# Patient Record
Sex: Female | Born: 1950
Health system: Southern US, Community
[De-identification: ages and names within clinical notes are randomized; demographics above are authoritative.]

## PROBLEM LIST (undated history)

## (undated) DIAGNOSIS — Z87440 Personal history of urinary (tract) infections: Secondary | ICD-10-CM

## (undated) DIAGNOSIS — B029 Zoster without complications: Secondary | ICD-10-CM

## (undated) DIAGNOSIS — E119 Type 2 diabetes mellitus without complications: Secondary | ICD-10-CM

## (undated) DIAGNOSIS — R7989 Other specified abnormal findings of blood chemistry: Secondary | ICD-10-CM

## (undated) DIAGNOSIS — Z9221 Personal history of antineoplastic chemotherapy: Secondary | ICD-10-CM

## (undated) DIAGNOSIS — Z9289 Personal history of other medical treatment: Secondary | ICD-10-CM

## (undated) DIAGNOSIS — K219 Gastro-esophageal reflux disease without esophagitis: Secondary | ICD-10-CM

## (undated) DIAGNOSIS — I10 Essential (primary) hypertension: Secondary | ICD-10-CM

## (undated) DIAGNOSIS — C569 Malignant neoplasm of unspecified ovary: Secondary | ICD-10-CM

## (undated) DIAGNOSIS — C481 Malignant neoplasm of specified parts of peritoneum: Principal | ICD-10-CM

## (undated) DIAGNOSIS — E785 Hyperlipidemia, unspecified: Secondary | ICD-10-CM

## (undated) DIAGNOSIS — Z803 Family history of malignant neoplasm of breast: Secondary | ICD-10-CM

## (undated) DIAGNOSIS — Z8709 Personal history of other diseases of the respiratory system: Secondary | ICD-10-CM

## (undated) DIAGNOSIS — I499 Cardiac arrhythmia, unspecified: Secondary | ICD-10-CM

## (undated) HISTORY — DX: Other specified abnormal findings of blood chemistry: R79.89

## (undated) HISTORY — DX: Type 2 diabetes mellitus without complications: E11.9

## (undated) HISTORY — DX: Hyperlipidemia, unspecified: E78.5

## (undated) HISTORY — DX: Malignant neoplasm of unspecified ovary: C56.9

## (undated) HISTORY — DX: Malignant neoplasm of specified parts of peritoneum: C48.1

## (undated) HISTORY — DX: Essential (primary) hypertension: I10

## (undated) HISTORY — DX: Family history of malignant neoplasm of breast: Z80.3

---

## 1998-05-11 ENCOUNTER — Other Ambulatory Visit: Admission: RE | Admit: 1998-05-11 | Discharge: 1998-05-11 | Payer: Self-pay | Admitting: Family Medicine

## 1999-07-31 ENCOUNTER — Other Ambulatory Visit: Admission: RE | Admit: 1999-07-31 | Discharge: 1999-07-31 | Payer: Self-pay | Admitting: Family Medicine

## 2002-05-25 ENCOUNTER — Other Ambulatory Visit: Admission: RE | Admit: 2002-05-25 | Discharge: 2002-05-25 | Payer: Self-pay | Admitting: Family Medicine

## 2006-07-23 ENCOUNTER — Other Ambulatory Visit: Admission: RE | Admit: 2006-07-23 | Discharge: 2006-07-23 | Payer: Self-pay | Admitting: Family Medicine

## 2013-03-06 ENCOUNTER — Emergency Department (HOSPITAL_COMMUNITY): Payer: BC Managed Care – PPO

## 2013-03-06 ENCOUNTER — Emergency Department (HOSPITAL_COMMUNITY)
Admission: EM | Admit: 2013-03-06 | Discharge: 2013-03-06 | Disposition: A | Discharge status: Home / Self Care | Payer: BC Managed Care – PPO | Attending: Emergency Medicine | Admitting: Emergency Medicine

## 2013-03-06 DIAGNOSIS — IMO0002 Reserved for concepts with insufficient information to code with codable children: Secondary | ICD-10-CM | POA: Insufficient documentation

## 2013-03-06 DIAGNOSIS — R0602 Shortness of breath: Secondary | ICD-10-CM | POA: Insufficient documentation

## 2013-03-06 DIAGNOSIS — T18108A Unspecified foreign body in esophagus causing other injury, initial encounter: Secondary | ICD-10-CM | POA: Insufficient documentation

## 2013-03-06 DIAGNOSIS — Y9389 Activity, other specified: Secondary | ICD-10-CM | POA: Insufficient documentation

## 2013-03-06 DIAGNOSIS — Z794 Long term (current) use of insulin: Secondary | ICD-10-CM | POA: Insufficient documentation

## 2013-03-06 DIAGNOSIS — E1169 Type 2 diabetes mellitus with other specified complication: Secondary | ICD-10-CM | POA: Insufficient documentation

## 2013-03-06 DIAGNOSIS — R1310 Dysphagia, unspecified: Secondary | ICD-10-CM | POA: Insufficient documentation

## 2013-03-06 DIAGNOSIS — Y9289 Other specified places as the place of occurrence of the external cause: Secondary | ICD-10-CM | POA: Insufficient documentation

## 2013-03-06 LAB — POCT I-STAT, CHEM 8
BUN: 14 mg/dL (ref 6–23)
Calcium, Ion: 1.21 mmol/L (ref 1.12–1.23)
Chloride: 105 mEq/L (ref 96–112)
Potassium: 4.1 mEq/L (ref 3.5–5.1)

## 2013-03-06 MED ORDER — GLUCAGON HCL (RDNA) 1 MG IJ SOLR
1.0000 mg | Freq: Once | INTRAMUSCULAR | Status: AC
  Administered 2013-03-06: 1 mg via INTRAVENOUS
  Filled 2013-03-06: qty 1

## 2013-03-06 NOTE — ED Notes (Signed)
Pt comfortable with d/c and f/u instructions. No prescriptions 

## 2013-03-06 NOTE — ED Notes (Signed)
Pt states that earlier tonight she was eating steak and a piece got lodge making it difficulty to breath until she coughed it up, but has not been able to swallow water since because it comes back up. Pt denies difficulty breathing at this time.

## 2013-03-06 NOTE — ED Notes (Signed)
Pt tolerating PO fluids well, no episodes of emesis, no difficulty swallowing

## 2013-03-06 NOTE — ED Provider Notes (Signed)
History     CSN: [id number]  Arrival date & time 03/06/13  0057   None     Chief Complaint  Patient presents with  . Swallowed Foreign Body    (Consider location/radiation/quality/duration/timing/severity/associated sxs/prior treatment) Patient is a 62 y.o. female presenting with foreign body swallowed.  Swallowed Foreign Body Associated symptoms include shortness of breath.   Patient reports she was eating steak at 9:30 PM tonight when this piece of steak got stuck in her throat. This patient points to her manubrium and has foreign by sensation at that level. She's had trouble swallowing since then.. She denies other complaint. No treatment prior to coming here. She was able to cough up part of the steak and had transient after coughing up the steak but no longer feels short of breath No past medical history on file. Past medical history diabetes No past surgical history on file.  No family history on file.  History  Substance Use Topics  . Smoking status: Not on file  . Smokeless tobacco: Not on file  . Alcohol Use: Not on file   Tobacco no alcohol no drug OB History   No data available      Review of Systems  Constitutional: Negative.   HENT: Positive for trouble swallowing.   Respiratory: Positive for shortness of breath.   Cardiovascular: Negative.   Gastrointestinal: Negative.   Musculoskeletal: Negative.   Skin: Negative.   Neurological: Negative.   Psychiatric/Behavioral: Negative.   All other systems reviewed and are negative.    Allergies  Avandia and Micronase  Home Medications   Current Outpatient Rx  Name  Route  Sig  Dispense  Refill  . atorvastatin (LIPITOR) 20 MG tablet   Oral   Take 20 mg by mouth daily.         . benazepril (LOTENSIN) 10 MG tablet   Oral   Take 10 mg by mouth daily.         . insulin lispro (HUMALOG) 100 UNIT/ML injection   Subcutaneous   Inject into the skin continuous.         . metFORMIN (GLUCOPHAGE) 1000  MG tablet   Oral   Take 1,000 mg by mouth 2 (two) times daily with a meal.         . Multiple Vitamin (MULTIVITAMIN WITH MINERALS) TABS   Oral   Take 1 tablet by mouth daily.           BP 119/64  Pulse 87  Temp(Src) 97.7 F (36.5 C) (Oral)  Resp 24  SpO2 96%  Physical Exam  Nursing note and vitals reviewed. Constitutional: She appears well-developed and well-nourished.  HENT:  Head: Normocephalic and atraumatic.  Eyes: Conjunctivae are normal. Pupils are equal, round, and reactive to light.  Neck: Neck supple. No tracheal deviation present. No thyromegaly present.  Cardiovascular: Normal rate and regular rhythm.   No murmur heard. Pulmonary/Chest: Effort normal and breath sounds normal.  Abdominal: Soft. Bowel sounds are normal. She exhibits no distension. There is no tenderness.  Obese  Musculoskeletal: Normal range of motion. She exhibits no edema and no tenderness.  Neurological: She is alert. Coordination normal.  Skin: Skin is warm and dry. No rash noted.  Psychiatric: She has a normal mood and affect.    ED Course  Procedures (including critical care time)  Labs Reviewed - No data to display No results found.  4:30 AM Patient was given a soap and water and vomited the water approximately 1 minute  after she attempted to drink No diagnosis found.  5:25 AM patient feels normal and is asymptomatic after treatment with intravenous glucagon. She is able to drink water without difficulty. Chest x-ray viewed by me Results for orders placed during the hospital encounter of 03/06/13  POCT I-STAT, CHEM 8      Result Value Range   Sodium 139  135 - 145 mEq/L   Potassium 4.1  3.5 - 5.1 mEq/L   Chloride 105  96 - 112 mEq/L   BUN 14  6 - 23 mg/dL   Creatinine, Ser 1.61  0.50 - 1.10 mg/dL   Glucose, Bld 096 (*) 70 - 99 mg/dL   Calcium, Ion 0.45  4.09 - 1.23 mmol/L   TCO2 23  0 - 100 mmol/L   Hemoglobin 13.6  12.0 - 15.0 g/dL   HCT 81.1  91.4 - 78.2 %   Dg Chest  Port 1 View  03/06/2013  *RADIOLOGY REPORT*  Clinical Data: Pain, possible foreign body.  PORTABLE CHEST - 1 VIEW  Comparison: None.  Findings: No radiopaque foreign body.  Lungs predominately clear with mild interstitial prominence which may be accentuated by portable technique/patient body habitus rather than pathology.  No pleural effusion or pneumothorax.  Heart size upper normal. Mediastinal contours otherwise within normal range.  Mild multilevel degenerative change.  IMPRESSION:  Mild interstitial prominence may be accentuated by portable technique/patient body habitus versus atypical/viral infection or interstitial edema.  No focal consolidation.   Original Report Authenticated By: Jearld Lesch, M.D.     MDM  Plan gastroenterology referral.pt reports glucose of 167 is her baseline.   Dx #1 esophageal food impaction #2 hyperglycemia       Doug Sou, MD 03/06/13 [phone]

## 2013-12-21 ENCOUNTER — Encounter: Payer: Self-pay | Admitting: Family Medicine

## 2013-12-21 ENCOUNTER — Ambulatory Visit (INDEPENDENT_AMBULATORY_CARE_PROVIDER_SITE_OTHER): Payer: BC Managed Care – PPO | Admitting: Family Medicine

## 2013-12-21 VITALS — BP 143/72 | HR 85 | Temp 97.6°F | Ht 62.0 in | Wt 213.0 lb

## 2013-12-21 DIAGNOSIS — E785 Hyperlipidemia, unspecified: Secondary | ICD-10-CM

## 2013-12-21 DIAGNOSIS — N951 Menopausal and female climacteric states: Secondary | ICD-10-CM

## 2013-12-21 DIAGNOSIS — Z780 Asymptomatic menopausal state: Secondary | ICD-10-CM

## 2013-12-21 DIAGNOSIS — E119 Type 2 diabetes mellitus without complications: Secondary | ICD-10-CM

## 2013-12-21 DIAGNOSIS — I10 Essential (primary) hypertension: Secondary | ICD-10-CM

## 2013-12-21 LAB — POCT CBC
Granulocyte percent: 79.2 %G (ref 37–80)
HCT, POC: 39 % (ref 37.7–47.9)
Hemoglobin: 12.6 g/dL (ref 12.2–16.2)
Lymph, poc: 0.7 (ref 0.6–3.4)
MCH, POC: 28 pg (ref 27–31.2)
MCHC: 32.2 g/dL (ref 31.8–35.4)
MCV: 87 fL (ref 80–97)
MPV: 8.6 fL (ref 0–99.8)
POC Granulocyte: 4 (ref 2–6.9)
POC LYMPH PERCENT: 14.1 %L (ref 10–50)
Platelet Count, POC: 218 10*3/uL (ref 142–424)
RBC: 4.5 M/uL (ref 4.04–5.48)
RDW, POC: 13.6 %
WBC: 5 10*3/uL (ref 4.6–10.2)

## 2013-12-21 LAB — POCT GLYCOSYLATED HEMOGLOBIN (HGB A1C): Hemoglobin A1C: 8

## 2013-12-21 NOTE — Progress Notes (Signed)
   Subjective:    Patient ID: Ellen Hunt, female    DOB: 12/28/1950, 63 y.o.   MRN: [id number]  HPI  This 63 y.o. female presents for evaluation of re-establish and diabetes.  She has hx of hyperlipidemiai And hypertension.  She has been having difficulties with LEE in the past. She has been seeing endocrinologist and is on ain insulin pump.  She has fsbs fasting in the 150's and 170's.    Review of Systems    No chest pain, SOB, HA, dizziness, vision change, N/V, diarrhea, constipation, dysuria, urinary urgency or frequency, myalgias, arthralgias or rash.  Objective:   Physical Exam  Vital signs noted  Well developed well nourished female.  HEENT - Head atraumatic Normocephalic                Eyes - PERRLA, Conjuctiva - clear Sclera- Clear EOMI                Ears - EAC's Wnl TM's Wnl Gross Hearing WNL                Nose - Nares patent                 Throat - oropharanx wnl Respiratory - Lungs CTA bilateral Cardiac - RRR S1 and S2 without murmur GI - Abdomen soft Nontender and bowel sounds active x 4 Extremities - No edema. Neuro - Grossly intact.      Assessment & Plan:  Diabetes - Plan: POCT glycosylated hemoglobin (Hb A1C), POCT CBC, Lipid panel, TSH, CMP14+EGFR.  Follow up with Tammy Eckard Pharm D.  Other and unspecified hyperlipidemia - Plan: Vit D  25 hydroxy (rtn osteoporosis monitoring)  Unspecified essential hypertension - Plan: POCT CBC, CMP14+EGFR  Menopause - Plan: DG Bone Density  Lysbeth Penner FNP

## 2013-12-22 ENCOUNTER — Other Ambulatory Visit: Payer: Self-pay | Admitting: Family Medicine

## 2013-12-22 LAB — LIPID PANEL
Chol/HDL Ratio: 3.7 ratio units (ref 0.0–4.4)
Cholesterol, Total: 186 mg/dL (ref 100–199)
HDL: 50 mg/dL (ref >39)
LDL Calculated: 104 mg/dL — ABNORMAL HIGH (ref 0–99)
Triglycerides: 159 mg/dL — ABNORMAL HIGH (ref 0–149)
VLDL Cholesterol Cal: 32 mg/dL (ref 5–40)

## 2013-12-22 LAB — CMP14+EGFR
ALT: 26 IU/L (ref 0–32)
AST: 19 IU/L (ref 0–40)
Albumin/Globulin Ratio: 1.9 (ref 1.1–2.5)
Albumin: 4.4 g/dL (ref 3.6–4.8)
Alkaline Phosphatase: 105 IU/L (ref 39–117)
BUN/Creatinine Ratio: 31 — ABNORMAL HIGH (ref 11–26)
BUN: 22 mg/dL (ref 8–27)
CO2: 23 mmol/L (ref 18–29)
Calcium: 9.7 mg/dL (ref 8.7–10.3)
Chloride: 102 mmol/L (ref 97–108)
Creatinine, Ser: 0.72 mg/dL (ref 0.57–1.00)
GFR calc Af Amer: 105 mL/min/{1.73_m2} (ref >59)
GFR calc non Af Amer: 91 mL/min/{1.73_m2} (ref >59)
Globulin, Total: 2.3 g/dL (ref 1.5–4.5)
Glucose: 167 mg/dL — ABNORMAL HIGH (ref 65–99)
Potassium: 4.8 mmol/L (ref 3.5–5.2)
Sodium: 140 mmol/L (ref 134–144)
Total Bilirubin: 0.3 mg/dL (ref 0.0–1.2)
Total Protein: 6.7 g/dL (ref 6.0–8.5)

## 2013-12-22 LAB — VITAMIN D 25 HYDROXY (VIT D DEFICIENCY, FRACTURES): Vit D, 25-Hydroxy: 21.3 ng/mL — ABNORMAL LOW (ref 30.0–100.0)

## 2013-12-22 LAB — TSH: TSH: 0.82 u[IU]/mL (ref 0.450–4.500)

## 2013-12-22 MED ORDER — VITAMIN D (ERGOCALCIFEROL) 1.25 MG (50000 UNIT) PO CAPS: 50000.0000 [IU] | ORAL_CAPSULE | ORAL | Status: DC

## 2014-01-23 ENCOUNTER — Encounter: Payer: Self-pay | Admitting: Family Medicine

## 2014-01-25 ENCOUNTER — Encounter: Payer: Self-pay | Admitting: Pharmacist

## 2014-01-25 ENCOUNTER — Ambulatory Visit (INDEPENDENT_AMBULATORY_CARE_PROVIDER_SITE_OTHER): Payer: BC Managed Care – PPO

## 2014-01-25 ENCOUNTER — Ambulatory Visit (INDEPENDENT_AMBULATORY_CARE_PROVIDER_SITE_OTHER): Payer: BC Managed Care – PPO | Admitting: Pharmacist

## 2014-01-25 VITALS — BP 140/70 | HR 80 | Ht 62.0 in | Wt 214.0 lb

## 2014-01-25 DIAGNOSIS — E669 Obesity, unspecified: Secondary | ICD-10-CM

## 2014-01-25 DIAGNOSIS — E119 Type 2 diabetes mellitus without complications: Secondary | ICD-10-CM

## 2014-01-25 DIAGNOSIS — M949 Disorder of cartilage, unspecified: Secondary | ICD-10-CM

## 2014-01-25 DIAGNOSIS — E118 Type 2 diabetes mellitus with unspecified complications: Secondary | ICD-10-CM | POA: Insufficient documentation

## 2014-01-25 DIAGNOSIS — M8580 Other specified disorders of bone density and structure, unspecified site: Secondary | ICD-10-CM | POA: Insufficient documentation

## 2014-01-25 DIAGNOSIS — E559 Vitamin D deficiency, unspecified: Secondary | ICD-10-CM

## 2014-01-25 DIAGNOSIS — E1169 Type 2 diabetes mellitus with other specified complication: Secondary | ICD-10-CM

## 2014-01-25 DIAGNOSIS — Z9641 Presence of insulin pump (external) (internal): Secondary | ICD-10-CM

## 2014-01-25 DIAGNOSIS — M899 Disorder of bone, unspecified: Secondary | ICD-10-CM

## 2014-01-25 DIAGNOSIS — N951 Menopausal and female climacteric states: Secondary | ICD-10-CM

## 2014-01-25 DIAGNOSIS — Z780 Asymptomatic menopausal state: Secondary | ICD-10-CM

## 2014-01-25 DIAGNOSIS — E785 Hyperlipidemia, unspecified: Secondary | ICD-10-CM

## 2014-01-25 MED ORDER — ROSUVASTATIN CALCIUM 10 MG PO TABS: 10.0000 mg | ORAL_TABLET | Freq: Every day | ORAL | Status: DC

## 2014-01-25 NOTE — Patient Instructions (Signed)
Stop atorvastatin 40mg  Start Crestor 10mg  1 tablet daily (I gave you 20mg  tablets - you can take 1 every other day) - prescription has been sent to [facility]    Hypoglycemia (Low Blood Sugar) Hypoglycemia is when the glucose (sugar) in your blood is too low. Hypoglycemia can happen for many reasons. It can happen to people with or without diabetes. Hypoglycemia can develop quickly and can be a medical emergency.  CAUSES  Having hypoglycemia does not mean that you will develop diabetes. Different causes include:  Missed or delayed meals or not enough carbohydrates eaten.  Medication overdose. This could be by accident or deliberate. If by accident, your medication may need to be adjusted or changed.  Exercise or increased activity without adjustments in carbohydrates or medications.  A nerve disorder that affects body functions like your heart rate, blood pressure and digestion (autonomic neuropathy).  A condition where the stomach muscles do not function properly (gastroparesis). Therefore, medications may not absorb properly.  The inability to recognize the signs of hypoglycemia (hypoglycemic unawareness).  Absorption of insulin  may be altered.  Alcohol consumption.  Pregnancy/menstrual cycles/postpartum. This may be due to hormones.  Certain kinds of tumors. This is very rare. SYMPTOMS   Sweating.  Hunger.  Dizziness.  Blurred vision.  Drowsiness.  Weakness.  Headache.  Rapid heart beat.  Shakiness.  Nervousness. DIAGNOSIS  Diagnosis is made by monitoring blood glucose in one or all of the following ways:  Fingerstick blood glucose monitoring.  Laboratory results. TREATMENT  If you think your blood glucose is low:  Check your blood glucose, if possible. If it is less than 70 mg/dl, take one of the following:  3-4 glucose tablets.   cup juice (prefer clear like apple).   cup "regular" soda pop.  1 cup milk.  -1 tube of glucose gel.  5-6 hard  candies.  Do not over treat because your blood glucose (sugar) will only go too high.  Wait 15 minutes and recheck your blood glucose. If it is still less than 70 mg/dl (or below your target range), repeat treatment.  Eat a snack if it is more than one hour until your next meal. Sometimes, your blood glucose may go so low that you are unable to treat yourself. You may need someone to help you. You may even pass out or be unable to swallow. This may require you to get an injection of glucagon, which raises the blood glucose. HOME CARE INSTRUCTIONS  Check blood glucose as recommended by your caregiver.  Take medication as prescribed by your caregiver.  Follow your meal plan. Do not skip meals. Eat on time.  If you are going to drink alcohol, drink it only with meals.  Check your blood glucose before driving.  Check your blood glucose before and after exercise. If you exercise longer or different than usual, be sure to check blood glucose more frequently.  Always carry treatment with you. Glucose tablets are the easiest to carry.  Always wear medical alert jewelry or carry some form of identification that states that you have diabetes. This will alert people that you have diabetes. If you have hypoglycemia, they will have a better idea on what to do. SEEK MEDICAL CARE IF:   You are having problems keeping your blood sugar at target range.  You are having frequent episodes of hypoglycemia.  You feel you might be having side effects from your medicines.  You have symptoms of an illness that is not improving after  3-4 days.  You notice a change in vision or a new problem with your vision. SEEK IMMEDIATE MEDICAL CARE IF:   You are a family member or friend of a person whose blood glucose goes below 70 mg/dl and is accompanied by:  Confusion.  A change in mental status.  The inability to swallow.  Passing out. Document Released: 10/20/2005 Document Revised: 01/12/2012 Document  Reviewed: 02/16/2012 [facility] Patient Information 2014 [place], [place].

## 2014-01-25 NOTE — Progress Notes (Signed)
Patient ID: Ellen Hunt, female   DOB: [birth year], 63 y.o.   MRN: [id number]   Osteoporosis [facility] Will also address elevated BG and lipids   HPI: First DEXA Does pt already have a diagnosis of:  Osteopenia?  No Osteoporosis?  No Back Pain?  No       Kyphosis?  No Prior fracture?  No Med(s) for Osteoporosis/Osteopenia:  None Med(s) previously tried for Osteoporosis/Osteopenia:  None  Patient is using insulin pump and taking metforin 100mg  BID - suboptimal control She also has elevated Tg and LDL (slightly) dispite compliance with current therapy for hyperlipidmia of atorvastatin 40mg  daily. She feels that leg / muscle pain has increased since atorvastatin increased from 20mg  to 40mg .                                                             PMH: Age at menopause:  89's Hysterectomy?  No Oophorectomy?  No HRT? No Steroid Use?  No Thyroid med?  No History of cancer?  No History of digestive disorders (ie Crohn's)?  No Current or previous eating disorders?  No Last Vitamin D Result:  21.3 (12/22/2013) Last GFR Result:  91 (12/22/2013) A1c = 8.0% (12/21/2013) Current Insulin Pump Settings:        Basal:  MN to 2am = 2.4                    2am to 8am = 3.1                    8am to 5pm = 2.3                    5pm to MN = 2.8       Insulin to CHO ratio = changes between 4 to 5       Insulin Sensitivity = 13       FH/SH: Family history of osteoporosis?  Yes - mother and maternal grandmother Parent with history of hip fracture?  No Family history of breast cancer?  Yes - paternal aunt Exercise?  A little walking at school / work Smoking?  No Alcohol?  No    Calcium Assessment Calcium Intake  # of servings/day  Calcium mg  Milk (8 oz) 0  x  300  = 0  Yogurt (4 oz) 1 x  200 = 200mg   Cheese (1 oz) 0 x  200 = 0  Other Calcium sources   250mg   Ca supplement MVI = 400mg    Estimated calcium intake per day 850mg     DEXA Results Date of Test T-Score for AP Spine L1-L4  T-Score for Total Left Hip T-Score for Total Right Hip  01/25/2014 0.7 0.1 0.3       ** T-Score of neck of  Left hip = -2.4           FRAX 10 year estimate: Total FX risk:  10%  (consider medication if >/= 20%) Hip FX risk:  1.7%  (consider medication if >/= 3%)  Assessment: Uncontrolled type 2DM Osteopenia  Low Vitamin D Hyperlipidemia - not at goals and possibly experiencing SE to atorvastatin  Recommendations: 1.  Changed Insulin Pump Settings:        Basal:  MN to 2am = 2.5  2am to 8am = 3.1                    8am to 5pm = 2.35                    5pm to MN = 2.9       Insulin to CHO ratio = changes between 4 to 5       Insulin Sensitivity = 13  2.  recommend calcium 1200mg  daily through supplementation or diet. Very important to continue Vitamin D supplementation since was found to be low.  3.  recommend weight bearing exercise - 30 minutes at least 4 days per week.   4.  Counseled and educated about fall risk and prevention. 5.  Discontinue atorvastatin and start Crestor 10mg  1 tablet daily  Recheck DEXA:  2 years  Time spent counseling patient:  35 minutes  Cherre Robins, PharmD, CPP

## 2014-04-03 ENCOUNTER — Ambulatory Visit: Payer: BC Managed Care – PPO

## 2014-04-21 ENCOUNTER — Ambulatory Visit (INDEPENDENT_AMBULATORY_CARE_PROVIDER_SITE_OTHER): Payer: BC Managed Care – PPO | Admitting: Pharmacist

## 2014-04-21 ENCOUNTER — Encounter: Payer: Self-pay | Admitting: Pharmacist

## 2014-04-21 VITALS — BP 128/72 | HR 80 | Ht 62.0 in | Wt 213.0 lb

## 2014-04-21 DIAGNOSIS — E1169 Type 2 diabetes mellitus with other specified complication: Secondary | ICD-10-CM

## 2014-04-21 DIAGNOSIS — E669 Obesity, unspecified: Principal | ICD-10-CM

## 2014-04-21 DIAGNOSIS — E785 Hyperlipidemia, unspecified: Secondary | ICD-10-CM

## 2014-04-21 DIAGNOSIS — E119 Type 2 diabetes mellitus without complications: Secondary | ICD-10-CM

## 2014-04-21 DIAGNOSIS — E559 Vitamin D deficiency, unspecified: Secondary | ICD-10-CM

## 2014-04-21 LAB — POCT GLYCOSYLATED HEMOGLOBIN (HGB A1C): HEMOGLOBIN A1C: 8.4

## 2014-04-21 MED ORDER — CANAGLIFLOZIN 100 MG PO TABS: 1.0000 | ORAL_TABLET | Freq: Every day | ORAL | Status: DC

## 2014-04-21 MED ORDER — INSULIN ASPART 100 UNIT/ML ~~LOC~~ SOLN: SUBCUTANEOUS | Status: DC

## 2014-04-21 MED ORDER — BENAZEPRIL HCL 10 MG PO TABS: 10.0000 mg | ORAL_TABLET | Freq: Every day | ORAL | Status: DC

## 2014-04-21 MED ORDER — ASPIRIN 81 MG PO TABS: 81.0000 mg | ORAL_TABLET | Freq: Every day | ORAL | Status: DC

## 2014-04-21 NOTE — Patient Instructions (Signed)
Start Invokana 100mg  take 1 tablet each morning Continue metformin 1000mg  twice a day with food  Canagliflozin oral tablets What is this medicine? CANAGLIFLOZIN (KAN a gli FLOE zin) helps to treat type 2 diabetes. It helps to control blood sugar. Treatment is combined with diet and exercise. This medicine may be used for other purposes; ask your health care provider or pharmacist if you have questions. COMMON BRAND NAME(S): Invokana What should I tell my health care provider before I take this medicine? They need to know if you have any of these conditions: -dehydration -diabetic ketoacidosis -diet low in salt -high cholesterol -high levels of potassium in the blood -history of yeast infection of the penis or vagina -kidney disease -liver disease -low blood pressure -on hemodialysis -type 1 diabetes -uncircumcised female -an unusual or allergic reaction to canagliflozin, other medicines, foods, dyes, or preservatives -pregnant or trying to get pregnant -breast-feeding How should I use this medicine? Take this medicine by mouth with a glass of water. Follow the directions on the prescription label. Take it before the first meal of the day. Take your dose at the same time each day. Do not take more often than directed. Do not stop taking except on your doctor's advice. A special MedGuide will be given to you by the pharmacist with each prescription and refill. Be sure to read this information carefully each time. Talk to your pediatrician regarding the use of this medicine in children. Special care may be needed. Overdosage: If you think you've taken too much of this medicine contact a poison control center or emergency room at once. Overdosage: If you think you have taken too much of this medicine contact a poison control center or emergency room at once. NOTE: This medicine is only for you. Do not share this medicine with others. What if I miss a dose? If you miss a dose, take it as  soon as you can. If it is almost time for your next dose, take only that dose. Do not take double or extra doses. What may interact with this medicine? Do not take this medicine with any of the following medications: -gatifloxacinThis medicine may also interact with the following medications: -alcohol -certain medicines for blood pressure, heart disease -digoxin -diuretics -insulin -nateglinide -phenobarbital -phenytoin -repaglinide -rifampin -ritonavir -sulfonylureas like glimepiride, glipizide, glyburide This list may not describe all possible interactions. Give your health care provider a list of all the medicines, herbs, non-prescription drugs, or dietary supplements you use. Also tell them if you smoke, drink alcohol, or use illegal drugs. Some items may interact with your medicine. What should I watch for while using this medicine? Visit your doctor or health care professional for regular checks on your progress. A test called the HbA1C (A1C) will be monitored. This is a simple blood test. It measures your blood sugar control over the last 2 to 3 months. You will receive this test every 3 to 6 months. Learn how to check your blood sugar. Learn the symptoms of low and high blood sugar and how to manage them. Always carry a quick-source of sugar with you in case you have symptoms of low blood sugar. Examples include hard sugar candy or glucose tablets. Make sure others know that you can choke if you eat or drink when you develop serious symptoms of low blood sugar, such as seizures or unconsciousness. They must get medical help at once. Tell your doctor or health care professional if you have high blood sugar. You might need  to change the dose of your medicine. If you are sick or exercising more than usual, you might need to change the dose of your medicine. Do not skip meals. Ask your doctor or health care professional if you should avoid alcohol. Many nonprescription cough and cold  products contain sugar or alcohol. These can affect blood sugar. Wear a medical ID bracelet or chain, and carry a card that describes your disease and details of your medicine and dosage times. What side effects may I notice from receiving this medicine? Side effects that you should report to your doctor or health care professional as soon as possible: -allergic reactions like skin rash, itching or hives, swelling of the face, lips, or tongue -breathing problems -chest pain -dizziness -fast or irregular heartbeat -feeling faint or lightheaded, falls -fever, chills -muscle weakness -signs and symptoms of low blood sugar such as feeling anxious, confusion, dizziness, increased hunger, unusually weak or tired, sweating, shakiness, cold, irritable, headache, blurred vision, fast heartbeat, loss of consciousness -trouble passing urine or change in the amount of urine -penile discharge, itching, or pain in men -vaginal discharge, itching, or odor in women Side effects that usually do not require medical attention (Report these to your doctor or health care professional if they continue or are bothersome.): -constipation -increased urination -nausea -thirsty This list may not describe all possible side effects. Call your doctor for medical advice about side effects. You may report side effects to FDA at 1-800-FDA-1088. Where should I keep my medicine? Keep out of the reach of children. Store at room temperature between 20 and 25 degrees C (68 and 77 degrees F). Throw away any unused medicine after the expiration date. NOTE: This sheet is a summary. It may not cover all possible information. If you have questions about this medicine, talk to your doctor, pharmacist, or health care provider.  2015, Elsevier/Gold Standard. (2013-02-02 14:08:06)     Hypoglycemia Hypoglycemia occurs when the glucose in your blood is too low. Glucose is a type of sugar that is your body's main energy source.  Hormones, such as insulin and glucagon, control the level of glucose in the blood. Insulin lowers blood glucose and glucagon increases blood glucose. Having too much insulin in your blood stream, or not eating enough food containing sugar, can result in hypoglycemia. Hypoglycemia can happen to people with or without diabetes. It can develop quickly and can be a medical emergency.  CAUSES   Missing or delaying meals.  Not eating enough carbohydrates at meals.  Taking too much diabetes medicine.  Not timing your oral diabetes medicine or insulin doses with meals, snacks, and exercise.  Nausea and vomiting.  Certain medicines.  Severe illnesses, such as hepatitis, kidney disorders, and certain eating disorders.  Increased activity or exercise without eating something extra or adjusting medicines.  Drinking too much alcohol.  A nerve disorder that affects body functions like your heart rate, blood pressure, and digestion (autonomic neuropathy).  A condition where the stomach muscles do not function properly (gastroparesis). Therefore, medicines and food may not absorb properly.  Rarely, a tumor of the pancreas can produce too much insulin. SYMPTOMS   Hunger.  Sweating (diaphoresis).  Change in body temperature.  Shakiness.  Headache.  Anxiety.  Lightheadedness.  Irritability.  Difficulty concentrating.  Dry mouth.  Tingling or numbness in the hands or feet.  Restless sleep or sleep disturbances.  Altered speech and coordination.  Change in mental status.  Seizures or prolonged convulsions.  Combativeness.  Drowsiness (lethargic).  Weakness.  Increased heart rate or palpitations.  Confusion.  Pale, gray skin color.  Blurred or double vision.  Fainting. DIAGNOSIS  A physical exam and medical history will be performed. Your caregiver may make a diagnosis based on your symptoms. Blood tests and other lab tests may be performed to confirm a diagnosis.  Once the diagnosis is made, your caregiver will see if your signs and symptoms go away once your blood glucose is raised.  TREATMENT  Usually, you can easily treat your hypoglycemia when you notice symptoms.  Check your blood glucose. If it is less than 70 mg/dl, take one of the following:   3-4 glucose tablets.    cup juice.    cup regular soda.   1 cup skim milk.   -1 tube of glucose gel.   5-6 hard candies.   Avoid high-fat drinks or food that may delay a rise in blood glucose levels.  Do not take more than the recommended amount of sugary foods, drinks, gel, or tablets. Doing so will cause your blood glucose to go too high.   Wait 10-15 minutes and recheck your blood glucose. If it is still less than 70 mg/dl or below your target range, repeat treatment.   Eat a snack if it is more than 1 hour until your next meal.  There may be a time when your blood glucose may go so low that you are unable to treat yourself at home when you start to notice symptoms. You may need someone to help you. You may even faint or be unable to swallow. If you cannot treat yourself, someone will need to bring you to the hospital.  [place]  If you have diabetes, follow your diabetes management plan by:  Taking your medicines as directed.  Following your exercise plan.  Following your meal plan. Do not skip meals. Eat on time.  Testing your blood glucose regularly. Check your blood glucose before and after exercise. If you exercise longer or different than usual, be sure to check blood glucose more frequently.  Wearing your medical alert jewelry that says you have diabetes.  Identify the cause of your hypoglycemia. Then, develop ways to prevent the recurrence of hypoglycemia.  Do not take a hot bath or shower right after an insulin shot.  Always carry treatment with you. Glucose tablets are the easiest to carry.  If you are going to drink alcohol, drink it only with  meals.  Tell friends or family members ways to keep you safe during a seizure. This may include removing hard or sharp objects from the area or turning you on your side.  Maintain a healthy weight. SEEK MEDICAL CARE IF:   You are having problems keeping your blood glucose in your target range.  You are having frequent episodes of hypoglycemia.  You feel you might be having side effects from your medicines.  You are not sure why your blood glucose is dropping so low.  You notice a change in vision or a new problem with your vision. SEEK IMMEDIATE MEDICAL CARE IF:   Confusion develops.  A change in mental status occurs.  The inability to swallow develops.  Fainting occurs. Document Released: 10/20/2005 Document Revised: 10/25/2013 Document Reviewed: 02/16/2012 [facility] Patient Information 2015 [place], [place]. This information is not intended to replace advice given to you by your health care provider. Make sure you discuss any questions you have with your health care provider.

## 2014-04-21 NOTE — Progress Notes (Signed)
Diabetes Follow-Up Visit Chief Complaint:   Chief Complaint  Patient presents with  . Diabetes     Filed Vitals:   04/21/14 1030  BP: 128/72  Pulse: 80     HPI: patient last seen 3 months ago for uncontrolled DM.  She is currently using medtronic insulin pump Paradigm 722. Uses Novolog insulin in pump.  ALso take metformin 1000mg  bid with food.  She asks today about new medications she has seen advertised on TV recently  Current Insulin Pump Settings:        Basal:  MN to 2am = 2.5                    2am to 8am = 3.1                    8am to 5pm = 2.35                    5pm to MN = 2.9       Insulin to CHO ratio = changes between 4 to 5       Insulin Sensitivity = 13  Home BG Monitoring:  Checking 4 times a day. Average:  233  High: 400  Low:  103  Low fat/carbohydrate diet?  No Nicotine Abuse?  No Medication Compliance?  Yes Exercise?  No Alcohol Abuse?  No  BMI:  Body mass index is 38.95 kg/(m^2).   Weight changes:  stable General Appearance:  obese Mood/Affect:  normal   Lab Results  Component Value Date   HGBA1C 8.4 04/21/2014    No results found for this basenameDerl Barrow    Lab Results  Component Value Date   HDL 50 12/21/2013   LDLCALC 104* 12/21/2013   TRIG 159* 12/21/2013   CHOLHDL 3.7 12/21/2013      Assessment: 1.  Diabetes.  uncontrolled 2.  Blood Pressure.  At goal today 3.  Lipids.  LDL elevated and Tg elevated at last check  Recommendations: 1.  Medication recommendations at this time are as follows:    Add Invokana 100mg  1 table qam  Change Insulin Pump Settings per below         Basal:  MN to 2am = 2.6                    2am to 8am = 3.15                    8am to 5pm = 2.35                    5pm to MN = 2.9       Insulin to CHO ratio = changes between 4 to 5       Insulin Sensitivity = 13  2.  Reviewed HBG goals:  Fasting 80-130 and 1-2 hour post prandial <180.  Patient is instructed to check BG 4 times per day.     3.  BP goal < 140/85. 4.  LDL goal of < 100, HDL > 40 and TG < 150. 5.  Eye Exam yearly and Dental Exam every 6 months. 6.  Dietary recommendations:  Reviewed CHO counting principles and serving size recommendations 7.  Physical Activity recommendations:  Start daily exercise - goal 150 minutes per week 8.  Return to clinic in 4-6 wks - recheck BMET   Time spent counseling patient:  40 minutes  Cherre Robins, PharmD, CPP. CDE

## 2014-04-22 LAB — CMP14+EGFR
A/G RATIO: 1.8 (ref 1.1–2.5)
ALT: 25 IU/L (ref 0–32)
AST: 23 IU/L (ref 0–40)
Albumin: 4.2 g/dL (ref 3.6–4.8)
Alkaline Phosphatase: 95 IU/L (ref 39–117)
BUN/Creatinine Ratio: 26 (ref 11–26)
BUN: 18 mg/dL (ref 8–27)
CALCIUM: 10 mg/dL (ref 8.7–10.3)
CO2: 23 mmol/L (ref 18–29)
Chloride: 99 mmol/L (ref 97–108)
Creatinine, Ser: 0.68 mg/dL (ref 0.57–1.00)
GFR calc Af Amer: 110 mL/min/{1.73_m2} (ref >59)
GFR, EST NON AFRICAN AMERICAN: 95 mL/min/{1.73_m2} (ref >59)
Globulin, Total: 2.3 g/dL (ref 1.5–4.5)
Glucose: 200 mg/dL — ABNORMAL HIGH (ref 65–99)
POTASSIUM: 5.1 mmol/L (ref 3.5–5.2)
SODIUM: 136 mmol/L (ref 134–144)
Total Bilirubin: <0.2 mg/dL (ref 0.0–1.2)
Total Protein: 6.5 g/dL (ref 6.0–8.5)

## 2014-04-22 LAB — NMR, LIPOPROFILE
Cholesterol: 175 mg/dL (ref 100–199)
HDL CHOLESTEROL BY NMR: 52 mg/dL (ref >39)
HDL Particle Number: 40.7 umol/L (ref >=30.5)
LDL Particle Number: 961 nmol/L (ref <1000)
LDL Size: 20 nm (ref >20.5)
LDLC SERPL CALC-MCNC: 71 mg/dL (ref 0–99)
LP-IR Score: 59 — ABNORMAL HIGH (ref <=45)
SMALL LDL PARTICLE NUMBER: 669 nmol/L — AB (ref <=527)
Triglycerides by NMR: 258 mg/dL — ABNORMAL HIGH (ref 0–149)

## 2014-04-22 LAB — MICROALBUMIN, URINE: MICROALBUM., U, RANDOM: 23.3 ug/mL — AB (ref 0.0–17.0)

## 2014-04-22 LAB — VITAMIN D 25 HYDROXY (VIT D DEFICIENCY, FRACTURES): VIT D 25 HYDROXY: 25.8 ng/mL — AB (ref 30.0–100.0)

## 2014-04-28 ENCOUNTER — Ambulatory Visit: Payer: BC Managed Care – PPO

## 2014-05-01 ENCOUNTER — Encounter: Payer: Self-pay | Admitting: Pharmacist

## 2014-05-01 ENCOUNTER — Other Ambulatory Visit: Payer: Self-pay | Admitting: Pharmacist

## 2014-05-01 MED ORDER — VITAMIN D (ERGOCALCIFEROL) 1.25 MG (50000 UNIT) PO CAPS: 50000.0000 [IU] | ORAL_CAPSULE | ORAL | Status: DC

## 2014-05-03 NOTE — Telephone Encounter (Signed)
My Chart message was not read by patient so called with recent lab results.  Discussed results with patient.  She was advised to continue current medications.  Follow up as planned in about 2-4 weeks.

## 2014-05-22 ENCOUNTER — Ambulatory Visit: Payer: Self-pay

## 2014-07-11 ENCOUNTER — Telehealth (HOSPITAL_COMMUNITY): Payer: Self-pay | Admitting: Physical Therapy

## 2014-07-11 ENCOUNTER — Ambulatory Visit (HOSPITAL_COMMUNITY)
Admission: RE | Admit: 2014-07-11 | Payer: BC Managed Care – PPO | Source: Ambulatory Visit | Admitting: Physical Therapy

## 2014-07-13 ENCOUNTER — Telehealth: Payer: Self-pay | Admitting: Pharmacist

## 2014-07-13 MED ORDER — METFORMIN HCL 1000 MG PO TABS: 1000.0000 mg | ORAL_TABLET | Freq: Two times a day (BID) | ORAL | Status: DC

## 2014-07-13 NOTE — Telephone Encounter (Signed)
Looks like metformin was skipped when all other meds refilled.  Rx sent to [facility]  Patient notified.

## 2014-07-24 ENCOUNTER — Telehealth: Payer: Self-pay | Admitting: Family Medicine

## 2014-07-24 MED ORDER — GLUCOSE BLOOD VI STRP: ORAL_STRIP | Status: DC

## 2014-07-24 NOTE — Telephone Encounter (Signed)
Patient was concerned about her Novolog having another physicians name on it.  Our records show that it came from myself and would only have one of our providers name on it.  If it does not then this is something that has gone wrong at the [facility]. Patient also needed rx sent for One Touch Test Strips - done.

## 2014-08-09 ENCOUNTER — Ambulatory Visit (INDEPENDENT_AMBULATORY_CARE_PROVIDER_SITE_OTHER): Payer: BC Managed Care – PPO | Admitting: Family

## 2014-08-09 ENCOUNTER — Encounter: Payer: Self-pay | Admitting: Family

## 2014-08-09 VITALS — BP 163/76 | HR 82 | Temp 98.4°F | Ht 62.0 in | Wt 228.0 lb

## 2014-08-09 DIAGNOSIS — M5432 Sciatica, left side: Secondary | ICD-10-CM

## 2014-08-09 MED ORDER — KETOROLAC TROMETHAMINE 60 MG/2ML IM SOLN
60.0000 mg | Freq: Once | INTRAMUSCULAR | Status: AC
  Administered 2014-08-09: 60 mg via INTRAMUSCULAR

## 2014-08-09 MED ORDER — CYCLOBENZAPRINE HCL 10 MG PO TABS: 10.0000 mg | ORAL_TABLET | Freq: Three times a day (TID) | ORAL | Status: DC | PRN

## 2014-08-09 MED ORDER — MELOXICAM 15 MG PO TABS: 15.0000 mg | ORAL_TABLET | Freq: Every day | ORAL | Status: DC

## 2014-08-09 NOTE — Patient Instructions (Signed)
Sciatica Sciatica is pain, weakness, numbness, or tingling along the path of the sciatic nerve. The nerve starts in the lower back and runs down the back of each leg. The nerve controls the muscles in the lower leg and in the back of the knee, while also providing sensation to the back of the thigh, lower leg, and the sole of your foot. Sciatica is a symptom of another medical condition. For instance, nerve damage or certain conditions, such as a herniated disk or bone spur on the spine, pinch or put pressure on the sciatic nerve. This causes the pain, weakness, or other sensations normally associated with sciatica. Generally, sciatica only affects one side of the body. CAUSES   Herniated or slipped disc.  Degenerative disk disease.  A pain disorder involving the narrow muscle in the buttocks (piriformis syndrome).  Pelvic injury or fracture.  Pregnancy.  Tumor (rare). SYMPTOMS  Symptoms can vary from mild to very severe. The symptoms usually travel from the low back to the buttocks and down the back of the leg. Symptoms can include:  Mild tingling or dull aches in the lower back, leg, or hip.  Numbness in the back of the calf or sole of the foot.  Burning sensations in the lower back, leg, or hip.  Sharp pains in the lower back, leg, or hip.  Leg weakness.  Severe back pain inhibiting movement. These symptoms may get worse with coughing, sneezing, laughing, or prolonged sitting or standing. Also, being overweight may worsen symptoms. DIAGNOSIS  Your caregiver will perform a physical exam to look for common symptoms of sciatica. He or she may ask you to do certain movements or activities that would trigger sciatic nerve pain. Other tests may be performed to find the cause of the sciatica. These may include:  Blood tests.  X-rays.  Imaging tests, such as an MRI or CT scan. TREATMENT  Treatment is directed at the cause of the sciatic pain. Sometimes, treatment is not necessary  and the pain and discomfort goes away on its own. If treatment is needed, your caregiver may suggest:  Over-the-counter medicines to relieve pain.  Prescription medicines, such as anti-inflammatory medicine, muscle relaxants, or narcotics.  Applying heat or ice to the painful area.  Steroid injections to lessen pain, irritation, and inflammation around the nerve.  Reducing activity during periods of pain.  Exercising and stretching to strengthen your abdomen and improve flexibility of your spine. Your caregiver may suggest losing weight if the extra weight makes the back pain worse.  Physical therapy.  Surgery to eliminate what is pressing or pinching the nerve, such as a bone spur or part of a herniated disk. HOME CARE INSTRUCTIONS   Only take over-the-counter or prescription medicines for pain or discomfort as directed by your caregiver.  Apply ice to the affected area for 20 minutes, 3-4 times a day for the first 48-72 hours. Then try heat in the same way.  Exercise, stretch, or perform your usual activities if these do not aggravate your pain.  Attend physical therapy sessions as directed by your caregiver.  Keep all follow-up appointments as directed by your caregiver.  Do not wear high heels or shoes that do not provide proper support.  Check your mattress to see if it is too soft. A firm mattress may lessen your pain and discomfort. SEEK IMMEDIATE MEDICAL CARE IF:   You lose control of your bowel or bladder (incontinence).  You have increasing weakness in the lower back, pelvis, buttocks,   or legs.  You have redness or swelling of your back.  You have a burning sensation when you urinate.  You have pain that gets worse when you lie down or awakens you at night.  Your pain is worse than you have experienced in the past.  Your pain is lasting longer than 4 weeks.  You are suddenly losing weight without reason. MAKE SURE YOU:  Understand these  instructions.  Will watch your condition.  Will get help right away if you are not doing well or get worse. Document Released: 10/14/2001 Document Revised: 04/20/2012 Document Reviewed: 02/29/2012 [employer] Patient Information 2015 [employer]. This information is not intended to replace advice given to you by your health care provider. Make sure you discuss any questions you have with your health care provider.  

## 2014-08-09 NOTE — Progress Notes (Signed)
   Subjective:    Patient ID: Ellen Hunt, female    DOB: Dec 04, 1950, 63 y.o.   MRN: [id number]  Back Pain This is a recurrent problem. The current episode started 1 to 4 weeks ago. The problem occurs constantly. The problem has been waxing and waning since onset. The pain is present in the gluteal. The pain radiates to the right thigh. The pain is at a severity of 10/10. The pain is moderate. The symptoms are aggravated by standing. Associated symptoms include leg pain and tingling. Pertinent negatives include no bladder incontinence, bowel incontinence, dysuria, headaches or numbness. She has tried muscle relaxant, NSAIDs and analgesics for the symptoms. The treatment provided mild relief.      Review of Systems  Constitutional: Negative.   HENT: Negative.   Eyes: Negative.   Respiratory: Negative.  Negative for shortness of breath.   Cardiovascular: Negative.  Negative for palpitations.  Gastrointestinal: Negative.  Negative for bowel incontinence.  Endocrine: Negative.   Genitourinary: Negative.  Negative for bladder incontinence and dysuria.  Musculoskeletal: Positive for back pain.  Neurological: Positive for tingling. Negative for numbness and headaches.  Hematological: Negative.   Psychiatric/Behavioral: Negative.   All other systems reviewed and are negative.      Objective:   Physical Exam  Vitals reviewed. Constitutional: She is oriented to person, place, and time. She appears well-developed and well-nourished. No distress.  Eyes: Pupils are equal, round, and reactive to light.  Neck: Normal range of motion. Neck supple. No thyromegaly present.  Cardiovascular: Normal rate, regular rhythm, normal heart sounds and intact distal pulses.   No murmur heard. Pulmonary/Chest: Effort normal and breath sounds normal. No respiratory distress. She has no wheezes.  Abdominal: Soft. Bowel sounds are normal. She exhibits no distension. There is no tenderness.  Musculoskeletal:  Normal range of motion. She exhibits no edema and no tenderness.  Limited ROM with bending r/t pain   Neurological: She is alert and oriented to person, place, and time. She has normal reflexes. No cranial nerve deficit.  Skin: Skin is warm and dry.  Psychiatric: She has a normal mood and affect. Her behavior is normal. Judgment and thought content normal.    BP 163/76  Pulse 82  Temp(Src) 98.4 F (36.9 C) (Oral)  Ht _0  (1.575 m)  Wt 228 lb (103.42 kg)  BMI 41.69 kg/m2       Assessment & Plan:  1. Sciatica, left -Rest -Ice and heat -Sedation precaution discussed -No other NSAID's while taking Mobic - cyclobenzaprine (FLEXERIL) 10 MG tablet; Take 1 tablet (10 mg total) by mouth 3 (three) times daily as needed for muscle spasms.  Dispense: 30 tablet; Refill: 0 - meloxicam (MOBIC) 15 MG tablet; Take 1 tablet (15 mg total) by mouth daily.  Dispense: 30 tablet; Refill: 0 - ketorolac (TORADOL) injection 60 mg; Inject 2 mLs (60 mg total) into the muscle once. - BMP8+EGFR  Evelina Dun, FNP

## 2014-08-10 ENCOUNTER — Ambulatory Visit: Payer: BC Managed Care – PPO

## 2014-08-11 LAB — BMP8+EGFR
BUN/Creatinine Ratio: 26 (ref 11–26)
BUN: 20 mg/dL (ref 8–27)
CALCIUM: 10.1 mg/dL (ref 8.7–10.3)
CHLORIDE: 96 mmol/L — AB (ref 97–108)
CO2: 23 mmol/L (ref 18–29)
CREATININE: 0.78 mg/dL (ref 0.57–1.00)
GFR calc Af Amer: 95 mL/min/{1.73_m2} (ref >59)
GFR calc non Af Amer: 82 mL/min/{1.73_m2} (ref >59)
Glucose: 243 mg/dL — ABNORMAL HIGH (ref 65–99)
POTASSIUM: 4.7 mmol/L (ref 3.5–5.2)
SODIUM: 137 mmol/L (ref 134–144)

## 2014-08-14 ENCOUNTER — Encounter: Payer: Self-pay | Admitting: Pharmacist

## 2014-08-14 ENCOUNTER — Ambulatory Visit (INDEPENDENT_AMBULATORY_CARE_PROVIDER_SITE_OTHER): Payer: BC Managed Care – PPO | Admitting: Family Medicine

## 2014-08-14 VITALS — BP 135/85 | HR 100 | Temp 96.6°F | Ht 62.0 in | Wt 223.0 lb

## 2014-08-14 DIAGNOSIS — E1169 Type 2 diabetes mellitus with other specified complication: Secondary | ICD-10-CM

## 2014-08-14 DIAGNOSIS — E1165 Type 2 diabetes mellitus with hyperglycemia: Secondary | ICD-10-CM

## 2014-08-14 DIAGNOSIS — E559 Vitamin D deficiency, unspecified: Secondary | ICD-10-CM

## 2014-08-14 DIAGNOSIS — IMO0001 Reserved for inherently not codable concepts without codable children: Secondary | ICD-10-CM

## 2014-08-14 DIAGNOSIS — E785 Hyperlipidemia, unspecified: Secondary | ICD-10-CM

## 2014-08-14 DIAGNOSIS — E119 Type 2 diabetes mellitus without complications: Secondary | ICD-10-CM

## 2014-08-14 DIAGNOSIS — M5430 Sciatica, unspecified side: Secondary | ICD-10-CM

## 2014-08-14 DIAGNOSIS — R809 Proteinuria, unspecified: Secondary | ICD-10-CM

## 2014-08-14 DIAGNOSIS — E669 Obesity, unspecified: Principal | ICD-10-CM

## 2014-08-14 DIAGNOSIS — R635 Abnormal weight gain: Secondary | ICD-10-CM

## 2014-08-14 LAB — POCT UA - MICROALBUMIN: Microalbumin Ur, POC: 20 mg/L

## 2014-08-14 LAB — POCT GLYCOSYLATED HEMOGLOBIN (HGB A1C): Hemoglobin A1C: 8.8

## 2014-08-14 MED ORDER — HYDROCODONE-ACETAMINOPHEN 5-325 MG PO TABS: 1.0000 | ORAL_TABLET | Freq: Four times a day (QID) | ORAL | Status: DC | PRN

## 2014-08-14 NOTE — Addendum Note (Signed)
Addended by: Pollyann Kennedy F on: 08/14/2014 11:59 AM   Modules accepted: Orders

## 2014-08-14 NOTE — Progress Notes (Signed)
Diabetes Follow-Up Visit Chief Complaint:   No chief complaint on file.    There were no vitals filed for this visit.   HPI: patient last seen 3 months ago for uncontrolled DM.  She is currently using medtronic insulin pump Paradigm 722. Uses Novolog insulin in pump.  ALso take metformin 1000mg  bid with food.  She asks today about new medications she has seen advertised on TV recently  Current Insulin Pump Settings:        Basal:  MN to 2am = 2.5                    2am to 8am = 3.1                    8am to 5pm = 2.35                    5pm to MN = 2.9       Insulin to CHO ratio = changes between 4 to 5       Insulin Sensitivity = 13  Home BG Monitoring:  Checking 4 times a day. Average:  233  High: 400  Low:  103  Low fat/carbohydrate diet?  No Nicotine Abuse?  No Medication Compliance?  Yes Exercise?  No Alcohol Abuse?  No  BMI:  There is no weight on file to calculate BMI.   Weight changes:  stable General Appearance:  obese Mood/Affect:  normal   Lab Results  Component Value Date   HGBA1C 8.4 04/21/2014    No results found for this basenameDerl Hunt    Lab Results  Component Value Date   CHOL 175 04/21/2014   HDL 52 04/21/2014   LDLCALC 71 04/21/2014   TRIG 258* 04/21/2014   CHOLHDL 3.7 12/21/2013      Assessment: 1.  Diabetes.  uncontrolled 2.  Blood Pressure.  At goal today 3.  Lipids.  LDL elevated and Tg elevated at last check  Recommendations: 1.  Medication recommendations at this time are as follows:    Add Invokana 100mg  1 table qam  Change Insulin Pump Settings per below         Basal:  MN to 2am = 2.6                    2am to 8am = 3.15                    8am to 5pm = 2.35                    5pm to MN = 2.9       Insulin to CHO ratio = changes between 4 to 5       Insulin Sensitivity = 13  2.  Reviewed HBG goals:  Fasting 80-130 and 1-2 hour post prandial <180.  Patient is instructed to check BG 4 times per day.    3.  BP goal  < 140/85. 4.  LDL goal of < 100, HDL > 40 and TG < 150. 5.  Eye Exam yearly and Dental Exam every 6 months. 6.  Dietary recommendations:  Reviewed CHO counting principles and serving size recommendations 7.  Physical Activity recommendations:  Start daily exercise - goal 150 minutes per week 8.  Return to clinic in 4-6 wks - recheck BMET   Time spent counseling patient:  40 minutes   Cherre Robins, PharmD,  CPP. CDE
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
## 2014-08-15 ENCOUNTER — Other Ambulatory Visit: Payer: Self-pay | Admitting: Pharmacist

## 2014-08-15 LAB — HEPATIC FUNCTION PANEL
ALBUMIN: 4.3 g/dL (ref 3.6–4.8)
ALK PHOS: 91 IU/L (ref 39–117)
ALT: 35 IU/L — ABNORMAL HIGH (ref 0–32)
AST: 35 IU/L (ref 0–40)
BILIRUBIN DIRECT: 0.1 mg/dL (ref 0.00–0.40)
BILIRUBIN TOTAL: 0.3 mg/dL (ref 0.0–1.2)
Total Protein: 6.9 g/dL (ref 6.0–8.5)

## 2014-08-15 LAB — LIPID PANEL
CHOL/HDL RATIO: 3.8 ratio (ref 0.0–4.4)
Cholesterol, Total: 158 mg/dL (ref 100–199)
HDL: 42 mg/dL (ref >39)
LDL Calculated: 66 mg/dL (ref 0–99)
Triglycerides: 248 mg/dL — ABNORMAL HIGH (ref 0–149)
VLDL Cholesterol Cal: 50 mg/dL — ABNORMAL HIGH (ref 5–40)

## 2014-08-15 LAB — MICROALBUMIN / CREATININE URINE RATIO
Creatinine, Ur: 65.9 mg/dL (ref 15.0–278.0)
MICROALB/CREAT RATIO: 13.5 mg/g{creat} (ref 0.0–30.0)
Microalbumin, Urine: 8.9 ug/mL (ref 0.0–17.0)

## 2014-08-15 LAB — VITAMIN D 25 HYDROXY (VIT D DEFICIENCY, FRACTURES): VIT D 25 HYDROXY: 43.8 ng/mL (ref 30.0–100.0)

## 2014-08-15 LAB — LDL CHOLESTEROL, DIRECT: LDL Direct: 79 mg/dL (ref 0–99)

## 2014-08-16 ENCOUNTER — Telehealth: Payer: Self-pay | Admitting: Family Medicine

## 2014-08-16 NOTE — Telephone Encounter (Signed)
Called in,pt aware 

## 2014-08-18 ENCOUNTER — Telehealth: Payer: Self-pay | Admitting: Pharmacist

## 2014-08-18 NOTE — Telephone Encounter (Signed)
A1c and LDL have increased since last checked. Patient had stopped invokana due to concerns with side effects and because she had started low CHO diet.  Need to come in to discuss better BG control and medication management. Tried to call to discuss labs and make appt - no answer / LMOVM.

## 2014-08-18 NOTE — Telephone Encounter (Signed)
Returning TBE call.  Call her back at [phone]

## 2014-08-21 ENCOUNTER — Encounter: Payer: Self-pay | Admitting: Pharmacist

## 2014-08-21 NOTE — Telephone Encounter (Signed)
Patient is really surprised that A1c and Tg is worse.  She has been getting better HBG readings (in the 150's mostly) Appointment made to recheck DM - 09/04/14. Patient to bring in BG readings.

## 2014-09-04 ENCOUNTER — Ambulatory Visit (INDEPENDENT_AMBULATORY_CARE_PROVIDER_SITE_OTHER): Payer: BC Managed Care – PPO | Admitting: Pharmacist

## 2014-09-04 ENCOUNTER — Encounter: Payer: Self-pay | Admitting: Pharmacist

## 2014-09-04 VITALS — BP 148/82 | HR 78 | Ht 62.0 in | Wt 227.0 lb

## 2014-09-04 DIAGNOSIS — I10 Essential (primary) hypertension: Secondary | ICD-10-CM

## 2014-09-04 DIAGNOSIS — E119 Type 2 diabetes mellitus without complications: Secondary | ICD-10-CM

## 2014-09-04 DIAGNOSIS — E669 Obesity, unspecified: Secondary | ICD-10-CM | POA: Insufficient documentation

## 2014-09-04 DIAGNOSIS — E1169 Type 2 diabetes mellitus with other specified complication: Secondary | ICD-10-CM

## 2014-09-04 DIAGNOSIS — E785 Hyperlipidemia, unspecified: Secondary | ICD-10-CM

## 2014-09-04 MED ORDER — VITAMIN D 1000 UNITS PO CAPS: 1000.0000 [IU] | ORAL_CAPSULE | Freq: Every day | ORAL | Status: DC

## 2014-09-04 NOTE — Patient Instructions (Signed)
Diabetes and Standards of Medical Care   Diabetes is complicated. You may find that your diabetes team includes a dietitian, nurse, diabetes educator, eye doctor, and more. To help everyone know what is going on and to help you get the care you deserve, the following schedule of care was developed to help keep you on track. Below are the tests, exams, vaccines, medicines, education, and plans you will need.  Blood Glucose Goals Prior to meals = 80 - 130 Within 2 hours of the start of a meal = less than 180  HbA1c test (goal is less than 7.0% - your last value was 8.8%) This test shows how well you have controlled your glucose over the past 2 to 3 months. It is used to see if your diabetes management plan needs to be adjusted.   It is performed at least 2 times a year if you are meeting treatment goals.  It is performed 4 times a year if therapy has changed or if you are not meeting treatment goals.  Blood pressure test  This test is performed at every routine medical visit. The goal is less than 140/90 mmHg for most people, but 130/80 mmHg in some cases. Ask your health care provider about your goal.  Dental exam  Follow up with the dentist regularly.  Eye exam  If you are diagnosed with type 1 diabetes as a child, get an exam upon reaching the age of 10 years or older and have had diabetes for 3 to 5 years. Yearly eye exams are recommended after that initial eye exam.  If you are diagnosed with type 1 diabetes as an adult, get an exam within 5 years of diagnosis and then yearly.  If you are diagnosed with type 2 diabetes, get an exam as soon as possible after the diagnosis and then yearly.  Foot care exam  Visual foot exams are performed at every routine medical visit. The exams check for cuts, injuries, or other problems with the feet.  A comprehensive foot exam should be done yearly. This includes visual inspection as well as assessing foot pulses and testing for loss of  sensation.  Check your feet nightly for cuts, injuries, or other problems with your feet. Tell your health care provider if anything is not healing.  Kidney function test (urine microalbumin)  This test is performed once a year.  Type 1 diabetes: The first test is performed 5 years after diagnosis.  Type 2 diabetes: The first test is performed at the time of diagnosis.  A serum creatinine and estimated glomerular filtration rate (eGFR) test is done once a year to assess the level of chronic kidney disease (CKD), if present.  Lipid profile (cholesterol, HDL, LDL, triglycerides)  Performed every 5 years for most people.  The goal for LDL is less than 100 mg/dL. If you are at high risk, the goal is less than 70 mg/dL.  The goal for HDL is 40 mg/dL to 50 mg/dL for men and 50 mg/dL to 60 mg/dL for women. An HDL cholesterol of 60 mg/dL or higher gives some protection against heart disease.  The goal for triglycerides is less than 150 mg/dL.  Influenza vaccine, pneumococcal vaccine, and hepatitis B vaccine  The influenza vaccine is recommended yearly.  The pneumococcal vaccine is generally given once in a lifetime. However, there are some instances when another vaccination is recommended. Check with your health care provider.  The hepatitis B vaccine is also recommended for adults with diabetes.    Diabetes self-management education  Education is recommended at diagnosis and ongoing as needed.  Treatment plan  Your treatment plan is reviewed at every medical visit.  Document Released: 08/17/2009 Document Revised: 06/22/2013 Document Reviewed: 03/22/2013 [employer] Patient Information 2014 [employer].   

## 2014-09-04 NOTE — Addendum Note (Signed)
Addended by: Cherre Robins on: 09/04/2014 04:47 PM   Modules accepted: Orders, Medications

## 2014-09-04 NOTE — Progress Notes (Signed)
Diabetes Follow-Up Visit  Chief Complaint:   Chief Complaint  Patient presents with  . Diabetes  . Hyperlipidemia     Filed Vitals:   09/04/14 1235  BP: 148/82  Pulse: 78     HPI: patient last seen 4 months ago for uncontrolled DM.  She is currently using medtronic insulin pump Paradigm 722. Uses Novolog insulin in pump.  Also takes metformin 1000mg  bid with food.  She had been prescribed Invokana 100mg  1 tablet daily about 3 months ago but she stopped after only 1-2 weeks because she "felt funny" and thought it could be invokana.  She reports that her BG was around 140 when she was taking Invokana.  Between now and her last visit she also started a low CHO / high protein diet in which she was drinking a protein shake daily.  She did notice that the protein shake has a lot of CHO's so she has now changed how she prepares the shake and has decreased the amount of fruit she puts in.  Current Insulin Pump Settings:        Basal:  MN to 2am = 2.00                    2am to 8am = 2.15                    8am to 5pm = 2.35                    5pm to MN = 2.50       Insulin to CHO ratio = changes between 4 to 5       Insulin Sensitivity = 13  Average total daily insulin = 115 +/- 18 48% basal  52% bolus  Home BG Monitoring:  Checking 4 to  times a day. Average:  222  High: 400  Low:  118   Low fat/carbohydrate diet?  Yes Nicotine Abuse?  No Medication Compliance?  Yes Exercise?  No Alcohol Abuse?  No  BMI:  Body mass index is 41.51 kg/(m^2).   Weight changes:  Increased 4# since 08/14/2014 General Appearance:  obese Mood/Affect:  normal   Lab Results  Component Value Date   HGBA1C 8.8% 08/14/2014    No results found for: [facility]  Lab Results  Component Value Date   CHOL 175 04/21/2014   HDL 42 08/14/2014   LDLCALC 66 08/14/2014   LDLDIRECT 79 08/14/2014   TRIG 248* 08/14/2014   CHOLHDL 3.8 08/14/2014      Assessment: 1.  Diabetes.  uncontrolled 2.  Blood  Pressure.  Elevated today 3.  Lipids.  LDL at goal and Tg elevated at last check  Recommendations: 1.  Medication recommendations at this time are as follows:    Retry Invokana 100mg  1 table qam  Insulin Pump Settings - Only changed CHO Ration today         Basal:  MN to 2am = 2.6                    2am to 8am = 3.15                    8am to 5pm = 2.35                    5pm to MN = 2.9       Insulin to CHO ratio = changed to 5 all  day       Insulin Sensitivity = 13  2.  Reviewed HBG goals:  Fasting 80-130 and 1-2 hour post prandial <180.  Patient is instructed to check BG 4 to 5 times per day.    3.  BP goal < 140/85. 4.  LDL goal of < 100, HDL > 40 and TG < 150. 5.  Eye Exam yearly and Dental Exam every 6 months. 6.  Dietary recommendations:  Reviewed CHO counting principles and serving size recommendations 7.  Physical Activity recommendations:  Start daily exercise - goal 150 minutes per week 8.  Return to clinic in 4 wks - recheck BMET   Time spent counseling patient:  60 minutes   Cherre Robins, PharmD, CPP. CDE

## 2014-10-03 ENCOUNTER — Telehealth: Payer: Self-pay | Admitting: Family Medicine

## 2014-10-04 ENCOUNTER — Other Ambulatory Visit: Payer: Self-pay | Admitting: Family Medicine

## 2014-10-04 DIAGNOSIS — M5430 Sciatica, unspecified side: Secondary | ICD-10-CM

## 2014-10-04 MED ORDER — HYDROCODONE-ACETAMINOPHEN 5-325 MG PO TABS: 1.0000 | ORAL_TABLET | Freq: Four times a day (QID) | ORAL | Status: DC | PRN

## 2014-10-04 NOTE — Telephone Encounter (Signed)
Please review and advise.

## 2014-10-12 ENCOUNTER — Ambulatory Visit: Payer: Self-pay

## 2014-10-12 ENCOUNTER — Ambulatory Visit (INDEPENDENT_AMBULATORY_CARE_PROVIDER_SITE_OTHER): Payer: BC Managed Care – PPO | Admitting: Pharmacist

## 2014-10-12 ENCOUNTER — Encounter: Payer: Self-pay | Admitting: Pharmacist

## 2014-10-12 VITALS — BP 144/70 | HR 78 | Ht 62.0 in | Wt 223.0 lb

## 2014-10-12 DIAGNOSIS — Z794 Long term (current) use of insulin: Secondary | ICD-10-CM

## 2014-10-12 DIAGNOSIS — E119 Type 2 diabetes mellitus without complications: Secondary | ICD-10-CM

## 2014-10-12 DIAGNOSIS — E1169 Type 2 diabetes mellitus with other specified complication: Secondary | ICD-10-CM

## 2014-10-12 DIAGNOSIS — I10 Essential (primary) hypertension: Secondary | ICD-10-CM

## 2014-10-12 DIAGNOSIS — E785 Hyperlipidemia, unspecified: Secondary | ICD-10-CM

## 2014-10-12 MED ORDER — ATORVASTATIN CALCIUM 40 MG PO TABS: 40.0000 mg | ORAL_TABLET | Freq: Every day | ORAL | Status: DC

## 2014-10-12 MED ORDER — GLUCOSE BLOOD VI STRP: ORAL_STRIP | Status: DC

## 2014-10-12 MED ORDER — METFORMIN HCL 1000 MG PO TABS: 1000.0000 mg | ORAL_TABLET | Freq: Two times a day (BID) | ORAL | Status: DC

## 2014-10-12 MED ORDER — BENAZEPRIL HCL 10 MG PO TABS: 10.0000 mg | ORAL_TABLET | Freq: Every day | ORAL | Status: DC

## 2014-10-12 MED ORDER — CANAGLIFLOZIN 100 MG PO TABS: 100.0000 mg | ORAL_TABLET | Freq: Every day | ORAL | Status: DC

## 2014-10-12 NOTE — Progress Notes (Signed)
Diabetes Follow-Up Visit  Chief Complaint:   Chief Complaint  Patient presents with  . Diabetes     Filed Vitals:   10/12/14 1211  BP: 144/70  Pulse: 78     HPI: patient last seen 1 month ago for uncontrolled DM.  She is currently using medtronic insulin pump Paradigm 722. Uses Novolog insulin in pump.  Also takes metformin 1059m bid with food.  She had been prescribed Invokana 1070m1 tablet daily about 4 months ago but she stopped after only 1-2 weeks because she "felt funny" and thought it could be invokana.  At our last visit she had agreed to retry.    Current Insulin Pump Settings:        Basal:  MN to 2am = 2.00                    2am to 8am = 2.15                    8am to 5pm = 2.35                    5pm to MN = 2.50       Insulin to CHO ratio =  5       Insulin Sensitivity = 13  Average total daily insulin = 107 +/- 21.1 51% basal  49% bolus  Home BG Monitoring:  Checking 4 to  times a day. Average:  211  High: 342  Low:  124   Low fat/carbohydrate diet?  Yes Nicotine Abuse?  No Medication Compliance?  Yes Exercise?  No Alcohol Abuse?  No  BMI:  Body mass index is 40.78 kg/(m^2).    Weight changes:  Decreased 4# since 09/2014 General Appearance:  obese Mood/Affect:  normal   Lab Results  Component Value Date   HGBA1C 8.8% 08/14/2014    [facility]: [facility]  Component Value Date   CHOL 175 04/21/2014   HDL 42 08/14/2014   LDLCALC 66 08/14/2014   LDLDIRECT 79 08/14/2014   TRIG 248* 08/14/2014   CHOLHDL 3.8 08/14/2014      Assessment: 1.  Diabetes.  Uncontrolled but improving 2.  Blood Pressure.  At goal today 3.  Lipids.  LDL at goal and Tg elevated at last check 4.  Obesity - has lost 4# and trying to follow low CHO / caloric diet  Recommendations: 1.  Medication recommendations at this time are as follows:    Continue Invokana 10074m table qam - checking BMET today - may consider increasing to 300m69mily of  serum creatinine stable  Insulin Pump Settings - Only changed CHO Ration today         Basal:  MN to 2am = 2.05                    2am to 8am = 2.20                    8am to 5pm = 2.40                    5pm to MN = 2.55       Insulin to CHO ratio = 5       Insulin Sensitivity = 13  2.  Reviewed HBG goals:  Fasting 80-130 and 1-2 hour post prandial <180.  Patient is instructed to check BG 4 to 5  times per day.    3.  BP goal < 140/85. 4.  LDL goal of < 100, HDL > 40 and TG < 150. 5.  Eye Exam yearly and Dental Exam every 6 months. 6.  Dietary recommendations:  Reviewed CHO counting principles and serving size recommendations 7.  Physical Activity recommendations:  Start daily exercise - goal 150 minutes per week 8.  Return to clinic in 4 wks   Orders Placed This Encounter  Procedures  . BMP8+EGFR    Time spent counseling patient:  40 minutes   Cherre Robins, PharmD, CPP. CDE

## 2014-10-13 LAB — BMP8+EGFR
BUN / CREAT RATIO: 18 (ref 11–26)
BUN: 15 mg/dL (ref 8–27)
CHLORIDE: 101 mmol/L (ref 97–108)
CO2: 22 mmol/L (ref 18–29)
Calcium: 9.7 mg/dL (ref 8.7–10.3)
Creatinine, Ser: 0.85 mg/dL (ref 0.57–1.00)
GFR calc Af Amer: 86 mL/min/{1.73_m2} (ref >59)
GFR, EST NON AFRICAN AMERICAN: 74 mL/min/{1.73_m2} (ref >59)
Glucose: 156 mg/dL — ABNORMAL HIGH (ref 65–99)
POTASSIUM: 4.8 mmol/L (ref 3.5–5.2)
SODIUM: 140 mmol/L (ref 134–144)

## 2014-10-18 ENCOUNTER — Telehealth: Payer: Self-pay | Admitting: Pharmacist

## 2014-10-18 MED ORDER — CANAGLIFLOZIN 100 MG PO TABS
100.0000 mg | ORAL_TABLET | Freq: Every day | ORAL | Status: DC
Start: 2014-10-18 — End: 2014-10-19

## 2014-10-18 NOTE — Telephone Encounter (Signed)
All labs were normal except elevated glucose which was actually improved compared to previous.  Recommend continue Invokana 100mg  1 tablet daily Follow up diabetes in January 2016 Patient notfied of labs and appt

## 2014-10-19 ENCOUNTER — Telehealth: Payer: Self-pay | Admitting: Pharmacist

## 2014-10-19 MED ORDER — CANAGLIFLOZIN 100 MG PO TABS: 100.0000 mg | ORAL_TABLET | Freq: Every day | ORAL | Status: DC

## 2014-10-19 NOTE — Telephone Encounter (Signed)
Rx resent today

## 2014-10-30 ENCOUNTER — Telehealth: Payer: Self-pay | Admitting: Family Medicine

## 2014-10-30 NOTE — Telephone Encounter (Signed)
Patient aware, hydrocodone was filled Dec. 2 and can not be filled again until after Jan 2.

## 2014-11-06 ENCOUNTER — Other Ambulatory Visit: Payer: Self-pay | Admitting: Family Medicine

## 2014-11-06 ENCOUNTER — Telehealth: Payer: Self-pay | Admitting: Family Medicine

## 2014-11-06 DIAGNOSIS — M5430 Sciatica, unspecified side: Secondary | ICD-10-CM

## 2014-11-06 MED ORDER — HYDROCODONE-ACETAMINOPHEN 5-325 MG PO TABS: 1.0000 | ORAL_TABLET | Freq: Four times a day (QID) | ORAL | Status: DC | PRN

## 2014-11-06 NOTE — Telephone Encounter (Signed)
Patient aware to pick up rx.

## 2014-11-06 NOTE — Telephone Encounter (Signed)
Last filled 10/04/14, last seen 08/14/14. Rx will print

## 2014-11-20 ENCOUNTER — Ambulatory Visit: Payer: Self-pay

## 2014-11-30 ENCOUNTER — Ambulatory Visit: Payer: Self-pay

## 2014-12-08 ENCOUNTER — Encounter: Payer: Self-pay | Admitting: Pharmacist

## 2014-12-08 ENCOUNTER — Ambulatory Visit (INDEPENDENT_AMBULATORY_CARE_PROVIDER_SITE_OTHER): Payer: BLUE CROSS/BLUE SHIELD | Admitting: Pharmacist

## 2014-12-08 VITALS — BP 130/70 | HR 66 | Ht 62.0 in | Wt 225.0 lb

## 2014-12-08 DIAGNOSIS — I10 Essential (primary) hypertension: Secondary | ICD-10-CM

## 2014-12-08 DIAGNOSIS — E119 Type 2 diabetes mellitus without complications: Secondary | ICD-10-CM

## 2014-12-08 DIAGNOSIS — Z794 Long term (current) use of insulin: Secondary | ICD-10-CM

## 2014-12-08 LAB — POCT GLYCOSYLATED HEMOGLOBIN (HGB A1C): HEMOGLOBIN A1C: 8.1

## 2014-12-08 LAB — GLUCOSE, POCT (MANUAL RESULT ENTRY): POC GLUCOSE: 130 mg/dL — AB (ref 70–99)

## 2014-12-08 MED ORDER — CANAGLIFLOZIN 300 MG PO TABS: 300.0000 mg | ORAL_TABLET | Freq: Every day | ORAL | Status: DC

## 2014-12-08 NOTE — Progress Notes (Signed)
Diabetes Follow-Up Visit  Chief Complaint:   Chief Complaint  Patient presents with  . Diabetes    Filed Vitals:   12/08/14 1241  BP: 130/70  Pulse: 66   HPI: patient last seen 12 months ago for uncontrolled DM.  She is currently using medtronic insulin pump Paradigm 722. Uses Novolog insulin in pump.  Also takes metformin 1000mg  bid with food and Invokana 100mg  1 tablet daily.  Current Insulin Pump Settings:        Basal:  MN to 2am = 2.00                    2am to 8am = 2.15                    8am to 5pm = 2.35                    5pm to MN = 2.50       Insulin to CHO ratio =  5       Insulin Sensitivity = 13  Patient forgot to put insulin pump back on this am after her shower - not able to run report of HBG readings from pump  Home BG Monitoring:  Checking 4 to  times a day.  Low fat/carbohydrate diet?  Yes Nicotine Abuse?  No Medication Compliance?  Yes Exercise?  No Alcohol Abuse?  No  BMI:  Body mass index is 41.14 kg/(m^2).    Weight changes:  increased 2# since December 2015 General Appearance:  obese Mood/Affect:  normal   Lab Results  Component Value Date   HGBA1C 8.1% 12/08/2014    No results found for: [facility]  Lab Results  Component Value Date   CHOL 175 04/21/2014   HDL 42 08/14/2014   LDLCALC 66 08/14/2014   LDLDIRECT 79 08/14/2014   TRIG 248* 08/14/2014   CHOLHDL 3.8 08/14/2014      Assessment: 1.  Diabetes.  Uncontrolled but improving A1c down from 8.8% to 8.1% 2.  Blood Pressure.  At goal today 3.  Lipids.  LDL at goal and Tg elevated at last check 4.  Obesity - weight has increased 2#  Recommendations: 1.  Medication recommendations at this time are as follows:    Increase Invokana 300mg  1 table qam   Insulin Pump Settings - Only changed CHO Ratio today         Basal:  MN to 2am = 2.05                    2am to 8am = 2.20                    8am to 5pm = 2.40                    5pm to MN = 2.55       Insulin to CHO ratio = 5     Insulin Sensitivity = 13  2.  Reviewed HBG goals:  Fasting 80-130 and 1-2 hour post prandial <180.  Patient is instructed to check BG 4 to 5 times per day.    3.  BP goal < 140/85. 4.  LDL goal of < 100, HDL > 40 and TG < 150. 5.  Dietary recommendations:  Reviewed CHO counting principles and serving size recommendations 6.  Physical Activity recommendations:  Start daily exercise - goal 150 minutes per week 7.  Return to  clinic in 4 wks   Orders Placed This Encounter  Procedures  . POCT glycosylated hemoglobin (Hb A1C)  . POCT glucose (manual entry)    Time spent counseling patient:  40 minutes   Cherre Robins, PharmD, CPP. CDE

## 2014-12-28 ENCOUNTER — Telehealth: Payer: Self-pay | Admitting: Family Medicine

## 2014-12-28 DIAGNOSIS — M5430 Sciatica, unspecified side: Secondary | ICD-10-CM

## 2014-12-29 ENCOUNTER — Other Ambulatory Visit: Payer: Self-pay | Admitting: *Deleted

## 2014-12-29 MED ORDER — HYDROCODONE-ACETAMINOPHEN 5-325 MG PO TABS: 1.0000 | ORAL_TABLET | Freq: Four times a day (QID) | ORAL | Status: DC | PRN

## 2014-12-29 NOTE — Telephone Encounter (Signed)
RX ready to pick up

## 2014-12-29 NOTE — Telephone Encounter (Signed)
Pt called to see if presc. Ready-no presc. Up front.  Call pt at [phone] when ready to pick up today.

## 2014-12-29 NOTE — Telephone Encounter (Signed)
Patient aware script is ready.

## 2015-01-01 ENCOUNTER — Ambulatory Visit: Payer: Self-pay

## 2015-01-08 ENCOUNTER — Ambulatory Visit: Payer: Self-pay

## 2015-02-12 ENCOUNTER — Encounter: Payer: Self-pay | Admitting: Family Medicine

## 2015-02-12 ENCOUNTER — Ambulatory Visit (INDEPENDENT_AMBULATORY_CARE_PROVIDER_SITE_OTHER): Payer: BC Managed Care – PPO | Admitting: Family Medicine

## 2015-02-12 ENCOUNTER — Telehealth: Payer: Self-pay | Admitting: Family Medicine

## 2015-02-12 VITALS — BP 142/71 | HR 87 | Temp 97.2°F | Ht 62.0 in | Wt 223.2 lb

## 2015-02-12 DIAGNOSIS — I10 Essential (primary) hypertension: Secondary | ICD-10-CM

## 2015-02-12 DIAGNOSIS — Z23 Encounter for immunization: Secondary | ICD-10-CM | POA: Diagnosis not present

## 2015-02-12 DIAGNOSIS — Z794 Long term (current) use of insulin: Secondary | ICD-10-CM | POA: Diagnosis not present

## 2015-02-12 DIAGNOSIS — M5431 Sciatica, right side: Secondary | ICD-10-CM

## 2015-02-12 DIAGNOSIS — E559 Vitamin D deficiency, unspecified: Secondary | ICD-10-CM | POA: Diagnosis not present

## 2015-02-12 DIAGNOSIS — R5383 Other fatigue: Secondary | ICD-10-CM | POA: Diagnosis not present

## 2015-02-12 DIAGNOSIS — E119 Type 2 diabetes mellitus without complications: Secondary | ICD-10-CM | POA: Diagnosis not present

## 2015-02-12 DIAGNOSIS — M5430 Sciatica, unspecified side: Secondary | ICD-10-CM | POA: Diagnosis not present

## 2015-02-12 DIAGNOSIS — E785 Hyperlipidemia, unspecified: Secondary | ICD-10-CM

## 2015-02-12 MED ORDER — PREDNISONE 10 MG PO TABS: ORAL_TABLET | ORAL | Status: DC

## 2015-02-12 MED ORDER — HYDROCODONE-ACETAMINOPHEN 5-325 MG PO TABS: 1.0000 | ORAL_TABLET | Freq: Four times a day (QID) | ORAL | Status: DC | PRN

## 2015-02-12 NOTE — Progress Notes (Signed)
Subjective:  Patient ID: Ellen Hunt, female    DOB: November 18, 1950  Age: 63 y.o. MRN: [id number]  CC: Diabetes; Back Pain; Hypertension; and Hyperlipidemia   HPI Ellen Hunt presents for Right sided sciatica severe pain at buttocks and hip on [address] last fall. Two months later  - Jan. Pain started. Getting worse in spite of treatment. Can't go to grocery store, etc. Uses insulin pump so  Ellen Hunt was not prescribed prednisone. No relief with current treatments. Not taking anti-inflammatories either.  History Ellen Hunt has a past medical history of Hypertension; Diabetes mellitus without complication; and Hyperlipidemia.   Ellen Hunt has past surgical history that includes Cesarean section.   Her family history includes Cancer in her father and mother; Diabetes in her paternal grandfather and paternal grandmother.Ellen Hunt reports that Ellen Hunt has never smoked. Ellen Hunt does not have any smokeless tobacco history on file. Ellen Hunt reports that Ellen Hunt does not drink alcohol or use illicit drugs.  Current Outpatient Prescriptions on File Prior to Visit  Medication Sig Dispense Refill  . aspirin 81 MG tablet Take 1 tablet (81 mg total) by mouth daily. 30 tablet   . atorvastatin (LIPITOR) 40 MG tablet Take 1 tablet (40 mg total) by mouth daily at 6 PM. 90 tablet 1  . benazepril (LOTENSIN) 10 MG tablet Take 1 tablet (10 mg total) by mouth daily. 90 tablet 1  . canagliflozin (INVOKANA) 300 MG TABS tablet Take 300 mg by mouth daily before breakfast. 30 tablet 1  . Cholecalciferol (VITAMIN D) 1000 UNITS capsule Take 1 capsule (1,000 Units total) by mouth daily. (Patient taking differently: Take 2,000 Units by mouth daily. )    . glucose blood (ONE TOUCH ULTRA TEST) test strip Use to check blood glucose up to 5 times per day.  Patient uses insulin pump.  Dx:  Insulin treated type 2 diabetes  E11.9 and Z79.4 450 each 1  . insulin aspart (NOVOLOG) 100 UNIT/ML injection Use in insulin pump as directed.  Average daily insulin use is 120  units per day 120 mL 3  . metFORMIN (GLUCOPHAGE) 1000 MG tablet Take 1 tablet (1,000 mg total) by mouth 2 (two) times daily with a meal. 180 tablet 1  . Multiple Vitamin (MULTIVITAMIN WITH MINERALS) TABS Take 1 tablet by mouth daily.     No current facility-administered medications on file prior to visit.    ROS Review of Systems  Constitutional: Negative for fever, chills, diaphoresis, appetite change, fatigue and unexpected weight change.  HENT: Negative for congestion, ear pain, hearing loss, postnasal drip, rhinorrhea, sneezing, sore throat and trouble swallowing.   Eyes: Negative for pain.  Respiratory: Negative for cough, chest tightness and shortness of breath.   Cardiovascular: Negative for chest pain and palpitations.  Gastrointestinal: Negative for nausea, vomiting, abdominal pain, diarrhea and constipation.  Genitourinary: Negative for dysuria, frequency and menstrual problem.  Musculoskeletal: Negative for joint swelling and arthralgias.  Skin: Negative for rash.  Neurological: Negative for dizziness, weakness, numbness and headaches.  Psychiatric/Behavioral: Negative for dysphoric mood and agitation.    Objective:  BP 142/71 mmHg  Pulse 87  Temp(Src) 97.2 F (36.2 C) (Oral)  Ht '5\' 2"'  (1.575 m)  Wt 223 lb 3.2 oz (101.243 kg)  BMI 40.81 kg/m2  BP Readings from Last 3 Encounters:  02/12/15 142/71  12/08/14 130/70  10/12/14 144/70    Wt Readings from Last 3 Encounters:  02/12/15 223 lb 3.2 oz (101.243 kg)  12/08/14 225 lb (102.059 kg)  10/12/14 223 lb (101.152 kg)  Physical Exam  Constitutional: Ellen Hunt is oriented to person, place, and time. Ellen Hunt appears well-developed and well-nourished. No distress.  HENT:  Head: Normocephalic and atraumatic.  Right Ear: External ear normal.  Left Ear: External ear normal.  Nose: Nose normal.  Mouth/Throat: Oropharynx is clear and moist.  Eyes: Conjunctivae and EOM are normal. Pupils are equal, round, and reactive to  light.  Neck: Normal range of motion. Neck supple. No thyromegaly present.  Cardiovascular: Normal rate, regular rhythm and normal heart sounds.   No murmur heard. Pulmonary/Chest: Effort normal and breath sounds normal. No respiratory distress. Ellen Hunt has no wheezes. Ellen Hunt has no rales.  Abdominal: Soft. Bowel sounds are normal. Ellen Hunt exhibits no distension. There is no tenderness.  Musculoskeletal: Ellen Hunt exhibits tenderness (at the buttocks area on the right.).  Lymphadenopathy:    Ellen Hunt has no cervical adenopathy.  Neurological: Ellen Hunt is alert and oriented to person, place, and time. Ellen Hunt has normal reflexes.  Skin: Skin is warm and dry.  Psychiatric: Ellen Hunt has a normal mood and affect. Her behavior is normal. Judgment and thought content normal.    Lab Results  Component Value Date   HGBA1C 8.1% 12/08/2014   HGBA1C 8.8% 08/14/2014   HGBA1C 8.4 04/21/2014    Lab Results  Component Value Date   WBC 5.0 12/21/2013   HGB 12.6 12/21/2013   HCT 39.0 12/21/2013   GLUCOSE 156* 10/12/2014   CHOL 158 08/14/2014   TRIG 248* 08/14/2014   HDL 42 08/14/2014   LDLDIRECT 79 08/14/2014   LDLCALC 66 08/14/2014   ALT 35* 08/14/2014   AST 35 08/14/2014   NA 140 10/12/2014   K 4.8 10/12/2014   CL 101 10/12/2014   CREATININE 0.85 10/12/2014   BUN 15 10/12/2014   CO2 22 10/12/2014   TSH 0.820 12/21/2013   HGBA1C 8.1% 12/08/2014    Dg Chest Port 1 View  03/06/2013   *RADIOLOGY REPORT*  Clinical Data: Pain, possible foreign body.  PORTABLE CHEST - 1 VIEW  Comparison: None.  Findings: No radiopaque foreign body.  Lungs predominately clear with mild interstitial prominence which may be accentuated by portable technique/patient body habitus rather than pathology.  No pleural effusion or pneumothorax.  Heart size upper normal. Mediastinal contours otherwise within normal range.  Mild multilevel degenerative change.  IMPRESSION:  Mild interstitial prominence may be accentuated by portable technique/patient body  habitus versus atypical/viral infection or interstitial edema.  No focal consolidation.   Original Report Authenticated By: Carlos Levering, M.D.    Assessment & Plan:   Promise was seen today for diabetes, back pain, hypertension and hyperlipidemia.  Diagnoses and all orders for this visit:  Sciatica neuralgia, right Orders: -     Ambulatory referral to Physical Therapy  Type 2 diabetes mellitus treated with insulin Orders: -     CMP14+EGFR; Standing -     POCT glycosylated hemoglobin (Hb A1C) -     Lipid panel; Standing  Hyperlipidemia Orders: -     NMR, lipoprofile -     Lipid panel; Standing  Essential hypertension Orders: -     POCT CBC; Standing  Vitamin D insufficiency Orders: -     Vit D  25 hydroxy (rtn osteoporosis monitoring); Standing  Other fatigue Orders: -     POCT CBC; Standing -     Thyroid Panel With TSH  Sciatica, unspecified laterality Orders: -     HYDROcodone-acetaminophen (NORCO) 5-325 MG per tablet; Take 1-2 tablets by mouth every 6 (six) hours as  needed for moderate pain.  Other orders -     Tdap vaccine greater than or equal to 7yo IM -     predniSONE (DELTASONE) 10 MG tablet; Take 5 daily for 3 days followed by 4,3,2 and 1 for 3 days each.   I am having Ellen Hunt start on predniSONE. I am also having her maintain her multivitamin with minerals, insulin aspart, aspirin, Vitamin D, benazepril, metFORMIN, atorvastatin, glucose blood, canagliflozin, and HYDROcodone-acetaminophen.  Meds ordered this encounter  Medications  . predniSONE (DELTASONE) 10 MG tablet    Sig: Take 5 daily for 3 days followed by 4,3,2 and 1 for 3 days each.    Dispense:  45 tablet    Refill:  0  . HYDROcodone-acetaminophen (NORCO) 5-325 MG per tablet    Sig: Take 1-2 tablets by mouth every 6 (six) hours as needed for moderate pain.    Dispense:  60 tablet    Refill:  0     Follow-up: Return in about 2 weeks (around 02/26/2015).  Claretta Fraise, M.D.

## 2015-02-12 NOTE — Telephone Encounter (Signed)
Patient has insulin pump and can bolus for increases in BG.  I recommended that she check BG 4 to 5 times a day while she is taking prednisone.  She is to call me if she has more than 1 BG reading in a week over 200.   We can have her come in for pump adjustment if needed.  appt made for 02/19/15 at 11:50am.

## 2015-02-14 ENCOUNTER — Telehealth: Payer: Self-pay | Admitting: Pharmacist

## 2015-02-15 NOTE — Telephone Encounter (Signed)
Appt made for Friday 02/16/15 at 9am.  Left message on patient's VM

## 2015-02-16 ENCOUNTER — Encounter: Payer: Self-pay | Admitting: Pharmacist

## 2015-02-16 ENCOUNTER — Ambulatory Visit (INDEPENDENT_AMBULATORY_CARE_PROVIDER_SITE_OTHER): Payer: BC Managed Care – PPO | Admitting: Pharmacist

## 2015-02-16 DIAGNOSIS — E119 Type 2 diabetes mellitus without complications: Secondary | ICD-10-CM | POA: Diagnosis not present

## 2015-02-16 DIAGNOSIS — Z794 Long term (current) use of insulin: Secondary | ICD-10-CM | POA: Diagnosis not present

## 2015-02-16 MED ORDER — CANAGLIFLOZIN 300 MG PO TABS: 300.0000 mg | ORAL_TABLET | Freq: Every day | ORAL | Status: DC

## 2015-02-16 MED ORDER — FLUCONAZOLE 150 MG PO TABS: 150.0000 mg | ORAL_TABLET | Freq: Once | ORAL | Status: DC

## 2015-02-16 NOTE — Progress Notes (Signed)
Diabetes Follow-Up Visit  Chief Complaint:   Chief Complaint  Patient presents with  . Diabetes    Filed Vitals:   02/16/15 0931  BP: 132/68  Pulse: 72   HPI: Patient was seen 02/12/2015 for sciatica and was started on prednisone.  She took for 3 days but has stopped because she has had very little sleep since she started.  She also is very concerned about the side effects of prednisone.  She states that her hip pain / sciatica has improved with stretching and hydrocodone.  Patient last seen 2 months ago for uncontrolled DM.  She is currently using medtronic insulin pump Paradigm 722. Uses Novolog insulin in pump.  Also takes metformin 1000mg  bid with food and Invokana 300mg  1 tablet daily.  Patient c/o vaginal discharge and some itching.  Has been using OTC miconazole for symptoms.   Current Insulin Pump Settings:        Basal:  MN to 2am = 2.05                    2am to 8am = 2.20                    8am to 5pm = 2.40                    5pm to MN = 2.55       Insulin to CHO ratio =  5       Insulin Sensitivity = 13  Home BG Monitoring:  Checking 4 to  times a day.  Low fat/carbohydrate diet?  Yes Nicotine Abuse?  No Medication Compliance?  Yes Exercise?  No Alcohol Abuse?  No  BMI:  Body mass index is 40.78 kg/(m^2).    Weight changes:  stable General Appearance:  obese Mood/Affect:  normal   Lab Results  Component Value Date   HGBA1C 8.1% 12/08/2014    No results found for: [facility]  Lab Results  Component Value Date   CHOL 158 08/14/2014   HDL 42 08/14/2014   LDLCALC 66 08/14/2014   LDLDIRECT 79 08/14/2014   TRIG 248* 08/14/2014   CHOLHDL 3.8 08/14/2014      Assessment: 1.  Diabetes.  Uncontrolled but improving  2.  Blood Pressure.  At goal today 3.  Lipids.  LDL at goal and Tg elevated at last check 4.  Obesity - weight is stable 5.  Sciatica - improving  Recommendations: 1.  Medication recommendations at this time are as follows:   Fluconazole  150mg  1 tablet for 1 dose (patient may repeat in 3 days if needed)   Continue Invokana 300mg  1 table qam   Insulin Pump Settings - Only changed CHO Ratio today         Basal:  MN to 2am = 2.10                    2am to 8am = 2.20                    8am to 5pm = 2.45                    5pm to MN = 2.60       Insulin to CHO ratio = 5       Insulin Sensitivity = 13  2.  Reviewed HBG goals:  Fasting 80-130 and 1-2 hour post prandial <180.  Patient is instructed  to check BG 4 to 5 times per day.    3.  BP goal < 140/85. 4.  LDL goal of < 100, HDL > 40 and TG < 150. 5.  Dietary recommendations:  Reviewed CHO counting principles and serving size recommendations 6.  Return to clinic in 4 wks   No orders of the defined types were placed in this encounter.    Time spent counseling patient:  40 minutes   Cherre Robins, PharmD, CPP. CDE

## 2015-02-16 NOTE — Addendum Note (Signed)
Addended by: Cherre Robins on: 02/16/2015 11:01 AM   Modules accepted: Orders

## 2015-02-19 ENCOUNTER — Ambulatory Visit: Payer: Self-pay

## 2015-02-26 ENCOUNTER — Ambulatory Visit: Payer: BC Managed Care – PPO | Attending: Family Medicine | Admitting: Physical Therapy

## 2015-02-26 DIAGNOSIS — M545 Low back pain: Secondary | ICD-10-CM | POA: Diagnosis present

## 2015-02-26 NOTE — Therapy (Signed)
[facility] [place], [place], [address] Phone: [phone]   Fax:  [phone]  Physical Therapy Evaluation  Patient Details  Name: Ellen Hunt MRN: [id number] Date of Birth: 05-May-1951 Referring Provider:  Claretta Fraise, MD  Encounter Date: 02/26/2015      PT End of Session - 02/26/15 1000    Visit Number 1   Number of Visits 12   PT Start Time 0954   PT Stop Time 1042   PT Time Calculation (min) 48 min   Behavior During Therapy [facility] for tasks assessed/performed      Past Medical History  Diagnosis Date  . Hypertension   . Diabetes mellitus without complication   . Hyperlipidemia     Past Surgical History  Procedure Laterality Date  . Cesarean section      There were no vitals filed for this visit.  Visit Diagnosis:  Right low back pain, with sciatica presence unspecified - Plan: PT plan of care cert/re-cert      Subjective Assessment - 02/26/15 1001    Limitations Walking   How long can you walk comfortably? 15 minutes.            [facility] PT Assessment - 02/26/15 0001    Assessment   Medical Diagnosis Right sciatica.   Onset Date --  January 2015.   Precautions   Precautions --  Insulin pump.   Balance Screen   Has the patient fallen in the past 6 months Yes   How many times? 1   Has the patient had a decrease in activity level because of a fear of falling?  No   Is the patient reluctant to leave their home because of a fear of falling?  No   Posture/Postural Control   Posture Comments Ant pelvic tilt.   ROM / Strength   AROM / PROM / Strength AROM;Strength   AROM   Overall AROM Comments Active lumbar extenion= 18 degrees and flexion is nearly full.  Bilateral LE strength= 5/5.   Palpation   Palpation --  Very tender to palpation over right SIJ.   Special Tests    Special Tests --  Diminished right Pat DTR. Neg SLR. Equal leg lengths.    Ambulation/Gait   Ambulation/Gait --   Antalgic gait pattern.                                PT Long Term Goals - 02/26/15 1025    PT LONG TERM GOAL #1   Title Ind with HEP.   Time 6   Period Weeks   Status New   PT LONG TERM GOAL #2   Title Perform ADL's with pain not > 3/10.   Time 6   Period Weeks   Status New   PT LONG TERM GOAL #3   Title Eliminate right LE symptoms.   Time 6   Period Weeks   Status New   PT LONG TERM GOAL #4   Title Walk a normal distnace for shopping wiht pain not > 3/10.               Plan - 02/26/15 1001    Clinical Impression Statement January 2015 got foot caught in purse strap.  Fell on right knee.  Began to experience intense right low back pain with pain into right hip region to level of knee.  Medications reduced pain but then had a massive  flare-up in March of 2016.  Pain is now constant at 7-8/10 and cannot walk long distances.   Pt will benefit from skilled therapeutic intervention in order to improve on the following deficits Pain;Decreased activity tolerance   PT Frequency 2x / week   PT Duration 6 weeks   PT Treatment/Interventions ADLs/Self Care Home Management;Moist Heat;Therapeutic activities;Patient/family education;Therapeutic exercise;Ultrasound;Manual techniques;Electrical Stimulation   PT Next Visit Plan Heat and e'stim; Combo and STW/M to patient's right SIJ region.    Treatment:  Moist heat and E'Stim to right SIJ---Patient tol tx without complaint and it enjoyed.     Problem List Patient Active Problem List   Diagnosis Date Noted  . Sciatica neuralgia 02/12/2015  . Severe obesity (BMI >= 40) 10/12/2014  . BP (high blood pressure) 09/04/2014  . Adiposity 09/04/2014  . Type 2 diabetes mellitus treated with insulin 01/25/2014  . Vitamin D insufficiency 01/25/2014  . Hyperlipidemia 01/25/2014  . Osteopenia 01/25/2014    Yaman Grauberger, [place] MPT 02/26/2015, 10:44 AM  [facility] [address] [place], [place], [address] Phone: [phone]   Fax:  [phone]

## 2015-03-01 ENCOUNTER — Ambulatory Visit: Payer: BC Managed Care – PPO | Admitting: Family Medicine

## 2015-03-02 ENCOUNTER — Ambulatory Visit: Payer: BC Managed Care – PPO | Admitting: Physical Therapy

## 2015-03-02 ENCOUNTER — Encounter: Payer: Self-pay | Admitting: Physical Therapy

## 2015-03-02 DIAGNOSIS — M545 Low back pain: Secondary | ICD-10-CM | POA: Diagnosis not present

## 2015-03-02 NOTE — Therapy (Signed)
[facility] [place], [place], [address] Phone: [phone]   Fax:  [phone]  Physical Therapy Treatment  Patient Details  Name: Ellen Hunt MRN: [id number] Date of Birth: 08-Oct-1951 Referring Provider:  Claretta Fraise, MD  Encounter Date: 03/02/2015      PT End of Session - 03/02/15 0742    Visit Number 2   Number of Visits 12   PT Start Time 0735   PT Stop Time 0822   PT Time Calculation (min) 47 min   Behavior During Therapy [facility] for tasks assessed/performed      Past Medical History  Diagnosis Date  . Hypertension   . Diabetes mellitus without complication   . Hyperlipidemia     Past Surgical History  Procedure Laterality Date  . Cesarean section      There were no vitals filed for this visit.  Visit Diagnosis:  Right low back pain, with sciatica presence unspecified      Subjective Assessment - 03/02/15 0743    Subjective States that since the last treatment she has had increased pain. States that pain has been in R ITB. The pain usually stops at knee region and now has been going into R foot. Was told to begin steroids but she cannot take those.   Limitations Walking   Patient Stated Goals Get out of pain so I can function.   Currently in Pain? Yes   Pain Score 7    Pain Location Leg   Pain Orientation Right;Lateral   Pain Descriptors / Indicators Aching   Pain Frequency Intermittent            [facility] PT Assessment - 03/02/15 0001    Assessment   Medical Diagnosis Right sciatica.                     [facility] Adult PT Treatment/Exercise - 03/02/15 0001    Modalities   Modalities Electrical Stimulation;Moist Heat;Ultrasound   Moist Heat Therapy   Number Minutes Moist Heat 15 Minutes   Moist Heat Location Other (comment)  Lumbar/hip   Electrical Stimulation   Electrical Stimulation Location R ITB   Electrical Stimulation Action Pre-Mod   Electrical Stimulation Parameters  80-150 Hz   Electrical Stimulation Goals Pain   Ultrasound   Ultrasound Location R ITB   Ultrasound Parameters 1.5.w/cm2, 100%, 76mhz    Ultrasound Goals Pain   Manual Therapy   Manual Therapy Myofascial release   Myofascial Release STW/IASTW to R ITB region to decrease tightness and pain                     PT Long Term Goals - 03/02/15 0750    PT LONG TERM GOAL #1   Title Ind with HEP.   Time 6   Period Weeks   Status On-going   PT LONG TERM GOAL #2   Title Perform ADL's with pain not > 3/10.   Time 6   Period Weeks   Status On-going   PT LONG TERM GOAL #3   Title Eliminate right LE symptoms.   Time 6   Period Weeks   Status On-going   PT LONG TERM GOAL #4   Title Walk a normal distnace for shopping wiht pain not > 3/10.   Status On-going               Plan - 03/02/15 0830    Clinical Impression Statement Patient tolerated treatment well without complaint of  increased pain. Tightness noted along patient's R ITB. Tolerated IASTW well without complaint of increased pain. Normal modalities response noted following the removal fo the modaltities. Experienced decreased tightness and denied pain following treatment.   Pt will benefit from skilled therapeutic intervention in order to improve on the following deficits Pain;Decreased activity tolerance   PT Frequency 2x / week   PT Duration 6 weeks   PT Treatment/Interventions ADLs/Self Care Home Management;Moist Heat;Therapeutic activities;Patient/family education;Therapeutic exercise;Ultrasound;Manual techniques;Electrical Stimulation   PT Next Visit Plan Continue per PT POC. Will be going on vacation in 2 weeks and would like an HEP to complete during the vacation.   Consulted and Agree with Plan of Care Patient        Problem List Patient Active Problem List   Diagnosis Date Noted  . Sciatica neuralgia 02/12/2015  . Severe obesity (BMI >= 40) 10/12/2014  . BP (high blood pressure) 09/04/2014  .  Adiposity 09/04/2014  . Type 2 diabetes mellitus treated with insulin 01/25/2014  . Vitamin D insufficiency 01/25/2014  . Hyperlipidemia 01/25/2014  . Osteopenia 01/25/2014    Wynelle Fanny, PTA 03/02/2015, 8:47 AM  [facility] [address] [place], [place], [address] Phone: [phone]   Fax:  [phone]

## 2015-03-07 ENCOUNTER — Ambulatory Visit: Payer: BC Managed Care – PPO | Attending: Family Medicine | Admitting: Physical Therapy

## 2015-03-07 ENCOUNTER — Encounter: Payer: Self-pay | Admitting: Physical Therapy

## 2015-03-07 DIAGNOSIS — M545 Low back pain: Secondary | ICD-10-CM | POA: Diagnosis present

## 2015-03-07 NOTE — Patient Instructions (Signed)
Brushing Teeth    Place one foot on ledge and one hand on counter. Bend other knee slightly to keep back straight.  Copyright  VHI. All rights reserved.  Refrigerator   Squat with knees apart to reach lower shelves and drawers.   Copyright  VHI. All rights reserved.  Laundry [employer] down and hold basket close to stand. Use leg muscles to do the work.   Copyright  VHI. All rights reserved.  Housework - Vacuuming   Hold the vacuum with arm held at side. Step back and forth to move it, keeping head up. Avoid twisting.  Sleeping on Side   Place pillow between knees. Use cervical support under neck and a roll around waist as needed.  Posture - Sitting   Sit upright, head facing forward. Try using a roll to support lower back. Keep shoulders relaxed, and avoid rounded back. Keep hips level with knees. Avoid crossing legs for long periods.

## 2015-03-07 NOTE — Therapy (Signed)
[facility] [place], [place], [address] Phone: [phone]   Fax:  [phone]  Physical Therapy Treatment  Patient Details  Name: Ellen Hunt MRN: [id number] Date of Birth: 09/27/1951 Referring Provider:  Claretta Fraise, MD  Encounter Date: 03/07/2015      PT End of Session - 03/07/15 1005    Visit Number 3   Number of Visits 12   PT Start Time 3762   PT Stop Time 1028   PT Time Calculation (min) 44 min   Activity Tolerance Patient tolerated treatment well   Behavior During Therapy [facility] for tasks assessed/performed      Past Medical History  Diagnosis Date  . Hypertension   . Diabetes mellitus without complication   . Hyperlipidemia     Past Surgical History  Procedure Laterality Date  . Cesarean section      There were no vitals filed for this visit.  Visit Diagnosis:  Right low back pain, with sciatica presence unspecified      Subjective Assessment - 03/07/15 0949    Subjective less radiating pain in right LE today, no heat per patient   Limitations Walking   How long can you walk comfortably? 15 minutes.   Patient Stated Goals Get out of pain so I can function.   Currently in Pain? Yes   Pain Score 5    Pain Location Back   Pain Orientation Right   Pain Descriptors / Indicators Aching;Radiating;Sore   Pain Radiating Towards right hip   Pain Onset More than a month ago   Aggravating Factors  certain movements or increased activity   Pain Relieving Factors rest                         [facility] Adult PT Treatment/Exercise - 03/07/15 0001    Electrical Stimulation   Electrical Stimulation Location rt low back  x74min   Electrical Stimulation Action premod   Electrical Stimulation Parameters 80-150hz    Electrical Stimulation Goals Pain   Ultrasound   Ultrasound Location rt low back SIJ   Ultrasound Parameters 1.5w/cm2/50%/71mhzx10min   Ultrasound Goals Pain   Manual Therapy   Manual Therapy Myofascial release   Myofascial Release IASTW to right low back paraspinals and SIJ area                PT Education - 03/07/15 1005    Education provided Yes   Education Details HEP posture awareness techniques   Person(s) Educated Patient   Methods Explanation;Demonstration;Handout   Comprehension Verbalized understanding;Returned demonstration             PT Long Term Goals - 03/02/15 0750    PT LONG TERM GOAL #1   Title Ind with HEP.   Time 6   Period Weeks   Status On-going   PT LONG TERM GOAL #2   Title Perform ADL's with pain not > 3/10.   Time 6   Period Weeks   Status On-going   PT LONG TERM GOAL #3   Title Eliminate right LE symptoms.   Time 6   Period Weeks   Status On-going   PT LONG TERM GOAL #4   Title Walk a normal distnace for shopping wiht pain not > 3/10.   Status On-going               Plan - 03/07/15 1028    Clinical Impression Statement patient tolerated treatment well today and felt better after  tx. HEP given for posture awareness techniques for ADL's, bending, lifting and sleeping. patient understands techniques to protect back. goals ongoing.   Pt will benefit from skilled therapeutic intervention in order to improve on the following deficits Pain;Decreased activity tolerance   PT Frequency 2x / week   PT Duration 6 weeks   PT Treatment/Interventions ADLs/Self Care Home Management;Moist Heat;Therapeutic activities;Patient/family education;Therapeutic exercise;Ultrasound;Manual techniques;Electrical Stimulation   PT Next Visit Plan Continue per PT POC. Will be going on vacation in 1 weeks/give HEP for draw in, hip bridge, piriformis stretch and SKTC         Problem List Patient Active Problem List   Diagnosis Date Noted  . Sciatica neuralgia 02/12/2015  . Severe obesity (BMI >= 40) 10/12/2014  . BP (high blood pressure) 09/04/2014  . Adiposity 09/04/2014  . Type 2 diabetes mellitus treated with insulin  01/25/2014  . Vitamin D insufficiency 01/25/2014  . Hyperlipidemia 01/25/2014  . Osteopenia 01/25/2014    Cammi Consalvo P, PTA 03/07/2015, 10:33 AM  [facility] [address] [place], [place], [address] Phone: [phone]   Fax:  [phone]

## 2015-03-09 ENCOUNTER — Ambulatory Visit: Payer: BC Managed Care – PPO | Admitting: Physical Therapy

## 2015-03-09 DIAGNOSIS — M545 Low back pain: Secondary | ICD-10-CM

## 2015-03-09 NOTE — Therapy (Signed)
[facility] [place], [place], [address] Phone: [phone]   Fax:  [phone]  Physical Therapy Treatment  Patient Details  Name: Ellen Hunt MRN: [id number] Date of Birth: January 29, 1951 Referring Provider:  Claretta Fraise, MD  Encounter Date: 03/09/2015      PT End of Session - 03/09/15 1046    Visit Number 4   Number of Visits 12   Date for PT Re-Evaluation 04/13/15   PT Start Time 0945   PT Stop Time 1028   PT Time Calculation (min) 43 min   Activity Tolerance Patient tolerated treatment well      Past Medical History  Diagnosis Date  . Hypertension   . Diabetes mellitus without complication   . Hyperlipidemia     Past Surgical History  Procedure Laterality Date  . Cesarean section      There were no vitals filed for this visit.  Visit Diagnosis:  Right low back pain, with sciatica presence unspecified      Subjective Assessment - 03/09/15 1046    Subjective Getting better.  Haven't been up long though.   How long can you walk comfortably? 15 minutes.   Patient Stated Goals Get out of pain so I can function.   Pain Score 3    Pain Location Back   Pain Orientation Right   Pain Onset More than a month ago                         [facility] Adult PT Treatment/Exercise - 03/09/15 0001    Modalities   Modalities Electrical Stimulation;Ultrasound   Electrical Stimulation   Electrical Stimulation Location Pre-mod to right SIJ x 10 minutes constant.   Ultrasound   Ultrasound Location U/S at 1.50 W/CM2 x 12 minutes   Manual Therapy   Manual Therapy Myofascial release   Myofascial Release --  STW/M x 12 minutes                     PT Long Term Goals - 03/09/15 1128    PT LONG TERM GOAL #1   Title Ind with HEP.   Time 6   Period Weeks   Status On-going   PT LONG TERM GOAL #2   Title Perform ADL's with pain not > 3/10.   Time 6   Period Weeks   Status On-going   PT  LONG TERM GOAL #3   Title Eliminate right LE symptoms.   Time 6   Period Weeks   Status On-going   PT LONG TERM GOAL #4   Title Walk a normal distnace for shopping wiht pain not > 3/10.   Status On-going               Plan - 03/09/15 1047    Clinical Impression Statement Patient pleased with progress thus   Pt will benefit from skilled therapeutic intervention in order to improve on the following deficits Pain;Decreased activity tolerance   PT Frequency 2x / week   PT Duration 6 weeks   PT Treatment/Interventions ADLs/Self Care Home Management;Moist Heat;Therapeutic activities;Patient/family education;Therapeutic exercise;Ultrasound;Manual techniques;Electrical Stimulation   Consulted and Agree with Plan of Care Patient        Problem List Patient Active Problem List   Diagnosis Date Noted  . Sciatica neuralgia 02/12/2015  . Severe obesity (BMI >= 40) 10/12/2014  . BP (high blood pressure) 09/04/2014  . Adiposity 09/04/2014  . Type 2 diabetes mellitus treated  with insulin 01/25/2014  . Vitamin D insufficiency 01/25/2014  . Hyperlipidemia 01/25/2014  . Osteopenia 01/25/2014    Desiderio Dolata, [place] MPT 03/09/2015, 11:30 AM  [facility] [address] [place], [place], [address] Phone: [phone]   Fax:  [phone]

## 2015-03-13 ENCOUNTER — Encounter: Payer: Self-pay | Admitting: *Deleted

## 2015-03-13 ENCOUNTER — Ambulatory Visit: Payer: BC Managed Care – PPO | Admitting: *Deleted

## 2015-03-13 DIAGNOSIS — M545 Low back pain: Secondary | ICD-10-CM

## 2015-03-13 NOTE — Therapy (Addendum)
[facility] [place], [place], [address] Phone: [phone]   Fax:  [phone]  Physical Therapy Treatment  Patient Details  Name: Ellen Hunt MRN: [id number] Date of Birth: 05/25/1951 Referring Provider:  Claretta Fraise, MD  Encounter Date: 03/13/2015      PT End of Session - 03/13/15 0949    Visit Number 5   Number of Visits 12   Date for PT Re-Evaluation 04/13/15   PT Start Time 0937   PT Stop Time 1030   PT Time Calculation (min) 53 min      Past Medical History  Diagnosis Date  . Hypertension   . Diabetes mellitus without complication   . Hyperlipidemia     Past Surgical History  Procedure Laterality Date  . Cesarean section      There were no vitals filed for this visit.  Visit Diagnosis:  Right low back pain, with sciatica presence unspecified      Subjective Assessment - 03/13/15 0943    Subjective Doing worse today for some reason. Pain going down RT leg further.   Limitations Walking   How long can you walk comfortably? 15 minutes.   Patient Stated Goals Get out of pain so I can function.   Currently in Pain? Yes   Pain Score 6    Pain Location Back   Pain Orientation Right   Pain Descriptors / Indicators Aching;Radiating;Sore   Pain Onset More than a month ago   Pain Frequency Intermittent   Aggravating Factors  certain Acts, standing,steps   Pain Relieving Factors rest, meds, PT Rxs                         [facility] Adult PT Treatment/Exercise - 03/13/15 0001    Modalities   Modalities Electrical Stimulation;Ultrasound   Electrical Stimulation   Electrical Stimulation Location Pre-mod to right SIJ x 15 minutes constant. in sitting   Ultrasound   Ultrasound Location [place] x10 mins @ 1.5 w/cm2 to RT  SIJ with pt LT sidelying   Manual Therapy   Manual Therapy Myofascial release   Myofascial Release IASTW to right low back paraspinals and SIJ area with Pt LT sidelying                      PT Long Term Goals - 03/09/15 1128    PT LONG TERM GOAL #1   Title Ind with HEP.   Time 6   Period Weeks   Status On-going   PT LONG TERM GOAL #2   Title Perform ADL's with pain not > 3/10.   Time 6   Period Weeks   Status On-going   PT LONG TERM GOAL #3   Title Eliminate right LE symptoms.   Time 6   Period Weeks   Status On-going   PT LONG TERM GOAL #4   Title Walk a normal distnace for shopping wiht pain not > 3/10.   Status On-going               Plan - 03/13/15 1029    Clinical Impression Statement Pt came in with increased pain today for unknown reasons. She did fill better after Rx. pain down to 3-4/10   Pt will benefit from skilled therapeutic intervention in order to improve on the following deficits Pain;Decreased activity tolerance   PT Frequency 2x / week   PT Duration 6 weeks   PT Treatment/Interventions ADLs/Self Care Home  Management;Moist Heat;Therapeutic activities;Patient/family education;Therapeutic exercise;Ultrasound;Manual techniques;Electrical Stimulation   PT Next Visit Plan Continue per PT POC./give HEP for draw in, hip bridge, piriformis stretch and SKTC         Problem List Patient Active Problem List   Diagnosis Date Noted  . Sciatica neuralgia 02/12/2015  . Severe obesity (BMI >= 40) 10/12/2014  . BP (high blood pressure) 09/04/2014  . Adiposity 09/04/2014  . Type 2 diabetes mellitus treated with insulin 01/25/2014  . Vitamin D insufficiency 01/25/2014  . Hyperlipidemia 01/25/2014  . Osteopenia 01/25/2014    RAMSEUR,CHRIS, PTA 03/13/2015, 10:45 AM  [facility] [place], [place], [address] Phone: [phone]   Fax:  [phone]  PHYSICAL THERAPY DISCHARGE SUMMARY  Visits from Start of Care: 5.  Current functional level related to goals / functional outcomes: Please see above.   Remaining deficits: Continued right LE pain.    Education / Equipment: HEP. Plan: Patient agrees to discharge.  Patient goals were not met. Patient is being discharged due to not returning since the last visit.  ?????         [place] Applegate MPT

## 2015-03-16 ENCOUNTER — Encounter: Payer: BC Managed Care – PPO | Admitting: *Deleted

## 2015-03-20 ENCOUNTER — Encounter: Payer: BC Managed Care – PPO | Admitting: Physical Therapy

## 2015-03-22 ENCOUNTER — Encounter: Payer: BC Managed Care – PPO | Admitting: Physical Therapy

## 2015-03-27 ENCOUNTER — Telehealth: Payer: Self-pay | Admitting: Family Medicine

## 2015-03-27 ENCOUNTER — Encounter: Payer: BC Managed Care – PPO | Admitting: Physical Therapy

## 2015-03-27 DIAGNOSIS — M5430 Sciatica, unspecified side: Secondary | ICD-10-CM

## 2015-03-27 NOTE — Telephone Encounter (Signed)
Last seen and filled 02/12/15. Rx will print

## 2015-03-28 NOTE — Telephone Encounter (Signed)
She will have to be seen

## 2015-03-28 NOTE — Telephone Encounter (Signed)
Please review and advise.

## 2015-03-29 ENCOUNTER — Ambulatory Visit (INDEPENDENT_AMBULATORY_CARE_PROVIDER_SITE_OTHER): Payer: BC Managed Care – PPO | Admitting: Pharmacist

## 2015-03-29 ENCOUNTER — Encounter: Payer: Self-pay | Admitting: Pharmacist

## 2015-03-29 ENCOUNTER — Encounter: Payer: BC Managed Care – PPO | Admitting: Physical Therapy

## 2015-03-29 VITALS — BP 132/70 | HR 75 | Ht 62.0 in | Wt 219.0 lb

## 2015-03-29 DIAGNOSIS — E785 Hyperlipidemia, unspecified: Secondary | ICD-10-CM | POA: Diagnosis not present

## 2015-03-29 DIAGNOSIS — I10 Essential (primary) hypertension: Secondary | ICD-10-CM | POA: Diagnosis not present

## 2015-03-29 DIAGNOSIS — Z794 Long term (current) use of insulin: Secondary | ICD-10-CM | POA: Diagnosis not present

## 2015-03-29 DIAGNOSIS — E119 Type 2 diabetes mellitus without complications: Secondary | ICD-10-CM | POA: Diagnosis not present

## 2015-03-29 LAB — POCT GLYCOSYLATED HEMOGLOBIN (HGB A1C): Hemoglobin A1C: 7.5

## 2015-03-29 MED ORDER — ATORVASTATIN CALCIUM 40 MG PO TABS: 40.0000 mg | ORAL_TABLET | Freq: Every day | ORAL | Status: DC

## 2015-03-29 MED ORDER — CANAGLIFLOZIN 300 MG PO TABS: 300.0000 mg | ORAL_TABLET | Freq: Every day | ORAL | Status: DC

## 2015-03-29 NOTE — Patient Instructions (Signed)
Diabetes and Standards of Medical Care   Diabetes is complicated. You may find that your diabetes team includes a dietitian, nurse, diabetes educator, eye doctor, and more. To help everyone know what is going on and to help you get the care you deserve, the following schedule of care was developed to help keep you on track. Below are the tests, exams, vaccines, medicines, education, and plans you will need.  Blood Glucose Goals Prior to meals = 80 - 130 Within 2 hours of the start of a meal = less than 180  HbA1c test (goal is less than 7.0% - your last value was 7.5%) This test shows how well you have controlled your glucose over the past 2 to 3 months. It is used to see if your diabetes management plan needs to be adjusted.   It is performed at least 2 times a year if you are meeting treatment goals.  It is performed 4 times a year if therapy has changed or if you are not meeting treatment goals.  Blood pressure test  This test is performed at every routine medical visit. The goal is less than 140/90 mmHg for most people, but 130/80 mmHg in some cases. Ask your health care provider about your goal.  Dental exam  Follow up with the dentist regularly.  Eye exam  If you are diagnosed with type 1 diabetes as a child, get an exam upon reaching the age of 10 years or older and have had diabetes for 3 to 5 years. Yearly eye exams are recommended after that initial eye exam.  If you are diagnosed with type 1 diabetes as an adult, get an exam within 5 years of diagnosis and then yearly.  If you are diagnosed with type 2 diabetes, get an exam as soon as possible after the diagnosis and then yearly.  Foot care exam  Visual foot exams are performed at every routine medical visit. The exams check for cuts, injuries, or other problems with the feet.  A comprehensive foot exam should be done yearly. This includes visual inspection as well as assessing foot pulses and testing for loss of  sensation.  Check your feet nightly for cuts, injuries, or other problems with your feet. Tell your health care provider if anything is not healing.  Kidney function test (urine microalbumin)  This test is performed once a year.  Type 1 diabetes: The first test is performed 5 years after diagnosis.  Type 2 diabetes: The first test is performed at the time of diagnosis.  A serum creatinine and estimated glomerular filtration rate (eGFR) test is done once a year to assess the level of chronic kidney disease (CKD), if present.  Lipid profile (cholesterol, HDL, LDL, triglycerides)  Performed every 5 years for most people.  The goal for LDL is less than 100 mg/dL. If you are at high risk, the goal is less than 70 mg/dL.  The goal for HDL is 40 mg/dL to 50 mg/dL for men and 50 mg/dL to 60 mg/dL for women. An HDL cholesterol of 60 mg/dL or higher gives some protection against heart disease.  The goal for triglycerides is less than 150 mg/dL.  Influenza vaccine, pneumococcal vaccine, and hepatitis B vaccine  The influenza vaccine is recommended yearly.  The pneumococcal vaccine is generally given once in a lifetime. However, there are some instances when another vaccination is recommended. Check with your health care provider.  The hepatitis B vaccine is also recommended for adults with diabetes.  Diabetes self-management education  Education is recommended at diagnosis and ongoing as needed.  Treatment plan  Your treatment plan is reviewed at every medical visit.  Document Released: 08/17/2009 Document Revised: 06/22/2013 Document Reviewed: 03/22/2013 [employer] Patient Information 2014 [employer].   

## 2015-03-29 NOTE — Progress Notes (Signed)
Diabetes Follow-Up Visit  Chief Complaint:   Chief Complaint  Patient presents with  . Diabetes    Filed Vitals:   03/29/15 0927  BP: 132/70  Pulse: 75   HPI:  Patient last seen 6 weeks ago for uncontrolled DM and insulin pump adjustment.  She is currently using medtronic insulin pump Paradigm 722. Uses Novolog insulin in pump.  Also takes metformin 1053m bid with food and Invokana 3073m1 tablet daily.  SInce taking Invokana regularly she has lost about 6lbs (from 12/2014 until today)  Current Insulin Pump Settings:        Basal:  MN to 2am = 2.10                    2am to 8am = 2.20                    8am to 5pm = 2.45                    5pm to MN = 2.60       Insulin to CHO ratio =  5       Insulin Sensitivity = 13  Home BG Monitoring:  Checking 4 to  times a day. 14 day avg = 161 +/- 32 No hypoglycemia  Average total daily insulin = 95 +/- 13 units  Low fat/carbohydrate diet?  Yes Nicotine Abuse?  No Medication Compliance?  Yes Exercise?  No - patient is having lower back and hip pain.  She is seeing provider for evaluation 04/03/2015. Alcohol Abuse?  No  BMI:  Body mass index is 40.05 kg/(m^2).    Weight changes:  stable General Appearance:  obese Mood/Affect:  normal   Lab Results  Component Value Date   HGBA1C 7.5 03/29/2015    No results found for: [facility]  Component Value Date   CHOL 158 08/14/2014   HDL 42 08/14/2014   LDLCALC 66 08/14/2014   LDLDIRECT 79 08/14/2014   TRIG 248* 08/14/2014   CHOLHDL 3.8 08/14/2014      Assessment: 1.  Diabetes.  A1c improving - down from 8.1% to 7.5% today 2.  Blood Pressure.  At goal today 3.  Lipids.  LDL at goal and Tg elevated at last check - labs pending today 4.  Obesity - weight has decreased - BMI was 41 12/2014 and today BMI is 40.   Recommendations: 1.  Medication recommendations at this time are as follows:   Continue Invokana 30077m table qam   Insulin Pump Settings -           Basal:  MN to 2am = 2.10                    2am to 8am = 2.25                    8am to 5pm = 2.45                    5pm to MN = 2.60       Insulin to CHO ratio = 5       Insulin Sensitivity = 13  2.  Reviewed HBG goals:  Fasting 80-130 and 1-2 hour post prandial <180.  Patient is instructed to check BG 4 to 5 times per day.    3.  BP goal < 140/85. 4.  LDL goal of < 100, HDL >  40 and TG < 150. 5.  Dietary recommendations:  Reviewed CHO counting 6.  Return to clinic in 4 wks   Orders Placed This Encounter  Procedures  . Lipid panel  . CMP14+EGFR  . LDL cholesterol, direct  . POCT glycosylated hemoglobin (Hb A1C)    Time spent counseling patient:  45 minutes   Cherre Robins, PharmD, CPP. CDE

## 2015-03-29 NOTE — Telephone Encounter (Signed)
Pt notified [employer]

## 2015-03-30 LAB — CMP14+EGFR
A/G RATIO: 1.6 (ref 1.1–2.5)
ALT: 21 IU/L (ref 0–32)
AST: 20 IU/L (ref 0–40)
Albumin: 4.5 g/dL (ref 3.6–4.8)
Alkaline Phosphatase: 81 IU/L (ref 39–117)
BILIRUBIN TOTAL: 0.3 mg/dL (ref 0.0–1.2)
BUN / CREAT RATIO: 26 (ref 11–26)
BUN: 23 mg/dL (ref 8–27)
CO2: 22 mmol/L (ref 18–29)
Calcium: 10.2 mg/dL (ref 8.7–10.3)
Chloride: 99 mmol/L (ref 97–108)
Creatinine, Ser: 0.9 mg/dL (ref 0.57–1.00)
GFR calc Af Amer: 80 mL/min/{1.73_m2} (ref >59)
GFR, EST NON AFRICAN AMERICAN: 69 mL/min/{1.73_m2} (ref >59)
GLOBULIN, TOTAL: 2.8 g/dL (ref 1.5–4.5)
GLUCOSE: 145 mg/dL — AB (ref 65–99)
Potassium: 4.7 mmol/L (ref 3.5–5.2)
Sodium: 137 mmol/L (ref 134–144)
TOTAL PROTEIN: 7.3 g/dL (ref 6.0–8.5)

## 2015-03-30 LAB — LIPID PANEL
CHOLESTEROL TOTAL: 129 mg/dL (ref 100–199)
Chol/HDL Ratio: 2.8 ratio units (ref 0.0–4.4)
HDL: 46 mg/dL (ref >39)
LDL Calculated: 52 mg/dL (ref 0–99)
TRIGLYCERIDES: 157 mg/dL — AB (ref 0–149)
VLDL Cholesterol Cal: 31 mg/dL (ref 5–40)

## 2015-03-30 LAB — LDL CHOLESTEROL, DIRECT: LDL DIRECT: 57 mg/dL (ref 0–99)

## 2015-04-03 ENCOUNTER — Ambulatory Visit (INDEPENDENT_AMBULATORY_CARE_PROVIDER_SITE_OTHER): Payer: BC Managed Care – PPO | Admitting: Family Medicine

## 2015-04-03 ENCOUNTER — Encounter: Payer: Self-pay | Admitting: Family Medicine

## 2015-04-03 VITALS — BP 125/68 | HR 85 | Temp 97.5°F | Ht 62.0 in | Wt 218.0 lb

## 2015-04-03 DIAGNOSIS — M5430 Sciatica, unspecified side: Secondary | ICD-10-CM | POA: Diagnosis not present

## 2015-04-03 MED ORDER — HYDROCODONE-ACETAMINOPHEN 5-325 MG PO TABS: 1.0000 | ORAL_TABLET | Freq: Four times a day (QID) | ORAL | Status: DC | PRN

## 2015-04-03 NOTE — Progress Notes (Signed)
Subjective:    Patient ID: Ellen Hunt, female    DOB: 11-14-1950, 64 y.o.   MRN: [id number]  HPI  64 year old female with right hip and leg pain. Symptoms began about one year ago and have been intermittent. She has taken hydrocodone for pain and also undergone physical therapy. There are no functional limitations. The pain does radiate further down her leg now to her foot. She denies back pain. She is a diabetic but has no history of peripheral neuropathy. She was prescribed prednisone but could not take it due to side effects  Patient Active Problem List   Diagnosis Date Noted  . Sciatica neuralgia 02/12/2015  . Severe obesity (BMI >= 40) 10/12/2014  . BP (high blood pressure) 09/04/2014  . Adiposity 09/04/2014  . Type 2 diabetes mellitus treated with insulin 01/25/2014  . Vitamin D insufficiency 01/25/2014  . Hyperlipidemia 01/25/2014  . Osteopenia 01/25/2014   Outpatient Encounter Prescriptions as of 04/03/2015  Medication Sig  . aspirin 81 MG tablet Take 1 tablet (81 mg total) by mouth daily.  Marland Kitchen atorvastatin (LIPITOR) 40 MG tablet Take 1 tablet (40 mg total) by mouth daily at 6 PM.  . benazepril (LOTENSIN) 10 MG tablet Take 1 tablet (10 mg total) by mouth daily.  . canagliflozin (INVOKANA) 300 MG TABS tablet Take 300 mg by mouth daily before breakfast.  . glucose blood (ONE TOUCH ULTRA TEST) test strip Use to check blood glucose up to 5 times per day.  Patient uses insulin pump.  Dx:  Insulin treated type 2 diabetes  E11.9 and Z79.4  . HYDROcodone-acetaminophen (NORCO) 5-325 MG per tablet Take 1-2 tablets by mouth every 6 (six) hours as needed for moderate pain.  Marland Kitchen insulin aspart (NOVOLOG) 100 UNIT/ML injection Use in insulin pump as directed.  Average daily insulin use is 120 units per day  . metFORMIN (GLUCOPHAGE) 1000 MG tablet Take 1 tablet (1,000 mg total) by mouth 2 (two) times daily with a meal.  . Multiple Vitamin (MULTIVITAMIN WITH MINERALS) TABS Take 1 tablet by mouth  daily.  . [DISCONTINUED] Cholecalciferol (VITAMIN D) 1000 UNITS capsule Take 1 capsule (1,000 Units total) by mouth daily.   No facility-administered encounter medications on file as of 04/03/2015.      Review of Systems  Constitutional: Negative.   HENT: Negative.   Respiratory: Negative.   Gastrointestinal: Negative.   Musculoskeletal: Negative.   Psychiatric/Behavioral: Negative.        Objective:   Physical Exam  Constitutional: She is oriented to person, place, and time. She appears well-developed and well-nourished.  Musculoskeletal:  Back is nontender to percussion. Straight leg raising is positive on the right side at 90. There is a definite difference when comparing strength right and left leg as well as patellar reflexes suggesting disease at L4-5  Neurological: She is alert and oriented to person, place, and time.     BP 125/68 mmHg  Pulse 85  Temp(Src) 97.5 F (36.4 C) (Oral)  Ht 5\' 2"  (1.575 m)  Wt 218 lb (98.884 kg)  BMI 39.86 kg/m2      Assessment & Plan:  1. Sciatica, unspecified laterality Suspicion for disc disease. Since we have tried conservative treatment and his symptoms have persisted for up to one year will send for MRI to rule out surgical lesion or disc disease.  - HYDROcodone-acetaminophen (NORCO) 5-325 MG per tablet; Take 1-2 tablets by mouth every 6 (six) hours as needed for moderate pain.  Dispense: 60 tablet; Refill: 0  [name] MD - MR Lumbar Spine Wo Contrast; Future

## 2015-04-12 ENCOUNTER — Encounter: Payer: Self-pay | Admitting: *Deleted

## 2015-04-19 ENCOUNTER — Other Ambulatory Visit: Payer: Self-pay | Admitting: Family Medicine

## 2015-04-19 DIAGNOSIS — M5430 Sciatica, unspecified side: Secondary | ICD-10-CM

## 2015-04-20 MED ORDER — HYDROCODONE-ACETAMINOPHEN 5-325 MG PO TABS: 1.0000 | ORAL_TABLET | Freq: Four times a day (QID) | ORAL | Status: DC | PRN

## 2015-04-20 NOTE — Telephone Encounter (Signed)
Rx filled per patient request 

## 2015-04-20 NOTE — Telephone Encounter (Signed)
Left message to pickup script with photo ID

## 2015-04-21 ENCOUNTER — Other Ambulatory Visit: Payer: BC Managed Care – PPO

## 2015-04-26 ENCOUNTER — Ambulatory Visit
Admission: RE | Admit: 2015-04-26 | Discharge: 2015-04-26 | Disposition: A | Discharge status: Home / Self Care | Payer: BC Managed Care – PPO | Source: Ambulatory Visit | Attending: Family Medicine | Admitting: Family Medicine

## 2015-04-26 DIAGNOSIS — M5430 Sciatica, unspecified side: Secondary | ICD-10-CM

## 2015-04-27 ENCOUNTER — Telehealth: Payer: Self-pay | Admitting: Family Medicine

## 2015-04-27 NOTE — Telephone Encounter (Signed)
error 

## 2015-04-30 ENCOUNTER — Other Ambulatory Visit: Payer: Self-pay | Admitting: *Deleted

## 2015-04-30 DIAGNOSIS — M48061 Spinal stenosis, lumbar region without neurogenic claudication: Secondary | ICD-10-CM

## 2015-05-01 ENCOUNTER — Other Ambulatory Visit: Payer: Self-pay | Admitting: Pharmacist

## 2015-05-09 ENCOUNTER — Telehealth: Payer: Self-pay | Admitting: Family Medicine

## 2015-05-09 ENCOUNTER — Other Ambulatory Visit: Payer: Self-pay

## 2015-05-09 DIAGNOSIS — M5430 Sciatica, unspecified side: Secondary | ICD-10-CM

## 2015-05-09 MED ORDER — HYDROCODONE-ACETAMINOPHEN 5-325 MG PO TABS: 1.0000 | ORAL_TABLET | Freq: Four times a day (QID) | ORAL | Status: DC | PRN

## 2015-05-09 NOTE — Telephone Encounter (Signed)
Last seen 04/03/15 Dr Sabra Heck   If approved print  She said she is taking round the clock now as per your instructions so she needs that many

## 2015-05-09 NOTE — Telephone Encounter (Signed)
Pt aware written Rx is at front desk ready for pickup  

## 2015-05-28 ENCOUNTER — Telehealth: Payer: Self-pay | Admitting: Family Medicine

## 2015-05-28 DIAGNOSIS — M5430 Sciatica, unspecified side: Secondary | ICD-10-CM

## 2015-05-29 MED ORDER — HYDROCODONE-ACETAMINOPHEN 5-325 MG PO TABS: 1.0000 | ORAL_TABLET | Freq: Four times a day (QID) | ORAL | Status: DC | PRN

## 2015-05-29 NOTE — Telephone Encounter (Signed)
Script at front. Message sent to referral to check on appointment for  neurology.

## 2015-05-29 NOTE — Telephone Encounter (Signed)
Rx refilled per patient request 

## 2015-06-14 ENCOUNTER — Other Ambulatory Visit: Payer: Self-pay | Admitting: Family Medicine

## 2015-06-14 ENCOUNTER — Other Ambulatory Visit: Payer: Self-pay | Admitting: Pharmacist

## 2015-06-14 DIAGNOSIS — M5430 Sciatica, unspecified side: Secondary | ICD-10-CM

## 2015-06-14 MED ORDER — HYDROCODONE-ACETAMINOPHEN 5-325 MG PO TABS: 1.0000 | ORAL_TABLET | Freq: Four times a day (QID) | ORAL | Status: DC | PRN

## 2015-06-14 NOTE — Telephone Encounter (Signed)
Up front. Patient aware.

## 2015-06-26 ENCOUNTER — Other Ambulatory Visit: Payer: Self-pay | Admitting: Pharmacist

## 2015-07-02 ENCOUNTER — Encounter: Payer: Self-pay | Admitting: Pharmacist

## 2015-07-02 ENCOUNTER — Telehealth: Payer: Self-pay | Admitting: Pharmacist

## 2015-07-02 ENCOUNTER — Ambulatory Visit (INDEPENDENT_AMBULATORY_CARE_PROVIDER_SITE_OTHER): Payer: BC Managed Care – PPO | Admitting: Pharmacist

## 2015-07-02 VITALS — BP 130/70 | HR 81 | Ht 62.0 in | Wt 220.0 lb

## 2015-07-02 DIAGNOSIS — M5430 Sciatica, unspecified side: Secondary | ICD-10-CM

## 2015-07-02 DIAGNOSIS — Z794 Long term (current) use of insulin: Secondary | ICD-10-CM

## 2015-07-02 DIAGNOSIS — E119 Type 2 diabetes mellitus without complications: Secondary | ICD-10-CM | POA: Diagnosis not present

## 2015-07-02 DIAGNOSIS — I10 Essential (primary) hypertension: Secondary | ICD-10-CM

## 2015-07-02 LAB — POCT GLYCOSYLATED HEMOGLOBIN (HGB A1C): Hemoglobin A1C: 6.9

## 2015-07-02 NOTE — Progress Notes (Signed)
Diabetes Follow-Up Visit  Chief Complaint:   Chief Complaint  Patient presents with  . Diabetes    Filed Vitals:   07/02/15 0914  BP: 130/70  Pulse: 81   Filed Weights   07/02/15 0914  Weight: 220 lb (99.791 kg)   Body mass index is 40.23 kg/(m^2).  HPI:  Patient last seen 12 weeks ago for uncontrolled DM and insulin pump adjustment.  She is currently using medtronic insulin pump Paradigm 722. Uses Novolog insulin in pump.  Also takes metformin 1070m bid with food and Invokana 3015m1 tablet daily.    Current Insulin Pump Settings:        Basal:  MN to 2am = 2.10                    2am to 8am = 2.25                    8am to 5pm = 2.45                    5pm to MN = 2.60       Insulin to CHO ratio =  5       Insulin Sensitivity = 13  Home BG Monitoring:  Checking 4 to  times a day. 14 day avg = 143 +/- 28 No hypoglycemia  Average total daily insulin = 98 +/- 10 units  Low fat/carbohydrate diet?  Yes most days Nicotine Abuse?  No Medication Compliance?  Yes Exercise?  No - patient is awaiting back surgery scheduled for 08/02/2015 with Dr MaGlenna Fellowslcohol Abuse?  No  BMI:  Body mass index is 40.23 kg/(m^2).    Weight changes:  stable General Appearance:  obese Mood/Affect:  normal   Lab Results  Component Value Date   HGBA1C 6.9 07/02/2015    Previous A1c = 7.5% (03/29/2015)      A1c = 8.1% (12/08/2014)  No results found for: [facility]  Component Value Date   CHOL 129 03/29/2015   HDL 46 03/29/2015   LDLCALC 52 03/29/2015   LDLDIRECT 57 03/29/2015   TRIG 157* 03/29/2015   CHOLHDL 2.8 03/29/2015      Assessment: 1.  Diabetes.  A1c improving - down from 7.5 % to 6.9% today 2.  Blood Pressure.  At goal today 3.  Lipids.  LDL at goal and Tg elevated at last check  4.  Obesity - Weight has increased by 1# since our last visit   Recommendations: 1.  Medication recommendations at this time are as follows:   Continue Invokana 30053m  table qam   Insulin Pump Settings -          Basal:  MN to 2am = 2.10                    2am to 8am = 2.25                    8am to 5pm = 2.45                    5pm to MN = 2.60       Insulin to CHO ratio = 5       Insulin Sensitivity = 13  2.  Reviewed HBG goals:  Fasting 80-130 and 1-2 hour post prandial <180.  Patient is instructed to check BG 4 to 5 times per day.  3.  BP goal < 140/85. 4.  LDL goal of < 100, HDL > 40 and TG < 150. 5.  Dietary recommendations:  Reviewed CHO counting 6. Planned PT and exercise after surgery once cleared by surgeon.  7.  Foot exam performed today 8.  Patient reminded to get mammogram and eye exam 9.  Return to clinic in 8 wks   Orders Placed This Encounter  Procedures  . CMP14+EGFR  . POCT glycosylated hemoglobin (Hb A1C)    Time spent counseling patient:  60 minutes   Cherre Robins, PharmD, CPP. CDE

## 2015-07-02 NOTE — Patient Instructions (Addendum)
Stop aspirin and Fish Oil 1 week or 7 days prior to surgery.  Remember to schedule eye exam and mammogram  Diabetes and Standards of Medical Care   Diabetes is complicated. You may find that your diabetes team includes a dietitian, nurse, diabetes educator, eye doctor, and more. To help everyone know what is going on and to help you get the care you deserve, the following schedule of care was developed to help keep you on track. Below are the tests, exams, vaccines, medicines, education, and plans you will need.  Blood Glucose Goals Prior to meals = 80 - 130 Within 2 hours of the start of a meal = less than 180  HbA1c test (goal is less than 7.0% - your last value was %) This test shows how well you have controlled your glucose over the past 2 to 3 months. It is used to see if your diabetes management plan needs to be adjusted.   It is performed at least 2 times a year if you are meeting treatment goals.  It is performed 4 times a year if therapy has changed or if you are not meeting treatment goals.  Blood pressure test  This test is performed at every routine medical visit. The goal is less than 140/90 mmHg for most people, but 130/80 mmHg in some cases. Ask your health care provider about your goal.  Dental exam  Follow up with the dentist regularly.  Eye exam  If you are diagnosed with type 1 diabetes as a child, get an exam upon reaching the age of 33 years or older and have had diabetes for 3 to 5 years. Yearly eye exams are recommended after that initial eye exam.  If you are diagnosed with type 1 diabetes as an adult, get an exam within 5 years of diagnosis and then yearly.  If you are diagnosed with type 2 diabetes, get an exam as soon as possible after the diagnosis and then yearly.  Foot care exam  Visual foot exams are performed at every routine medical visit. The exams check for cuts, injuries, or other problems with the feet.  A comprehensive foot exam should be  done yearly. This includes visual inspection as well as assessing foot pulses and testing for loss of sensation.  Check your feet nightly for cuts, injuries, or other problems with your feet. Tell your health care provider if anything is not healing.  Kidney function test (urine microalbumin)  This test is performed once a year.  Type 1 diabetes: The first test is performed 5 years after diagnosis.  Type 2 diabetes: The first test is performed at the time of diagnosis.  A serum creatinine and estimated glomerular filtration rate (eGFR) test is done once a year to assess the level of chronic kidney disease (CKD), if present.  Lipid profile (cholesterol, HDL, LDL, triglycerides)  Performed every 5 years for most people.  The goal for LDL is less than 100 mg/dL. If you are at high risk, the goal is less than 70 mg/dL.  The goal for HDL is 40 mg/dL to 50 mg/dL for men and 50 mg/dL to 60 mg/dL for women. An HDL cholesterol of 60 mg/dL or higher gives some protection against heart disease.  The goal for triglycerides is less than 150 mg/dL.  Influenza vaccine, pneumococcal vaccine, and hepatitis B vaccine  The influenza vaccine is recommended yearly.  The pneumococcal vaccine is generally given once in a lifetime. However, there are some instances when another  vaccination is recommended. Check with your health care provider.  The hepatitis B vaccine is also recommended for adults with diabetes.  Diabetes self-management education  Education is recommended at diagnosis and ongoing as needed.  Treatment plan  Your treatment plan is reviewed at every medical visit.  Document Released: 08/17/2009 Document Revised: 06/22/2013 Document Reviewed: 03/22/2013 [facility] Patient Information 2014 [place].

## 2015-07-03 LAB — CMP14+EGFR
ALT: 21 IU/L (ref 0–32)
AST: 21 IU/L (ref 0–40)
Albumin/Globulin Ratio: 1.9 (ref 1.1–2.5)
Albumin: 4.6 g/dL (ref 3.6–4.8)
Alkaline Phosphatase: 65 IU/L (ref 39–117)
BUN/Creatinine Ratio: 19 (ref 11–26)
BUN: 17 mg/dL (ref 8–27)
Bilirubin Total: 0.3 mg/dL (ref 0.0–1.2)
CALCIUM: 10 mg/dL (ref 8.7–10.3)
CO2: 23 mmol/L (ref 18–29)
CREATININE: 0.89 mg/dL (ref 0.57–1.00)
Chloride: 98 mmol/L (ref 97–108)
GFR calc Af Amer: 80 mL/min/{1.73_m2} (ref >59)
GFR calc non Af Amer: 70 mL/min/{1.73_m2} (ref >59)
GLUCOSE: 109 mg/dL — AB (ref 65–99)
Globulin, Total: 2.4 g/dL (ref 1.5–4.5)
Potassium: 4.6 mmol/L (ref 3.5–5.2)
Sodium: 139 mmol/L (ref 134–144)
Total Protein: 7 g/dL (ref 6.0–8.5)

## 2015-07-03 MED ORDER — HYDROCODONE-ACETAMINOPHEN 5-325 MG PO TABS: 1.0000 | ORAL_TABLET | Freq: Four times a day (QID) | ORAL | Status: DC | PRN

## 2015-07-03 NOTE — Addendum Note (Signed)
Addended by: Ilean China on: 07/03/2015 02:54 PM   Modules accepted: Orders

## 2015-07-03 NOTE — Telephone Encounter (Signed)
lmovm that written Rx is at front desk ready for pickup 

## 2015-07-03 NOTE — Telephone Encounter (Signed)
Rx refilled per patient request 

## 2015-07-26 ENCOUNTER — Other Ambulatory Visit: Payer: Self-pay | Admitting: Pharmacist

## 2015-08-03 ENCOUNTER — Other Ambulatory Visit: Payer: Self-pay | Admitting: Family Medicine

## 2015-08-03 DIAGNOSIS — M5430 Sciatica, unspecified side: Secondary | ICD-10-CM

## 2015-08-06 ENCOUNTER — Telehealth: Payer: Self-pay | Admitting: Family Medicine

## 2015-08-06 MED ORDER — HYDROCODONE-ACETAMINOPHEN 5-325 MG PO TABS: 1.0000 | ORAL_TABLET | Freq: Four times a day (QID) | ORAL | Status: DC | PRN

## 2015-08-06 NOTE — Telephone Encounter (Signed)
Last filled 07/03/15, last seen 07/02/15. [place] pt

## 2015-08-10 ENCOUNTER — Telehealth: Payer: Self-pay | Admitting: Pharmacist

## 2015-08-10 NOTE — Telephone Encounter (Signed)
Patient was unable to locate 2017 formulary and I was unable to access it on website.  She is going to try to look on patient portal to see what suggested alternatives would be.   We do have have 3 months.  Probably alternative is Jardiance.  Will continue to work on this.

## 2015-08-21 HISTORY — PX: LUMBAR FUSION: SHX111

## 2015-09-07 ENCOUNTER — Telehealth: Payer: Self-pay | Admitting: Pharmacist

## 2015-09-07 MED ORDER — FLUCONAZOLE 150 MG PO TABS: ORAL_TABLET | ORAL | Status: DC

## 2015-09-07 NOTE — Telephone Encounter (Signed)
Please give this patient a prescription for Diflucan 150 1 stat and one again in 1 week #2 pills

## 2015-09-07 NOTE — Telephone Encounter (Signed)
Pt aware  Med sent in

## 2015-09-19 ENCOUNTER — Other Ambulatory Visit: Payer: Self-pay | Admitting: Family Medicine

## 2015-09-19 NOTE — Telephone Encounter (Signed)
Last seen 04/03/15  Dr Sabra Heck

## 2015-09-21 ENCOUNTER — Other Ambulatory Visit: Payer: Self-pay | Admitting: *Deleted

## 2015-09-21 MED ORDER — BENAZEPRIL HCL 10 MG PO TABS: 10.0000 mg | ORAL_TABLET | Freq: Every day | ORAL | Status: DC

## 2015-09-26 NOTE — Telephone Encounter (Signed)
Patient aware, med called in

## 2015-10-03 ENCOUNTER — Ambulatory Visit (INDEPENDENT_AMBULATORY_CARE_PROVIDER_SITE_OTHER): Payer: BC Managed Care – PPO | Admitting: Family Medicine

## 2015-10-03 ENCOUNTER — Other Ambulatory Visit: Payer: Self-pay | Admitting: *Deleted

## 2015-10-03 ENCOUNTER — Encounter: Payer: Self-pay | Admitting: Family Medicine

## 2015-10-03 ENCOUNTER — Other Ambulatory Visit: Payer: Self-pay | Admitting: Family Medicine

## 2015-10-03 VITALS — BP 137/81 | HR 87 | Temp 97.3°F | Ht 62.0 in | Wt 224.0 lb

## 2015-10-03 DIAGNOSIS — E119 Type 2 diabetes mellitus without complications: Secondary | ICD-10-CM

## 2015-10-03 DIAGNOSIS — Z794 Long term (current) use of insulin: Secondary | ICD-10-CM | POA: Diagnosis not present

## 2015-10-03 DIAGNOSIS — E785 Hyperlipidemia, unspecified: Secondary | ICD-10-CM

## 2015-10-03 LAB — POCT GLYCOSYLATED HEMOGLOBIN (HGB A1C): HEMOGLOBIN A1C: 6.8

## 2015-10-03 MED ORDER — FUROSEMIDE 20 MG PO TABS: 20.0000 mg | ORAL_TABLET | Freq: Every day | ORAL | Status: DC

## 2015-10-03 NOTE — Progress Notes (Signed)
   Subjective:    Patient ID: Ellen Hunt, female    DOB: [birth year], 64 y.o.   MRN: [id number]  HPI 64 year old female here to follow-up her diabetes. She had back surgery since her last visit to see me. Her pain has greatly improved she still not back to full activity and wears a brace. Her diabetes has been controlled with insulin pump. She has had the pump now about 810 years. She is also followed by her clinical pharmacologist. She has been told that [place] will no longer be available on her insurance after the first of the year.  Patient Active Problem List   Diagnosis Date Noted  . Sciatica neuralgia 02/12/2015  . Severe obesity (BMI >= 40) ([place]) 10/12/2014  . BP (high blood pressure) 09/04/2014  . Adiposity 09/04/2014  . Type 2 diabetes mellitus treated with insulin ([place]) 01/25/2014  . Vitamin D insufficiency 01/25/2014  . Hyperlipidemia 01/25/2014  . Osteopenia 01/25/2014   Outpatient Encounter Prescriptions as of 10/03/2015  Medication Sig  . aspirin 81 MG tablet Take 1 tablet (81 mg total) by mouth daily.  Marland Kitchen atorvastatin (LIPITOR) 40 MG tablet Take 1 tablet (40 mg total) by mouth daily at 6 PM.  . benazepril (LOTENSIN) 10 MG tablet TAKE 1 TABLET BY MOUTH EVERY DAY  . benazepril (LOTENSIN) 10 MG tablet Take 1 tablet (10 mg total) by mouth daily.  . canagliflozin (INVOKANA) 300 MG TABS tablet Take 300 mg by mouth daily before breakfast.  . fluconazole (DIFLUCAN) 150 MG tablet Repeat again in 1 week PRN.  . furosemide (LASIX) 20 MG tablet Take 1 tablet by mouth daily.  Marland Kitchen HYDROcodone-acetaminophen (NORCO) 5-325 MG tablet Take 1-2 tablets by mouth every 6 (six) hours as needed for moderate pain.  . metFORMIN (GLUCOPHAGE) 1000 MG tablet TAKE 1 TABLET BY MOUTH TWICE DAILY WITH A MEAL  . Multiple Vitamin (MULTIVITAMIN WITH MINERALS) TABS Take 1 tablet by mouth daily.  Marland Kitchen NOVOLOG 100 UNIT/ML injection USE IN INSULIN PUMP AS DIRECTED  . ONE TOUCH ULTRA TEST test strip USE TO CHECK  BLOOD SUGAR UP TO 5 TIMES A DAY   No facility-administered encounter medications on file as of 10/03/2015.      Review of Systems  Constitutional: Negative.   Respiratory: Negative.   Cardiovascular: Negative.   Genitourinary: Negative.   Neurological: Negative.        Objective:   Physical Exam  Constitutional: She is oriented to person, place, and time. She appears well-developed and well-nourished.  HENT:  Head: Normocephalic.  Cardiovascular: Normal rate and regular rhythm.   Pulmonary/Chest: Effort normal and breath sounds normal.  Musculoskeletal: Normal range of motion.  Neurological: She is alert and oriented to person, place, and time.          Assessment & Plan:  1. Type 2 diabetes mellitus treated with insulin (HCC) Well managed on insulin pump metformin and add Invokanna, for the time being - POCT glycosylated hemoglobin (Hb A1C) - Microalbumin / creatinine urine ratio  2. Hyperlipidemia Patient takes atorvastatin. Lipids were last checked in May and LDL was at goal at 57 - POCT glycosylated hemoglobin (Hb A1C) - Microalbumin / creatinine urine ratio  [name] MD

## 2015-10-04 LAB — MICROALBUMIN / CREATININE URINE RATIO
CREATININE, UR: 74.4 mg/dL
MICROALB/CREAT RATIO: 5.5 mg/g creat (ref 0.0–30.0)
MICROALBUM., U, RANDOM: 4.1 ug/mL

## 2015-10-22 ENCOUNTER — Other Ambulatory Visit: Payer: Self-pay | Admitting: Pharmacist

## 2015-11-14 ENCOUNTER — Telehealth: Payer: Self-pay | Admitting: Pharmacist

## 2015-11-14 MED ORDER — INSULIN ASPART 100 UNIT/ML ~~LOC~~ SOLN: SUBCUTANEOUS | Status: DC

## 2015-11-14 MED ORDER — EMPAGLIFLOZIN 25 MG PO TABS: 25.0000 mg | ORAL_TABLET | Freq: Every day | ORAL | Status: DC

## 2015-11-14 NOTE — Telephone Encounter (Signed)
Called [facility] [place] to find out if current insulin Novolog is preferred or non preferred.  However per [facility] was [facility] [place].  Called [facility] and they needed clarification on dosing.  Per last insulin pump report her average daily insulin dose was 98 +/- 10 units (so estimated max daily dose should be about 110 units per day.  They reprocessed with #4 vials as a 33 day supply and cost was lowered to $154.53

## 2015-11-14 NOTE — Telephone Encounter (Signed)
Patient was notified.  Her new plan  - she is responsible for 15% of drug cost and she is concerned about Novolog and Invokana / Jardiance cost.  I gave patient information about how to get savings cards which should bring cost of Novolog down to about $50 and Jardiance zero copay.

## 2015-11-28 ENCOUNTER — Other Ambulatory Visit: Payer: Self-pay | Admitting: Pharmacist

## 2015-11-28 NOTE — Telephone Encounter (Signed)
Last seen 10/03/15  Dr Sabra Heck  Last lipid 03/29/15  Requesting 90 days

## 2015-11-29 NOTE — Telephone Encounter (Signed)
Pt is aware rx sent to pharmacy.  

## 2015-12-18 ENCOUNTER — Telehealth: Payer: Self-pay | Admitting: Pharmacist

## 2015-12-19 NOTE — Telephone Encounter (Signed)
Tried to call to discuss patient's concerns - LMOVM

## 2015-12-20 MED ORDER — CANAGLIFLOZIN 300 MG PO TABS: 300.0000 mg | ORAL_TABLET | Freq: Every day | ORAL | Status: DC

## 2015-12-20 NOTE — Telephone Encounter (Signed)
Since starting Jardiance - weight has increased and BG is elevated in am - ranges from 250 to 275.  She would like to change back to [facility] but it is not on her current formulary.  I have given her #30 Invokana 300mg  samples to retry and see if it works better.  If it does then we can try to get PA from insurance.

## 2015-12-26 ENCOUNTER — Other Ambulatory Visit: Payer: Self-pay | Admitting: Family Medicine

## 2016-01-03 ENCOUNTER — Ambulatory Visit: Payer: Self-pay | Admitting: Pharmacist

## 2016-01-11 ENCOUNTER — Telehealth: Payer: Self-pay | Admitting: Pharmacist

## 2016-01-11 MED ORDER — METFORMIN HCL 1000 MG PO TABS: ORAL_TABLET | ORAL | Status: DC

## 2016-01-11 MED ORDER — INSULIN ASPART 100 UNIT/ML ~~LOC~~ SOLN: SUBCUTANEOUS | Status: DC

## 2016-01-11 MED ORDER — GLUCOSE BLOOD VI STRP: ORAL_STRIP | Status: DC

## 2016-01-11 MED ORDER — CANAGLIFLOZIN 300 MG PO TABS: 300.0000 mg | ORAL_TABLET | Freq: Every day | ORAL | Status: DC

## 2016-01-11 NOTE — Telephone Encounter (Signed)
Patient requested Rx for metformin, insulin and test strips.  Her ins prefers Jardiance but her BG was not well controlled with Jardiance.  She has switched back to [facility] 300mg  and has tolerated well.  She has appt in April.   #5 tablet of samples of Inovkana 300mg  given and Rx with free 30 day coupon given to patient.  Should last until appt and then we will start prior auth.

## 2016-01-18 ENCOUNTER — Other Ambulatory Visit: Payer: Self-pay | Admitting: Family Medicine

## 2016-02-04 ENCOUNTER — Ambulatory Visit: Payer: BC Managed Care – PPO | Admitting: Pharmacist

## 2016-02-06 ENCOUNTER — Ambulatory Visit (INDEPENDENT_AMBULATORY_CARE_PROVIDER_SITE_OTHER): Payer: BC Managed Care – PPO | Admitting: Pharmacist

## 2016-02-06 ENCOUNTER — Encounter: Payer: Self-pay | Admitting: Pharmacist

## 2016-02-06 VITALS — BP 132/80 | HR 78 | Ht 62.0 in | Wt 236.0 lb

## 2016-02-06 DIAGNOSIS — E119 Type 2 diabetes mellitus without complications: Secondary | ICD-10-CM

## 2016-02-06 DIAGNOSIS — Z794 Long term (current) use of insulin: Secondary | ICD-10-CM

## 2016-02-06 DIAGNOSIS — E669 Obesity, unspecified: Secondary | ICD-10-CM | POA: Diagnosis not present

## 2016-02-06 LAB — BAYER DCA HB A1C WAIVED: HB A1C: 8.4 % — AB (ref <7.0)

## 2016-02-06 MED ORDER — DULAGLUTIDE 0.75 MG/0.5ML ~~LOC~~ SOAJ: 0.7500 mg | SUBCUTANEOUS | Status: DC

## 2016-02-06 NOTE — Patient Instructions (Signed)
Diabetes and Standards of Medical Care   Diabetes is complicated. You may find that your diabetes team includes a dietitian, nurse, diabetes educator, eye doctor, and more. To help everyone know what is going on and to help you get the care you deserve, the following schedule of care was developed to help keep you on track. Below are the tests, exams, vaccines, medicines, education, and plans you will need.  Blood Glucose Goals Prior to meals = 80 - 130 Within 2 hours of the start of a meal = less than 180  HbA1c test (goal is less than 7.0% - your last value was 8.4%) This test shows how well you have controlled your glucose over the past 2 to 3 months. It is used to see if your diabetes management plan needs to be adjusted.   It is performed at least 2 times a year if you are meeting treatment goals.  It is performed 4 times a year if therapy has changed or if you are not meeting treatment goals.  Blood pressure test  This test is performed at every routine medical visit. The goal is less than 140/90 mmHg for most people, but 130/80 mmHg in some cases. Ask your health care provider about your goal.  Dental exam  Follow up with the dentist regularly.  Eye exam  If you are diagnosed with type 1 diabetes as a child, get an exam upon reaching the age of 90 years or older and have had diabetes for 3 to 5 years. Yearly eye exams are recommended after that initial eye exam.  If you are diagnosed with type 1 diabetes as an adult, get an exam within 5 years of diagnosis and then yearly.  If you are diagnosed with type 2 diabetes, get an exam as soon as possible after the diagnosis and then yearly.  Foot care exam  Visual foot exams are performed at every routine medical visit. The exams check for cuts, injuries, or other problems with the feet.  A comprehensive foot exam should be done yearly. This includes visual inspection as well as assessing foot pulses and testing for loss of  sensation.  Check your feet nightly for cuts, injuries, or other problems with your feet. Tell your health care provider if anything is not healing.  Kidney function test (urine microalbumin)  This test is performed once a year.  Type 1 diabetes: The first test is performed 5 years after diagnosis.  Type 2 diabetes: The first test is performed at the time of diagnosis.  A serum creatinine and estimated glomerular filtration rate (eGFR) test is done once a year to assess the level of chronic kidney disease (CKD), if present.  Lipid profile (cholesterol, HDL, LDL, triglycerides)  Performed every 5 years for most people.  The goal for LDL is less than 100 mg/dL. If you are at high risk, the goal is less than 70 mg/dL.  The goal for HDL is 40 mg/dL to 50 mg/dL for men and 50 mg/dL to 60 mg/dL for women. An HDL cholesterol of 60 mg/dL or higher gives some protection against heart disease.  The goal for triglycerides is less than 150 mg/dL.  Influenza vaccine, pneumococcal vaccine, and hepatitis B vaccine  The influenza vaccine is recommended yearly.  The pneumococcal vaccine is generally given once in a lifetime. However, there are some instances when another vaccination is recommended. Check with your health care provider.  The hepatitis B vaccine is also recommended for adults with diabetes.  Diabetes self-management education  Education is recommended at diagnosis and ongoing as needed.  Treatment plan  Your treatment plan is reviewed at every medical visit.  Document Released: 08/17/2009 Document Revised: 06/22/2013 Document Reviewed: 03/22/2013 Exit[employer] Patient Information 2014 [employer].   

## 2016-02-06 NOTE — Progress Notes (Signed)
Diabetes Follow-Up Visit  Chief Complaint:   Chief Complaint  Patient presents with  . Diabetes    Filed Vitals:   02/06/16 1141  BP: 132/80  Pulse: 78   Filed Weights   02/06/16 1141  Weight: 236 lb (107.049 kg)   Body mass index is 43.15 kg/(m^2).  HPI:  Patient last seen 06/2015 for uncontrolled DM and insulin pump adjustment.  She is currently using medtronic insulin pump Paradigm 722.  She has back surgery October 2016 and has been less active over the last few months.  She has recently been given OK by surgeon to do light exercise.   Uses Novolog insulin in pump.  Also takes metformin 1097m bid with food and Invokana 3060m1 tablet daily.  (she was switch to jardiance by her insurance but felt that her BG was not as well controlled so has been taking samples to see if BG improves)  Current Insulin Pump Settings:        Basal:  MN to 2am = 2.10                    2am to 8am = 2.25                    8am to 5pm = 2.45                    5pm to MN = 2.60       Insulin to CHO ratio =  5       Insulin Sensitivity = 13  Home BG Monitoring:  Checking 4 to  times a day. 14 day avg = 192 +/- 37 No hypoglycemia  Average total daily insulin = 111 +/- 15 units  Low fat/carbohydrate diet?  Yes most days Nicotine Abuse?  No Medication Compliance?  Yes Exercise?  No  Alcohol use?  No  BMI:  Body mass index is 43.15 kg/(m^2).    Weight changes:  stable General Appearance:  obese Mood/Affect:  normal   Lab Results  Component Value Date   HGBA1C 8.4 02/06/2016    Previous  A1c = 6.8% (10/03/2015)      A1c = 7.5% (03/29/2015)      A1c = 8.1% (12/08/2014)  No results found for: [facility]  Component Value Date   CHOL 129 03/29/2015   HDL 46 03/29/2015   LDLCALC 52 03/29/2015   LDLDIRECT 57 03/29/2015   TRIG 157* 03/29/2015   CHOLHDL 2.8 03/29/2015      Assessment: 1.  Diabetes - uncontrolled 2.  Blood Pressure.  At goal today 3.  Lipids.  LDL at  goal and Tg elevated at last check  4.  Obesity - Weight has increased by 8#a since our last visit   Recommendations: 1.  Medication recommendations at this time are as follows:   D/C invokana   Start Trulicity 0.4.40NUQ qweek - #4 samples given (=98m41m Insulin Pump Settings -          Basal:  MN to 2am = 2.10                    2am to 8am = 2.25                    8am to 5pm = 2.45                    5pm to MN =  2.60       Insulin to CHO ratio = 5       Insulin Sensitivity = 13  2.  Reviewed HBG goals:  Fasting 80-130 and 1-2 hour post prandial <180.  Patient is instructed to check BG 4 to 5 times per day.    3.  BP goal < 140/85. 4.  LDL goal of < 100, HDL > 40 and TG < 150. 5.  Dietary recommendations:  Reviewed CHO counting 6.  Mammogram - patient has scheduled at [facility] for end of May 2017. 7.  Patient reminded to get eye exam 8.  Return to clinic in 4 wks   Orders Placed This Encounter  Procedures  . Bayer DCA Hb A1c Waived  . BMP8+EGFR    Time spent counseling patient:  60 minutes   Cherre Robins, PharmD, CPP. CDE

## 2016-02-07 ENCOUNTER — Encounter: Payer: Self-pay | Admitting: Pharmacist

## 2016-02-07 LAB — BMP8+EGFR
BUN/Creatinine Ratio: 23 (ref 12–28)
BUN: 21 mg/dL (ref 8–27)
CALCIUM: 9.5 mg/dL (ref 8.7–10.3)
CHLORIDE: 99 mmol/L (ref 96–106)
CO2: 20 mmol/L (ref 18–29)
CREATININE: 0.91 mg/dL (ref 0.57–1.00)
GFR calc Af Amer: 78 mL/min/{1.73_m2} (ref >59)
GFR calc non Af Amer: 68 mL/min/{1.73_m2} (ref >59)
GLUCOSE: 187 mg/dL — AB (ref 65–99)
Potassium: 4.8 mmol/L (ref 3.5–5.2)
Sodium: 139 mmol/L (ref 134–144)

## 2016-03-10 ENCOUNTER — Ambulatory Visit (INDEPENDENT_AMBULATORY_CARE_PROVIDER_SITE_OTHER): Payer: BC Managed Care – PPO | Admitting: Pharmacist

## 2016-03-10 ENCOUNTER — Encounter: Payer: Self-pay | Admitting: Pharmacist

## 2016-03-10 VITALS — BP 122/80 | HR 78 | Ht 62.0 in | Wt 242.0 lb

## 2016-03-10 DIAGNOSIS — Z794 Long term (current) use of insulin: Secondary | ICD-10-CM | POA: Diagnosis not present

## 2016-03-10 DIAGNOSIS — E669 Obesity, unspecified: Secondary | ICD-10-CM | POA: Diagnosis not present

## 2016-03-10 DIAGNOSIS — E119 Type 2 diabetes mellitus without complications: Secondary | ICD-10-CM

## 2016-03-10 MED ORDER — ATORVASTATIN CALCIUM 40 MG PO TABS: ORAL_TABLET | ORAL | Status: DC

## 2016-03-10 MED ORDER — BENAZEPRIL HCL 10 MG PO TABS: 10.0000 mg | ORAL_TABLET | Freq: Every day | ORAL | Status: DC

## 2016-03-10 MED ORDER — DULAGLUTIDE 1.5 MG/0.5ML ~~LOC~~ SOAJ: 1.5000 mg | SUBCUTANEOUS | Status: DC

## 2016-03-10 NOTE — Progress Notes (Signed)
Diabetes Follow-Up Visit  Chief Complaint:   Chief Complaint  Patient presents with  . Diabetes    Filed Vitals:   03/10/16 1109  BP: 122/80  Pulse: 78   Filed Weights   03/10/16 1109  Weight: 242 lb (109.77 kg)   Body mass index is 44.25 kg/(m^2).  HPI:  Patient last seen 01/2016 for uncontrolled DM and insulin pump adjustment.  She is currently using medtronic insulin pump Paradigm 722.  Uses Novolog insulin in pump.  Also takes metformin 1000mg  bid with food. Started Trulicity 0.75mg  SQ weekly 1 month ago.   Current Insulin Pump Settings:        Basal:  MN to 2am = 2.10                    2am to 8am = 2.25                    8am to 5pm = 2.45                    5pm to MN = 2.60       Insulin to CHO ratio =  5       Insulin Sensitivity = 13  Home BG Monitoring:  Checking 4 to  times a day. 14 day avg = 194 +/- 35 No hypoglycemia  Average total daily insulin = 106.7 +/- 11 units  Low fat/carbohydrate diet?  Yes most days Nicotine Abuse?  No Medication Compliance?  Yes Exercise?  Yes - walking 1-2 times per week - very leisurely mall walking. Alcohol use?  No  BMI:  Body mass index is 44.25 kg/(m^2).    Weight changes:  stable General Appearance:  obese Mood/Affect:  normal   Lab Results  Component Value Date   HGBA1C 8.4 02/06/2016    Previous  A1c = 6.8% (10/03/2015)      A1c = 7.5% (03/29/2015)      A1c = 8.1% (12/08/2014)  No results found for: [facility]  Lab Results  Component Value Date   CHOL 129 03/29/2015   HDL 46 03/29/2015   LDLCALC 52 03/29/2015   LDLDIRECT 57 03/29/2015   TRIG 157* 03/29/2015   CHOLHDL 2.8 03/29/2015      Assessment: 1.  Diabetes - uncontrolled 2.  Blood Pressure.  At goal today 3.  Lipids.  LDL at goal and Tg elevated at last check  4.  Obesity - Weight has increased by 6# since our last visit   Recommendations: 1.  Medication recommendations at this time are as follows:   Increase Trulicity to 1.5mg  SQ qweek -  #4 samples given (=22mL)  Insulin Pump Settings - no change         Basal:  MN to 2am = 2.10                    2am to 8am = 2.25                    8am to 5pm = 2.45                    5pm to MN = 2.60       Insulin to CHO ratio = 5       Insulin Sensitivity = 13  2.  Reviewed HBG goals:  Fasting 80-130 and 1-2 hour post prandial <180.  Patient is instructed to check BG 4 to 5  times per day.    3.  BP goal < 140/85. 4.  LDL goal of < 100, HDL > 40 and TG < 150. 5.  Dietary recommendations:  Reviewed CHO counting 6.  Mammogram - patient has scheduled at [facility] for end of May 2017. 7.  Patient reminded to get eye exam 8.  Return to clinic in 4 wks to see PCP  No orders of the defined types were placed in this encounter.    Time spent counseling patient:  30 minutes   Cherre Robins, PharmD, CPP. CDE

## 2016-04-29 ENCOUNTER — Ambulatory Visit (INDEPENDENT_AMBULATORY_CARE_PROVIDER_SITE_OTHER): Payer: BC Managed Care – PPO | Admitting: Family Medicine

## 2016-04-29 ENCOUNTER — Emergency Department (HOSPITAL_COMMUNITY): Payer: BC Managed Care – PPO

## 2016-04-29 ENCOUNTER — Encounter (HOSPITAL_COMMUNITY): Payer: Self-pay | Admitting: Emergency Medicine

## 2016-04-29 ENCOUNTER — Ambulatory Visit (INDEPENDENT_AMBULATORY_CARE_PROVIDER_SITE_OTHER): Payer: BC Managed Care – PPO

## 2016-04-29 ENCOUNTER — Emergency Department (HOSPITAL_COMMUNITY)
Admission: EM | Admit: 2016-04-29 | Discharge: 2016-04-29 | Disposition: A | Discharge status: Home / Self Care | Payer: BC Managed Care – PPO | Attending: Emergency Medicine | Admitting: Emergency Medicine

## 2016-04-29 ENCOUNTER — Encounter: Payer: Self-pay | Admitting: Family Medicine

## 2016-04-29 VITALS — BP 120/66 | HR 90 | Temp 97.1°F | Ht 62.0 in | Wt 251.0 lb

## 2016-04-29 DIAGNOSIS — Z7982 Long term (current) use of aspirin: Secondary | ICD-10-CM | POA: Diagnosis not present

## 2016-04-29 DIAGNOSIS — I10 Essential (primary) hypertension: Secondary | ICD-10-CM | POA: Diagnosis not present

## 2016-04-29 DIAGNOSIS — R109 Unspecified abdominal pain: Secondary | ICD-10-CM

## 2016-04-29 DIAGNOSIS — Z79899 Other long term (current) drug therapy: Secondary | ICD-10-CM | POA: Insufficient documentation

## 2016-04-29 DIAGNOSIS — E119 Type 2 diabetes mellitus without complications: Secondary | ICD-10-CM | POA: Diagnosis not present

## 2016-04-29 DIAGNOSIS — R1084 Generalized abdominal pain: Secondary | ICD-10-CM | POA: Insufficient documentation

## 2016-04-29 DIAGNOSIS — E785 Hyperlipidemia, unspecified: Secondary | ICD-10-CM | POA: Insufficient documentation

## 2016-04-29 DIAGNOSIS — R112 Nausea with vomiting, unspecified: Secondary | ICD-10-CM | POA: Insufficient documentation

## 2016-04-29 LAB — CBC WITH DIFFERENTIAL/PLATELET
BASOS PCT: 0 %
Basophils Absolute: 0 10*3/uL (ref 0.0–0.1)
EOS ABS: 0.3 10*3/uL (ref 0.0–0.7)
Eosinophils Relative: 3 %
HCT: 38.1 % (ref 36.0–46.0)
Hemoglobin: 12.3 g/dL (ref 12.0–15.0)
Lymphocytes Relative: 8 %
Lymphs Abs: 0.7 10*3/uL (ref 0.7–4.0)
MCH: 28.8 pg (ref 26.0–34.0)
MCHC: 32.3 g/dL (ref 30.0–36.0)
MCV: 89.2 fL (ref 78.0–100.0)
MONO ABS: 0.7 10*3/uL (ref 0.1–1.0)
MONOS PCT: 7 %
Neutro Abs: 7.9 10*3/uL — ABNORMAL HIGH (ref 1.7–7.7)
Neutrophils Relative %: 82 %
Platelets: 343 10*3/uL (ref 150–400)
RBC: 4.27 MIL/uL (ref 3.87–5.11)
RDW: 13.5 % (ref 11.5–15.5)
WBC: 9.7 10*3/uL (ref 4.0–10.5)

## 2016-04-29 LAB — COMPREHENSIVE METABOLIC PANEL
ALBUMIN: 3.6 g/dL (ref 3.5–5.0)
ALK PHOS: 66 U/L (ref 38–126)
ALT: 17 U/L (ref 14–54)
AST: 21 U/L (ref 15–41)
Anion gap: 9 (ref 5–15)
BUN: 26 mg/dL — ABNORMAL HIGH (ref 6–20)
CALCIUM: 9.8 mg/dL (ref 8.9–10.3)
CO2: 21 mmol/L — AB (ref 22–32)
CREATININE: 0.93 mg/dL (ref 0.44–1.00)
Chloride: 107 mmol/L (ref 101–111)
GFR calc Af Amer: >60 mL/min (ref >60)
GFR calc non Af Amer: >60 mL/min (ref >60)
GLUCOSE: 110 mg/dL — AB (ref 65–99)
Potassium: 4.5 mmol/L (ref 3.5–5.1)
SODIUM: 137 mmol/L (ref 135–145)
Total Bilirubin: 0.7 mg/dL (ref 0.3–1.2)
Total Protein: 6.8 g/dL (ref 6.5–8.1)

## 2016-04-29 LAB — LIPASE, BLOOD: Lipase: 27 U/L (ref 11–51)

## 2016-04-29 MED ORDER — ONDANSETRON 4 MG PO TBDP: ORAL_TABLET | ORAL | Status: DC

## 2016-04-29 MED ORDER — HYDROMORPHONE HCL 1 MG/ML IJ SOLN
0.5000 mg | Freq: Once | INTRAMUSCULAR | Status: AC
  Administered 2016-04-29: 0.5 mg via INTRAVENOUS
  Filled 2016-04-29: qty 1

## 2016-04-29 MED ORDER — SODIUM CHLORIDE 0.9 % IV BOLUS (SEPSIS)
1000.0000 mL | Freq: Once | INTRAVENOUS | Status: AC
  Administered 2016-04-29: 1000 mL via INTRAVENOUS

## 2016-04-29 MED ORDER — ONDANSETRON HCL 4 MG/2ML IJ SOLN
4.0000 mg | Freq: Once | INTRAMUSCULAR | Status: AC
  Administered 2016-04-29: 4 mg via INTRAVENOUS
  Filled 2016-04-29: qty 2

## 2016-04-29 MED ORDER — IOPAMIDOL (ISOVUE-300) INJECTION 61%
100.0000 mL | Freq: Once | INTRAVENOUS | Status: AC | PRN
  Administered 2016-04-29: 100 mL via INTRAVENOUS

## 2016-04-29 NOTE — ED Notes (Signed)
Patient complaining of abdominal pain off and on x 3-4 weeks, worsening since Friday. States "I was at my doctors office and they were trying to get me an ultrasound scheduled here so I went home and ate something and started vomiting. So I called my doctor and he told me to go ahead to the ER for testing."

## 2016-04-29 NOTE — ED Provider Notes (Signed)
CSN: [id number]     Arrival date & time 04/29/16  1326 History   First MD Initiated Contact with Patient 04/29/16 1353     Chief Complaint  Patient presents with  . Abdominal Pain  . Emesis     (Consider location/radiation/quality/duration/timing/severity/associated sxs/prior Treatment) Patient is a 66 y.o. female presenting with abdominal pain and vomiting. The history is provided by the patient (Patient complains of abdominal pain for a few days and vomiting. She also states that her abdomen seems to be distended).  Abdominal Pain Pain location:  Generalized Pain quality: aching   Pain radiates to:  Does not radiate Pain severity:  Moderate Onset quality:  Sudden Timing:  Constant Progression:  Waxing and waning Chronicity:  New Context: not alcohol use   Associated symptoms: vomiting   Associated symptoms: no chest pain, no cough, no diarrhea, no fatigue and no hematuria   Emesis Associated symptoms: abdominal pain   Associated symptoms: no diarrhea and no headaches     Past Medical History  Diagnosis Date  . Hypertension   . Diabetes mellitus without complication ([place])   . Hyperlipidemia   . Low serum vitamin D    Past Surgical History  Procedure Laterality Date  . Cesarean section    . Lumbar fusion  08/21/15    L3-L4 Dr. Timmothy Euler   Family History  Problem Relation Age of Onset  . Cancer Mother   . Cancer Father   . Diabetes Paternal Grandmother   . Diabetes Paternal Grandfather    Social History  Substance Use Topics  . Smoking status: Never Smoker   . Smokeless tobacco: Never Used  . Alcohol Use: No   OB History    No data available     Review of Systems  Constitutional: Negative for appetite change and fatigue.  HENT: Negative for congestion, ear discharge and sinus pressure.   Eyes: Negative for discharge.  Respiratory: Negative for cough.   Cardiovascular: Negative for chest pain.  Gastrointestinal: Positive for vomiting and abdominal  pain. Negative for diarrhea.  Genitourinary: Negative for frequency and hematuria.  Musculoskeletal: Negative for back pain.  Skin: Negative for rash.  Neurological: Negative for seizures and headaches.  Psychiatric/Behavioral: Negative for hallucinations.      Allergies  Avandia; Micronase; and Actos  Home Medications   Prior to Admission medications   Medication Sig Start Date End Date Taking? Authorizing Provider  aspirin 81 MG tablet Take 1 tablet (81 mg total) by mouth daily. Patient taking differently: Take 81 mg by mouth every evening.  04/21/14  Yes Tammy Eckard, PHARMD  atorvastatin (LIPITOR) 40 MG tablet TAKE 1 TABLET(40 MG) BY MOUTH DAILY Patient taking differently: Take 40 mg by mouth every evening.  03/10/16  Yes Tammy Eckard, PHARMD  benazepril (LOTENSIN) 10 MG tablet Take 1 tablet (10 mg total) by mouth daily. Patient taking differently: Take 10 mg by mouth every evening.  03/10/16  Yes Tammy Eckard, PHARMD  CALCIUM PO Take 1 tablet by mouth 2 (two) times daily.   Yes Historical Provider, MD  furosemide (LASIX) 20 MG tablet Take 1 tablet (20 mg total) by mouth daily. 10/03/15  Yes [name], MD  glucosamine-chondroitin 500-400 MG tablet Take 1 tablet by mouth 2 (two) times daily.   Yes Historical Provider, MD  HYDROcodone-acetaminophen (NORCO) 5-325 MG tablet Take 1-2 tablets by mouth every 6 (six) hours as needed for moderate pain. 08/06/15  Yes Chipper Herb, MD  insulin aspart (NOVOLOG) 100 UNIT/ML injection  USE IN INSULIN PUMP AS DIRECTED (Max daily dose is 110 units per day) Patient taking differently: by Pump Prime route continuous. USE IN INSULIN PUMP AS DIRECTED (Max daily dose is 110 units per day) 01/11/16  Yes [name], MD  metFORMIN (GLUCOPHAGE) 1000 MG tablet TAKE 1 TABLET BY MOUTH TWICE DAILY WITH A MEAL Patient taking differently: Take 1,000 mg by mouth 2 (two) times daily with a meal.  01/11/16  Yes [name], MD  Multiple Vitamin  (MULTIVITAMIN WITH MINERALS) TABS Take 1 tablet by mouth daily.   Yes Historical Provider, MD  glucose blood (ONE TOUCH ULTRA TEST) test strip USE TO CHECK BLOOD SUGAR UP TO 5 TIMES A DAY 01/11/16   [name], MD   BP 139/60 mmHg  Pulse 93  Temp(Src) 99 F (37.2 C) (Oral)  Resp 20  Ht 5\' 4"  (1.626 m)  Wt 250 lb (113.399 kg)  BMI 42.89 kg/m2  SpO2 96% Physical Exam  Constitutional: She is oriented to person, place, and time. She appears well-developed.  HENT:  Head: Normocephalic.  Eyes: Conjunctivae and EOM are normal. No scleral icterus.  Neck: Neck supple. No thyromegaly present.  Cardiovascular: Normal rate and regular rhythm.  Exam reveals no gallop and no friction rub.   No murmur heard. Pulmonary/Chest: No stridor. She has no wheezes. She has no rales. She exhibits no tenderness.  Abdominal: She exhibits no distension. There is tenderness. There is no rebound.  Abdomen distended and tender all throughout.  Musculoskeletal: Normal range of motion. She exhibits no edema.  Lymphadenopathy:    She has no cervical adenopathy.  Neurological: She is oriented to person, place, and time. She exhibits normal muscle tone. Coordination normal.  Skin: No rash noted. No erythema.  Psychiatric: She has a normal mood and affect. Her behavior is normal.    ED Course  Procedures (including critical care time) Labs Review Labs Reviewed  COMPREHENSIVE METABOLIC PANEL - Abnormal; Notable for the following:    CO2 21 (*)    Glucose, Bld 110 (*)    BUN 26 (*)    All other components within normal limits  CBC WITH DIFFERENTIAL/PLATELET - Abnormal; Notable for the following:    Neutro Abs 7.9 (*)    All other components within normal limits  LIPASE, BLOOD  URINALYSIS, ROUTINE W REFLEX MICROSCOPIC (NOT AT [facility])    Imaging Review Dg Abd 1 View  04/29/2016  CLINICAL DATA:  Abdominal pain and distension, history of hyperlipidemia and diabetes EXAM: ABDOMEN - 1 VIEW COMPARISON:  None  in PACs FINDINGS: The study is limited due to the patient's body habitus and resultant scatter effects. The visualized portions of the bowel exhibited normal stool and gas pattern. No free extraluminal gas collections are observed. The patient has undergone previous lateral and interbody fusion at L4-5. The fusion hardware appears intact. IMPRESSION: Very limited study. No definite acute intra-abdominal abnormality observed. Electronically Signed   By: David  Martinique M.D.   On: 04/29/2016 12:42   Ct Abdomen Pelvis W Contrast  04/29/2016  CLINICAL DATA:  Patient with intermittent abdominal pain for 3-4 weeks. EXAM: CT ABDOMEN AND PELVIS WITH CONTRAST TECHNIQUE: Multidetector CT imaging of the abdomen and pelvis was performed using the standard protocol following bolus administration of intravenous contrast. CONTRAST:  129mL ISOVUE-300 IOPAMIDOL (ISOVUE-300) INJECTION 61% COMPARISON:  None. FINDINGS: Lower chest: Heart is normal in size. No pericardial effusion. Dependent atelectasis within the bilateral lower lobes. No pleural effusion. Hepatobiliary: Liver is  normal in size and contour. No focal hepatic lesions identified. Gallbladder is unremarkable. Pancreas: Unremarkable Spleen: Unremarkable Adrenals/Urinary Tract: Normal adrenal glands. Kidneys enhance symmetrically with contrast. There is a 3 mm nonobstructing stone within the superior pole of the left kidney and a 4 mm nonobstructing stone within the inferior pole of the left kidney. Additional 4 mm stone interpolar region left kidney. Too small to characterize low-attenuation lesion inferior pole right kidney. Urinary bladder is unremarkable. Stomach/Bowel: No abnormal bowel wall thickening or evidence for bowel obstruction. Vascular/Lymphatic: Normal caliber abdominal aorta. Peripheral calcified atherosclerotic plaque. No retroperitoneal lymphadenopathy. Other: Masslike expansion of the uterine fundus. Adnexal structures unremarkable. There is perihepatic  and perisplenic ascites. Free fluid within the paracolic gutters and within the pelvis. Omental caking is demonstrated within the anterior abdomen. Musculoskeletal: Lumbar spine degenerative changes. No aggressive or acute appearing osseous lesions. Edema involving the anterior abdominal wall. IMPRESSION: Extensive omental caking as well as moderate amount of ascites within the abdomen most compatible with peritoneal metastatic disease, of unknown primary. This may potentially be ovarian or a GI in etiology. There is masslike expansion of what appears to be the uterine fundus, likely secondary to a fibroid however given the extensive pathology in the abdomen, recommend correlation with pelvic ultrasound for more definitive characterization. Nonobstructing left-sided nephrolithiasis. Aortic atherosclerosis These results were called by telephone at the time of interpretation on 04/29/2016 at 4:15 pm to Dr. Sabra Heck, who verbally acknowledged these results. Electronically Signed   By: Lovey Newcomer M.D.   On: 04/29/2016 16:21   I have personally reviewed and evaluated these images and lab results as part of my medical decision-making.   EKG Interpretation None      MDM   Final diagnoses:  None    CT scan shows possible peritoneal metastatic disease. I discussed this with the oncologist in the patient will be seen tomorrow at 2:30 for further workup    Milton Ferguson, MD 04/29/16 1705

## 2016-04-29 NOTE — Discharge Instructions (Signed)
Follow up with dr. Whitney Muse at 2:30 pm on the 4th floor at [facility]

## 2016-04-29 NOTE — ED Notes (Signed)
Appt given by [place] for 2:30 pm on 6/28 with Dr. Whitney Muse and Marcello Moores.

## 2016-04-29 NOTE — Progress Notes (Signed)
Subjective:    Patient ID: Ellen Hunt, female    DOB: 11/16/1950, 65 y.o.   MRN: [id number]  HPI Patient here today for abdominal pains and abnormal bowel habits. She thinks these symptoms are related to recent medications.  Patient complains of severe stomach pains worse after eating has alternating constipation and diarrhea. Bloating indigestion and weight gain are all symptoms. She is intolerant of fried or fatty foods in general. The pain does not really localize very well.   Patient Active Problem List   Diagnosis Date Noted  . Sciatica neuralgia 02/12/2015  . Severe obesity (BMI >= 40) ([place]) 10/12/2014  . BP (high blood pressure) 09/04/2014  . Adiposity 09/04/2014  . Type 2 diabetes mellitus treated with insulin ([place]) 01/25/2014  . Vitamin D insufficiency 01/25/2014  . Hyperlipidemia 01/25/2014  . Osteopenia 01/25/2014   Outpatient Encounter Prescriptions as of 04/29/2016  Medication Sig  . aspirin 81 MG tablet Take 1 tablet (81 mg total) by mouth daily.  Marland Kitchen atorvastatin (LIPITOR) 40 MG tablet TAKE 1 TABLET(40 MG) BY MOUTH DAILY  . benazepril (LOTENSIN) 10 MG tablet Take 1 tablet (10 mg total) by mouth daily.  . Calcium Carb-Cholecalciferol (CALCIUM 600 + D PO) Take 1 tablet by mouth 2 (two) times daily.  . Coenzyme Q10 10 MG capsule Take 10 mg by mouth 2 (two) times daily.  . furosemide (LASIX) 20 MG tablet Take 1 tablet (20 mg total) by mouth daily.  Marland Kitchen glucosamine-chondroitin 500-400 MG tablet Take 1 tablet by mouth 2 (two) times daily.  Marland Kitchen glucose blood (ONE TOUCH ULTRA TEST) test strip USE TO CHECK BLOOD SUGAR UP TO 5 TIMES A DAY  . HYDROcodone-acetaminophen (NORCO) 5-325 MG tablet Take 1-2 tablets by mouth every 6 (six) hours as needed for moderate pain.  Marland Kitchen insulin aspart (NOVOLOG) 100 UNIT/ML injection USE IN INSULIN PUMP AS DIRECTED (Max daily dose is 110 units per day)  . metFORMIN (GLUCOPHAGE) 1000 MG tablet TAKE 1 TABLET BY MOUTH TWICE DAILY WITH A MEAL  . Multiple  Vitamin (MULTIVITAMIN WITH MINERALS) TABS Take 1 tablet by mouth daily.  . [DISCONTINUED] Dulaglutide (TRULICITY) 1.5 0000000 SOPN Inject 1.5 mg into the skin once a week.  . [DISCONTINUED] fluconazole (DIFLUCAN) 150 MG tablet Repeat again in 1 week PRN.   No facility-administered encounter medications on file as of 04/29/2016.      Review of Systems  Constitutional: Positive for unexpected weight change (increase).  HENT: Negative.   Eyes: Negative.   Respiratory: Positive for shortness of breath (worse when walking).   Cardiovascular: Negative.   Gastrointestinal: Positive for abdominal pain, diarrhea and constipation.       Indigestion  Endocrine: Negative.   Genitourinary: Negative.   Musculoskeletal: Negative.   Skin: Negative.   Allergic/Immunologic: Negative.   Neurological: Negative.   Hematological: Negative.   Psychiatric/Behavioral: Negative.        Objective:   Physical Exam  Constitutional: She appears well-developed and well-nourished.  Abdominal: Soft. She exhibits distension. There is tenderness. There is no rebound and no guarding.  Abdomen appears distended with hypoactive bowel sounds. X-ray shows no dilated loops of bowel but a normal bowel gas pattern. There is somewhat of a groundglass appearance but I think that is a result of her increased adipose tissue    BP 120/66 mmHg  Pulse 90  Temp(Src) 97.1 F (36.2 C) (Oral)  Ht 5\' 2"  (1.575 m)  Wt 251 lb (113.853 kg)  BMI 45.90 kg/m2  SpO2 93%  Assessment & Plan:  1. Generalized abdominal pain Symptoms would certainly suggest gallbladder disease. Other considerations would be GERD. Based on report of abdominal ultrasound may need GI opinion or possible trial of a PPI or H2 blocker  [name] MD - DG Abd 1 View; Future - US Abdomen Limited; Future

## 2016-04-29 NOTE — ED Provider Notes (Signed)
Carcinomatosis, ascites - unsure of source. Per radiology Will d/w Dr. Thea Alken, MD 04/29/16 [phone]

## 2016-04-30 ENCOUNTER — Encounter (HOSPITAL_COMMUNITY): Payer: Self-pay | Admitting: Oncology

## 2016-04-30 ENCOUNTER — Encounter (HOSPITAL_COMMUNITY): Payer: BC Managed Care – PPO | Attending: Oncology | Admitting: Oncology

## 2016-04-30 VITALS — Resp 20 | Ht 64.0 in

## 2016-04-30 DIAGNOSIS — R112 Nausea with vomiting, unspecified: Secondary | ICD-10-CM

## 2016-04-30 DIAGNOSIS — R188 Other ascites: Secondary | ICD-10-CM | POA: Diagnosis not present

## 2016-04-30 DIAGNOSIS — R935 Abnormal findings on diagnostic imaging of other abdominal regions, including retroperitoneum: Secondary | ICD-10-CM

## 2016-04-30 DIAGNOSIS — E785 Hyperlipidemia, unspecified: Secondary | ICD-10-CM | POA: Diagnosis not present

## 2016-04-30 DIAGNOSIS — I10 Essential (primary) hypertension: Secondary | ICD-10-CM

## 2016-04-30 MED ORDER — ONDANSETRON 8 MG PO TBDP: 8.0000 mg | ORAL_TABLET | Freq: Three times a day (TID) | ORAL | Status: DC | PRN

## 2016-04-30 MED ORDER — PROCHLORPERAZINE MALEATE 10 MG PO TABS: 10.0000 mg | ORAL_TABLET | Freq: Four times a day (QID) | ORAL | Status: DC | PRN

## 2016-04-30 NOTE — Patient Instructions (Signed)
[place] at [facility] Discharge Instructions  RECOMMENDATIONS MADE BY THE CONSULTANT AND ANY TEST RESULTS WILL BE SENT TO YOUR REFERRING PHYSICIAN.  Exam done and seen today by [name] Will need to get a CT of your chest Ultrasound of your pelvis-transvaginal area Paracentesis tomorrow at 0845-they will work you in Labs today. Referrral to Gyn oncology Return to see the Doctor in Call the clinic for any concerns or questions.  Thank you for choosing [place] at [facility] to provide your oncology and hematology care.  To afford each patient quality time with our provider, please arrive at least 15 minutes before your scheduled appointment time.   Beginning January 23rd 2017 lab work for the [facility] will be done in the  Main lab at [facility] on 1st floor. If you have a lab appointment with the [facility] please come in thru the  Main Entrance and check in at the main information desk  You need to re-schedule your appointment should you arrive 10 or more minutes late.  We strive to give you quality time with our providers, and arriving late affects you and other patients whose appointments are after yours.  Also, if you no show three or more times for appointments you may be dismissed from the clinic at the providers discretion.     Again, thank you for choosing [facility].  Our hope is that these requests will decrease the amount of time that you wait before being seen by our physicians.       _____________________________________________________________  Should you have questions after your visit to [facility], please contact our office at [phone] between the hours of 8:30 a.m. and 4:30 p.m.  Voicemails left after 4:30 p.m. will not be returned until the following business day.  For prescription refill requests, have your pharmacy contact our office.         Resources For Cancer  Patients and their Caregivers ? American Cancer Society: Can assist with transportation, wigs, general needs, runs Look Good Feel Better.        3464592332 ? Cancer Care: Provides financial assistance, online support groups, medication/co-pay assistance.  1-800-813-HOPE (725)217-6877) ? [place] Assists [place] cancer patients and their families through emotional , educational and financial support.  8387593032 ? [place] DSS Where to apply for food stamps, Medicaid and utility assistance. 782-164-9732 ? [employer]: Transportation to medical appointments. (808)298-7466 ? Social Security Administration: May apply for disability if have a Stage IV cancer. 605-788-6276 534-580-3953 ? [employer], Disability and Transit Services: Assists with nutrition, care and transit needs. [employer] Support Programs: @10RELATIVEDAYS @ > Cancer Support Group  2nd Tuesday of the month 1pm-2pm, Journey Room  > Creative Journey  3rd Tuesday of the month 1130am-1pm, Journey Room  > Look Good Feel Better  1st Wednesday of the month 10am-12 noon, Journey Room (Call [place] to register [phone])

## 2016-04-30 NOTE — Progress Notes (Signed)
[facility] Hematology/Oncology Consultation   Name: Ellen Hunt      MRN: [id number]    Date: 04/30/2016 Time:8:03 PM   REFERRING PHYSICIAN:  Milton Ferguson, MD (ED physcian)  REASON FOR CONSULT:  Abnormal CT abd/pelvis imaging   DIAGNOSIS:  Extensive omental caking with moderate amount of ascites within abdomen  HISTORY OF PRESENT ILLNESS:   Ellen Hunt is a 66 y.o. female with a medical history significant for type II DM, Vit D Deficiency, hyperlipidemia, osteopenia, HTN  who is referred to the [facility] for abnormal CT imaging of abdomen, concerning for malignancy.  I personally reviewed and went over laboratory results with the patient.  The results are noted within this dictation.  ED labs from 04/29/2016 were unremarkable.  I personally reviewed and went over radiographic studies with the patient.  The results are noted within this dictation.  CT imaging of abdomen demonstrates extensive omental caking with moderate amount of ascites and mass-like expansion of the uterine fundus.  Chart reviewed.  She notes a 3-4 week history of abdominal pain.  She presented to her PCP yesterday who performed a plain film of her abdomen.  This was negative, but he set her up for an [place] of abdomen due to concerns for possible gallbladder disease.  She left his office and ate food.  This resulted in vomiting.  She was advised from her PCP to report to the ED and request an [place].  She was evaluated by the ED physician, Dr. Roderic Palau, who felt that pursuing a CT scan was more appropriate.  This was completed and she was therefore referred to [place] as a result.  She was worked into our scheduled today within 24 hours of leaving the ED. CT obtained in the ED noted extensive omental caking as well as moderate ascites comparable with metastatic disease, potentially ovarian or GI in etiology. Masslike expansion of the uterine fundus.   She notes a decrease in appetite, but an  increase in weight.  She admits to abdominal bloating.  She denies any vaginal bleeding.  She does have a history of back surgery by Dr. Carloyn Manner in October 2016.  She notes a history of unilateral leg edema secondary to her back issues, but of late, she has noted B/L LE edema that does not improve by AM which is unusual  Review of Systems  Constitutional: Negative for fever, chills, weight loss and malaise/fatigue.  HENT: Negative.   Eyes: Negative.   Respiratory: Positive for shortness of breath. Negative for cough, sputum production and wheezing.   Cardiovascular: Positive for leg swelling (B/L).  Gastrointestinal: Positive for abdominal pain and diarrhea (chronic). Negative for nausea and vomiting.  Genitourinary: Negative.   Musculoskeletal: Negative.   Skin: Negative.   Neurological: Negative.   Endo/Heme/Allergies: Negative.   Psychiatric/Behavioral: Negative.      PAST MEDICAL HISTORY:   Past Medical History  Diagnosis Date  . Hypertension   . Diabetes mellitus without complication ([place])   . Hyperlipidemia   . Low serum vitamin D   . Abnormal CT of the abdomen 04/30/2016    ALLERGIES: Allergies  Allergen Reactions  . Avandia [Rosiglitazone] Other (See Comments)    Legs swelled  . Micronase [Glyburide] Swelling  . Actos [Pioglitazone] Other (See Comments)    Edema / leg swelling      MEDICATIONS: I have reviewed the patient's current medications.    Current Outpatient Prescriptions on File Prior to Visit  Medication Sig Dispense Refill  . aspirin 81 MG tablet Take 1 tablet (81 mg total) by mouth daily. (Patient taking differently: Take 81 mg by mouth every evening. ) 30 tablet   . atorvastatin (LIPITOR) 40 MG tablet TAKE 1 TABLET(40 MG) BY MOUTH DAILY (Patient taking differently: Take 40 mg by mouth every evening. ) 90 tablet 0  . benazepril (LOTENSIN) 10 MG tablet Take 1 tablet (10 mg total) by mouth daily. (Patient taking differently: Take 10 mg by mouth every evening.  ) 90 tablet 0  . CALCIUM PO Take 1 tablet by mouth 2 (two) times daily.    . furosemide (LASIX) 20 MG tablet Take 1 tablet (20 mg total) by mouth daily. 30 tablet 5  . glucosamine-chondroitin 500-400 MG tablet Take 1 tablet by mouth 2 (two) times daily.    Marland Kitchen glucose blood (ONE TOUCH ULTRA TEST) test strip USE TO CHECK BLOOD SUGAR UP TO 5 TIMES A DAY 450 each 2  . HYDROcodone-acetaminophen (NORCO) 5-325 MG tablet Take 1-2 tablets by mouth every 6 (six) hours as needed for moderate pain. 120 tablet 0  . insulin aspart (NOVOLOG) 100 UNIT/ML injection USE IN INSULIN PUMP AS DIRECTED (Max daily dose is 110 units per day) (Patient taking differently: by Pump Prime route continuous. USE IN INSULIN PUMP AS DIRECTED (Max daily dose is 110 units per day)) 40 mL 3  . metFORMIN (GLUCOPHAGE) 1000 MG tablet TAKE 1 TABLET BY MOUTH TWICE DAILY WITH A MEAL (Patient taking differently: Take 1,000 mg by mouth 2 (two) times daily with a meal. ) 180 tablet 1  . Multiple Vitamin (MULTIVITAMIN WITH MINERALS) TABS Take 1 tablet by mouth daily.     No current facility-administered medications on file prior to visit.     PAST SURGICAL HISTORY Past Surgical History  Procedure Laterality Date  . Cesarean section    . Lumbar fusion  08/21/15    L3-L4 Dr. Timmothy Euler    FAMILY HISTORY: Family History  Problem Relation Age of Onset  . Cancer Mother   . Cancer Father   . Diabetes Paternal Grandmother   . Diabetes Paternal Grandfather     SOCIAL HISTORY:  reports that she has never smoked. She has never used smokeless tobacco. She reports that she does not drink alcohol or use illicit drugs.  Social History   Social History  . Marital Status: Married    Spouse Name: N/A  . Number of Children: N/A  . Years of Education: N/A   Social History Main Topics  . Smoking status: Never Smoker   . Smokeless tobacco: Never Used  . Alcohol Use: No  . Drug Use: No  . Sexual Activity: Not Asked   Other Topics  Concern  . None   Social History Narrative    PERFORMANCE STATUS: The patient's performance status is 1 - Symptomatic but completely ambulatory  PHYSICAL EXAM: Most Recent Vital Signs: Resp. rate 20, height '5\' 4"'  (1.626 m). General appearance: alert, cooperative, appears stated age, no distress, moderately obese and accompanied by her husband. Head: Normocephalic, without obvious abnormality, atraumatic Throat: lips, mucosa, and tongue normal; teeth and gums normal Neck: no adenopathy and supple, symmetrical, trachea midline Lungs: clear to auscultation bilaterally and normal percussion bilaterally Heart: regular rate and rhythm, S1, S2 normal, no murmur, click, rub or gallop Abdomen: abnormal findings:  ascites, distended, obese and woody infiltration of lower abdomen Extremities: edema B/L LE edema, 2+ pitting Skin: Skin color, texture, turgor normal. No rashes  or lesions Lymph nodes: Cervical, supraclavicular, and axillary nodes normal. Neurologic: Grossly normal  LABORATORY DATA:  Results for orders placed or performed during the hospital encounter of 04/29/16 (from the past 48 hour(s))  Lipase, blood     Status: None   Collection Time: 04/29/16  2:05 PM  Result Value Ref Range   Lipase 27 11 - 51 U/L  Comprehensive metabolic panel     Status: Abnormal   Collection Time: 04/29/16  2:05 PM  Result Value Ref Range   Sodium 137 135 - 145 mmol/L   Potassium 4.5 3.5 - 5.1 mmol/L   Chloride 107 101 - 111 mmol/L   CO2 21 (L) 22 - 32 mmol/L   Glucose, Bld 110 (H) 65 - 99 mg/dL   BUN 26 (H) 6 - 20 mg/dL   Creatinine, Ser 0.93 0.44 - 1.00 mg/dL   Calcium 9.8 8.9 - 10.3 mg/dL   Total Protein 6.8 6.5 - 8.1 g/dL   Albumin 3.6 3.5 - 5.0 g/dL   AST 21 15 - 41 U/L   ALT 17 14 - 54 U/L   Alkaline Phosphatase 66 38 - 126 U/L   Total Bilirubin 0.7 0.3 - 1.2 mg/dL   GFR calc non Af Amer >60 >60 mL/min   GFR calc Af Amer >60 >60 mL/min    Comment: (NOTE) The eGFR has been calculated  using the CKD EPI equation. This calculation has not been validated in all clinical situations. eGFR's persistently <60 mL/min signify possible Chronic Kidney Disease.    Anion gap 9 5 - 15  CBC with Differential/Platelet     Status: Abnormal   Collection Time: 04/29/16  2:05 PM  Result Value Ref Range   WBC 9.7 4.0 - 10.5 K/uL   RBC 4.27 3.87 - 5.11 MIL/uL   Hemoglobin 12.3 12.0 - 15.0 g/dL   HCT 38.1 36.0 - 46.0 %   MCV 89.2 78.0 - 100.0 fL   MCH 28.8 26.0 - 34.0 pg   MCHC 32.3 30.0 - 36.0 g/dL   RDW 13.5 11.5 - 15.5 %   Platelets 343 150 - 400 K/uL   Neutrophils Relative % 82 %   Neutro Abs 7.9 (H) 1.7 - 7.7 K/uL   Lymphocytes Relative 8 %   Lymphs Abs 0.7 0.7 - 4.0 K/uL   Monocytes Relative 7 %   Monocytes Absolute 0.7 0.1 - 1.0 K/uL   Eosinophils Relative 3 %   Eosinophils Absolute 0.3 0.0 - 0.7 K/uL   Basophils Relative 0 %   Basophils Absolute 0.0 0.0 - 0.1 K/uL      RADIOGRAPHY: Dg Abd 1 View  04/29/2016  CLINICAL DATA:  Abdominal pain and distension, history of hyperlipidemia and diabetes EXAM: ABDOMEN - 1 VIEW COMPARISON:  None in PACs FINDINGS: The study is limited due to the patient's body habitus and resultant scatter effects. The visualized portions of the bowel exhibited normal stool and gas pattern. No free extraluminal gas collections are observed. The patient has undergone previous lateral and interbody fusion at L4-5. The fusion hardware appears intact. IMPRESSION: Very limited study. No definite acute intra-abdominal abnormality observed. Electronically Signed   By: David  Martinique M.D.   On: 04/29/2016 12:42   Ct Abdomen Pelvis W Contrast  04/29/2016  CLINICAL DATA:  Patient with intermittent abdominal pain for 3-4 weeks. EXAM: CT ABDOMEN AND PELVIS WITH CONTRAST TECHNIQUE: Multidetector CT imaging of the abdomen and pelvis was performed using the standard protocol following bolus administration of intravenous contrast.  CONTRAST:  157m ISOVUE-300 IOPAMIDOL  (ISOVUE-300) INJECTION 61% COMPARISON:  None. FINDINGS: Lower chest: Heart is normal in size. No pericardial effusion. Dependent atelectasis within the bilateral lower lobes. No pleural effusion. Hepatobiliary: Liver is normal in size and contour. No focal hepatic lesions identified. Gallbladder is unremarkable. Pancreas: Unremarkable Spleen: Unremarkable Adrenals/Urinary Tract: Normal adrenal glands. Kidneys enhance symmetrically with contrast. There is a 3 mm nonobstructing stone within the superior pole of the left kidney and a 4 mm nonobstructing stone within the inferior pole of the left kidney. Additional 4 mm stone interpolar region left kidney. Too small to characterize low-attenuation lesion inferior pole right kidney. Urinary bladder is unremarkable. Stomach/Bowel: No abnormal bowel wall thickening or evidence for bowel obstruction. Vascular/Lymphatic: Normal caliber abdominal aorta. Peripheral calcified atherosclerotic plaque. No retroperitoneal lymphadenopathy. Other: Masslike expansion of the uterine fundus. Adnexal structures unremarkable. There is perihepatic and perisplenic ascites. Free fluid within the paracolic gutters and within the pelvis. Omental caking is demonstrated within the anterior abdomen. Musculoskeletal: Lumbar spine degenerative changes. No aggressive or acute appearing osseous lesions. Edema involving the anterior abdominal wall. IMPRESSION: Extensive omental caking as well as moderate amount of ascites within the abdomen most compatible with peritoneal metastatic disease, of unknown primary. This may potentially be ovarian or a GI in etiology. There is masslike expansion of what appears to be the uterine fundus, likely secondary to a fibroid however given the extensive pathology in the abdomen, recommend correlation with pelvic ultrasound for more definitive characterization. Nonobstructing left-sided nephrolithiasis. Aortic atherosclerosis These results were called by telephone at  the time of interpretation on 04/29/2016 at 4:15 pm to Dr. MSabra Heck who verbally acknowledged these results. Electronically Signed   By: DLovey NewcomerM.D.   On: 04/29/2016 16:21       PATHOLOGY:  N/A   ASSESSMENT/PLAN:  Abnormal CT of the abdomen Ascites Omental Caking Nausea/Vomiting  CT abd/pelvis performed on 04/30/2016 performed in the ED due to abdominal pain demonstrating omental caking with ascites and large uterine fundus mass.  I personally reviewed and went over radiographic studies with the patient and her husband. CT imaging report is reviewed and the patient is provided a copy of the report.  Findings are certainly suspicious for malignancy, likely ovarian or primary peritoneal. The patient and her husband are noticeably overwhelmed by the news today. We spent a significant amount of time in discussion regarding how to proceed ie. With diagnosis. I advised them that likely diagnosis can be obtained through her paracentesis in the am via cytology.    Further work-up is needed, CT chest, and lab work.  I personally reviewed and went over laboratory results with the patient.  Lab work from the ED is unimpressive.  Labs today: CA 125 and CEA.  UKoreaparacentesis tomorrow at 9 AM.  This is to be sent for cytology.  Patient's case is discussed with Gyn Onc, MJoylene John NP.  They are agreeable to see the patient next week. 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   Rx for Zofran and compazine provided for the patient today.  RTC once diagnosis established.  I advised the patient and her husband that once imaging is complete, she has seen gyn onc, diagnosis is established we will be in touch to continue to move forward towards appropriate therapy.  I have tentatively scheduled her for follow-up in 2 weeks but anticipate follow-up sooner.   ORDERS PLACED FOR THIS ENCOUNTER: Orders Placed This Encounter  Procedures  . UKoreaParacentesis  .  CT Chest W Contrast  . US Pelvis  Complete  . US Transvaginal Non-OB  . CA 125  . CEA    MEDICATIONS PRESCRIBED THIS ENCOUNTER: Meds ordered this encounter  Medications  . ondansetron (ZOFRAN ODT) 8 MG disintegrating tablet    Sig: Take 1 tablet (8 mg total) by mouth every 8 (eight) hours as needed for nausea or vomiting.    Dispense:  30 tablet    Refill:  2  . prochlorperazine (COMPAZINE) 10 MG tablet    Sig: Take 1 tablet (10 mg total) by mouth every 6 (six) hours as needed for nausea or vomiting.    Dispense:  30 tablet    Refill:  2    All questions were answered. The patient knows to call the clinic with any problems, questions or concerns. We can certainly see the patient much sooner if necessary.  This note is electronically signed Reuben Likes, MD :04/30/2016 8:03 PM

## 2016-04-30 NOTE — Progress Notes (Signed)
Labs drawn per orders.  Patient tolerated well.

## 2016-04-30 NOTE — Assessment & Plan Note (Addendum)
CT abd/pelvis performed on 04/30/2016 performed in the ED due to abdominal pain demonstrating omental caking with ascites and large uterine fundus mass.  I personally reviewed and went over radiographic studies with the patient.  The results are noted within this dictation.  CT imaging report is reviewed and the patient is provided a copy of the report.  Findings are certainly suspicious for malignancy, likely ovarian or primary peritoneal.    Further work-up is needed, including pelvic ultrasound, CT chest, and lab work.  I personally reviewed and went over laboratory results with the patient.  The results are noted within this dictation.  Lab work from the ED is unimpressive.  Labs today: CA 125 and CEA.  US paracentesis tomorrow at 9 AM.  This is to be sent for cytology.  Patient's case is discussed with Gyn Onc, Joylene John, NP.  They are agreeable to see the patient next week.  Rx for Zofran and compazine provided for the patient today.  She will return in ~ 2 weeks for follow-up.

## 2016-05-01 ENCOUNTER — Ambulatory Visit (HOSPITAL_COMMUNITY)
Admission: RE | Admit: 2016-05-01 | Discharge: 2016-05-01 | Disposition: A | Discharge status: Home / Self Care | Payer: BC Managed Care – PPO | Source: Ambulatory Visit | Attending: Oncology | Admitting: Oncology

## 2016-05-01 ENCOUNTER — Encounter (HOSPITAL_COMMUNITY): Payer: Self-pay

## 2016-05-01 ENCOUNTER — Telehealth: Payer: Self-pay | Admitting: Family Medicine

## 2016-05-01 DIAGNOSIS — R935 Abnormal findings on diagnostic imaging of other abdominal regions, including retroperitoneum: Secondary | ICD-10-CM

## 2016-05-01 DIAGNOSIS — R188 Other ascites: Secondary | ICD-10-CM | POA: Diagnosis present

## 2016-05-01 LAB — CEA: CEA: 0.7 ng/mL (ref 0.0–4.7)

## 2016-05-01 LAB — CA 125: CA 125: 7149 U/mL — ABNORMAL HIGH (ref 0.0–38.1)

## 2016-05-01 MED ORDER — INSULIN ASPART 100 UNIT/ML ~~LOC~~ SOLN: SUBCUTANEOUS | Status: DC

## 2016-05-01 NOTE — Progress Notes (Signed)
Paracentesis complete no signs of distress. 1800 ml yellow colored ascites removed.

## 2016-05-01 NOTE — Procedures (Signed)
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

## 2016-05-01 NOTE — Telephone Encounter (Signed)
Spoke with patient regarding ovarian cancer diagnosis.  She is doing well but obviously shaken.  Let her know we are here to help and to call if she needs anything.

## 2016-05-01 NOTE — Discharge Instructions (Signed)
Paracentesis Paracentesis is a procedure to remove excess fluid (ascites) from the belly (abdomen). Ascites can result from certain conditions, such as infection, inflammation, abdominal injury, heart failure, chronic scarring of the liver (cirrhosis), or cancer. Ascites is removed using a needle that is inserted through the skin and tissue into the abdomen. This procedure may be done:  To determine the cause of the ascites.  To relieve symptoms that are caused by the ascites, such as pain or shortness of breath.  To see if there is bleeding after an abdominal injury. LET [facility] CARE PROVIDER KNOW ABOUT:  Any allergies you have.  All medicines you are taking, including vitamins, herbs, eye drops, creams, and over-the-counter medicines.  Previous problems you or members of your family have had with the use of anesthetics.  Any blood disorders you have.  Previous surgeries you have had.  Any medical conditions you have.  Whether you are pregnant or may be pregnant. RISKS AND COMPLICATIONS Generally, this is a safe procedure. However, problems may occur, including:  Infection.  Bleeding.  Injury to an abdominal organ, such as the bowel (large intestine), liver, spleen, or bladder.  Low blood pressure (hypotension).  Spreading of cancer, if there are cancer cells in the abdominal fluid.  Mental status changes in people who have liver disease. These changes would be caused by shifts in the balance of fluids and minerals (electrolytes) in the body. BEFORE THE PROCEDURE  Ask your health care provider about:  Changing or stopping your regular medicines. This is especially important if you are taking diabetes medicines or blood thinners.  Taking medicines such as aspirin and ibuprofen. These medicines can thin your blood. Do not take these medicines before your procedure if your health care provider instructs you not to.  A blood sample may be done to determine your blood  clotting time.  You will be asked to urinate. PROCEDURE  You may be asked to lie on your back with your head raised (elevated).  To reduce your risk of infection:  Your health care team will wash or sanitize their hands.  Your skin will be washed with soap.  You will be given a medicine to numb the area (local anesthetic).  Your abdominal skin will be punctured with a needle or a scalpel.  A drainage tube will be inserted through the puncture site. Fluid will drain through the tube into a container.  After enough fluid has been removed, the tube will be removed.  A sample of the fluid will be sent for examination.  A bandage (dressing) will be placed over the puncture site. The procedure may vary among health care providers and hospitals. AFTER THE PROCEDURE  It is your responsibility to get your test results. Ask your health care provider or the department performing the test when your results will be ready.   This information is not intended to replace advice given to you by your health care provider. Make sure you discuss any questions you have with your health care provider.   Document Released: 05/05/2005 Document Revised: 07/11/2015 Document Reviewed: 01/02/2015 Elsevier Interactive Patient Education 2016 [place].  Paracentesis, Care After Refer to this sheet in the next few weeks. These instructions provide you with information about caring for yourself after your procedure. Your health care provider may also give you more specific instructions. Your treatment has been planned according to current medical practices, but problems sometimes occur. Call your health care provider if you have any problems or questions  after your procedure. WHAT TO EXPECT AFTER THE PROCEDURE After your procedure, it is common to have a small amount of clear fluid coming from the puncture site. HOME CARE INSTRUCTIONS  Return to your normal activities as told by your health care provider.  Ask your health care provider what activities are safe for you.  Take over-the-counter and prescription medicines only as told by your health care provider.  Do not take baths, swim, or use a hot tub until your health care provider approves.  Follow instructions from your health care provider about:  How to take care of your puncture site.  When and how you should change your bandage (dressing).  When you should remove your dressing.  Check your puncture area every day signs of infection. Watch for:  Redness, swelling, or pain.  Fluid, blood, or pus.  Keep all follow-up visits as told by your health care provider. This is important. SEEK MEDICAL CARE IF:  You have redness, swelling, or pain at your puncture site.  You start to have more clear fluid coming from your puncture site.  You have blood or pus coming from your puncture site.  You have chills.  You have a fever. SEEK IMMEDIATE MEDICAL CARE IF:  You develop chest pain or shortness of breath.  You develop increasing pain, discomfort, or swelling in your abdomen.  You feel dizzy or light-headed or you pass out.   This information is not intended to replace advice given to you by your health care provider. Make sure you discuss any questions you have with your health care provider.   Document Released: 03/06/2015 Document Reviewed: 03/06/2015 Elsevier Interactive Patient Education [employer].

## 2016-05-01 NOTE — Telephone Encounter (Signed)
Rx sent to pharmacy.  LM on VM for patient about Rx and encouraged her to call me to talk about cancer diagnosis.

## 2016-05-07 ENCOUNTER — Telehealth (HOSPITAL_COMMUNITY): Payer: Self-pay | Admitting: Oncology

## 2016-05-07 NOTE — Telephone Encounter (Signed)
I have reviewed the patient's CA 125, CEA, and cytology from paracentesis results. I discussed these with her via telephone on her cell phone. She did put herself in on speaker phone so her husband can contribute to conversation. She is appreciative to learn of this information. She does have an appointment with gynecologic oncology on Friday. She has a CT scan of the chest upcoming tomorrow. She notes that her abdomen is full again, but her symptoms are much less than when we first met. I did offer her the opportunity to allow me to set up for another ultrasound guided paracentesis, but given the lack of symptoms at this time, she has declined. She knows to call us if she changes her mind or if symptomatology becomes an issue moving forward.  Nevaen Tredway, PA-C 05/07/2016 5:08 PM

## 2016-05-08 ENCOUNTER — Ambulatory Visit (HOSPITAL_COMMUNITY)
Admission: RE | Admit: 2016-05-08 | Discharge: 2016-05-08 | Disposition: A | Discharge status: Home / Self Care | Payer: BC Managed Care – PPO | Source: Ambulatory Visit | Attending: Oncology | Admitting: Oncology

## 2016-05-08 DIAGNOSIS — R935 Abnormal findings on diagnostic imaging of other abdominal regions, including retroperitoneum: Secondary | ICD-10-CM | POA: Insufficient documentation

## 2016-05-08 DIAGNOSIS — R188 Other ascites: Secondary | ICD-10-CM | POA: Insufficient documentation

## 2016-05-08 MED ORDER — IOPAMIDOL (ISOVUE-300) INJECTION 61%
75.0000 mL | Freq: Once | INTRAVENOUS | Status: AC | PRN
  Administered 2016-05-08: 75 mL via INTRAVENOUS

## 2016-05-09 ENCOUNTER — Encounter: Payer: Self-pay | Admitting: Gynecologic Oncology

## 2016-05-09 ENCOUNTER — Other Ambulatory Visit (HOSPITAL_COMMUNITY): Payer: Self-pay | Admitting: Hematology & Oncology

## 2016-05-09 ENCOUNTER — Ambulatory Visit: Payer: BC Managed Care – PPO | Attending: Gynecologic Oncology | Admitting: Gynecologic Oncology

## 2016-05-09 DIAGNOSIS — Z809 Family history of malignant neoplasm, unspecified: Secondary | ICD-10-CM | POA: Insufficient documentation

## 2016-05-09 DIAGNOSIS — Z794 Long term (current) use of insulin: Secondary | ICD-10-CM | POA: Diagnosis not present

## 2016-05-09 DIAGNOSIS — C481 Malignant neoplasm of specified parts of peritoneum: Secondary | ICD-10-CM | POA: Insufficient documentation

## 2016-05-09 DIAGNOSIS — Z7982 Long term (current) use of aspirin: Secondary | ICD-10-CM | POA: Diagnosis not present

## 2016-05-09 DIAGNOSIS — Z833 Family history of diabetes mellitus: Secondary | ICD-10-CM | POA: Diagnosis not present

## 2016-05-09 DIAGNOSIS — C569 Malignant neoplasm of unspecified ovary: Secondary | ICD-10-CM | POA: Insufficient documentation

## 2016-05-09 DIAGNOSIS — E785 Hyperlipidemia, unspecified: Secondary | ICD-10-CM | POA: Diagnosis not present

## 2016-05-09 DIAGNOSIS — C763 Malignant neoplasm of pelvis: Secondary | ICD-10-CM

## 2016-05-09 DIAGNOSIS — I10 Essential (primary) hypertension: Secondary | ICD-10-CM | POA: Insufficient documentation

## 2016-05-09 DIAGNOSIS — C578 Malignant neoplasm of overlapping sites of female genital organs: Secondary | ICD-10-CM | POA: Diagnosis present

## 2016-05-09 DIAGNOSIS — B029 Zoster without complications: Secondary | ICD-10-CM | POA: Diagnosis not present

## 2016-05-09 DIAGNOSIS — E1165 Type 2 diabetes mellitus with hyperglycemia: Secondary | ICD-10-CM | POA: Insufficient documentation

## 2016-05-09 HISTORY — DX: Malignant neoplasm of unspecified ovary: C56.9

## 2016-05-09 HISTORY — DX: Malignant neoplasm of specified parts of peritoneum: C48.1

## 2016-05-09 MED ORDER — VALACYCLOVIR HCL 1 G PO TABS: 1000.0000 mg | ORAL_TABLET | Freq: Three times a day (TID) | ORAL | Status: DC

## 2016-05-09 NOTE — Progress Notes (Signed)
Consult Note: Gyn-Onc  Consult was requested by Dr. Whitney Muse for the evaluation of Ellen Hunt 65 y.o. female  CC:  Chief Complaint  Patient presents with  . High Grade serous carcinoma    New Consultation    Assessment/Plan:  Ellen Hunt  is a 65 y.o.  year old with stage IIIC ovarian/primary peritoneal/fallopian tube cancer.  I reviewed her CT images with the patient.   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 I discussed that the treatment approach for this disease is typically combination of cytoreductive surgery and chemotherapy. I discussed that sequencing of this can be either with upfront debulking followed by adjuvant chemotherapy sequentially or neoadjuvant chemotherapy followed by an interval cytoreductive attempt, then additional chemotherapy. This latter approach is associated with a reduced perioperative morbidity at the time of surgery. I discussed that the goal of optimal sequencing is to optimise the likelihood that cytoreductive effect can be optimal to less than 1 cm of residual disease, and would not induce morbidity for the patient that would result in a delay of adjuvant chemotherapy. I discussed that it is an individual decision process that takes into account individual patient health, and preference factors, in addition to the apparent tumor distribution on imaging. I discussed that the overall survival observed in patients is equivalent for both approaches provided that there is an optimal cytoreductive effort at the time of surgery (regardless of the timing of that surgery).  In her case, her extreme obesity and poorly controlled diabetes mellitus places her at increased perioperative risk and carries with it substantial (50%) risk for wound infection and dehiscence which can delay chemotherapy. For this reason the patient is electing for the approach of neoadjuvant chemotherapy for 3 cycles of q 21 day carboplatin AUC 6 and paclitaxel  175mg /m2. I recommend CT scan 1 week after day 1 of cycle 3 and I will see her within this cycle to review her CT images and plan for interval debulking surgery approximately 3 weeks after cycle 3 (day 1).   The patient has symptoms consistent with right periocular Shingles. I have prescribed Valtrex 1000mg  TID x 7 days.  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  She has a history of feeling bloated and extended since June 1st, 2017. She was seen in the ED on 04/30/16 and a CT scan of the abdomen and pelvis was performed it revealed moderate volume ascites, masslike expansion of the uterine fundus, no adnexal masses seen. There was extensive omental caking compatible with peritoneal metastatic disease. No pathologic adenopathy was identified. 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 The patient was seen and evaluated by Dr. Whitney Muse with a plan in place for chemotherapy when appropriate.   She is morbidly obese with a weight of 247lbs. She has poorly controlled DM (last HbA1C was >9%) though she has more recently been on tighter control of her glucose with metformin and insulin.  Interval History: she continues to feel bloated, she has a poor appetite.  In the past 24 hours she has developed a painful rash over her eyelid and right temporal skin.  Current Meds:  Outpatient Encounter Prescriptions as of 05/09/2016  Medication Sig  . aspirin 81 MG tablet Take 1 tablet (81 mg total) by mouth daily. (Patient  taking differently: Take 81 mg by mouth every evening. )  . atorvastatin (LIPITOR) 40 MG tablet TAKE 1 TABLET(40 MG) BY MOUTH DAILY (Patient taking differently: Take 40 mg by mouth every evening. )  . benazepril (LOTENSIN) 10 MG tablet Take 1 tablet (10  mg total) by mouth daily. (Patient taking differently: Take 10 mg by mouth every evening. )  . CALCIUM PO Take 1 tablet by mouth 2 (two) times daily.  . furosemide (LASIX) 20 MG tablet Take 1 tablet (20 mg total) by mouth daily.  Marland Kitchen glucosamine-chondroitin 500-400 MG tablet Take 1 tablet by mouth 2 (two) times daily.  Marland Kitchen glucose blood (ONE TOUCH ULTRA TEST) test strip USE TO CHECK BLOOD SUGAR UP TO 5 TIMES A DAY  . HYDROcodone-acetaminophen (NORCO) 5-325 MG tablet Take 1-2 tablets by mouth every 6 (six) hours as needed for moderate pain.  Marland Kitchen insulin aspart (NOVOLOG) 100 UNIT/ML injection USE IN INSULIN PUMP AS DIRECTED (Max daily dose is 110 units per day)  . metFORMIN (GLUCOPHAGE) 1000 MG tablet TAKE 1 TABLET BY MOUTH TWICE DAILY WITH A MEAL (Patient taking differently: Take 1,000 mg by mouth 2 (two) times daily with a meal. )  . Multiple Vitamin (MULTIVITAMIN WITH MINERALS) TABS Take 1 tablet by mouth daily.  . prochlorperazine (COMPAZINE) 10 MG tablet Take 1 tablet (10 mg total) by mouth every 6 (six) hours as needed for nausea or vomiting.  . ondansetron (ZOFRAN ODT) 8 MG disintegrating tablet Take 1 tablet (8 mg total) by mouth every 8 (eight) hours as needed for nausea or vomiting. (Patient not taking: Reported on 05/09/2016)  . valACYclovir (VALTREX) 1000 MG tablet Take 1 tablet (1,000 mg total) by mouth 3 (three) times daily.   No facility-administered encounter medications on file as of 05/09/2016.    Allergy:  Allergies  Allergen Reactions  . Avandia [Rosiglitazone] Other (See Comments)    Legs swelled  . Micronase [Glyburide] Swelling  . Actos [Pioglitazone] Other (See Comments)    Edema / leg swelling    Social Hx:   Social History   Social History  . Marital Status: Married    Spouse Name: N/A  . Number of Children: N/A  . Years of Education: N/A   Occupational History  . Not on file.   Social History Main Topics  . Smoking status: Never Smoker   . Smokeless tobacco:  Never Used  . Alcohol Use: No  . Drug Use: No  . Sexual Activity: Not on file   Other Topics Concern  . Not on file   Social History Narrative    Past Surgical Hx:  Past Surgical History  Procedure Laterality Date  . Cesarean section    . Lumbar fusion  08/21/15    L3-L4 Dr. Timmothy Euler    Past Medical Hx:  Past Medical History  Diagnosis Date  . Hypertension   . Diabetes mellitus without complication ([place])   . Hyperlipidemia   . Low serum vitamin D   . Abnormal CT of the abdomen 04/30/2016    Past Gynecological History:  No LMP recorded. Patient is postmenopausal.  Family Hx:  Family History  Problem Relation Age of Onset  . Cancer Mother   . Cancer Father   . Diabetes Paternal Grandmother   . Diabetes Paternal Grandfather     Review of Systems:  Constitutional  Feels fatigued and bloated  ENT Normal appearing ears and nares bilaterally Skin/Breast  + right temporal and eyebrow vesicular rash Cardiovascular  No  chest pain, shortness of breath, or edema  Pulmonary  No cough or wheeze.  Gastro Intestinal  + bloating, nausea, early satiety Genito Urinary  No frequency, urgency, dysuria, no bleeding Musculo Skeletal  No myalgia, arthralgia, joint swelling or pain  Neurologic  No weakness, numbness, change in gait,  Psychology  No depression, anxiety, insomnia.   Vitals:  There were no vitals taken for this visit.  Physical Exam: WD in NAD Neck  Supple NROM, without any enlargements.  Lymph Node Survey No cervical supraclavicular or inguinal adenopathy Cardiovascular  Pulse normal rate, regularity and rhythm. S1 and S2 normal.  Lungs  Clear to auscultation bilateraly, without wheezes/crackles/rhonchi. Good air movement.  Skin  No rash/lesions/breakdown  Psychiatry  Alert and oriented to person, place, and time  Abdomen  Normoactive bowel sounds, abdomen soft, non-tender and obese without evidence of hernia. Vertical midline incision from  prior cesarean. Back No CVA tenderness Genito Urinary  Vulva/vagina: Normal external female genitalia.  No lesions. No discharge or bleeding.  Bladder/urethra:  No lesions or masses, well supported bladder  Vagina: normal  Cervix: Normal appearing, no lesions.  Uterus: Small, mobile, no parametrial involvement or nodularity.  Adnexa: no palpable masses. Rectal  Good tone, no masses no cul de sac nodularity.  Extremities  No bilateral cyanosis, clubbing or edema.   Donaciano Eva, MD  05/09/2016, 3:32 PM

## 2016-05-09 NOTE — Patient Instructions (Signed)
Plan to proceed with chemotherapy with Dr. Whitney Muse and follow up with Dr. Denman George after three cycles to discuss potential surgery.  Begin taking Valtrex 1000 mg three times daily for seven days.   Please call for any questions or concerns.

## 2016-05-10 MED ORDER — LIDOCAINE-PRILOCAINE 2.5-2.5 % EX CREA: TOPICAL_CREAM | CUTANEOUS | Status: DC

## 2016-05-10 MED ORDER — DEXAMETHASONE 4 MG PO TABS: ORAL_TABLET | ORAL | Status: DC

## 2016-05-12 ENCOUNTER — Ambulatory Visit (INDEPENDENT_AMBULATORY_CARE_PROVIDER_SITE_OTHER): Payer: BC Managed Care – PPO | Admitting: Pharmacist

## 2016-05-12 ENCOUNTER — Other Ambulatory Visit (HOSPITAL_COMMUNITY): Payer: Self-pay | Admitting: *Deleted

## 2016-05-12 ENCOUNTER — Other Ambulatory Visit: Payer: Self-pay | Admitting: Radiology

## 2016-05-12 VITALS — BP 130/78 | HR 74

## 2016-05-12 DIAGNOSIS — Z794 Long term (current) use of insulin: Secondary | ICD-10-CM | POA: Diagnosis not present

## 2016-05-12 DIAGNOSIS — C569 Malignant neoplasm of unspecified ovary: Secondary | ICD-10-CM

## 2016-05-12 DIAGNOSIS — E119 Type 2 diabetes mellitus without complications: Secondary | ICD-10-CM | POA: Diagnosis not present

## 2016-05-12 LAB — BAYER DCA HB A1C WAIVED: HB A1C (BAYER DCA - WAIVED): 6.8 % (ref <7.0)

## 2016-05-12 MED ORDER — GLUCAGON (RDNA) 1 MG IJ KIT: PACK | INTRAMUSCULAR | Status: DC

## 2016-05-12 MED ORDER — ONDANSETRON HCL 8 MG PO TABS: 8.0000 mg | ORAL_TABLET | Freq: Three times a day (TID) | ORAL | Status: DC | PRN

## 2016-05-12 NOTE — Progress Notes (Signed)
Diabetes Follow-Up Visit  Chief Complaint:   Chief Complaint  Patient presents with  . Diabetes    Filed Vitals:   05/12/16 1422  BP: 130/78  Pulse: 74    HPI:  Patient last seen 03/10/2016 for uncontrolled DM and insulin pump adjustment. Since that visit patient began to have abdominal pain and swelling.  After being referred for CT of abdomen and ER visit.  Patient has been diagnosed with peritoneal / ovarian/ fallopian tube carcinoma.  She is to start chemotherapy in 4 days.  Surgery is planned after chemo.    She is currently using medtronic insulin pump Paradigm 722.  Uses Novolog insulin in pump.  Also takes metformin 1019m bid with food. She stopped Trulicity and Ivnokana because she was not feeling well. But this might have really been related to cancer. She food intake has decreased due to pressure from fluid retention on her stomach.  As a result patient has been experiencing some hypoglycemia - has had about 4 events with lowest around 66. She adjusted her pump setting 2 days ago to what is listed below and has not had a low since.  Current Insulin Pump Settings:        Basal:  MN to 2am = 2.00                    2am to 8am = 2.25                    8am to 5pm = 2.25                    5pm to MN = 2.10       Insulin to CHO ratio =  5       Insulin Sensitivity = 13  Home BG Monitoring:  Checking 4 to  times a day. 14 day avg = 113 +/- 26 Increase frequency of hypoglycemia related to nausea secondary to fluid retention and cancer diagnosis.  2 readings in the 60's (one during the night and one in the late morning)  Average total daily insulin = 83 +/- 8.3 units  Low fat/carbohydrate diet?  Yes most days Nicotine Abuse?  No Medication Compliance?  Yes Exercise?  Yes - walking 1-2 times per week - very leisurely mall walking. Alcohol use?  No   Lab Results  Component Value Date   HGBA1C 6.8 Today - 05/12/2016   HGBA1C 8.4 02/06/2016   HGBA1C 6.8 10/03/2015   HGBA1C 7.5 03/29/2015   HGBA1C 8.1 12/08/2014        Assessment: 1.  Diabetes - uncontrolled 2.  Blood Pressure.  At goal today   Recommendations: 1.  Medication recommendations at this time are as follows:   Insulin Pump Settings - no change         Basal:  MN to 2am = 2.00                    2am to 8am = 2.25                    8am to 5pm = 2.25                    5pm to MN = 2.10       Insulin to CHO ratio = 8       Insulin Sensitivity = 13  2.  Reviewed HBG goals:  Fasting 80-130 and  1-2 hour post prandial <180.  Patient is instructed to check BG 4 to 5 times per day.    3.  Answered several of patient's questions about pre chemo meds - dexamethasone, zofran.  She also has class in 2 days regarding what to expect with chemo.  4.  Reviewed s/s of hypoglyecmia and how to treat.  Rx for glucagon kit sent to her pharmacy.  Patient is to call for instructions if she has another hypoglycemic event.   Orders Placed This Encounter  Procedures  . Bayer DCA Hb A1c Waived    Time spent counseling patient:  30 minutes   Cherre Robins, PharmD, CPP, CDE

## 2016-05-13 ENCOUNTER — Ambulatory Visit (HOSPITAL_COMMUNITY)
Admission: RE | Admit: 2016-05-13 | Discharge: 2016-05-13 | Disposition: A | Discharge status: Home / Self Care | Payer: BC Managed Care – PPO | Source: Ambulatory Visit | Attending: Hematology & Oncology | Admitting: Hematology & Oncology

## 2016-05-13 ENCOUNTER — Other Ambulatory Visit (HOSPITAL_COMMUNITY): Payer: Self-pay | Admitting: Emergency Medicine

## 2016-05-13 ENCOUNTER — Encounter (HOSPITAL_COMMUNITY): Payer: Self-pay

## 2016-05-13 ENCOUNTER — Telehealth (HOSPITAL_COMMUNITY): Payer: Self-pay | Admitting: Emergency Medicine

## 2016-05-13 ENCOUNTER — Other Ambulatory Visit (HOSPITAL_COMMUNITY): Payer: Self-pay | Admitting: Hematology & Oncology

## 2016-05-13 ENCOUNTER — Ambulatory Visit: Payer: Self-pay | Admitting: Family Medicine

## 2016-05-13 ENCOUNTER — Telehealth (HOSPITAL_COMMUNITY): Payer: Self-pay | Admitting: Hematology & Oncology

## 2016-05-13 DIAGNOSIS — C569 Malignant neoplasm of unspecified ovary: Secondary | ICD-10-CM | POA: Diagnosis present

## 2016-05-13 DIAGNOSIS — C5700 Malignant neoplasm of unspecified fallopian tube: Secondary | ICD-10-CM | POA: Diagnosis not present

## 2016-05-13 DIAGNOSIS — Z7984 Long term (current) use of oral hypoglycemic drugs: Secondary | ICD-10-CM | POA: Insufficient documentation

## 2016-05-13 DIAGNOSIS — Z9221 Personal history of antineoplastic chemotherapy: Secondary | ICD-10-CM | POA: Insufficient documentation

## 2016-05-13 DIAGNOSIS — Z794 Long term (current) use of insulin: Secondary | ICD-10-CM | POA: Insufficient documentation

## 2016-05-13 DIAGNOSIS — E119 Type 2 diabetes mellitus without complications: Secondary | ICD-10-CM | POA: Insufficient documentation

## 2016-05-13 DIAGNOSIS — Z981 Arthrodesis status: Secondary | ICD-10-CM | POA: Diagnosis not present

## 2016-05-13 DIAGNOSIS — I10 Essential (primary) hypertension: Secondary | ICD-10-CM | POA: Insufficient documentation

## 2016-05-13 DIAGNOSIS — E785 Hyperlipidemia, unspecified: Secondary | ICD-10-CM | POA: Insufficient documentation

## 2016-05-13 DIAGNOSIS — Z7982 Long term (current) use of aspirin: Secondary | ICD-10-CM | POA: Diagnosis not present

## 2016-05-13 DIAGNOSIS — B029 Zoster without complications: Secondary | ICD-10-CM | POA: Insufficient documentation

## 2016-05-13 LAB — BASIC METABOLIC PANEL
ANION GAP: 10 (ref 5–15)
BUN: 25 mg/dL — AB (ref 6–20)
CHLORIDE: 106 mmol/L (ref 101–111)
CO2: 17 mmol/L — ABNORMAL LOW (ref 22–32)
Calcium: 8.7 mg/dL — ABNORMAL LOW (ref 8.9–10.3)
Creatinine, Ser: 1.39 mg/dL — ABNORMAL HIGH (ref 0.44–1.00)
GFR calc Af Amer: 46 mL/min — ABNORMAL LOW (ref >60)
GFR calc non Af Amer: 39 mL/min — ABNORMAL LOW (ref >60)
Glucose, Bld: 150 mg/dL — ABNORMAL HIGH (ref 65–99)
POTASSIUM: 4.9 mmol/L (ref 3.5–5.1)
SODIUM: 133 mmol/L — AB (ref 135–145)

## 2016-05-13 LAB — CBC
HEMATOCRIT: 34.6 % — AB (ref 36.0–46.0)
HEMOGLOBIN: 11.4 g/dL — AB (ref 12.0–15.0)
MCH: 29.4 pg (ref 26.0–34.0)
MCHC: 32.9 g/dL (ref 30.0–36.0)
MCV: 89.2 fL (ref 78.0–100.0)
Platelets: 396 10*3/uL (ref 150–400)
RBC: 3.88 MIL/uL (ref 3.87–5.11)
RDW: 14.1 % (ref 11.5–15.5)
WBC: 9.2 10*3/uL (ref 4.0–10.5)

## 2016-05-13 LAB — GLUCOSE, CAPILLARY: GLUCOSE-CAPILLARY: 136 mg/dL — AB (ref 65–99)

## 2016-05-13 LAB — APTT: aPTT: 29 seconds (ref 24–37)

## 2016-05-13 LAB — PROTIME-INR
INR: 1.08 (ref 0.00–1.49)
Prothrombin Time: 14.2 seconds (ref 11.6–15.2)

## 2016-05-13 MED ORDER — FENTANYL CITRATE (PF) 100 MCG/2ML IJ SOLN
INTRAMUSCULAR | Status: AC | PRN
  Administered 2016-05-13 (×2): 25 ug via INTRAVENOUS
  Administered 2016-05-13: 50 ug via INTRAVENOUS

## 2016-05-13 MED ORDER — HEPARIN SOD (PORK) LOCK FLUSH 100 UNIT/ML IV SOLN
INTRAVENOUS | Status: AC
  Filled 2016-05-13: qty 5

## 2016-05-13 MED ORDER — MIDAZOLAM HCL 2 MG/2ML IJ SOLN
INTRAMUSCULAR | Status: AC | PRN
  Administered 2016-05-13 (×5): 1 mg via INTRAVENOUS

## 2016-05-13 MED ORDER — LIDOCAINE HCL 1 % IJ SOLN
INTRAMUSCULAR | Status: AC | PRN
  Administered 2016-05-13: 10 mL via INTRADERMAL

## 2016-05-13 MED ORDER — LIDOCAINE HCL 1 % IJ SOLN
INTRAMUSCULAR | Status: AC
  Filled 2016-05-13: qty 20

## 2016-05-13 MED ORDER — CEFAZOLIN SODIUM-DEXTROSE 2-4 GM/100ML-% IV SOLN
2.0000 g | INTRAVENOUS | Status: AC
Start: 2016-05-13 — End: 2016-05-13
  Administered 2016-05-13: 2 g via INTRAVENOUS
  Filled 2016-05-13: qty 100

## 2016-05-13 MED ORDER — MIDAZOLAM HCL 2 MG/2ML IJ SOLN
INTRAMUSCULAR | Status: AC
Start: 2016-05-13 — End: 2016-05-13
  Filled 2016-05-13: qty 6

## 2016-05-13 MED ORDER — FENTANYL CITRATE (PF) 100 MCG/2ML IJ SOLN
INTRAMUSCULAR | Status: AC
  Filled 2016-05-13: qty 4

## 2016-05-13 MED ORDER — SODIUM CHLORIDE 0.9 % IV SOLN
INTRAVENOUS | Status: DC
  Administered 2016-05-13: 08:00:00 via INTRAVENOUS

## 2016-05-13 MED ORDER — HEPARIN SOD (PORK) LOCK FLUSH 100 UNIT/ML IV SOLN
INTRAVENOUS | Status: AC | PRN
  Administered 2016-05-13: 500 [IU]

## 2016-05-13 NOTE — Procedures (Signed)
Interventional Radiology Procedure Note  Procedure: Placement of a right IJ approach single lumen PowerPort.  Tip is positioned at the superior cavoatrial junction and catheter is ready for immediate use.  Complications: No immediate Recommendations:  - Ok to shower tomorrow - Do not submerge for 7 days - Routine line care   Rocky Rishel T. Diany Formosa, M.D Pager:  [phone]   

## 2016-05-13 NOTE — Telephone Encounter (Signed)
Husband called and was worried that pts shingles was going to interfer with the dexamethasone that she was going to start before chemotherapy. Spoke with Dr Whitney Muse.  Called husband back.  It is ok for her to start taking the dexamethasone, and notified them that her CT chest was negative

## 2016-05-13 NOTE — Discharge Instructions (Signed)
Implanted Port Home Guide °An implanted port is a type of central line that is placed under the skin. Central lines are used to provide IV access when treatment or nutrition needs to be given through a person's veins. Implanted ports are used for long-term IV access. An implanted port may be placed because:  °· You need IV medicine that would be irritating to the small veins in your hands or arms.   °· You need long-term IV medicines, such as antibiotics.   °· You need IV nutrition for a long period.   °· You need frequent blood draws for lab tests.   °· You need dialysis.   °Implanted ports are usually placed in the chest area, but they can also be placed in the upper arm, the abdomen, or the leg. An implanted port has two main parts:  °· Reservoir. The reservoir is round and will appear as a small, raised area under your skin. The reservoir is the part where a needle is inserted to give medicines or draw blood.   °· Catheter. The catheter is a thin, flexible tube that extends from the reservoir. The catheter is placed into a large vein. Medicine that is inserted into the reservoir goes into the catheter and then into the vein.   °HOW WILL I CARE FOR MY INCISION SITE? °Do not get the incision site wet. Bathe or shower as directed by your health care provider.  °HOW IS MY PORT ACCESSED? °Special steps must be taken to access the port:  °· Before the port is accessed, a numbing cream can be placed on the skin. This helps numb the skin over the port site.   °· Your health care provider uses a sterile technique to access the port. °· Your health care provider must put on a mask and sterile gloves. °· The skin over your port is cleaned carefully with an antiseptic and allowed to dry. °· The port is gently pinched between sterile gloves, and a needle is inserted into the port. °· Only "non-coring" port needles should be used to access the port. Once the port is accessed, a blood return should be checked. This helps  ensure that the port is in the vein and is not clogged.   °· If your port needs to remain accessed for a constant infusion, a clear (transparent) bandage will be placed over the needle site. The bandage and needle will need to be changed every week, or as directed by your health care provider.   °· Keep the bandage covering the needle clean and dry. Do not get it wet. Follow your health care provider's instructions on how to take a shower or bath while the port is accessed.   °· If your port does not need to stay accessed, no bandage is needed over the port.   °WHAT IS FLUSHING? °Flushing helps keep the port from getting clogged. Follow your health care provider's instructions on how and when to flush the port. Ports are usually flushed with saline solution or a medicine called heparin. The need for flushing will depend on how the port is used.  °· If the port is used for intermittent medicines or blood draws, the port will need to be flushed:   °· After medicines have been given.   °· After blood has been drawn.   °· As part of routine maintenance.   °· If a constant infusion is running, the port may not need to be flushed.   °HOW LONG WILL MY PORT STAY IMPLANTED? °The port can stay in for as long as your health care   provider thinks it is needed. When it is time for the port to come out, surgery will be done to remove it. The procedure is similar to the one performed when the port was put in.  °WHEN SHOULD I SEEK IMMEDIATE MEDICAL CARE? °When you have an implanted port, you should seek immediate medical care if:  °· You notice a bad smell coming from the incision site.   °· You have swelling, redness, or drainage at the incision site.   °· You have more swelling or pain at the port site or the surrounding area.   °· You have a fever that is not controlled with medicine. °  °This information is not intended to replace advice given to you by your health care provider. Make sure you discuss any questions you have with  your health care provider. °  °Document Released: 10/20/2005 Document Revised: 08/10/2013 Document Reviewed: 06/27/2013 °Elsevier Interactive Patient Education ©2016 Elsevier Inc. ° °Moderate Conscious Sedation, Adult, Care After °Refer to this sheet in the next few weeks. These instructions provide you with information on caring for yourself after your procedure. Your health care provider may also give you more specific instructions. Your treatment has been planned according to current medical practices, but problems sometimes occur. Call your health care provider if you have any problems or questions after your procedure. °WHAT TO EXPECT AFTER THE PROCEDURE  °After your procedure: °· You may feel sleepy, clumsy, and have poor balance for several hours. °· Vomiting may occur if you eat too soon after the procedure. °HOME CARE INSTRUCTIONS °· Do not participate in any activities where you could become injured for at least 24 hours. Do not: °¨ Drive. °¨ Swim. °¨ Ride a bicycle. °¨ Operate heavy machinery. °¨ Cook. °¨ Use power tools. °¨ Climb ladders. °¨ Work from a high place. °· Do not make important decisions or sign legal documents until you are improved. °· If you vomit, drink water, juice, or soup when you can drink without vomiting. Make sure you have little or no nausea before eating solid foods. °· Only take over-the-counter or prescription medicines for pain, discomfort, or fever as directed by your health care provider. °· Make sure you and your family fully understand everything about the medicines given to you, including what side effects may occur. °· You should not drink alcohol, take sleeping pills, or take medicines that cause drowsiness for at least 24 hours. °· If you smoke, do not smoke without supervision. °· If you are feeling better, you may resume normal activities 24 hours after you were sedated. °· Keep all appointments with your health care provider. °SEEK MEDICAL CARE IF: °· Your skin is  pale or bluish in color. °· You continue to feel nauseous or vomit. °· Your pain is getting worse and is not helped by medicine. °· You have bleeding or swelling. °· You are still sleepy or feeling clumsy after 24 hours. °SEEK IMMEDIATE MEDICAL CARE IF: °· You develop a rash. °· You have difficulty breathing. °· You develop any type of allergic problem. °· You have a fever. °MAKE SURE YOU: °· Understand these instructions. °· Will watch your condition. °· Will get help right away if you are not doing well or get worse. °  °This information is not intended to replace advice given to you by your health care provider. Make sure you discuss any questions you have with your health care provider. °  °Document Released: 08/10/2013 Document Revised: 11/10/2014 Document Reviewed: 08/10/2013 °Elsevier Interactive Patient   Education ©2016 Elsevier Inc. ° °

## 2016-05-13 NOTE — Progress Notes (Signed)
Patient ID: Ellen Hunt, female   DOB: [birth year], 65 y.o.   MRN: [id number]    Referring Physician(s): Patrici Ranks  Supervising Physician: Aletta Edouard  Patient Status:  Outpatient  Chief Complaint:  "I'm here to have a port a cath put in"  Subjective:  Patient familiar to IR service from prior paracentesis on 05/01/16. She has newly diagnosed stage IIIc ovarian/primary peritoneal/fallopian tube cancer and presents again today for Port-A-Cath placement for chemotherapy. Of note patient also has history of recently diagnosed right periocular shingles and is on Valtrex therapy. She currently denies fever, back pain, vomiting or abnormal bleeding. She does note occasional headaches, chest pressure , dyspnea with exertion as well as cough, abdominal distention/bloating/fullness and occasional nausea. Additional history as listed below. Past Medical History  Diagnosis Date  . Hypertension   . Diabetes mellitus without complication ([place])   . Hyperlipidemia   . Low serum vitamin D   . Abnormal CT of the abdomen 04/30/2016   Past Surgical History  Procedure Laterality Date  . Cesarean section    . Lumbar fusion  08/21/15    L3-L4 Dr. Timmothy Euler     Allergies: Avandia; Micronase; and Actos  Medications: Prior to Admission medications   Medication Sig Start Date End Date Taking? Authorizing Provider  aspirin 81 MG tablet Take 1 tablet (81 mg total) by mouth daily. Patient taking differently: Take 81 mg by mouth every evening.  04/21/14  Yes Tammy Eckard, PHARMD  atorvastatin (LIPITOR) 40 MG tablet TAKE 1 TABLET(40 MG) BY MOUTH DAILY Patient taking differently: Take 40 mg by mouth every evening.  03/10/16  Yes Tammy Eckard, PHARMD  benazepril (LOTENSIN) 10 MG tablet Take 1 tablet (10 mg total) by mouth daily. Patient taking differently: Take 10 mg by mouth every evening.  03/10/16  Yes Tammy Eckard, PHARMD  CALCIUM PO Take 1 tablet by mouth 2 (two) times daily.   Yes  Historical Provider, MD  CARBOPLATIN IV Inject into the vein. Every 21 days   Yes Historical Provider, MD  glucagon (GLUCAGON EMERGENCY) 1 MG injection Inject as directed as needed for low blood glucose if unresponsive. 05/12/16  Yes Tammy Eckard, PHARMD  glucosamine-chondroitin 500-400 MG tablet Take 1 tablet by mouth 2 (two) times daily.   Yes Historical Provider, MD  HYDROcodone-acetaminophen (NORCO) 5-325 MG tablet Take 1-2 tablets by mouth every 6 (six) hours as needed for moderate pain. 08/06/15  Yes Chipper Herb, MD  insulin aspart (NOVOLOG) 100 UNIT/ML injection USE IN INSULIN PUMP AS DIRECTED (Max daily dose is 110 units per day) 05/01/16  Yes [name], MD  metFORMIN (GLUCOPHAGE) 1000 MG tablet TAKE 1 TABLET BY MOUTH TWICE DAILY WITH A MEAL Patient taking differently: Take 1,000 mg by mouth 2 (two) times daily with a meal.  01/11/16  Yes [name], MD  Multiple Vitamin (MULTIVITAMIN WITH MINERALS) TABS Take 1 tablet by mouth daily.   Yes Historical Provider, MD  ondansetron (ZOFRAN) 8 MG tablet Take 1 tablet (8 mg total) by mouth every 8 (eight) hours as needed for nausea or vomiting. 05/12/16  Yes Patrici Ranks, MD  valACYclovir (VALTREX) 1000 MG tablet Take 1 tablet (1,000 mg total) by mouth 3 (three) times daily. 05/09/16 05/16/16 Yes Everitt Amber, MD  dexamethasone (DECADRON) 4 MG tablet The day before chemo take 5 tabs (20mg  total) in the am and 5 tabs (20mg  total) in the pm. The morning of chemo take 5 tabs (20mg  total). 05/10/16   Larene Beach  K Penland, MD  furosemide (LASIX) 20 MG tablet Take 1 tablet (20 mg total) by mouth daily. 10/03/15   [name], MD  glucose blood (ONE TOUCH ULTRA TEST) test strip USE TO CHECK BLOOD SUGAR UP TO 5 TIMES A DAY 01/11/16   [name], MD  lidocaine-prilocaine (EMLA) cream Apply a quarter size amount to port site 1 hour prior to chemo. Do not rub in. Cover with plastic wrap. 05/10/16   Patrici Ranks, MD  PACLitaxel (TAXOL IV)  Inject into the vein. Every 21 days    Historical Provider, MD  prochlorperazine (COMPAZINE) 10 MG tablet Take 1 tablet (10 mg total) by mouth every 6 (six) hours as needed for nausea or vomiting. 04/30/16   Patrici Ranks, MD     Vital Signs:Blood pressure 133/44, heart rate 86, respirations 20, temp 97.7, O2 sat 95% room air   Physical Exam patient awake, alert. Chest clear to auscultation bilaterally. Heart with regular rate and rhythm. Abdomen obese, distended, few bowel sounds, mild generalized tenderness; lower extremities with full range of motion/edema noted; right periocular/forehead shingles present with crusting lesions  Imaging: No results found.  Labs:  CBC:  Recent Labs  04/29/16 1405 05/13/16 0736  WBC 9.7 9.2  HGB 12.3 11.4*  HCT 38.1 34.6*  PLT 343 396    COAGS:  Recent Labs  05/13/16 0736  INR 1.08  APTT 29    BMP:  Recent Labs  07/02/15 1005 02/06/16 1148 04/29/16 1405 05/13/16 0736  NA 139 139 137 133*  K 4.6 4.8 4.5 4.9  CL 98 99 107 106  CO2 23 20 21* 17*  GLUCOSE 109* 187* 110* 150*  BUN 17 21 26* 25*  CALCIUM 10.0 9.5 9.8 8.7*  CREATININE 0.89 0.91 0.93 1.39*  GFRNONAA 70 68 >60 39*  GFRAA 80 78 >60 46*    LIVER FUNCTION TESTS:  Recent Labs  07/02/15 1005 04/29/16 1405  BILITOT 0.3 0.7  AST 21 21  ALT 21 17  ALKPHOS 65 66  PROT 7.0 6.8  ALBUMIN 4.6 3.6    Assessment and Plan: Patient with history of recent diagnosed stage IIIc ovarian/primary peritoneal/fallopian tube cancer. Also under treatment for right periocular shingles. She presents today for Port-A-Cath placement prior to chemotherapy.Risks and benefits discussed with the patient/husband including, but not limited to bleeding, infection, pneumothorax, or fibrin sheath development and need for additional procedures.All of the patient's questions were answered, patient is agreeable to proceed.Consent signed and in chart. Creatinine elevated today at  1.39.     Electronically Signed: D. Rowe Robert 05/13/2016, 8:50 AM   I spent a total of 20 minutes at the the patient's bedside AND on the patient's hospital floor or unit, greater than 50% of which was counseling/coordinating care for port a cath placement

## 2016-05-14 ENCOUNTER — Telehealth (HOSPITAL_COMMUNITY): Payer: Self-pay | Admitting: Oncology

## 2016-05-14 ENCOUNTER — Encounter (HOSPITAL_COMMUNITY): Payer: BC Managed Care – PPO | Attending: Oncology

## 2016-05-14 ENCOUNTER — Telehealth: Payer: Self-pay | Admitting: Family Medicine

## 2016-05-14 ENCOUNTER — Other Ambulatory Visit (HOSPITAL_COMMUNITY): Payer: Self-pay | Admitting: Oncology

## 2016-05-14 DIAGNOSIS — R935 Abnormal findings on diagnostic imaging of other abdominal regions, including retroperitoneum: Secondary | ICD-10-CM | POA: Insufficient documentation

## 2016-05-14 DIAGNOSIS — C569 Malignant neoplasm of unspecified ovary: Secondary | ICD-10-CM

## 2016-05-14 MED ORDER — ONDANSETRON HCL 8 MG PO TABS: 8.0000 mg | ORAL_TABLET | Freq: Three times a day (TID) | ORAL | Status: DC | PRN

## 2016-05-14 NOTE — Telephone Encounter (Signed)
Patient woke around midnight last pm and BG was 85 - she ate yogurt and was 106 at 1:45am.  At 8:05am this am BG was 76.   Yesterdays's readings - 182 at 11:30am, 100 at 4:57am and 120 at 8:20pm.  Current Basal Rate:        MN to 2am = 2.00  2am to 8am = 2.25  8am to 5pm = 2.25  5pm to MN = 2.10  Change Basal Rate to:        MN to 2am = 1.85  2am to 8am = 2.20  8am to 5pm = 2.25  5pm to MN = 1.90

## 2016-05-14 NOTE — Telephone Encounter (Signed)
Let patient know.

## 2016-05-14 NOTE — Telephone Encounter (Signed)
Left pt a message that zofran was approved and she can pick it up 7/14

## 2016-05-15 ENCOUNTER — Encounter (HOSPITAL_BASED_OUTPATIENT_CLINIC_OR_DEPARTMENT_OTHER): Payer: BC Managed Care – PPO

## 2016-05-15 ENCOUNTER — Ambulatory Visit (HOSPITAL_COMMUNITY)
Admission: RE | Admit: 2016-05-15 | Discharge: 2016-05-15 | Disposition: A | Discharge status: Home / Self Care | Payer: BC Managed Care – PPO | Source: Ambulatory Visit | Attending: Hematology & Oncology | Admitting: Hematology & Oncology

## 2016-05-15 ENCOUNTER — Other Ambulatory Visit (HOSPITAL_COMMUNITY): Payer: Self-pay | Admitting: Hematology & Oncology

## 2016-05-15 VITALS — BP 131/57 | HR 85 | Temp 98.3°F | Resp 18 | Wt 247.2 lb

## 2016-05-15 DIAGNOSIS — C763 Malignant neoplasm of pelvis: Secondary | ICD-10-CM | POA: Diagnosis not present

## 2016-05-15 DIAGNOSIS — R188 Other ascites: Secondary | ICD-10-CM | POA: Diagnosis present

## 2016-05-15 DIAGNOSIS — C569 Malignant neoplasm of unspecified ovary: Secondary | ICD-10-CM

## 2016-05-15 DIAGNOSIS — C579 Malignant neoplasm of female genital organ, unspecified: Secondary | ICD-10-CM | POA: Diagnosis not present

## 2016-05-15 DIAGNOSIS — Z5111 Encounter for antineoplastic chemotherapy: Secondary | ICD-10-CM

## 2016-05-15 DIAGNOSIS — M5430 Sciatica, unspecified side: Secondary | ICD-10-CM

## 2016-05-15 DIAGNOSIS — B029 Zoster without complications: Secondary | ICD-10-CM

## 2016-05-15 MED ORDER — SODIUM CHLORIDE 0.9 % IV SOLN
Freq: Once | INTRAVENOUS | Status: AC
  Administered 2016-05-15: 10:00:00 via INTRAVENOUS

## 2016-05-15 MED ORDER — VALACYCLOVIR HCL 1 G PO TABS: 1000.0000 mg | ORAL_TABLET | Freq: Three times a day (TID) | ORAL | Status: AC

## 2016-05-15 MED ORDER — SODIUM CHLORIDE 0.9% FLUSH
10.0000 mL | INTRAVENOUS | Status: DC | PRN
  Administered 2016-05-15: 10 mL
  Filled 2016-05-15: qty 10

## 2016-05-15 MED ORDER — SODIUM CHLORIDE 0.9 % IV SOLN
20.0000 mg | Freq: Once | INTRAVENOUS | Status: AC
  Administered 2016-05-15: 20 mg via INTRAVENOUS
  Filled 2016-05-15: qty 2

## 2016-05-15 MED ORDER — FAMOTIDINE IN NACL 20-0.9 MG/50ML-% IV SOLN
20.0000 mg | Freq: Once | INTRAVENOUS | Status: AC
  Administered 2016-05-15: 20 mg via INTRAVENOUS
  Filled 2016-05-15: qty 50

## 2016-05-15 MED ORDER — HYDROCODONE-ACETAMINOPHEN 5-325 MG PO TABS: 1.0000 | ORAL_TABLET | ORAL | Status: DC | PRN

## 2016-05-15 MED ORDER — PALONOSETRON HCL INJECTION 0.25 MG/5ML
0.2500 mg | Freq: Once | INTRAVENOUS | Status: AC
  Administered 2016-05-15: 0.25 mg via INTRAVENOUS
  Filled 2016-05-15: qty 5

## 2016-05-15 MED ORDER — HEPARIN SOD (PORK) LOCK FLUSH 100 UNIT/ML IV SOLN
500.0000 [IU] | Freq: Once | INTRAVENOUS | Status: AC | PRN
Start: 2016-05-15 — End: 2016-05-15
  Administered 2016-05-15: 500 [IU]

## 2016-05-15 MED ORDER — SODIUM CHLORIDE 0.9 % IV SOLN
496.0000 mg | Freq: Once | INTRAVENOUS | Status: AC
  Administered 2016-05-15: 500 mg via INTRAVENOUS
  Filled 2016-05-15: qty 45

## 2016-05-15 MED ORDER — DIPHENHYDRAMINE HCL 50 MG/ML IJ SOLN
50.0000 mg | Freq: Once | INTRAMUSCULAR | Status: AC
  Administered 2016-05-15: 50 mg via INTRAVENOUS
  Filled 2016-05-15: qty 1

## 2016-05-15 MED ORDER — PACLITAXEL CHEMO INJECTION 300 MG/50ML
175.0000 mg/m2 | Freq: Once | INTRAVENOUS | Status: AC
  Administered 2016-05-15: 396 mg via INTRAVENOUS
  Filled 2016-05-15: qty 66

## 2016-05-15 MED ORDER — HEPARIN SOD (PORK) LOCK FLUSH 100 UNIT/ML IV SOLN
INTRAVENOUS | Status: AC
  Filled 2016-05-15: qty 5

## 2016-05-15 MED ORDER — VALACYCLOVIR HCL 500 MG PO TABS: 500.0000 mg | ORAL_TABLET | Freq: Two times a day (BID) | ORAL | Status: DC

## 2016-05-15 NOTE — Progress Notes (Signed)
Tolerated chemo well. Ambulatory on discharge with husband. Patient is to go for paracentesis now.

## 2016-05-15 NOTE — Progress Notes (Signed)
Paracentesis complete no signs of distress. 3400 ml yellow colored ascites removed.

## 2016-05-15 NOTE — Patient Instructions (Signed)
[facility] Discharge Instructions for Patients Receiving Chemotherapy   Beginning January 23rd 2017 lab work for the [facility] will be done in the  Main lab at [facility] on 1st floor. If you have a lab appointment with the [facility] please come in thru the  Main Entrance and check in at the main information desk   Today you received the following chemotherapy agents Taxol and Carboplatin Cycle 1.  To help prevent nausea and vomiting after your treatment, we encourage you to take your nausea medication as instructed.   If you develop nausea and vomiting, or diarrhea that is not controlled by your medication, call the clinic.  The clinic phone number is [phone]. Office hours are Monday-Friday 8:30am-5:00pm.  BELOW ARE SYMPTOMS THAT SHOULD BE REPORTED IMMEDIATELY:  *FEVER GREATER THAN 101.0 F  *CHILLS WITH OR WITHOUT FEVER  NAUSEA AND VOMITING THAT IS NOT CONTROLLED WITH YOUR NAUSEA MEDICATION  *UNUSUAL SHORTNESS OF BREATH  *UNUSUAL BRUISING OR BLEEDING  TENDERNESS IN MOUTH AND THROAT WITH OR WITHOUT PRESENCE OF ULCERS  *URINARY PROBLEMS  *BOWEL PROBLEMS  UNUSUAL RASH Items with * indicate a potential emergency and should be followed up as soon as possible. If you have an emergency after office hours please contact your primary care physician or go to the nearest emergency department.  Please call the clinic during office hours if you have any questions or concerns.   You may also contact the Patient Navigator at [phone] should you have any questions or need assistance in obtaining follow up care.      Resources For Cancer Patients and their Caregivers ? American Cancer Society: Can assist with transportation, wigs, general needs, runs Look Good Feel Better.        (650)588-9628 ? Cancer Care: Provides financial assistance, online support groups, medication/co-pay assistance.  1-800-813-HOPE (878)081-6246) ? [place] Assists [place] cancer patients and their families through emotional , educational and financial support.  949-013-5141 ? [place] DSS Where to apply for food stamps, Medicaid and utility assistance. 518-349-1821 ? [employer]: Transportation to medical appointments. (838) 346-1549 ? Social Security Administration: May apply for disability if have a Stage IV cancer. (541) 799-2057 (737)213-1236 ? [employer], Disability and Transit Services: Assists with nutrition, care and transit needs. [phone]

## 2016-05-15 NOTE — Procedures (Signed)
PreOperative Dx: Stage III ovarian/primary peritoneal/fallopian tube cancer, ascites Postoperative Dx: Stage III ovarian/primary peritoneal/fallopian tube cancer, ascites Procedure:   US guided paracentesis Radiologist:  Thornton Papas Anesthesia:  10 ml of1% lidocaine Specimen:  3.4 LL of amber ascitic fluid EBL:   < 1 ml Complications: None

## 2016-05-16 ENCOUNTER — Encounter (HOSPITAL_BASED_OUTPATIENT_CLINIC_OR_DEPARTMENT_OTHER): Payer: BC Managed Care – PPO | Admitting: Hematology & Oncology

## 2016-05-16 ENCOUNTER — Encounter (HOSPITAL_COMMUNITY): Payer: Self-pay | Admitting: Hematology & Oncology

## 2016-05-16 VITALS — BP 133/54 | HR 90 | Temp 97.9°F | Resp 18 | Wt 242.6 lb

## 2016-05-16 DIAGNOSIS — R188 Other ascites: Secondary | ICD-10-CM | POA: Diagnosis not present

## 2016-05-16 DIAGNOSIS — C786 Secondary malignant neoplasm of retroperitoneum and peritoneum: Secondary | ICD-10-CM | POA: Diagnosis not present

## 2016-05-16 DIAGNOSIS — C569 Malignant neoplasm of unspecified ovary: Secondary | ICD-10-CM | POA: Diagnosis not present

## 2016-05-16 DIAGNOSIS — B029 Zoster without complications: Secondary | ICD-10-CM | POA: Diagnosis not present

## 2016-05-16 DIAGNOSIS — R180 Malignant ascites: Secondary | ICD-10-CM

## 2016-05-16 DIAGNOSIS — R935 Abnormal findings on diagnostic imaging of other abdominal regions, including retroperitoneum: Secondary | ICD-10-CM | POA: Diagnosis present

## 2016-05-16 MED ORDER — OMEPRAZOLE 40 MG PO CPDR: 40.0000 mg | DELAYED_RELEASE_CAPSULE | Freq: Every day | ORAL | Status: DC

## 2016-05-16 MED ORDER — ONDANSETRON HCL 8 MG PO TABS: 8.0000 mg | ORAL_TABLET | Freq: Three times a day (TID) | ORAL | Status: DC | PRN

## 2016-05-16 NOTE — Progress Notes (Signed)
24h follow up done in the clinic today while pt seeing MD - pt doing great.

## 2016-05-16 NOTE — Patient Instructions (Signed)
[facility] Discharge Instructions  RECOMMENDATIONS MADE BY THE CONSULTANT AND ANY TEST RESULTS WILL BE SENT TO YOUR REFERRING PHYSICIAN.   Calling your Zofran in and a pill for acid reflux  We will set you up for genetic counseling  Return to see Dr. Whitney Muse next week with a lab draw to check your blood counts.  Call [phone] for any problems. There is an on call nurse available through [place] after hours and the weekend -- so call if you have any concerns/questions.  Thank you for choosing [facility] to provide your oncology and hematology care.  To afford each patient quality time with our provider, please arrive at least 15 minutes before your scheduled appointment time.   Beginning January 23rd 2017 lab work for the [facility] will be done in the  Main lab at [facility] on 1st floor. If you have a lab appointment with the [facility] please come in thru the  Main Entrance and check in at the main information desk  You need to re-schedule your appointment should you arrive 10 or more minutes late.  We strive to give you quality time with our providers, and arriving late affects you and other patients whose appointments are after yours.  Also, if you no show three or more times for appointments you may be dismissed from the clinic at the providers discretion.     Again, thank you for choosing [facility].  Our hope is that these requests will decrease the amount of time that you wait before being seen by our physicians.       _____________________________________________________________  Should you have questions after your visit to [facility], please contact our office at [phone] between the hours of 8:30 a.m. and 4:30 p.m.  Voicemails left after 4:30 p.m. will not be returned until the following business day.  For prescription refill requests, have your pharmacy  contact our office.         Resources For Cancer Patients and their Caregivers ? American Cancer Society: Can assist with transportation, wigs, general needs, runs Look Good Feel Better.        (907)322-2569 ? Cancer Care: Provides financial assistance, online support groups, medication/co-pay assistance.  1-800-813-HOPE (351) 378-8870) ? [place] Assists [place] cancer patients and their families through emotional , educational and financial support.  931-687-7103 ? [place] DSS Where to apply for food stamps, Medicaid and utility assistance. 703 510 8791 ? [employer]: Transportation to medical appointments. (636)683-8713 ? Social Security Administration: May apply for disability if have a Stage IV cancer. 904-549-0904 256-262-2802 ? [employer], Disability and Transit Services: Assists with nutrition, care and transit needs. [facility] Support Programs: @10RELATIVEDAYS @ > Cancer Support Group  2nd Tuesday of the month 1pm-2pm, Journey Room  > Creative Journey  3rd Tuesday of the month 1130am-1pm, Journey Room  > Look Good Feel Better  1st Wednesday of the month 10am-12 noon, Journey Room (Call [place] to register [phone])

## 2016-05-16 NOTE — Progress Notes (Signed)
[facility] Hematology/Oncology Progress Note   Name: Ellen Hunt      MRN: [id number]    Date: 05/16/2016 Time:9:59 AM   REFERRING PHYSICIAN:  Milton Ferguson, MD (ED physcian)   DIAGNOSIS:  Ovarian Carcinoma    Ovarian cancer ([place])   04/29/2016 Imaging    CT abd/pelvis- Extensive omental caking as well as moderate amount of ascites within the abdomen most compatible with peritoneal metastatic disease, of unknown primary. This may potentially be ovarian or a GI in etiology.     04/30/2016 Tumor Marker    CA 125- 7149.0 (H)     05/01/2016 Procedure    US paracentesis- Successful ultrasound-guided paracentesis yielding 1.8 liters of peritoneal fluid.     05/01/2016 Imaging    US pelvis- Both transabdominal and transvaginal sonography are significantly limited by large patient habitus and ascites. Neither uterus or ovaries were visualized on this exam.     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     05/08/2016 Imaging    CT chest- No evidence of metastatic disease in the chest. Peritoneal/omental disease with abdominal ascites in the upper abdomen, incompletely visualized.      05/13/2016 Procedure    Placement of single lumen port a cath via right internal jugular vein. The catheter tip lies at the cavoatrial junction. A power injectable port a cath was placed and is ready for immediate use.     05/15/2016 Procedure    US Paracentesis- 3400 ml yellow colored ascites removed     05/15/2016 -  Chemotherapy    Carboplatin/Paclitaxel every 21 days       HISTORY OF PRESENT ILLNESS:   Ellen Hunt is a 65 y.o. female with a diagnosis of ovarian carcinoma. She presents today for ongoing follow-up. She has started chemotherapy with Carboplatin/Taxol.  Ellen Hunt returns to the [place] today accompanied by her husband. She confirms that,  yesterday, the paracentesis helped her comfort significantly.   She notes that insurance was "blocking" her Zofran, and isn't sure if the prescription's been called in or available to be renewed. She says "that one works better than the red pill." She has had some constipation and notes that she  understands that some people need both a softener and a stimulant.   She notes that her son-in-law's family has some cancer researchers in it, and they'd like to discuss her condition.  She says that, yesterday, other than sitting, she was fine other than a little reflux. She doesn't think she's on protonix but isn't sure.  She confirms that she ate last night; tomato soup and a PBJ sandwich.  During the physical exam, her forehead was examined and is healing well. No new lesions. All prior lesions are dried or healing.    Review of Systems  Constitutional: Negative for fever, chills, weight loss and malaise/fatigue.  HENT: Negative.   Eyes: Negative.   Respiratory: Positive for shortness of breath. Negative for cough, sputum production and wheezing.   Cardiovascular: Positive for leg swelling (B/L).  Gastrointestinal: Positive for abdominal pain and diarrhea (chronic). Negative for nausea and vomiting.  Genitourinary: Negative.   Musculoskeletal: Negative.   Skin: Negative.   Neurological: Negative.   Endo/Heme/Allergies: Negative.   Psychiatric/Behavioral: Negative.    14 point review of systems was performed and is negative except as detailed under history of present illness and above  PAST MEDICAL HISTORY:   Past Medical History  Diagnosis Date  . Hypertension   . Diabetes mellitus without complication ([place])   . Hyperlipidemia   . Low serum vitamin D   . Abnormal CT of the abdomen 04/30/2016    ALLERGIES: Allergies  Allergen Reactions  . Avandia [Rosiglitazone] Other (See Comments)    Legs swelled  . Micronase [Glyburide] Swelling  . Actos [Pioglitazone] Other (See  Comments)    Edema / leg swelling      MEDICATIONS: I have reviewed the patient's current medications.    Current Outpatient Prescriptions on File Prior to Visit  Medication Sig Dispense Refill  . aspirin 81 MG tablet Take 1 tablet (81 mg total) by mouth daily. (Patient taking differently: Take 81 mg by mouth every evening. ) 30 tablet   . atorvastatin (LIPITOR) 40 MG tablet TAKE 1 TABLET(40 MG) BY MOUTH DAILY (Patient taking differently: Take 40 mg by mouth every evening. ) 90 tablet 0  . benazepril (LOTENSIN) 10 MG tablet Take 1 tablet (10 mg total) by mouth daily. (Patient taking differently: Take 10 mg by mouth every evening. ) 90 tablet 0  . CARBOPLATIN IV Inject into the vein. Every 21 days    . dexamethasone (DECADRON) 4 MG tablet The day before chemo take 5 tabs (34m total) in the am and 5 tabs (265mtotal) in the pm. The morning of chemo take 5 tabs (2066motal). 45 tablet 1  . furosemide (LASIX) 20 MG tablet Take 1 tablet (20 mg total) by mouth daily. 30 tablet 5  . glucagon (GLUCAGON EMERGENCY) 1 MG injection Inject as directed as needed for low blood glucose if unresponsive. 1 each 0  . glucosamine-chondroitin 500-400 MG tablet Take 1 tablet by mouth 2 (two) times daily.    . gMarland Kitchenucose blood (ONE TOUCH ULTRA TEST) test strip USE TO CHECK BLOOD SUGAR UP TO 5 TIMES A DAY 450 each 2  . HYDROcodone-acetaminophen (NORCO) 5-325 MG tablet Take 1-2 tablets by mouth every 4 (four) hours as needed for moderate pain. 120 tablet 0  . insulin aspart (NOVOLOG) 100 UNIT/ML injection USE IN INSULIN PUMP AS DIRECTED (Max daily dose is 110 units per day) 40 mL 3  . lidocaine-prilocaine (EMLA) cream Apply a quarter size amount to port site 1 hour prior to chemo. Do not rub in. Cover with plastic wrap. 30 g 3  . metFORMIN (GLUCOPHAGE) 1000 MG tablet TAKE 1 TABLET BY MOUTH TWICE DAILY WITH A MEAL (Patient taking differently: Take 1,000 mg by mouth 2 (two) times daily with a meal. ) 180 tablet 1  .  Multiple Vitamin (MULTIVITAMIN WITH MINERALS) TABS Take 1 tablet by mouth daily.    . ondansetron (ZOFRAN) 8 MG tablet Take 1 tablet (8 mg total) by mouth every 8 (eight) hours as needed for nausea or vomiting. 30 tablet 2  . PACLitaxel (TAXOL IV) Inject into the vein. Every 21 days    . prochlorperazine (COMPAZINE) 10 MG tablet Take 1 tablet (10 mg total) by mouth every 6 (six) hours as needed for nausea or vomiting. 30 tablet 2  . valACYclovir (VALTREX) 1000 MG tablet Take 1 tablet (1,000 mg total) by mouth 3 (three) times daily. When complete continue with the 500 mg tablet twice daily for suppression 21 tablet 0  . valACYclovir (VALTREX) 500 MG tablet Take 1 tablet (500 mg total) by mouth 2 (two) times daily. Start after completion of 1000 mg po tid 60 tablet 3   No  current facility-administered medications on file prior to visit.     PAST SURGICAL HISTORY Past Surgical History  Procedure Laterality Date  . Cesarean section    . Lumbar fusion  08/21/15    L3-L4 Dr. Timmothy Euler    FAMILY HISTORY: Family History  Problem Relation Age of Onset  . Cancer Mother   . Cancer Father   . Diabetes Paternal Grandmother   . Diabetes Paternal Grandfather     SOCIAL HISTORY:  reports that she has never smoked. She has never used smokeless tobacco. She reports that she does not drink alcohol or use illicit drugs.  Social History   Social History  . Marital Status: Married    Spouse Name: N/A  . Number of Children: N/A  . Years of Education: N/A   Social History Main Topics  . Smoking status: Never Smoker   . Smokeless tobacco: Never Used  . Alcohol Use: No  . Drug Use: No  . Sexual Activity: Not Asked   Other Topics Concern  . None   Social History Narrative    PERFORMANCE STATUS: The patient's performance status is 1 - Symptomatic but completely ambulatory  PHYSICAL EXAM: Most Recent Vital Signs: Blood pressure 133/54, pulse 90, temperature 97.9 F (36.6 C), temperature  source Oral, resp. rate 18, weight 242 lb 9.6 oz (110.043 kg), SpO2 98 %. General appearance: alert, cooperative, appears stated age, no distress, moderately obese and accompanied by her husband. Head: Normocephalic, without obvious abnormality, atraumatic Throat: lips, mucosa, and tongue normal; teeth and gums normal Neck: no adenopathy and supple, symmetrical, trachea midline Lungs: clear to auscultation bilaterally and normal percussion bilaterally Heart: regular rate and rhythm, S1, S2 normal, no murmur, click, rub or gallop Abdomen: abnormal findings:  ascites, distended, obese and woody infiltration of lower abdomen Extremities: edema B/L LE edema, 2+ pitting Skin: Skin color, texture, turgor normal. No rashes or lesions Lymph nodes: Cervical, supraclavicular, and axillary nodes normal. Neurologic: Grossly normal  LABORATORY DATA:  No results found for this or any previous visit (from the past 48 hour(s)).   Results for Ellen Hunt, Ellen Hunt (MRN [id number]) as of 06/15/2016 09:45  Ref. Range 05/13/2016 07:36  Sodium Latest Ref Range: 135 - 145 mmol/L 133 (L)  Potassium Latest Ref Range: 3.5 - 5.1 mmol/L 4.9  Chloride Latest Ref Range: 101 - 111 mmol/L 106  CO2 Latest Ref Range: 22 - 32 mmol/L 17 (L)  BUN Latest Ref Range: 6 - 20 mg/dL 25 (H)  Creatinine Latest Ref Range: 0.44 - 1.00 mg/dL 1.39 (H)  Calcium Latest Ref Range: 8.9 - 10.3 mg/dL 8.7 (L)  EGFR (Non-African Amer.) Latest Ref Range: >60 mL/min 39 (L)  EGFR (African American) Latest Ref Range: >60 mL/min 46 (L)  Glucose Latest Ref Range: 65 - 99 mg/dL 150 (H)  Anion gap Latest Ref Range: 5 - 15  10  WBC Latest Ref Range: 4.0 - 10.5 K/uL 9.2  RBC Latest Ref Range: 3.87 - 5.11 MIL/uL 3.88  Hemoglobin Latest Ref Range: 12.0 - 15.0 g/dL 11.4 (L)  HCT Latest Ref Range: 36.0 - 46.0 % 34.6 (L)  MCV Latest Ref Range: 78.0 - 100.0 fL 89.2  MCH Latest Ref Range: 26.0 - 34.0 pg 29.4  MCHC Latest Ref Range: 30.0 - 36.0 g/dL 32.9  RDW  Latest Ref Range: 11.5 - 15.5 % 14.1  Platelets Latest Ref Range: 150 - 400 K/uL 396   Results for Ellen Hunt, Ellen Hunt (MRN [id number])   Ref. Range 04/30/2016 16:30 05/16/2016  10:01  CA 125 Latest Ref Range: 0.0 - 38.1 U/mL 7,149.0 (H) 11,113.0 (H)    RADIOGRAPHY: US Paracentesis  05/15/2016  INDICATION: Stage III ovarian/primary peritoneal/fallopian tube cancer, ascites EXAM: ULTRASOUND GUIDED  PARACENTESIS MEDICATIONS: None. COMPLICATIONS: None immediate. PROCEDURE: Procedure, benefits, and risks of procedure were discussed with patient. Written informed consent for procedure was obtained. Time out protocol followed. Adequate collection of ascites localized by ultrasound in RIGHT lower quadrant. Skin prepped and draped in usual sterile fashion. Skin and soft tissues anesthetized with 10 mL of 1% lidocaine. 5 [place] Yueh catheter placed into peritoneal cavity. 3.4 L of amber colored fluid aspirated by vacuum bottle suction. Procedure tolerated well by patient without immediate complication. FINDINGS: A total of approximately 3.4 L of peritoneal fluid was removed. IMPRESSION: Successful ultrasound-guided paracentesis yielding 3.4 L liters of peritoneal fluid. Electronically Signed   By: Lavonia Dana M.D.   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 I have then placed her on suppressive therapy. We discussed this in detail.   I reviewed constipation and the need to prevent it. We reviewed laxatives -- stimulants and softeners. She was given a constipation education sheet.   I have referred her to [facility] for genetics.   We again reviewed potential side effects of chemotherapy moving forward.   If she is not on a PPI we will call one in. I will also check on her zofran.   She will return next week for a nadir  check. They know to contact us with any problems or concerns in the interim.   All questions were answered. The patient knows to call the clinic with any problems, questions or concerns. We can certainly see the patient much sooner if necessary.  This document serves as a record of services personally performed by Ancil Linsey, MD. It was created on her behalf by Toni Amend, a trained medical scribe. The creation of this record is based on the scribe's personal observations and the provider's statements to them. This document has been checked and approved by the attending provider.  I have reviewed the above documentation for accuracy and completeness and I agree with the above.  This note is electronically signed Molli Hazard, MD y7/14/2017 9:59 AM

## 2016-05-17 ENCOUNTER — Emergency Department (HOSPITAL_COMMUNITY)
Admission: EM | Admit: 2016-05-17 | Discharge: 2016-05-17 | Disposition: A | Discharge status: Home / Self Care | Payer: BC Managed Care – PPO | Attending: Emergency Medicine | Admitting: Emergency Medicine

## 2016-05-17 ENCOUNTER — Encounter (HOSPITAL_COMMUNITY): Payer: Self-pay

## 2016-05-17 DIAGNOSIS — I10 Essential (primary) hypertension: Secondary | ICD-10-CM | POA: Insufficient documentation

## 2016-05-17 DIAGNOSIS — N390 Urinary tract infection, site not specified: Secondary | ICD-10-CM | POA: Insufficient documentation

## 2016-05-17 DIAGNOSIS — Z79899 Other long term (current) drug therapy: Secondary | ICD-10-CM | POA: Diagnosis not present

## 2016-05-17 DIAGNOSIS — E119 Type 2 diabetes mellitus without complications: Secondary | ICD-10-CM | POA: Insufficient documentation

## 2016-05-17 DIAGNOSIS — R197 Diarrhea, unspecified: Secondary | ICD-10-CM | POA: Diagnosis not present

## 2016-05-17 DIAGNOSIS — Z7984 Long term (current) use of oral hypoglycemic drugs: Secondary | ICD-10-CM | POA: Insufficient documentation

## 2016-05-17 DIAGNOSIS — Z7982 Long term (current) use of aspirin: Secondary | ICD-10-CM | POA: Diagnosis not present

## 2016-05-17 DIAGNOSIS — E785 Hyperlipidemia, unspecified: Secondary | ICD-10-CM | POA: Insufficient documentation

## 2016-05-17 DIAGNOSIS — Z794 Long term (current) use of insulin: Secondary | ICD-10-CM | POA: Insufficient documentation

## 2016-05-17 DIAGNOSIS — R1084 Generalized abdominal pain: Secondary | ICD-10-CM | POA: Diagnosis present

## 2016-05-17 LAB — CA 125: CA 125: 11113 U/mL — AB (ref 0.0–38.1)

## 2016-05-17 LAB — URINALYSIS, ROUTINE W REFLEX MICROSCOPIC
BILIRUBIN URINE: NEGATIVE
GLUCOSE, UA: NEGATIVE mg/dL
Ketones, ur: NEGATIVE mg/dL
Nitrite: NEGATIVE
SPECIFIC GRAVITY, URINE: 1.02 (ref 1.005–1.030)
pH: 6 (ref 5.0–8.0)

## 2016-05-17 LAB — URINE MICROSCOPIC-ADD ON

## 2016-05-17 LAB — I-STAT CHEM 8, ED
BUN: 26 mg/dL — ABNORMAL HIGH (ref 6–20)
CALCIUM ION: 1.11 mmol/L — AB (ref 1.12–1.23)
Chloride: 108 mmol/L (ref 101–111)
Creatinine, Ser: 0.9 mg/dL (ref 0.44–1.00)
GLUCOSE: 133 mg/dL — AB (ref 65–99)
HCT: 36 % (ref 36.0–46.0)
HEMOGLOBIN: 12.2 g/dL (ref 12.0–15.0)
Potassium: 4.6 mmol/L (ref 3.5–5.1)
Sodium: 139 mmol/L (ref 135–145)
TCO2: 20 mmol/L (ref 0–100)

## 2016-05-17 MED ORDER — DIAZEPAM 5 MG/ML IJ SOLN: 2.5000 mg | Freq: Once | INTRAMUSCULAR | Status: DC

## 2016-05-17 MED ORDER — CEPHALEXIN 500 MG PO CAPS
1000.0000 mg | ORAL_CAPSULE | Freq: Once | ORAL | Status: AC
  Administered 2016-05-17: 1000 mg via ORAL
  Filled 2016-05-17: qty 2

## 2016-05-17 MED ORDER — DIAZEPAM 5 MG PO TABS: 2.5000 mg | ORAL_TABLET | Freq: Three times a day (TID) | ORAL | Status: DC | PRN

## 2016-05-17 MED ORDER — DIAZEPAM 5 MG/ML IJ SOLN
2.5000 mg | Freq: Once | INTRAMUSCULAR | Status: AC
  Administered 2016-05-17: 2.5 mg via INTRAMUSCULAR
  Filled 2016-05-17: qty 2

## 2016-05-17 MED ORDER — CEPHALEXIN 500 MG PO CAPS: 1000.0000 mg | ORAL_CAPSULE | Freq: Two times a day (BID) | ORAL | Status: DC

## 2016-05-17 NOTE — ED Provider Notes (Signed)
CSN: [id number]     Arrival date & time 05/17/16  0540 History   First MD Initiated Contact with Patient 05/17/16 0617     Chief Complaint  Patient presents with  . Abdominal Pain     (Consider location/radiation/quality/duration/timing/severity/associated sxs/prior Treatment) Patient is a 65 y.o. female presenting with abdominal pain.  Abdominal Pain Pain location:  Generalized Pain quality: aching and fullness   Pain radiates to:  Does not radiate Pain severity:  Moderate Onset quality:  Gradual Duration:  1 day Timing:  Constant Progression:  Worsening Chronicity:  New Context: not alcohol use and not eating   Relieved by:  None tried Worsened by:  Nothing tried Ineffective treatments:  None tried Associated symptoms: diarrhea, dysuria, nausea and vomiting   Associated symptoms: no anorexia, no constipation, no fever, no vaginal bleeding and no vaginal discharge     Past Medical History  Diagnosis Date  . Hypertension   . Diabetes mellitus without complication ([place])   . Hyperlipidemia   . Low serum vitamin D   . Abnormal CT of the abdomen 04/30/2016   Past Surgical History  Procedure Laterality Date  . Cesarean section    . Lumbar fusion  08/21/15    L3-L4 Dr. Timmothy Euler   Family History  Problem Relation Age of Onset  . Cancer Mother   . Cancer Father   . Diabetes Paternal Grandmother   . Diabetes Paternal Grandfather    Social History  Substance Use Topics  . Smoking status: Never Smoker   . Smokeless tobacco: Never Used  . Alcohol Use: No   OB History    No data available     Review of Systems  Constitutional: Negative for fever.  Gastrointestinal: Positive for nausea, vomiting, abdominal pain and diarrhea. Negative for constipation and anorexia.  Genitourinary: Positive for dysuria. Negative for vaginal bleeding and vaginal discharge.  All other systems reviewed and are negative.     Allergies  Avandia; Micronase; and Actos  Home  Medications   Prior to Admission medications   Medication Sig Start Date End Date Taking? Authorizing Provider  aspirin 81 MG tablet Take 1 tablet (81 mg total) by mouth daily. Patient taking differently: Take 81 mg by mouth every evening.  04/21/14   Tammy Eckard, PHARMD  atorvastatin (LIPITOR) 40 MG tablet TAKE 1 TABLET(40 MG) BY MOUTH DAILY Patient taking differently: Take 40 mg by mouth every evening.  03/10/16   Tammy Eckard, PHARMD  benazepril (LOTENSIN) 10 MG tablet Take 1 tablet (10 mg total) by mouth daily. Patient taking differently: Take 10 mg by mouth every evening.  03/10/16   Tammy Eckard, PHARMD  CARBOPLATIN IV Inject into the vein. Every 21 days    Historical Provider, MD  dexamethasone (DECADRON) 4 MG tablet The day before chemo take 5 tabs (20mg  total) in the am and 5 tabs (20mg  total) in the pm. The morning of chemo take 5 tabs (20mg  total). 05/10/16   Patrici Ranks, MD  furosemide (LASIX) 20 MG tablet Take 1 tablet (20 mg total) by mouth daily. 10/03/15   [name], MD  glucagon (GLUCAGON EMERGENCY) 1 MG injection Inject as directed as needed for low blood glucose if unresponsive. 05/12/16   Tammy Eckard, PHARMD  glucosamine-chondroitin 500-400 MG tablet Take 1 tablet by mouth 2 (two) times daily.    Historical Provider, MD  glucose blood (ONE TOUCH ULTRA TEST) test strip USE TO CHECK BLOOD SUGAR UP TO 5 TIMES A DAY 01/11/16  [name], MD  HYDROcodone-acetaminophen [facility]) 5-325 MG tablet Take 1-2 tablets by mouth every 4 (four) hours as needed for moderate pain. 05/15/16   Patrici Ranks, MD  insulin aspart (NOVOLOG) 100 UNIT/ML injection USE IN INSULIN PUMP AS DIRECTED (Max daily dose is 110 units per day) 05/01/16   [name], MD  lidocaine-prilocaine (EMLA) cream Apply a quarter size amount to port site 1 hour prior to chemo. Do not rub in. Cover with plastic wrap. 05/10/16   Patrici Ranks, MD  metFORMIN (GLUCOPHAGE) 1000 MG tablet TAKE 1 TABLET BY  MOUTH TWICE DAILY WITH A MEAL Patient taking differently: Take 1,000 mg by mouth 2 (two) times daily with a meal.  01/11/16   [name], MD  Multiple Vitamin (MULTIVITAMIN WITH MINERALS) TABS Take 1 tablet by mouth daily.    Historical Provider, MD  omeprazole (PRILOSEC) 40 MG capsule Take 1 capsule (40 mg total) by mouth daily. 05/16/16   Patrici Ranks, MD  ondansetron (ZOFRAN) 8 MG tablet Take 1 tablet (8 mg total) by mouth every 8 (eight) hours as needed for nausea or vomiting. 05/16/16   Patrici Ranks, MD  PACLitaxel (TAXOL IV) Inject into the vein. Every 21 days    Historical Provider, MD  prochlorperazine (COMPAZINE) 10 MG tablet Take 1 tablet (10 mg total) by mouth every 6 (six) hours as needed for nausea or vomiting. 04/30/16   Patrici Ranks, MD  valACYclovir (VALTREX) 1000 MG tablet Take 1 tablet (1,000 mg total) by mouth 3 (three) times daily. When complete continue with the 500 mg tablet twice daily for suppression 05/15/16 05/22/16  Patrici Ranks, MD  valACYclovir (VALTREX) 500 MG tablet Take 1 tablet (500 mg total) by mouth 2 (two) times daily. Start after completion of 1000 mg po tid 05/15/16   Patrici Ranks, MD   BP 136/64 mmHg  Pulse 82  Resp 20  SpO2 98% Physical Exam  Constitutional: She appears well-developed and well-nourished.  HENT:  Head: Normocephalic and atraumatic.  Neck: Normal range of motion.  Cardiovascular: Normal rate and regular rhythm.   Pulmonary/Chest: No stridor. No respiratory distress.  Abdominal: Soft. Bowel sounds are normal. She exhibits no distension. There is no tenderness. There is no rebound and no guarding.  Neurological: She is alert.  Nursing note and vitals reviewed.   ED Course  Procedures (including critical care time) Labs Review Labs Reviewed  URINALYSIS, ROUTINE W REFLEX MICROSCOPIC (NOT AT [facility]) - Abnormal; Notable for the following:    Hgb urine dipstick TRACE (*)    Protein, ur TRACE (*)    Leukocytes, UA  MODERATE (*)    All other components within normal limits  URINE MICROSCOPIC-ADD ON - Abnormal; Notable for the following:    Squamous Epithelial / LPF 6-30 (*)    Bacteria, UA MANY (*)    All other components within normal limits  I-STAT CHEM 8, ED - Abnormal; Notable for the following:    BUN 26 (*)    Glucose, Bld 133 (*)    Calcium, Ion 1.11 (*)    All other components within normal limits  URINE CULTURE    Imaging Review US Paracentesis  05/15/2016  INDICATION: Stage III ovarian/primary peritoneal/fallopian tube cancer, ascites EXAM: ULTRASOUND GUIDED  PARACENTESIS MEDICATIONS: None. COMPLICATIONS: None immediate. PROCEDURE: Procedure, benefits, and risks of procedure were discussed with patient. Written informed consent for procedure was obtained. Time out protocol followed. Adequate collection of ascites localized by  ultrasound in RIGHT lower quadrant. Skin prepped and draped in usual sterile fashion. Skin and soft tissues anesthetized with 10 mL of 1% lidocaine. 5 [place] Yueh catheter placed into peritoneal cavity. 3.4 L of amber colored fluid aspirated by vacuum bottle suction. Procedure tolerated well by patient without immediate complication. FINDINGS: A total of approximately 3.4 L of peritoneal fluid was removed. IMPRESSION: Successful ultrasound-guided paracentesis yielding 3.4 L liters of peritoneal fluid. Electronically Signed   By: Lavonia Dana M.D.   On: 05/15/2016 16:53   I have personally reviewed and evaluated these images and lab results as part of my medical decision-making.   EKG Interpretation None      MDM   Final diagnoses:  UTI (lower urinary tract infection)    Abdominal pain of uncertain etiology at this time. Possibly related to UTI v cancer v gastroentertiis. Symptoms improved considerably while in ED with tour coming back. Appeared well. Multiple abdominal exams without peritonitis. Doubt SBP from recent paracentesis. Doubt obstruction, appendicitis  either. Will continue following with PCP/oncologist, otherwise return here for new/worsening symptoms.   New Prescriptions: Discharge Medication List as of 05/17/2016  7:21 AM    START taking these medications   Details  cephALEXin (KEFLEX) 500 MG capsule Take 2 capsules (1,000 mg total) by mouth 2 (two) times daily., Starting 05/17/2016, Until Discontinued, Print    diazepam (VALIUM) 5 MG tablet Take 0.5 tablets (2.5 mg total) by mouth every 8 (eight) hours as needed (spasms)., Starting 05/17/2016, Until Discontinued, Print         I have personally and contemperaneously reviewed labs and imaging and used in my decision making as above.   A medical screening exam was performed and I feel the patient has had an appropriate workup for their chief complaint at this time and likelihood of emergent condition existing is low and thus workup can continue on an outpatient basis.. Their vital signs are stable. They have been counseled on decision, discharge, follow up and which symptoms necessitate immediate return to the emergency department.  They verbally stated understanding and agreement with plan and discharged in stable condition.      Merrily Pew, MD 05/21/16 [phone]

## 2016-05-17 NOTE — ED Notes (Signed)
Pt started chemo on Thursday and had paracentesis the same day, states she started having pain to her left lower abd and flank with some diarrhea.

## 2016-05-20 ENCOUNTER — Telehealth (HOSPITAL_COMMUNITY): Payer: Self-pay | Admitting: *Deleted

## 2016-05-20 LAB — URINE CULTURE

## 2016-05-20 NOTE — Telephone Encounter (Signed)
24h follow up: This was not documented @ 24 hours however this patient was in personal contact with me on Saturday (day after chemo), Sunday, and Monday. Patient's weekend issues addressed over the weekend and patient doing ok.

## 2016-05-22 ENCOUNTER — Encounter (HOSPITAL_COMMUNITY): Payer: Self-pay | Admitting: Oncology

## 2016-05-22 ENCOUNTER — Encounter (HOSPITAL_BASED_OUTPATIENT_CLINIC_OR_DEPARTMENT_OTHER): Payer: BC Managed Care – PPO | Admitting: Oncology

## 2016-05-22 ENCOUNTER — Encounter (HOSPITAL_COMMUNITY): Payer: BC Managed Care – PPO

## 2016-05-22 VITALS — BP 136/56 | HR 101 | Temp 98.0°F | Resp 18 | Wt 234.0 lb

## 2016-05-22 DIAGNOSIS — C569 Malignant neoplasm of unspecified ovary: Secondary | ICD-10-CM | POA: Diagnosis not present

## 2016-05-22 DIAGNOSIS — R935 Abnormal findings on diagnostic imaging of other abdominal regions, including retroperitoneum: Secondary | ICD-10-CM | POA: Diagnosis not present

## 2016-05-22 LAB — CBC WITH DIFFERENTIAL/PLATELET
BASOS PCT: 0 %
Basophils Absolute: 0 10*3/uL (ref 0.0–0.1)
EOS ABS: 0.2 10*3/uL (ref 0.0–0.7)
Eosinophils Relative: 6 %
HCT: 32.5 % — ABNORMAL LOW (ref 36.0–46.0)
HEMOGLOBIN: 10.8 g/dL — AB (ref 12.0–15.0)
Lymphocytes Relative: 7 %
Lymphs Abs: 0.2 10*3/uL — ABNORMAL LOW (ref 0.7–4.0)
MCH: 29 pg (ref 26.0–34.0)
MCHC: 33.2 g/dL (ref 30.0–36.0)
MCV: 87.1 fL (ref 78.0–100.0)
Monocytes Absolute: 0.3 10*3/uL (ref 0.1–1.0)
Monocytes Relative: 9 %
NEUTROS PCT: 78 %
Neutro Abs: 2.5 10*3/uL (ref 1.7–7.7)
Platelets: 305 10*3/uL (ref 150–400)
RBC: 3.73 MIL/uL — AB (ref 3.87–5.11)
RDW: 14.2 % (ref 11.5–15.5)
WBC: 3.3 10*3/uL — AB (ref 4.0–10.5)

## 2016-05-22 MED ORDER — DIAZEPAM 5 MG PO TABS: 2.5000 mg | ORAL_TABLET | ORAL | Status: DC | PRN

## 2016-05-22 MED ORDER — DIAZEPAM 5 MG PO TABS: 5.0000 mg | ORAL_TABLET | ORAL | Status: DC | PRN

## 2016-05-22 NOTE — Patient Instructions (Signed)
[place] at [facility] Discharge Instructions  RECOMMENDATIONS MADE BY THE CONSULTANT AND ANY TEST RESULTS WILL BE SENT TO YOUR REFERRING PHYSICIAN.  Exam done and seen today by [name] Valuim refilled Labs today done Continue Diarrhea protocol Return to see the Doctor as scheduled Call the clinic for any concerns or questions.  Thank you for choosing [place] at [facility] to provide your oncology and hematology care.  To afford each patient quality time with our provider, please arrive at least 15 minutes before your scheduled appointment time.   Beginning January 23rd 2017 lab work for the [facility] will be done in the  Main lab at [facility] on 1st floor. If you have a lab appointment with the [facility] please come in thru the  Main Entrance and check in at the main information desk  You need to re-schedule your appointment should you arrive 10 or more minutes late.  We strive to give you quality time with our providers, and arriving late affects you and other patients whose appointments are after yours.  Also, if you no show three or more times for appointments you may be dismissed from the clinic at the providers discretion.     Again, thank you for choosing [facility].  Our hope is that these requests will decrease the amount of time that you wait before being seen by our physicians.       _____________________________________________________________  Should you have questions after your visit to [facility], please contact our office at [phone] between the hours of 8:30 a.m. and 4:30 p.m.  Voicemails left after 4:30 p.m. will not be returned until the following business day.  For prescription refill requests, have your pharmacy contact our office.         Resources For Cancer Patients and their Caregivers ? American Cancer Society: Can assist with transportation, wigs, general  needs, runs Look Good Feel Better.        (201)678-0390 ? Cancer Care: Provides financial assistance, online support groups, medication/co-pay assistance.  1-800-813-HOPE 438-843-0821) ? [facility] Assists [place] cancer patients and their families through emotional , educational and financial support.  541 275 1847 ? [place] DSS Where to apply for food stamps, Medicaid and utility assistance. 802-295-5430 ? [facility]: Transportation to medical appointments. 772-363-4234 ? Social Security Administration: May apply for disability if have a Stage IV cancer. 501-706-8751 781-576-2371 ? [employer], Disability and Transit Services: Assists with nutrition, care and transit needs. [place] Support Programs: @10RELATIVEDAYS @ > Cancer Support Group  2nd Tuesday of the month 1pm-2pm, Journey Room  > Creative Journey  3rd Tuesday of the month 1130am-1pm, Journey Room  > Look Good Feel Better  1st Wednesday of the month 10am-12 noon, Journey Room (Call [place] to register [phone])

## 2016-05-22 NOTE — Assessment & Plan Note (Addendum)
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  Started on systemic chemotherapy on 05/15/2016 consisting of Carboplatin/Paclitaxel after evaluation by Gyn Onc.  Oncology history updated.  ED visit from 05/17/2016 noted.  Labs today for NADIR check: CBC diff, CMET.  I personally reviewed and went over laboratory results with the patient.  The results are noted within this dictation.  She has been seen by Dr. Denman George on 05/09/2016: In her case, her extreme obesity and poorly controlled diabetes mellitus places her at increased perioperative risk and carries with it substantial (50%) risk for wound infection and dehiscence which can delay chemotherapy. For this reason the patient is electing for the approach of neoadjuvant chemotherapy for 3 cycles of q 21 day carboplatin AUC 6 and paclitaxel 175mg /m2. I recommend CT scan 1 week after day 1 of cycle 3 and I will see her within this cycle to review her CT images and plan for interval debulking surgery.  We reviewed side effects of Carboplatin/paclitaxel chemotherapy. Unfortunately, she experienced severe bone/muscle pain secondary to paclitaxel.  This occurred 24-48 hours post treatment.  She visited the ED as a result of this discomfort.  She was given Hydrocodone and Valium with significant improvement in control of her discomfort.  I will refill Valium Rx.  She is now having some issues with loose stool.  She notes that they are significantly improved with Imodium treatment.  She is encouraged to continue with good hydration.  Abdomen is improved.  She has an appointment with Genetic Counseling on 06/12/2016.  She will return in ~ 2 weeks as scheduled for follow-up and to embark on cycle 2 of treatment.  At that time, we will get her set-up for CT imaging following cycle #3 to evaluate  response to therapy and keep Dr. Denman George apprised of the results.

## 2016-05-22 NOTE — Progress Notes (Signed)
[place] [id number]  Ovarian cancer, unspecified laterality [facility]) - Plan: CBC with Differential, Comprehensive metabolic panel, diazepam (VALIUM) 5 MG tablet, DISCONTINUED: diazepam (VALIUM) 5 MG tablet  CURRENT THERAPY: Carboplatin/Paclitaxel beginning on 05/15/2016.  INTERVAL HISTORY: Ellen Hunt 65 y.o. 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  Started on systemic chemotherapy on 05/15/2016 consisting of Carboplatin/Paclitaxel after evaluation by Gyn Onc.  She denies any nausea or vomiting from treatment.  She, unfortunately, experienced Paclitaxel-induced bone/muscle pain in her hips, and thighs.  She notes that it improved within 72 hours of onset with symptom management medications.  She also is experiencing issues with diarrhea that is resolved with Imodium anti-diarrhea management.  Review of Systems  Constitutional: Negative for fever, chills and weight loss.  HENT: Negative.   Eyes: Negative.   Respiratory: Negative.   Cardiovascular: Negative.   Gastrointestinal: Positive for diarrhea.  Genitourinary: Negative.   Musculoskeletal: Positive for myalgias and joint pain.  Skin: Negative.   Neurological: Negative.  Negative for weakness.  Endo/Heme/Allergies: Negative.   Psychiatric/Behavioral: The patient is nervous/anxious.     Past Medical History  Diagnosis Date  . Hypertension   . Diabetes mellitus without complication ([place])   . Hyperlipidemia   . Low serum vitamin D   . Abnormal CT of the abdomen 04/30/2016  . Ovarian cancer ([place]) 05/09/2016    Past Surgical History  Procedure Laterality Date  . Cesarean section    . Lumbar fusion  08/21/15    L3-L4 Dr. Timmothy Euler    Family History  Problem Relation Age of  Onset  . Cancer Mother   . Cancer Father   . Diabetes Paternal Grandmother   . Diabetes Paternal Grandfather     Social History   Social History  . Marital Status: Married    Spouse Name: N/A  . Number of Children: N/A  . Years of Education: N/A   Social History Main Topics  . Smoking status: Never Smoker   . Smokeless tobacco: Never Used  . Alcohol Use: No  . Drug Use: No  . Sexual Activity: Not Asked   Other Topics Concern  . None   Social History Narrative     PHYSICAL EXAMINATION  ECOG PERFORMANCE STATUS: 1 - Symptomatic but completely ambulatory  Filed Vitals:   05/22/16 0900  BP: 136/56  Pulse: 101  Temp: 98 F (36.7 C)  Resp: 18    GENERAL:alert, no distress, well nourished, well developed, comfortable, cooperative, obese and accompanied by her husband SKIN: skin color, texture, turgor are normal, no rashes or significant lesions HEAD: Normocephalic, No masses, lesions, tenderness or abnormalities EYES: normal, EOMI, Conjunctiva are pink and non-injected EARS: External ears normal OROPHARYNX:lips, buccal mucosa, and tongue normal and mucous membranes are moist  NECK: supple, trachea midline LYMPH:  no palpable lymphadenopathy BREAST:not examined LUNGS: clear to auscultation and percussion HEART: regular rate & rhythm, no murmurs, no gallops, S1 normal and S2 normal ABDOMEN:abdomen soft, non-tender, obese, normal bowel sounds and abdomen distended, but softer BACK: Back symmetric, no curvature., No CVA tenderness EXTREMITIES:less then 2 second capillary refill, no joint deformities, effusion, or inflammation, no skin discoloration, no clubbing, no cyanosis  NEURO: alert & oriented x 3 with fluent speech, no focal motor/sensory deficits, gait normal   LABORATORY DATA:  CBC    Component Value Date/Time   WBC 3.3* 05/22/2016 0826   WBC 5.0 12/21/2013 1200   RBC 3.73* 05/22/2016 0826   RBC 4.5 12/21/2013 1200   HGB 10.8* 05/22/2016 0826   HGB 12.6  12/21/2013 1200   HCT 32.5* 05/22/2016 0826   HCT 39.0 12/21/2013 1200   PLT 305 05/22/2016 0826   MCV 87.1 05/22/2016 0826   MCV 87.0 12/21/2013 1200   MCH 29.0 05/22/2016 0826   MCH 28.0 12/21/2013 1200   MCHC 33.2 05/22/2016 0826   MCHC 32.2 12/21/2013 1200   RDW 14.2 05/22/2016 0826   LYMPHSABS 0.2* 05/22/2016 0826   MONOABS 0.3 05/22/2016 0826   EOSABS 0.2 05/22/2016 0826   BASOSABS 0.0 05/22/2016 0826      Chemistry      Component Value Date/Time   NA 139 05/17/2016 0655   NA 139 02/06/2016 1148   K 4.6 05/17/2016 0655   CL 108 05/17/2016 0655   CO2 17* 05/13/2016 0736   BUN 26* 05/17/2016 0655   BUN 21 02/06/2016 1148   CREATININE 0.90 05/17/2016 0655      Component Value Date/Time   CALCIUM 8.7* 05/13/2016 0736   ALKPHOS 66 04/29/2016 1405   AST 21 04/29/2016 1405   ALT 17 04/29/2016 1405   BILITOT 0.7 04/29/2016 1405   BILITOT 0.3 07/02/2015 1005     Lab Results  Component Value Date   CA125 11113.0* 05/16/2016    PENDING LABS:   RADIOGRAPHIC STUDIES:  Dg Abd 1 View  04/29/2016  CLINICAL DATA:  Abdominal pain and distension, history of hyperlipidemia and diabetes EXAM: ABDOMEN - 1 VIEW COMPARISON:  None in PACs FINDINGS: The study is limited due to the patient's body habitus and resultant scatter effects. The visualized portions of the bowel exhibited normal stool and gas pattern. No free extraluminal gas collections are observed. The patient has undergone previous lateral and interbody fusion at L4-5. The fusion hardware appears intact. IMPRESSION: Very limited study. No definite acute intra-abdominal abnormality observed. Electronically Signed   By: David  Martinique M.D.   On: 04/29/2016 12:42   Ct Chest W Contrast  05/08/2016  CLINICAL DATA:  Staging for malignancy. Suspected peritoneal disease in the abdomen/pelvis. EXAM: CT CHEST WITH CONTRAST TECHNIQUE: Multidetector CT imaging of the chest was performed during intravenous contrast administration.  CONTRAST:  56mL ISOVUE-300 IOPAMIDOL (ISOVUE-300) INJECTION 61% COMPARISON:  Partial comparison to CT abdomen pelvis dated 04/29/2016. FINDINGS: Cardiovascular: Heart is normal in size.  No pericardial effusion. Mild coronary atherosclerosis in the LAD and right coronary artery. No evidence of thoracic aortic aneurysm. Mediastinum/Nodes: Small calcified mediastinal lymph nodes. No suspicious hilar or axillary lymphadenopathy. Visualized thyroid is unremarkable. Lungs/Pleura: Lungs are clear. No suspicious pulmonary nodules. No focal consolidation. No pleural effusion or pneumothorax. Upper Abdomen: Visualized upper abdomen is unchanged from recent CT, noting abdominal ascites, suspected peritoneal disease in the left upper abdomen (series 2/ image 130), and incompletely visualized omental caking (series 2/ image 141). Musculoskeletal: Degenerative changes of the visualized thoracolumbar spine. IMPRESSION: No evidence of metastatic disease in the chest. Peritoneal/omental disease with abdominal ascites in the upper abdomen, incompletely visualized. Electronically Signed   By: Julian Hy M.D.   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 Abdominal pain for several weeks. EXAM: TRANSABDOMINAL AND TRANSVAGINAL ULTRASOUND OF PELVIS TECHNIQUE: Both transabdominal and transvaginal ultrasound examinations of the pelvis were performed. Transabdominal technique  was performed for global imaging of the pelvis including uterus, ovaries, adnexal regions, and pelvic cul-de-sac. It was necessary to proceed with endovaginal exam following the transabdominal exam to visualize the uterus and ovaries. COMPARISON:  CT on 04/29/2016 FINDINGS: Both transabdominal and transvaginal exams are significantly limited due to large patient habitus and ascites. Uterus Measurements: Not well visualized by transabdominal or transvaginal sonography.  Endometrium Thickness: Not visualized. Right ovary Measurements: Not visualized by transabdominal or transvaginal sonography. Left ovary Measurements: Not visualized by transabdominal or transvaginal sonography. Other findings A moderate amount of complex free fluid is seen within the pelvis. Soft tissue nodularity is seen along the peritoneal surface in the pelvic cul-de-sac, which is suspicious for peritoneal carcinomatosis as seen on previous CT. No other pelvic mass identified although exam is significantly limited due to large patient habitus. IMPRESSION: Both transabdominal and transvaginal sonography are significantly limited by large patient habitus and ascites. Neither uterus or ovaries were visualized on this exam. Consider pelvic MRI without and with contrast for further evaluation. Moderate amount of complex ascites is seen, with soft tissue nodularity along the visualized peritoneal surface, suspicious for peritoneal carcinomatosis as seen on previous CT. Electronically Signed   By: Earle Gell M.D.   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 Abdominal pain for several weeks. EXAM: TRANSABDOMINAL AND TRANSVAGINAL ULTRASOUND OF PELVIS TECHNIQUE: Both transabdominal and transvaginal ultrasound examinations of the pelvis were performed. Transabdominal technique was performed for global imaging of the pelvis including uterus, ovaries, adnexal regions, and pelvic cul-de-sac. It was necessary to proceed with endovaginal exam following the transabdominal exam to visualize the uterus and ovaries. COMPARISON:  CT on 04/29/2016 FINDINGS: Both transabdominal and transvaginal exams are significantly limited due to large patient habitus and ascites. Uterus Measurements: Not well visualized by transabdominal or transvaginal sonography. Endometrium Thickness: Not visualized. Right ovary Measurements: Not visualized by  transabdominal or transvaginal sonography. Left ovary Measurements: Not visualized by transabdominal or transvaginal sonography. Other findings A moderate amount of complex free fluid is seen within the pelvis. Soft tissue nodularity is seen along the peritoneal surface in the pelvic cul-de-sac, which is suspicious for peritoneal carcinomatosis as seen on previous CT. No other pelvic mass identified although exam is significantly limited due to large patient habitus. IMPRESSION: Both transabdominal and transvaginal sonography are significantly limited by large patient habitus and ascites. Neither uterus or ovaries were visualized on this exam. Consider pelvic MRI without and with contrast for further evaluation. Moderate amount of complex ascites is seen, with soft tissue nodularity along the visualized peritoneal surface, suspicious for peritoneal carcinomatosis as seen on previous CT. Electronically Signed   By: Earle Gell M.D.   On: 05/01/2016 10:10   Ct Abdomen Pelvis W Contrast  04/29/2016  CLINICAL DATA:  Patient with intermittent abdominal pain for 3-4 weeks. EXAM: CT ABDOMEN AND PELVIS WITH CONTRAST TECHNIQUE: Multidetector CT imaging of the abdomen and pelvis was performed using the standard protocol following bolus administration of intravenous contrast. CONTRAST:  167mL ISOVUE-300 IOPAMIDOL (ISOVUE-300) INJECTION 61% COMPARISON:  None. FINDINGS: Lower chest: Heart is normal in size. No pericardial effusion. Dependent atelectasis within the bilateral lower lobes. No pleural effusion. Hepatobiliary: Liver is normal in size and contour. No focal hepatic lesions identified. Gallbladder is unremarkable. Pancreas: Unremarkable Spleen: Unremarkable Adrenals/Urinary Tract: Normal adrenal glands. Kidneys enhance symmetrically with contrast. There is a 3 mm nonobstructing stone within the superior pole of  the left kidney and a 4 mm nonobstructing stone within the inferior pole of the left kidney. Additional 4  mm stone interpolar region left kidney. Too small to characterize low-attenuation lesion inferior pole right kidney. Urinary bladder is unremarkable. Stomach/Bowel: No abnormal bowel wall thickening or evidence for bowel obstruction. Vascular/Lymphatic: Normal caliber abdominal aorta. Peripheral calcified atherosclerotic plaque. No retroperitoneal lymphadenopathy. Other: Masslike expansion of the uterine fundus. Adnexal structures unremarkable. There is perihepatic and perisplenic ascites. Free fluid within the paracolic gutters and within the pelvis. Omental caking is demonstrated within the anterior abdomen. Musculoskeletal: Lumbar spine degenerative changes. No aggressive or acute appearing osseous lesions. Edema involving the anterior abdominal wall. IMPRESSION: Extensive omental caking as well as moderate amount of ascites within the abdomen most compatible with peritoneal metastatic disease, of unknown primary. This may potentially be ovarian or a GI in etiology. There is masslike expansion of what appears to be the uterine fundus, likely secondary to a fibroid however given the extensive pathology in the abdomen, recommend correlation with pelvic ultrasound for more definitive characterization. Nonobstructing left-sided nephrolithiasis. Aortic atherosclerosis These results were called by telephone at the time of interpretation on 04/29/2016 at 4:15 pm to Dr. Sabra Heck, who verbally acknowledged these results. Electronically Signed   By: Lovey Newcomer M.D.   On: 04/29/2016 16:21   US Paracentesis  05/15/2016  INDICATION: Stage III ovarian/primary peritoneal/fallopian tube cancer, ascites EXAM: ULTRASOUND GUIDED  PARACENTESIS MEDICATIONS: None. COMPLICATIONS: None immediate. PROCEDURE: Procedure, benefits, and risks of procedure were discussed with patient. Written informed consent for procedure was obtained. Time out protocol followed. Adequate collection of ascites localized by ultrasound in RIGHT lower  quadrant. Skin prepped and draped in usual sterile fashion. Skin and soft tissues anesthetized with 10 mL of 1% lidocaine. 5 [place] Yueh catheter placed into peritoneal cavity. 3.4 L of amber colored fluid aspirated by vacuum bottle suction. Procedure tolerated well by patient without immediate complication. FINDINGS: A total of approximately 3.4 L of peritoneal fluid was removed. IMPRESSION: Successful ultrasound-guided paracentesis yielding 3.4 L liters of peritoneal fluid. Electronically Signed   By: Lavonia Dana M.D.   On: 05/15/2016 16:53   US Paracentesis  05/01/2016  INDICATION: Complex ascites and omental caking on recent CT suspicious for peritoneal carcinomatosis. EXAM: ULTRASOUND GUIDED DIAGNOSTIC AND THERAPEUTIC PARACENTESIS MEDICATIONS: None. COMPLICATIONS: None immediate. PROCEDURE: Procedure, benefits, and risks of procedure were discussed with patient. Written informed consent for procedure was obtained. Time out protocol followed. Adequate collection of ascites localized by ultrasound in RIGHT lower quadrant. Skin prepped and draped in usual sterile fashion. Skin and soft tissues anesthetized with 10 mL of 1% lidocaine. 5 [place] Yueh catheter 10 cm length placed into peritoneal cavity. 1.8 L of dark yellow fluid aspirated by vacuum bottle suction. Procedure tolerated well by patient without immediate complication. FINDINGS: A total of approximately 1.8 L of ascitic fluid was removed. Samples were sent to the laboratory as requested by the clinical team. IMPRESSION: Successful ultrasound-guided paracentesis yielding 1.8 liters of peritoneal fluid. Electronically Signed   By: Lavonia Dana M.D.   On: 05/01/2016 09:48   Ir Fluoro Guide Cv Line Right  05/13/2016  CLINICAL DATA:  Ovarian carcinoma and need for porta cath to begin chemotherapy. EXAM: IMPLANTED PORT A CATH PLACEMENT WITH ULTRASOUND AND FLUOROSCOPIC GUIDANCE ANESTHESIA/SEDATION: 5.0 Mg IV Versed; 100 mcg IV Fentanyl Total Moderate  Sedation Time:  38 minutes The patient's level of consciousness and physiologic status were continuously monitored during the procedure by Radiology nursing. Additional Medications:  2 g IV Ancef. As antibiotic prophylaxis, Ancef was ordered pre-procedure and administered intravenously within one hour of incision. FLUOROSCOPY TIME:  12 seconds. PROCEDURE: The procedure, risks, benefits, and alternatives were explained to the patient. Questions regarding the procedure were encouraged and answered. The patient understands and consents to the procedure. Ultrasound was used to confirm patency of the right internal jugular vein. The right neck and chest were prepped with chlorhexidine in a sterile fashion, and a sterile drape was applied covering the operative field. Maximum barrier sterile technique with sterile gowns and gloves were used for the procedure. Local anesthesia was provided with 1% lidocaine. After creating a small venotomy incision, a 21 gauge needle was advanced into the right internal jugular vein under direct, real-time ultrasound guidance. Ultrasound image documentation was performed. After securing guidewire access, an 8 Fr dilator was placed. A J-wire was kinked to measure appropriate catheter length. A subcutaneous port pocket was then created along the upper chest wall utilizing sharp and blunt dissection. Portable cautery was utilized. The pocket was irrigated with sterile saline. A single lumen power injectable port was chosen for placement. The 8 Fr catheter was tunneled from the port pocket site to the venotomy incision. The port was placed in the pocket. External catheter was trimmed to appropriate length based on guidewire measurement. At the venotomy, an 8 Fr peel-away sheath was placed over a guidewire. The catheter was then placed through the sheath and the sheath removed. Final catheter positioning was confirmed and documented with a fluoroscopic spot image. The port was accessed with a  needle and aspirated and flushed with heparinized saline. The needle was removed. The venotomy and port pocket incisions were closed with subcutaneous 3-0 Monocryl and subcuticular 4-0 Vicryl. Dermabond was applied to both incisions. COMPLICATIONS: None FINDINGS: After catheter placement, the tip lies at the cavoatrial junction. The catheter aspirates normally and is ready for immediate use. IMPRESSION: Placement of single lumen port a cath via right internal jugular vein. The catheter tip lies at the cavoatrial junction. A power injectable port a cath was placed and is ready for immediate use. Electronically Signed   By: Aletta Edouard M.D.   On: 05/13/2016 13:18   Ir US Guide Vasc Access Right  05/13/2016  CLINICAL DATA:  Ovarian carcinoma and need for porta cath to begin chemotherapy. EXAM: IMPLANTED PORT A CATH PLACEMENT WITH ULTRASOUND AND FLUOROSCOPIC GUIDANCE ANESTHESIA/SEDATION: 5.0 Mg IV Versed; 100 mcg IV Fentanyl Total Moderate Sedation Time:  38 minutes The patient's level of consciousness and physiologic status were continuously monitored during the procedure by Radiology nursing. Additional Medications: 2 g IV Ancef. As antibiotic prophylaxis, Ancef was ordered pre-procedure and administered intravenously within one hour of incision. FLUOROSCOPY TIME:  12 seconds. PROCEDURE: The procedure, risks, benefits, and alternatives were explained to the patient. Questions regarding the procedure were encouraged and answered. The patient understands and consents to the procedure. Ultrasound was used to confirm patency of the right internal jugular vein. The right neck and chest were prepped with chlorhexidine in a sterile fashion, and a sterile drape was applied covering the operative field. Maximum barrier sterile technique with sterile gowns and gloves were used for the procedure. Local anesthesia was provided with 1% lidocaine. After creating a small venotomy incision, a 21 gauge needle was advanced into  the right internal jugular vein under direct, real-time ultrasound guidance. Ultrasound image documentation was performed. After securing guidewire access, an 8 Fr dilator was placed. A J-wire was kinked to measure appropriate  catheter length. A subcutaneous port pocket was then created along the upper chest wall utilizing sharp and blunt dissection. Portable cautery was utilized. The pocket was irrigated with sterile saline. A single lumen power injectable port was chosen for placement. The 8 Fr catheter was tunneled from the port pocket site to the venotomy incision. The port was placed in the pocket. External catheter was trimmed to appropriate length based on guidewire measurement. At the venotomy, an 8 Fr peel-away sheath was placed over a guidewire. The catheter was then placed through the sheath and the sheath removed. Final catheter positioning was confirmed and documented with a fluoroscopic spot image. The port was accessed with a needle and aspirated and flushed with heparinized saline. The needle was removed. The venotomy and port pocket incisions were closed with subcutaneous 3-0 Monocryl and subcuticular 4-0 Vicryl. Dermabond was applied to both incisions. COMPLICATIONS: None FINDINGS: After catheter placement, the tip lies at the cavoatrial junction. The catheter aspirates normally and is ready for immediate use. IMPRESSION: Placement of single lumen port a cath via right internal jugular vein. The catheter tip lies at the cavoatrial junction. A power injectable port a cath was placed and is ready for immediate use. Electronically Signed   By: Aletta Edouard M.D.   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  Started on systemic chemotherapy  on 05/15/2016 consisting of Carboplatin/Paclitaxel after evaluation by Gyn Onc.  Oncology history updated.  ED visit from 05/17/2016 noted.  Labs today for NADIR check: CBC diff, CMET.  I personally reviewed and went over laboratory results with the patient.  The results are noted within this dictation.  She has been seen by Dr. Denman George on 05/09/2016: In her case, her extreme obesity and poorly controlled diabetes mellitus places her at increased perioperative risk and carries with it substantial (50%) risk for wound infection and dehiscence which can delay chemotherapy. For this reason the patient is electing for the approach of neoadjuvant chemotherapy for 3 cycles of q 21 day carboplatin AUC 6 and paclitaxel 175mg /m2. I recommend CT scan 1 week after day 1 of cycle 3 and I will see her within this cycle to review her CT images and plan for interval debulking surgery.  We reviewed side effects of Carboplatin/paclitaxel chemotherapy. Unfortunately, she experienced severe bone/muscle pain secondary to paclitaxel.  This occurred 24-48 hours post treatment.  She visited the ED as a result of this discomfort.  She was given Hydrocodone and Valium with significant improvement in control of her discomfort.  I will refill Valium Rx.  She is now having some issues with loose stool.  She notes that they are significantly improved with Imodium treatment.  She is encouraged to continue with good hydration.  Abdomen is improved.  She has an appointment with Genetic Counseling on 06/12/2016.  She will return in ~ 2 weeks as scheduled for follow-up and to embark on cycle 2 of treatment.  At that time, we will get her set-up for CT imaging following cycle #3 to evaluate response to therapy and keep Dr. Denman George apprised of the results.     ORDERS PLACED FOR THIS ENCOUNTER: Orders Placed This Encounter  Procedures  . CBC with Differential  . Comprehensive metabolic panel    MEDICATIONS PRESCRIBED THIS  ENCOUNTER: Meds  ordered this encounter  Medications  . DISCONTD: diazepam (VALIUM) 5 MG tablet    Sig: Take 1 tablet (5 mg total) by mouth every 4 (four) hours as needed (spasms).    Dispense:  30 tablet    Refill:  1    Order Specific Question:  Supervising Provider    Answer:  Patrici Ranks [id number]  . diazepam (VALIUM) 5 MG tablet    Sig: Take 0.5 tablets (2.5 mg total) by mouth every 4 (four) hours as needed (spasms).    Dispense:  30 tablet    Refill:  1    Order Specific Question:  Supervising Provider    Answer:  Patrici Ranks [id number]    THERAPY PLAN:  Continue with treatment as planned.  All questions were answered. The patient knows to call the clinic with any problems, questions or concerns. We can certainly see the patient much sooner if necessary.  Patient and plan discussed with Dr. Ancil Linsey and she is in agreement with the aforementioned.   This note is electronically signed by: Doy Mince 05/22/2016 9:37 PM

## 2016-05-25 ENCOUNTER — Other Ambulatory Visit: Payer: Self-pay | Admitting: Family Medicine

## 2016-05-26 ENCOUNTER — Telehealth: Payer: Self-pay | Admitting: Family Medicine

## 2016-05-26 NOTE — Telephone Encounter (Signed)
Denied.

## 2016-06-02 ENCOUNTER — Other Ambulatory Visit (HOSPITAL_COMMUNITY): Payer: Self-pay | Admitting: Emergency Medicine

## 2016-06-02 NOTE — Progress Notes (Signed)
Notified pt that she could take her pain medication until her pain from her shingles subsided

## 2016-06-05 ENCOUNTER — Encounter (HOSPITAL_COMMUNITY): Payer: Self-pay | Admitting: Oncology

## 2016-06-05 ENCOUNTER — Encounter (HOSPITAL_BASED_OUTPATIENT_CLINIC_OR_DEPARTMENT_OTHER): Payer: BC Managed Care – PPO | Admitting: Oncology

## 2016-06-05 ENCOUNTER — Encounter (HOSPITAL_COMMUNITY): Payer: BC Managed Care – PPO | Attending: Oncology

## 2016-06-05 VITALS — BP 146/51 | HR 90 | Temp 97.8°F | Resp 18 | Wt 231.0 lb

## 2016-06-05 DIAGNOSIS — E119 Type 2 diabetes mellitus without complications: Secondary | ICD-10-CM

## 2016-06-05 DIAGNOSIS — C569 Malignant neoplasm of unspecified ovary: Secondary | ICD-10-CM

## 2016-06-05 DIAGNOSIS — R935 Abnormal findings on diagnostic imaging of other abdominal regions, including retroperitoneum: Secondary | ICD-10-CM | POA: Diagnosis present

## 2016-06-05 DIAGNOSIS — C786 Secondary malignant neoplasm of retroperitoneum and peritoneum: Secondary | ICD-10-CM

## 2016-06-05 DIAGNOSIS — Z5111 Encounter for antineoplastic chemotherapy: Secondary | ICD-10-CM

## 2016-06-05 DIAGNOSIS — C801 Malignant (primary) neoplasm, unspecified: Secondary | ICD-10-CM | POA: Diagnosis not present

## 2016-06-05 DIAGNOSIS — E875 Hyperkalemia: Secondary | ICD-10-CM

## 2016-06-05 DIAGNOSIS — M5430 Sciatica, unspecified side: Secondary | ICD-10-CM

## 2016-06-05 LAB — COMPREHENSIVE METABOLIC PANEL
ALK PHOS: 68 U/L (ref 38–126)
ALT: 17 U/L (ref 14–54)
AST: 23 U/L (ref 15–41)
Albumin: 3.3 g/dL — ABNORMAL LOW (ref 3.5–5.0)
Anion gap: 7 (ref 5–15)
BUN: 41 mg/dL — AB (ref 6–20)
CALCIUM: 9.5 mg/dL (ref 8.9–10.3)
CO2: 19 mmol/L — AB (ref 22–32)
CREATININE: 1.05 mg/dL — AB (ref 0.44–1.00)
Chloride: 109 mmol/L (ref 101–111)
GFR, EST NON AFRICAN AMERICAN: 55 mL/min — AB (ref >60)
Glucose, Bld: 161 mg/dL — ABNORMAL HIGH (ref 65–99)
Potassium: 5.5 mmol/L — ABNORMAL HIGH (ref 3.5–5.1)
Sodium: 135 mmol/L (ref 135–145)
Total Bilirubin: 0.3 mg/dL (ref 0.3–1.2)
Total Protein: 6.7 g/dL (ref 6.5–8.1)

## 2016-06-05 LAB — CBC WITH DIFFERENTIAL/PLATELET
Basophils Absolute: 0 10*3/uL (ref 0.0–0.1)
Basophils Relative: 0 %
Eosinophils Absolute: 0 10*3/uL (ref 0.0–0.7)
Eosinophils Relative: 0 %
HCT: 31.7 % — ABNORMAL LOW (ref 36.0–46.0)
HEMOGLOBIN: 10.4 g/dL — AB (ref 12.0–15.0)
LYMPHS ABS: 0.5 10*3/uL — AB (ref 0.7–4.0)
Lymphocytes Relative: 4 %
MCH: 29.7 pg (ref 26.0–34.0)
MCHC: 32.8 g/dL (ref 30.0–36.0)
MCV: 90.6 fL (ref 78.0–100.0)
Monocytes Absolute: 0.2 10*3/uL (ref 0.1–1.0)
Monocytes Relative: 1 %
NEUTROS ABS: 12 10*3/uL — AB (ref 1.7–7.7)
Neutrophils Relative %: 95 %
Platelets: 301 10*3/uL (ref 150–400)
RBC: 3.5 MIL/uL — AB (ref 3.87–5.11)
RDW: 16.1 % — ABNORMAL HIGH (ref 11.5–15.5)
WBC: 12.6 10*3/uL — AB (ref 4.0–10.5)

## 2016-06-05 MED ORDER — SODIUM CHLORIDE 0.9 % IV SOLN
Freq: Once | INTRAVENOUS | Status: AC
  Administered 2016-06-05: 10:00:00 via INTRAVENOUS

## 2016-06-05 MED ORDER — SODIUM CHLORIDE 0.9% FLUSH
10.0000 mL | INTRAVENOUS | Status: DC | PRN
  Administered 2016-06-05: 10 mL
  Filled 2016-06-05: qty 10

## 2016-06-05 MED ORDER — HYDROCODONE-ACETAMINOPHEN 5-325 MG PO TABS: 1.0000 | ORAL_TABLET | ORAL | 0 refills | Status: DC | PRN

## 2016-06-05 MED ORDER — SODIUM POLYSTYRENE SULFONATE 15 GM/60ML PO SUSP
30.0000 g | Freq: Once | ORAL | Status: AC
  Administered 2016-06-05: 30 g via ORAL
  Filled 2016-06-05: qty 120

## 2016-06-05 MED ORDER — PACLITAXEL CHEMO INJECTION 300 MG/50ML
175.0000 mg/m2 | Freq: Once | INTRAVENOUS | Status: AC
  Administered 2016-06-05: 396 mg via INTRAVENOUS
  Filled 2016-06-05: qty 66

## 2016-06-05 MED ORDER — FAMOTIDINE IN NACL 20-0.9 MG/50ML-% IV SOLN
20.0000 mg | Freq: Once | INTRAVENOUS | Status: AC
  Administered 2016-06-05: 20 mg via INTRAVENOUS
  Filled 2016-06-05: qty 50

## 2016-06-05 MED ORDER — HEPARIN SOD (PORK) LOCK FLUSH 100 UNIT/ML IV SOLN
500.0000 [IU] | Freq: Once | INTRAVENOUS | Status: AC | PRN
  Administered 2016-06-05: 500 [IU]
  Filled 2016-06-05: qty 5

## 2016-06-05 MED ORDER — PALONOSETRON HCL INJECTION 0.25 MG/5ML
0.2500 mg | Freq: Once | INTRAVENOUS | Status: AC
  Administered 2016-06-05: 0.25 mg via INTRAVENOUS
  Filled 2016-06-05: qty 5

## 2016-06-05 MED ORDER — SODIUM CHLORIDE 0.9 % IV SOLN
20.0000 mg | Freq: Once | INTRAVENOUS | Status: AC
  Administered 2016-06-05: 20 mg via INTRAVENOUS
  Filled 2016-06-05: qty 2

## 2016-06-05 MED ORDER — DIPHENHYDRAMINE HCL 50 MG/ML IJ SOLN
50.0000 mg | Freq: Once | INTRAMUSCULAR | Status: AC
  Administered 2016-06-05: 50 mg via INTRAVENOUS
  Filled 2016-06-05: qty 1

## 2016-06-05 MED ORDER — SODIUM CHLORIDE 0.9 % IV SOLN
616.0000 mg | Freq: Once | INTRAVENOUS | Status: AC
  Administered 2016-06-05: 620 mg via INTRAVENOUS
  Filled 2016-06-05: qty 62

## 2016-06-05 NOTE — Assessment & Plan Note (Addendum)
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  Started on systemic chemotherapy on 05/15/2016 consisting of Carboplatin/Paclitaxel after evaluation by Gyn Onc.  Oncology history updated.  Pre-treatment labs today: CBC diff, CMET, CA 125.  I personally reviewed and went over laboratory results with the patient.  The results are noted within this dictation.  Hyperkalemia is noted with an elevated BUN as well.  We will hydrate her today and given kayexalate.  She is on an Ace-inhibitor.  We will recheck K+ on Monday.  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  Her zoster outbreak is healing nicely. She is using Hydrocodone for pain control which is very effective for her.  I have refilled her Hydrocodone.   She has been seen by Dr. Denman George on 05/09/2016: In her case, her extreme obesity and poorly controlled diabetes mellitus places her at increased perioperative risk and carries with it substantial (50%) risk for wound infection and dehiscence which can delay chemotherapy. For this reason the patient is electing for the approach of neoadjuvant chemotherapy for 3 cycles of q 21 day carboplatin AUC 6 and paclitaxel 175mg /m2. I recommend CT scan 1 week after day 1 of cycle 3 and I will see her within this cycle to review her CT images and plan for interval debulking surgery.   She has an appointment with Genetic Counseling on 06/12/2016.  CT CAP with contrast is ordered to be  Completed 1 week following cycle #3.    She will return in 3 weeks as scheduled for follow-up and to embark on cycle 3 of treatment. I will keep Dr. Denman George apprised of the patient's progress through therapy as she would like to see the patient following CT imaging after cycle #3.

## 2016-06-05 NOTE — Progress Notes (Signed)
Tolerated tx w/o adverse reaction.  Alert, in no distress.  VSS.  Discharged ambulatory in c/o spouse.  

## 2016-06-05 NOTE — Patient Instructions (Signed)
[facility] Discharge Instructions for Patients Receiving Chemotherapy   Beginning January 23rd 2017 lab work for the [facility] will be done in the  Main lab at [facility] on 1st floor. If you have a lab appointment with the [facility] please come in thru the  Main Entrance and check in at the main information desk   Today you received the following chemotherapy agents:  Taxol and carboplatin  If you develop nausea and vomiting, or diarrhea that is not controlled by your medication, call the clinic.  Exam and discussion today with [name], PA-C. Kayexalate given today to treat elevated potassium. Recheck potassium level on Monday (you can have this done at the lab downstairs; stop by the main entrance to register for this appointment). CT scan of chest, abdomen, and pelvis in 4 weeks. Return in 3 weeks for office visit and chemotherapy. Hydrocodone refill given.  The clinic phone number is [phone]. Office hours are Monday-Friday 8:30am-5:00pm.  BELOW ARE SYMPTOMS THAT SHOULD BE REPORTED IMMEDIATELY:  *FEVER GREATER THAN 101.0 F  *CHILLS WITH OR WITHOUT FEVER  NAUSEA AND VOMITING THAT IS NOT CONTROLLED WITH YOUR NAUSEA MEDICATION  *UNUSUAL SHORTNESS OF BREATH  *UNUSUAL BRUISING OR BLEEDING  TENDERNESS IN MOUTH AND THROAT WITH OR WITHOUT PRESENCE OF ULCERS  *URINARY PROBLEMS  *BOWEL PROBLEMS  UNUSUAL RASH Items with * indicate a potential emergency and should be followed up as soon as possible. If you have an emergency after office hours please contact your primary care physician or go to the nearest emergency department.  Please call the clinic during office hours if you have any questions or concerns.   You may also contact the Patient Navigator at [phone] should you have any questions or need assistance in obtaining follow up care.      Resources For Cancer Patients and their Caregivers ? American Cancer Society: Can assist  with transportation, wigs, general needs, runs Look Good Feel Better.        8583963245 ? Cancer Care: Provides financial assistance, online support groups, medication/co-pay assistance.  1-800-813-HOPE 251-533-4576) ? [place] Assists [place] cancer patients and their families through emotional , educational and financial support.  (847)287-7941 ? [place] DSS Where to apply for food stamps, Medicaid and utility assistance. (762)670-2047 ? [facility]: Transportation to medical appointments. (272)836-9050 ? Social Security Administration: May apply for disability if have a Stage IV cancer. ([phone] 414 347 2565 ? [employer], Disability and Transit Services: Assists with nutrition, care and transit needs. ([phone] [phone]

## 2016-06-05 NOTE — Progress Notes (Signed)
Ellen Honour, MD [place] [address]  Ovarian cancer, unspecified laterality [facility]) - Plan: CT Chest W Contrast, CT Abdomen Pelvis W Contrast  Sciatica, unspecified laterality - Plan: HYDROcodone-acetaminophen (NORCO) 5-325 MG tablet  Hyperkalemia - Plan: Potassium, sodium polystyrene (KAYEXALATE) 15 GM/60ML suspension 30 g  CURRENT THERAPY: Carboplatin/Paclitaxel beginning on 05/15/2016.  INTERVAL HISTORY: Ellen Hunt 65 y.o. 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  Started on systemic chemotherapy on 05/15/2016 consisting of Carboplatin/Paclitaxel after evaluation by Gyn Onc.    Ovarian cancer ([place])   04/29/2016 Imaging    CT abd/pelvis- Extensive omental caking as well as moderate amount of ascites within the abdomen most compatible with peritoneal metastatic disease, of unknown primary. This may potentially be ovarian or a GI in etiology.     04/30/2016 Tumor Marker    CA 125- 7149.0 (H)     05/01/2016 Procedure    US paracentesis- Successful ultrasound-guided paracentesis yielding 1.8 liters of peritoneal fluid.     05/01/2016 Imaging    US pelvis- Both transabdominal and transvaginal sonography are significantly limited by large patient habitus and ascites. Neither uterus or ovaries were visualized on this exam.     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     05/08/2016 Imaging    CT chest- No evidence of metastatic disease in the chest. Peritoneal/omental disease with abdominal ascites in the upper abdomen, incompletely visualized.      05/13/2016 Procedure    Placement of single lumen port a cath via right internal jugular  vein. The catheter tip lies at the cavoatrial junction. A power injectable port a cath was placed and is ready for immediate use.     05/15/2016 Procedure    US Paracentesis- 3400 ml yellow colored ascites removed     05/15/2016 -  Chemotherapy    Carboplatin/Paclitaxel every 21 days      Today, she reports progression of her pain associated with her herpes zoster outbreak.  The lesions are healing nicely, but she notes an increase in pain since decreasing anti-viral therapy to maintenance dose.  She reports that the episodes happen once per day.  She is using her hydrocodone for this pain and this is effective.  She notes that the pain occurs suddenly and then resolves after a few moments.  The pain follows her affected dermatome.   She otherwise denies any complaints.  She notes that her breathing is improved and her abdomen is feeling much better following her first cycle of chemotherapy.  Review of Systems  Constitutional: Negative for chills, fever and weight loss.  HENT: Negative.   Eyes: Negative.   Respiratory: Negative.  Negative for cough and shortness of breath.   Cardiovascular: Negative.  Negative for chest pain.  Gastrointestinal: Negative for abdominal pain, constipation, diarrhea, nausea and vomiting.  Genitourinary: Negative.   Musculoskeletal: Negative for joint pain and myalgias.  Skin: Negative.   Neurological: Negative.  Negative for weakness.  Endo/Heme/Allergies: Negative.   Psychiatric/Behavioral: The patient is nervous/anxious.     Past Medical History:  Diagnosis Date  . Abnormal CT of the abdomen 04/30/2016  . Diabetes mellitus without complication ([place])   . Hyperlipidemia   . Hypertension   . Low serum  vitamin D   . Ovarian cancer ([place]) 05/09/2016    Past Surgical History:  Procedure Laterality Date  . CESAREAN SECTION    . LUMBAR FUSION  08/21/15   L3-L4 Dr. Timmothy Euler    Family History  Problem Relation Age of Onset  . Cancer Mother   .  Cancer Father   . Diabetes Paternal Grandmother   . Diabetes Paternal Grandfather     Social History   Social History  . Marital status: Married    Spouse name: N/A  . Number of children: N/A  . Years of education: N/A   Social History Main Topics  . Smoking status: Never Smoker  . Smokeless tobacco: Never Used  . Alcohol use No  . Drug use: No  . Sexual activity: Not Asked   Other Topics Concern  . None   Social History Narrative  . None     PHYSICAL EXAMINATION  ECOG PERFORMANCE STATUS: 1 - Symptomatic but completely ambulatory  Vitals:   06/05/16 0906  BP: (!) 146/51  Pulse: 90  Resp: 18  Temp: 97.8 F (36.6 C)    GENERAL:alert, no distress, well nourished, well developed, comfortable, cooperative, obese and accompanied by her husband, in chemo-recliner. SKIN: skin color, texture, turgor are normal, no rashes or significant lesions HEAD: Normocephalic, No masses, lesions, tenderness or abnormalities EYES: normal, EOMI, Conjunctiva are pink and non-injected EARS: External ears normal OROPHARYNX:lips, buccal mucosa, and tongue normal and mucous membranes are moist  NECK: supple, trachea midline LYMPH:  no palpable lymphadenopathy BREAST:not examined LUNGS: clear to auscultation and percussion HEART: regular rate & rhythm, no murmurs, no gallops, S1 normal and S2 normal ABDOMEN:abdomen soft, non-tender, obese, normal bowel sounds and abdomen distended, but softer BACK: Back symmetric, no curvature., No CVA tenderness EXTREMITIES:less then 2 second capillary refill, no joint deformities, effusion, or inflammation, no skin discoloration, no clubbing, no cyanosis  NEURO: alert & oriented x 3 with fluent speech, no focal motor/sensory deficits, gait normal   LABORATORY DATA: CBC    Component Value Date/Time   WBC 12.6 (H) 06/05/2016 0919   RBC 3.50 (L) 06/05/2016 0919   HGB 10.4 (L) 06/05/2016 0919   HCT 31.7 (L) 06/05/2016 0919   PLT 301 06/05/2016 0919     MCV 90.6 06/05/2016 0919   MCV 87.0 12/21/2013 1200   MCH 29.7 06/05/2016 0919   MCHC 32.8 06/05/2016 0919   RDW 16.1 (H) 06/05/2016 0919   LYMPHSABS 0.5 (L) 06/05/2016 0919   MONOABS 0.2 06/05/2016 0919   EOSABS 0.0 06/05/2016 0919   BASOSABS 0.0 06/05/2016 0919      Chemistry      Component Value Date/Time   NA 135 06/05/2016 0919   NA 139 02/06/2016 1148   K 5.5 (H) 06/05/2016 0919   CL 109 06/05/2016 0919   CO2 19 (L) 06/05/2016 0919   BUN 41 (H) 06/05/2016 0919   BUN 21 02/06/2016 1148   CREATININE 1.05 (H) 06/05/2016 0919      Component Value Date/Time   CALCIUM 9.5 06/05/2016 0919   ALKPHOS 68 06/05/2016 0919   AST 23 06/05/2016 0919   ALT 17 06/05/2016 0919   BILITOT 0.3 06/05/2016 0919   BILITOT 0.3 07/02/2015 1005     Lab Results  Component Value Date   CA125 11,113.0 (H) 05/16/2016    PENDING LABS:   RADIOGRAPHIC STUDIES:  Ct Chest W Contrast  Result Date: 05/08/2016 CLINICAL DATA:  Staging for malignancy. Suspected peritoneal disease in the abdomen/pelvis.  EXAM: CT CHEST WITH CONTRAST TECHNIQUE: Multidetector CT imaging of the chest was performed during intravenous contrast administration. CONTRAST:  45mL ISOVUE-300 IOPAMIDOL (ISOVUE-300) INJECTION 61% COMPARISON:  Partial comparison to CT abdomen pelvis dated 04/29/2016. FINDINGS: Cardiovascular: Heart is normal in size.  No pericardial effusion. Mild coronary atherosclerosis in the LAD and right coronary artery. No evidence of thoracic aortic aneurysm. Mediastinum/Nodes: Small calcified mediastinal lymph nodes. No suspicious hilar or axillary lymphadenopathy. Visualized thyroid is unremarkable. Lungs/Pleura: Lungs are clear. No suspicious pulmonary nodules. No focal consolidation. No pleural effusion or pneumothorax. Upper Abdomen: Visualized upper abdomen is unchanged from recent CT, noting abdominal ascites, suspected peritoneal disease in the left upper abdomen (series 2/ image 130), and incompletely  visualized omental caking (series 2/ image 141). Musculoskeletal: Degenerative changes of the visualized thoracolumbar spine. IMPRESSION: No evidence of metastatic disease in the chest. Peritoneal/omental disease with abdominal ascites in the upper abdomen, incompletely visualized. Electronically Signed   By: Julian Hy M.D.   On: 05/08/2016 18:07   US Paracentesis  Result Date: 05/15/2016 INDICATION: Stage III ovarian/primary peritoneal/fallopian tube cancer, ascites EXAM: ULTRASOUND GUIDED  PARACENTESIS MEDICATIONS: None. COMPLICATIONS: None immediate. PROCEDURE: Procedure, benefits, and risks of procedure were discussed with patient. Written informed consent for procedure was obtained. Time out protocol followed. Adequate collection of ascites localized by ultrasound in RIGHT lower quadrant. Skin prepped and draped in usual sterile fashion. Skin and soft tissues anesthetized with 10 mL of 1% lidocaine. 5 [place] Yueh catheter placed into peritoneal cavity. 3.4 L of amber colored fluid aspirated by vacuum bottle suction. Procedure tolerated well by patient without immediate complication. FINDINGS: A total of approximately 3.4 L of peritoneal fluid was removed. IMPRESSION: Successful ultrasound-guided paracentesis yielding 3.4 L liters of peritoneal fluid. Electronically Signed   By: Lavonia Dana M.D.   On: 05/15/2016 16:53   Ir Fluoro Guide Cv Line Right  Result Date: 05/13/2016 CLINICAL DATA:  Ovarian carcinoma and need for porta cath to begin chemotherapy. EXAM: IMPLANTED PORT A CATH PLACEMENT WITH ULTRASOUND AND FLUOROSCOPIC GUIDANCE ANESTHESIA/SEDATION: 5.0 Mg IV Versed; 100 mcg IV Fentanyl Total Moderate Sedation Time:  38 minutes The patient's level of consciousness and physiologic status were continuously monitored during the procedure by Radiology nursing. Additional Medications: 2 g IV Ancef. As antibiotic prophylaxis, Ancef was ordered pre-procedure and administered intravenously within one  hour of incision. FLUOROSCOPY TIME:  12 seconds. PROCEDURE: The procedure, risks, benefits, and alternatives were explained to the patient. Questions regarding the procedure were encouraged and answered. The patient understands and consents to the procedure. Ultrasound was used to confirm patency of the right internal jugular vein. The right neck and chest were prepped with chlorhexidine in a sterile fashion, and a sterile drape was applied covering the operative field. Maximum barrier sterile technique with sterile gowns and gloves were used for the procedure. Local anesthesia was provided with 1% lidocaine. After creating a small venotomy incision, a 21 gauge needle was advanced into the right internal jugular vein under direct, real-time ultrasound guidance. Ultrasound image documentation was performed. After securing guidewire access, an 8 Fr dilator was placed. A J-wire was kinked to measure appropriate catheter length. A subcutaneous port pocket was then created along the upper chest wall utilizing sharp and blunt dissection. Portable cautery was utilized. The pocket was irrigated with sterile saline. A single lumen power injectable port was chosen for placement. The 8 Fr catheter was tunneled from the port pocket site to the venotomy incision. The port was placed in the  pocket. External catheter was trimmed to appropriate length based on guidewire measurement. At the venotomy, an 8 Fr peel-away sheath was placed over a guidewire. The catheter was then placed through the sheath and the sheath removed. Final catheter positioning was confirmed and documented with a fluoroscopic spot image. The port was accessed with a needle and aspirated and flushed with heparinized saline. The needle was removed. The venotomy and port pocket incisions were closed with subcutaneous 3-0 Monocryl and subcuticular 4-0 Vicryl. Dermabond was applied to both incisions. COMPLICATIONS: None FINDINGS: After catheter placement, the tip  lies at the cavoatrial junction. The catheter aspirates normally and is ready for immediate use. IMPRESSION: Placement of single lumen port a cath via right internal jugular vein. The catheter tip lies at the cavoatrial junction. A power injectable port a cath was placed and is ready for immediate use. Electronically Signed   By: Aletta Edouard M.D.   On: 05/13/2016 13:18   Ir US Guide Vasc Access Right  Result Date: 05/13/2016 CLINICAL DATA:  Ovarian carcinoma and need for porta cath to begin chemotherapy. EXAM: IMPLANTED PORT A CATH PLACEMENT WITH ULTRASOUND AND FLUOROSCOPIC GUIDANCE ANESTHESIA/SEDATION: 5.0 Mg IV Versed; 100 mcg IV Fentanyl Total Moderate Sedation Time:  38 minutes The patient's level of consciousness and physiologic status were continuously monitored during the procedure by Radiology nursing. Additional Medications: 2 g IV Ancef. As antibiotic prophylaxis, Ancef was ordered pre-procedure and administered intravenously within one hour of incision. FLUOROSCOPY TIME:  12 seconds. PROCEDURE: The procedure, risks, benefits, and alternatives were explained to the patient. Questions regarding the procedure were encouraged and answered. The patient understands and consents to the procedure. Ultrasound was used to confirm patency of the right internal jugular vein. The right neck and chest were prepped with chlorhexidine in a sterile fashion, and a sterile drape was applied covering the operative field. Maximum barrier sterile technique with sterile gowns and gloves were used for the procedure. Local anesthesia was provided with 1% lidocaine. After creating a small venotomy incision, a 21 gauge needle was advanced into the right internal jugular vein under direct, real-time ultrasound guidance. Ultrasound image documentation was performed. After securing guidewire access, an 8 Fr dilator was placed. A J-wire was kinked to measure appropriate catheter length. A subcutaneous port pocket was then  created along the upper chest wall utilizing sharp and blunt dissection. Portable cautery was utilized. The pocket was irrigated with sterile saline. A single lumen power injectable port was chosen for placement. The 8 Fr catheter was tunneled from the port pocket site to the venotomy incision. The port was placed in the pocket. External catheter was trimmed to appropriate length based on guidewire measurement. At the venotomy, an 8 Fr peel-away sheath was placed over a guidewire. The catheter was then placed through the sheath and the sheath removed. Final catheter positioning was confirmed and documented with a fluoroscopic spot image. The port was accessed with a needle and aspirated and flushed with heparinized saline. The needle was removed. The venotomy and port pocket incisions were closed with subcutaneous 3-0 Monocryl and subcuticular 4-0 Vicryl. Dermabond was applied to both incisions. COMPLICATIONS: None FINDINGS: After catheter placement, the tip lies at the cavoatrial junction. The catheter aspirates normally and is ready for immediate use. IMPRESSION: Placement of single lumen port a cath via right internal jugular vein. The catheter tip lies at the cavoatrial junction. A power injectable port a cath was placed and is ready for immediate use. Electronically Signed   By:  Aletta Edouard M.D.   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  Started on systemic chemotherapy on 05/15/2016 consisting of Carboplatin/Paclitaxel after evaluation by Gyn Onc.  Oncology history updated.  Pre-treatment labs today: CBC diff, CMET, CA 125.  I personally reviewed and went over laboratory results with the patient.  The results are noted within this  dictation.  Hyperkalemia is noted with an elevated BUN as well.  We will hydrate her today and given kayexalate.  She is on an Ace-inhibitor.  We will recheck K+ on Monday.  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  Her zoster outbreak is healing nicely. She is using Hydrocodone for pain control which is very effective for her.  I have refilled her Hydrocodone.   She has been seen by Dr. Denman George on 05/09/2016: In her case, her extreme obesity and poorly controlled diabetes mellitus places her at increased perioperative risk and carries with it substantial (50%) risk for wound infection and dehiscence which can delay chemotherapy. For this reason the patient is electing for the approach of neoadjuvant chemotherapy for 3 cycles of q 21 day carboplatin AUC 6 and paclitaxel 175mg /m2. I recommend CT scan 1 week after day 1 of cycle 3 and I will see her within this cycle to review her CT images and plan for interval debulking surgery.   She has an appointment with Genetic Counseling on 06/12/2016.  CT CAP with contrast is ordered to be  Completed 1 week following cycle #3.    She will return in 3 weeks as scheduled for follow-up and to embark on cycle 3 of treatment. I will keep Dr. Denman George apprised of the patient's progress through therapy as she would like to see the patient following CT imaging after cycle #3.   ORDERS PLACED FOR THIS ENCOUNTER: Orders Placed This Encounter  Procedures  . CT Chest W Contrast  . CT Abdomen Pelvis W Contrast  . Potassium    MEDICATIONS PRESCRIBED THIS ENCOUNTER: Meds ordered this encounter  Medications  . HYDROcodone-acetaminophen (NORCO) 5-325 MG tablet    Sig: Take 1-2 tablets by mouth every 4 (four) hours as needed for moderate pain.    Dispense:  120 tablet    Refill:  0    Order Specific Question:   Supervising Provider    Answer:   Patrici Ranks [id number]  . sodium polystyrene (KAYEXALATE) 15 GM/60ML suspension 30 g    THERAPY PLAN:    Continue with treatment as planned.  All questions were answered. The patient knows to call the clinic with any problems, questions or concerns. We can certainly see the patient much sooner if necessary.  Patient and plan discussed with Dr. Ancil Linsey and she is in agreement with the aforementioned.   This note is electronically signed by: Doy Mince 06/05/2016 9:23 PM

## 2016-06-06 LAB — CA 125: CA 125: 12359 U/mL — AB (ref 0.0–38.1)

## 2016-06-09 ENCOUNTER — Encounter (HOSPITAL_COMMUNITY): Payer: BC Managed Care – PPO

## 2016-06-09 DIAGNOSIS — E875 Hyperkalemia: Secondary | ICD-10-CM

## 2016-06-09 DIAGNOSIS — R935 Abnormal findings on diagnostic imaging of other abdominal regions, including retroperitoneum: Secondary | ICD-10-CM | POA: Diagnosis not present

## 2016-06-09 LAB — POTASSIUM: POTASSIUM: 4.8 mmol/L (ref 3.5–5.1)

## 2016-06-10 ENCOUNTER — Encounter: Payer: Self-pay | Admitting: Genetic Counselor

## 2016-06-12 ENCOUNTER — Encounter (HOSPITAL_COMMUNITY): Payer: BC Managed Care – PPO

## 2016-06-12 ENCOUNTER — Encounter (HOSPITAL_COMMUNITY): Payer: Self-pay | Admitting: Genetic Counselor

## 2016-06-12 ENCOUNTER — Encounter (HOSPITAL_BASED_OUTPATIENT_CLINIC_OR_DEPARTMENT_OTHER): Payer: BC Managed Care – PPO | Admitting: Genetic Counselor

## 2016-06-12 DIAGNOSIS — Z803 Family history of malignant neoplasm of breast: Secondary | ICD-10-CM | POA: Diagnosis not present

## 2016-06-12 DIAGNOSIS — Z315 Encounter for genetic counseling: Secondary | ICD-10-CM | POA: Diagnosis not present

## 2016-06-12 DIAGNOSIS — C569 Malignant neoplasm of unspecified ovary: Secondary | ICD-10-CM | POA: Diagnosis not present

## 2016-06-12 DIAGNOSIS — R935 Abnormal findings on diagnostic imaging of other abdominal regions, including retroperitoneum: Secondary | ICD-10-CM | POA: Diagnosis not present

## 2016-06-12 LAB — COMPREHENSIVE METABOLIC PANEL
ALT: 19 U/L (ref 14–54)
ANION GAP: 8 (ref 5–15)
AST: 26 U/L (ref 15–41)
Albumin: 3.4 g/dL — ABNORMAL LOW (ref 3.5–5.0)
Alkaline Phosphatase: 73 U/L (ref 38–126)
BUN: 31 mg/dL — ABNORMAL HIGH (ref 6–20)
CALCIUM: 8.9 mg/dL (ref 8.9–10.3)
CHLORIDE: 106 mmol/L (ref 101–111)
CO2: 23 mmol/L (ref 22–32)
Creatinine, Ser: 1 mg/dL (ref 0.44–1.00)
GFR calc non Af Amer: 59 mL/min — ABNORMAL LOW (ref >60)
Glucose, Bld: 117 mg/dL — ABNORMAL HIGH (ref 65–99)
POTASSIUM: 4.6 mmol/L (ref 3.5–5.1)
SODIUM: 137 mmol/L (ref 135–145)
Total Bilirubin: 0.2 mg/dL — ABNORMAL LOW (ref 0.3–1.2)
Total Protein: 6.7 g/dL (ref 6.5–8.1)

## 2016-06-12 LAB — CBC WITH DIFFERENTIAL/PLATELET
BASOS PCT: 1 %
Basophils Absolute: 0 10*3/uL (ref 0.0–0.1)
EOS PCT: 8 %
Eosinophils Absolute: 0.2 10*3/uL (ref 0.0–0.7)
HCT: 30.4 % — ABNORMAL LOW (ref 36.0–46.0)
Hemoglobin: 9.9 g/dL — ABNORMAL LOW (ref 12.0–15.0)
Lymphocytes Relative: 9 %
Lymphs Abs: 0.3 10*3/uL — ABNORMAL LOW (ref 0.7–4.0)
MCH: 29.5 pg (ref 26.0–34.0)
MCHC: 32.6 g/dL (ref 30.0–36.0)
MCV: 90.5 fL (ref 78.0–100.0)
MONO ABS: 0.2 10*3/uL (ref 0.1–1.0)
Monocytes Relative: 6 %
Neutro Abs: 2.3 10*3/uL (ref 1.7–7.7)
Neutrophils Relative %: 76 %
PLATELETS: 186 10*3/uL (ref 150–400)
RBC: 3.36 MIL/uL — ABNORMAL LOW (ref 3.87–5.11)
RDW: 16.3 % — AB (ref 11.5–15.5)
WBC: 3 10*3/uL — ABNORMAL LOW (ref 4.0–10.5)

## 2016-06-12 NOTE — Progress Notes (Signed)
REFERRING PROVIDER: [name], MD Ellen Hunt, Ellen Hunt [id number]   Shannon Penland, MD  PRIMARY PROVIDER:  [name], MD  PRIMARY REASON FOR VISIT:  1. Ovarian cancer, unspecified laterality ([place])   2. Family history of breast cancer      HISTORY OF PRESENT ILLNESS:   Ellen Hunt, a 65 y.o. female, was seen for a [facility] cancer genetics consultation at the request of Dr. Sabra Heck due to a personal and family history of cancer.  Ellen Hunt presents to clinic today to discuss the possibility of a hereditary predisposition to cancer, genetic testing, and to further clarify her future cancer risks, as well as potential cancer risks for family members.   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 This will be treated with chemotherapy and surgery.    CANCER HISTORY:    Ovarian cancer ([place])   04/29/2016 Imaging    CT abd/pelvis- Extensive omental caking as well as moderate amount of ascites within the abdomen most compatible with peritoneal metastatic disease, of unknown primary. This may potentially be ovarian or a GI in etiology.     04/30/2016 Tumor Marker    CA 125- 7149.0 (H)     05/01/2016 Procedure    US paracentesis- Successful ultrasound-guided paracentesis yielding 1.8 liters of peritoneal fluid.     05/01/2016 Imaging    US pelvis- Both transabdominal and transvaginal sonography are significantly limited by large patient habitus and ascites. Neither uterus or ovaries were visualized on this exam.     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     05/08/2016 Imaging    CT chest- No evidence of metastatic disease in the chest. Peritoneal/omental disease with abdominal ascites in the upper abdomen, incompletely visualized.      05/13/2016 Procedure    Placement of single lumen port a cath via right  internal jugular vein. The catheter tip lies at the cavoatrial junction. A power injectable port a cath was placed and is ready for immediate use.     05/15/2016 Procedure    US Paracentesis- 3400 ml yellow colored ascites removed     05/15/2016 -  Chemotherapy    Carboplatin/Paclitaxel every 21 days       HORMONAL RISK FACTORS:  Menarche was at age 69.  First live birth at age 89.  OCP use for approximately <1 years.  Ovaries intact: yes.  Hysterectomy: no.  Menopausal status: postmenopausal.  HRT use: 0 years. Colonoscopy: yes; normal. Mammogram within the last year: no. Number of breast biopsies: 0. Up to date with pelvic exams:  no. Any excessive radiation exposure in the past:  no  Past Medical History:  Diagnosis Date  . Abnormal CT of the abdomen 04/30/2016  . Diabetes mellitus without complication ([place])   . Family history of breast cancer   . Hyperlipidemia   . Hypertension   . Low serum vitamin D   . Ovarian cancer ([place]) 05/09/2016    Past Surgical History:  Procedure Laterality Date  . CESAREAN SECTION    . LUMBAR FUSION  08/21/15   L3-L4 Dr. Timmothy Euler    Social History   Social History  . Marital status: Married    Spouse name: Gershon Mussel  . Number of children: 2  . Years of education: N/A   Social History Main Topics  . Smoking status:  Never Smoker  . Smokeless tobacco: Never Used  . Alcohol use No  . Drug use: No  . Sexual activity: Not Asked   Other Topics Concern  . None   Social History Narrative  . None     FAMILY HISTORY:  We obtained a detailed, 4-generation family history.  Significant diagnoses are listed below: Family History  Problem Relation Age of Onset  . Lung cancer Mother     smoker; dx in her 48s  . Leukemia Father   . Diabetes Paternal Grandmother   . Heart attack Paternal Grandmother   . Diabetes Paternal Grandfather   . Breast cancer Paternal Aunt     dx in her 38s-30s  . Heart attack Maternal Grandfather   . Breast  cancer Cousin     maternal first cousin    The patient has two children who are cancer free.  She has a maternal half sister and a full brother, both who are cancer free.  Her parents are both deceased.  Her mother was a smoker and died of lung cancer.  Her mother had two sisters who died of non cancer related issues.  One sister had a daughter with breast cancer.  The patient's maternal grandmother died at 23 and her grandfather died of a heart attack.  The patient's father died of leukemia at 76.  He had a brother and sister.  The sister developed breast cancer during the pregnancy of her 6th child.  She died soon after giving birth.  There is no other reported cancer history.  Patient's maternal ancestors are of [place] descent, and paternal ancestors are of [place] descent. There is no reported Ashkenazi Jewish ancestry. There is no known consanguinity.  GENETIC COUNSELING ASSESSMENT: Ellen Hunt is a 65 y.o. female with a personal and family history of cancer which is somewhat suggestive of a hereditary cancer syndrome and predisposition to cancer. We, therefore, discussed and recommended the following at today's visit.   DISCUSSION: We discussed that about 15-20% of ovarian cancer is due to hereditary cancer mutations, most commonly BRCA mutations, but also sometimes Lynch syndrome.  Individuals who test positive for BRCA mutations and have ovarian cancer may be eligible for PARP inhibitor chemotherapy.  There are other genes that can increase the risk for ovarian cancer including BRIP1, RAD51C and RAD51D, and other genes that can increase the risk for breast cancer, including ATM, PALB2 and CHEK2.  The patient was not familiar with some of the health related issues of her maternal cousins.  She will ask her sister and get back to me if there is anything to update.  We reviewed the characteristics, features and inheritance patterns of hereditary cancer syndromes. We also discussed genetic testing,  including the appropriate family members to test, the process of testing, insurance coverage and turn-around-time for results. We discussed the implications of a negative, positive and/or variant of uncertain significant result. We recommended Ellen Hunt pursue genetic testing for the Breast/Ovarian cancer gene panel. The Breast/Ovarian gene panel offered by [facility] includes sequencing and rearrangement analysis for the following 20 genes:  ATM, BARD1, BRCA1, BRCA2, BRIP1, CDH1, CHEK2, EPCAM, FANCC, MLH1, MSH2, MSH6, NBN, PALB2, PMS2, PTEN, RAD51C, RAD51D, TP53, and XRCC2.     Based on Ellen Hunt's personal and family history of cancer, she meets medical criteria for genetic testing. Despite that she meets criteria, she may still have an out of pocket cost. We discussed that if her out of pocket cost for testing is over $100,  the laboratory will call and confirm whether she wants to proceed with testing.  If the out of pocket cost of testing is less than $100 she will be billed by the genetic testing laboratory.   PLAN: After considering the risks, benefits, and limitations, Ellen Hunt  provided informed consent to pursue genetic testing and the blood sample was sent to [facility] for analysis of the Breast/Ovarian cancer panel. Results should be available within approximately 2-3 weeks' time, at which point they will be disclosed by telephone to Ellen Hunt, as will any additional recommendations warranted by these results. Ellen Hunt will receive a summary of her genetic counseling visit and a copy of her results once available. This information will also be available in Epic. We encouraged Ellen Hunt to remain in contact with cancer genetics annually so that we can continuously update the family history and inform her of any changes in cancer genetics and testing that may be of benefit for her family. Ellen Hunt questions were answered to her satisfaction today. Our contact information was provided should  additional questions or concerns arise.  Lastly, we encouraged Ellen Hunt to remain in contact with cancer genetics annually so that we can continuously update the family history and inform her of any changes in cancer genetics and testing that may be of benefit for this family.   Ms.  Hunt questions were answered to her satisfaction today. Our contact information was provided should additional questions or concerns arise. Thank you for the referral and allowing [place] to share in the care of your patient.   Karen P. Florene Glen, [place], [facility] Certified Genetic Counselor [email or web address] phone: [phone]  The patient was seen for a total of 45 minutes in face-to-face genetic counseling.  This patient was discussed with Drs. Magrinat, Lindi Adie and/or Burr Medico who agrees with the above.    _______________________________________________________________________ For Office Staff:  Number of people involved in session: 2 Was an Intern/ student involved with case: no

## 2016-06-13 LAB — CA 125: CA 125: 12650 U/mL — AB (ref 0.0–38.1)

## 2016-06-15 ENCOUNTER — Encounter (HOSPITAL_COMMUNITY): Payer: Self-pay | Admitting: Hematology & Oncology

## 2016-06-17 ENCOUNTER — Encounter: Payer: Self-pay | Admitting: *Deleted

## 2016-06-17 NOTE — Progress Notes (Signed)
[place] Psychosocial Distress Screening Clinical Social Work  Clinical Social Work was referred by distress screening protocol.  The patient scored a 8 on the Psychosocial Distress Thermometer which indicates severe distress. Clinical Social Worker phoned pt to assess for distress and other psychosocial needs. Pt shared she had had some issues with the phone system and eventually had her needs addressed in the ED. Pt reports to have good support from family and friends. She reports her anxiety has gotten better and her biggest issue has been her shingles on her face. CSW reviewed Support Program resources for her as well and encouraged pt to consider those for additional support. Pt appreciated call and agrees to reach out as needed.   ONCBCN DISTRESS SCREENING 05/15/2016  Distress experienced in past week (1-10) 8  Practical problem type (No Data)  Family Problem type (No Data)  Emotional problem type Nervousness/Anxiety;Adjusting to illness  Spiritual/Religous concerns type Facing my mortality  Physical Problem type Pain;Nausea/vomiting;Sleep/insomnia;Loss of appetitie;Constipation/diarrhea;Swollen arms/legs;Other (comment)  Referral to clinical social work Yes    Clinical Social Worker follow up needed: No.  If yes, follow up plan:  Loren Racer, [place] Tues[place]   [phone]

## 2016-06-18 ENCOUNTER — Encounter (HOSPITAL_COMMUNITY): Payer: Self-pay

## 2016-06-23 ENCOUNTER — Other Ambulatory Visit: Payer: Self-pay | Admitting: Pharmacist

## 2016-06-25 NOTE — Progress Notes (Signed)
[facility] Hematology/Oncology Progress Note   Name: Ellen Hunt      MRN: [id number]    Date: 06/26/2016 Time:4:25 PM   REFERRING PHYSICIAN:  Milton Ferguson, MD (ED physcian)   DIAGNOSIS:  Ovarian Carcinoma    Ovarian cancer ([place])   04/29/2016 Imaging    CT abd/pelvis- Extensive omental caking as well as moderate amount of ascites within the abdomen most compatible with peritoneal metastatic disease, of unknown primary. This may potentially be ovarian or a GI in etiology.      04/30/2016 Tumor Marker    CA 125- 7149.0 (H)      05/01/2016 Procedure    US paracentesis- Successful ultrasound-guided paracentesis yielding 1.8 liters of peritoneal fluid.      05/01/2016 Imaging    US pelvis- Both transabdominal and transvaginal sonography are significantly limited by large patient habitus and ascites. Neither uterus or ovaries were visualized on this exam.      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      05/08/2016 Imaging    CT chest- No evidence of metastatic disease in the chest. Peritoneal/omental disease with abdominal ascites in the upper abdomen, incompletely visualized.       05/13/2016 Procedure    Placement of single lumen port a cath via right internal jugular vein. The catheter tip lies at the cavoatrial junction. A power injectable port a cath was placed and is ready for immediate use.      05/15/2016 Procedure    US Paracentesis- 3400 ml yellow colored ascites removed      05/15/2016 -  Chemotherapy    Carboplatin/Paclitaxel every 21 days        HISTORY OF PRESENT ILLNESS:   Ellen Hunt is a 65 y.o. female with a diagnosis of ovarian carcinoma. She presents today for ongoing follow-up. She has started chemotherapy with Carboplatin/Taxol.  Mrs. Nordahl returns to the [place] today accompanied by her husband. She  notes she is back down to her "pre-sick" weight, from 247 lbs to 218lbs. Her belly is softer. She is able to eat.   She has been using pain medication fairly regularly but mostly for bilateral hip/butt and R leg pain. Her post "shingles" pain is getting much better.   Her blood sugars are running around 90-120. She is still wearing her insulin pump but has noted using/requiring less insulin.   She presents today for cycle #3 of carboplatin/taxol. Mood is improved. She is not quite as overwhelmed. Genetics testing was the most difficult.   Review of Systems  Constitutional: Negative for chills, fever, malaise/fatigue and weight loss.  HENT: Negative.   Eyes: Negative.   Respiratory: Positive for shortness of breath. Negative for cough, sputum production and wheezing.   Cardiovascular: Positive for leg swelling (B/L).  Gastrointestinal: Positive for abdominal pain and diarrhea (chronic). Negative for nausea and vomiting.  Genitourinary: Negative.   Musculoskeletal: Negative.   Skin: Negative.   Neurological: Negative.   Endo/Heme/Allergies: Negative.   Psychiatric/Behavioral: Negative.    14 point review of systems was performed and is negative except as detailed under history of present illness and above   PAST MEDICAL HISTORY:   Past Medical History:  Diagnosis Date  . Abnormal CT of the abdomen 04/30/2016  . Diabetes mellitus without complication ([place])   . Family history of breast cancer   . Hyperlipidemia   .  Hypertension   . Low serum vitamin D   . Ovarian cancer ([place]) 05/09/2016    ALLERGIES: Allergies  Allergen Reactions  . Avandia [Rosiglitazone] Other (See Comments)    Legs swelled  . Micronase [Glyburide] Swelling  . Actos [Pioglitazone] Other (See Comments)    Edema / leg swelling      MEDICATIONS: I have reviewed the patient's current medications.    Current Outpatient Prescriptions on File Prior to Visit  Medication Sig Dispense Refill  . aspirin 81 MG  tablet Take 1 tablet (81 mg total) by mouth daily. (Patient taking differently: Take 81 mg by mouth every evening. ) 30 tablet   . atorvastatin (LIPITOR) 40 MG tablet TAKE 1 TABLET BY MOUTH EVERY DAY 90 tablet 0  . benazepril (LOTENSIN) 10 MG tablet TAKE 1 TABLET BY MOUTH EVERY DAY 90 tablet 0  . CARBOPLATIN IV Inject into the vein. Every 21 days    . cephALEXin (KEFLEX) 500 MG capsule Take 2 capsules (1,000 mg total) by mouth 2 (two) times daily. 20 capsule 0  . dexamethasone (DECADRON) 4 MG tablet The day before chemo take 5 tabs (49m total) in the am and 5 tabs (215mtotal) in the pm. The morning of chemo take 5 tabs (2034motal). 45 tablet 1  . diazepam (VALIUM) 5 MG tablet Take 0.5 tablets (2.5 mg total) by mouth every 4 (four) hours as needed (spasms). 30 tablet 1  . furosemide (LASIX) 20 MG tablet Take 1 tablet (20 mg total) by mouth daily. 30 tablet 5  . glucagon (GLUCAGON EMERGENCY) 1 MG injection Inject as directed as needed for low blood glucose if unresponsive. 1 each 0  . glucosamine-chondroitin 500-400 MG tablet Take 1 tablet by mouth 2 (two) times daily.    . gMarland Kitchenucose blood (ONE TOUCH ULTRA TEST) test strip USE TO CHECK BLOOD SUGAR UP TO 5 TIMES A DAY 450 each 2  . HYDROcodone-acetaminophen (NORCO) 5-325 MG tablet Take 1-2 tablets by mouth every 4 (four) hours as needed for moderate pain. 120 tablet 0  . lidocaine-prilocaine (EMLA) cream Apply a quarter size amount to port site 1 hour prior to chemo. Do not rub in. Cover with plastic wrap. 30 g 3  . metFORMIN (GLUCOPHAGE) 1000 MG tablet TAKE 1 TABLET BY MOUTH TWICE DAILY WITH A MEAL (Patient taking differently: Take 1,000 mg by mouth 2 (two) times daily with a meal. ) 180 tablet 1  . Multiple Vitamin (MULTIVITAMIN WITH MINERALS) TABS Take 1 tablet by mouth daily.    . NMarland KitchenVOLOG 100 UNIT/ML injection USE IN INSULIN PUMP AS DIRECTED (MAX DAILY DOSE IS 110 UNITS PER DAY) 40 mL 2  . omeprazole (PRILOSEC) 40 MG capsule Take 1 capsule (40 mg  total) by mouth daily. 30 capsule 3  . ondansetron (ZOFRAN) 8 MG tablet Take 1 tablet (8 mg total) by mouth every 8 (eight) hours as needed for nausea or vomiting. 18 tablet 2  . PACLitaxel (TAXOL IV) Inject into the vein. Every 21 days    . prochlorperazine (COMPAZINE) 10 MG tablet Take 1 tablet (10 mg total) by mouth every 6 (six) hours as needed for nausea or vomiting. 30 tablet 2  . valACYclovir (VALTREX) 500 MG tablet Take 1 tablet (500 mg total) by mouth 2 (two) times daily. Start after completion of 1000 mg po tid 60 tablet 3   Current Facility-Administered Medications on File Prior to Visit  Medication Dose Route Frequency Provider Last Rate Last Dose  . sodium chloride  flush (NS) 0.9 % injection 10 mL  10 mL Intracatheter PRN Patrici Ranks, MD         PAST SURGICAL HISTORY Past Surgical History:  Procedure Laterality Date  . CESAREAN SECTION    . LUMBAR FUSION  08/21/15   L3-L4 Dr. Timmothy Euler    FAMILY HISTORY: Family History  Problem Relation Age of Onset  . Lung cancer Mother     smoker; dx in her 64s  . Leukemia Father   . Diabetes Paternal Grandmother   . Heart attack Paternal Grandmother   . Diabetes Paternal Grandfather   . Breast cancer Paternal Aunt     dx in her 66s-30s  . Heart attack Maternal Grandfather   . Breast cancer Cousin     maternal first cousin    SOCIAL HISTORY:  reports that she has never smoked. She has never used smokeless tobacco. She reports that she does not drink alcohol or use drugs.  Social History   Social History  . Marital status: Married    Spouse name: Gershon Mussel  . Number of children: 2  . Years of education: N/A   Social History Main Topics  . Smoking status: Never Smoker  . Smokeless tobacco: Never Used  . Alcohol use No  . Drug use: No  . Sexual activity: Not Asked   Other Topics Concern  . None   Social History Narrative  . None    PERFORMANCE STATUS: The patient's performance status is 1 - Symptomatic but  completely ambulatory  PHYSICAL EXAM: Most Recent Vital Signs: Blood pressure (!) 120/50, pulse 84, temperature 97.6 F (36.4 C), temperature source Oral, resp. rate 16, weight 218 lb 14.4 oz (99.3 kg), SpO2 98 %. General appearance: alert, cooperative, appears stated age, no distress, moderately obese and accompanied by her husband. Head: Normocephalic, without obvious abnormality, atraumatic Throat: lips, mucosa, and tongue normal; teeth and gums normal Neck: no adenopathy and supple, symmetrical, trachea midline Lungs: clear to auscultation bilaterally and normal percussion bilaterally Heart: regular rate and rhythm, S1, S2 normal, no murmur, click, rub or gallop Abdomen: abnormal findings:  ascites, distended, obese and woody infiltration of lower abdomen Extremities: edema B/L LE edema, 2+ pitting Skin: Skin color, texture, turgor normal. No rashes or lesions Lymph nodes: Cervical, supraclavicular, and axillary nodes normal. Neurologic: Grossly normal  LABORATORY DATA:  No results found for this or any previous visit (from the past 48 hour(s)).    Results for BRENDALEE, MATTHIES (MRN [id number]) as of 06/26/2016 16:25  Ref. Range 04/30/2016 16:30 05/16/2016 10:01 06/05/2016 09:19 06/12/2016 09:56  CA 125 Latest Ref Range: 0.0 - 38.1 U/mL 7,149.0 (H) 11,113.0 (H) 12,359.0 (H) 12,650.0 (H)    Results for SAIDE, LANUZA (MRN [id number]) as of 06/26/2016 16:25  Ref. Range 06/26/2016 09:30  Sodium Latest Ref Range: 135 - 145 mmol/L 136  Potassium Latest Ref Range: 3.5 - 5.1 mmol/L 4.8  Chloride Latest Ref Range: 101 - 111 mmol/L 106  CO2 Latest Ref Range: 22 - 32 mmol/L 21 (L)  BUN Latest Ref Range: 6 - 20 mg/dL 35 (H)  Creatinine Latest Ref Range: 0.44 - 1.00 mg/dL 1.24 (H)  Calcium Latest Ref Range: 8.9 - 10.3 mg/dL 9.4  EGFR (Non-African Amer.) Latest Ref Range: >60 mL/min 45 (L)  EGFR (African American) Latest Ref Range: >60 mL/min 52 (L)  Glucose Latest Ref Range: 65 - 99 mg/dL 233 (H)    Anion gap Latest Ref Range: 5 - 15  9  Alkaline Phosphatase  Latest Ref Range: 38 - 126 U/L 70  Albumin Latest Ref Range: 3.5 - 5.0 g/dL 3.4 (L)  AST Latest Ref Range: 15 - 41 U/L 22  ALT Latest Ref Range: 14 - 54 U/L 18  Total Protein Latest Ref Range: 6.5 - 8.1 g/dL 6.7  Total Bilirubin Latest Ref Range: 0.3 - 1.2 mg/dL 0.5  WBC Latest Ref Range: 4.0 - 10.5 K/uL 7.9  RBC Latest Ref Range: 3.87 - 5.11 MIL/uL 3.01 (L)  Hemoglobin Latest Ref Range: 12.0 - 15.0 g/dL 9.0 (L)  HCT Latest Ref Range: 36.0 - 46.0 % 27.5 (L)  MCV Latest Ref Range: 78.0 - 100.0 fL 91.4  MCH Latest Ref Range: 26.0 - 34.0 pg 29.9  MCHC Latest Ref Range: 30.0 - 36.0 g/dL 32.7  RDW Latest Ref Range: 11.5 - 15.5 % 17.7 (H)  Platelets Latest Ref Range: 150 - 400 K/uL 327  Neutrophils Latest Units: % 92  Lymphocytes Latest Units: % 6  Monocytes Relative Latest Units: % 2  Eosinophil Latest Units: % 0  Basophil Latest Units: % 0  NEUT# Latest Ref Range: 1.7 - 7.7 K/uL 7.2  Lymphocyte # Latest Ref Range: 0.7 - 4.0 K/uL 0.5 (L)  Monocyte # Latest Ref Range: 0.1 - 1.0 K/uL 0.2  Eosinophils Absolute Latest Ref Range: 0.0 - 0.7 K/uL 0.0  Basophils Absolute Latest Ref Range: 0.0 - 0.1 K/uL 0.0  WBC Morphology Unknown INCREASED BANDS (...   RADIOGRAPHY: No results found.     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 She is here today for cycle #3. We will set her up for repeat imaging studies. We will refer her back to Dr[facility] Denman [facility] at [facility] post imaging studies. Based upon the results, I anticipate she will then proceed with surgical debulking.  I have encouraged her to continue with acyclovir prophylaxis for shingles throughout the course of the remainder of her therapy. We will plan on seeing her back after CT imaging and then technically after her surgery with  Dr. Denman George. Last paracentesis performed on 05/15/16.  CA-125 as of 8/10 had not significantly improved at all, pending today. Clinically she is better.    We reviewed her pain medication use. I have increased her to 10 mg hydrocodone from 5 to cut back on her tylenol use.  All questions were answered. The patient knows to call the clinic with any problems, questions or concerns. We can certainly see the patient much sooner if necessary.  This document serves as a record of services personally performed by Ancil Linsey, MD. It was created on her behalf by Arlyce Harman, a trained medical scribe. The creation of this record is based on the scribe's personal observations and the provider's statements to them. This document has been checked and approved by the attending provider.  I have reviewed the above documentation for accuracy and completeness and I agree with the above.  This note is electronically Newman Nip, MD  06/26/2016 4:25 PM

## 2016-06-26 ENCOUNTER — Encounter (HOSPITAL_BASED_OUTPATIENT_CLINIC_OR_DEPARTMENT_OTHER): Payer: BC Managed Care – PPO | Admitting: Hematology & Oncology

## 2016-06-26 ENCOUNTER — Encounter (HOSPITAL_COMMUNITY): Payer: Self-pay | Admitting: Hematology & Oncology

## 2016-06-26 ENCOUNTER — Encounter (HOSPITAL_BASED_OUTPATIENT_CLINIC_OR_DEPARTMENT_OTHER): Payer: BC Managed Care – PPO

## 2016-06-26 VITALS — BP 120/50 | HR 84 | Temp 97.6°F | Resp 16 | Wt 218.9 lb

## 2016-06-26 VITALS — BP 138/60 | HR 73 | Temp 97.7°F | Resp 16

## 2016-06-26 DIAGNOSIS — M5430 Sciatica, unspecified side: Secondary | ICD-10-CM | POA: Diagnosis not present

## 2016-06-26 DIAGNOSIS — Z5111 Encounter for antineoplastic chemotherapy: Secondary | ICD-10-CM

## 2016-06-26 DIAGNOSIS — C569 Malignant neoplasm of unspecified ovary: Secondary | ICD-10-CM

## 2016-06-26 DIAGNOSIS — B029 Zoster without complications: Secondary | ICD-10-CM

## 2016-06-26 DIAGNOSIS — R935 Abnormal findings on diagnostic imaging of other abdominal regions, including retroperitoneum: Secondary | ICD-10-CM | POA: Diagnosis not present

## 2016-06-26 LAB — CBC WITH DIFFERENTIAL/PLATELET
BASOS PCT: 0 %
Basophils Absolute: 0 10*3/uL (ref 0.0–0.1)
Eosinophils Absolute: 0 10*3/uL (ref 0.0–0.7)
Eosinophils Relative: 0 %
HEMATOCRIT: 27.5 % — AB (ref 36.0–46.0)
HEMOGLOBIN: 9 g/dL — AB (ref 12.0–15.0)
LYMPHS PCT: 6 %
Lymphs Abs: 0.5 10*3/uL — ABNORMAL LOW (ref 0.7–4.0)
MCH: 29.9 pg (ref 26.0–34.0)
MCHC: 32.7 g/dL (ref 30.0–36.0)
MCV: 91.4 fL (ref 78.0–100.0)
MONOS PCT: 2 %
Monocytes Absolute: 0.2 10*3/uL (ref 0.1–1.0)
NEUTROS ABS: 7.2 10*3/uL (ref 1.7–7.7)
Neutrophils Relative %: 92 %
Platelets: 327 10*3/uL (ref 150–400)
RBC: 3.01 MIL/uL — ABNORMAL LOW (ref 3.87–5.11)
RDW: 17.7 % — ABNORMAL HIGH (ref 11.5–15.5)
WBC MORPHOLOGY: INCREASED
WBC: 7.9 10*3/uL (ref 4.0–10.5)

## 2016-06-26 LAB — COMPREHENSIVE METABOLIC PANEL
ALBUMIN: 3.4 g/dL — AB (ref 3.5–5.0)
ALK PHOS: 70 U/L (ref 38–126)
ALT: 18 U/L (ref 14–54)
ANION GAP: 9 (ref 5–15)
AST: 22 U/L (ref 15–41)
BILIRUBIN TOTAL: 0.5 mg/dL (ref 0.3–1.2)
BUN: 35 mg/dL — AB (ref 6–20)
CALCIUM: 9.4 mg/dL (ref 8.9–10.3)
CO2: 21 mmol/L — ABNORMAL LOW (ref 22–32)
CREATININE: 1.24 mg/dL — AB (ref 0.44–1.00)
Chloride: 106 mmol/L (ref 101–111)
GFR calc non Af Amer: 45 mL/min — ABNORMAL LOW (ref >60)
GFR, EST AFRICAN AMERICAN: 52 mL/min — AB (ref >60)
Glucose, Bld: 233 mg/dL — ABNORMAL HIGH (ref 65–99)
Potassium: 4.8 mmol/L (ref 3.5–5.1)
Sodium: 136 mmol/L (ref 135–145)
TOTAL PROTEIN: 6.7 g/dL (ref 6.5–8.1)

## 2016-06-26 MED ORDER — PALONOSETRON HCL INJECTION 0.25 MG/5ML
0.2500 mg | Freq: Once | INTRAVENOUS | Status: AC
  Administered 2016-06-26: 0.25 mg via INTRAVENOUS

## 2016-06-26 MED ORDER — DIPHENHYDRAMINE HCL 50 MG/ML IJ SOLN
50.0000 mg | Freq: Once | INTRAMUSCULAR | Status: AC
  Administered 2016-06-26: 50 mg via INTRAVENOUS

## 2016-06-26 MED ORDER — FAMOTIDINE IN NACL 20-0.9 MG/50ML-% IV SOLN
20.0000 mg | Freq: Once | INTRAVENOUS | Status: AC
  Administered 2016-06-26: 20 mg via INTRAVENOUS

## 2016-06-26 MED ORDER — DIPHENHYDRAMINE HCL 50 MG/ML IJ SOLN
INTRAMUSCULAR | Status: AC
  Filled 2016-06-26: qty 1

## 2016-06-26 MED ORDER — PACLITAXEL CHEMO INJECTION 300 MG/50ML: 175.0000 mg/m2 | Freq: Once | INTRAVENOUS | Status: DC

## 2016-06-26 MED ORDER — SODIUM CHLORIDE 0.9 % IV SOLN
Freq: Once | INTRAVENOUS | Status: AC
  Administered 2016-06-26: 10:00:00 via INTRAVENOUS

## 2016-06-26 MED ORDER — HEPARIN SOD (PORK) LOCK FLUSH 100 UNIT/ML IV SOLN
500.0000 [IU] | Freq: Once | INTRAVENOUS | Status: AC | PRN
  Administered 2016-06-26: 500 [IU]

## 2016-06-26 MED ORDER — SODIUM CHLORIDE 0.9% FLUSH: 10.0000 mL | INTRAVENOUS | Status: DC | PRN

## 2016-06-26 MED ORDER — PALONOSETRON HCL INJECTION 0.25 MG/5ML
INTRAVENOUS | Status: AC
  Filled 2016-06-26: qty 5

## 2016-06-26 MED ORDER — SODIUM CHLORIDE 0.9 % IV SOLN: 750.0000 mg | Freq: Once | INTRAVENOUS | Status: DC

## 2016-06-26 MED ORDER — FAMOTIDINE IN NACL 20-0.9 MG/50ML-% IV SOLN
INTRAVENOUS | Status: AC
  Filled 2016-06-26: qty 50

## 2016-06-26 MED ORDER — PACLITAXEL CHEMO INJECTION 300 MG/50ML
175.0000 mg/m2 | Freq: Once | INTRAVENOUS | Status: AC
  Administered 2016-06-26: 372 mg via INTRAVENOUS
  Filled 2016-06-26: qty 62

## 2016-06-26 MED ORDER — HYDROCODONE-ACETAMINOPHEN 10-325 MG PO TABS: 1.0000 | ORAL_TABLET | ORAL | 0 refills | Status: DC | PRN

## 2016-06-26 MED ORDER — SODIUM CHLORIDE 0.9 % IV SOLN
586.8000 mg | Freq: Once | INTRAVENOUS | Status: AC
  Administered 2016-06-26: 590 mg via INTRAVENOUS
  Filled 2016-06-26: qty 59

## 2016-06-26 MED ORDER — SODIUM CHLORIDE 0.9 % IV SOLN
20.0000 mg | Freq: Once | INTRAVENOUS | Status: AC
  Administered 2016-06-26: 20 mg via INTRAVENOUS
  Filled 2016-06-26: qty 2

## 2016-06-26 NOTE — Patient Instructions (Addendum)
[place] at [facility] Discharge Instructions  RECOMMENDATIONS MADE BY THE CONSULTANT AND ANY TEST RESULTS WILL BE SENT TO YOUR REFERRING PHYSICIAN.  You saw Dr. Whitney Muse today. Dr. Whitney Muse increased hydrocodone to 10/325. We will move your CT scans to 07/08/16 Keep follow up with Dr. Denman George. Follow up with [name] or Dr. Whitney Muse 1 week after your appointment with Dr. Denman George.  Thank you for choosing [place] at [facility] to provide your oncology and hematology care.  To afford each patient quality time with our provider, please arrive at least 15 minutes before your scheduled appointment time.   Beginning January 23rd 2017 lab work for the [facility] will be done in the  Main lab at [facility] on 1st floor. If you have a lab appointment with the [place] please come in thru the  Main Entrance and check in at the main information desk  You need to re-schedule your appointment should you arrive 10 or more minutes late.  We strive to give you quality time with our providers, and arriving late affects you and other patients whose appointments are after yours.  Also, if you no show three or more times for appointments you may be dismissed from the clinic at the providers discretion.     Again, thank you for choosing [facility].  Our hope is that these requests will decrease the amount of time that you wait before being seen by our physicians.       _____________________________________________________________  Should you have questions after your visit to [facility], please contact our office at [phone] between the hours of 8:30 a.m. and 4:30 p.m.  Voicemails left after 4:30 p.m. will not be returned until the following business day.  For prescription refill requests, have your pharmacy contact our office.         Resources For Cancer Patients and their Caregivers ? American Cancer Society: Can assist  with transportation, wigs, general needs, runs Look Good Feel Better.        680-643-1710 ? Cancer Care: Provides financial assistance, online support groups, medication/co-pay assistance.  1-800-813-HOPE (561) 574-4915) ? [place] Assists [place] cancer patients and their families through emotional , educational and financial support.  480 098 6912 ? [place] DSS Where to apply for food stamps, Medicaid and utility assistance. 740-753-5234 ? RCATS: Transportation to medical appointments. 825-639-0080 ? Social Security Administration: May apply for disability if have a Stage IV cancer. 705-252-6780 7601995565 ? [employer], Disability and Transit Services: Assists with nutrition, care and transit needs. [place] Support Programs: @10RELATIVEDAYS @ > Cancer Support Group  2nd Tuesday of the month 1pm-2pm, Journey Room  > Creative Journey  3rd Tuesday of the month 1130am-1pm, Journey Room  > Look Good Feel Better  1st Wednesday of the month 10am-12 noon, Journey Room (Call [place] to register [phone])

## 2016-06-26 NOTE — Progress Notes (Signed)
Kidney function and other labs discussed with Dr.  Muse and pharmacist prior to ordering chemotherapy.  Both MD and pharmacist are in agreement with current plan of care and to give chemotherapy as ordered.  Patient to receive additional bolus of normal saline for hydration and to promote kidney function while getting treatment today.  Patient and husband educated on adequate hydration and the rationale for this as well as the extra hydration that will be given today.  They verbalized understanding.  Patient ambulatory and stable upon discharge from clinic.

## 2016-06-26 NOTE — Patient Instructions (Signed)
[facility] Discharge Instructions for Patients Receiving Chemotherapy   Beginning January 23rd 2017 lab work for the [employer] will be done in the  Main lab at [facility] on 1st floor. If you have a lab appointment with the [facility] please come in thru the  Main Entrance and check in at the main information desk   Today you received the following chemotherapy agents: Taxol and carbo.   We gave you extra IV fluids today for hydration because your kidney function was declined slightly.  Please try to drink more water when at home to help flush the chemotherapy from your system to protect your kidney function.     If you develop nausea and vomiting, or diarrhea that is not controlled by your medication, call the clinic.  The clinic phone number is [phone]. Office hours are Monday-Friday 8:30am-5:00pm.  BELOW ARE SYMPTOMS THAT SHOULD BE REPORTED IMMEDIATELY:  *FEVER GREATER THAN 101.0 F  *CHILLS WITH OR WITHOUT FEVER  NAUSEA AND VOMITING THAT IS NOT CONTROLLED WITH YOUR NAUSEA MEDICATION  *UNUSUAL SHORTNESS OF BREATH  *UNUSUAL BRUISING OR BLEEDING  TENDERNESS IN MOUTH AND THROAT WITH OR WITHOUT PRESENCE OF ULCERS  *URINARY PROBLEMS  *BOWEL PROBLEMS  UNUSUAL RASH Items with * indicate a potential emergency and should be followed up as soon as possible. If you have an emergency after office hours please contact your primary care physician or go to the nearest emergency department.  Please call the clinic during office hours if you have any questions or concerns.   You may also contact the Patient Navigator at [phone] should you have any questions or need assistance in obtaining follow up care.      Resources For Cancer Patients and their Caregivers ? American Cancer Society: Can assist with transportation, wigs, general needs, runs Look Good Feel Better.        719-103-4870 ? Cancer Care: Provides financial assistance, online  support groups, medication/co-pay assistance.  1-800-813-HOPE (206)850-0658) ? [place] Assists [place] cancer patients and their families through emotional , educational and financial support.  901-602-2704 ? [place] DSS Where to apply for food stamps, Medicaid and utility assistance. 437-524-5529 ? RCATS: Transportation to medical appointments. 802-632-5125 ? Social Security Administration: May apply for disability if have a Stage IV cancer. ([phone] 934-303-1170 ? [employer], Disability and Transit Services: Assists with nutrition, care and transit needs. (313)345-4659

## 2016-06-27 ENCOUNTER — Telehealth: Payer: Self-pay | Admitting: Genetic Counselor

## 2016-06-27 ENCOUNTER — Encounter: Payer: Self-pay | Admitting: Genetic Counselor

## 2016-06-27 DIAGNOSIS — Z1379 Encounter for other screening for genetic and chromosomal anomalies: Secondary | ICD-10-CM | POA: Insufficient documentation

## 2016-06-27 LAB — CA 125: CA 125: 8811 U/mL — ABNORMAL HIGH (ref 0.0–38.1)

## 2016-06-27 NOTE — Telephone Encounter (Signed)
Revealed negative genetic testing.  Discussed that there does not seem to be a hereditary cause to her cancer.  Explained that there could be another gene that we are not testing at this time, or a variant that we are not able to detect at this time.  Please keep in contact with [place] in case there is additional testing we would want to do in the future.

## 2016-07-01 ENCOUNTER — Ambulatory Visit: Payer: Self-pay | Admitting: Genetic Counselor

## 2016-07-01 DIAGNOSIS — Z1379 Encounter for other screening for genetic and chromosomal anomalies: Secondary | ICD-10-CM

## 2016-07-01 DIAGNOSIS — C569 Malignant neoplasm of unspecified ovary: Secondary | ICD-10-CM

## 2016-07-01 NOTE — Progress Notes (Signed)
HPI: Ms. Toppin was previously seen in the [place] clinic due to a personal history of ovarian cancer and family history of breast cancer and concerns regarding a hereditary predisposition to cancer. Please refer to our prior cancer genetics clinic note for more information regarding Ms. Diguglielmo's medical, social and family histories, and our assessment and recommendations, at the time. Ms. Steury recent genetic test results were disclosed to her, as were recommendations warranted by these results. These results and recommendations are discussed in more detail below.  FAMILY HISTORY:  We obtained a detailed, 4-generation family history.  Significant diagnoses are listed below: Family History  Problem Relation Age of Onset  . Lung cancer Mother     smoker; dx in her 35s  . Leukemia Father   . Diabetes Paternal Grandmother   . Heart attack Paternal Grandmother   . Diabetes Paternal Grandfather   . Breast cancer Paternal Aunt     dx in her 43s-30s  . Leukemia Paternal Uncle   . Heart attack Maternal Grandfather   . Breast cancer Cousin     maternal first cousin    The patient has two children who are cancer free.  She has a maternal half sister and a full brother, both who are cancer free.  Her parents are both deceased.  Her mother was a smoker and died of lung cancer.  Her mother had two sisters who died of non cancer related issues.  One sister had a daughter with breast cancer.  The patient's maternal grandmother died at 58 and her grandfather died of a heart attack.  The patient's father died of leukemia at 75.  He had a brother and sister.  The sister developed breast cancer during the pregnancy of her 6th child.  She died soon after giving birth.  There is no other reported cancer history.  Patient's maternal ancestors are of [place] descent, and paternal ancestors are of [place] descent. There is no reported Ashkenazi Jewish ancestry. There is no known  consanguinity.  GENETIC TEST RESULTS: At the time of Ms. Rottman visit, we recommended she pursue genetic testing of the Breast/Ovarian cancer gene panel. The Breast/Ovarian gene panel offered by [facility] includes sequencing and rearrangement analysis for the following 20 genes:  ATM, BARD1, BRCA1, BRCA2, BRIP1, CDH1, CHEK2, EPCAM, FANCC, MLH1, MSH2, MSH6, NBN, PALB2, PMS2, PTEN, RAD51C, RAD51D, TP53, and XRCC2.   The report date is June 25, 2016.  Genetic testing was normal, and did not reveal a deleterious mutation in these genes. The test report has been scanned into EPIC and is located under the Molecular Pathology section of the Results Review tab.   We discussed with Ms. Swanner that since the current genetic testing is not perfect, it is possible there may be a gene mutation in one of these genes that current testing cannot detect, but that chance is small. We also discussed, that it is possible that another gene that has not yet been discovered, or that we have not yet tested, is responsible for the cancer diagnoses in the family, and it is, therefore, important to remain in touch with cancer genetics in the future so that we can continue to offer Ms. Swindle the most up to date genetic testing.   CANCER SCREENING RECOMMENDATIONS: This result is reassuring and indicates that Ms. Jafari likely does not have an increased risk for a future cancer due to a mutation in one of these genes. This normal test also suggests that Ms. Bebee's cancer  was most likely not due to an inherited predisposition associated with one of these genes.  Most cancers happen by chance and this negative test suggests that her cancer falls into this category.  We, therefore, recommended she continue to follow the cancer management and screening guidelines provided by her oncology and primary healthcare provider.   RECOMMENDATIONS FOR FAMILY MEMBERS: Women in this family might be at some increased risk of developing cancer, over the  general population risk, simply due to the family history of cancer. We recommended women in this family have a yearly mammogram beginning at age 50, or 59 years younger than the earliest onset of cancer, an an annual clinical breast exam, and perform monthly breast self-exams. Women in this family should also have a gynecological exam as recommended by their primary provider. All family members should have a colonoscopy by age 40.  FOLLOW-UP: Lastly, we discussed with Ms. Peggs that cancer genetics is a rapidly advancing field and it is possible that new genetic tests will be appropriate for her and/or her family members in the future. We encouraged her to remain in contact with cancer genetics on an annual basis so we can update her personal and family histories and let her know of advances in cancer genetics that may benefit this family.   Our contact number was provided. Ms. Pottenger questions were answered to her satisfaction, and she knows she is welcome to call us at anytime with additional questions or concerns.   [name], MS, [facility] Certified Genetic Counselor [email or web address]

## 2016-07-03 ENCOUNTER — Ambulatory Visit (HOSPITAL_COMMUNITY): Payer: BC Managed Care – PPO

## 2016-07-08 ENCOUNTER — Ambulatory Visit (HOSPITAL_COMMUNITY)
Admission: RE | Admit: 2016-07-08 | Discharge: 2016-07-08 | Disposition: A | Discharge status: Home / Self Care | Payer: BC Managed Care – PPO | Source: Ambulatory Visit | Attending: Oncology | Admitting: Oncology

## 2016-07-08 DIAGNOSIS — I2510 Atherosclerotic heart disease of native coronary artery without angina pectoris: Secondary | ICD-10-CM | POA: Insufficient documentation

## 2016-07-08 DIAGNOSIS — C786 Secondary malignant neoplasm of retroperitoneum and peritoneum: Secondary | ICD-10-CM | POA: Insufficient documentation

## 2016-07-08 DIAGNOSIS — N200 Calculus of kidney: Secondary | ICD-10-CM | POA: Diagnosis not present

## 2016-07-08 DIAGNOSIS — C569 Malignant neoplasm of unspecified ovary: Secondary | ICD-10-CM | POA: Insufficient documentation

## 2016-07-08 DIAGNOSIS — R188 Other ascites: Secondary | ICD-10-CM | POA: Diagnosis not present

## 2016-07-08 DIAGNOSIS — I700 Atherosclerosis of aorta: Secondary | ICD-10-CM | POA: Diagnosis not present

## 2016-07-08 MED ORDER — IOPAMIDOL (ISOVUE-300) INJECTION 61%
80.0000 mL | Freq: Once | INTRAVENOUS | Status: AC | PRN
  Administered 2016-07-08: 80 mL via INTRAVENOUS

## 2016-07-09 ENCOUNTER — Ambulatory Visit: Payer: BC Managed Care – PPO | Attending: Gynecologic Oncology | Admitting: Gynecologic Oncology

## 2016-07-09 ENCOUNTER — Encounter: Payer: Self-pay | Admitting: Gynecologic Oncology

## 2016-07-09 VITALS — BP 138/62 | HR 96 | Temp 97.8°F | Resp 18 | Ht 64.0 in | Wt 212.9 lb

## 2016-07-09 DIAGNOSIS — E119 Type 2 diabetes mellitus without complications: Secondary | ICD-10-CM | POA: Diagnosis not present

## 2016-07-09 DIAGNOSIS — Z6836 Body mass index (BMI) 36.0-36.9, adult: Secondary | ICD-10-CM | POA: Diagnosis not present

## 2016-07-09 DIAGNOSIS — Z981 Arthrodesis status: Secondary | ICD-10-CM | POA: Diagnosis not present

## 2016-07-09 DIAGNOSIS — Z7982 Long term (current) use of aspirin: Secondary | ICD-10-CM | POA: Diagnosis not present

## 2016-07-09 DIAGNOSIS — I10 Essential (primary) hypertension: Secondary | ICD-10-CM | POA: Diagnosis not present

## 2016-07-09 DIAGNOSIS — Z7189 Other specified counseling: Secondary | ICD-10-CM | POA: Diagnosis not present

## 2016-07-09 DIAGNOSIS — C569 Malignant neoplasm of unspecified ovary: Secondary | ICD-10-CM | POA: Diagnosis not present

## 2016-07-09 DIAGNOSIS — Z888 Allergy status to other drugs, medicaments and biological substances status: Secondary | ICD-10-CM | POA: Diagnosis not present

## 2016-07-09 DIAGNOSIS — E785 Hyperlipidemia, unspecified: Secondary | ICD-10-CM | POA: Insufficient documentation

## 2016-07-09 DIAGNOSIS — Z794 Long term (current) use of insulin: Secondary | ICD-10-CM | POA: Insufficient documentation

## 2016-07-09 DIAGNOSIS — Z803 Family history of malignant neoplasm of breast: Secondary | ICD-10-CM | POA: Diagnosis not present

## 2016-07-09 NOTE — Patient Instructions (Signed)
Plan to proceed with three more cycles of chemotherapy then repeat imaging and follow up with Dr. Denman George after for consideration of surgery.  Please call for any questions or concerns.

## 2016-07-11 ENCOUNTER — Encounter: Payer: Self-pay | Admitting: Gynecologic Oncology

## 2016-07-11 NOTE — Progress Notes (Signed)
Consult Note: Gyn-Onc  Consult was requested by Dr. Whitney Muse for the evaluation of Ellen Hunt 65 y.o. female  CC:  Chief Complaint  Patient presents with  . Ovarian Cancer    Pre-op discussion    Assessment/Plan:  Ms. Ellen Hunt  is a 65 y.o.  year old with stage IIIC ovarian/primary peritoneal/fallopian tube cancer.  I reviewed her CT images from 07/04/16 with the patient. She has had minimal response on imaging. There is resolution of ascites, but upper abdominal omental disease remains stable. Her CA 125 has also minimally responded.   I am recommending an additional 3 cycles of carboplatin and paclitaxel prior to reassessment with reimaging. At that time, if she has had a more substantial response we will consider an interval cytoreductive effort.  I discussed with the patient and her husband that the modest response on imaging is concerning for a less responsive tumor and a poorer prognosis. However, since there was some response to therapy (particularly for the last cycle) I think it is reasonable to continue with the current regimen for an additional 3 cycles.  We will coordinate with Dr Donald Pore office to see her back after cycle #6.  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  She has a history of feeling bloated and extended since June 1st, 2017. She was seen in the ED on 04/30/16 and a CT scan of the abdomen and pelvis was performed it revealed moderate volume ascites, masslike expansion of the uterine fundus, no adnexal masses seen. There was extensive omental caking compatible with peritoneal metastatic disease. No pathologic adenopathy was identified. 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 The patient was seen and evaluated by  Dr. Whitney Muse with a plan in place for chemotherapy when appropriate.   She is morbidly obese with a weight of 247lbs. She has poorly controlled DM (last HbA1C was >9%) though she has more recently been on tighter control of her glucose with metformin and insulin.  Interval History:  She was diagnosed with shingles above her left eye immediately prior to starting chemotherapy. Between 05/15/16 (day 1 cycle 1) and 06/26/16 (day 1 cycle 3) she has received 3 doses of neoadjuvant carboplatin and paclitaxel. Her CA 125 was 7149 on 05/01/16 and increased to 11,113 on 05/16/16 on day of chemotherapy initiation. It was increased to 12,359 prior to cycle 2 and decreased to 8,811 for day 1 of cycle 3.   She feels much better than the time of diagnosis. She is eating better but has lost many pounds of ascitic fluid.  The shingles lesions resolved.  Current Meds:  Outpatient Encounter Prescriptions as of 07/09/2016  Medication Sig  . aspirin 81 MG tablet Take 1 tablet (81 mg total) by mouth daily. (Patient taking differently: Take 81 mg by mouth every evening. )  . atorvastatin (LIPITOR) 40 MG tablet TAKE 1 TABLET BY MOUTH EVERY DAY  . benazepril (LOTENSIN) 10 MG tablet TAKE 1 TABLET BY MOUTH EVERY DAY  . CARBOPLATIN IV Inject into the vein. Every 21 days  . cephALEXin (KEFLEX) 500 MG capsule Take 2 capsules (1,000 mg total) by mouth 2 (two) times daily.  Marland Kitchen dexamethasone (DECADRON) 4 MG tablet The day before chemo take 5 tabs (20mg  total) in the am and 5 tabs (20mg  total) in the pm.  The morning of chemo take 5 tabs (20mg  total).  . furosemide (LASIX) 20 MG tablet Take 1 tablet (20 mg total) by mouth daily.  Marland Kitchen glucosamine-chondroitin 500-400 MG tablet Take 1 tablet by mouth 2 (two) times daily.  Marland Kitchen glucose blood (ONE TOUCH ULTRA TEST) test strip USE TO CHECK BLOOD SUGAR UP TO 5 TIMES A DAY  . lidocaine-prilocaine (EMLA) cream Apply a quarter size amount to port site 1 hour prior to chemo. Do not rub in. Cover with  plastic wrap.  . metFORMIN (GLUCOPHAGE) 1000 MG tablet TAKE 1 TABLET BY MOUTH TWICE DAILY WITH A MEAL (Patient taking differently: Take 1,000 mg by mouth 2 (two) times daily with a meal. )  . Multiple Vitamin (MULTIVITAMIN WITH MINERALS) TABS Take 1 tablet by mouth daily.  Marland Kitchen NOVOLOG 100 UNIT/ML injection USE IN INSULIN PUMP AS DIRECTED (MAX DAILY DOSE IS 110 UNITS PER DAY)  . omeprazole (PRILOSEC) 40 MG capsule Take 1 capsule (40 mg total) by mouth daily.  . ondansetron (ZOFRAN) 8 MG tablet Take 1 tablet (8 mg total) by mouth every 8 (eight) hours as needed for nausea or vomiting.  Marland Kitchen PACLitaxel (TAXOL IV) Inject into the vein. Every 21 days  . valACYclovir (VALTREX) 500 MG tablet Take 1 tablet (500 mg total) by mouth 2 (two) times daily. Start after completion of 1000 mg po tid  . diazepam (VALIUM) 5 MG tablet Take 0.5 tablets (2.5 mg total) by mouth every 4 (four) hours as needed (spasms). (Patient not taking: Reported on 07/09/2016)  . glucagon (GLUCAGON EMERGENCY) 1 MG injection Inject as directed as needed for low blood glucose if unresponsive. (Patient not taking: Reported on 07/09/2016)  . HYDROcodone-acetaminophen (NORCO) 10-325 MG tablet Take 1 tablet by mouth every 4 (four) hours as needed. (Patient not taking: Reported on 07/09/2016)  . HYDROcodone-acetaminophen (NORCO) 5-325 MG tablet Take 1-2 tablets by mouth every 4 (four) hours as needed for moderate pain. (Patient not taking: Reported on 07/09/2016)  . prochlorperazine (COMPAZINE) 10 MG tablet Take 1 tablet (10 mg total) by mouth every 6 (six) hours as needed for nausea or vomiting. (Patient not taking: Reported on 07/09/2016)   No facility-administered encounter medications on file as of 07/09/2016.     Allergy:  Allergies  Allergen Reactions  . Avandia [Rosiglitazone] Other (See Comments)    Legs swelled  . Micronase [Glyburide] Swelling  . Actos [Pioglitazone] Other (See Comments)    Edema / leg swelling    Social Hx:   Social  History   Social History  . Marital status: Married    Spouse name: Gershon Mussel  . Number of children: 2  . Years of education: N/A   Occupational History  . Not on file.   Social History Main Topics  . Smoking status: Never Smoker  . Smokeless tobacco: Never Used  . Alcohol use No  . Drug use: No  . Sexual activity: Not on file   Other Topics Concern  . Not on file   Social History Narrative  . No narrative on file    Past Surgical Hx:  Past Surgical History:  Procedure Laterality Date  . CESAREAN SECTION    . LUMBAR FUSION  08/21/15   L3-L4 Dr. Timmothy Euler    Past Medical Hx:  Past Medical History:  Diagnosis Date  . Abnormal CT of the abdomen 04/30/2016  . Diabetes mellitus without complication ([place])   . Family history of breast cancer   . Hyperlipidemia   . Hypertension   .  Low serum vitamin D   . Ovarian cancer ([place]) 05/09/2016    Past Gynecological History:  No LMP recorded. Patient is postmenopausal.  Family Hx:  Family History  Problem Relation Age of Onset  . Lung cancer Mother     smoker; dx in her 3s  . Leukemia Father   . Diabetes Paternal Grandmother   . Heart attack Paternal Grandmother   . Diabetes Paternal Grandfather   . Breast cancer Paternal Aunt     dx in her 15s-30s  . Leukemia Paternal Uncle   . Heart attack Maternal Grandfather   . Breast cancer Cousin     maternal first cousin    Review of Systems:  Constitutional  Feels fatigued and bloated  ENT Normal appearing ears and nares bilaterally Skin/Breast  + right temporal and eyebrow vesicular rash Cardiovascular  No chest pain, shortness of breath, or edema  Pulmonary  No cough or wheeze.  Gastro Intestinal  + bloating, nausea, early satiety Genito Urinary  No frequency, urgency, dysuria, no bleeding Musculo Skeletal  No myalgia, arthralgia, joint swelling or pain  Neurologic  No weakness, numbness, change in gait,  Psychology  No depression, anxiety, insomnia.    Vitals:  Blood pressure 138/62, pulse 96, temperature 97.8 F (36.6 C), temperature source Oral, resp. rate 18, height 5\' 4"  (1.626 m), weight 212 lb 14.4 oz (96.6 kg), SpO2 98 %.  Physical Exam: WD in NAD Neck  Supple NROM, without any enlargements.  Lymph Node Survey No cervical supraclavicular or inguinal adenopathy Cardiovascular  Pulse normal rate, regularity and rhythm. S1 and S2 normal.  Lungs  Clear to auscultation bilateraly, without wheezes/crackles/rhonchi. Good air movement.  Skin  No rash/lesions/breakdown  Psychiatry  Alert and oriented to person, place, and time  Abdomen  Normoactive bowel sounds, abdomen soft, non-tender and obese without evidence of hernia. Vertical midline incision from prior cesarean. Back No CVA tenderness Genito Urinary  Vulva/vagina: Normal external female genitalia.  No lesions. No discharge or bleeding.  Bladder/urethra:  No lesions or masses, well supported bladder  Vagina: normal  Cervix: Normal appearing, no lesions.  Uterus: Small, mobile, no parametrial involvement or nodularity.  Adnexa: no palpable masses. Rectal  Good tone, no masses no cul de sac nodularity.  Extremities  No bilateral cyanosis, clubbing or edema.   Donaciano Eva, MD  07/11/2016, 5:35 PM

## 2016-07-17 ENCOUNTER — Encounter (HOSPITAL_COMMUNITY): Payer: Self-pay | Admitting: Oncology

## 2016-07-17 ENCOUNTER — Encounter (HOSPITAL_COMMUNITY): Payer: BC Managed Care – PPO | Attending: Oncology | Admitting: Oncology

## 2016-07-17 ENCOUNTER — Encounter (HOSPITAL_BASED_OUTPATIENT_CLINIC_OR_DEPARTMENT_OTHER): Payer: BC Managed Care – PPO

## 2016-07-17 ENCOUNTER — Telehealth: Payer: Self-pay | Admitting: Family Medicine

## 2016-07-17 ENCOUNTER — Ambulatory Visit (HOSPITAL_COMMUNITY): Payer: BC Managed Care – PPO

## 2016-07-17 VITALS — BP 136/53 | HR 100 | Temp 98.0°F | Resp 18 | Wt 216.6 lb

## 2016-07-17 VITALS — BP 121/55 | HR 86 | Temp 97.6°F | Resp 18

## 2016-07-17 DIAGNOSIS — R935 Abnormal findings on diagnostic imaging of other abdominal regions, including retroperitoneum: Secondary | ICD-10-CM | POA: Insufficient documentation

## 2016-07-17 DIAGNOSIS — Z23 Encounter for immunization: Secondary | ICD-10-CM | POA: Diagnosis not present

## 2016-07-17 DIAGNOSIS — C569 Malignant neoplasm of unspecified ovary: Secondary | ICD-10-CM | POA: Diagnosis not present

## 2016-07-17 DIAGNOSIS — R180 Malignant ascites: Secondary | ICD-10-CM

## 2016-07-17 DIAGNOSIS — Z5111 Encounter for antineoplastic chemotherapy: Secondary | ICD-10-CM

## 2016-07-17 DIAGNOSIS — Z0000 Encounter for general adult medical examination without abnormal findings: Secondary | ICD-10-CM

## 2016-07-17 LAB — CBC WITH DIFFERENTIAL/PLATELET
BASOS PCT: 0 %
Basophils Absolute: 0 10*3/uL (ref 0.0–0.1)
EOS ABS: 0 10*3/uL (ref 0.0–0.7)
EOS PCT: 0 %
HCT: 28.9 % — ABNORMAL LOW (ref 36.0–46.0)
Hemoglobin: 9.5 g/dL — ABNORMAL LOW (ref 12.0–15.0)
LYMPHS ABS: 0.4 10*3/uL — AB (ref 0.7–4.0)
Lymphocytes Relative: 5 %
MCH: 30.8 pg (ref 26.0–34.0)
MCHC: 32.9 g/dL (ref 30.0–36.0)
MCV: 93.8 fL (ref 78.0–100.0)
MONOS PCT: 2 %
Monocytes Absolute: 0.2 10*3/uL (ref 0.1–1.0)
NEUTROS PCT: 93 %
Neutro Abs: 8 10*3/uL — ABNORMAL HIGH (ref 1.7–7.7)
PLATELETS: 196 10*3/uL (ref 150–400)
RBC: 3.08 MIL/uL — ABNORMAL LOW (ref 3.87–5.11)
RDW: 20 % — AB (ref 11.5–15.5)
WBC: 8.6 10*3/uL (ref 4.0–10.5)

## 2016-07-17 LAB — COMPREHENSIVE METABOLIC PANEL WITH GFR
ALT: 20 U/L (ref 14–54)
AST: 23 U/L (ref 15–41)
Albumin: 3.9 g/dL (ref 3.5–5.0)
Alkaline Phosphatase: 69 U/L (ref 38–126)
Anion gap: 13 (ref 5–15)
BUN: 30 mg/dL — ABNORMAL HIGH (ref 6–20)
CO2: 20 mmol/L — ABNORMAL LOW (ref 22–32)
Calcium: 9.7 mg/dL (ref 8.9–10.3)
Chloride: 101 mmol/L (ref 101–111)
Creatinine, Ser: 0.99 mg/dL (ref 0.44–1.00)
GFR calc Af Amer: >60 mL/min
GFR calc non Af Amer: 59 mL/min — ABNORMAL LOW
Glucose, Bld: 298 mg/dL — ABNORMAL HIGH (ref 65–99)
Potassium: 4.9 mmol/L (ref 3.5–5.1)
Sodium: 134 mmol/L — ABNORMAL LOW (ref 135–145)
Total Bilirubin: 0.4 mg/dL (ref 0.3–1.2)
Total Protein: 6.8 g/dL (ref 6.5–8.1)

## 2016-07-17 MED ORDER — INFLUENZA VAC SPLIT QUAD 0.5 ML IM SUSY
PREFILLED_SYRINGE | INTRAMUSCULAR | Status: AC
  Filled 2016-07-17: qty 0.5

## 2016-07-17 MED ORDER — FAMOTIDINE IN NACL 20-0.9 MG/50ML-% IV SOLN
20.0000 mg | Freq: Once | INTRAVENOUS | Status: AC
  Administered 2016-07-17: 20 mg via INTRAVENOUS
  Filled 2016-07-17: qty 50

## 2016-07-17 MED ORDER — DIPHENHYDRAMINE HCL 50 MG/ML IJ SOLN
50.0000 mg | Freq: Once | INTRAMUSCULAR | Status: AC
  Administered 2016-07-17: 50 mg via INTRAVENOUS
  Filled 2016-07-17: qty 1

## 2016-07-17 MED ORDER — PACLITAXEL CHEMO INJECTION 300 MG/50ML
175.0000 mg/m2 | Freq: Once | INTRAVENOUS | Status: AC
  Administered 2016-07-17: 366 mg via INTRAVENOUS
  Filled 2016-07-17: qty 61

## 2016-07-17 MED ORDER — SODIUM CHLORIDE 0.9 % IV SOLN
Freq: Once | INTRAVENOUS | Status: AC
  Administered 2016-07-17: 10:00:00 via INTRAVENOUS

## 2016-07-17 MED ORDER — ONDANSETRON HCL 8 MG PO TABS: 8.0000 mg | ORAL_TABLET | Freq: Three times a day (TID) | ORAL | 2 refills | Status: DC | PRN

## 2016-07-17 MED ORDER — PALONOSETRON HCL INJECTION 0.25 MG/5ML
0.2500 mg | Freq: Once | INTRAVENOUS | Status: AC
  Administered 2016-07-17: 0.25 mg via INTRAVENOUS
  Filled 2016-07-17: qty 5

## 2016-07-17 MED ORDER — SODIUM CHLORIDE 0.9 % IV SOLN
20.0000 mg | Freq: Once | INTRAVENOUS | Status: AC
  Administered 2016-07-17: 20 mg via INTRAVENOUS
  Filled 2016-07-17: qty 2

## 2016-07-17 MED ORDER — HEPARIN SOD (PORK) LOCK FLUSH 100 UNIT/ML IV SOLN
500.0000 [IU] | Freq: Once | INTRAVENOUS | Status: AC | PRN
  Administered 2016-07-17: 500 [IU]
  Filled 2016-07-17: qty 5

## 2016-07-17 MED ORDER — INFLUENZA VAC SPLIT QUAD 0.5 ML IM SUSY
0.5000 mL | PREFILLED_SYRINGE | Freq: Once | INTRAMUSCULAR | Status: AC
  Administered 2016-07-17: 0.5 mL via INTRAMUSCULAR

## 2016-07-17 MED ORDER — SODIUM CHLORIDE 0.9 % IV SOLN
682.2000 mg | Freq: Once | INTRAVENOUS | Status: AC
  Administered 2016-07-17: 680 mg via INTRAVENOUS
  Filled 2016-07-17: qty 68

## 2016-07-17 MED ORDER — SODIUM CHLORIDE 0.9% FLUSH
10.0000 mL | INTRAVENOUS | Status: DC | PRN
  Administered 2016-07-17: 10 mL
  Filled 2016-07-17: qty 10

## 2016-07-17 NOTE — Assessment & Plan Note (Addendum)
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  Started on systemic chemotherapy on 05/15/2016 consisting of Carboplatin/Paclitaxel after evaluation by Gyn Onc. Re-evaluation following cycle #3 with imaging demonstrated only a minimal response to therapy.  Gyn Onc recommended continued systemic therapy for a total of 6 cycles with re-evaluation of response following.  Oncology history updated.  Pre-treatment labs today: CBC diff, CMET, CA 125.  I personally reviewed and went over laboratory results with the patient.  The results are noted within this dictation.    She has been seen by Dr. Denman George on 07/09/2016: I reviewed her CT images from 07/04/16 with the patient. She has had minimal response on imaging. There is resolution of ascites, but upper abdominal omental disease remains stable. Her CA 125 has also minimally responded.  I am recommending an additional 3 cycles of carboplatin and paclitaxel prior to reassessment with reimaging. At that time, if she has had a more substantial response we will consider an interval cytoreductive effort.  She is doing well and denies any new complaints today.  She understands the reason for not undergoing surgery following cycle #3 and she is understands the concern regarding minimal response following 3 cycles.  Clinically, she is much improved compared to the start of therapy.    I have refilled her Zofran.  She will get the influenza vaccine today.  Return in 3 weeks for follow-up and next cycle of chemotherapy.

## 2016-07-17 NOTE — Telephone Encounter (Signed)
Called to check on patient.

## 2016-07-17 NOTE — Patient Instructions (Signed)
[place] at [facility] Discharge Instructions  RECOMMENDATIONS MADE BY THE CONSULTANT AND ANY TEST RESULTS WILL BE SENT TO YOUR REFERRING PHYSICIAN.  You were seen by Gershon Mussel today Refill on Zofran escribed Return in 3 weeks for follow up  Thank you for choosing [place] at [facility] to provide your oncology and hematology care.  To afford each patient quality time with our provider, please arrive at least 15 minutes before your scheduled appointment time.   Beginning January 23rd 2017 lab work for the [facility] [employer] will be done in the  Main lab at [facility] on 1st floor. If you have a lab appointment with the [facility] please come in thru the  Main Entrance and check in at the main information desk  You need to re-schedule your appointment should you arrive 10 or more minutes late.  We strive to give you quality time with our providers, and arriving late affects you and other patients whose appointments are after yours.  Also, if you no show three or more times for appointments you may be dismissed from the clinic at the providers discretion.     Again, thank you for choosing [facility].  Our hope is that these requests will decrease the amount of time that you wait before being seen by our physicians.       _____________________________________________________________  Should you have questions after your visit to [facility], please contact our office at [phone] between the hours of 8:30 a.m. and 4:30 p.m.  Voicemails left after 4:30 p.m. will not be returned until the following business day.  For prescription refill requests, have your pharmacy contact our office.         Resources For Cancer Patients and their Caregivers ? American Cancer Society: Can assist with transportation, wigs, general needs, runs Look Good Feel Better.        [phone] ? Cancer Care: Provides financial assistance,  online support groups, medication/co-pay assistance.  1-800-813-HOPE 2237432427) ? [place] Assists [place] cancer patients and their families through emotional , educational and financial support.  669-740-7941 ? [place] DSS Where to apply for food stamps, Medicaid and utility assistance. (878)371-3702 ? [employer]: Transportation to medical appointments. 4353040012 ? Social Security Administration: May apply for disability if have a Stage IV cancer. [phone] ? [employer], Disability and Transit Services: Assists with nutrition, care and transit needs. [place] Support Programs: @10RELATIVEDAYS @ > Cancer Support Group  2nd Tuesday of the month 1pm-2pm, Journey Room  > Creative Journey  3rd Tuesday of the month 1130am-1pm, Journey Room  > Look Good Feel Better  1st Wednesday of the month 10am-12 noon, Journey Room (Call [place] to register [phone])

## 2016-07-17 NOTE — Progress Notes (Signed)
Ellen Honour, MD [address] [place] [address]  Ovarian cancer, unspecified laterality [facility]) - Plan: ondansetron (ZOFRAN) 8 MG tablet  Preventative health care - Plan: Influenza vac split quadrivalent PF (FLUARIX) injection 0.5 mL  CURRENT THERAPY: Carboplatin/Paclitaxel beginning on 05/15/2016.  INTERVAL HISTORY: Ellen Hunt 65 y.o. 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  Started on systemic chemotherapy on 05/15/2016 consisting of Carboplatin/Paclitaxel after evaluation by Gyn Onc. Re-evaluation following cycle #3 with imaging demonstrated only a minimal response to therapy.  Gyn Onc recommended continued systemic therapy for a total of 6 cycles with re-evaluation of response following.    Ovarian cancer ([place])   04/29/2016 Imaging    CT abd/pelvis- Extensive omental caking as well as moderate amount of ascites within the abdomen most compatible with peritoneal metastatic disease, of unknown primary. This may potentially be ovarian or a GI in etiology.      04/30/2016 Tumor Marker    CA 125- 7149.0 (H)      05/01/2016 Procedure    US paracentesis- Successful ultrasound-guided paracentesis yielding 1.8 liters of peritoneal fluid.      05/01/2016 Imaging    US pelvis- Both transabdominal and transvaginal sonography are significantly limited by large patient habitus and ascites. Neither uterus or ovaries were visualized on this exam.      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      05/08/2016 Imaging    CT chest- No evidence of metastatic disease in the chest. Peritoneal/omental disease with abdominal ascites in the upper abdomen,  incompletely visualized.       05/13/2016 Procedure    Placement of single lumen port a cath via right internal jugular vein. The catheter tip lies at the cavoatrial junction. A power injectable port a cath was placed and is ready for immediate use.      05/15/2016 Procedure    US Paracentesis- 3400 ml yellow colored ascites removed      05/15/2016 - 06/26/2016 Chemotherapy    Carboplatin/Paclitaxel every 21 days x 3 cycles      07/01/2016 Miscellaneous    Genetic Counseling by [name]-  Genetic testing was normal, and did not reveal a deleterious mutation in these genes.       07/08/2016 Imaging    CT CAP- 1. Small volume ascites, significantly decreased. 2. Stable diffuse omental soft tissue caking and diffuse peritoneal thickening along the bilateral paracolic gutters and bilateral pelvic peritoneal reflections, consistent with peritoneal carcinomatosis. 3. Stable asymmetrically enlarged right ovary, which may represent the primary site of ovarian malignancy. 4. No evidence of metastatic disease in the chest. No new sites of metastatic disease in the abdomen or pelvis.      07/09/2016 Miscellaneous    Gyn Onc re-evaluation- modest response to therapy, 3 more cycles of chemotherapy recommended.        07/17/2016 -  Chemotherapy    The patient had palonosetron (ALOXI) injection 0.25 mg, 0.25 mg, Intravenous,  Once, 3 of 6 cycles  CARBOplatin (PARAPLATIN) 500 mg in sodium chloride 0.9 % 250 mL chemo infusion, 500 mg (100 % of original dose 496 mg), Intravenous,  Once, 3 of 6 cycles Dose modification:   (original dose 496 mg, Cycle 1), 750 mg (  original dose 496 mg, Cycle 3, Reason: Provider Judgment),   (original dose 496 mg, Cycle 4, Reason: Change in SCr/CrCl)  PACLitaxel (TAXOL) 396 mg in dextrose 5 % 500 mL chemo infusion (> 80mg /m2), 175 mg/m2 = 396 mg, Intravenous,  Once, 3 of 6 cycles  for chemotherapy treatment.         Her spirit as good. She recently saw Dr. Denman George  (Gyn Onc) who recommended 3 more cycles of chemotherapy and reevaluation for cytoreductive surgery.  Patient reports she understands why this decision was made.  She notes that her herpes zoster is signifcantly and improved.  She notes that the pain associated with this was worse than chemotherapy.  She denies any vomiting but notes intermittent nausea following chemotherapy that is well controlled with home antiemetics. She denies any abdominal pain. She notes that her abdomen is soft.  Review of Systems  Constitutional: Negative.  Negative for chills, fever and weight loss.  HENT: Negative.   Eyes: Negative.   Respiratory: Negative.  Negative for cough.   Cardiovascular: Negative.  Negative for chest pain.  Gastrointestinal: Positive for nausea. Negative for constipation, diarrhea and vomiting.  Genitourinary: Negative.   Musculoskeletal: Negative.   Skin: Negative.  Negative for rash.  Neurological: Negative.  Negative for weakness.  Endo/Heme/Allergies: Negative.   Psychiatric/Behavioral: Negative.     Past Medical History:  Diagnosis Date  . Abnormal CT of the abdomen 04/30/2016  . Diabetes mellitus without complication ([place])   . Family history of breast cancer   . Hyperlipidemia   . Hypertension   . Low serum vitamin D   . Ovarian cancer ([place]) 05/09/2016    Past Surgical History:  Procedure Laterality Date  . CESAREAN SECTION    . LUMBAR FUSION  08/21/15   L3-L4 Dr. Timmothy Euler    Family History  Problem Relation Age of Onset  . Lung cancer Mother     smoker; dx in her 11s  . Leukemia Father   . Diabetes Paternal Grandmother   . Heart attack Paternal Grandmother   . Diabetes Paternal Grandfather   . Breast cancer Paternal Aunt     dx in her 43s-30s  . Leukemia Paternal Uncle   . Heart attack Maternal Grandfather   . Breast cancer Cousin     maternal first cousin    Social History   Social History  . Marital status: Married    Spouse name: Gershon Mussel  .  Number of children: 2  . Years of education: N/A   Social History Main Topics  . Smoking status: Never Smoker  . Smokeless tobacco: Never Used  . Alcohol use No  . Drug use: No  . Sexual activity: Not Asked   Other Topics Concern  . None   Social History Narrative  . None     PHYSICAL EXAMINATION  ECOG PERFORMANCE STATUS: 1 - Symptomatic but completely ambulatory  Vitals:   07/17/16 0800  BP: (!) 136/53  Pulse: 100  Resp: 18  Temp: 98 F (36.7 C)    GENERAL:alert, no distress, well nourished, well developed, comfortable, cooperative, obese and accompanied by her husband, in chemo-recliner. SKIN: skin color, texture, turgor are normal, no rashes or significant lesions HEAD: Normocephalic, No masses, lesions, tenderness or abnormalities EYES: normal, EOMI, Conjunctiva are pink and non-injected EARS: External ears normal OROPHARYNX:lips, buccal mucosa, and tongue normal and mucous membranes are moist  NECK: supple, trachea midline LYMPH:  no palpable lymphadenopathy BREAST:not examined LUNGS: clear to auscultation and percussion  HEART: regular rate & rhythm, no murmurs, no gallops, S1 normal and S2 normal ABDOMEN:abdomen soft, non-tender, obese, normal bowel sounds and abdomen distended, but soft BACK: Back symmetric, no curvature., No CVA tenderness EXTREMITIES:less then 2 second capillary refill, no joint deformities, effusion, or inflammation, no skin discoloration, no clubbing, no cyanosis  NEURO: alert & oriented x 3 with fluent speech, no focal motor/sensory deficits, gait normal   LABORATORY DATA: CBC    Component Value Date/Time   WBC 8.6 07/17/2016 0849   RBC 3.08 (L) 07/17/2016 0849   HGB 9.5 (L) 07/17/2016 0849   HCT 28.9 (L) 07/17/2016 0849   PLT 196 07/17/2016 0849   MCV 93.8 07/17/2016 0849   MCV 87.0 12/21/2013 1200   MCH 30.8 07/17/2016 0849   MCHC 32.9 07/17/2016 0849   RDW 20.0 (H) 07/17/2016 0849   LYMPHSABS 0.4 (L) 07/17/2016 0849    MONOABS 0.2 07/17/2016 0849   EOSABS 0.0 07/17/2016 0849   BASOSABS 0.0 07/17/2016 0849      Chemistry      Component Value Date/Time   NA 134 (L) 07/17/2016 0849   NA 139 02/06/2016 1148   K 4.9 07/17/2016 0849   CL 101 07/17/2016 0849   CO2 20 (L) 07/17/2016 0849   BUN 30 (H) 07/17/2016 0849   BUN 21 02/06/2016 1148   CREATININE 0.99 07/17/2016 0849      Component Value Date/Time   CALCIUM 9.7 07/17/2016 0849   ALKPHOS 69 07/17/2016 0849   AST 23 07/17/2016 0849   ALT 20 07/17/2016 0849   BILITOT 0.4 07/17/2016 0849   BILITOT 0.3 07/02/2015 1005     Lab Results  Component Value Date   CA125 8,811.0 (H) 06/26/2016    PENDING LABS:   RADIOGRAPHIC STUDIES:  Ct Chest W Contrast  Result Date: 07/08/2016 CLINICAL DATA:  Stage IIIC ovarian cancer diagnosed June 2017, presenting for restaging status post neoadjuvant systemic chemotherapy. EXAM: CT CHEST, ABDOMEN, AND PELVIS WITH CONTRAST TECHNIQUE: Multidetector CT imaging of the chest, abdomen and pelvis was performed following the standard protocol during bolus administration of intravenous contrast. CONTRAST:  56mL ISOVUE-300 IOPAMIDOL (ISOVUE-300) INJECTION 61% COMPARISON:  05/08/2016 chest CT and 04/29/2016 CT abdomen/ pelvis. FINDINGS: CT CHEST FINDINGS Mediastinum/Nodes: Normal heart size. No significant pericardial fluid/thickening. Right internal jugular MediPort terminates in the lower third of the superior vena cava. Left main coronary atherosclerosis. Mildly atherosclerotic nonaneurysmal thoracic aorta. Normal caliber pulmonary arteries. No central pulmonary emboli. Stable mild multinodular goiter with stable peripherally calcified 0.8 cm left thyroid lobe nodule. No appreciable esophageal wall thickening. Mild oral contrast layering in the mid thoracic esophagus. No pathologically enlarged axillary, mediastinal or hilar lymph nodes. Stable coarsely calcified subcarinal and left hilar nodes from prior granulomatous disease.  Lungs/Pleura: No pneumothorax. No pleural effusion. No acute consolidative airspace disease, lung masses or significant pulmonary nodules. Musculoskeletal: No aggressive appearing focal osseous lesions. Moderate thoracic spondylosis. CT ABDOMEN PELVIS FINDINGS Hepatobiliary: Normal liver size. Hypodense lateral segment left liver lobe 0.6 cm liver lesion (series 2/image 40) is too small to characterize and stable. No additional liver lesions. Normal gallbladder with no radiopaque cholelithiasis. No biliary ductal dilatation. Pancreas: Normal, with no mass or duct dilation. Spleen: Normal size spleen. Stable scattered granulomatous calcifications in the spleen. no splenic mass. Adrenals/Urinary Tract: No discrete adrenal nodules. Nonobstructing 6 mm lower left renal stone. No hydronephrosis. No renal mass. Normal caliber ureters. Relatively collapsed and grossly normal bladder. Stomach/Bowel: Grossly normal stomach. Normal caliber small bowel with no  small bowel wall thickening. Normal appendix. Normal large bowel with no diverticulosis, large bowel wall thickening or pericolonic fat stranding. Vascular/Lymphatic: Atherosclerotic nonaneurysmal abdominal aorta. Patent portal, splenic, hepatic and renal veins. No pathologically enlarged lymph nodes in the abdomen or pelvis. Reproductive: Stable mildly enlarged uterus with the suggestion of a 4.0 cm right uterine fibroid. Asymmetrically enlarged right ovary measuring 3.2 x 2.0 cm (left ovary measures 2.3 x 1.5 cm), not appreciably changed. Other: Small volume ascites is significantly decreased. No pneumoperitoneum. Diffuse omental soft tissue caking is not appreciably changed. Diffuse peritoneal thickening along the pericolic gutters and bilateral pelvic peritoneal reflections, not appreciably changed. Musculoskeletal: No aggressive appearing focal osseous lesions. Status post left lateral spinal fusion at L3-4. Moderate lumbar spondylosis, predominantly at L3-4.  IMPRESSION: 1. Small volume ascites, significantly decreased. 2. Stable diffuse omental soft tissue caking and diffuse peritoneal thickening along the bilateral paracolic gutters and bilateral pelvic peritoneal reflections, consistent with peritoneal carcinomatosis. 3. Stable asymmetrically enlarged right ovary, which may represent the primary site of ovarian malignancy. 4. No evidence of metastatic disease in the chest. No new sites of metastatic disease in the abdomen or pelvis. 5. Additional findings include aortic atherosclerosis, left main coronary atherosclerosis, nonobstructing left renal stone and probable uterine fibroid. Electronically Signed   By: Ilona Sorrel M.D.   On: 07/08/2016 12:55   Ct Abdomen Pelvis W Contrast  Result Date: 07/08/2016 CLINICAL DATA:  Stage IIIC ovarian cancer diagnosed June 2017, presenting for restaging status post neoadjuvant systemic chemotherapy. EXAM: CT CHEST, ABDOMEN, AND PELVIS WITH CONTRAST TECHNIQUE: Multidetector CT imaging of the chest, abdomen and pelvis was performed following the standard protocol during bolus administration of intravenous contrast. CONTRAST:  55mL ISOVUE-300 IOPAMIDOL (ISOVUE-300) INJECTION 61% COMPARISON:  05/08/2016 chest CT and 04/29/2016 CT abdomen/ pelvis. FINDINGS: CT CHEST FINDINGS Mediastinum/Nodes: Normal heart size. No significant pericardial fluid/thickening. Right internal jugular MediPort terminates in the lower third of the superior vena cava. Left main coronary atherosclerosis. Mildly atherosclerotic nonaneurysmal thoracic aorta. Normal caliber pulmonary arteries. No central pulmonary emboli. Stable mild multinodular goiter with stable peripherally calcified 0.8 cm left thyroid lobe nodule. No appreciable esophageal wall thickening. Mild oral contrast layering in the mid thoracic esophagus. No pathologically enlarged axillary, mediastinal or hilar lymph nodes. Stable coarsely calcified subcarinal and left hilar nodes from prior  granulomatous disease. Lungs/Pleura: No pneumothorax. No pleural effusion. No acute consolidative airspace disease, lung masses or significant pulmonary nodules. Musculoskeletal: No aggressive appearing focal osseous lesions. Moderate thoracic spondylosis. CT ABDOMEN PELVIS FINDINGS Hepatobiliary: Normal liver size. Hypodense lateral segment left liver lobe 0.6 cm liver lesion (series 2/image 40) is too small to characterize and stable. No additional liver lesions. Normal gallbladder with no radiopaque cholelithiasis. No biliary ductal dilatation. Pancreas: Normal, with no mass or duct dilation. Spleen: Normal size spleen. Stable scattered granulomatous calcifications in the spleen. no splenic mass. Adrenals/Urinary Tract: No discrete adrenal nodules. Nonobstructing 6 mm lower left renal stone. No hydronephrosis. No renal mass. Normal caliber ureters. Relatively collapsed and grossly normal bladder. Stomach/Bowel: Grossly normal stomach. Normal caliber small bowel with no small bowel wall thickening. Normal appendix. Normal large bowel with no diverticulosis, large bowel wall thickening or pericolonic fat stranding. Vascular/Lymphatic: Atherosclerotic nonaneurysmal abdominal aorta. Patent portal, splenic, hepatic and renal veins. No pathologically enlarged lymph nodes in the abdomen or pelvis. Reproductive: Stable mildly enlarged uterus with the suggestion of a 4.0 cm right uterine fibroid. Asymmetrically enlarged right ovary measuring 3.2 x 2.0 cm (left ovary measures 2.3 x 1.5  cm), not appreciably changed. Other: Small volume ascites is significantly decreased. No pneumoperitoneum. Diffuse omental soft tissue caking is not appreciably changed. Diffuse peritoneal thickening along the pericolic gutters and bilateral pelvic peritoneal reflections, not appreciably changed. Musculoskeletal: No aggressive appearing focal osseous lesions. Status post left lateral spinal fusion at L3-4. Moderate lumbar spondylosis,  predominantly at L3-4. IMPRESSION: 1. Small volume ascites, significantly decreased. 2. Stable diffuse omental soft tissue caking and diffuse peritoneal thickening along the bilateral paracolic gutters and bilateral pelvic peritoneal reflections, consistent with peritoneal carcinomatosis. 3. Stable asymmetrically enlarged right ovary, which may represent the primary site of ovarian malignancy. 4. No evidence of metastatic disease in the chest. No new sites of metastatic disease in the abdomen or pelvis. 5. Additional findings include aortic atherosclerosis, left main coronary atherosclerosis, nonobstructing left renal stone and probable uterine fibroid. Electronically Signed   By: Ilona Sorrel M.D.   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  Started on systemic chemotherapy on 05/15/2016 consisting of Carboplatin/Paclitaxel after evaluation by Gyn Onc. Re-evaluation following cycle #3 with imaging demonstrated only a minimal response to therapy.  Gyn Onc recommended continued systemic therapy for a total of 6 cycles with re-evaluation of response following.  Oncology history updated.  Pre-treatment labs today: CBC diff, CMET, CA 125.  I personally reviewed and went over laboratory results with the patient.  The results are noted within this dictation.    She has been seen by Dr. Denman George on 07/09/2016: I reviewed her CT images from 07/04/16 with the patient. She has had minimal response on imaging. There is resolution of ascites, but upper abdominal omental disease remains stable. Her CA 125 has also minimally responded.  I am recommending an additional 3 cycles of carboplatin and paclitaxel prior to reassessment with reimaging. At that time, if she has  had a more substantial response we will consider an interval cytoreductive effort.  She is doing well and denies any new complaints today.  She understands the reason for not undergoing surgery following cycle #3 and she is understands the concern regarding minimal response following 3 cycles.  Clinically, she is much improved compared to the start of therapy.    I have refilled her Zofran.  She will get the influenza vaccine today.  Return in 3 weeks for follow-up and next cycle of chemotherapy.   ORDERS PLACED FOR THIS ENCOUNTER: No orders of the defined types were placed in this encounter.   MEDICATIONS PRESCRIBED THIS ENCOUNTER: Meds ordered this encounter  Medications  . Influenza vac split quadrivalent PF (FLUARIX) injection 0.5 mL  . ondansetron (ZOFRAN) 8 MG tablet    Sig: Take 1 tablet (8 mg total) by mouth every 8 (eight) hours as needed for nausea or vomiting.    Dispense:  45 tablet    Refill:  2    Order Specific Question:   Supervising Provider    Answer:   Patrici Ranks [id number]    THERAPY PLAN:  Continue with treatment as planned.  All questions were answered. The patient knows to call the clinic with any problems, questions or concerns. We can certainly see the patient much sooner if necessary.  Patient and plan discussed with Dr. Ancil Linsey and she is in agreement with the aforementioned.   This note  is electronically signed by: Doy Mince 07/17/2016 9:42 PM

## 2016-07-17 NOTE — Progress Notes (Signed)
Ellen Hunt tolerated chemo tx and Influenza vaccine well without incident. VSS upon discharge. Pt discharged self ambulatory in satisfactory condition with husband

## 2016-07-17 NOTE — Progress Notes (Signed)
1000 Labs shown to [employer] [facility], chemo approved for today

## 2016-07-17 NOTE — Patient Instructions (Signed)
[facility] Discharge Instructions for Patients Receiving Chemotherapy   Beginning January 23rd 2017 lab work for the [facility] will be done in the  Main lab at [facility] on 1st floor. If you have a lab appointment with the [place] please come in thru the  Main Entrance and check in at the main information desk   Today you received the following chemotherapy agents Taxol and Carboplatin as well as Influenza vaccine. Follow-up as scheduled. Call clinic for any questions or concerns.  To help prevent nausea and vomiting after your treatment, we encourage you to take your nausea medication   If you develop nausea and vomiting, or diarrhea that is not controlled by your medication, call the clinic.  The clinic phone number is [phone]. Office hours are Monday-Friday 8:30am-5:00pm.  BELOW ARE SYMPTOMS THAT SHOULD BE REPORTED IMMEDIATELY:  *FEVER GREATER THAN 101.0 F  *CHILLS WITH OR WITHOUT FEVER  NAUSEA AND VOMITING THAT IS NOT CONTROLLED WITH YOUR NAUSEA MEDICATION  *UNUSUAL SHORTNESS OF BREATH  *UNUSUAL BRUISING OR BLEEDING  TENDERNESS IN MOUTH AND THROAT WITH OR WITHOUT PRESENCE OF ULCERS  *URINARY PROBLEMS  *BOWEL PROBLEMS  UNUSUAL RASH Items with * indicate a potential emergency and should be followed up as soon as possible. If you have an emergency after office hours please contact your primary care physician or go to the nearest emergency department.  Please call the clinic during office hours if you have any questions or concerns.   You may also contact the Patient Navigator at [phone] should you have any questions or need assistance in obtaining follow up care.      Resources For Cancer Patients and their Caregivers ? American Cancer Society: Can assist with transportation, wigs, general needs, runs Look Good Feel Better.        260-011-4069 ? Cancer Care: Provides financial assistance, online support groups,  medication/co-pay assistance.  1-800-813-HOPE 847-877-1479) ? [place] Assists [place] cancer patients and their families through emotional , educational and financial support.  6821511986 ? [place] DSS Where to apply for food stamps, Medicaid and utility assistance. (630)485-2128 ? [employer]: Transportation to medical appointments. 534-471-5242 ? Social Security Administration: May apply for disability if have a Stage IV cancer. (929)699-6006 872-602-1992 ? [employer], Disability and Transit Services: Assists with nutrition, care and transit needs. [phone]

## 2016-07-18 ENCOUNTER — Other Ambulatory Visit (HOSPITAL_COMMUNITY): Payer: Self-pay | Admitting: Pharmacist

## 2016-07-18 LAB — CA 125: CA 125: 5202 U/mL — AB (ref 0.0–38.1)

## 2016-07-23 ENCOUNTER — Telehealth (HOSPITAL_COMMUNITY): Payer: Self-pay | Admitting: *Deleted

## 2016-07-23 ENCOUNTER — Telehealth (HOSPITAL_COMMUNITY): Payer: Self-pay

## 2016-07-23 ENCOUNTER — Other Ambulatory Visit (HOSPITAL_COMMUNITY): Payer: Self-pay | Admitting: Oncology

## 2016-07-23 DIAGNOSIS — C569 Malignant neoplasm of unspecified ovary: Secondary | ICD-10-CM

## 2016-07-23 MED ORDER — HYDROCODONE-ACETAMINOPHEN 10-325 MG PO TABS: 1.0000 | ORAL_TABLET | ORAL | 0 refills | Status: DC | PRN

## 2016-07-23 MED ORDER — DIAZEPAM 5 MG PO TABS: 2.5000 mg | ORAL_TABLET | ORAL | 1 refills | Status: DC | PRN

## 2016-07-23 NOTE — Telephone Encounter (Signed)
-----   Message from Epifanio Lesches sent at 07/23/2016  9:51 AM EDT ----- Would like the results from her CA 125

## 2016-08-07 ENCOUNTER — Ambulatory Visit (HOSPITAL_COMMUNITY): Payer: BC Managed Care – PPO

## 2016-08-07 ENCOUNTER — Encounter (HOSPITAL_COMMUNITY): Payer: Self-pay | Admitting: Hematology & Oncology

## 2016-08-07 ENCOUNTER — Encounter (HOSPITAL_COMMUNITY): Payer: BC Managed Care – PPO | Attending: Oncology | Admitting: Hematology & Oncology

## 2016-08-07 ENCOUNTER — Ambulatory Visit (HOSPITAL_COMMUNITY): Payer: BC Managed Care – PPO | Admitting: Hematology & Oncology

## 2016-08-07 ENCOUNTER — Encounter (HOSPITAL_COMMUNITY): Payer: BC Managed Care – PPO

## 2016-08-07 VITALS — BP 146/58 | HR 95 | Temp 97.8°F | Resp 20 | Wt 213.0 lb

## 2016-08-07 DIAGNOSIS — C569 Malignant neoplasm of unspecified ovary: Secondary | ICD-10-CM

## 2016-08-07 DIAGNOSIS — D630 Anemia in neoplastic disease: Secondary | ICD-10-CM

## 2016-08-07 DIAGNOSIS — R935 Abnormal findings on diagnostic imaging of other abdominal regions, including retroperitoneum: Secondary | ICD-10-CM | POA: Diagnosis present

## 2016-08-07 DIAGNOSIS — H409 Unspecified glaucoma: Secondary | ICD-10-CM

## 2016-08-07 DIAGNOSIS — R110 Nausea: Secondary | ICD-10-CM

## 2016-08-07 DIAGNOSIS — D6481 Anemia due to antineoplastic chemotherapy: Secondary | ICD-10-CM | POA: Diagnosis not present

## 2016-08-07 DIAGNOSIS — E860 Dehydration: Secondary | ICD-10-CM

## 2016-08-07 DIAGNOSIS — T451X5A Adverse effect of antineoplastic and immunosuppressive drugs, initial encounter: Secondary | ICD-10-CM

## 2016-08-07 DIAGNOSIS — Z5111 Encounter for antineoplastic chemotherapy: Secondary | ICD-10-CM

## 2016-08-07 DIAGNOSIS — G620 Drug-induced polyneuropathy: Secondary | ICD-10-CM | POA: Insufficient documentation

## 2016-08-07 DIAGNOSIS — B029 Zoster without complications: Secondary | ICD-10-CM | POA: Diagnosis not present

## 2016-08-07 DIAGNOSIS — Z1379 Encounter for other screening for genetic and chromosomal anomalies: Secondary | ICD-10-CM

## 2016-08-07 DIAGNOSIS — Z9289 Personal history of other medical treatment: Secondary | ICD-10-CM

## 2016-08-07 LAB — PREPARE RBC (CROSSMATCH)

## 2016-08-07 LAB — COMPREHENSIVE METABOLIC PANEL
ALT: 18 U/L (ref 14–54)
ANION GAP: 8 (ref 5–15)
AST: 21 U/L (ref 15–41)
Albumin: 3.6 g/dL (ref 3.5–5.0)
Alkaline Phosphatase: 59 U/L (ref 38–126)
BILIRUBIN TOTAL: 0.4 mg/dL (ref 0.3–1.2)
BUN: 37 mg/dL — AB (ref 6–20)
CHLORIDE: 106 mmol/L (ref 101–111)
CO2: 20 mmol/L — ABNORMAL LOW (ref 22–32)
Calcium: 9.3 mg/dL (ref 8.9–10.3)
Creatinine, Ser: 1.16 mg/dL — ABNORMAL HIGH (ref 0.44–1.00)
GFR, EST AFRICAN AMERICAN: 57 mL/min — AB (ref >60)
GFR, EST NON AFRICAN AMERICAN: 49 mL/min — AB (ref >60)
Glucose, Bld: 325 mg/dL — ABNORMAL HIGH (ref 65–99)
POTASSIUM: 5.4 mmol/L — AB (ref 3.5–5.1)
Sodium: 134 mmol/L — ABNORMAL LOW (ref 135–145)
TOTAL PROTEIN: 6.5 g/dL (ref 6.5–8.1)

## 2016-08-07 LAB — CBC WITH DIFFERENTIAL/PLATELET
Basophils Absolute: 0 10*3/uL (ref 0.0–0.1)
Basophils Relative: 0 %
EOS PCT: 0 %
Eosinophils Absolute: 0 10*3/uL (ref 0.0–0.7)
HCT: 25.1 % — ABNORMAL LOW (ref 36.0–46.0)
Hemoglobin: 8.5 g/dL — ABNORMAL LOW (ref 12.0–15.0)
LYMPHS ABS: 0.3 10*3/uL — AB (ref 0.7–4.0)
Lymphocytes Relative: 6 %
MCH: 32.7 pg (ref 26.0–34.0)
MCHC: 33.9 g/dL (ref 30.0–36.0)
MCV: 96.5 fL (ref 78.0–100.0)
MONO ABS: 0.1 10*3/uL (ref 0.1–1.0)
Monocytes Relative: 2 %
NEUTROS ABS: 4 10*3/uL (ref 1.7–7.7)
Neutrophils Relative %: 92 %
PLATELETS: 133 10*3/uL — AB (ref 150–400)
RBC: 2.6 MIL/uL — AB (ref 3.87–5.11)
RDW: 19.9 % — AB (ref 11.5–15.5)
WBC Morphology: INCREASED
WBC: 4.4 10*3/uL (ref 4.0–10.5)

## 2016-08-07 LAB — ABO/RH: ABO/RH(D): O POS

## 2016-08-07 MED ORDER — SODIUM CHLORIDE 0.9 % IV SOLN
604.2000 mg | Freq: Once | INTRAVENOUS | Status: AC
  Administered 2016-08-07: 600 mg via INTRAVENOUS
  Filled 2016-08-07: qty 60

## 2016-08-07 MED ORDER — LORAZEPAM 2 MG/ML IJ SOLN
0.5000 mg | INTRAMUSCULAR | Status: AC
  Administered 2016-08-07: 0.5 mg via INTRAVENOUS

## 2016-08-07 MED ORDER — PALONOSETRON HCL INJECTION 0.25 MG/5ML
0.2500 mg | Freq: Once | INTRAVENOUS | Status: AC
  Administered 2016-08-07: 0.25 mg via INTRAVENOUS
  Filled 2016-08-07: qty 5

## 2016-08-07 MED ORDER — PACLITAXEL CHEMO INJECTION 300 MG/50ML
175.0000 mg/m2 | Freq: Once | INTRAVENOUS | Status: AC
  Administered 2016-08-07: 366 mg via INTRAVENOUS
  Filled 2016-08-07: qty 50

## 2016-08-07 MED ORDER — SODIUM CHLORIDE 0.9 % IV SOLN
Freq: Once | INTRAVENOUS | Status: AC
  Administered 2016-08-07: 11:00:00 via INTRAVENOUS

## 2016-08-07 MED ORDER — HEPARIN SOD (PORK) LOCK FLUSH 100 UNIT/ML IV SOLN
500.0000 [IU] | Freq: Once | INTRAVENOUS | Status: AC | PRN
  Administered 2016-08-07: 500 [IU]

## 2016-08-07 MED ORDER — DEXAMETHASONE SODIUM PHOSPHATE 100 MG/10ML IJ SOLN
20.0000 mg | Freq: Once | INTRAMUSCULAR | Status: AC
  Administered 2016-08-07: 20 mg via INTRAVENOUS
  Filled 2016-08-07: qty 2

## 2016-08-07 MED ORDER — ACETAMINOPHEN 325 MG PO TABS
ORAL_TABLET | ORAL | Status: AC
  Filled 2016-08-07: qty 2

## 2016-08-07 MED ORDER — HEPARIN SOD (PORK) LOCK FLUSH 100 UNIT/ML IV SOLN
INTRAVENOUS | Status: AC
  Filled 2016-08-07: qty 5

## 2016-08-07 MED ORDER — LORAZEPAM 2 MG/ML IJ SOLN
INTRAMUSCULAR | Status: AC
  Filled 2016-08-07: qty 1

## 2016-08-07 MED ORDER — ACETAMINOPHEN 325 MG PO TABS
650.0000 mg | ORAL_TABLET | ORAL | Status: AC
  Administered 2016-08-07: 650 mg via ORAL

## 2016-08-07 MED ORDER — DIPHENHYDRAMINE HCL 50 MG/ML IJ SOLN
50.0000 mg | Freq: Once | INTRAMUSCULAR | Status: AC
  Administered 2016-08-07: 50 mg via INTRAVENOUS
  Filled 2016-08-07: qty 1

## 2016-08-07 MED ORDER — SODIUM CHLORIDE 0.9 % IV SOLN
INTRAVENOUS | Status: DC
Start: 2016-08-07 — End: 2016-08-07
  Administered 2016-08-07: 11:00:00 via INTRAVENOUS

## 2016-08-07 MED ORDER — SODIUM CHLORIDE 0.9 % IV SOLN
250.0000 mL | Freq: Once | INTRAVENOUS | Status: AC
  Administered 2016-08-07: 250 mL via INTRAVENOUS

## 2016-08-07 MED ORDER — SODIUM CHLORIDE 0.9% FLUSH
10.0000 mL | INTRAVENOUS | Status: DC | PRN
  Administered 2016-08-07: 10 mL
  Filled 2016-08-07: qty 10

## 2016-08-07 MED ORDER — FAMOTIDINE IN NACL 20-0.9 MG/50ML-% IV SOLN
20.0000 mg | Freq: Once | INTRAVENOUS | Status: AC
  Administered 2016-08-07: 20 mg via INTRAVENOUS
  Filled 2016-08-07: qty 50

## 2016-08-07 NOTE — Patient Instructions (Signed)
[place] at [facility] Discharge Instructions  RECOMMENDATIONS MADE BY THE CONSULTANT AND ANY TEST RESULTS WILL BE SENT TO YOUR REFERRING PHYSICIAN.  Treatment today.  You will return to the clinic in three weeks for chemo, labs, and an MD appointment.    You will have scans in November.   We will refer you back to Dr. Denman George the week of November 11.    Thank you for choosing [place] at [facility] to provide your oncology and hematology care.  To afford each patient quality time with our provider, please arrive at least 15 minutes before your scheduled appointment time.   Beginning January 23rd 2017 lab work for the [facility] will be done in the  Main lab at [facility] on 1st floor. If you have a lab appointment with the [place] please come in thru the  Main Entrance and check in at the main information desk  You need to re-schedule your appointment should you arrive 10 or more minutes late.  We strive to give you quality time with our providers, and arriving late affects you and other patients whose appointments are after yours.  Also, if you no show three or more times for appointments you may be dismissed from the clinic at the providers discretion.     Again, thank you for choosing [facility].  Our hope is that these requests will decrease the amount of time that you wait before being seen by our physicians.       _____________________________________________________________  Should you have questions after your visit to [facility], please contact our office at [phone] between the hours of 8:30 a.m. and 4:30 p.m.  Voicemails left after 4:30 p.m. will not be returned until the following business day.  For prescription refill requests, have your pharmacy contact our office.         Resources For Cancer Patients and their Caregivers ? American Cancer Society: Can assist with transportation,  wigs, general needs, runs Look Good Feel Better.        548-087-9355 ? Cancer Care: Provides financial assistance, online support groups, medication/co-pay assistance.  1-800-813-HOPE (239)754-8672) ? [place] Assists [place] cancer patients and their families through emotional , educational and financial support.  (604) 198-0020 ? [place] DSS Where to apply for food stamps, Medicaid and utility assistance. 440-037-6475 ? [employer]: Transportation to medical appointments. (754) 566-5134 ? Social Security Administration: May apply for disability if have a Stage IV cancer. 979-841-4644 8721769080 ? [employer], Disability and Transit Services: Assists with nutrition, care and transit needs. [place] Support Programs: @10RELATIVEDAYS @ > Cancer Support Group  2nd Tuesday of the month 1pm-2pm, Journey Room  > Creative Journey  3rd Tuesday of the month 1130am-1pm, Journey Room  > Look Good Feel Better  1st Wednesday of the month 10am-12 noon, Journey Room (Call [place] to register [phone])

## 2016-08-07 NOTE — Assessment & Plan Note (Addendum)
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  Started on systemic chemotherapy on 05/15/2016 consisting of Carboplatin/Paclitaxel after evaluation by Gyn Onc. Re-evaluation following cycle #3 with imaging demonstrated only a minimal response to therapy.  Gyn Onc recommended continued systemic therapy for a total of 6 cycles with re-evaluation of response following.  Pre-treatment labs today: CBC diff, CMET, CA 125.  I personally reviewed and went over laboratory results with the patient.  The results are noted within this dictation.    She has been seen by Dr. Denman George on 07/09/2016: I reviewed her CT images from 07/04/16 with the patient. She has had minimal response on imaging. There is resolution of ascites, but upper abdominal omental disease remains stable. Her CA 125 has also minimally responded.  I am recommending an additional 3 cycles of carboplatin and paclitaxel prior to reassessment with reimaging. At that time, if she has had a more substantial response we will consider an interval cytoreductive effort.  She is doing well and denies any new complaints today.  She understands the reason for not undergoing surgery following cycle #3 and she is understands the concern regarding minimal response following 3 cycles.  Clinically, she is much improved compared to the start of therapy.    No medication refills requested today.   I have ordered CT imaging after cycle #6 of therapy, I have also arranged for follow-up with Dr. Denman George.   Return in 3 weeks for follow-up and next cycle of chemotherapy.

## 2016-08-07 NOTE — Progress Notes (Signed)
[facility] Hematology/Oncology Progress Note   Name: Ellen Hunt      MRN: [id number]    Date: 08/09/2016 Time:11:14 AM   REFERRING PHYSICIAN:  Milton Ferguson, MD (ED physcian)   DIAGNOSIS:  Ovarian Carcinoma    Ovarian cancer ([place])   04/29/2016 Imaging    CT abd/pelvis- Extensive omental caking as well as moderate amount of ascites within the abdomen most compatible with peritoneal metastatic disease, of unknown primary. This may potentially be ovarian or a GI in etiology.      04/30/2016 Tumor Marker    CA 125- 7149.0 (H)      05/01/2016 Procedure    US paracentesis- Successful ultrasound-guided paracentesis yielding 1.8 liters of peritoneal fluid.      05/01/2016 Imaging    US pelvis- Both transabdominal and transvaginal sonography are significantly limited by large patient habitus and ascites. Neither uterus or ovaries were visualized on this exam.      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      05/08/2016 Imaging    CT chest- No evidence of metastatic disease in the chest. Peritoneal/omental disease with abdominal ascites in the upper abdomen, incompletely visualized.       05/13/2016 Procedure    Placement of single lumen port a cath via right internal jugular vein. The catheter tip lies at the cavoatrial junction. A power injectable port a cath was placed and is ready for immediate use.      05/15/2016 Procedure    US Paracentesis- 3400 ml yellow colored ascites removed      05/15/2016 - 06/26/2016 Chemotherapy    Carboplatin/Paclitaxel every 21 days x 3 cycles      07/01/2016 Miscellaneous    Genetic Counseling by [name]-  Genetic testing was normal, and did not reveal a deleterious mutation in these genes.       07/08/2016 Imaging    CT CAP- 1. Small volume ascites, significantly decreased. 2. Stable diffuse omental  soft tissue caking and diffuse peritoneal thickening along the bilateral paracolic gutters and bilateral pelvic peritoneal reflections, consistent with peritoneal carcinomatosis. 3. Stable asymmetrically enlarged right ovary, which may represent the primary site of ovarian malignancy. 4. No evidence of metastatic disease in the chest. No new sites of metastatic disease in the abdomen or pelvis.      07/09/2016 Miscellaneous    Gyn Onc re-evaluation- modest response to therapy, 3 more cycles of chemotherapy recommended.        07/17/2016 -  Chemotherapy    The patient had palonosetron (ALOXI) injection 0.25 mg, 0.25 mg, Intravenous,  Once, 3 of 6 cycles  CARBOplatin (PARAPLATIN) 500 mg in sodium chloride 0.9 % 250 mL chemo infusion, 500 mg (100 % of original dose 496 mg), Intravenous,  Once, 3 of 6 cycles Dose modification:   (original dose 496 mg, Cycle 1), 750 mg (original dose 496 mg, Cycle 3, Reason: Provider Judgment),   (original dose 496 mg, Cycle 4, Reason: Change in SCr/CrCl)  PACLitaxel (TAXOL) 396 mg in dextrose 5 % 500 mL chemo infusion (> 32m/m2), 175 mg/m2 = 396 mg, Intravenous,  Once, 3 of 6 cycles  for chemotherapy treatment.          HISTORY OF PRESENT ILLNESS:   Ellen Hunt a 65y.o. female with a diagnosis of ovarian carcinoma. She presents today for ongoing follow-up.   Patient  is accompanied by her husband.  She is on cycle 5 of Carboplatin/Taxol.   She has rhinorrhea on right side where she had shingles. She states that she only experiences rhinorrhea while eating.   Mrs. Pundt reports that chemotherapy has caused her hands to ache, but this pain is not a burning or tingling sensation, "just achy." She says that sometimes she has trouble opening a bottle, but she has no issues writing and believes she can button a shirt. Patient also has no issues with her lower extremities: She can feel the stairs, floor, and gas peddle. She notes that the first week after  chemo, she experiences some fatigue.   Patient denies nausea, vomiting, abdominal pain, or bowel issues. She takes nausea pill and Miralax to manage these issues.   Mrs. Tartaglia says that she recently had an eye exam and was told she has glaucoma. She says she will hold off on treatment for glaucoma but will be seeing a retina specialist on the Wednesday before chemotherapy treatment.  No other concerns today. She does not need any refills.    Review of Systems  Constitutional: Negative for chills, fever, malaise/fatigue and weight loss.  HENT: Negative.   Respiratory: Positive for shortness of breath. Negative for cough, sputum production and wheezing.   Cardiovascular: Positive for leg swelling (B/L).  Gastrointestinal: Positive for abdominal pain and diarrhea. Negative for nausea and vomiting.  Genitourinary: Negative.   Musculoskeletal: Negative.   Skin: Negative.   Neurological: Negative.   Endo/Heme/Allergies: Negative.   Psychiatric/Behavioral: Negative.    14 point review of systems was performed and is negative except as detailed under history of present illness and above  PAST MEDICAL HISTORY:   Past Medical History:  Diagnosis Date  . Abnormal CT of the abdomen 04/30/2016  . Diabetes mellitus without complication ([place])   . Family history of breast cancer   . Hyperlipidemia   . Hypertension   . Low serum vitamin D   . Ovarian cancer ([place]) 05/09/2016    ALLERGIES: Allergies  Allergen Reactions  . Avandia [Rosiglitazone] Other (See Comments)    Legs swelled  . Micronase [Glyburide] Swelling  . Actos [Pioglitazone] Other (See Comments)    Edema / leg swelling      MEDICATIONS: I have reviewed the patient's current medications.    Current Outpatient Prescriptions on File Prior to Visit  Medication Sig Dispense Refill  . aspirin 81 MG tablet Take 1 tablet (81 mg total) by mouth daily. (Patient taking differently: Take 81 mg by mouth every evening. ) 30 tablet   .  atorvastatin (LIPITOR) 40 MG tablet TAKE 1 TABLET BY MOUTH EVERY DAY 90 tablet 0  . benazepril (LOTENSIN) 10 MG tablet TAKE 1 TABLET BY MOUTH EVERY DAY 90 tablet 0  . CARBOPLATIN IV Inject into the vein. Every 21 days    . cephALEXin (KEFLEX) 500 MG capsule Take 2 capsules (1,000 mg total) by mouth 2 (two) times daily. 20 capsule 0  . dexamethasone (DECADRON) 4 MG tablet The day before chemo take 5 tabs (56m total) in the am and 5 tabs (238mtotal) in the pm. The morning of chemo take 5 tabs (2015motal). 45 tablet 1  . diazepam (VALIUM) 5 MG tablet Take 0.5 tablets (2.5 mg total) by mouth every 4 (four) hours as needed (spasms). 30 tablet 1  . furosemide (LASIX) 20 MG tablet Take 1 tablet (20 mg total) by mouth daily. 30 tablet 5  . glucagon (GLUCAGON EMERGENCY) 1  MG injection Inject as directed as needed for low blood glucose if unresponsive. 1 each 0  . glucosamine-chondroitin 500-400 MG tablet Take 1 tablet by mouth 2 (two) times daily.    Marland Kitchen glucose blood (ONE TOUCH ULTRA TEST) test strip USE TO CHECK BLOOD SUGAR UP TO 5 TIMES A DAY 450 each 2  . HYDROcodone-acetaminophen (NORCO) 10-325 MG tablet Take 1 tablet by mouth every 4 (four) hours as needed. 90 tablet 0  . lidocaine-prilocaine (EMLA) cream Apply a quarter size amount to port site 1 hour prior to chemo. Do not rub in. Cover with plastic wrap. 30 g 3  . metFORMIN (GLUCOPHAGE) 1000 MG tablet TAKE 1 TABLET BY MOUTH TWICE DAILY WITH A MEAL (Patient taking differently: Take 1,000 mg by mouth 2 (two) times daily with a meal. ) 180 tablet 1  . Multiple Vitamin (MULTIVITAMIN WITH MINERALS) TABS Take 1 tablet by mouth daily.    Marland Kitchen NOVOLOG 100 UNIT/ML injection USE IN INSULIN PUMP AS DIRECTED (MAX DAILY DOSE IS 110 UNITS PER DAY) 40 mL 2  . omeprazole (PRILOSEC) 40 MG capsule Take 1 capsule (40 mg total) by mouth daily. 30 capsule 3  . ondansetron (ZOFRAN) 8 MG tablet Take 1 tablet (8 mg total) by mouth every 8 (eight) hours as needed for nausea or  vomiting. 45 tablet 2  . PACLitaxel (TAXOL IV) Inject into the vein. Every 21 days    . prochlorperazine (COMPAZINE) 10 MG tablet Take 1 tablet (10 mg total) by mouth every 6 (six) hours as needed for nausea or vomiting. 30 tablet 2  . valACYclovir (VALTREX) 500 MG tablet Take 1 tablet (500 mg total) by mouth 2 (two) times daily. Start after completion of 1000 mg po tid 60 tablet 3   No current facility-administered medications on file prior to visit.      PAST SURGICAL HISTORY Past Surgical History:  Procedure Laterality Date  . CESAREAN SECTION    . LUMBAR FUSION  08/21/15   L3-L4 Dr. Timmothy Euler    FAMILY HISTORY: Family History  Problem Relation Age of Onset  . Lung cancer Mother     smoker; dx in her 24s  . Leukemia Father   . Diabetes Paternal Grandmother   . Heart attack Paternal Grandmother   . Diabetes Paternal Grandfather   . Breast cancer Paternal Aunt     dx in her 54s-30s  . Leukemia Paternal Uncle   . Heart attack Maternal Grandfather   . Breast cancer Cousin     maternal first cousin    SOCIAL HISTORY:  reports that she has never smoked. She has never used smokeless tobacco. She reports that she does not drink alcohol or use drugs.  Social History   Social History  . Marital status: Married    Spouse name: Gershon Mussel  . Number of children: 2  . Years of education: N/A   Social History Main Topics  . Smoking status: Never Smoker  . Smokeless tobacco: Never Used  . Alcohol use No  . Drug use: No  . Sexual activity: Not Asked   Other Topics Concern  . None   Social History Narrative  . None    PERFORMANCE STATUS: The patient's performance status is 1 - Symptomatic but completely ambulatory  PHYSICAL EXAM:  Vitals - 1 value per visit 60/05/3709  SYSTOLIC 626  DIASTOLIC 58  Pulse 95  Temperature 97.8  Respirations 20  Weight (lb) 213  Height   BMI 36.56  VISIT REPORT  General appearance: alert, cooperative, appears stated age, no  distress, moderately obese and accompanied by her husband.  Head: Normocephalic, without obvious abnormality, atraumatic Throat: lips, mucosa, and tongue normal; teeth and gums normal Neck: no adenopathy and supple, symmetrical, trachea midline Lungs: clear to auscultation bilaterally and normal percussion bilaterally Heart: regular rate and rhythm, S1, S2 normal, no murmur, click, rub or gallop Abdomen: abnormal findings:  ascites, distended, obese, insulin pump Extremities: chronic edema B/L LE edema, trace Skin: Skin color, texture, turgor normal. No rashes or lesions Lymph nodes: Cervical, supraclavicular, and axillary nodes normal. Neurologic: Grossly normal  LABORATORY DATA:  No results found for this or any previous visit (from the past 48 hour(s)).   Results for SEMA, STANGLER (MRN [id number]) as of 08/07/2016 09:01  Ref. Range 06/05/2016 09:19 06/09/2016 09:08 06/12/2016 09:56 06/26/2016 09:30 07/17/2016 08:49  CA 125 Latest Ref Range: 0.0 - 38.1 U/mL 12,359.0 (H)  12,650.0 (H) 8,811.0 (H) 5,202.0 (H)   Results for LORRENE, GRAEF (MRN [id number]) as of 08/09/2016 11:12  Ref. Range 08/07/2016 09:13  Sodium Latest Ref Range: 135 - 145 mmol/L 134 (L)  Potassium Latest Ref Range: 3.5 - 5.1 mmol/L 5.4 (H)  Chloride Latest Ref Range: 101 - 111 mmol/L 106  CO2 Latest Ref Range: 22 - 32 mmol/L 20 (L)  BUN Latest Ref Range: 6 - 20 mg/dL 37 (H)  Creatinine Latest Ref Range: 0.44 - 1.00 mg/dL 1.16 (H)  Calcium Latest Ref Range: 8.9 - 10.3 mg/dL 9.3  EGFR (Non-African Amer.) Latest Ref Range: >60 mL/min 49 (L)  EGFR (African American) Latest Ref Range: >60 mL/min 57 (L)  Glucose Latest Ref Range: 65 - 99 mg/dL 325 (H)  Anion gap Latest Ref Range: 5 - 15  8  Alkaline Phosphatase Latest Ref Range: 38 - 126 U/L 59  Albumin Latest Ref Range: 3.5 - 5.0 g/dL 3.6  AST Latest Ref Range: 15 - 41 U/L 21  ALT Latest Ref Range: 14 - 54 U/L 18  Total Protein Latest Ref Range: 6.5 - 8.1 g/dL 6.5  Total  Bilirubin Latest Ref Range: 0.3 - 1.2 mg/dL 0.4  WBC Latest Ref Range: 4.0 - 10.5 K/uL 4.4  RBC Latest Ref Range: 3.87 - 5.11 MIL/uL 2.60 (L)  Hemoglobin Latest Ref Range: 12.0 - 15.0 g/dL 8.5 (L)  HCT Latest Ref Range: 36.0 - 46.0 % 25.1 (L)  MCV Latest Ref Range: 78.0 - 100.0 fL 96.5  MCH Latest Ref Range: 26.0 - 34.0 pg 32.7  MCHC Latest Ref Range: 30.0 - 36.0 g/dL 33.9  RDW Latest Ref Range: 11.5 - 15.5 % 19.9 (H)  Platelets Latest Ref Range: 150 - 400 K/uL 133 (L)  Neutrophils Latest Units: % 92  Lymphocytes Latest Units: % 6  Monocytes Relative Latest Units: % 2  Eosinophil Latest Units: % 0  Basophil Latest Units: % 0  NEUT# Latest Ref Range: 1.7 - 7.7 K/uL 4.0  Lymphocyte # Latest Ref Range: 0.7 - 4.0 K/uL 0.3 (L)  Monocyte # Latest Ref Range: 0.1 - 1.0 K/uL 0.1  Eosinophils Absolute Latest Ref Range: 0.0 - 0.7 K/uL 0.0  Basophils Absolute Latest Ref Range: 0.0 - 0.1 K/uL 0.0  WBC Morphology Unknown INCREASED BANDS (...    RADIOGRAPHY: No results found.   I have reviewed the data as listed below.   Study Result   CLINICAL DATA:  Stage IIIC ovarian cancer diagnosed June 2017, presenting for restaging status post neoadjuvant systemic chemotherapy.  EXAM: CT CHEST, ABDOMEN, AND PELVIS  WITH CONTRAST  TECHNIQUE: Multidetector CT imaging of the chest, abdomen and pelvis was performed following the standard protocol during bolus administration of intravenous contrast.  CONTRAST:  23m ISOVUE-300 IOPAMIDOL (ISOVUE-300) INJECTION 61%  COMPARISON:  05/08/2016 chest CT and 04/29/2016 CT abdomen/ pelvis.  FINDINGS: CT CHEST FINDINGS  Mediastinum/Nodes: Normal heart size. No significant pericardial fluid/thickening. Right internal jugular MediPort terminates in the lower third of the superior vena cava. Left main coronary atherosclerosis. Mildly atherosclerotic nonaneurysmal thoracic aorta. Normal caliber pulmonary arteries. No central pulmonary emboli. Stable  mild multinodular goiter with stable peripherally calcified 0.8 cm left thyroid lobe nodule. No appreciable esophageal wall thickening. Mild oral contrast layering in the mid thoracic esophagus. No pathologically enlarged axillary, mediastinal or hilar lymph nodes. Stable coarsely calcified subcarinal and left hilar nodes from prior granulomatous disease.  Lungs/Pleura: No pneumothorax. No pleural effusion. No acute consolidative airspace disease, lung masses or significant pulmonary nodules.  Musculoskeletal: No aggressive appearing focal osseous lesions. Moderate thoracic spondylosis.  CT ABDOMEN PELVIS FINDINGS  Hepatobiliary: Normal liver size. Hypodense lateral segment left liver lobe 0.6 cm liver lesion (series 2/image 40) is too small to characterize and stable. No additional liver lesions. Normal gallbladder with no radiopaque cholelithiasis. No biliary ductal dilatation.  Pancreas: Normal, with no mass or duct dilation.  Spleen: Normal size spleen. Stable scattered granulomatous calcifications in the spleen. no splenic mass.  Adrenals/Urinary Tract: No discrete adrenal nodules. Nonobstructing 6 mm lower left renal stone. No hydronephrosis. No renal mass. Normal caliber ureters. Relatively collapsed and grossly normal bladder.  Stomach/Bowel: Grossly normal stomach. Normal caliber small bowel with no small bowel wall thickening. Normal appendix. Normal large bowel with no diverticulosis, large bowel wall thickening or pericolonic fat stranding.  Vascular/Lymphatic: Atherosclerotic nonaneurysmal abdominal aorta. Patent portal, splenic, hepatic and renal veins. No pathologically enlarged lymph nodes in the abdomen or pelvis.  Reproductive: Stable mildly enlarged uterus with the suggestion of a 4.0 cm right uterine fibroid. Asymmetrically enlarged right ovary measuring 3.2 x 2.0 cm (left ovary measures 2.3 x 1.5 cm), not appreciably changed.  Other: Small  volume ascites is significantly decreased. No pneumoperitoneum. Diffuse omental soft tissue caking is not appreciably changed. Diffuse peritoneal thickening along the pericolic gutters and bilateral pelvic peritoneal reflections, not appreciably changed.  Musculoskeletal: No aggressive appearing focal osseous lesions. Status post left lateral spinal fusion at L3-4. Moderate lumbar spondylosis, predominantly at L3-4.  IMPRESSION: 1. Small volume ascites, significantly decreased. 2. Stable diffuse omental soft tissue caking and diffuse peritoneal thickening along the bilateral paracolic gutters and bilateral pelvic peritoneal reflections, consistent with peritoneal carcinomatosis. 3. Stable asymmetrically enlarged right ovary, which may represent the primary site of ovarian malignancy. 4. No evidence of metastatic disease in the chest. No new sites of metastatic disease in the abdomen or pelvis. 5. Additional findings include aortic atherosclerosis, left main coronary atherosclerosis, nonobstructing left renal stone and probable uterine fibroid.   Electronically Signed   By: JIlona SorrelM.D.   On: 07/08/2016 12:55       PATHOLOGY:    ASSESSMENT/PLAN:  Stage IIIC ovarian/primary peritoneal/fallopian tube cancer High Grade Ascites Carcinomatosis Shingles  Glaucoma Patient just diagnosed with glaucoma. Has appointment with a retina specialist next Wednesday.   Ovarian cancer ([place] Ovarian cancer/primary peritoneal/fallopian tube cancer, clinically and radiographically Stage IIIC disease with positive malignant ascites for metastatic high grade serous carcinoma on peritoneal fluid on 05/01/2016.  Started on systemic chemotherapy on 05/15/2016 consisting of Carboplatin/Paclitaxel after evaluation by Gyn Onc. Re-evaluation following cycle #  3 with imaging demonstrated only a minimal response to therapy.  Gyn Onc recommended continued systemic therapy for a total of 6 cycles  with re-evaluation of response following.  Pre-treatment labs today: CBC diff, CMET, CA 125.  I personally reviewed and went over laboratory results with the patient.  The results are noted within this dictation.    She has been seen by Dr. Denman George on 07/09/2016: I reviewed her CT images from 07/04/16 with the patient. She has had minimal response on imaging. There is resolution of ascites, but upper abdominal omental disease remains stable. Her CA 125 has also minimally responded.  I am recommending an additional 3 cycles of carboplatin and paclitaxel prior to reassessment with reimaging. At that time, if she has had a more substantial response we will consider an interval cytoreductive effort.  She is doing well and denies any new complaints today.  She understands the reason for not undergoing surgery following cycle #3 and she is understands the concern regarding minimal response following 3 cycles.  Clinically, she is much improved compared to the start of therapy.    No medication refills requested today.   I have ordered CT imaging after cycle #6 of therapy, I have also arranged for follow-up with Dr. Denman George.   Return in 3 weeks for follow-up and next cycle of chemotherapy.  Shingles Resolved. No evidence of active infection. She notes occasional tingling at prior site of infection but no significant pain.  Genetic testing Negative genetic testing on the breast/ovarian cancer panel.  The Breast/Ovarian gene panel offered by [facility] includes sequencing and rearrangement analysis for the following 20 genes:  ATM, BARD1, BRCA1, BRCA2, BRIP1, CDH1, CHEK2, EPCAM, FANCC, MLH1, MSH2, MSH6, NBN, PALB2, PMS2, PTEN, RAD51C, RAD51D, TP53, and XRCC2.   The report date is June 25, 2016.   Chemotherapy-induced neuropathy (HCC) Minimal. No need for dose reduction or modification of taxol dosing at this point. Will continue to monitor.   Antineoplastic chemotherapy induced anemia Would currently follow  and transfuse as needed. Since she is close to completing recommended chemotherapy would hold off on institution of growth factors at this point. Will add B12, folate and iron studies at follow-up.  Orders Placed This Encounter  Procedures  . CT Chest W Contrast    Standing Status:   Future    Standing Expiration Date:   08/07/2017    Order Specific Question:   If indicated for the ordered procedure, I authorize the administration of contrast media per Radiology protocol    Answer:   Yes    Order Specific Question:   Reason for Exam (SYMPTOM  OR DIAGNOSIS REQUIRED)    Answer:   restaging ovarian carcinoma    Order Specific Question:   Preferred imaging location?    Answer:   [facility]  . CT Abdomen Pelvis W Contrast    Standing Status:   Future    Standing Expiration Date:   08/07/2017    Order Specific Question:   If indicated for the ordered procedure, I authorize the administration of contrast media per Radiology protocol    Answer:   Yes    Order Specific Question:   Reason for Exam (SYMPTOM  OR DIAGNOSIS REQUIRED)    Answer:   restaging ovarian carcinoma    Order Specific Question:   Preferred imaging location?    Answer:   [place] [address]    Standing Status:   Future    Standing Expiration Date:  08/07/2017    All questions were answered. The patient knows to call the clinic with any problems, questions or concerns. We can certainly see the patient much sooner if necessary.  This document serves as a record of services personally performed by Ancil Linsey, MD. It was created on her behalf by Elmyra Ricks, a trained medical scribe. The creation of this record is based on the scribe's personal observations and the provider's statements to them. This document has been checked and approved by the attending provider.  I have reviewed the above documentation for accuracy and completeness and I agree with the above.  This note is electronically signed  Molli Hazard, MD  08/09/2016 11:14 AM

## 2016-08-07 NOTE — Progress Notes (Signed)
Labs reviewed with Dr. Whitney Muse. Will proceed with treatment.

## 2016-08-07 NOTE — Progress Notes (Signed)
Tolerated chemo well. Tolerated blood transfusion well. Stable on discharge home with husband via wheelchair.

## 2016-08-07 NOTE — Patient Instructions (Signed)
[facility] Discharge Instructions for Patients Receiving Chemotherapy   Beginning January 23rd 2017 lab work for the [facility] will be done in the  Main lab at [facility] on 1st floor. If you have a lab appointment with the [place] please come in thru the  Main Entrance and check in at the main information desk   Today you received the following chemotherapy agents Taxol and Carbo. You also received a blood transfusion of packed red blood cells.  To help prevent nausea and vomiting after your treatment, we encourage you to take your nausea medication as instructed.   If you develop nausea and vomiting, or diarrhea that is not controlled by your medication, call the clinic.  The clinic phone number is [phone]. Office hours are Monday-Friday 8:30am-5:00pm.  BELOW ARE SYMPTOMS THAT SHOULD BE REPORTED IMMEDIATELY:  *FEVER GREATER THAN 101.0 F  *CHILLS WITH OR WITHOUT FEVER  NAUSEA AND VOMITING THAT IS NOT CONTROLLED WITH YOUR NAUSEA MEDICATION  *UNUSUAL SHORTNESS OF BREATH  *UNUSUAL BRUISING OR BLEEDING  TENDERNESS IN MOUTH AND THROAT WITH OR WITHOUT PRESENCE OF ULCERS  *URINARY PROBLEMS  *BOWEL PROBLEMS  UNUSUAL RASH Items with * indicate a potential emergency and should be followed up as soon as possible. If you have an emergency after office hours please contact your primary care physician or go to the nearest emergency department.  Please call the clinic during office hours if you have any questions or concerns.   You may also contact the Patient Navigator at [phone] should you have any questions or need assistance in obtaining follow up care.      Resources For Cancer Patients and their Caregivers ? American Cancer Society: Can assist with transportation, wigs, general needs, runs Look Good Feel Better.        619-522-0866 ? Cancer Care: Provides financial assistance, online support groups, medication/co-pay assistance.   1-800-813-HOPE 870-333-3475) ? [facility] Assists [place] cancer patients and their families through emotional , educational and financial support.  2131951487 ? [place] DSS Where to apply for food stamps, Medicaid and utility assistance. (540) 307-8627 ? [facility]: Transportation to medical appointments. 4580717386 ? Social Security Administration: May apply for disability if have a Stage IV cancer. 272-812-9713 5187677877 ? [employer], Disability and Transit Services: Assists with nutrition, care and transit needs. [phone]

## 2016-08-07 NOTE — Assessment & Plan Note (Signed)
Patient just diagnosed with glaucoma. Has appointment with a retina specialist next Wednesday.

## 2016-08-08 ENCOUNTER — Encounter (HOSPITAL_COMMUNITY): Payer: BC Managed Care – PPO

## 2016-08-08 LAB — TYPE AND SCREEN
ABO/RH(D): O POS
Antibody Screen: NEGATIVE
Unit division: 0

## 2016-08-08 LAB — CA 125: CA 125: 2751 U/mL — AB (ref 0.0–38.1)

## 2016-08-09 DIAGNOSIS — D6481 Anemia due to antineoplastic chemotherapy: Secondary | ICD-10-CM | POA: Insufficient documentation

## 2016-08-09 DIAGNOSIS — T451X5A Adverse effect of antineoplastic and immunosuppressive drugs, initial encounter: Secondary | ICD-10-CM | POA: Insufficient documentation

## 2016-08-09 NOTE — Assessment & Plan Note (Signed)
Negative genetic testing on the breast/ovarian cancer panel.  The Breast/Ovarian gene panel offered by [facility] includes sequencing and rearrangement analysis for the following 20 genes:  ATM, BARD1, BRCA1, BRCA2, BRIP1, CDH1, CHEK2, EPCAM, FANCC, MLH1, MSH2, MSH6, NBN, PALB2, PMS2, PTEN, RAD51C, RAD51D, TP53, and XRCC2.   The report date is June 25, 2016.  

## 2016-08-09 NOTE — Assessment & Plan Note (Signed)
Minimal. No need for dose reduction or modification of taxol dosing at this point. Will continue to monitor.

## 2016-08-09 NOTE — Assessment & Plan Note (Signed)
Would currently follow and transfuse as needed. Since she is close to completing recommended chemotherapy would hold off on institution of growth factors at this point. Will add B12, folate and iron studies at follow-up.

## 2016-08-09 NOTE — Assessment & Plan Note (Signed)
Resolved. No evidence of active infection. She notes occasional tingling at prior site of infection but no significant pain.

## 2016-08-13 ENCOUNTER — Other Ambulatory Visit: Payer: Self-pay | Admitting: Pharmacist

## 2016-08-13 ENCOUNTER — Telehealth (HOSPITAL_COMMUNITY): Payer: Self-pay | Admitting: *Deleted

## 2016-08-18 ENCOUNTER — Telehealth: Payer: Self-pay | Admitting: Family Medicine

## 2016-08-18 MED ORDER — METFORMIN HCL 1000 MG PO TABS: ORAL_TABLET | ORAL | 1 refills | Status: DC

## 2016-08-18 NOTE — Telephone Encounter (Signed)
Patient requested rx sent in for metformin for 30 day supply only for the next 2 mont.  Rx sent to [facility] at patient's request.

## 2016-08-22 NOTE — Telephone Encounter (Signed)
Called patient and left message to call back if she still needed labs results.

## 2016-08-28 ENCOUNTER — Encounter (HOSPITAL_BASED_OUTPATIENT_CLINIC_OR_DEPARTMENT_OTHER): Payer: BC Managed Care – PPO | Admitting: Hematology & Oncology

## 2016-08-28 ENCOUNTER — Encounter (HOSPITAL_COMMUNITY): Payer: Self-pay | Admitting: Hematology & Oncology

## 2016-08-28 ENCOUNTER — Ambulatory Visit (HOSPITAL_COMMUNITY): Payer: BC Managed Care – PPO | Admitting: Hematology & Oncology

## 2016-08-28 ENCOUNTER — Encounter (HOSPITAL_BASED_OUTPATIENT_CLINIC_OR_DEPARTMENT_OTHER): Payer: BC Managed Care – PPO

## 2016-08-28 ENCOUNTER — Ambulatory Visit (HOSPITAL_COMMUNITY): Payer: BC Managed Care – PPO

## 2016-08-28 VITALS — BP 133/72 | HR 90 | Temp 98.1°F | Resp 18

## 2016-08-28 VITALS — BP 132/52 | HR 95 | Temp 97.7°F | Resp 18 | Wt 212.5 lb

## 2016-08-28 DIAGNOSIS — C569 Malignant neoplasm of unspecified ovary: Secondary | ICD-10-CM | POA: Diagnosis not present

## 2016-08-28 DIAGNOSIS — B029 Zoster without complications: Secondary | ICD-10-CM | POA: Diagnosis not present

## 2016-08-28 DIAGNOSIS — D649 Anemia, unspecified: Secondary | ICD-10-CM

## 2016-08-28 DIAGNOSIS — Z5111 Encounter for antineoplastic chemotherapy: Secondary | ICD-10-CM | POA: Diagnosis not present

## 2016-08-28 DIAGNOSIS — G620 Drug-induced polyneuropathy: Secondary | ICD-10-CM

## 2016-08-28 DIAGNOSIS — T451X5A Adverse effect of antineoplastic and immunosuppressive drugs, initial encounter: Secondary | ICD-10-CM

## 2016-08-28 DIAGNOSIS — H409 Unspecified glaucoma: Secondary | ICD-10-CM

## 2016-08-28 DIAGNOSIS — D6481 Anemia due to antineoplastic chemotherapy: Secondary | ICD-10-CM | POA: Diagnosis not present

## 2016-08-28 DIAGNOSIS — R935 Abnormal findings on diagnostic imaging of other abdominal regions, including retroperitoneum: Secondary | ICD-10-CM | POA: Diagnosis not present

## 2016-08-28 DIAGNOSIS — D7589 Other specified diseases of blood and blood-forming organs: Secondary | ICD-10-CM

## 2016-08-28 LAB — CBC WITH DIFFERENTIAL/PLATELET
BASOS PCT: 1 %
Basophils Absolute: 0 10*3/uL (ref 0.0–0.1)
Eosinophils Absolute: 0 10*3/uL (ref 0.0–0.7)
Eosinophils Relative: 0 %
HEMATOCRIT: 25.6 % — AB (ref 36.0–46.0)
HEMOGLOBIN: 8.4 g/dL — AB (ref 12.0–15.0)
LYMPHS PCT: 6 %
Lymphs Abs: 0.4 10*3/uL — ABNORMAL LOW (ref 0.7–4.0)
MCH: 33.1 pg (ref 26.0–34.0)
MCHC: 32.8 g/dL (ref 30.0–36.0)
MCV: 100.8 fL — AB (ref 78.0–100.0)
MONO ABS: 0.3 10*3/uL (ref 0.1–1.0)
MONOS PCT: 5 %
NEUTROS ABS: 4.9 10*3/uL (ref 1.7–7.7)
NEUTROS PCT: 88 %
Platelets: 153 10*3/uL (ref 150–400)
RBC: 2.54 MIL/uL — ABNORMAL LOW (ref 3.87–5.11)
RDW: 17.8 % — AB (ref 11.5–15.5)
WBC: 5.6 10*3/uL (ref 4.0–10.5)

## 2016-08-28 LAB — COMPREHENSIVE METABOLIC PANEL
ALBUMIN: 3.7 g/dL (ref 3.5–5.0)
ALK PHOS: 57 U/L (ref 38–126)
ALT: 16 U/L (ref 14–54)
ANION GAP: 10 (ref 5–15)
AST: 19 U/L (ref 15–41)
BUN: 31 mg/dL — ABNORMAL HIGH (ref 6–20)
CALCIUM: 9.3 mg/dL (ref 8.9–10.3)
CHLORIDE: 102 mmol/L (ref 101–111)
CO2: 23 mmol/L (ref 22–32)
Creatinine, Ser: 0.98 mg/dL (ref 0.44–1.00)
GFR calc non Af Amer: >60 mL/min (ref >60)
GLUCOSE: 233 mg/dL — AB (ref 65–99)
POTASSIUM: 4.8 mmol/L (ref 3.5–5.1)
SODIUM: 135 mmol/L (ref 135–145)
Total Bilirubin: 0.5 mg/dL (ref 0.3–1.2)
Total Protein: 6.7 g/dL (ref 6.5–8.1)

## 2016-08-28 LAB — VITAMIN B12: Vitamin B-12: 146 pg/mL — ABNORMAL LOW (ref 180–914)

## 2016-08-28 LAB — FOLATE: Folate: 34.8 ng/mL (ref >5.9)

## 2016-08-28 MED ORDER — FAMOTIDINE IN NACL 20-0.9 MG/50ML-% IV SOLN
20.0000 mg | Freq: Once | INTRAVENOUS | Status: AC
  Administered 2016-08-28: 20 mg via INTRAVENOUS
  Filled 2016-08-28: qty 50

## 2016-08-28 MED ORDER — FUROSEMIDE 20 MG PO TABS: 20.0000 mg | ORAL_TABLET | Freq: Every day | ORAL | 1 refills | Status: DC

## 2016-08-28 MED ORDER — DEXAMETHASONE SODIUM PHOSPHATE 10 MG/ML IJ SOLN: 10.0000 mg | Freq: Once | INTRAMUSCULAR | Status: DC

## 2016-08-28 MED ORDER — HEPARIN SOD (PORK) LOCK FLUSH 100 UNIT/ML IV SOLN
500.0000 [IU] | Freq: Once | INTRAVENOUS | Status: AC | PRN
  Administered 2016-08-28: 500 [IU]

## 2016-08-28 MED ORDER — PALONOSETRON HCL INJECTION 0.25 MG/5ML
0.2500 mg | Freq: Once | INTRAVENOUS | Status: AC
  Administered 2016-08-28: 0.25 mg via INTRAVENOUS
  Filled 2016-08-28: qty 5

## 2016-08-28 MED ORDER — HEPARIN SOD (PORK) LOCK FLUSH 100 UNIT/ML IV SOLN
INTRAVENOUS | Status: AC
  Filled 2016-08-28: qty 5

## 2016-08-28 MED ORDER — SODIUM CHLORIDE 0.9 % IV SOLN
20.0000 mg | Freq: Once | INTRAVENOUS | Status: AC
  Administered 2016-08-28: 20 mg via INTRAVENOUS
  Filled 2016-08-28: qty 2

## 2016-08-28 MED ORDER — DIPHENHYDRAMINE HCL 50 MG/ML IJ SOLN
50.0000 mg | Freq: Once | INTRAMUSCULAR | Status: AC
  Administered 2016-08-28: 50 mg via INTRAVENOUS
  Filled 2016-08-28: qty 1

## 2016-08-28 MED ORDER — SODIUM CHLORIDE 0.9 % IV SOLN
687.6000 mg | Freq: Once | INTRAVENOUS | Status: AC
  Administered 2016-08-28: 690 mg via INTRAVENOUS
  Filled 2016-08-28: qty 60

## 2016-08-28 MED ORDER — HYDROCODONE-ACETAMINOPHEN 10-325 MG PO TABS: 1.0000 | ORAL_TABLET | ORAL | 0 refills | Status: DC | PRN

## 2016-08-28 MED ORDER — PACLITAXEL CHEMO INJECTION 300 MG/50ML
175.0000 mg/m2 | Freq: Once | INTRAVENOUS | Status: AC
  Administered 2016-08-28: 366 mg via INTRAVENOUS
  Filled 2016-08-28: qty 50

## 2016-08-28 MED ORDER — SODIUM CHLORIDE 0.9 % IV SOLN
Freq: Once | INTRAVENOUS | Status: AC
  Administered 2016-08-28: 10:00:00 via INTRAVENOUS

## 2016-08-28 MED ORDER — SODIUM CHLORIDE 0.9 % IV SOLN: 20.0000 mg | Freq: Once | INTRAVENOUS | Status: DC

## 2016-08-28 MED ORDER — SODIUM CHLORIDE 0.9% FLUSH
10.0000 mL | INTRAVENOUS | Status: DC | PRN
  Administered 2016-08-28: 10 mL
  Filled 2016-08-28: qty 10

## 2016-08-28 NOTE — Progress Notes (Signed)
Tolerated tx w/o adverse reaction.  Alert, in no distress.  VSS.  Discharged ambulatory in c/o spouse.  

## 2016-08-28 NOTE — Progress Notes (Signed)
[facility] Hematology/Oncology Progress Note   Name: Ellen Hunt      MRN: [id number]    Date: 08/28/2016 Time:12:09 PM   REFERRING PHYSICIAN:  Milton Ferguson, MD (ED physcian)   DIAGNOSIS:  Ovarian Carcinoma    Ovarian cancer ([place])   04/29/2016 Imaging    CT abd/pelvis- Extensive omental caking as well as moderate amount of ascites within the abdomen most compatible with peritoneal metastatic disease, of unknown primary. This may potentially be ovarian or a GI in etiology.      04/30/2016 Tumor Marker    CA 125- 7149.0 (H)      05/01/2016 Procedure    US paracentesis- Successful ultrasound-guided paracentesis yielding 1.8 liters of peritoneal fluid.      05/01/2016 Imaging    US pelvis- Both transabdominal and transvaginal sonography are significantly limited by large patient habitus and ascites. Neither uterus or ovaries were visualized on this exam.      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      05/08/2016 Imaging    CT chest- No evidence of metastatic disease in the chest. Peritoneal/omental disease with abdominal ascites in the upper abdomen, incompletely visualized.       05/13/2016 Procedure    Placement of single lumen port a cath via right internal jugular vein. The catheter tip lies at the cavoatrial junction. A power injectable port a cath was placed and is ready for immediate use.      05/15/2016 Procedure    US Paracentesis- 3400 ml yellow colored ascites removed      05/15/2016 - 06/26/2016 Chemotherapy    Carboplatin/Paclitaxel every 21 days x 3 cycles      07/01/2016 Miscellaneous    Genetic Counseling by [name]-  Genetic testing was normal, and did not reveal a deleterious mutation in these genes.       07/08/2016 Imaging    CT CAP- 1. Small volume ascites, significantly decreased. 2. Stable diffuse  omental soft tissue caking and diffuse peritoneal thickening along the bilateral paracolic gutters and bilateral pelvic peritoneal reflections, consistent with peritoneal carcinomatosis. 3. Stable asymmetrically enlarged right ovary, which may represent the primary site of ovarian malignancy. 4. No evidence of metastatic disease in the chest. No new sites of metastatic disease in the abdomen or pelvis.      07/09/2016 Miscellaneous    Gyn Onc re-evaluation- modest response to therapy, 3 more cycles of chemotherapy recommended.        07/17/2016 -  Chemotherapy    The patient had palonosetron (ALOXI) injection 0.25 mg, 0.25 mg, Intravenous,  Once, 3 of 6 cycles  CARBOplatin (PARAPLATIN) 500 mg in sodium chloride 0.9 % 250 mL chemo infusion, 500 mg (100 % of original dose 496 mg), Intravenous,  Once, 3 of 6 cycles Dose modification:   (original dose 496 mg, Cycle 1), 750 mg (original dose 496 mg, Cycle 3, Reason: Provider Judgment),   (original dose 496 mg, Cycle 4, Reason: Change in SCr/CrCl)  PACLitaxel (TAXOL) 396 mg in dextrose 5 % 500 mL chemo infusion (> 80mg /m2), 175 mg/m2 = 396 mg, Intravenous,  Once, 3 of 6 cycles  for chemotherapy treatment.          HISTORY OF PRESENT ILLNESS:   Ellen Hunt is a 65 y.o. female with a diagnosis of ovarian carcinoma. She presents today for ongoing follow-up. She will finish  cycle #6 of carboplatin/taxol today. She realistically has no major complaints. She is nervous about surgery. She hopes that her upcoming scans are improved.   Patient is accompanied by her husband. She reports constipation and pain in her fingers. She describes the pain as an aching sensation, more in her joints. Patient takes hydrocodone and says its alleviates her pain.    No nausea or vomiting. No abdominal pain.  Blood sugars have been ok.  Intermittent residual shingles pain.  Review of Systems  Constitutional: Negative for chills, fever, malaise/fatigue and  weight loss.  HENT: Negative.   Respiratory: Positive for shortness of breath. Negative for cough, sputum production and wheezing.   Cardiovascular: Positive for leg swelling (B/L).  Gastrointestinal: Positive for abdominal pain and constipation. Negative for nausea and vomiting.  Genitourinary: Negative.   Musculoskeletal: Positive for joint pain.       Pain in fingers  Skin: Negative.   Neurological: Negative.   Endo/Heme/Allergies: Negative.   Psychiatric/Behavioral: Negative.    14 point review of systems was performed and is negative except as detailed under history of present illness and above  PAST MEDICAL HISTORY:   Past Medical History:  Diagnosis Date  . Abnormal CT of the abdomen 04/30/2016  . Diabetes mellitus without complication ([place])   . Family history of breast cancer   . Hyperlipidemia   . Hypertension   . Low serum vitamin D   . Ovarian cancer ([place]) 05/09/2016    ALLERGIES: Allergies  Allergen Reactions  . Avandia [Rosiglitazone] Other (See Comments)    Legs swelled  . Micronase [Glyburide] Swelling  . Actos [Pioglitazone] Other (See Comments)    Edema / leg swelling      MEDICATIONS: I have reviewed the patient's current medications.    Current Outpatient Prescriptions on File Prior to Visit  Medication Sig Dispense Refill  . aspirin 81 MG tablet Take 1 tablet (81 mg total) by mouth daily. (Patient taking differently: Take 81 mg by mouth every evening. ) 30 tablet   . atorvastatin (LIPITOR) 40 MG tablet TAKE 1 TABLET BY MOUTH EVERY DAY 90 tablet 0  . benazepril (LOTENSIN) 10 MG tablet TAKE 1 TABLET BY MOUTH EVERY DAY 90 tablet 0  . CARBOPLATIN IV Inject into the vein. Every 21 days    . cephALEXin (KEFLEX) 500 MG capsule Take 2 capsules (1,000 mg total) by mouth 2 (two) times daily. 20 capsule 0  . dexamethasone (DECADRON) 4 MG tablet The day before chemo take 5 tabs (20mg  total) in the am and 5 tabs (20mg  total) in the pm. The morning of chemo take 5  tabs (20mg  total). 45 tablet 1  . diazepam (VALIUM) 5 MG tablet Take 0.5 tablets (2.5 mg total) by mouth every 4 (four) hours as needed (spasms). 30 tablet 1  . glucagon (GLUCAGON EMERGENCY) 1 MG injection Inject as directed as needed for low blood glucose if unresponsive. 1 each 0  . glucosamine-chondroitin 500-400 MG tablet Take 1 tablet by mouth 2 (two) times daily.    Marland Kitchen glucose blood (ONE TOUCH ULTRA TEST) test strip USE TO CHECK BLOOD SUGAR UP TO 5 TIMES A DAY 450 each 2  . lidocaine-prilocaine (EMLA) cream Apply a quarter size amount to port site 1 hour prior to chemo. Do not rub in. Cover with plastic wrap. 30 g 3  . metFORMIN (GLUCOPHAGE) 1000 MG tablet TAKE 1 TABLET BY MOUTH TWICE DAILY WITH A MEAL 60 tablet 1  . Multiple Vitamin (MULTIVITAMIN WITH MINERALS)  TABS Take 1 tablet by mouth daily.    Marland Kitchen NOVOLOG 100 UNIT/ML injection USE IN INSULIN PUMP AS DIRECTED (MAX DAILY DOSE IS 110 UNITS PER DAY) 40 mL 1  . omeprazole (PRILOSEC) 40 MG capsule Take 1 capsule (40 mg total) by mouth daily. 30 capsule 3  . ondansetron (ZOFRAN) 8 MG tablet Take 1 tablet (8 mg total) by mouth every 8 (eight) hours as needed for nausea or vomiting. 45 tablet 2  . PACLitaxel (TAXOL IV) Inject into the vein. Every 21 days    . prochlorperazine (COMPAZINE) 10 MG tablet Take 1 tablet (10 mg total) by mouth every 6 (six) hours as needed for nausea or vomiting. 30 tablet 2  . valACYclovir (VALTREX) 500 MG tablet Take 1 tablet (500 mg total) by mouth 2 (two) times daily. Start after completion of 1000 mg po tid 60 tablet 3   No current facility-administered medications on file prior to visit.      PAST SURGICAL HISTORY Past Surgical History:  Procedure Laterality Date  . CESAREAN SECTION    . LUMBAR FUSION  08/21/15   L3-L4 Dr. Timmothy Euler    FAMILY HISTORY: Family History  Problem Relation Age of Onset  . Lung cancer Mother     smoker; dx in her 43s  . Leukemia Father   . Diabetes Paternal Grandmother     . Heart attack Paternal Grandmother   . Diabetes Paternal Grandfather   . Breast cancer Paternal Aunt     dx in her 48s-30s  . Leukemia Paternal Uncle   . Heart attack Maternal Grandfather   . Breast cancer Cousin     maternal first cousin    SOCIAL HISTORY:  reports that she has never smoked. She has never used smokeless tobacco. She reports that she does not drink alcohol or use drugs.  Social History   Social History  . Marital status: Married    Spouse name: Gershon Mussel  . Number of children: 2  . Years of education: N/A   Social History Main Topics  . Smoking status: Never Smoker  . Smokeless tobacco: Never Used  . Alcohol use No  . Drug use: No  . Sexual activity: Not Asked   Other Topics Concern  . None   Social History Narrative  . None    PERFORMANCE STATUS: The patient's performance status is 1 - Symptomatic but completely ambulatory  PHYSICAL EXAM: Vitals with BMI 08/28/2016  Height   Weight 212 lbs 8 oz  BMI   Systolic Q000111Q  Diastolic 52  Pulse 95  Respirations 18    General appearance: alert, cooperative, appears stated age, no distress, moderately obese and accompanied by her husband.  Head: Normocephalic, without obvious abnormality, atraumatic Throat: lips, mucosa, and tongue normal; teeth and gums normal Neck: no adenopathy and supple, symmetrical, trachea midline Lungs: clear to auscultation bilaterally and normal percussion bilaterally Heart: regular rate and rhythm, S1, S2 normal, no murmur, click, rub or gallop Abdomen: abnormal findings:  ascites, distended, obese, insulin pump Extremities: chronic edema B/L LE edema, trace Skin: Skin color, texture, turgor normal. No rashes or lesions Lymph nodes: Cervical, supraclavicular, and axillary nodes normal. Neurologic: Grossly normal  LABORATORY DATA:  Results for LAKEYSHA, LAFAZIA (MRN [id number]) as of 08/28/2016 18:45  Ref. Range 06/05/2016 09:19 06/12/2016 09:56 06/26/2016 09:30 07/17/2016 08:49  08/07/2016 09:13  CA 125 Latest Ref Range: 0.0 - 38.1 U/mL 12,359.0 (H) 12,650.0 (H) 8,811.0 (H) 5,202.0 (H) 2,751.0 (H)    Results for  KANI, MONA (MRN [id number]) as of 08/28/2016 12:08  Ref. Range 08/28/2016 09:21  WBC Latest Ref Range: 4.0 - 10.5 K/uL 5.6  RBC Latest Ref Range: 3.87 - 5.11 MIL/uL 2.54 (L)  Hemoglobin Latest Ref Range: 12.0 - 15.0 g/dL 8.4 (L)  HCT Latest Ref Range: 36.0 - 46.0 % 25.6 (L)  MCV Latest Ref Range: 78.0 - 100.0 fL 100.8 (H)  MCH Latest Ref Range: 26.0 - 34.0 pg 33.1  MCHC Latest Ref Range: 30.0 - 36.0 g/dL 32.8  RDW Latest Ref Range: 11.5 - 15.5 % 17.8 (H)  Platelets Latest Ref Range: 150 - 400 K/uL 153  Neutrophils Latest Units: % 88  Lymphocytes Latest Units: % 6  Monocytes Relative Latest Units: % 5  Eosinophil Latest Units: % 0  Basophil Latest Units: % 1  NEUT# Latest Ref Range: 1.7 - 7.7 K/uL 4.9  Lymphocyte # Latest Ref Range: 0.7 - 4.0 K/uL 0.4 (L)  Monocyte # Latest Ref Range: 0.1 - 1.0 K/uL 0.3  Eosinophils Absolute Latest Ref Range: 0.0 - 0.7 K/uL 0.0  Basophils Absolute Latest Ref Range: 0.0 - 0.1 K/uL 0.0   RADIOGRAPHY: No results found.   I have reviewed the data as listed below.   Study Result   CLINICAL DATA:  Stage IIIC ovarian cancer diagnosed June 2017, presenting for restaging status post neoadjuvant systemic chemotherapy.  EXAM: CT CHEST, ABDOMEN, AND PELVIS WITH CONTRAST  TECHNIQUE: Multidetector CT imaging of the chest, abdomen and pelvis was performed following the standard protocol during bolus administration of intravenous contrast.  CONTRAST:  34mL ISOVUE-300 IOPAMIDOL (ISOVUE-300) INJECTION 61%  COMPARISON:  05/08/2016 chest CT and 04/29/2016 CT abdomen/ pelvis.  FINDINGS: CT CHEST FINDINGS  Mediastinum/Nodes: Normal heart size. No significant pericardial fluid/thickening. Right internal jugular MediPort terminates in the lower third of the superior vena cava. Left main coronary atherosclerosis.  Mildly atherosclerotic nonaneurysmal thoracic aorta. Normal caliber pulmonary arteries. No central pulmonary emboli. Stable mild multinodular goiter with stable peripherally calcified 0.8 cm left thyroid lobe nodule. No appreciable esophageal wall thickening. Mild oral contrast layering in the mid thoracic esophagus. No pathologically enlarged axillary, mediastinal or hilar lymph nodes. Stable coarsely calcified subcarinal and left hilar nodes from prior granulomatous disease.  Lungs/Pleura: No pneumothorax. No pleural effusion. No acute consolidative airspace disease, lung masses or significant pulmonary nodules.  Musculoskeletal: No aggressive appearing focal osseous lesions. Moderate thoracic spondylosis.  CT ABDOMEN PELVIS FINDINGS  Hepatobiliary: Normal liver size. Hypodense lateral segment left liver lobe 0.6 cm liver lesion (series 2/image 40) is too small to characterize and stable. No additional liver lesions. Normal gallbladder with no radiopaque cholelithiasis. No biliary ductal dilatation.  Pancreas: Normal, with no mass or duct dilation.  Spleen: Normal size spleen. Stable scattered granulomatous calcifications in the spleen. no splenic mass.  Adrenals/Urinary Tract: No discrete adrenal nodules. Nonobstructing 6 mm lower left renal stone. No hydronephrosis. No renal mass. Normal caliber ureters. Relatively collapsed and grossly normal bladder.  Stomach/Bowel: Grossly normal stomach. Normal caliber small bowel with no small bowel wall thickening. Normal appendix. Normal large bowel with no diverticulosis, large bowel wall thickening or pericolonic fat stranding.  Vascular/Lymphatic: Atherosclerotic nonaneurysmal abdominal aorta. Patent portal, splenic, hepatic and renal veins. No pathologically enlarged lymph nodes in the abdomen or pelvis.  Reproductive: Stable mildly enlarged uterus with the suggestion of a 4.0 cm right uterine fibroid.  Asymmetrically enlarged right ovary measuring 3.2 x 2.0 cm (left ovary measures 2.3 x 1.5 cm), not appreciably changed.  Other: Small volume  ascites is significantly decreased. No pneumoperitoneum. Diffuse omental soft tissue caking is not appreciably changed. Diffuse peritoneal thickening along the pericolic gutters and bilateral pelvic peritoneal reflections, not appreciably changed.  Musculoskeletal: No aggressive appearing focal osseous lesions. Status post left lateral spinal fusion at L3-4. Moderate lumbar spondylosis, predominantly at L3-4.  IMPRESSION: 1. Small volume ascites, significantly decreased. 2. Stable diffuse omental soft tissue caking and diffuse peritoneal thickening along the bilateral paracolic gutters and bilateral pelvic peritoneal reflections, consistent with peritoneal carcinomatosis. 3. Stable asymmetrically enlarged right ovary, which may represent the primary site of ovarian malignancy. 4. No evidence of metastatic disease in the chest. No new sites of metastatic disease in the abdomen or pelvis. 5. Additional findings include aortic atherosclerosis, left main coronary atherosclerosis, nonobstructing left renal stone and probable uterine fibroid.   Electronically Signed   By: Ilona Sorrel M.D.   On: 07/08/2016 12:55       PATHOLOGY:    ASSESSMENT/PLAN:  Stage IIIC ovarian/primary peritoneal/fallopian tube cancer High Grade Ascites Carcinomatosis Shingles Chemotherapy induced anemia Macrocytosis Constipation  She will complete cycle #6 of carboplatin/taxol today. CT scans are arranged in the next several weeks. She has follow-up with Dr. Denman George on 11/15. I will see her back post.   I have asked her to take Miralax daily for her constipation.  B12 and folate have been added to labs today given her ongoing anemia and macrocytosis. She will be transfused 1 U prbc. I suspect the majority of her anemia is chemotherapy related.    Requested medications were refilled.   Meds ordered this encounter  Medications  . furosemide (LASIX) 20 MG tablet    Sig: Take 1 tablet (20 mg total) by mouth daily.    Dispense:  90 tablet    Refill:  1  . HYDROcodone-acetaminophen (NORCO) 10-325 MG tablet    Sig: Take 1 tablet by mouth every 4 (four) hours as needed.    Dispense:  90 tablet    Refill:  0    Orders Placed This Encounter  Procedures  . Type and screen    Standing Status:   Future    Standing Expiration Date:   08/28/2017  . Prepare RBC    Standing Status:   Standing    Number of Occurrences:   1    Order Specific Question:   # of Units    Answer:   2 units    Order Specific Question:   Transfusion Indications    Answer:   Symptomatic Anemia    Order Specific Question:   If emergent release call blood bank    Answer:   Not emergent release    All questions were answered. The patient knows to call the clinic with any problems, questions or concerns. We can certainly see the patient much sooner if necessary.  This document serves as a record of services personally performed by Ancil Linsey, MD. It was created on her behalf by Elmyra Ricks, a trained medical scribe. The creation of this record is based on the scribe's personal observations and the provider's statements to them. This document has been checked and approved by the attending provider.  I have reviewed the above documentation for accuracy and completeness and I agree with the above.  This note is electronically signed Molli Hazard, MD  08/28/2016 12:09 PM

## 2016-08-28 NOTE — Patient Instructions (Addendum)
[place] at [facility] Discharge Instructions  RECOMMENDATIONS MADE BY THE CONSULTANT AND ANY TEST RESULTS WILL BE SENT TO YOUR REFERRING PHYSICIAN.  You saw Dr.Penland today. Lasix (Furosemide) was sent to pharmacy. Labs next week. Blood transfusion tomorrow. Follow up after visit with Dr. Denman George (around 11/16 or so) See Amy at checkout for appointments.  Thank you for choosing [place] at [facility] to provide your oncology and hematology care.  To afford each patient quality time with our provider, please arrive at least 15 minutes before your scheduled appointment time.   Beginning January 23rd 2017 lab work for the [facility] will be done in the  Main lab at [facility] on 1st floor. If you have a lab appointment with the [facility] please come in thru the  Main Entrance and check in at the main information desk  You need to re-schedule your appointment should you arrive 10 or more minutes late.  We strive to give you quality time with our providers, and arriving late affects you and other patients whose appointments are after yours.  Also, if you no show three or more times for appointments you may be dismissed from the clinic at the providers discretion.     Again, thank you for choosing [facility].  Our hope is that these requests will decrease the amount of time that you wait before being seen by our physicians.       _____________________________________________________________  Should you have questions after your visit to [facility], please contact our office at [phone] between the hours of 8:30 a.m. and 4:30 p.m.  Voicemails left after 4:30 p.m. will not be returned until the following business day.  For prescription refill requests, have your pharmacy contact our office.         Resources For Cancer Patients and their Caregivers ? American Cancer Society: Can assist with  transportation, wigs, general needs, runs Look Good Feel Better.        (210) 142-4403 ? Cancer Care: Provides financial assistance, online support groups, medication/co-pay assistance.  1-800-813-HOPE (203)664-4326) ? [place] Assists [place] cancer patients and their families through emotional , educational and financial support.  470 804 1826 ? [place] DSS Where to apply for food stamps, Medicaid and utility assistance. 925-300-2170 ? RCATS: Transportation to medical appointments. 629-047-2676 ? Social Security Administration: May apply for disability if have a Stage IV cancer. 262-631-3821 (810)555-1840 ? [employer], Disability and Transit Services: Assists with nutrition, care and transit needs. [facility] Support Programs: @10RELATIVEDAYS @ > Cancer Support Group  2nd Tuesday of the month 1pm-2pm, Journey Room  > Creative Journey  3rd Tuesday of the month 1130am-1pm, Journey Room  > Look Good Feel Better  1st Wednesday of the month 10am-12 noon, Journey Room (Call [place] to register [phone])

## 2016-08-28 NOTE — Patient Instructions (Signed)
[facility] Discharge Instructions for Patients Receiving Chemotherapy   Beginning January 23rd 2017 lab work for the Cancer Center will be done in the  Main lab at  on 1st floor. If you have a lab appointment with the Cancer Center please come in thru the  Main Entrance and check in at the main information desk   Today you received the following chemotherapy agents:  Taxol and carboplatin  To help prevent nausea and vomiting after your treatment, we encourage you to take your nausea medication as prescribed.  If you develop nausea and vomiting, or diarrhea that is not controlled by your medication, call the clinic.  The clinic phone number is [phone]. Office hours are Monday-Friday 8:30am-5:00pm.  BELOW ARE SYMPTOMS THAT SHOULD BE REPORTED IMMEDIATELY:  *FEVER GREATER THAN 101.0 F  *CHILLS WITH OR WITHOUT FEVER  NAUSEA AND VOMITING THAT IS NOT CONTROLLED WITH YOUR NAUSEA MEDICATION  *UNUSUAL SHORTNESS OF BREATH  *UNUSUAL BRUISING OR BLEEDING  TENDERNESS IN MOUTH AND THROAT WITH OR WITHOUT PRESENCE OF ULCERS  *URINARY PROBLEMS  *BOWEL PROBLEMS  UNUSUAL RASH Items with * indicate a potential emergency and should be followed up as soon as possible. If you have an emergency after office hours please contact your primary care physician or go to the nearest emergency department.  Please call the clinic during office hours if you have any questions or concerns.   You may also contact the Patient Navigator at [phone] should you have any questions or need assistance in obtaining follow up care.      Resources For Cancer Patients and their Caregivers ? American Cancer Society: Can assist with transportation, wigs, general needs, runs Look Good Feel Better.        1-888-227-6333 ? Cancer Care: Provides financial assistance, online support groups, medication/co-pay assistance.  1-800-813-HOPE (4673) ? [facility] Assists [place] cancer patients and their families through emotional , educational and financial support.  336-427-4357 ? [place] DSS Where to apply for food stamps, Medicaid and utility assistance. 336-342-1394 ? RCATS: Transportation to medical appointments. 336-347-2287 ? Social Security Administration: May apply for disability if have a Stage IV cancer. 336-342-7796 1-800-772-1213 ? [place] Aging, Disability and Transit Services: Assists with nutrition, care and transit needs. 336-349-2343         

## 2016-08-29 ENCOUNTER — Other Ambulatory Visit (HOSPITAL_COMMUNITY): Payer: Self-pay

## 2016-08-29 ENCOUNTER — Encounter (HOSPITAL_BASED_OUTPATIENT_CLINIC_OR_DEPARTMENT_OTHER): Payer: BC Managed Care – PPO

## 2016-08-29 ENCOUNTER — Encounter (HOSPITAL_COMMUNITY): Payer: Self-pay

## 2016-08-29 DIAGNOSIS — D6481 Anemia due to antineoplastic chemotherapy: Secondary | ICD-10-CM

## 2016-08-29 DIAGNOSIS — T451X5A Adverse effect of antineoplastic and immunosuppressive drugs, initial encounter: Principal | ICD-10-CM

## 2016-08-29 DIAGNOSIS — D649 Anemia, unspecified: Secondary | ICD-10-CM

## 2016-08-29 DIAGNOSIS — C569 Malignant neoplasm of unspecified ovary: Secondary | ICD-10-CM

## 2016-08-29 DIAGNOSIS — R935 Abnormal findings on diagnostic imaging of other abdominal regions, including retroperitoneum: Secondary | ICD-10-CM | POA: Diagnosis not present

## 2016-08-29 LAB — PREPARE RBC (CROSSMATCH)

## 2016-08-29 LAB — CA 125: CA 125: 1835 U/mL — AB (ref 0.0–38.1)

## 2016-08-29 MED ORDER — DIPHENHYDRAMINE HCL 25 MG PO CAPS
25.0000 mg | ORAL_CAPSULE | Freq: Once | ORAL | Status: AC
  Administered 2016-08-29: 25 mg via ORAL
  Filled 2016-08-29: qty 1

## 2016-08-29 MED ORDER — SODIUM CHLORIDE 0.9 % IV SOLN
250.0000 mL | Freq: Once | INTRAVENOUS | Status: AC
  Administered 2016-08-29: 250 mL via INTRAVENOUS

## 2016-08-29 MED ORDER — SODIUM CHLORIDE 0.9% FLUSH
10.0000 mL | INTRAVENOUS | Status: AC | PRN
  Administered 2016-08-29: 10 mL

## 2016-08-29 MED ORDER — SODIUM CHLORIDE 0.9% FLUSH: 3.0000 mL | INTRAVENOUS | Status: DC | PRN

## 2016-08-29 MED ORDER — HEPARIN SOD (PORK) LOCK FLUSH 100 UNIT/ML IV SOLN
500.0000 [IU] | Freq: Every day | INTRAVENOUS | Status: AC | PRN
Start: 2016-08-29 — End: 2016-08-29
  Administered 2016-08-29: 500 [IU]

## 2016-08-29 MED ORDER — HEPARIN SOD (PORK) LOCK FLUSH 100 UNIT/ML IV SOLN
INTRAVENOUS | Status: AC
  Filled 2016-08-29: qty 5

## 2016-08-29 MED ORDER — ACETAMINOPHEN 325 MG PO TABS: 650.0000 mg | ORAL_TABLET | Freq: Once | ORAL | Status: DC

## 2016-08-29 NOTE — Progress Notes (Signed)
Patient received 2 units of blood today per orders. Patient tolerated well, without problems. Vitals stable and discharged home from clinic ambulatory.

## 2016-08-29 NOTE — Patient Instructions (Signed)
[place] at [facility] Discharge Instructions  RECOMMENDATIONS MADE BY THE CONSULTANT AND ANY TEST RESULTS WILL BE SENT TO YOUR REFERRING PHYSICIAN.  2 units of blood given today. Follow up as scheduled  Thank you for choosing [place] at [facility] to provide your oncology and hematology care.  To afford each patient quality time with our provider, please arrive at least 15 minutes before your scheduled appointment time.   Beginning January 23rd 2017 lab work for the [facility] will be done in the  Main lab at [facility] on 1st floor. If you have a lab appointment with the [facility] please come in thru the  Main Entrance and check in at the main information desk  You need to re-schedule your appointment should you arrive 10 or more minutes late.  We strive to give you quality time with our providers, and arriving late affects you and other patients whose appointments are after yours.  Also, if you no show three or more times for appointments you may be dismissed from the clinic at the providers discretion.     Again, thank you for choosing [facility].  Our hope is that these requests will decrease the amount of time that you wait before being seen by our physicians.       _____________________________________________________________  Should you have questions after your visit to [facility], please contact our office at [phone] between the hours of 8:30 a.m. and 4:30 p.m.  Voicemails left after 4:30 p.m. will not be returned until the following business day.  For prescription refill requests, have your pharmacy contact our office.         Resources For Cancer Patients and their Caregivers ? American Cancer Society: Can assist with transportation, wigs, general needs, runs Look Good Feel Better.        (740)157-3273 ? Cancer Care: Provides financial assistance, online support groups,  medication/co-pay assistance.  1-800-813-HOPE 671-009-1933) ? [place] Assists [place] cancer patients and their families through emotional , educational and financial support.  985-364-0533 ? [place] DSS Where to apply for food stamps, Medicaid and utility assistance. (220) 391-7674 ? RCATS: Transportation to medical appointments. 947-105-0963 ? Social Security Administration: May apply for disability if have a Stage IV cancer. (306)205-7161 586-047-3173 ? [employer], Disability and Transit Services: Assists with nutrition, care and transit needs. [place] Support Programs: @10RELATIVEDAYS @ > Cancer Support Group  2nd Tuesday of the month 1pm-2pm, Journey Room  > Creative Journey  3rd Tuesday of the month 1130am-1pm, Journey Room  > Look Good Feel Better  1st Wednesday of the month 10am-12 noon, Journey Room (Call [place] to register [phone])

## 2016-08-30 LAB — TYPE AND SCREEN
ABO/RH(D): O POS
Antibody Screen: NEGATIVE
UNIT DIVISION: 0
Unit division: 0

## 2016-08-31 ENCOUNTER — Other Ambulatory Visit (HOSPITAL_COMMUNITY): Payer: Self-pay | Admitting: Hematology & Oncology

## 2016-08-31 DIAGNOSIS — E538 Deficiency of other specified B group vitamins: Secondary | ICD-10-CM | POA: Insufficient documentation

## 2016-08-31 DIAGNOSIS — D519 Vitamin B12 deficiency anemia, unspecified: Secondary | ICD-10-CM | POA: Insufficient documentation

## 2016-09-01 ENCOUNTER — Other Ambulatory Visit (HOSPITAL_COMMUNITY): Payer: Self-pay | Admitting: *Deleted

## 2016-09-01 DIAGNOSIS — E538 Deficiency of other specified B group vitamins: Secondary | ICD-10-CM

## 2016-09-01 DIAGNOSIS — C569 Malignant neoplasm of unspecified ovary: Secondary | ICD-10-CM

## 2016-09-03 ENCOUNTER — Other Ambulatory Visit (HOSPITAL_COMMUNITY): Payer: Self-pay | Admitting: *Deleted

## 2016-09-03 DIAGNOSIS — T451X5A Adverse effect of antineoplastic and immunosuppressive drugs, initial encounter: Principal | ICD-10-CM

## 2016-09-03 DIAGNOSIS — D6481 Anemia due to antineoplastic chemotherapy: Secondary | ICD-10-CM

## 2016-09-04 ENCOUNTER — Encounter (HOSPITAL_BASED_OUTPATIENT_CLINIC_OR_DEPARTMENT_OTHER): Payer: BC Managed Care – PPO

## 2016-09-04 ENCOUNTER — Encounter (HOSPITAL_COMMUNITY): Payer: BC Managed Care – PPO | Attending: Hematology & Oncology

## 2016-09-04 ENCOUNTER — Encounter (HOSPITAL_COMMUNITY): Payer: Self-pay

## 2016-09-04 VITALS — BP 146/65 | HR 94 | Temp 98.1°F | Resp 18

## 2016-09-04 DIAGNOSIS — T451X5A Adverse effect of antineoplastic and immunosuppressive drugs, initial encounter: Secondary | ICD-10-CM | POA: Insufficient documentation

## 2016-09-04 DIAGNOSIS — E538 Deficiency of other specified B group vitamins: Secondary | ICD-10-CM

## 2016-09-04 DIAGNOSIS — C569 Malignant neoplasm of unspecified ovary: Secondary | ICD-10-CM | POA: Insufficient documentation

## 2016-09-04 DIAGNOSIS — D6481 Anemia due to antineoplastic chemotherapy: Secondary | ICD-10-CM | POA: Diagnosis not present

## 2016-09-04 LAB — CBC WITH DIFFERENTIAL/PLATELET
BASOS PCT: 0 %
Basophils Absolute: 0 10*3/uL (ref 0.0–0.1)
EOS PCT: 6 %
Eosinophils Absolute: 0.1 10*3/uL (ref 0.0–0.7)
HEMATOCRIT: 31 % — AB (ref 36.0–46.0)
HEMOGLOBIN: 10.6 g/dL — AB (ref 12.0–15.0)
LYMPHS PCT: 18 %
Lymphs Abs: 0.2 10*3/uL — ABNORMAL LOW (ref 0.7–4.0)
MCH: 33 pg (ref 26.0–34.0)
MCHC: 34.2 g/dL (ref 30.0–36.0)
MCV: 96.6 fL (ref 78.0–100.0)
MONO ABS: 0.1 10*3/uL (ref 0.1–1.0)
MONOS PCT: 6 %
NEUTROS PCT: 70 %
Neutro Abs: 0.7 10*3/uL — ABNORMAL LOW (ref 1.7–7.7)
PLATELETS: 125 10*3/uL — AB (ref 150–400)
RBC: 3.21 MIL/uL — AB (ref 3.87–5.11)
RDW: 16.9 % — ABNORMAL HIGH (ref 11.5–15.5)
WBC: 1.1 10*3/uL — AB (ref 4.0–10.5)

## 2016-09-04 LAB — COMPREHENSIVE METABOLIC PANEL
ALBUMIN: 3.6 g/dL (ref 3.5–5.0)
ALT: 19 U/L (ref 14–54)
AST: 21 U/L (ref 15–41)
Alkaline Phosphatase: 75 U/L (ref 38–126)
Anion gap: 7 (ref 5–15)
BILIRUBIN TOTAL: 0.3 mg/dL (ref 0.3–1.2)
BUN: 30 mg/dL — AB (ref 6–20)
CALCIUM: 9.4 mg/dL (ref 8.9–10.3)
CO2: 23 mmol/L (ref 22–32)
CREATININE: 0.74 mg/dL (ref 0.44–1.00)
Chloride: 107 mmol/L (ref 101–111)
GFR calc Af Amer: >60 mL/min (ref >60)
GFR calc non Af Amer: >60 mL/min (ref >60)
GLUCOSE: 170 mg/dL — AB (ref 65–99)
Potassium: 4.2 mmol/L (ref 3.5–5.1)
Sodium: 137 mmol/L (ref 135–145)
TOTAL PROTEIN: 6.4 g/dL — AB (ref 6.5–8.1)

## 2016-09-04 LAB — SAMPLE TO BLOOD BANK

## 2016-09-04 MED ORDER — CYANOCOBALAMIN 1000 MCG/ML IJ SOLN
INTRAMUSCULAR | Status: AC
  Filled 2016-09-04: qty 1

## 2016-09-04 MED ORDER — CYANOCOBALAMIN 1000 MCG/ML IJ SOLN
1000.0000 ug | INTRAMUSCULAR | Status: DC
  Administered 2016-09-04: 1000 ug via INTRAMUSCULAR

## 2016-09-04 NOTE — Patient Instructions (Signed)
[place] at [facility] Discharge Instructions  RECOMMENDATIONS MADE BY THE CONSULTANT AND ANY TEST RESULTS WILL BE SENT TO YOUR REFERRING PHYSICIAN.  B12 injection today. Return as scheduled for injections. Return as scheduled for office visit.   Thank you for choosing [place] at [facility] to provide your oncology and hematology care.  To afford each patient quality time with our provider, please arrive at least 15 minutes before your scheduled appointment time.   Beginning January 23rd 2017 lab work for the [facility] will be done in the  Main lab at [facility] on 1st floor. If you have a lab appointment with the [facility] please come in thru the  Main Entrance and check in at the main information desk  You need to re-schedule your appointment should you arrive 10 or more minutes late.  We strive to give you quality time with our providers, and arriving late affects you and other patients whose appointments are after yours.  Also, if you no show three or more times for appointments you may be dismissed from the clinic at the providers discretion.     Again, thank you for choosing [facility].  Our hope is that these requests will decrease the amount of time that you wait before being seen by our physicians.       _____________________________________________________________  Should you have questions after your visit to [facility], please contact our office at [phone] between the hours of 8:30 a.m. and 4:30 p.m.  Voicemails left after 4:30 p.m. will not be returned until the following business day.  For prescription refill requests, have your pharmacy contact our office.         Resources For Cancer Patients and their Caregivers ? American Cancer Society: Can assist with transportation, wigs, general needs, runs Look Good Feel Better.        610 692 8737 ? Cancer Care: Provides financial  assistance, online support groups, medication/co-pay assistance.  1-800-813-HOPE 626 536 9612) ? [place] Assists [place] cancer patients and their families through emotional , educational and financial support.  (548) 277-1306 ? [place] DSS Where to apply for food stamps, Medicaid and utility assistance. 807 089 8269 ? RCATS: Transportation to medical appointments. 432-724-2146 ? Social Security Administration: May apply for disability if have a Stage IV cancer. 4021039184 228-542-3984 ? [employer], Disability and Transit Services: Assists with nutrition, care and transit needs. [place] Support Programs: @10RELATIVEDAYS @ > Cancer Support Group  2nd Tuesday of the month 1pm-2pm, Journey Room  > Creative Journey  3rd Tuesday of the month 1130am-1pm, Journey Room  > Look Good Feel Better  1st Wednesday of the month 10am-12 noon, Journey Room (Call [employer] to register [phone])

## 2016-09-04 NOTE — Progress Notes (Signed)
CRITICAL VALUE ALERT Critical value received:  WBC-1.1 Date of notification:  09/04/16 Time of notification: [id number] Critical value read back:  Yes.   Nurse who received alert:  M.Ramar Nobrega, LPN MD notified (1st page):  S.[place]

## 2016-09-04 NOTE — Progress Notes (Signed)
Ellen Hunt presents today for injection per the provider's orders.  B12 administration without incident; see MAR for injection details.  Patient tolerated procedure well and without incident.  No questions or complaints noted at this time.

## 2016-09-05 LAB — INTRINSIC FACTOR ANTIBODIES: Intrinsic Factor: 1 AU/mL (ref 0.0–1.1)

## 2016-09-05 LAB — ANTI-PARIETAL ANTIBODY: PARIETAL CELL ANTIBODY-IGG: 6.4 U (ref 0.0–20.0)

## 2016-09-07 ENCOUNTER — Other Ambulatory Visit (HOSPITAL_COMMUNITY): Payer: Self-pay | Admitting: Hematology & Oncology

## 2016-09-11 ENCOUNTER — Ambulatory Visit (HOSPITAL_COMMUNITY)
Admission: RE | Admit: 2016-09-11 | Discharge: 2016-09-11 | Disposition: A | Discharge status: Home / Self Care | Payer: BC Managed Care – PPO | Source: Ambulatory Visit | Attending: Hematology & Oncology | Admitting: Hematology & Oncology

## 2016-09-11 ENCOUNTER — Encounter (HOSPITAL_BASED_OUTPATIENT_CLINIC_OR_DEPARTMENT_OTHER): Payer: BC Managed Care – PPO

## 2016-09-11 VITALS — BP 143/61 | HR 98 | Temp 98.0°F | Resp 18

## 2016-09-11 DIAGNOSIS — R1900 Intra-abdominal and pelvic swelling, mass and lump, unspecified site: Secondary | ICD-10-CM | POA: Insufficient documentation

## 2016-09-11 DIAGNOSIS — Z08 Encounter for follow-up examination after completed treatment for malignant neoplasm: Secondary | ICD-10-CM | POA: Diagnosis not present

## 2016-09-11 DIAGNOSIS — E538 Deficiency of other specified B group vitamins: Secondary | ICD-10-CM

## 2016-09-11 DIAGNOSIS — G620 Drug-induced polyneuropathy: Secondary | ICD-10-CM

## 2016-09-11 DIAGNOSIS — D259 Leiomyoma of uterus, unspecified: Secondary | ICD-10-CM | POA: Diagnosis not present

## 2016-09-11 DIAGNOSIS — H409 Unspecified glaucoma: Secondary | ICD-10-CM

## 2016-09-11 DIAGNOSIS — R188 Other ascites: Secondary | ICD-10-CM | POA: Insufficient documentation

## 2016-09-11 DIAGNOSIS — I700 Atherosclerosis of aorta: Secondary | ICD-10-CM | POA: Insufficient documentation

## 2016-09-11 DIAGNOSIS — C569 Malignant neoplasm of unspecified ovary: Secondary | ICD-10-CM

## 2016-09-11 DIAGNOSIS — Z8543 Personal history of malignant neoplasm of ovary: Secondary | ICD-10-CM | POA: Insufficient documentation

## 2016-09-11 DIAGNOSIS — T451X5A Adverse effect of antineoplastic and immunosuppressive drugs, initial encounter: Secondary | ICD-10-CM

## 2016-09-11 DIAGNOSIS — I2510 Atherosclerotic heart disease of native coronary artery without angina pectoris: Secondary | ICD-10-CM | POA: Diagnosis not present

## 2016-09-11 MED ORDER — CYANOCOBALAMIN 1000 MCG/ML IJ SOLN
INTRAMUSCULAR | Status: AC
  Filled 2016-09-11: qty 1

## 2016-09-11 MED ORDER — IOPAMIDOL (ISOVUE-300) INJECTION 61%
100.0000 mL | Freq: Once | INTRAVENOUS | Status: AC | PRN
  Administered 2016-09-11: 100 mL via INTRAVENOUS

## 2016-09-11 MED ORDER — CYANOCOBALAMIN 1000 MCG/ML IJ SOLN
1000.0000 ug | INTRAMUSCULAR | Status: DC
  Administered 2016-09-11: 1000 ug via INTRAMUSCULAR

## 2016-09-11 NOTE — Patient Instructions (Signed)
[facility] at [facility] Discharge Instructions  RECOMMENDATIONS MADE BY THE CONSULTANT AND ANY TEST RESULTS WILL BE SENT TO YOUR REFERRING PHYSICIAN.  Received Vit B12 injection today. Follow-up as scheduled. Call clinic for any questions or concerns  Thank you for choosing [facility] at [facility] to provide your oncology and hematology care.  To afford each patient quality time with our provider, please arrive at least 15 minutes before your scheduled appointment time.   Beginning January 23rd 2017 lab work for the Cancer Center will be done in the  Main lab at [facility] on 1st floor. If you have a lab appointment with the Cancer Center please come in thru the  Main Entrance and check in at the main information desk  You need to re-schedule your appointment should you arrive 10 or more minutes late.  We strive to give you quality time with our providers, and arriving late affects you and other patients whose appointments are after yours.  Also, if you no show three or more times for appointments you may be dismissed from the clinic at the providers discretion.     Again, thank you for choosing [facility].  Our hope is that these requests will decrease the amount of time that you wait before being seen by our physicians.       _____________________________________________________________  Should you have questions after your visit to [facility], please contact our office at [phone] between the hours of 8:30 a.m. and 4:30 p.m.  Voicemails left after 4:30 p.m. will not be returned until the following business day.  For prescription refill requests, have your pharmacy contact our office.         Resources For Cancer Patients and their Caregivers ? American Cancer Society: Can assist with transportation, wigs, general needs, runs Look Good Feel Better.        1-888-227-6333 ? Cancer Care: Provides  financial assistance, online support groups, medication/co-pay assistance.  1-800-813-HOPE (4673) ? [facility] Assists [place] cancer patients and their families through emotional , educational and financial support.  336-427-4357 ? [place] DSS Where to apply for food stamps, Medicaid and utility assistance. 336-342-1394 ? RCATS: Transportation to medical appointments. 336-347-2287 ? Social Security Administration: May apply for disability if have a Stage IV cancer. 336-342-7796 1-800-772-1213 ? [place] Aging, Disability and Transit Services: Assists with nutrition, care and transit needs. 336-349-2343  Cancer Center Support Programs: @10RELATIVEDAYS@ > Cancer Support Group  2nd Tuesday of the month 1pm-2pm, Journey Room  > Creative Journey  3rd Tuesday of the month 1130am-1pm, Journey Room  > Look Good Feel Better  1st Wednesday of the month 10am-12 noon, Journey Room (Call American [employer] to register 1-800-395-5775)   

## 2016-09-11 NOTE — Progress Notes (Signed)
Ellen Hunt tolerated Vit B12 injection well without complaints or incident. VSS.Pt discharged self ambulatory in satisfactory condition with husband

## 2016-09-14 ENCOUNTER — Other Ambulatory Visit (HOSPITAL_COMMUNITY): Payer: Self-pay | Admitting: Hematology & Oncology

## 2016-09-16 ENCOUNTER — Ambulatory Visit (HOSPITAL_COMMUNITY): Payer: BC Managed Care – PPO | Admitting: Oncology

## 2016-09-17 ENCOUNTER — Other Ambulatory Visit (HOSPITAL_COMMUNITY): Payer: Self-pay | Admitting: Hematology & Oncology

## 2016-09-17 ENCOUNTER — Telehealth: Payer: Self-pay | Admitting: Family Medicine

## 2016-09-17 ENCOUNTER — Encounter: Payer: Self-pay | Admitting: Gynecologic Oncology

## 2016-09-17 ENCOUNTER — Ambulatory Visit: Payer: BC Managed Care – PPO | Attending: Gynecologic Oncology | Admitting: Gynecologic Oncology

## 2016-09-17 ENCOUNTER — Other Ambulatory Visit (HOSPITAL_COMMUNITY): Payer: Self-pay | Admitting: Emergency Medicine

## 2016-09-17 ENCOUNTER — Telehealth: Payer: Self-pay | Admitting: Gynecologic Oncology

## 2016-09-17 VITALS — BP 127/51 | HR 94 | Temp 97.8°F | Resp 18 | Ht 64.0 in | Wt 215.2 lb

## 2016-09-17 DIAGNOSIS — Z7982 Long term (current) use of aspirin: Secondary | ICD-10-CM | POA: Diagnosis not present

## 2016-09-17 DIAGNOSIS — C5700 Malignant neoplasm of unspecified fallopian tube: Secondary | ICD-10-CM | POA: Insufficient documentation

## 2016-09-17 DIAGNOSIS — E785 Hyperlipidemia, unspecified: Secondary | ICD-10-CM | POA: Insufficient documentation

## 2016-09-17 DIAGNOSIS — I10 Essential (primary) hypertension: Secondary | ICD-10-CM | POA: Insufficient documentation

## 2016-09-17 DIAGNOSIS — Z803 Family history of malignant neoplasm of breast: Secondary | ICD-10-CM | POA: Diagnosis not present

## 2016-09-17 DIAGNOSIS — Z794 Long term (current) use of insulin: Secondary | ICD-10-CM | POA: Diagnosis not present

## 2016-09-17 DIAGNOSIS — R188 Other ascites: Secondary | ICD-10-CM | POA: Diagnosis not present

## 2016-09-17 DIAGNOSIS — E119 Type 2 diabetes mellitus without complications: Secondary | ICD-10-CM | POA: Diagnosis not present

## 2016-09-17 DIAGNOSIS — C569 Malignant neoplasm of unspecified ovary: Secondary | ICD-10-CM | POA: Diagnosis not present

## 2016-09-17 MED ORDER — VALACYCLOVIR HCL 500 MG PO TABS: 500.0000 mg | ORAL_TABLET | Freq: Two times a day (BID) | ORAL | 0 refills | Status: DC

## 2016-09-17 MED ORDER — DEXAMETHASONE 4 MG PO TABS: ORAL_TABLET | ORAL | 1 refills | Status: DC

## 2016-09-17 NOTE — Telephone Encounter (Signed)
Left message asking Ellen Hunt with [facility] [place] to please call the office to discuss instructions for insulin pump use around surgery.

## 2016-09-17 NOTE — Telephone Encounter (Signed)
I spoke with Ellen Hunt and she detailed the following: Patient is schedueld to have ovarian CA debulking surgery December.  They need recommendation for use of insulin pump prior to, during and after surgery.  Surgery will likely be early am and she will remain in hospital for 2 to 3 days post surgery. I will discuss with her provider and get back to [place] [facility] tomorrow

## 2016-09-17 NOTE — Progress Notes (Signed)
Consult Note: Gyn-Onc  Consult was requested by Dr. Whitney Muse for the evaluation of Ellen Hunt 65 y.o. female  CC:  Chief Complaint  Patient presents with  . Ovarian cancer, unspecified laterality [facility] )    Per request of Dr Whitney Muse    Assessment/Plan:  Ellen Hunt  is a 65 y.o.  year old with stage IIIC ovarian/primary peritoneal/fallopian tube cancer with slow but progressive response to neoadjuvant chemotherapy with carboplatin and paclitaxel.  Her disease burden appears amenable to a surgical debulking effort, though it will likely involved a combined MIS and open approach in order to minimize incision size in this patient who is at a high risk for wound healing failure or infection.  I recommend 1 additional dose of chemotherapy with planned surgery 3 weeks later.  1/ recheck hemoglobin 1 week preop to evaluate for need for transfusion 2/ coordinate management of insulin pump perioperatively with Dr Tawanna Sat office (she will need to take the pump off of the abdomen for surgery). 3/ preoperative bowel prep, cefoxatin antibiotics and lovenox 4/ schedule for robotic assisted total hysterectomy, BSO, and laparotomy for total omentectomy and tumor debulking surgery. 5/ Lovenox for 4 weeks postop prophylaxis.  We again discussed the high risk of complications particularly wound healing issues, infection, damage to internal organs, reoperation, death, VTE.  I discussed that her obesity, diabetes and recent chemotherapy place her at increased risk for this. She has experience with wound healing issues requiring wound vac (her cesarean section).  I discussed that the role of surgery was not curative as a single strategy. I discussed that surgery cannot remove all disease, but instead works as an adjunct to chemotherapy to debulk the burden of disease and potentially reduce the number of necessary chemotherapy cycles.  We will coordinate with Dr Donald Pore office to see her back after  cycle #7 for surgery.  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  She has a history of feeling bloated and extended since June 1st, 2017. She was seen in the ED on 04/30/16 and a CT scan of the abdomen and pelvis was performed it revealed moderate volume ascites, masslike expansion of the uterine fundus, no adnexal masses seen. There was extensive omental caking compatible with peritoneal metastatic disease. No pathologic adenopathy was identified. 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 The patient was seen and evaluated by Dr. Whitney Muse with a plan in place for chemotherapy when appropriate.   She is morbidly obese with a weight of 247lbs. She has poorly controlled DM (last HbA1C was >9%) though she has more recently been on tighter control of her glucose with metformin and insulin.  She was diagnosed with shingles above her left eye immediately prior to starting chemotherapy. Between 05/15/16 (day 1 cycle 1) and 06/26/16 (day 1 cycle 3) she has received 3 doses of neoadjuvant carboplatin and paclitaxel. Her CA 125 was 7149 on 05/01/16 and increased to 11,113 on 05/16/16 on day of chemotherapy initiation. It was increased to 12,359 prior to cycle 2 and decreased to 8,811 for day 1 of cycle 3.    Interval History:  Repeat CT scan after 3 cycles showed minimal improvement. Given her modest CA 125 and CT response to 3 cycles of chemotherapy, she was felt to not be a good candidate for  interval debulking at that time.  She received an additional 3 cycles of carboplatin and paclitaxel (last dose on 08/28/16). She has been tolerating chemotherapy well though does have issues with anemia and has required 2 blood transfusions. CA 125 on day 1 of cycle 6 (08/28/16)  was 1,835 (which is a steady reduction over the past 3 cycles).  CT imaging on 09/11/16 showed No significant change omental soft tissue caking, consistent with metastatic disease. Mild ascites is decreased since previous study. Increased calcification along peritoneal surface in pelvic cul-de-sac, consistent with treated peritoneal metastatic disease. Stable 4.5cm homogeneous right pelvic mass, which favors a uterine fibroid although right ovarian neoplasm cannot definitely be excluded.  No new or progressive metastatic disease identified. No evidence of metastatic disease within the thorax..    Current Meds:  Outpatient Encounter Prescriptions as of 09/17/2016  Medication Sig  . aspirin 81 MG tablet Take 1 tablet (81 mg total) by mouth daily. (Patient taking differently: Take 81 mg by mouth every evening. )  . atorvastatin (LIPITOR) 40 MG tablet TAKE 1 TABLET BY MOUTH EVERY DAY  . benazepril (LOTENSIN) 10 MG tablet TAKE 1 TABLET BY MOUTH EVERY DAY  . CARBOPLATIN IV Inject into the vein. Every 21 days  . cephALEXin (KEFLEX) 500 MG capsule Take 2 capsules (1,000 mg total) by mouth 2 (two) times daily.  . diazepam (VALIUM) 5 MG tablet Take 0.5 tablets (2.5 mg total) by mouth every 4 (four) hours as needed (spasms).  . furosemide (LASIX) 20 MG tablet Take 1 tablet (20 mg total) by mouth daily.  Marland Kitchen glucagon (GLUCAGON EMERGENCY) 1 MG injection Inject as directed as needed for low blood glucose if unresponsive.  [place] glucosamine-chondroitin 500-400 MG tablet Take 1 tablet by mouth 2 (two) times daily.  Marland Kitchen glucose blood (ONE TOUCH ULTRA TEST) test strip USE TO CHECK BLOOD SUGAR UP TO 5 TIMES A DAY  . HYDROcodone-acetaminophen (NORCO) 10-325 MG tablet Take 1 tablet by mouth every 4 (four) hours as needed.  . lidocaine-prilocaine (EMLA) cream Apply a quarter size amount to port site 1 hour prior to chemo. Do not rub in. Cover with plastic wrap.  . metFORMIN (GLUCOPHAGE) 1000 MG tablet TAKE 1 TABLET BY  MOUTH TWICE DAILY WITH A MEAL  . Multiple Vitamin (MULTIVITAMIN WITH MINERALS) TABS Take 1 tablet by mouth daily.  Marland Kitchen NOVOLOG 100 UNIT/ML injection USE IN INSULIN PUMP AS DIRECTED (MAX DAILY DOSE IS 110 UNITS PER DAY)  . omeprazole (PRILOSEC) 40 MG capsule TAKE 1 CAPSULE(40 MG) BY MOUTH DAILY  . ondansetron (ZOFRAN) 8 MG tablet Take 1 tablet (8 mg total) by mouth every 8 (eight) hours as needed for nausea or vomiting.  Marland Kitchen PACLitaxel (TAXOL IV) Inject into the vein. Every 21 days  . [DISCONTINUED] dexamethasone (DECADRON) 4 MG tablet The day before chemo take 5 tabs (20mg  total) in the am and 5 tabs (20mg  total) in the pm. The morning of chemo take 5 tabs (20mg  total).  . [DISCONTINUED] valACYclovir (VALTREX) 500 MG tablet Take 1 tablet (500 mg total) by mouth 2 (two) times daily. Start after completion of 1000 mg po tid  . [DISCONTINUED] prochlorperazine (COMPAZINE) 10 MG tablet Take 1 tablet (10 mg total) by mouth every 6 (six) hours as needed for nausea or vomiting.   No facility-administered encounter medications on file as of 09/17/2016.     Allergy:  Allergies  Allergen Reactions  . Avandia [Rosiglitazone] Other (See Comments)    Legs swelled  . Micronase [Glyburide]  Swelling  . Actos [Pioglitazone] Other (See Comments)    Edema / leg swelling    Social Hx:   Social History   Social History  . Marital status: Married    Spouse name: Gershon Mussel  . Number of children: 2  . Years of education: N/A   Occupational History  . Not on file.   Social History Main Topics  . Smoking status: Never Smoker  . Smokeless tobacco: Never Used  . Alcohol use No  . Drug use: No  . Sexual activity: Not on file   Other Topics Concern  . Not on file   Social History Narrative  . No narrative on file    Past Surgical Hx:  Past Surgical History:  Procedure Laterality Date  . CESAREAN SECTION    . LUMBAR FUSION  08/21/15   L3-L4 Dr. Timmothy Euler    Past Medical Hx:  Past Medical History:   Diagnosis Date  . Abnormal CT of the abdomen 04/30/2016  . Diabetes mellitus without complication ([place])   . Family history of breast cancer   . Hyperlipidemia   . Hypertension   . Low serum vitamin D   . Ovarian cancer ([place]) 05/09/2016    Past Gynecological History:  No LMP recorded. Patient is postmenopausal.  Family Hx:  Family History  Problem Relation Age of Onset  . Lung cancer Mother     smoker; dx in her 43s  . Leukemia Father   . Diabetes Paternal Grandmother   . Heart attack Paternal Grandmother   . Diabetes Paternal Grandfather   . Breast cancer Paternal Aunt     dx in her 68s-30s  . Leukemia Paternal Uncle   . Heart attack Maternal Grandfather   . Breast cancer Cousin     maternal first cousin    Review of Systems:  Constitutional  Feels fatigued  ENT Normal appearing ears and nares bilaterally Skin/Breast  resolved right temporal and eyebrow vesicular rash Cardiovascular  No chest pain, shortness of breath, or edema  Pulmonary  No cough or wheeze.  Gastro Intestinal  improved bloating, nausea, early satiety Genito Urinary  No frequency, urgency, dysuria, no bleeding Musculo Skeletal  No myalgia, arthralgia, joint swelling or pain  Neurologic  No weakness, numbness, change in gait,  Psychology  No depression, anxiety, insomnia.   Vitals:  Blood pressure (!) 127/51, pulse 94, temperature 97.8 F (36.6 C), temperature source Oral, resp. rate 18, height 5\' 4"  (1.626 m), weight 215 lb 3.2 oz (97.6 kg), SpO2 98 %.  Physical Exam: WD in NAD Neck  Supple NROM, without any enlargements.  Lymph Node Survey No cervical supraclavicular or inguinal adenopathy Cardiovascular  Pulse normal rate, regularity and rhythm. S1 and S2 normal.  Lungs  Clear to auscultation bilateraly, without wheezes/crackles/rhonchi. Good air movement.  Skin  No rash/lesions/breakdown  Psychiatry  Alert and oriented to person, place, and time  Abdomen  Normoactive bowel  sounds, abdomen soft, non-tender and obese without evidence of hernia. Vertical midline incision from prior cesarean. Back No CVA tenderness Genito Urinary  Vulva/vagina: Normal external female genitalia.  No lesions. No discharge or bleeding.  Bladder/urethra:  No lesions or masses, well supported bladder  Vagina: normal  Cervix: Normal appearing, no lesions.  Uterus: Small, mobile, no parametrial involvement or nodularity.  Adnexa: no palpable masses. Rectal  Good tone, no masses no cul de sac nodularity.  Extremities  No bilateral cyanosis, clubbing or edema.   Donaciano Eva, MD  09/17/2016, 5:40  PM

## 2016-09-17 NOTE — Patient Instructions (Signed)
Preparing for your Surgery  Plan for surgery on October 14, 2016 with Dr. Everitt Amber at [facility] will be scheduled for a robotic assisted total hysterectomy, bilateral salpingo-oophorectomy, open omentectomy, open debulking.    Pre-operative Testing -You will receive a phone call from presurgical testing at [facility] to arrange for a pre-operative testing appointment before your surgery.  This appointment normally occurs one to two weeks before your scheduled surgery.   -Bring your insurance card, copy of an advanced directive if applicable, medication list  -At that visit, you will be asked to sign a consent for a possible blood transfusion in case a transfusion becomes necessary during surgery.  The need for a blood transfusion is rare but having consent is a necessary part of your care.     -You should not be taking blood thinners or aspirin at least ten days prior to surgery unless instructed by your surgeon.  Plan to drink two bottles of magnesium citrate the day before surgery.  Start around 9 am or 10 am.    Day Before Surgery at [place] will be asked to take in a light diet the day before surgery.  Avoid carbonated beverages.  You will be advised to have nothing to eat or drink after midnight the evening before.     Eat a light diet the day before surgery.  Examples including soups, broths, toast, yogurt, mashed potatoes.  Things to avoid include carbonated beverages (fizzy beverages), raw fruits and raw vegetables, or beans.    If your bowels are filled with gas, your surgeon will have difficulty visualizing your pelvic organs which increases your surgical risks.  Your role in recovery Your role is to become active as soon as directed by your doctor, while still giving yourself time to heal.  Rest when you feel tired. You will be asked to do the following in order to speed your recovery:  - Cough and breathe deeply. This helps toclear  and expand your lungs and can prevent pneumonia. You may be given a spirometer to practice deep breathing. A staff member will show you how to use the spirometer. - Do mild physical activity. Walking or moving your legs help your circulation and body functions return to normal. A staff member will help you when you try to walk and will provide you with simple exercises. Do not try to get up or walk alone the first time. - Actively manage your pain. Managing your pain lets you move in comfort. We will ask you to rate your pain on a scale of zero to 10. It is your responsibility to tell your doctor or nurse where and how much you hurt so your pain can be treated.  Special Considerations -If you are diabetic, you may be placed on insulin after surgery to have closer control over your blood sugars to promote healing and recovery.  This does not mean that you will be discharged on insulin.  If applicable, your oral antidiabetics will be resumed when you are tolerating a solid diet.  -Your final pathology results from surgery should be available by the Friday after surgery and the results will be relayed to you when available.   Blood Transfusion Information WHAT IS A BLOOD TRANSFUSION? A transfusion is the replacement of blood or some of its parts. Blood is made up of multiple cells which provide different functions.  Red blood cells carry oxygen and are used for blood loss replacement.  White blood  cells fight against infection.  Platelets control bleeding.  Plasma helps clot blood.  Other blood products are available for specialized needs, such as hemophilia or other clotting disorders. BEFORE THE TRANSFUSION  Who gives blood for transfusions?   You may be able to donate blood to be used at a later date on yourself (autologous donation).  Relatives can be asked to donate blood. This is generally not any safer than if you have received blood from a stranger. The same precautions are taken to  ensure safety when a relative's blood is donated.  Healthy volunteers who are fully evaluated to make sure their blood is safe. This is blood bank blood. Transfusion therapy is the safest it has ever been in the practice of medicine. Before blood is taken from a donor, a complete history is taken to make sure that person has no history of diseases nor engages in risky social behavior (examples are intravenous drug use or sexual activity with multiple partners). The donor's travel history is screened to minimize risk of transmitting infections, such as malaria. The donated blood is tested for signs of infectious diseases, such as HIV and hepatitis. The blood is then tested to be sure it is compatible with you in order to minimize the chance of a transfusion reaction. If you or a relative donates blood, this is often done in anticipation of surgery and is not appropriate for emergency situations. It takes many days to process the donated blood. RISKS AND COMPLICATIONS Although transfusion therapy is very safe and saves many lives, the main dangers of transfusion include:   Getting an infectious disease.  Developing a transfusion reaction. This is an allergic reaction to something in the blood you were given. Every precaution is taken to prevent this. The decision to have a blood transfusion has been considered carefully by your caregiver before blood is given. Blood is not given unless the benefits outweigh the risks.

## 2016-09-17 NOTE — Progress Notes (Signed)
Called and spoke with Ellen Hunt to let her know that I made her chemo appt for 9:15 am on 09/18/2016.  I also called her in some more steroids.  Melissa NP from Dr Denman George office called and stated that they wanted Ellen Hunt to have one more chemotherapy prior to her have her surgery in December.  I spoke with Dr Whitney Muse about this matter and I made the appt.  Pt verbalized understanding.

## 2016-09-18 ENCOUNTER — Encounter (HOSPITAL_COMMUNITY): Payer: BC Managed Care – PPO

## 2016-09-18 ENCOUNTER — Encounter (HOSPITAL_BASED_OUTPATIENT_CLINIC_OR_DEPARTMENT_OTHER): Payer: BC Managed Care – PPO | Admitting: Hematology & Oncology

## 2016-09-18 ENCOUNTER — Encounter (HOSPITAL_BASED_OUTPATIENT_CLINIC_OR_DEPARTMENT_OTHER): Payer: BC Managed Care – PPO

## 2016-09-18 VITALS — BP 131/78 | HR 97 | Temp 97.3°F | Resp 18 | Wt 215.4 lb

## 2016-09-18 DIAGNOSIS — C569 Malignant neoplasm of unspecified ovary: Secondary | ICD-10-CM

## 2016-09-18 DIAGNOSIS — T39395A Adverse effect of other nonsteroidal anti-inflammatory drugs [NSAID], initial encounter: Secondary | ICD-10-CM

## 2016-09-18 DIAGNOSIS — N179 Acute kidney failure, unspecified: Secondary | ICD-10-CM

## 2016-09-18 DIAGNOSIS — D7589 Other specified diseases of blood and blood-forming organs: Secondary | ICD-10-CM | POA: Diagnosis not present

## 2016-09-18 DIAGNOSIS — E538 Deficiency of other specified B group vitamins: Secondary | ICD-10-CM | POA: Diagnosis not present

## 2016-09-18 DIAGNOSIS — E875 Hyperkalemia: Secondary | ICD-10-CM

## 2016-09-18 DIAGNOSIS — Z5111 Encounter for antineoplastic chemotherapy: Secondary | ICD-10-CM

## 2016-09-18 DIAGNOSIS — D6481 Anemia due to antineoplastic chemotherapy: Secondary | ICD-10-CM | POA: Diagnosis not present

## 2016-09-18 DIAGNOSIS — T451X5A Adverse effect of antineoplastic and immunosuppressive drugs, initial encounter: Secondary | ICD-10-CM

## 2016-09-18 DIAGNOSIS — R7989 Other specified abnormal findings of blood chemistry: Secondary | ICD-10-CM

## 2016-09-18 DIAGNOSIS — D696 Thrombocytopenia, unspecified: Secondary | ICD-10-CM | POA: Diagnosis not present

## 2016-09-18 LAB — CBC WITH DIFFERENTIAL/PLATELET
BASOS PCT: 0 %
Basophils Absolute: 0 10*3/uL (ref 0.0–0.1)
Eosinophils Absolute: 0 10*3/uL (ref 0.0–0.7)
Eosinophils Relative: 0 %
HEMATOCRIT: 27.9 % — AB (ref 36.0–46.0)
Hemoglobin: 9.4 g/dL — ABNORMAL LOW (ref 12.0–15.0)
LYMPHS ABS: 0.4 10*3/uL — AB (ref 0.7–4.0)
Lymphocytes Relative: 6 %
MCH: 33.5 pg (ref 26.0–34.0)
MCHC: 33.7 g/dL (ref 30.0–36.0)
MCV: 99.3 fL (ref 78.0–100.0)
MONO ABS: 0.1 10*3/uL (ref 0.1–1.0)
MONOS PCT: 2 %
NEUTROS ABS: 6 10*3/uL (ref 1.7–7.7)
Neutrophils Relative %: 92 %
Platelets: 108 10*3/uL — ABNORMAL LOW (ref 150–400)
RBC: 2.81 MIL/uL — ABNORMAL LOW (ref 3.87–5.11)
RDW: 17 % — AB (ref 11.5–15.5)
WBC: 6.5 10*3/uL (ref 4.0–10.5)

## 2016-09-18 LAB — COMPREHENSIVE METABOLIC PANEL
ALBUMIN: 3.8 g/dL (ref 3.5–5.0)
ALT: 19 U/L (ref 14–54)
ANION GAP: 8 (ref 5–15)
AST: 24 U/L (ref 15–41)
Alkaline Phosphatase: 77 U/L (ref 38–126)
BILIRUBIN TOTAL: 0.5 mg/dL (ref 0.3–1.2)
BUN: 43 mg/dL — ABNORMAL HIGH (ref 6–20)
CALCIUM: 9.7 mg/dL (ref 8.9–10.3)
CO2: 22 mmol/L (ref 22–32)
Chloride: 104 mmol/L (ref 101–111)
Creatinine, Ser: 1.57 mg/dL — ABNORMAL HIGH (ref 0.44–1.00)
GFR, EST AFRICAN AMERICAN: 39 mL/min — AB (ref >60)
GFR, EST NON AFRICAN AMERICAN: 34 mL/min — AB (ref >60)
Glucose, Bld: 296 mg/dL — ABNORMAL HIGH (ref 65–99)
POTASSIUM: 5.6 mmol/L — AB (ref 3.5–5.1)
Sodium: 134 mmol/L — ABNORMAL LOW (ref 135–145)
TOTAL PROTEIN: 6.8 g/dL (ref 6.5–8.1)

## 2016-09-18 LAB — CREATININE, SERUM
CREATININE: 1.58 mg/dL — AB (ref 0.44–1.00)
GFR calc non Af Amer: 34 mL/min — ABNORMAL LOW (ref >60)
GFR, EST AFRICAN AMERICAN: 39 mL/min — AB (ref >60)

## 2016-09-18 LAB — POTASSIUM: Potassium: 4.9 mmol/L (ref 3.5–5.1)

## 2016-09-18 MED ORDER — CYANOCOBALAMIN 1000 MCG/ML IJ SOLN
INTRAMUSCULAR | Status: AC
  Filled 2016-09-18: qty 1

## 2016-09-18 MED ORDER — CYANOCOBALAMIN 1000 MCG/ML IJ SOLN
1000.0000 ug | INTRAMUSCULAR | Status: DC
  Administered 2016-09-18: 1000 ug via INTRAMUSCULAR

## 2016-09-18 MED ORDER — SODIUM CHLORIDE 0.9 % IV SOLN: 20.0000 mg | Freq: Once | INTRAVENOUS | Status: DC

## 2016-09-18 MED ORDER — PACLITAXEL CHEMO INJECTION 300 MG/50ML
175.0000 mg/m2 | Freq: Once | INTRAVENOUS | Status: AC
  Administered 2016-09-18: 366 mg via INTRAVENOUS
  Filled 2016-09-18: qty 61

## 2016-09-18 MED ORDER — SODIUM CHLORIDE 0.9 % IV SOLN
Freq: Once | INTRAVENOUS | Status: AC
  Administered 2016-09-18: 11:00:00 via INTRAVENOUS

## 2016-09-18 MED ORDER — DIPHENHYDRAMINE HCL 50 MG/ML IJ SOLN
50.0000 mg | Freq: Once | INTRAMUSCULAR | Status: AC
  Administered 2016-09-18: 50 mg via INTRAVENOUS
  Filled 2016-09-18: qty 1

## 2016-09-18 MED ORDER — HEPARIN SOD (PORK) LOCK FLUSH 100 UNIT/ML IV SOLN
500.0000 [IU] | Freq: Once | INTRAVENOUS | Status: AC | PRN
  Administered 2016-09-18: 500 [IU]
  Filled 2016-09-18 (×3): qty 5

## 2016-09-18 MED ORDER — SODIUM CHLORIDE 0.9% FLUSH: 10.0000 mL | INTRAVENOUS | Status: DC | PRN

## 2016-09-18 MED ORDER — CARBOPLATIN CHEMO INJECTION 600 MG/60ML
485.4000 mg | Freq: Once | INTRAVENOUS | Status: AC
  Administered 2016-09-18: 490 mg via INTRAVENOUS
  Filled 2016-09-18: qty 49

## 2016-09-18 MED ORDER — HYDROCODONE-ACETAMINOPHEN 10-325 MG PO TABS: 1.0000 | ORAL_TABLET | ORAL | 0 refills | Status: DC | PRN

## 2016-09-18 MED ORDER — PEGFILGRASTIM 6 MG/0.6ML ~~LOC~~ PSKT
6.0000 mg | PREFILLED_SYRINGE | Freq: Once | SUBCUTANEOUS | Status: AC
  Administered 2016-09-18: 6 mg via SUBCUTANEOUS
  Filled 2016-09-18: qty 0.6

## 2016-09-18 MED ORDER — SODIUM CHLORIDE 0.9 % IV SOLN
Freq: Once | INTRAVENOUS | Status: AC
  Administered 2016-09-18: 12:00:00 via INTRAVENOUS

## 2016-09-18 MED ORDER — FAMOTIDINE IN NACL 20-0.9 MG/50ML-% IV SOLN
20.0000 mg | Freq: Once | INTRAVENOUS | Status: AC
  Administered 2016-09-18: 20 mg via INTRAVENOUS
  Filled 2016-09-18: qty 50

## 2016-09-18 MED ORDER — SODIUM CHLORIDE 0.9 % IV SOLN
20.0000 mg | Freq: Once | INTRAVENOUS | Status: AC
  Administered 2016-09-18: 20 mg via INTRAVENOUS
  Filled 2016-09-18: qty 2

## 2016-09-18 MED ORDER — PALONOSETRON HCL INJECTION 0.25 MG/5ML
0.2500 mg | Freq: Once | INTRAVENOUS | Status: AC
  Administered 2016-09-18: 0.25 mg via INTRAVENOUS
  Filled 2016-09-18: qty 5

## 2016-09-18 MED ORDER — DIAZEPAM 5 MG PO TABS: 2.5000 mg | ORAL_TABLET | ORAL | 1 refills | Status: DC | PRN

## 2016-09-18 NOTE — Patient Instructions (Signed)
[facility] Discharge Instructions for Patients Receiving Chemotherapy   Beginning January 23rd 2017 lab work for the Cancer Center will be done in the  Main lab at [place] on 1st floor. If you have a lab appointment with the Cancer Center please come in thru the  Main Entrance and check in at the main information desk   Today you received the following chemotherapy agents:  Taxol and carboplatin  If you develop nausea and vomiting, or diarrhea that is not controlled by your medication, call the clinic.  The clinic phone number is [phone]. Office hours are Monday-Friday 8:30am-5:00pm.  BELOW ARE SYMPTOMS THAT SHOULD BE REPORTED IMMEDIATELY:  *FEVER GREATER THAN 101.0 F  *CHILLS WITH OR WITHOUT FEVER  NAUSEA AND VOMITING THAT IS NOT CONTROLLED WITH YOUR NAUSEA MEDICATION  *UNUSUAL SHORTNESS OF BREATH  *UNUSUAL BRUISING OR BLEEDING  TENDERNESS IN MOUTH AND THROAT WITH OR WITHOUT PRESENCE OF ULCERS  *URINARY PROBLEMS  *BOWEL PROBLEMS  UNUSUAL RASH Items with * indicate a potential emergency and should be followed up as soon as possible. If you have an emergency after office hours please contact your primary care physician or go to the nearest emergency department.  Please call the clinic during office hours if you have any questions or concerns.   You may also contact the Patient Navigator at [phone] should you have any questions or need assistance in obtaining follow up care.      Resources For Cancer Patients and their Caregivers ? [employer]: Can assist with transportation, wigs, general needs, runs Look Good Feel Better.        1-888-227-6333 ? Cancer Care: Provides financial assistance, online support groups, medication/co-pay assistance.  1-800-813-HOPE (4673) ? [facility] Assists [place] cancer patients and their families through emotional , educational and financial support.   336-427-4357 ? [place] DSS Where to apply for food stamps, Medicaid and utility assistance. 336-342-1394 ? [facility]: Transportation to medical appointments. 336-347-2287 ? Social Security Administration: May apply for disability if have a Stage IV cancer. 336-342-7796 1-800-772-1213 ? [place] [facility], [facility] Transit [facility]: Assists with nutrition, care and transit needs. 336-349-2343         

## 2016-09-18 NOTE — Progress Notes (Signed)
Dr. Whitney Muse aware of labs - okay to tx.  NS 500 ml bolus ordered per MD d/t creatinine. Will recheck creatinine after bolus per MD orders.    Tolerated tx w/o adverse reaction.  Alert, in no distress.  VSS.  Neulasta on body injector in place LUE.  See MAR for administration details. Injector in place and engaged with green light indicator on flashing. Tolerated application with out problems.  Discharged ambulatory in c/o spouse.

## 2016-09-18 NOTE — Progress Notes (Signed)
[facility] Hematology/Oncology Progress Note   Name: Ellen Hunt      MRN: [id number]    Date: 10/26/2016 Time:10:25 AM   REFERRING PHYSICIAN:  Milton Ferguson, MD (ED physcian)   DIAGNOSIS:  Ovarian Carcinoma    Ovarian cancer ([name])   04/29/2016 Imaging    CT abd/pelvis- Extensive omental caking as well as moderate amount of ascites within the abdomen most compatible with peritoneal metastatic disease, of unknown primary. This may potentially be ovarian or a GI in etiology.      04/30/2016 Tumor Marker    CA 125- 7149.0 (H)      05/01/2016 Procedure    US paracentesis- Successful ultrasound-guided paracentesis yielding 1.8 liters of peritoneal fluid.      05/01/2016 Imaging    US pelvis- Both transabdominal and transvaginal sonography are significantly limited by large patient habitus and ascites. Neither uterus or ovaries were visualized on this exam.      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      05/08/2016 Imaging    CT chest- No evidence of metastatic disease in the chest. Peritoneal/omental disease with abdominal ascites in the upper abdomen, incompletely visualized.       05/13/2016 Procedure    Placement of single lumen port a cath via right internal jugular vein. The catheter tip lies at the cavoatrial junction. A power injectable port a cath was placed and is ready for immediate use.      05/15/2016 Procedure    US Paracentesis- 3400 ml yellow colored ascites removed      05/15/2016 - 06/26/2016 Chemotherapy    Carboplatin/Paclitaxel every 21 days x 3 cycles      07/01/2016 Miscellaneous    Genetic Counseling by [name]-  Genetic testing was normal, and did not reveal a deleterious mutation in these genes.       07/08/2016 Imaging    CT CAP- 1. Small volume ascites, significantly decreased. 2. Stable diffuse  omental soft tissue caking and diffuse peritoneal thickening along the bilateral paracolic gutters and bilateral pelvic peritoneal reflections, consistent with peritoneal carcinomatosis. 3. Stable asymmetrically enlarged right ovary, which may represent the primary site of ovarian malignancy. 4. No evidence of metastatic disease in the chest. No new sites of metastatic disease in the abdomen or pelvis.      07/09/2016 Miscellaneous    Gyn Onc re-evaluation- modest response to therapy, 3 more cycles of chemotherapy recommended.        07/17/2016 -  Chemotherapy    The patient had palonosetron (ALOXI) injection 0.25 mg, 0.25 mg, Intravenous,  Once, 3 of 6 cycles  CARBOplatin (PARAPLATIN) 500 mg in sodium chloride 0.9 % 250 mL chemo infusion, 500 mg (100 % of original dose 496 mg), Intravenous,  Once, 3 of 6 cycles Dose modification:   (original dose 496 mg, Cycle 1), 750 mg (original dose 496 mg, Cycle 3, Reason: Provider Judgment),   (original dose 496 mg, Cycle 4, Reason: Change in SCr/CrCl)  PACLitaxel (TAXOL) 396 mg in dextrose 5 % 500 mL chemo infusion (> 30m/m2), 175 mg/m2 = 396 mg, Intravenous,  Once, 3 of 6 cycles  for chemotherapy treatment.        09/11/2016 Imaging    CT C/A/P No significant change omental soft tissue caking, consistent with metastatic disease. Mild ascites is decreased since previous study.  Increased calcification along peritoneal  surface in pelvic cul-de-sac, consistent with treated peritoneal metastatic disease.  Stable 4.5cm homogeneous right pelvic mass, which favors a uterine fibroid although right ovarian neoplasm cannot definitely be excluded.  No new or progressive metastatic disease identified. No evidence of metastatic disease within the thorax.         HISTORY OF PRESENT ILLNESS:   Ellen Hunt is a 65 y.o. female with a diagnosis of ovarian carcinoma. She presents today for ongoing follow-up. She will finish cycle #7 of  carboplatin/taxol today.   Patient states she has been taking Advil because her hands are swollen and achy. Symptoms are not tingling. She notes that her feet are fine. She is diabetic but currently denies symptoms c/w neuropathy.   Kollins's surgery is December 12.  Appetite is good. Energy is ok. Blood sugars are up and down. She manages these with a pump. No other complaints today.  Review of Systems  Constitutional: Negative for chills, fever, malaise/fatigue and weight loss.  HENT: Negative.   Respiratory: Negative for cough, sputum production and wheezing.   Cardiovascular: Positive for leg swelling.  Gastrointestinal: Negative for nausea and vomiting.  Genitourinary: Negative.   Musculoskeletal: Positive for joint pain.       Pain in fingers  Skin: Negative.   Neurological: Negative.   Endo/Heme/Allergies: Negative.   Psychiatric/Behavioral: Negative.    14 point review of systems was performed and is negative except as detailed under history of present illness and above  PAST MEDICAL HISTORY:   Past Medical History:  Diagnosis Date  . Abnormal CT of the abdomen 04/30/2016  . Diabetes mellitus without complication ([place])   . Dysrhythmia   . Family history of breast cancer   . GERD (gastroesophageal reflux disease)   . History of blood transfusion   . History of bronchitis   . History of chemotherapy   . History of urinary tract infection   . Hyperlipidemia   . Hypertension   . Low serum vitamin D   . Ovarian cancer ([place]) 05/09/2016  . Shingles     ALLERGIES: Allergies  Allergen Reactions  . Avandia [Rosiglitazone] Other (See Comments)    Legs swelled  . Micronase [Glyburide] Swelling  . Actos [Pioglitazone] Other (See Comments)    Edema / leg swelling      MEDICATIONS: I have reviewed the patient's current medications.    Current Outpatient Prescriptions on File Prior to Visit  Medication Sig Dispense Refill  . aspirin 81 MG tablet Take 1 tablet (81 mg  total) by mouth daily. (Patient taking differently: Take 81 mg by mouth every evening. ) 30 tablet   . CARBOPLATIN IV Inject into the vein. Every 21 days    . cephALEXin (KEFLEX) 500 MG capsule Take 2 capsules (1,000 mg total) by mouth 2 (two) times daily. 20 capsule 0  . dexamethasone (DECADRON) 4 MG tablet The day before chemo take 5 tabs (61m total) in the am and 5 tabs (272mtotal) in the pm. The morning of chemo take 5 tabs (2059motal). 45 tablet 1  . furosemide (LASIX) 20 MG tablet Take 1 tablet (20 mg total) by mouth daily. 90 tablet 1  . glucagon (GLUCAGON EMERGENCY) 1 MG injection Inject as directed as needed for low blood glucose if unresponsive. 1 each 0  . glucosamine-chondroitin 500-400 MG tablet Take 1 tablet by mouth 2 (two) times daily.    . gMarland Kitchenucose blood (ONE TOUCH ULTRA TEST) test strip USE TO CHECK BLOOD SUGAR UP TO 5 TIMES  A DAY 450 each 2  . lidocaine-prilocaine (EMLA) cream Apply a quarter size amount to port site 1 hour prior to chemo. Do not rub in. Cover with plastic wrap. 30 g 3  . metFORMIN (GLUCOPHAGE) 1000 MG tablet TAKE 1 TABLET BY MOUTH TWICE DAILY WITH A MEAL 60 tablet 1  . Multiple Vitamin (MULTIVITAMIN WITH MINERALS) TABS Take 1 tablet by mouth daily.    Marland Kitchen PACLitaxel (TAXOL IV) Inject into the vein. Every 21 days     No current facility-administered medications on file prior to visit.      PAST SURGICAL HISTORY Past Surgical History:  Procedure Laterality Date  . CESAREAN SECTION    . DEBULKING N/A 10/14/2016   Procedure: DEBULKING;  Surgeon: Everitt Amber, MD;  Location: [facility];  Service: Gynecology;  Laterality: N/A;  . LAPAROTOMY WITH STAGING N/A 10/14/2016   Procedure: LAPAROTOMY WITH OMENTECTOMY AND TUMOR DEBULGING;  Surgeon: Everitt Amber, MD;  Location: [facility];  Service: Gynecology;  Laterality: N/A;  . LUMBAR FUSION  08/21/15   L3-L4 Dr. Timmothy Euler  . OMENTECTOMY N/A 10/14/2016   Procedure: OMENTECTOMY;  Surgeon: Everitt Amber, MD;  Location: [facility];   Service: Gynecology;  Laterality: N/A;  . ROBOTIC ASSISTED TOTAL HYSTERECTOMY WITH BILATERAL SALPINGO OOPHERECTOMY Bilateral 10/14/2016   Procedure: XI ROBOTIC ASSISTED TOTAL LAPARSCOPIC  HYSTERECTOMY WITH BILATERAL SALPINGO OOPHORECTOMY;  Surgeon: Everitt Amber, MD;  Location: [facility];  Service: Gynecology;  Laterality: Bilateral;    FAMILY HISTORY: Family History  Problem Relation Age of Onset  . Lung cancer Mother     smoker; dx in her 64s  . Leukemia Father   . Diabetes Paternal Grandmother   . Heart attack Paternal Grandmother   . Diabetes Paternal Grandfather   . Breast cancer Paternal Aunt     dx in her 60s-30s  . Leukemia Paternal Uncle   . Heart attack Maternal Grandfather   . Breast cancer Cousin     maternal first cousin    SOCIAL HISTORY:  reports that she has never smoked. She has never used smokeless tobacco. She reports that she does not drink alcohol or use drugs.  Social History   Social History  . Marital status: Married    Spouse name: Gershon Mussel  . Number of children: 2  . Years of education: N/A   Social History Main Topics  . Smoking status: Never Smoker  . Smokeless tobacco: Never Used  . Alcohol use No  . Drug use: No  . Sexual activity: Not Asked   Other Topics Concern  . None   Social History Narrative  . None    PERFORMANCE STATUS: The patient's performance status is 1 - Symptomatic but completely ambulatory   Vitals with BMI 09/18/2016  Height   Weight 215 lbs 6 oz  BMI   Systolic 376  Diastolic 78  Pulse 97  Respirations 18   General appearance: alert, cooperative, appears stated age, no distress, moderately obese and accompanied by her husband.  Head: Normocephalic, without obvious abnormality, atraumatic Throat: lips, mucosa, and tongue normal; teeth and gums normal Neck: no adenopathy and supple, symmetrical, trachea midline Lungs: clear to auscultation bilaterally and normal percussion bilaterally Heart: regular rate and rhythm,  S1, S2 normal, no murmur, click, rub or gallop Abdomen: abnormal findings:  ascites, distended, obese, insulin pump Extremities: swollen digits, chronic edema B/L LE edema, trace Skin: Skin color, texture, turgor normal. No rashes or lesions Lymph nodes: Cervical, supraclavicular, and axillary nodes normal. Neurologic: Grossly normal  LABORATORY DATA:  Results for SANYAH, MOLNAR (MRN [id number]) as of 08/28/2016 18:45  Ref. Range 06/05/2016 09:19 06/12/2016 09:56 06/26/2016 09:30 07/17/2016 08:49 08/07/2016 09:13  CA 125 Latest Ref Range: 0.0 - 38.1 U/mL 12,359.0 (H) 12,650.0 (H) 8,811.0 (H) 5,202.0 (H) 2,751.0 (H)   Results for VENETA, SLITER (MRN [id number]) as of 09/18/2016 11:18  Ref. Range 09/18/2016 09:19  Sodium Latest Ref Range: 135 - 145 mmol/L 134 (L)  Potassium Latest Ref Range: 3.5 - 5.1 mmol/L 5.6 (H)  Chloride Latest Ref Range: 101 - 111 mmol/L 104  CO2 Latest Ref Range: 22 - 32 mmol/L 22  BUN Latest Ref Range: 6 - 20 mg/dL 43 (H)  Creatinine Latest Ref Range: 0.44 - 1.00 mg/dL 1.57 (H)  Calcium Latest Ref Range: 8.9 - 10.3 mg/dL 9.7  EGFR (Non-African Amer.) Latest Ref Range: >60 mL/min 34 (L)  EGFR (African American) Latest Ref Range: >60 mL/min 39 (L)  Glucose Latest Ref Range: 65 - 99 mg/dL 296 (H)  Anion gap Latest Ref Range: 5 - 15  8  Alkaline Phosphatase Latest Ref Range: 38 - 126 U/L 77  Albumin Latest Ref Range: 3.5 - 5.0 g/dL 3.8  AST Latest Ref Range: 15 - 41 U/L 24  ALT Latest Ref Range: 14 - 54 U/L 19  Total Protein Latest Ref Range: 6.5 - 8.1 g/dL 6.8  Total Bilirubin Latest Ref Range: 0.3 - 1.2 mg/dL 0.5  WBC Latest Ref Range: 4.0 - 10.5 K/uL 6.5  RBC Latest Ref Range: 3.87 - 5.11 MIL/uL 2.81 (L)  Hemoglobin Latest Ref Range: 12.0 - 15.0 g/dL 9.4 (L)  HCT Latest Ref Range: 36.0 - 46.0 % 27.9 (L)  MCV Latest Ref Range: 78.0 - 100.0 fL 99.3  MCH Latest Ref Range: 26.0 - 34.0 pg 33.5  MCHC Latest Ref Range: 30.0 - 36.0 g/dL 33.7  RDW Latest Ref Range: 11.5 -  15.5 % 17.0 (H)  Platelets Latest Ref Range: 150 - 400 K/uL 108 (L)  Neutrophils Latest Units: % 92  Lymphocytes Latest Units: % 6  Monocytes Relative Latest Units: % 2  Eosinophil Latest Units: % 0  Basophil Latest Units: % 0  NEUT# Latest Ref Range: 1.7 - 7.7 K/uL 6.0  Lymphocyte # Latest Ref Range: 0.7 - 4.0 K/uL 0.4 (L)  Monocyte # Latest Ref Range: 0.1 - 1.0 K/uL 0.1  Eosinophils Absolute Latest Ref Range: 0.0 - 0.7 K/uL 0.0  Basophils Absolute Latest Ref Range: 0.0 - 0.1 K/uL 0.0    RADIOGRAPHY: No results found.   I have reviewed the data as listed below.   Study Result   CLINICAL DATA:  Followup metastatic ovarian carcinoma. Undergoing chemotherapy. Restaging.  EXAM: CT CHEST, ABDOMEN, AND PELVIS WITH CONTRAST  TECHNIQUE: Multidetector CT imaging of the chest, abdomen and pelvis was performed following the standard protocol during bolus administration of intravenous contrast.  CONTRAST:  116m ISOVUE-300 IOPAMIDOL (ISOVUE-300) INJECTION 61%  COMPARISON:  07/08/2016 and 04/29/2016  FINDINGS: CT CHEST FINDINGS  Cardiovascular: No acute findings. Aortic atherosclerosis. Coronary artery calcification. Right-sided Port-A-Cath remains in place.  Mediastinum/Lymph Nodes: No masses or pathologically enlarged lymph nodes identified. Stable mild multinodular goiter.  Lungs/Pleura: No pulmonary infiltrate or mass identified. No effusion present.  Musculoskeletal: No suspicious bone lesions or other significant abnormality.  CT ABDOMEN AND PELVIS FINDINGS  Hepatobiliary: No masses identified. Gallbladder is unremarkable.  Pancreas:  No mass or inflammatory changes.  Spleen:  Within normal limits in size and appearance.  Adrenals/Urinary tract: No masses or  hydronephrosis. Stable 4 mm nonobstructive calculus in lower pole of left kidney. Unremarkable urinary bladder.  Stomach/Bowel: No evidence of obstruction, inflammatory process, or abnormal  fluid collections.  Vascular/Lymphatic: No pathologically enlarged lymph nodes identified. No abdominal aortic aneurysm. Aortic atherosclerosis.  Reproductive: Several tiny calcified uterine fibroids noted. A homogeneous masses seen along the right lateral margin of the uterus which measures 3.5 x 4.5 cm. This remains stable compared to previous studies, and favors a fibroid over ovarian mass.  Other: Soft tissue caking involving the omentum shows no significant interval change. Mild ascites shows mild decrease since previous study. Increased calcification is seen along the peritoneal surface in the pelvic cul-de-sac, consistent with treated peritoneal metastatic disease.  Musculoskeletal: No suspicious bone lesions identified. Lumbar spine fusion hardware and degenerative spondylosis again noted.  IMPRESSION: No significant change omental soft tissue caking, consistent with metastatic disease. Mild ascites is decreased since previous study.  Increased calcification along peritoneal surface in pelvic cul-de-sac, consistent with treated peritoneal metastatic disease.  Stable 4.5cm homogeneous right pelvic mass, which favors a uterine fibroid although right ovarian neoplasm cannot definitely be excluded.  No new or progressive metastatic disease identified. No evidence of metastatic disease within the thorax.   Electronically Signed   By: Earle Gell M.D.   On: 09/11/2016 10:42     PATHOLOGY:    ASSESSMENT/PLAN:  Stage IIIC ovarian/primary peritoneal/fallopian tube cancer High Grade Ascites Carcinomatosis Shingles Chemotherapy induced anemia Macrocytosis Constipation Thrombocytopenia ARF  She will complete cycle #7 of carboplatin/taxol today. Disease is unfortunately somewhat platinum resistant. She does have upcoming surgery planned with Dr. Denman George in early December.  Will monitor her CBC prior to surgery and transfuse if needed. She has hand pain but  describes it as joint pain. Will continue to monitor closely for neuropathy especially given her underlying diabetes.   ARF -- elevated creatinine. Patient was taking Advil on a consistent basis over the past several weeks for her joint pain which has affected her creatine. I strongly recommended she avoid taking Advil, Aleve, or any other NSAID.  I recommended she take hydrocodone to manage pain.  I will have her set up for blood work so I can examine blood counts and CEA 125.  I have refilled patient's hydrocodone.  Will follow-up post surgery.   Meds ordered this encounter  Medications  . HYDROcodone-acetaminophen (NORCO) 10-325 MG tablet    Sig: Take 1 tablet by mouth every 4 (four) hours as needed.    Dispense:  90 tablet    Refill:  0  . diazepam (VALIUM) 5 MG tablet    Sig: Take 0.5 tablets (2.5 mg total) by mouth every 4 (four) hours as needed (spasms).    Dispense:  30 tablet    Refill:  1    Orders Placed This Encounter  Procedures  . CBC with Differential    Standing Status:   Standing    Number of Occurrences:   6    Standing Expiration Date:   09/18/2017  . Sample to Blood Bank    Standing Status:   Standing    Number of Occurrences:   6    Standing Expiration Date:   09/18/2017   All questions were answered. The patient knows to call the clinic with any problems, questions or concerns. We can certainly see the patient much sooner if necessary.  This document serves as a record of services personally performed by Ancil Linsey, MD. It was created on her behalf by Elmyra Ricks,  a trained medical scribe. The creation of this record is based on the scribe's personal observations and the provider's statements to them. This document has been checked and approved by the attending provider.  I have reviewed the above documentation for accuracy and completeness and I agree with the above.  This note is electronically signed Molli Hazard, MD  10/26/2016 10:25  AM

## 2016-09-18 NOTE — Patient Instructions (Addendum)
[place] at [facility] Discharge Instructions  RECOMMENDATIONS MADE BY THE CONSULTANT AND ANY TEST RESULTS WILL BE SENT TO YOUR REFERRING PHYSICIAN.  Exam with Dr.  Muse today. Dr.  Muse does not want you to take any more NSAIDS.  Please take your prescribed pain medication for pain management.  We have given you a refill on this today as well as the valium.   We will be checking you blood level weekly to make sure you are doing well and don't need any blood.  They will do this downstairs in the lab.   Please see Amy for appointment days and times.    Thank you for choosing [place] at [facility] to provide your oncology and hematology care.  To afford each patient quality time with our provider, please arrive at least 15 minutes before your scheduled appointment time.   Beginning January 23rd 2017 lab work for the [facility] will be done in the  Main lab at [facility] on 1st floor. If you have a lab appointment with the [facility] please come in thru the  Main Entrance and check in at the main information desk  You need to re-schedule your appointment should you arrive 10 or more minutes late.  We strive to give you quality time with our providers, and arriving late affects you and other patients whose appointments are after yours.  Also, if you no show three or more times for appointments you may be dismissed from the clinic at the providers discretion.     Again, thank you for choosing [facility].  Our hope is that these requests will decrease the amount of time that you wait before being seen by our physicians.       _____________________________________________________________  Should you have questions after your visit to [facility], please contact our office at [phone] between the hours of 8:30 a.m. and 4:30 p.m.  Voicemails left after 4:30 p.m. will not be returned until the following  business day.  For prescription refill requests, have your pharmacy contact our office.         Resources For Cancer Patients and their Caregivers ? American Cancer Society: Can assist with transportation, wigs, general needs, runs Look Good Feel Better.        (506)630-8548 ? Cancer Care: Provides financial assistance, online support groups, medication/co-pay assistance.  1-800-813-HOPE 270-146-2748) ? [facility] Assists [place] cancer patients and their families through emotional , educational and financial support.  (309)411-7712 ? [place] DSS Where to apply for food stamps, Medicaid and utility assistance. 619 381 2722 ? [facility]: Transportation to medical appointments. 906-278-4043 ? Social Security Administration: May apply for disability if have a Stage IV cancer. 325-479-0192 (657) 512-7769 ? [employer], Disability and Transit Services: Assists with nutrition, care and transit needs. [place] Support Programs: @10RELATIVEDAYS @ > Cancer Support Group  2nd Tuesday of the month 1pm-2pm, Journey Room  > Creative Journey  3rd Tuesday of the month 1130am-1pm, Journey Room  > Look Good Feel Better  1st Wednesday of the month 10am-12 noon, Journey Room (Call [place] to register [phone])

## 2016-09-19 ENCOUNTER — Telehealth: Payer: Self-pay | Admitting: Family Medicine

## 2016-09-19 ENCOUNTER — Telehealth: Payer: Self-pay

## 2016-09-19 LAB — CA 125: CA 125: 1445 U/mL — ABNORMAL HIGH (ref 0.0–38.1)

## 2016-09-19 NOTE — Telephone Encounter (Signed)
I did try to determine if [facility] or [facility] hospitals have a policy regarding inpatient use of insulin pumps - awaiting call back.  Recommend patient removed pump the morning of surgery.  Start insulin per hospital protocol.  I would recommend checking BG every 2 hours prior to surgery and during surgery.   Restart insulin after surgery per hospital protocol.   Once patient is able to eat / is no longer NPO then recommend the following:   -Either restart insulin pump (avoiding any surgical areas) Or   - If basal is available then patient would need about 50 units (per her last pump report she received about 56 units per day from her basal rate).  Check BG prior to meals and give based on sliding scale of hospital protocol.  Left message on VM of Melissa

## 2016-09-19 NOTE — Telephone Encounter (Signed)
Discussed plan with Ellen Hunt at [facility].  Plan is to stop pump prior to surgery and administer insulin per hospital protocol.  Restart pump avoiding surgical areas once she is able to eat food PO.

## 2016-09-19 NOTE — Telephone Encounter (Signed)
Call returned to [facility]: [phone] Dr Tawanna Sat Fabio Neighbors in regards to instructions for the patient's insulin pump prior to surgery with Dr Everitt Amber. Tammy states the patient will be instructed to "stop" the insulin pump the day of surgery and the hospital will monitor her glucose levels per P & P. Tammy states the patient may restart the insulin pump after she starts eating solids and that she should avoid using her stomach , arms or legs are prefered. Patient will be given specific instruction per Dr Tawanna Sat recommendations, Joylene John, APNP was updated with recommendations for insulin pump.

## 2016-09-20 ENCOUNTER — Other Ambulatory Visit: Payer: Self-pay | Admitting: Family Medicine

## 2016-09-22 NOTE — Telephone Encounter (Signed)
Discussed recommendations during surgery.  Patient would like to come in to discuss pump placement during recovery from ovarian debulking surgery.  Appt made for 10/02/16

## 2016-09-24 ENCOUNTER — Encounter (HOSPITAL_BASED_OUTPATIENT_CLINIC_OR_DEPARTMENT_OTHER): Payer: BC Managed Care – PPO

## 2016-09-24 ENCOUNTER — Encounter (HOSPITAL_COMMUNITY): Payer: BC Managed Care – PPO | Attending: Hematology & Oncology

## 2016-09-24 VITALS — BP 141/65 | HR 105 | Temp 97.6°F | Resp 18

## 2016-09-24 DIAGNOSIS — E538 Deficiency of other specified B group vitamins: Secondary | ICD-10-CM | POA: Diagnosis not present

## 2016-09-24 DIAGNOSIS — C569 Malignant neoplasm of unspecified ovary: Secondary | ICD-10-CM

## 2016-09-24 DIAGNOSIS — C561 Malignant neoplasm of right ovary: Secondary | ICD-10-CM | POA: Insufficient documentation

## 2016-09-24 LAB — CBC WITH DIFFERENTIAL/PLATELET
BASOS ABS: 0 10*3/uL (ref 0.0–0.1)
BASOS PCT: 1 %
EOS ABS: 0.1 10*3/uL (ref 0.0–0.7)
EOS PCT: 7 %
HCT: 27.5 % — ABNORMAL LOW (ref 36.0–46.0)
Hemoglobin: 9.1 g/dL — ABNORMAL LOW (ref 12.0–15.0)
Lymphocytes Relative: 20 %
Lymphs Abs: 0.4 10*3/uL — ABNORMAL LOW (ref 0.7–4.0)
MCH: 33.1 pg (ref 26.0–34.0)
MCHC: 33.1 g/dL (ref 30.0–36.0)
MCV: 100 fL (ref 78.0–100.0)
Monocytes Absolute: 0.6 10*3/uL (ref 0.1–1.0)
Monocytes Relative: 29 %
Neutro Abs: 0.8 10*3/uL — ABNORMAL LOW (ref 1.7–7.7)
Neutrophils Relative %: 43 %
PLATELETS: 88 10*3/uL — AB (ref 150–400)
RBC: 2.75 MIL/uL — AB (ref 3.87–5.11)
RDW: 17.5 % — AB (ref 11.5–15.5)
WBC Morphology: INCREASED
WBC: 1.9 10*3/uL — AB (ref 4.0–10.5)

## 2016-09-24 LAB — SAMPLE TO BLOOD BANK

## 2016-09-24 MED ORDER — CYANOCOBALAMIN 1000 MCG/ML IJ SOLN
INTRAMUSCULAR | Status: AC
  Filled 2016-09-24: qty 1

## 2016-09-24 MED ORDER — CYANOCOBALAMIN 1000 MCG/ML IJ SOLN
1000.0000 ug | INTRAMUSCULAR | Status: DC
  Administered 2016-09-24: 1000 ug via INTRAMUSCULAR

## 2016-09-24 NOTE — Progress Notes (Signed)
Labs reviewed with Dr.Penland. No Ellen Hunt orders or changes to care plan.  Ellen Hunt presents today for injection per MD orders. B12 1000 mcg administered IM in right Upper Arm. Administration without incident. Patient tolerated well.

## 2016-09-24 NOTE — Patient Instructions (Signed)
 [place] at [facility] Discharge Instructions  RECOMMENDATIONS MADE BY THE CONSULTANT AND ANY TEST RESULTS WILL BE SENT TO YOUR REFERRING PHYSICIAN.  Vitamin B12 1000 mcg injection given as ordered.  Thank you for choosing [place] at [facility] to provide your oncology and hematology care.  To afford each patient quality time with our provider, please arrive at least 15 minutes before your scheduled appointment time.   Beginning January 23rd 2017 lab work for the [facility] will be done in the  Main lab at [facility] on 1st floor. If you have a lab appointment with the [facility] please come in thru the  Main Entrance and check in at the main information desk  You need to re-schedule your appointment should you arrive 10 or more minutes late.  We strive to give you quality time with our providers, and arriving late affects you and other patients whose appointments are after yours.  Also, if you no show three or more times for appointments you may be dismissed from the clinic at the providers discretion.     Again, thank you for choosing [facility].  Our hope is that these requests will decrease the amount of time that you wait before being seen by our physicians.       _____________________________________________________________  Should you have questions after your visit to [facility], please contact our office at [phone] between the hours of 8:30 a.m. and 4:30 p.m.  Voicemails left after 4:30 p.m. will not be returned until the following business day.  For prescription refill requests, have your pharmacy contact our office.         Resources For Cancer Patients and their Caregivers ? American Cancer Society: Can assist with transportation, wigs, general needs, runs Look Good Feel Better.        978-626-0497 ? Cancer Care: Provides financial assistance, online support groups,  medication/co-pay assistance.  1-800-813-HOPE (256)628-9548) ? [facility] Assists [place] cancer patients and their families through emotional , educational and financial support.  (819) 425-0715 ? [place] DSS Where to apply for food stamps, Medicaid and utility assistance. 743 872 1555 ? RCATS: Transportation to medical appointments. (726) 016-1387 ? Social Security Administration: May apply for disability if have a Stage IV cancer. (704)718-3002 (725)680-4742 ? [employer], Disability and Transit Services: Assists with nutrition, care and transit needs. [place] Support Programs: @10RELATIVEDAYS @ > Cancer Support Group  2nd Tuesday of the month 1pm-2pm, Journey Room  > Creative Journey  3rd Tuesday of the month 1130am-1pm, Journey Room  > Look Good Feel Better  1st Wednesday of the month 10am-12 noon, Journey Room (Call [facility] to register [phone])

## 2016-10-01 ENCOUNTER — Other Ambulatory Visit (HOSPITAL_COMMUNITY): Payer: Self-pay | Admitting: Oncology

## 2016-10-01 ENCOUNTER — Encounter (HOSPITAL_COMMUNITY): Payer: BC Managed Care – PPO

## 2016-10-01 DIAGNOSIS — C569 Malignant neoplasm of unspecified ovary: Secondary | ICD-10-CM

## 2016-10-01 DIAGNOSIS — E538 Deficiency of other specified B group vitamins: Secondary | ICD-10-CM | POA: Diagnosis not present

## 2016-10-01 DIAGNOSIS — T451X5A Adverse effect of antineoplastic and immunosuppressive drugs, initial encounter: Principal | ICD-10-CM

## 2016-10-01 DIAGNOSIS — D6481 Anemia due to antineoplastic chemotherapy: Secondary | ICD-10-CM

## 2016-10-01 LAB — CBC WITH DIFFERENTIAL/PLATELET
Basophils Absolute: 0 10*3/uL (ref 0.0–0.1)
Basophils Relative: 0 %
EOS ABS: 0 10*3/uL (ref 0.0–0.7)
Eosinophils Relative: 1 %
HCT: 25.3 % — ABNORMAL LOW (ref 36.0–46.0)
HEMOGLOBIN: 8.5 g/dL — AB (ref 12.0–15.0)
LYMPHS ABS: 0.6 10*3/uL — AB (ref 0.7–4.0)
LYMPHS PCT: 7 %
MCH: 33.9 pg (ref 26.0–34.0)
MCHC: 33.6 g/dL (ref 30.0–36.0)
MCV: 100.8 fL — AB (ref 78.0–100.0)
Monocytes Absolute: 0.9 10*3/uL (ref 0.1–1.0)
Monocytes Relative: 10 %
NEUTROS ABS: 7.4 10*3/uL (ref 1.7–7.7)
NEUTROS PCT: 83 %
Platelets: 93 10*3/uL — ABNORMAL LOW (ref 150–400)
RBC: 2.51 MIL/uL — AB (ref 3.87–5.11)
RDW: 18.2 % — ABNORMAL HIGH (ref 11.5–15.5)
WBC: 8.9 10*3/uL (ref 4.0–10.5)

## 2016-10-01 LAB — PREPARE RBC (CROSSMATCH)

## 2016-10-02 ENCOUNTER — Telehealth: Payer: Self-pay | Admitting: Gynecologic Oncology

## 2016-10-02 ENCOUNTER — Ambulatory Visit (INDEPENDENT_AMBULATORY_CARE_PROVIDER_SITE_OTHER): Payer: BC Managed Care – PPO | Admitting: Pharmacist

## 2016-10-02 DIAGNOSIS — E119 Type 2 diabetes mellitus without complications: Secondary | ICD-10-CM

## 2016-10-02 DIAGNOSIS — Z794 Long term (current) use of insulin: Secondary | ICD-10-CM | POA: Diagnosis not present

## 2016-10-02 NOTE — Progress Notes (Signed)
Patient ID: Ellen Hunt, female   DOB: 11/14/1950, 65 y.o.   MRN: [id number]   Chief Complaint:   Chief Complaint  Patient presents with  . Diabetes    HPI:  Patient last seen 05/2016 for uncontrolled DM and insulin pump adjustment. Since that visit patient began to have abdominal pain and swelling.  After being referred for CT of abdomen and ER visit.  Patient has been diagnosed with peritoneal / ovarian/ fallopian tube carcinoma.  She is has finished about 4 rounds of chemo and debulking surgery if planned for 10/14/2016.  She is here today to discuss plans for insulin pump during hospitalization and post surgery.    She is currently using a Medtronic insulin pump Paradigm 722.  Uses Novolog insulin in pump.  Also takes metformin 1000mg  bid with food.  Current Insulin Pump Settings:        Basal:  MN to 2am = 1.85                    2am to 8am = 2.20                    8am to 5pm = 2.25                    5pm to MN = 1.90       Insulin to CHO ratio = 8       Insulin Sensitivity = 13  Home BG Monitoring:  Checking 4 to  times a day. 14 day avg = 202 +/- 95  Average total daily insulin = 85.4 +/- 21.2 units Average basal amt = 50.4 Average bolus per day = 35.1  Low fat/carbohydrate diet?  Yes most days Nicotine Abuse?  No Medication Compliance?  Yes Exercise?  Not currently Alcohol use?  No   Lab Results  Component Value Date   HGBA1C 6.8 05/12/2016   HGBA1C 8.4 02/06/2016   HGBA1C 6.8 10/03/2015   HGBA1C 7.5 03/29/2015   HGBA1C 8.1 12/08/2014     Assessment: 1.  Type 2 Diabetes with long term insulin therapy   Recommendations: 1.  Medication recommendations at this time are as follows:   Insulin Pump Settings - no change 2.  Reviewed HBG goals:  Fasting 80-130 and 1-2 hour post prandial <180.  Patient is instructed to check BG 4 to 5 times per day.    3.  Plan for using insulin pens was given to patient in case she finds that she is unable to use insulin pump for a  few days after surgery.  She is given #1 pen of Basaglar with instructions to inject 55 units qd She is also given #1 pen of Novolog with instrutions to inject 10 units tid.  4. Also reviewed and gave written instructions on how to use temp basal if needed the day prior to surgery since she has been instructed to eat broth and other "light foods" 5.  Also reviewed how to set temp basal if needed   Time spent counseling patient:  30 minutes   Cherre Robins, PharmD, CPP, CDE

## 2016-10-02 NOTE — Patient Instructions (Signed)
Insulin Plan in case you are not able to restart insulin pump after surgery:  Basaglar (long acting insulin) - inject 55 units once a day  Novolog (short acting insulin) - inject 10 units prior to each meal.

## 2016-10-02 NOTE — Telephone Encounter (Signed)
Patient had left message stating she was concerned because her surgery was not showing up under her appts on Mychart.  Advised patient that her surgery would not show up under appts but she is scheduled.  Preop appt is scheduled as well.  Advised to call for any needs or concerns.

## 2016-10-03 ENCOUNTER — Encounter (HOSPITAL_COMMUNITY): Payer: Self-pay

## 2016-10-03 ENCOUNTER — Encounter (HOSPITAL_COMMUNITY): Payer: BC Managed Care – PPO | Attending: Oncology

## 2016-10-03 DIAGNOSIS — T451X5A Adverse effect of antineoplastic and immunosuppressive drugs, initial encounter: Secondary | ICD-10-CM

## 2016-10-03 DIAGNOSIS — C569 Malignant neoplasm of unspecified ovary: Secondary | ICD-10-CM | POA: Diagnosis not present

## 2016-10-03 DIAGNOSIS — R935 Abnormal findings on diagnostic imaging of other abdominal regions, including retroperitoneum: Secondary | ICD-10-CM | POA: Diagnosis present

## 2016-10-03 DIAGNOSIS — D6481 Anemia due to antineoplastic chemotherapy: Secondary | ICD-10-CM

## 2016-10-03 MED ORDER — SODIUM CHLORIDE 0.9 % IV SOLN
250.0000 mL | Freq: Once | INTRAVENOUS | Status: AC
  Administered 2016-10-03: 250 mL via INTRAVENOUS

## 2016-10-03 MED ORDER — ACETAMINOPHEN 325 MG PO TABS
ORAL_TABLET | ORAL | Status: AC
  Filled 2016-10-03: qty 2

## 2016-10-03 MED ORDER — HEPARIN SOD (PORK) LOCK FLUSH 100 UNIT/ML IV SOLN
500.0000 [IU] | Freq: Every day | INTRAVENOUS | Status: AC | PRN
  Administered 2016-10-03: 500 [IU]

## 2016-10-03 MED ORDER — DIPHENHYDRAMINE HCL 50 MG/ML IJ SOLN
INTRAMUSCULAR | Status: AC
  Filled 2016-10-03: qty 1

## 2016-10-03 MED ORDER — ACETAMINOPHEN 325 MG PO TABS
650.0000 mg | ORAL_TABLET | Freq: Once | ORAL | Status: AC
  Administered 2016-10-03: 650 mg via ORAL

## 2016-10-03 MED ORDER — SODIUM CHLORIDE 0.9% FLUSH
10.0000 mL | INTRAVENOUS | Status: AC | PRN
  Administered 2016-10-03: 10 mL

## 2016-10-03 MED ORDER — DIPHENHYDRAMINE HCL 50 MG/ML IJ SOLN
25.0000 mg | Freq: Once | INTRAMUSCULAR | Status: AC
  Administered 2016-10-03: 25 mg via INTRAVENOUS

## 2016-10-03 NOTE — Progress Notes (Signed)
Patient tolerated transfusion well.  VSS throughout.  Patient ambulatory and stable upon discharge from clinic.

## 2016-10-03 NOTE — Patient Instructions (Signed)
[facility] at [facility] Discharge Instructions  RECOMMENDATIONS MADE BY THE CONSULTANT AND ANY TEST RESULTS WILL BE SENT TO YOUR REFERRING PHYSICIAN.  One unit of blood today.    Thank you for choosing [facility] at [facility] to provide your oncology and hematology care.  To afford each patient quality time with our provider, please arrive at least 15 minutes before your scheduled appointment time.   Beginning January 23rd 2017 lab work for the Cancer Center will be done in the  Main lab at [facility] on 1st floor. If you have a lab appointment with the Cancer Center please come in thru the  Main Entrance and check in at the main information desk  You need to re-schedule your appointment should you arrive 10 or more minutes late.  We strive to give you quality time with our providers, and arriving late affects you and other patients whose appointments are after yours.  Also, if you no show three or more times for appointments you may be dismissed from the clinic at the providers discretion.     Again, thank you for choosing [facility].  Our hope is that these requests will decrease the amount of time that you wait before being seen by our physicians.       _____________________________________________________________  Should you have questions after your visit to [facility], please contact our office at [phone] between the hours of 8:30 a.m. and 4:30 p.m.  Voicemails left after 4:30 p.m. will not be returned until the following business day.  For prescription refill requests, have your pharmacy contact our office.         Resources For Cancer Patients and their Caregivers ? American Cancer Society: Can assist with transportation, wigs, general needs, runs Look Good Feel Better.        1-888-227-6333 ? Cancer Care: Provides financial assistance, online support groups, medication/co-pay assistance.   1-800-813-HOPE (4673) ? [facility] Assists [place] cancer patients and their families through emotional , educational and financial support.  336-427-4357 ? [place] DSS Where to apply for food stamps, Medicaid and utility assistance. 336-342-1394 ? RCATS: Transportation to medical appointments. 336-347-2287 ? Social Security Administration: May apply for disability if have a Stage IV cancer. 336-342-7796 1-800-772-1213 ? [place] Aging, Disability and Transit Services: Assists with nutrition, care and transit needs. 336-349-2343  Cancer Center Support Programs: @10RELATIVEDAYS@ > Cancer Support Group  2nd Tuesday of the month 1pm-2pm, Journey Room  > Creative Journey  3rd Tuesday of the month 1130am-1pm, Journey Room  > Look Good Feel Better  1st Wednesday of the month 10am-12 noon, Journey Room (Call American Cancer Society to register 1-800-395-5775)    

## 2016-10-04 LAB — TYPE AND SCREEN
ABO/RH(D): O POS
Antibody Screen: NEGATIVE
Unit division: 0

## 2016-10-06 ENCOUNTER — Other Ambulatory Visit (HOSPITAL_COMMUNITY): Payer: Self-pay | Admitting: Hematology & Oncology

## 2016-10-08 ENCOUNTER — Encounter (HOSPITAL_COMMUNITY): Payer: BC Managed Care – PPO

## 2016-10-08 DIAGNOSIS — R935 Abnormal findings on diagnostic imaging of other abdominal regions, including retroperitoneum: Secondary | ICD-10-CM | POA: Diagnosis not present

## 2016-10-08 DIAGNOSIS — C569 Malignant neoplasm of unspecified ovary: Secondary | ICD-10-CM

## 2016-10-08 LAB — CBC WITH DIFFERENTIAL/PLATELET
Basophils Absolute: 0 10*3/uL (ref 0.0–0.1)
Basophils Relative: 0 %
Eosinophils Absolute: 0.1 10*3/uL (ref 0.0–0.7)
Eosinophils Relative: 1 %
HCT: 30.6 % — ABNORMAL LOW (ref 36.0–46.0)
Hemoglobin: 10.1 g/dL — ABNORMAL LOW (ref 12.0–15.0)
Lymphocytes Relative: 9 %
Lymphs Abs: 0.5 10*3/uL — ABNORMAL LOW (ref 0.7–4.0)
MCH: 33.3 pg (ref 26.0–34.0)
MCHC: 33 g/dL (ref 30.0–36.0)
MCV: 101 fL — ABNORMAL HIGH (ref 78.0–100.0)
Monocytes Absolute: 0.7 10*3/uL (ref 0.1–1.0)
Monocytes Relative: 13 %
Neutro Abs: 4.6 10*3/uL (ref 1.7–7.7)
Neutrophils Relative %: 78 %
Platelets: 116 10*3/uL — ABNORMAL LOW (ref 150–400)
RBC: 3.03 MIL/uL — ABNORMAL LOW (ref 3.87–5.11)
RDW: 20.7 % — ABNORMAL HIGH (ref 11.5–15.5)
WBC: 5.9 10*3/uL (ref 4.0–10.5)

## 2016-10-08 LAB — SAMPLE TO BLOOD BANK

## 2016-10-09 ENCOUNTER — Encounter (HOSPITAL_COMMUNITY)
Admission: RE | Admit: 2016-10-09 | Discharge: 2016-10-09 | Disposition: A | Discharge status: Home / Self Care | Payer: BC Managed Care – PPO | Source: Ambulatory Visit | Attending: Gynecologic Oncology | Admitting: Gynecologic Oncology

## 2016-10-09 ENCOUNTER — Encounter (HOSPITAL_COMMUNITY): Payer: Self-pay

## 2016-10-09 DIAGNOSIS — D696 Thrombocytopenia, unspecified: Secondary | ICD-10-CM | POA: Diagnosis not present

## 2016-10-09 DIAGNOSIS — E119 Type 2 diabetes mellitus without complications: Secondary | ICD-10-CM | POA: Insufficient documentation

## 2016-10-09 DIAGNOSIS — I10 Essential (primary) hypertension: Secondary | ICD-10-CM | POA: Insufficient documentation

## 2016-10-09 DIAGNOSIS — Z6837 Body mass index (BMI) 37.0-37.9, adult: Secondary | ICD-10-CM | POA: Diagnosis not present

## 2016-10-09 DIAGNOSIS — Z01810 Encounter for preprocedural cardiovascular examination: Secondary | ICD-10-CM | POA: Insufficient documentation

## 2016-10-09 DIAGNOSIS — D630 Anemia in neoplastic disease: Secondary | ICD-10-CM | POA: Diagnosis not present

## 2016-10-09 DIAGNOSIS — C569 Malignant neoplasm of unspecified ovary: Secondary | ICD-10-CM | POA: Diagnosis present

## 2016-10-09 DIAGNOSIS — Z801 Family history of malignant neoplasm of trachea, bronchus and lung: Secondary | ICD-10-CM | POA: Diagnosis not present

## 2016-10-09 DIAGNOSIS — C481 Malignant neoplasm of specified parts of peritoneum: Secondary | ICD-10-CM | POA: Diagnosis not present

## 2016-10-09 DIAGNOSIS — Z01812 Encounter for preprocedural laboratory examination: Secondary | ICD-10-CM | POA: Diagnosis present

## 2016-10-09 DIAGNOSIS — Z806 Family history of leukemia: Secondary | ICD-10-CM | POA: Diagnosis not present

## 2016-10-09 DIAGNOSIS — Z833 Family history of diabetes mellitus: Secondary | ICD-10-CM | POA: Diagnosis not present

## 2016-10-09 DIAGNOSIS — C44599 Other specified malignant neoplasm of skin of other part of trunk: Secondary | ICD-10-CM | POA: Diagnosis not present

## 2016-10-09 DIAGNOSIS — C562 Malignant neoplasm of left ovary: Secondary | ICD-10-CM | POA: Diagnosis not present

## 2016-10-09 DIAGNOSIS — D6481 Anemia due to antineoplastic chemotherapy: Secondary | ICD-10-CM | POA: Diagnosis not present

## 2016-10-09 DIAGNOSIS — Z9221 Personal history of antineoplastic chemotherapy: Secondary | ICD-10-CM | POA: Diagnosis not present

## 2016-10-09 DIAGNOSIS — Z803 Family history of malignant neoplasm of breast: Secondary | ICD-10-CM | POA: Diagnosis not present

## 2016-10-09 DIAGNOSIS — Z794 Long term (current) use of insulin: Secondary | ICD-10-CM | POA: Diagnosis not present

## 2016-10-09 DIAGNOSIS — C561 Malignant neoplasm of right ovary: Secondary | ICD-10-CM | POA: Diagnosis not present

## 2016-10-09 HISTORY — DX: Personal history of other diseases of the respiratory system: Z87.09

## 2016-10-09 HISTORY — DX: Gastro-esophageal reflux disease without esophagitis: K21.9

## 2016-10-09 HISTORY — DX: Zoster without complications: B02.9

## 2016-10-09 HISTORY — DX: Personal history of other medical treatment: Z92.89

## 2016-10-09 HISTORY — DX: Personal history of urinary (tract) infections: Z87.440

## 2016-10-09 HISTORY — DX: Cardiac arrhythmia, unspecified: I49.9

## 2016-10-09 HISTORY — DX: Personal history of antineoplastic chemotherapy: Z92.21

## 2016-10-09 LAB — COMPREHENSIVE METABOLIC PANEL
ALBUMIN: 4.2 g/dL (ref 3.5–5.0)
ALT: 16 U/L (ref 14–54)
ANION GAP: 10 (ref 5–15)
AST: 21 U/L (ref 15–41)
Alkaline Phosphatase: 92 U/L (ref 38–126)
BILIRUBIN TOTAL: 0.4 mg/dL (ref 0.3–1.2)
BUN: 17 mg/dL (ref 6–20)
CHLORIDE: 104 mmol/L (ref 101–111)
CO2: 25 mmol/L (ref 22–32)
Calcium: 9.4 mg/dL (ref 8.9–10.3)
Creatinine, Ser: 0.77 mg/dL (ref 0.44–1.00)
GFR calc Af Amer: >60 mL/min (ref >60)
GLUCOSE: 115 mg/dL — AB (ref 65–99)
POTASSIUM: 4.3 mmol/L (ref 3.5–5.1)
Sodium: 139 mmol/L (ref 135–145)
TOTAL PROTEIN: 6.9 g/dL (ref 6.5–8.1)

## 2016-10-09 LAB — URINALYSIS, ROUTINE W REFLEX MICROSCOPIC
BILIRUBIN URINE: NEGATIVE
Glucose, UA: NEGATIVE mg/dL
HGB URINE DIPSTICK: NEGATIVE
KETONES UR: NEGATIVE mg/dL
LEUKOCYTES UA: NEGATIVE
Nitrite: POSITIVE — AB
PH: 5 (ref 5.0–8.0)
Protein, ur: NEGATIVE mg/dL
SPECIFIC GRAVITY, URINE: 1.009 (ref 1.005–1.030)

## 2016-10-09 LAB — CBC WITH DIFFERENTIAL/PLATELET
BASOS ABS: 0 10*3/uL (ref 0.0–0.1)
BASOS PCT: 0 %
EOS PCT: 1 %
Eosinophils Absolute: 0.1 10*3/uL (ref 0.0–0.7)
HCT: 30.2 % — ABNORMAL LOW (ref 36.0–46.0)
Hemoglobin: 10 g/dL — ABNORMAL LOW (ref 12.0–15.0)
Lymphocytes Relative: 7 %
Lymphs Abs: 0.5 10*3/uL — ABNORMAL LOW (ref 0.7–4.0)
MCH: 32.9 pg (ref 26.0–34.0)
MCHC: 33.1 g/dL (ref 30.0–36.0)
MCV: 99.3 fL (ref 78.0–100.0)
MONO ABS: 0.8 10*3/uL (ref 0.1–1.0)
MONOS PCT: 13 %
Neutro Abs: 5 10*3/uL (ref 1.7–7.7)
Neutrophils Relative %: 79 %
PLATELETS: 135 10*3/uL — AB (ref 150–400)
RBC: 3.04 MIL/uL — ABNORMAL LOW (ref 3.87–5.11)
RDW: 20.2 % — AB (ref 11.5–15.5)
WBC: 6.3 10*3/uL (ref 4.0–10.5)

## 2016-10-09 LAB — GLUCOSE, CAPILLARY: GLUCOSE-CAPILLARY: 129 mg/dL — AB (ref 65–99)

## 2016-10-09 NOTE — Patient Instructions (Signed)
Gracilyn Laforce  10/09/2016   Your procedure is scheduled on: Tuesday October 14, 2016  Report to [facility] Main  Entrance take [place]  elevators to 3rd floor to  [place] at 5:00 AM.  Call this number if you have problems the morning of surgery [phone]   Remember: ONLY 1 PERSON MAY GO WITH YOU TO SHORT STAY TO GET  READY MORNING OF [place].  Do not eat food or drink liquids :After Midnight.     Take these medicines the morning of surgery with A SIP OF WATER: Diazepam (Valium) if needed; May take hydrocodone-acetaminophen if needed; Omeprazole; Valacyclovir (Valtrex)  How to Manage Your Diabetes Before and After Surgery  Why is it important to control my blood sugar before and after surgery? . Improving blood sugar levels before and after surgery helps healing and can limit problems. . A way of improving blood sugar control is eating a healthy diet by: o  Eating less sugar and carbohydrates o  Increasing activity/exercise o  Talking with your doctor about reaching your blood sugar goals . High blood sugars (greater than 180 mg/dL) can raise your risk of infections and slow your recovery, so you will need to focus on controlling your diabetes during the weeks before surgery. . Make sure that the doctor who takes care of your diabetes knows about your planned surgery including the date and location.  How do I manage my blood sugar before surgery? . Check your blood sugar at least 4 times a day, starting 2 days before surgery, to make sure that the level is not too high or low. o Check your blood sugar the morning of your surgery when you wake up and every 2 hours until you get to the Short Stay unit. . If your blood sugar is less than 70 mg/dL, you will need to treat for low blood sugar: o Do not take insulin. o Treat a low blood sugar (less than 70 mg/dL) with  cup of clear juice (cranberry or apple), 4 glucose tablets, OR glucose  gel. o Recheck blood sugar in 15 minutes after treatment (to make sure it is greater than 70 mg/dL). If your blood sugar is not greater than 70 mg/dL on recheck, call [phone] for further instructions. . Report your blood sugar to the short stay nurse when you get to Short Stay.  . If you are admitted to the hospital after surgery: o Your blood sugar will be checked by the staff and you will probably be given insulin after surgery (instead of oral diabetes medicines) to make sure you have good blood sugar levels. o The goal for blood sugar control after surgery is 80-180 mg/dL.   WHAT DO I DO ABOUT MY DIABETES MEDICATION?  Marland Kitchen Do not take oral diabetes medicines (pills) the morning of surgery.  . THE NIGHT BEFORE SURGERY, take ___________ units of ___________insulin.       . THE MORNING OF SURGERY, take _____________ units of __________insulin.  . The day of surgery, do not take other diabetes injectables, including Byetta (exenatide), Bydureon (exenatide ER), Victoza (liraglutide), or Trulicity (dulaglutide).  . If your CBG is greater than 220 mg/dL, you may take  of your sliding scale  . (correction) dose of insulin.    For patients with insulin pumps: Contact your diabetes doctor for specific instructions before surgery. Decrease basal rates by 20% at midnight the night  before your surgery. Note that if your surgery is planned to be longer than 2 hours, your insulin pump will be removed and intravenous (IV) insulin will be started and managed by the nurses and the anesthesiologist. You will be able to restart your insulin pump once you are awake and able to manage it.  Make sure to bring insulin pump supplies to the hospital with you in case the  site needs to be changed.  Patient Signature:  Date:   Nurse Signature:  Date:   Reviewed and Endorsed by [facility] Patient Education Committee, August 2015                                You may not have any metal on your body  including hair pins and              piercings  Do not wear jewelry, make-up, lotions, powders or perfumes, deodorant             Do not wear nail polish.  Do not shave  48 hours prior to surgery.     Do not bring valuables to the hospital. [place].  Contacts, dentures or bridgework may not be worn into surgery.  Leave suitcase in the car. After surgery it may be brought to your room.    Special Instructions: FOLLOW SURGEON'S INSTRUCTIONS IN REGARDS TO BOWEL PREPARATION PRIOR TO SURGICAL PROCEDURE DATE               Please read over the following fact sheets you were given:INCENTIVE SPIROMETER; BLOOD TRANSFUSION INFORMATION SHEET  _____________________________________________________________________             [facility] - Preparing for Surgery Before surgery, you can play an important role.  Because skin is not sterile, your skin needs to be as free of germs as possible.  You can reduce the number of germs on your skin by washing with CHG (chlorahexidine gluconate) soap before surgery.  CHG is an antiseptic cleaner which kills germs and bonds with the skin to continue killing germs even after washing. Please DO NOT use if you have an allergy to CHG or antibacterial soaps.  If your skin becomes reddened/irritated stop using the CHG and inform your nurse when you arrive at Short Stay. Do not shave (including legs and underarms) for at least 48 hours prior to the first CHG shower.  You may shave your face/neck. Please follow these instructions carefully:  1.  Shower with CHG Soap the night before surgery and the  morning of Surgery.  2.  If you choose to wash your hair, wash your hair first as usual with your  normal  shampoo.  3.  After you shampoo, rinse your hair and body thoroughly to remove the  shampoo.                           4.  Use CHG as you would any other liquid soap.  You can apply chg directly  to the skin and wash                        Gently with a scrungie or clean washcloth.  5.  Apply the CHG Soap to your body ONLY FROM THE  NECK DOWN.   Do not use on face/ open                           Wound or open sores. Avoid contact with eyes, ears mouth and genitals (private parts).                       Wash face,  Genitals (private parts) with your normal soap.             6.  Wash thoroughly, paying special attention to the area where your surgery  will be performed.  7.  Thoroughly rinse your body with warm water from the neck down.  8.  DO NOT shower/wash with your normal soap after using and rinsing off  the CHG Soap.                9.  Pat yourself dry with a clean towel.            10.  Wear clean pajamas.            11.  Place clean sheets on your bed the night of your first shower and do not  sleep with pets. Day of Surgery : Do not apply any lotions/deodorants the morning of surgery.  Please wear clean clothes to the hospital/surgery center.  FAILURE TO FOLLOW THESE INSTRUCTIONS MAY RESULT IN THE CANCELLATION OF YOUR SURGERY PATIENT SIGNATURE_________________________________  NURSE SIGNATURE__________________________________  ________________________________________________________________________   Adam Phenix  An incentive spirometer is a tool that can help keep your lungs clear and active. This tool measures how well you are filling your lungs with each breath. Taking long deep breaths may help reverse or decrease the chance of developing breathing (pulmonary) problems (especially infection) following:  A long period of time when you are unable to move or be active. BEFORE THE PROCEDURE   If the spirometer includes an indicator to show your best effort, your nurse or respiratory therapist will set it to a desired goal.  If possible, sit up straight or lean slightly forward. Try not to slouch.  Hold the incentive spirometer in an upright position. INSTRUCTIONS FOR USE  1. Sit on the edge  of your bed if possible, or sit up as far as you can in bed or on a chair. 2. Hold the incentive spirometer in an upright position. 3. Breathe out normally. 4. Place the mouthpiece in your mouth and seal your lips tightly around it. 5. Breathe in slowly and as deeply as possible, raising the piston or the ball toward the top of the column. 6. Hold your breath for 3-5 seconds or for as long as possible. Allow the piston or ball to fall to the bottom of the column. 7. Remove the mouthpiece from your mouth and breathe out normally. 8. Rest for a few seconds and repeat Steps 1 through 7 at least 10 times every 1-2 hours when you are awake. Take your time and take a few normal breaths between deep breaths. 9. The spirometer may include an indicator to show your best effort. Use the indicator as a goal to work toward during each repetition. 10. After each set of 10 deep breaths, practice coughing to be sure your lungs are clear. If you have an incision (the cut made at the time of surgery), support your incision when coughing by placing a pillow or rolled up towels firmly against it. Once you are able  to get out of bed, walk around indoors and cough well. You may stop using the incentive spirometer when instructed by your caregiver.  RISKS AND COMPLICATIONS  Take your time so you do not get dizzy or light-headed.  If you are in pain, you may need to take or ask for pain medication before doing incentive spirometry. It is harder to take a deep breath if you are having pain. AFTER USE  Rest and breathe slowly and easily.  It can be helpful to keep track of a log of your progress. Your caregiver can provide you with a simple table to help with this. If you are using the spirometer at home, follow these instructions: [place] IF:   You are having difficultly using the spirometer.  You have trouble using the spirometer as often as instructed.  Your pain medication is not giving enough  relief while using the spirometer.  You develop fever of 100.5 F (38.1 C) or higher. SEEK IMMEDIATE MEDICAL CARE IF:   You cough up bloody sputum that had not been present before.  You develop fever of 102 F (38.9 C) or greater.  You develop worsening pain at or near the incision site. MAKE SURE YOU:   Understand these instructions.  Will watch your condition.  Will get help right away if you are not doing well or get worse. Document Released: 03/02/2007 Document Revised: 01/12/2012 Document Reviewed: 05/03/2007 ExitCare Patient Information 2014 ExitCare, [place].   ________________________________________________________________________  WHAT IS A BLOOD TRANSFUSION? Blood Transfusion Information  A transfusion is the replacement of blood or some of its parts. Blood is made up of multiple cells which provide different functions.  Red blood cells carry oxygen and are used for blood loss replacement.  White blood cells fight against infection.  Platelets control bleeding.  Plasma helps clot blood.  Other blood products are available for specialized needs, such as hemophilia or other clotting disorders. BEFORE THE TRANSFUSION  Who gives blood for transfusions?   Healthy volunteers who are fully evaluated to make sure their blood is safe. This is blood bank blood. Transfusion therapy is the safest it has ever been in the practice of medicine. Before blood is taken from a donor, a complete history is taken to make sure that person has no history of diseases nor engages in risky social behavior (examples are intravenous drug use or sexual activity with multiple partners). The donor's travel history is screened to minimize risk of transmitting infections, such as malaria. The donated blood is tested for signs of infectious diseases, such as HIV and hepatitis. The blood is then tested to be sure it is compatible with you in order to minimize the chance of a transfusion reaction. If  you or a relative donates blood, this is often done in anticipation of surgery and is not appropriate for emergency situations. It takes many days to process the donated blood. RISKS AND COMPLICATIONS Although transfusion therapy is very safe and saves many lives, the main dangers of transfusion include:   Getting an infectious disease.  Developing a transfusion reaction. This is an allergic reaction to something in the blood you were given. Every precaution is taken to prevent this. The decision to have a blood transfusion has been considered carefully by your caregiver before blood is given. Blood is not given unless the benefits outweigh the risks. AFTER THE TRANSFUSION  Right after receiving a blood transfusion, you will usually feel much better and more energetic. This is especially true  if your red blood cells have gotten low (anemic). The transfusion raises the level of the red blood cells which carry oxygen, and this usually causes an energy increase.  The nurse administering the transfusion will monitor you carefully for complications. HOME CARE INSTRUCTIONS  No special instructions are needed after a transfusion. You may find your energy is better. Speak with your caregiver about any limitations on activity for underlying diseases you may have. SEEK MEDICAL CARE IF:   Your condition is not improving after your transfusion.  You develop redness or irritation at the intravenous (IV) site. SEEK IMMEDIATE MEDICAL CARE IF:  Any of the following symptoms occur over the next 12 hours:  Shaking chills.  You have a temperature by mouth above 102 F (38.9 C), not controlled by medicine.  Chest, back, or muscle pain.  People around you feel you are not acting correctly or are confused.  Shortness of breath or difficulty breathing.  Dizziness and fainting.  You get a rash or develop hives.  You have a decrease in urine output.  Your urine turns a dark color or changes to pink,  red, or brown. Any of the following symptoms occur over the next 10 days:  You have a temperature by mouth above 102 F (38.9 C), not controlled by medicine.  Shortness of breath.  Weakness after normal activity.  The white part of the eye turns yellow (jaundice).  You have a decrease in the amount of urine or are urinating less often.  Your urine turns a dark color or changes to pink, red, or brown. Document Released: 10/17/2000 Document Revised: 01/12/2012 Document Reviewed: 06/05/2008 [facility] Patient Information 2014 [place], [place].  _______________________________________________________________________

## 2016-10-10 ENCOUNTER — Encounter (HOSPITAL_COMMUNITY): Payer: Self-pay

## 2016-10-10 ENCOUNTER — Other Ambulatory Visit (HOSPITAL_COMMUNITY): Payer: Self-pay | Admitting: Hematology & Oncology

## 2016-10-10 LAB — HEMOGLOBIN A1C
Hgb A1c MFr Bld: 6.2 % — ABNORMAL HIGH (ref 4.8–5.6)
Mean Plasma Glucose: 131 mg/dL

## 2016-10-12 LAB — URINE CULTURE

## 2016-10-13 ENCOUNTER — Telehealth: Payer: Self-pay | Admitting: Family Medicine

## 2016-10-13 ENCOUNTER — Encounter (HOSPITAL_COMMUNITY): Payer: Self-pay | Admitting: Anesthesiology

## 2016-10-13 ENCOUNTER — Other Ambulatory Visit: Payer: Self-pay | Admitting: Gynecologic Oncology

## 2016-10-13 DIAGNOSIS — N390 Urinary tract infection, site not specified: Secondary | ICD-10-CM

## 2016-10-13 MED ORDER — SULFAMETHOXAZOLE-TRIMETHOPRIM 800-160 MG PO TABS: 1.0000 | ORAL_TABLET | Freq: Two times a day (BID) | ORAL | 0 refills | Status: DC

## 2016-10-13 NOTE — Progress Notes (Signed)
Urine culture results in epic sent to Dr Denman George and Joylene John NP

## 2016-10-13 NOTE — Anesthesia Preprocedure Evaluation (Addendum)
Anesthesia Evaluation  Patient identified by MRN, date of birth, ID band Patient awake    Reviewed: Allergy & Precautions, NPO status , Patient's Chart, lab work & pertinent test results  Airway Mallampati: IV  TM Distance: >3 FB Neck ROM: Full   Comment: Prominent teeth Dental  (+) Teeth Intact, Dental Advisory Given   Pulmonary neg pulmonary ROS,    breath sounds clear to auscultation       Cardiovascular hypertension, Pt. on medications + dysrhythmias  Rhythm:Regular Rate:Normal     Neuro/Psych  Neuromuscular disease negative psych ROS   GI/Hepatic Neg liver ROS, GERD  Medicated,  Endo/Other  diabetes, Type 2, Oral Hypoglycemic Agents, Insulin Dependent  Renal/GU negative Renal ROS  negative genitourinary   Musculoskeletal negative musculoskeletal ROS (+)   Abdominal   Peds negative pediatric ROS (+)  Hematology   Anesthesia Other Findings   Reproductive/Obstetrics negative OB ROS                            Lab Results  Component Value Date   WBC 6.3 10/09/2016   HGB 10.0 (L) 10/09/2016   HCT 30.2 (L) 10/09/2016   MCV 99.3 10/09/2016   PLT 135 (L) 10/09/2016   Lab Results  Component Value Date   CREATININE 0.77 10/09/2016   BUN 17 10/09/2016   NA 139 10/09/2016   K 4.3 10/09/2016   CL 104 10/09/2016   CO2 25 10/09/2016   Lab Results  Component Value Date   INR 1.08 05/13/2016   EKG: normal sinus rhythm.  Anesthesia Physical Anesthesia Plan  ASA: II  Anesthesia Plan: General   Post-op Pain Management:    Induction: Intravenous  Airway Management Planned: Oral ETT  Additional Equipment:   Intra-op Plan:   Post-operative Plan: Extubation in OR  Informed Consent: I have reviewed the patients History and Physical, chart, labs and discussed the procedure including the risks, benefits and alternatives for the proposed anesthesia with the patient or authorized  representative who has indicated his/her understanding and acceptance.   Dental advisory given  Plan Discussed with: CRNA  Anesthesia Plan Comments: (Glidescope available.   Insulin pump removed by surgeon prior to procedure. Will monitor blood glucose hourly in OR. )       Anesthesia Quick Evaluation

## 2016-10-13 NOTE — Telephone Encounter (Signed)
Patient called to ask if Magnesium citrate contained sugar because her BG was 200.  There is no sugar in the mag citrate that she has.  I advised that she give correction and continue to check BG every 1-2 hours. If BG doesn't decrease the she is to call me back.  Reviewed how to set temp basal rate.

## 2016-10-14 ENCOUNTER — Ambulatory Visit (HOSPITAL_COMMUNITY)
Admission: RE | Admit: 2016-10-14 | Discharge: 2016-10-15 | Disposition: A | Discharge status: Home / Self Care | Payer: BC Managed Care – PPO | Source: Ambulatory Visit | Attending: Gynecologic Oncology | Admitting: Gynecologic Oncology

## 2016-10-14 ENCOUNTER — Inpatient Hospital Stay (HOSPITAL_COMMUNITY): Payer: BC Managed Care – PPO | Admitting: Anesthesiology

## 2016-10-14 ENCOUNTER — Encounter (HOSPITAL_COMMUNITY): Payer: Self-pay | Admitting: *Deleted

## 2016-10-14 ENCOUNTER — Encounter (HOSPITAL_COMMUNITY)
Admission: RE | Disposition: A | Discharge status: Home / Self Care | Payer: Self-pay | Source: Ambulatory Visit | Attending: Gynecologic Oncology

## 2016-10-14 DIAGNOSIS — Z9221 Personal history of antineoplastic chemotherapy: Secondary | ICD-10-CM | POA: Insufficient documentation

## 2016-10-14 DIAGNOSIS — D630 Anemia in neoplastic disease: Secondary | ICD-10-CM | POA: Insufficient documentation

## 2016-10-14 DIAGNOSIS — C569 Malignant neoplasm of unspecified ovary: Secondary | ICD-10-CM | POA: Diagnosis not present

## 2016-10-14 DIAGNOSIS — Z803 Family history of malignant neoplasm of breast: Secondary | ICD-10-CM | POA: Insufficient documentation

## 2016-10-14 DIAGNOSIS — E119 Type 2 diabetes mellitus without complications: Secondary | ICD-10-CM | POA: Insufficient documentation

## 2016-10-14 DIAGNOSIS — D6481 Anemia due to antineoplastic chemotherapy: Secondary | ICD-10-CM | POA: Diagnosis present

## 2016-10-14 DIAGNOSIS — Z833 Family history of diabetes mellitus: Secondary | ICD-10-CM | POA: Insufficient documentation

## 2016-10-14 DIAGNOSIS — Z9641 Presence of insulin pump (external) (internal): Secondary | ICD-10-CM

## 2016-10-14 DIAGNOSIS — Z794 Long term (current) use of insulin: Secondary | ICD-10-CM | POA: Insufficient documentation

## 2016-10-14 DIAGNOSIS — T451X5A Adverse effect of antineoplastic and immunosuppressive drugs, initial encounter: Secondary | ICD-10-CM

## 2016-10-14 DIAGNOSIS — Z801 Family history of malignant neoplasm of trachea, bronchus and lung: Secondary | ICD-10-CM | POA: Insufficient documentation

## 2016-10-14 DIAGNOSIS — E118 Type 2 diabetes mellitus with unspecified complications: Secondary | ICD-10-CM

## 2016-10-14 DIAGNOSIS — N390 Urinary tract infection, site not specified: Secondary | ICD-10-CM | POA: Diagnosis present

## 2016-10-14 DIAGNOSIS — C562 Malignant neoplasm of left ovary: Principal | ICD-10-CM | POA: Insufficient documentation

## 2016-10-14 DIAGNOSIS — D696 Thrombocytopenia, unspecified: Secondary | ICD-10-CM | POA: Insufficient documentation

## 2016-10-14 DIAGNOSIS — C561 Malignant neoplasm of right ovary: Secondary | ICD-10-CM | POA: Insufficient documentation

## 2016-10-14 DIAGNOSIS — Z6837 Body mass index (BMI) 37.0-37.9, adult: Secondary | ICD-10-CM | POA: Insufficient documentation

## 2016-10-14 DIAGNOSIS — C481 Malignant neoplasm of specified parts of peritoneum: Secondary | ICD-10-CM | POA: Diagnosis present

## 2016-10-14 DIAGNOSIS — C44599 Other specified malignant neoplasm of skin of other part of trunk: Secondary | ICD-10-CM | POA: Insufficient documentation

## 2016-10-14 DIAGNOSIS — Z806 Family history of leukemia: Secondary | ICD-10-CM | POA: Insufficient documentation

## 2016-10-14 HISTORY — PX: OMENTECTOMY: SHX5985

## 2016-10-14 HISTORY — PX: DEBULKING: SHX6277

## 2016-10-14 HISTORY — PX: ROBOTIC ASSISTED TOTAL HYSTERECTOMY WITH BILATERAL SALPINGO OOPHERECTOMY: SHX6086

## 2016-10-14 HISTORY — PX: LAPAROTOMY WITH STAGING: SHX5938

## 2016-10-14 HISTORY — PX: ABDOMINAL HYSTERECTOMY: SHX81

## 2016-10-14 LAB — GLUCOSE, CAPILLARY
GLUCOSE-CAPILLARY: 252 mg/dL — AB (ref 65–99)
GLUCOSE-CAPILLARY: 241 mg/dL — AB (ref 65–99)
GLUCOSE-CAPILLARY: 273 mg/dL — AB (ref 65–99)
Glucose-Capillary: 118 mg/dL — ABNORMAL HIGH (ref 65–99)
Glucose-Capillary: 153 mg/dL — ABNORMAL HIGH (ref 65–99)
Glucose-Capillary: 182 mg/dL — ABNORMAL HIGH (ref 65–99)

## 2016-10-14 LAB — ABO/RH: ABO/RH(D): O POS

## 2016-10-14 LAB — PREPARE RBC (CROSSMATCH)

## 2016-10-14 SURGERY — HYSTERECTOMY, TOTAL, ROBOT-ASSISTED, LAPAROSCOPIC, WITH BILATERAL SALPINGO-OOPHORECTOMY
Anesthesia: General

## 2016-10-14 MED ORDER — FENTANYL CITRATE (PF) 250 MCG/5ML IJ SOLN
INTRAMUSCULAR | Status: DC | PRN
  Administered 2016-10-14: 100 ug via INTRAVENOUS
  Administered 2016-10-14: 150 ug via INTRAVENOUS
  Administered 2016-10-14: 25 ug via INTRAVENOUS
  Administered 2016-10-14: 50 ug via INTRAVENOUS
  Administered 2016-10-14 (×3): 25 ug via INTRAVENOUS
  Administered 2016-10-14 (×4): 50 ug via INTRAVENOUS

## 2016-10-14 MED ORDER — CEFOXITIN SODIUM 2 G IV SOLR
2.0000 g | INTRAVENOUS | Status: AC
  Administered 2016-10-14 (×2): 2 g via INTRAVENOUS

## 2016-10-14 MED ORDER — ENOXAPARIN (LOVENOX) PATIENT EDUCATION KIT
PACK | Freq: Once | Status: AC
  Administered 2016-10-14: 16:00:00
  Filled 2016-10-14: qty 1

## 2016-10-14 MED ORDER — CEFOXITIN SODIUM 2 G IV SOLR
INTRAVENOUS | Status: AC
  Filled 2016-10-14: qty 2

## 2016-10-14 MED ORDER — LIDOCAINE 2% (20 MG/ML) 5 ML SYRINGE
INTRAMUSCULAR | Status: DC | PRN
  Administered 2016-10-14: 60 mg via INTRAVENOUS

## 2016-10-14 MED ORDER — SODIUM CHLORIDE 0.9 % IJ SOLN
INTRAMUSCULAR | Status: DC | PRN
  Administered 2016-10-14: 20 mL

## 2016-10-14 MED ORDER — ALBUMIN HUMAN 5 % IV SOLN
INTRAVENOUS | Status: DC | PRN
  Administered 2016-10-14: 11:00:00 via INTRAVENOUS

## 2016-10-14 MED ORDER — ONDANSETRON HCL 4 MG/2ML IJ SOLN
INTRAMUSCULAR | Status: DC | PRN
  Administered 2016-10-14: 4 mg via INTRAVENOUS

## 2016-10-14 MED ORDER — PANTOPRAZOLE SODIUM 40 MG PO TBEC
80.0000 mg | DELAYED_RELEASE_TABLET | Freq: Every day | ORAL | Status: DC
Start: 2016-10-15 — End: 2016-10-15
  Administered 2016-10-15: 80 mg via ORAL
  Filled 2016-10-14: qty 2

## 2016-10-14 MED ORDER — FENTANYL CITRATE (PF) 100 MCG/2ML IJ SOLN
INTRAMUSCULAR | Status: AC
  Filled 2016-10-14: qty 2

## 2016-10-14 MED ORDER — MIDAZOLAM HCL 2 MG/2ML IJ SOLN
INTRAMUSCULAR | Status: AC
  Filled 2016-10-14: qty 2

## 2016-10-14 MED ORDER — BUPIVACAINE LIPOSOME 1.3 % IJ SUSP
INTRAMUSCULAR | Status: DC | PRN
  Administered 2016-10-14: 20 mL

## 2016-10-14 MED ORDER — SUGAMMADEX SODIUM 200 MG/2ML IV SOLN
INTRAVENOUS | Status: DC | PRN
  Administered 2016-10-14: 200 mg via INTRAVENOUS

## 2016-10-14 MED ORDER — PHENYLEPHRINE 40 MCG/ML (10ML) SYRINGE FOR IV PUSH (FOR BLOOD PRESSURE SUPPORT)
PREFILLED_SYRINGE | INTRAVENOUS | Status: AC
  Filled 2016-10-14: qty 10

## 2016-10-14 MED ORDER — ROCURONIUM BROMIDE 50 MG/5ML IV SOSY
PREFILLED_SYRINGE | INTRAVENOUS | Status: AC
  Filled 2016-10-14: qty 10

## 2016-10-14 MED ORDER — ALBUMIN HUMAN 5 % IV SOLN
INTRAVENOUS | Status: AC
  Filled 2016-10-14: qty 250

## 2016-10-14 MED ORDER — INSULIN ASPART 100 UNIT/ML ~~LOC~~ SOLN
0.0000 [IU] | Freq: Every day | SUBCUTANEOUS | Status: DC
  Administered 2016-10-14: 3 [IU] via SUBCUTANEOUS

## 2016-10-14 MED ORDER — BENAZEPRIL HCL 10 MG PO TABS
10.0000 mg | ORAL_TABLET | Freq: Every day | ORAL | Status: DC
  Administered 2016-10-15: 10 mg via ORAL
  Filled 2016-10-14: qty 1

## 2016-10-14 MED ORDER — SUGAMMADEX SODIUM 200 MG/2ML IV SOLN
INTRAVENOUS | Status: AC
  Filled 2016-10-14: qty 2

## 2016-10-14 MED ORDER — INSULIN ASPART 100 UNIT/ML ~~LOC~~ SOLN
0.0000 [IU] | Freq: Three times a day (TID) | SUBCUTANEOUS | Status: DC
  Administered 2016-10-14: 11 [IU] via SUBCUTANEOUS
  Administered 2016-10-14: 7 [IU] via SUBCUTANEOUS
  Administered 2016-10-15: 4 [IU] via SUBCUTANEOUS

## 2016-10-14 MED ORDER — INSULIN ASPART 100 UNIT/ML ~~LOC~~ SOLN
6.0000 [IU] | Freq: Three times a day (TID) | SUBCUTANEOUS | Status: DC
  Administered 2016-10-14 – 2016-10-15 (×2): 6 [IU] via SUBCUTANEOUS

## 2016-10-14 MED ORDER — LACTATED RINGERS IV SOLN
INTRAVENOUS | Status: DC | PRN
  Administered 2016-10-14: 1000 mL

## 2016-10-14 MED ORDER — TRAMADOL HCL 50 MG PO TABS
100.0000 mg | ORAL_TABLET | Freq: Four times a day (QID) | ORAL | Status: DC | PRN
  Administered 2016-10-14: 100 mg via ORAL
  Filled 2016-10-14: qty 2

## 2016-10-14 MED ORDER — KCL IN DEXTROSE-NACL 20-5-0.45 MEQ/L-%-% IV SOLN
INTRAVENOUS | Status: DC
  Administered 2016-10-14 – 2016-10-15 (×2): via INTRAVENOUS
  Filled 2016-10-14 (×3): qty 1000

## 2016-10-14 MED ORDER — SODIUM CHLORIDE 0.9 % IV SOLN: Freq: Once | INTRAVENOUS | Status: DC

## 2016-10-14 MED ORDER — FENTANYL CITRATE (PF) 250 MCG/5ML IJ SOLN
INTRAMUSCULAR | Status: AC
  Filled 2016-10-14: qty 5

## 2016-10-14 MED ORDER — PROMETHAZINE HCL 25 MG/ML IJ SOLN: 6.2500 mg | INTRAMUSCULAR | Status: DC | PRN

## 2016-10-14 MED ORDER — MIDAZOLAM HCL 2 MG/2ML IJ SOLN
INTRAMUSCULAR | Status: DC | PRN
  Administered 2016-10-14: 2 mg via INTRAVENOUS

## 2016-10-14 MED ORDER — BUPIVACAINE LIPOSOME 1.3 % IJ SUSP
INTRAMUSCULAR | Status: AC
  Filled 2016-10-14: qty 20

## 2016-10-14 MED ORDER — OXYCODONE-ACETAMINOPHEN 5-325 MG PO TABS
1.0000 | ORAL_TABLET | ORAL | Status: DC | PRN
  Administered 2016-10-14: 2 via ORAL
  Filled 2016-10-14: qty 2

## 2016-10-14 MED ORDER — HYDROMORPHONE HCL 2 MG/ML IJ SOLN
INTRAMUSCULAR | Status: AC
  Filled 2016-10-14: qty 1

## 2016-10-14 MED ORDER — DEXAMETHASONE SODIUM PHOSPHATE 10 MG/ML IJ SOLN
INTRAMUSCULAR | Status: DC | PRN
  Administered 2016-10-14: 10 mg via INTRAVENOUS

## 2016-10-14 MED ORDER — ENOXAPARIN SODIUM 40 MG/0.4ML ~~LOC~~ SOLN
40.0000 mg | SUBCUTANEOUS | Status: AC
  Administered 2016-10-14: 40 mg via SUBCUTANEOUS
  Filled 2016-10-14: qty 0.4

## 2016-10-14 MED ORDER — PHENYLEPHRINE 40 MCG/ML (10ML) SYRINGE FOR IV PUSH (FOR BLOOD PRESSURE SUPPORT)
PREFILLED_SYRINGE | INTRAVENOUS | Status: DC | PRN
  Administered 2016-10-14: 80 ug via INTRAVENOUS
  Administered 2016-10-14 (×3): 120 ug via INTRAVENOUS
  Administered 2016-10-14 (×2): 80 ug via INTRAVENOUS
  Administered 2016-10-14 (×2): 120 ug via INTRAVENOUS
  Administered 2016-10-14: 80 ug via INTRAVENOUS

## 2016-10-14 MED ORDER — ONDANSETRON HCL 4 MG/2ML IJ SOLN
INTRAMUSCULAR | Status: AC
  Filled 2016-10-14: qty 2

## 2016-10-14 MED ORDER — HYDROMORPHONE HCL 2 MG/ML IJ SOLN
0.2000 mg | INTRAMUSCULAR | Status: AC | PRN
  Administered 2016-10-14 (×2): 0.5 mg via INTRAVENOUS
  Filled 2016-10-14 (×2): qty 1

## 2016-10-14 MED ORDER — DEXTROSE 5 % IV SOLN
INTRAVENOUS | Status: AC
  Filled 2016-10-14: qty 50

## 2016-10-14 MED ORDER — ASPIRIN EC 81 MG PO TBEC
81.0000 mg | DELAYED_RELEASE_TABLET | Freq: Every day | ORAL | Status: DC
  Administered 2016-10-15: 81 mg via ORAL
  Filled 2016-10-14: qty 1

## 2016-10-14 MED ORDER — INSULIN ASPART 100 UNIT/ML ~~LOC~~ SOLN
SUBCUTANEOUS | Status: AC
  Filled 2016-10-14: qty 1

## 2016-10-14 MED ORDER — PROPOFOL 10 MG/ML IV BOLUS
INTRAVENOUS | Status: DC | PRN
  Administered 2016-10-14: 150 mg via INTRAVENOUS
  Administered 2016-10-14: 50 mg via INTRAVENOUS

## 2016-10-14 MED ORDER — BUPIVACAINE HCL (PF) 0.25 % IJ SOLN
INTRAMUSCULAR | Status: AC
  Filled 2016-10-14: qty 30

## 2016-10-14 MED ORDER — INSULIN GLARGINE 100 UNIT/ML ~~LOC~~ SOLN
40.0000 [IU] | Freq: Every day | SUBCUTANEOUS | Status: DC
  Administered 2016-10-14: 40 [IU] via SUBCUTANEOUS
  Filled 2016-10-14: qty 0.4

## 2016-10-14 MED ORDER — PHENYLEPHRINE 40 MCG/ML (10ML) SYRINGE FOR IV PUSH (FOR BLOOD PRESSURE SUPPORT)
PREFILLED_SYRINGE | INTRAVENOUS | Status: AC
  Filled 2016-10-14: qty 30

## 2016-10-14 MED ORDER — SUCCINYLCHOLINE CHLORIDE 200 MG/10ML IV SOSY
PREFILLED_SYRINGE | INTRAVENOUS | Status: DC | PRN
  Administered 2016-10-14: 100 mg via INTRAVENOUS

## 2016-10-14 MED ORDER — SUCCINYLCHOLINE CHLORIDE 200 MG/10ML IV SOSY
PREFILLED_SYRINGE | INTRAVENOUS | Status: AC
  Filled 2016-10-14: qty 10

## 2016-10-14 MED ORDER — SODIUM CHLORIDE 0.9 % IJ SOLN
INTRAMUSCULAR | Status: AC
  Filled 2016-10-14: qty 50

## 2016-10-14 MED ORDER — IBUPROFEN 800 MG PO TABS: 800.0000 mg | ORAL_TABLET | Freq: Three times a day (TID) | ORAL | Status: DC | PRN

## 2016-10-14 MED ORDER — SULFAMETHOXAZOLE-TRIMETHOPRIM 800-160 MG PO TABS
1.0000 | ORAL_TABLET | Freq: Two times a day (BID) | ORAL | Status: DC
  Administered 2016-10-14 – 2016-10-15 (×2): 1 via ORAL
  Filled 2016-10-14 (×2): qty 1

## 2016-10-14 MED ORDER — HYDROMORPHONE HCL 1 MG/ML IJ SOLN
0.2500 mg | INTRAMUSCULAR | Status: DC | PRN
  Administered 2016-10-14 (×2): 0.5 mg via INTRAVENOUS

## 2016-10-14 MED ORDER — LACTATED RINGERS IV SOLN
INTRAVENOUS | Status: DC | PRN
  Administered 2016-10-14 (×3): via INTRAVENOUS

## 2016-10-14 MED ORDER — BUPIVACAINE HCL (PF) 0.25 % IJ SOLN
INTRAMUSCULAR | Status: DC | PRN
  Administered 2016-10-14: 20 mL

## 2016-10-14 MED ORDER — DIAZEPAM 5 MG PO TABS: 2.5000 mg | ORAL_TABLET | ORAL | Status: DC | PRN

## 2016-10-14 MED ORDER — MEPERIDINE HCL 50 MG/ML IJ SOLN
6.2500 mg | INTRAMUSCULAR | Status: DC | PRN
Start: 2016-10-14 — End: 2016-10-14

## 2016-10-14 MED ORDER — ONDANSETRON HCL 4 MG/2ML IJ SOLN
4.0000 mg | Freq: Four times a day (QID) | INTRAMUSCULAR | Status: DC | PRN
  Administered 2016-10-14 (×2): 4 mg via INTRAVENOUS
  Filled 2016-10-14 (×3): qty 2

## 2016-10-14 MED ORDER — ROCURONIUM BROMIDE 10 MG/ML (PF) SYRINGE
PREFILLED_SYRINGE | INTRAVENOUS | Status: DC | PRN
  Administered 2016-10-14: 20 mg via INTRAVENOUS
  Administered 2016-10-14 (×3): 10 mg via INTRAVENOUS
  Administered 2016-10-14: 50 mg via INTRAVENOUS
  Administered 2016-10-14: 10 mg via INTRAVENOUS

## 2016-10-14 MED ORDER — ENOXAPARIN SODIUM 40 MG/0.4ML ~~LOC~~ SOLN
40.0000 mg | SUBCUTANEOUS | Status: DC
  Administered 2016-10-15: 40 mg via SUBCUTANEOUS
  Filled 2016-10-14: qty 0.4

## 2016-10-14 MED ORDER — PROPOFOL 10 MG/ML IV BOLUS
INTRAVENOUS | Status: AC
  Filled 2016-10-14: qty 20

## 2016-10-14 MED ORDER — LACTATED RINGERS IV SOLN
INTRAVENOUS | Status: DC
  Administered 2016-10-14: 1000 mL via INTRAVENOUS

## 2016-10-14 MED ORDER — LIDOCAINE 2% (20 MG/ML) 5 ML SYRINGE
INTRAMUSCULAR | Status: AC
  Filled 2016-10-14: qty 5

## 2016-10-14 MED ORDER — DEXAMETHASONE SODIUM PHOSPHATE 10 MG/ML IJ SOLN
INTRAMUSCULAR | Status: AC
  Filled 2016-10-14: qty 1

## 2016-10-14 MED ORDER — STERILE WATER FOR IRRIGATION IR SOLN
Status: DC | PRN
  Administered 2016-10-14: 1000 mL

## 2016-10-14 MED ORDER — ATORVASTATIN CALCIUM 40 MG PO TABS
40.0000 mg | ORAL_TABLET | Freq: Every day | ORAL | Status: DC
  Administered 2016-10-15: 40 mg via ORAL
  Filled 2016-10-14: qty 1

## 2016-10-14 MED ORDER — GABAPENTIN 300 MG PO CAPS
600.0000 mg | ORAL_CAPSULE | Freq: Every day | ORAL | Status: AC
  Administered 2016-10-14: 600 mg via ORAL
  Filled 2016-10-14: qty 2

## 2016-10-14 MED ORDER — ONDANSETRON HCL 4 MG PO TABS: 4.0000 mg | ORAL_TABLET | Freq: Four times a day (QID) | ORAL | Status: DC | PRN

## 2016-10-14 SURGICAL SUPPLY — 74 items
ADH SKN CLS APL DERMABOND .7 (GAUZE/BANDAGES/DRESSINGS) ×2
APPLICATOR SURGIFLO ENDO (HEMOSTASIS) IMPLANT
BAG LAPAROSCOPIC 12 15 PORT 16 (BASKET) IMPLANT
BAG RETRIEVAL 12/15 (BASKET)
BAG SPEC RTRVL LRG 6X4 10 (ENDOMECHANICALS) ×2
CELLS DAT CNTRL 66122 CELL SVR (MISCELLANEOUS) ×2 IMPLANT
CHLORAPREP W/TINT 26ML (MISCELLANEOUS) ×3 IMPLANT
COVER SURGICAL LIGHT HANDLE (MISCELLANEOUS) ×6 IMPLANT
COVER TIP SHEARS 8 DVNC (MISCELLANEOUS) ×2 IMPLANT
COVER TIP SHEARS 8MM DA VINCI (MISCELLANEOUS) ×1
DERMABOND ADVANCED (GAUZE/BANDAGES/DRESSINGS) ×1
DERMABOND ADVANCED .7 DNX12 (GAUZE/BANDAGES/DRESSINGS) ×2 IMPLANT
DRAPE ARM DVNC X/XI (DISPOSABLE) ×8 IMPLANT
DRAPE COLUMN DVNC XI (DISPOSABLE) ×2 IMPLANT
DRAPE DA VINCI XI ARM (DISPOSABLE) ×4
DRAPE DA VINCI XI COLUMN (DISPOSABLE) ×1
DRAPE INCISE IOBAN 66X45 STRL (DRAPES) ×3 IMPLANT
DRAPE SHEET LG 3/4 BI-LAMINATE (DRAPES) ×6 IMPLANT
DRAPE SURG IRRIG POUCH 19X23 (DRAPES) ×3 IMPLANT
DRSG OPSITE POSTOP 4X6 (GAUZE/BANDAGES/DRESSINGS) ×3 IMPLANT
ELECT PENCIL ROCKER SW 15FT (MISCELLANEOUS) ×3 IMPLANT
ELECT REM PT RETURN 15FT ADLT (MISCELLANEOUS) ×3 IMPLANT
GLOVE BIO SURGEON STRL SZ 6 (GLOVE) ×12 IMPLANT
GLOVE BIO SURGEON STRL SZ 6.5 (GLOVE) ×15 IMPLANT
GOWN STRL REUS W/ TWL LRG LVL3 (GOWN DISPOSABLE) ×4 IMPLANT
GOWN STRL REUS W/TWL LRG LVL3 (GOWN DISPOSABLE) ×4
HOLDER FOLEY CATH W/STRAP (MISCELLANEOUS) ×3 IMPLANT
IRRIG SUCT STRYKERFLOW 2 WTIP (MISCELLANEOUS) ×3
IRRIGATION SUCT STRKRFLW 2 WTP (MISCELLANEOUS) ×2 IMPLANT
KIT BASIN OR (CUSTOM PROCEDURE TRAY) ×3 IMPLANT
KIT PROCEDURE DA VINCI SI (MISCELLANEOUS)
KIT PROCEDURE DVNC SI (MISCELLANEOUS) IMPLANT
LIGASURE IMPACT 36 18CM CVD LR (INSTRUMENTS) ×3 IMPLANT
MANIPULATOR UTERINE 4.5 ZUMI (MISCELLANEOUS) ×3 IMPLANT
MARKER SKIN DUAL TIP RULER LAB (MISCELLANEOUS) ×3 IMPLANT
NDL SAFETY ECLIPSE 18X1.5 (NEEDLE) ×2 IMPLANT
NEEDLE HYPO 18GX1.5 SHARP (NEEDLE) ×3
NEEDLE SPNL 18GX3.5 QUINCKE PK (NEEDLE) ×3 IMPLANT
OBTURATOR OPTICAL STANDARD 8MM (TROCAR) ×1
OBTURATOR OPTICAL STND 8 DVNC (TROCAR) ×2
OBTURATOR OPTICALSTD 8 DVNC (TROCAR) ×2 IMPLANT
OCCLUDER COLPOPNEUMO (BALLOONS) ×3 IMPLANT
PAD POSITIONING PINK XL (MISCELLANEOUS) ×3 IMPLANT
PORT ACCESS TROCAR AIRSEAL 12 (TROCAR) ×2 IMPLANT
PORT ACCESS TROCAR AIRSEAL 5M (TROCAR) ×1
POUCH SPECIMEN RETRIEVAL 10MM (ENDOMECHANICALS) ×3 IMPLANT
RETRACTOR LAPSCP 12X46 CVD (ENDOMECHANICALS) ×2 IMPLANT
RTRCTR LAPSCP 12X46 CVD (ENDOMECHANICALS) ×3
RTRCTR WOUND ALEXIS 18CM MED (MISCELLANEOUS) ×3
SCISSORS LAP 5X35 DISP (ENDOMECHANICALS) ×3 IMPLANT
SEAL CANN UNIV 5-8 DVNC XI (MISCELLANEOUS) ×8 IMPLANT
SEAL XI 5MM-8MM UNIVERSAL (MISCELLANEOUS) ×4
SET TRI-LUMEN FLTR TB AIRSEAL (TUBING) ×3 IMPLANT
SHEET LAVH (DRAPES) ×3 IMPLANT
SOLUTION ELECTROLUBE (MISCELLANEOUS) ×3 IMPLANT
SURGIFLO W/THROMBIN 8M KIT (HEMOSTASIS) IMPLANT
SUT MNCRL AB 4-0 PS2 18 (SUTURE) ×9 IMPLANT
SUT PDS AB 1 TP1 96 (SUTURE) ×6 IMPLANT
SUT VIC AB 0 CT1 27 (SUTURE) ×2
SUT VIC AB 0 CT1 27XBRD ANTBC (SUTURE) ×2 IMPLANT
SUT VIC AB 3-0 SH 27 (SUTURE) ×2
SUT VIC AB 3-0 SH 27XBRD (SUTURE) ×2 IMPLANT
SYR 10ML LL (SYRINGE) ×3 IMPLANT
SYR 50ML LL SCALE MARK (SYRINGE) ×3 IMPLANT
TOWEL OR 17X26 10 PK STRL BLUE (TOWEL DISPOSABLE) ×6 IMPLANT
TOWEL OR NON WOVEN STRL DISP B (DISPOSABLE) ×3 IMPLANT
TRAP SPECIMEN MUCOUS 40CC (MISCELLANEOUS) IMPLANT
TRAY FOLEY W/METER SILVER 16FR (SET/KITS/TRAYS/PACK) ×3 IMPLANT
TRAY LAPAROSCOPIC (CUSTOM PROCEDURE TRAY) ×3 IMPLANT
TROCAR BLADELESS OPT 5 100 (ENDOMECHANICALS) ×3 IMPLANT
UNDERPAD 30X30 (UNDERPADS AND DIAPERS) ×3 IMPLANT
UNDERPAD 30X30 INCONTINENT (UNDERPADS AND DIAPERS) ×3 IMPLANT
WATER STERILE IRR 1500ML POUR (IV SOLUTION) ×6 IMPLANT
YANKAUER SUCT BULB TIP 10FT TU (MISCELLANEOUS) ×3 IMPLANT

## 2016-10-14 NOTE — Anesthesia Postprocedure Evaluation (Signed)
Anesthesia Post Note  Patient: Ellen Hunt  Procedure(s) Performed: Procedure(s) (LRB): XI ROBOTIC ASSISTED TOTAL LAPARSCOPIC  HYSTERECTOMY WITH BILATERAL SALPINGO OOPHORECTOMY (Bilateral) LAPAROTOMY WITH OMENTECTOMY AND TUMOR DEBULGING (N/A) OMENTECTOMY (N/A) DEBULKING (N/A)  Patient location during evaluation: PACU Anesthesia Type: General Level of consciousness: awake and alert Pain management: pain level controlled Vital Signs Assessment: post-procedure vital signs reviewed and stable Respiratory status: spontaneous breathing, nonlabored ventilation, respiratory function stable and patient connected to nasal cannula oxygen Cardiovascular status: blood pressure returned to baseline and stable Postop Assessment: no signs of nausea or vomiting Anesthetic complications: no    Last Vitals:  Vitals:   10/14/16 1230 10/14/16 1252  BP: (!) 152/65 (!) 171/69  Pulse: 97 99  Resp: 12 16  Temp: 37 C 37.1 C    Last Pain:  Vitals:   10/14/16 1215  TempSrc:   PainSc: [place]

## 2016-10-14 NOTE — Op Note (Signed)
OPERATIVE NOTE 10/14/16  Surgeon: Donaciano Eva   Assistants: Dr Lahoma Crocker (an MD assistant was necessary for tissue manipulation, management of robotic instrumentation, retraction and positioning due to the complexity of the case and hospital policies).   Anesthesia: General endotracheal anesthesia  ASA Class: 3   Pre-operative Diagnosis: stage IIIC ovarian cancer, s/p neoadjuvant chemotherapy  Post-operative Diagnosis: same  Operation: Robotic-assisted laparoscopic total hysterectomy with bilateral salpingoophorectomy, ex lap omentectomy, radical tumor debulking  Surgeon: Donaciano Eva  Assistant Surgeon: Lahoma Crocker MD  Anesthesia: GET  Urine Output: 500  Operative Findings:  : Large volume abdominal and retroperitoneal adiposity (BMI 70) which limited visualization of the pelvis and retroperitoneum and required additional personnel for positioning, and assistance/retraction, and additional ports and equipment to visualize safely. 8cm uterus with 4cm right lower uterine segment myoma. Ovaries with tumor implants overlying them. Scattered low volume (<1cm) nodularity throughout the peritoneal cavity on all surfaces. Dense omental cake. Clear diaphragms. Residual disease comprised <1cm3 disease implants on all surfaces of small intestine and peritoneum which constitutes and optimal cytoreductive effort.  Estimated Blood Loss:  200 mL      Total IV Fluids: 800 ml         Specimens: uterus, cervix, bilateral tubes and ovaries, omentum, umbilical nodule.         Complications:  None; patient tolerated the procedure well.         Disposition: PACU - hemodynamically stable.  Procedure Details  The patient was seen in the Holding Room. The risks, benefits, complications, treatment options, and expected outcomes were discussed with the patient.  The patient concurred with the proposed plan, giving informed consent.  The site of surgery properly  noted/marked. The patient was identified as Ellen Hunt and the procedure verified as a Robotic-assisted hysterectomy with bilateral salpingo oophorectomy,. Omentectomy, tumor debulking for ovarian cancer, possible ex lap. A Time Out was held and the above information confirmed.  After induction of anesthesia, the patient was draped and prepped in the usual sterile manner. Pt was placed in supine position after anesthesia and draped and prepped in the usual sterile manner. The abdominal drape was placed after the CholoraPrep had been allowed to dry for 3 minutes.  Her arms were tucked to her side with all appropriate precautions.  The shoulders were stabilized with padded shoulder blocks applied to the acromium processes.  The patient was placed in the semi-lithotomy position in [place].  The perineum was prepped with Betadine. The patient was then prepped. Foley catheter was placed.  A sterile speculum was placed in the vagina.  The cervix was grasped with a single-tooth tenaculum and dilated with Kennon Rounds dilators.  The ZUMI uterine manipulator with a medium colpotomizer ring was placed without difficulty.  A pneum occluder balloon was placed over the manipulator.  OG tube placement was confirmed and to suction.   Next, a 5 mm skin incision was made 1 cm below the subcostal margin in the midclavicular line.  The 5 mm Optiview port and scope was used for direct entry.  Opening pressure was under 10 mm CO2.  The abdomen was insufflated and the findings were noted as above.   At this point and all points during the procedure, the patient's intra-abdominal pressure did not exceed 15 mmHg. Next, a 10 mm skin incision was made 4cm above the umbilicus and a right and left port was placed about 10 cm lateral to the robot port on the right and left side.  A fourth arm was placed in the left lower quadrant 2 cm above and superior and medial to the anterior superior iliac spine.  All ports were placed under direct  visualization. The omental cake was densely aherent at the level of the umbilicus. Sharp dissection with endoshears released this and the residual tumor nodule on the umbilical peritoneum was resected with sharp dissection. The patient was placed in steep Trendelenburg.  Bowel was folded away into the upper abdomen.  However there was minimal movement of the bowel due to extreme adiposity. The robot was docked in the normal manner.  An additional 25mm left upper quadrant port was placed under direct visualization to facilitate retraction.  The hysterectomy was started after the round ligament on the right side was incised and the retroperitoneum was entered and the pararectal space was developed.  The ureter was noted to be on the medial leaf of the broad ligament.  The peritoneum above the ureter was incised and stretched and the infundibulopelvic ligament was skeletonized, cauterized and cut.  The posterior peritoneum was taken down to the level of the KOH ring.  The anterior peritoneum was also taken down.  A right anterior lower uterine segment fibroid was encountered. It was carefully retracted and the bladder and cardinal ligament was skeletonized off of this structure. The bladder flap was created to the level of the KOH ring.  The uterine artery on the right side was skeletonized, cauterized and cut in the normal manner.  A similar procedure was performed on the left.  The colpotomy was made and the uterus, cervix, bilateral ovaries and tubes were amputated and delivered through the vagina.  Pedicles were inspected and excellent hemostasis was achieved.    The colpotomy at the vaginal cuff was closed with Vicryl on a CT1 needle in a running manner.  Irrigation was used and excellent hemostasis was achieved.  At this point in the procedure was converted to an upper abdominal minilaparotomy incision for open omentectomy.  Robotic instruments were removed under direct visulaization.  The robot was  undocked.   The omentectomy was then performed. A midline vertical 6cm upper abdominal incision was made with the scalpel and the peritoneum was entered sharply. An alexis wound retractor was placed in the incision for retraction. The omentum was delivered into the incision and elevated. The parietal peritonum that attaches the omentum to the transverse colon was incised facilitating separation of the mid portion of the omentum from the transverse colon. The right sided omentum with its vascular pedicles was then skeletonized in its attachments to the right transverse colon. These vascular pedicles were bipolar sealed and transected. Observation of the colonic wall occurred throughout. Areas of bleeding vessels on the colonic wall were made hemostatic with interrupted 3-0 vicryl suture. The dissection then moved progressively along the colon the the left splenic flexure of the transverse colon. The bipolar sealing device and the bovie and metzenbaum scissors were used to skeletonize vascular omental pedicles, seal them and transect them until the entire infracolic omentum had been freed from its transverse colonic attachments to the splenic flexure. The omentum was sent for pathology. Hemostasis was observed.  The fascia was closed in a mass running closure with looped #1 PDS. 20 cc exparil was mixed with marcaine (20cc) and 20cc saline and infiltrated into the fascia and subcutaneous tissues.The skin was closed with 4-0 monocryl.  The 10 mm ports were closed with Vicryl on a UR-5 needle and the fascia was closed with 0 Vicryl  on a UR-5 needle.  The skin was closed with 4-0 Vicryl in a subcuticular manner.  Dermabond was applied.  Sponge, lap and needle counts correct x 2.  The patient was taken to the recovery room in stable condition.  The vagina was swabbed with  minimal bleeding noted.   All instrument and needle counts were correct x  3.   The patient was transferred to the recovery room in a stable  condition.  Donaciano Eva, MD

## 2016-10-14 NOTE — H&P (Signed)
H&P Note: Gyn-Onc  Consult was initially requested by Dr. Whitney Muse for the evaluation of Yanise Tumbleson 65 y.o. female  CC:      Chief Complaint  Patient presents with  . Ovarian cancer, unspecified laterality [facility] )    Per request of Dr Whitney Muse    Assessment/Plan:  Ms. Seta Matsubara  is a 65 y.o.  year old with stage IIIC ovarian/primary peritoneal/fallopian tube cancer with slow but progressive response to neoadjuvant chemotherapy with 7 cycles of neoadjuvant carboplatin and paclitaxel. Day1 cycle 7 09/18/16.  Her disease burden appears amenable to a surgical debulking effort, though it will likely involved a combined MIS and open approach in order to minimize incision size in this patient who is at a high risk for wound healing failure or infection.  I recommend 1 additional dose of chemotherapy with planned surgery 3 weeks later.  1/ anemia preop (chemotherapy induced) and thrombocytopenia - type and screened. Will transfuse for bleeding 2/ pre-existing E coli UTI (resistant to nitrofurantoin) - patient did not take antibiotics as prescribed preop. Will give dose of cefoxatin intraop and continue bactrim postop. 3/ preoperative bowel prep, cefoxatin antibiotics and lovenox 4/ schedule for robotic assisted total hysterectomy, BSO, and laparotomy for total omentectomy and tumor debulking surgery. Will proceed 5/ Lovenox for 4 weeks postop prophylaxis. 6/ Type II DM on insulin pump - discontinued pump today - will use sliding scale to control blood glucose perioperatively 7/ extreme morbid obesity - discussed increased risk for morbidity and infection in this setting, particularly failure of wound closure and SSI  We again discussed the high risk of complications particularly wound healing issues, infection, damage to internal organs, reoperation, death, VTE.  I discussed that her obesity, diabetes and recent chemotherapy place her at increased risk for this. She has experience  with wound healing issues requiring wound vac (her cesarean section).  I discussed that the role of surgery was not curative as a single strategy. I discussed that surgery cannot remove all disease, but instead works as an adjunct to chemotherapy to debulk the burden of disease and potentially reduce the number of necessary chemotherapy cycles.  HPI: Katoya Oblander is a 65 year old woman who is seen in consultation at the request of Dr Whitney Muse for clinical stage IIIC ovarian/fallopian tube or primary peritoneal cancer.  She has a history of feeling bloated and extended since June 1st, 2017. She was seen in the ED on 04/30/16 and a CT scan of the abdomen and pelvis was performed it revealed moderate volume ascites, masslike expansion of the uterine fundus, no adnexal masses seen. There was extensive omental caking compatible with peritoneal metastatic disease. No pathologic adenopathy was identified. 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 The patient was seen and evaluated by Dr. Whitney Muse with a plan in place for chemotherapy when appropriate.   She is morbidly obese with a weight of 247lbs. She has poorly controlled DM (last HbA1C was >9%) though she has more recently been on tighter control of her glucose with metformin and insulin.  She was diagnosed with shingles above her left eye immediately prior to starting chemotherapy. Between 05/15/16 (day 1 cycle 1) and 06/26/16 (day 1 cycle 3) she has received 3 doses of neoadjuvant carboplatin and paclitaxel. Her CA 125 was 7149 on 05/01/16 and increased to 11,113 on 05/16/16 on day of chemotherapy initiation. It was increased to 12,359 prior to cycle 2 and  decreased to 8,811 for day 1 of cycle 3.    Interval History:  Repeat CT scan after 3 cycles showed modest response. Given her modest CA 125 and CT response to 3 cycles of  chemotherapy, she was felt to not be a good candidate for interval debulking at that time. She received an additional 3 cycles of carboplatin and paclitaxel (last dose on 08/28/16). She has been tolerating chemotherapy well though does have issues with anemia and has required 2 blood transfusions.  CT imaging on 09/11/16 (after 6 cycles) showed No significant change omental soft tissue caking, consistent with metastatic disease. Mild ascites is decreased since previous study. Increased calcification along peritoneal surface in pelvic cul-de-sac, consistent with treated peritoneal metastatic disease. Stable 4.5cm homogeneous right pelvic mass, which favors a uterine fibroid although right ovarian neoplasm cannot definitely be excluded. No new or progressive metastatic disease identified. No evidence of metastatic disease within the thorax..  CA 125 on day `1 of cycle 7 (09/18/16) was 1200+   Current Meds:      Outpatient Encounter Prescriptions as of 09/17/2016  Medication Sig  . aspirin 81 MG tablet Take 1 tablet (81 mg total) by mouth daily. (Patient taking differently: Take 81 mg by mouth every evening. )  . atorvastatin (LIPITOR) 40 MG tablet TAKE 1 TABLET BY MOUTH EVERY DAY  . benazepril (LOTENSIN) 10 MG tablet TAKE 1 TABLET BY MOUTH EVERY DAY  . CARBOPLATIN IV Inject into the vein. Every 21 days  . cephALEXin (KEFLEX) 500 MG capsule Take 2 capsules (1,000 mg total) by mouth 2 (two) times daily.  . diazepam (VALIUM) 5 MG tablet Take 0.5 tablets (2.5 mg total) by mouth every 4 (four) hours as needed (spasms).  . furosemide (LASIX) 20 MG tablet Take 1 tablet (20 mg total) by mouth daily.  Marland Kitchen glucagon (GLUCAGON EMERGENCY) 1 MG injection Inject as directed as needed for low blood glucose if unresponsive.  Marland Kitchen glucosamine-chondroitin 500-400 MG tablet Take 1 tablet by mouth 2 (two) times daily.  Marland Kitchen glucose blood (ONE TOUCH ULTRA TEST) test strip USE TO CHECK BLOOD SUGAR UP TO 5 TIMES A DAY   . HYDROcodone-acetaminophen (NORCO) 10-325 MG tablet Take 1 tablet by mouth every 4 (four) hours as needed.  . lidocaine-prilocaine (EMLA) cream Apply a quarter size amount to port site 1 hour prior to chemo. Do not rub in. Cover with plastic wrap.  . metFORMIN (GLUCOPHAGE) 1000 MG tablet TAKE 1 TABLET BY MOUTH TWICE DAILY WITH A MEAL  . Multiple Vitamin (MULTIVITAMIN WITH MINERALS) TABS Take 1 tablet by mouth daily.  Marland Kitchen NOVOLOG 100 UNIT/ML injection USE IN INSULIN PUMP AS DIRECTED (MAX DAILY DOSE IS 110 UNITS PER DAY)  . omeprazole (PRILOSEC) 40 MG capsule TAKE 1 CAPSULE(40 MG) BY MOUTH DAILY  . ondansetron (ZOFRAN) 8 MG tablet Take 1 tablet (8 mg total) by mouth every 8 (eight) hours as needed for nausea or vomiting.  Marland Kitchen PACLitaxel (TAXOL IV) Inject into the vein. Every 21 days  . [DISCONTINUED] dexamethasone (DECADRON) 4 MG tablet The day before chemo take 5 tabs (20mg  total) in the am and 5 tabs (20mg  total) in the pm. The morning of chemo take 5 tabs (20mg  total).  . [DISCONTINUED] valACYclovir (VALTREX) 500 MG tablet Take 1 tablet (500 mg total) by mouth 2 (two) times daily. Start after completion of 1000 mg po tid  . [DISCONTINUED] prochlorperazine (COMPAZINE) 10 MG tablet Take 1 tablet (10 mg total) by mouth every 6 (six) hours as  needed for nausea or vomiting.   No facility-administered encounter medications on file as of 09/17/2016.     Allergy:       Allergies  Allergen Reactions  . Avandia [Rosiglitazone] Other (See Comments)    Legs swelled  . Micronase [Glyburide] Swelling  . Actos [Pioglitazone] Other (See Comments)    Edema / leg swelling    Social Hx:   Social History        Social History  . Marital status: Married    Spouse name: Gershon Mussel  . Number of children: 2  . Years of education: N/A      Occupational History  . Not on file.       Social History Main Topics  . Smoking status: Never Smoker  . Smokeless tobacco: Never Used  . Alcohol use No   . Drug use: No  . Sexual activity: Not on file       Other Topics Concern  . Not on file      Social History Narrative  . No narrative on file    Past Surgical Hx:       Past Surgical History:  Procedure Laterality Date  . CESAREAN SECTION    . LUMBAR FUSION  08/21/15   L3-L4 Dr. Timmothy Euler    Past Medical Hx:      Past Medical History:  Diagnosis Date  . Abnormal CT of the abdomen 04/30/2016  . Diabetes mellitus without complication ([place])   . Family history of breast cancer   . Hyperlipidemia   . Hypertension   . Low serum vitamin D   . Ovarian cancer ([place]) 05/09/2016    Past Gynecological History:  No LMP recorded. Patient is postmenopausal.  Family Hx:        Family History  Problem Relation Age of Onset  . Lung cancer Mother     smoker; dx in her 55s  . Leukemia Father   . Diabetes Paternal Grandmother   . Heart attack Paternal Grandmother   . Diabetes Paternal Grandfather   . Breast cancer Paternal Aunt     dx in her 26s-30s  . Leukemia Paternal Uncle   . Heart attack Maternal Grandfather   . Breast cancer Cousin     maternal first cousin    Review of Systems:  Constitutional  Feels fatigued  ENT Normal appearing ears and nares bilaterally Skin/Breast  resolved right temporal and eyebrow vesicular rash Cardiovascular  No chest pain, shortness of breath, or edema  Pulmonary  No cough or wheeze.  Gastro Intestinal  improved bloating, nausea, early satiety Genito Urinary  No frequency, urgency, dysuria, no bleeding Musculo Skeletal  No myalgia, arthralgia, joint swelling or pain  Neurologic  No weakness, numbness, change in gait,  Psychology  No depression, anxiety, insomnia.   Vitals:  Blood pressure (!) 127/51, pulse 94, temperature 97.8 F (36.6 C), temperature source Oral, resp. rate 18, height 5\' 4"  (1.626 m), weight 215 lb 3.2 oz (97.6 kg), SpO2 98 %.  Physical Exam: WD in NAD Neck   Supple NROM, without any enlargements.  Lymph Node Survey No cervical supraclavicular or inguinal adenopathy Cardiovascular  Pulse normal rate, regularity and rhythm. S1 and S2 normal.  Lungs  Clear to auscultation bilateraly, without wheezes/crackles/rhonchi. Good air movement.  Skin  No rash/lesions/breakdown  Psychiatry  Alert and oriented to person, place, and time  Abdomen  Normoactive bowel sounds, abdomen soft, non-tender and obese without evidence of hernia. Vertical midline incision from prior cesarean.  Back No CVA tenderness Genito Urinary  Vulva/vagina: Normal external female genitalia.  No lesions. No discharge or bleeding.             Bladder/urethra:  No lesions or masses, well supported bladder             Vagina: normal             Cervix: Normal appearing, no lesions.             Uterus: Small, mobile, no parametrial involvement or nodularity.             Adnexa: no palpable masses. Rectal  Good tone, no masses no cul de sac nodularity.  Extremities  No bilateral cyanosis, clubbing or edema.   Donaciano Eva, MD

## 2016-10-14 NOTE — Progress Notes (Signed)
Dr Smith Robert notified [facility], ordered to follow Sliding scale ordered by Dr Denman George, 11 Units Novolog sub q.

## 2016-10-14 NOTE — Discharge Instructions (Addendum)
10/14/2016  Return to work: 4-6 weeks if applicable  Activity: 1. Be up and out of the bed during the day.  Take a nap if needed.  You may walk up steps but be careful and use the hand rail.  Stair climbing will tire you more than you think, you may need to stop part way and rest.   2. No lifting or straining for 6 weeks.  3. No driving for 1 week(s).  Do not drive if you are taking narcotic pain medicine.  4. Shower daily.  Use soap and water on your incision and pat dry; don't rub.  No tub baths until cleared by your surgeon.   5. No sexual activity and nothing in the vagina for 8 weeks.  6. You may experience a small amount of clear drainage from your incisions, which is normal.  If the drainage persists or increases, please call the office.  7. You may experience vaginal spotting after surgery or around the 6-8 week mark from surgery when the stitches at the top of the vagina begin to dissolve.  The spotting is normal but if you experience heavy bleeding, call our office.  Diet: 1. Low sodium Heart Healthy Diet is recommended.  2. It is safe to use a laxative, such as Miralax or Colace or Senna, if you have difficulty moving your bowels.   Medications: You will be prescribed tramadol for pain  You should take lovenox injections once daily for 27 additional days postoperatively  Wound Care: 1. Keep clean and dry.  Shower daily.  Reasons to call the Doctor:  Fever - Oral temperature greater than 100.4 degrees Fahrenheit  Foul-smelling vaginal discharge  Difficulty urinating  Nausea and vomiting  Increased pain at the site of the incision that is unrelieved with pain medicine.  Difficulty breathing with or without chest pain  New calf pain especially if only on one side  Sudden, continuing increased vaginal bleeding with or without clots.   Contacts: For questions or concerns you should contact:  Dr. Everitt Amber at [phone]  Joylene John, NP at  [phone]  After Hours: call [phone] and have the GYN Oncologist paged/contacted

## 2016-10-14 NOTE — Anesthesia Procedure Notes (Addendum)
Procedure Name: Intubation Date/Time: 10/14/2016 7:49 AM Performed by: Cynda Familia Pre-anesthesia Checklist: Patient identified, Emergency Drugs available, Suction available and Patient being monitored Patient Re-evaluated:Patient Re-evaluated prior to inductionOxygen Delivery Method: Circle System Utilized Preoxygenation: Pre-oxygenation with 100% oxygen Intubation Type: IV induction Ventilation: Mask ventilation without difficulty Grade View: Grade III Tube type: Oral Number of attempts: 1 Airway Equipment and Method: Stylet,  Oral airway and Video-laryngoscopy Placement Confirmation: ETT inserted through vocal cords under direct vision,  positive ETCO2 and breath sounds checked- equal and bilateral Secured at: 22 cm Tube secured with: Tape Dental Injury: Teeth and Oropharynx as per pre-operative assessment and Injury to lip  Comments: Smooth IV induction Hollis--- intubation AM CRNA attempted with Sabra Heck 2- no view of cords--- Glidescope good view--- unable to place ETT pt coughing---Hollis IV Roc--- Hollis intubation Glidescope 3----   Small mouth----very anterior----- DIFFICULT INTUBATION---- bilat BS Hollis

## 2016-10-14 NOTE — Transfer of Care (Signed)
Immediate Anesthesia Transfer of Care Note  Patient: Ellen Hunt  Procedure(s) Performed: Procedure(s): XI ROBOTIC ASSISTED TOTAL LAPARSCOPIC  HYSTERECTOMY WITH BILATERAL SALPINGO OOPHORECTOMY (Bilateral) LAPAROTOMY WITH OMENTECTOMY AND TUMOR DEBULGING (N/A) OMENTECTOMY (N/A) DEBULKING (N/A)  Patient Location: PACU  Anesthesia Type:General  Level of Consciousness: awake and alert   Airway & Oxygen Therapy: Patient Spontanous Breathing and Patient connected to face mask oxygen  Post-op Assessment: Report given to RN and Post -op Vital signs reviewed and stable  Post vital signs: Reviewed and stable  Last Vitals:  Vitals:   10/14/16 0600  BP: (!) 141/58  Pulse: 95  Resp: 16  Temp: 36.9 C    Last Pain:  Vitals:   10/14/16 0600  TempSrc: Oral         Complications: No apparent anesthesia complications

## 2016-10-15 ENCOUNTER — Other Ambulatory Visit (HOSPITAL_COMMUNITY): Payer: BC Managed Care – PPO

## 2016-10-15 DIAGNOSIS — C562 Malignant neoplasm of left ovary: Secondary | ICD-10-CM | POA: Diagnosis not present

## 2016-10-15 LAB — CBC
HCT: 25.1 % — ABNORMAL LOW (ref 36.0–46.0)
HEMOGLOBIN: 8.4 g/dL — AB (ref 12.0–15.0)
MCH: 34.1 pg — ABNORMAL HIGH (ref 26.0–34.0)
MCHC: 33.5 g/dL (ref 30.0–36.0)
MCV: 102 fL — ABNORMAL HIGH (ref 78.0–100.0)
Platelets: 147 10*3/uL — ABNORMAL LOW (ref 150–400)
RBC: 2.46 MIL/uL — ABNORMAL LOW (ref 3.87–5.11)
RDW: 21.7 % — AB (ref 11.5–15.5)
WBC: 11.9 10*3/uL — ABNORMAL HIGH (ref 4.0–10.5)

## 2016-10-15 LAB — BASIC METABOLIC PANEL
Anion gap: 6 (ref 5–15)
BUN: 17 mg/dL (ref 6–20)
CALCIUM: 8.8 mg/dL — AB (ref 8.9–10.3)
CO2: 26 mmol/L (ref 22–32)
CREATININE: 0.87 mg/dL (ref 0.44–1.00)
Chloride: 105 mmol/L (ref 101–111)
GFR calc non Af Amer: >60 mL/min (ref >60)
Glucose, Bld: 211 mg/dL — ABNORMAL HIGH (ref 65–99)
Potassium: 4.7 mmol/L (ref 3.5–5.1)
SODIUM: 137 mmol/L (ref 135–145)

## 2016-10-15 LAB — GLUCOSE, CAPILLARY: GLUCOSE-CAPILLARY: 198 mg/dL — AB (ref 65–99)

## 2016-10-15 MED ORDER — IBUPROFEN 800 MG PO TABS: 800.0000 mg | ORAL_TABLET | Freq: Three times a day (TID) | ORAL | 0 refills | Status: DC | PRN

## 2016-10-15 MED ORDER — TRAMADOL HCL 50 MG PO TABS: 100.0000 mg | ORAL_TABLET | Freq: Four times a day (QID) | ORAL | 0 refills | Status: DC | PRN

## 2016-10-15 MED ORDER — ENOXAPARIN SODIUM 40 MG/0.4ML ~~LOC~~ SOLN: 40.0000 mg | SUBCUTANEOUS | 0 refills | Status: DC

## 2016-10-15 MED ORDER — ONDANSETRON HCL 4 MG PO TABS: 4.0000 mg | ORAL_TABLET | Freq: Four times a day (QID) | ORAL | 0 refills | Status: DC | PRN

## 2016-10-15 MED ORDER — SENNA 8.6 MG PO TABS: 1.0000 | ORAL_TABLET | Freq: Every evening | ORAL | 0 refills | Status: DC | PRN

## 2016-10-15 NOTE — Discharge Summary (Signed)
Physician Discharge Summary  Patient ID: Ellen Hunt MRN: [id number] DOB/AGE: Mar 22, 1951 65 y.o.  Admit date: 10/14/2016 Discharge date: 10/15/2016  Admission Diagnoses: Ovarian cancer [facility])  Discharge Diagnoses:  Principal Problem:   Ovarian cancer ([place]) Active Problems:   Type 2 diabetes mellitus treated with insulin (HCC)   Severe obesity (BMI >= 40) (HCC)   Antineoplastic chemotherapy induced anemia   Infection of urinary tract   Discharged Condition: good  Hospital Course:  1/ patient admitted on 10/14/16 for robotic assisted interval debulking procedure for stage IIIC ovarian cancer, s/p 7 cycles of neoadjuvant chemotherapy 2/ findings were significant for modest response to chemotherapy with persistent bulky omental cake, milliary disease overlying small and large intestines and peritoneum (which remained residual at the completion of the surgery), and small volume disease overlying ovaries. 3/ a robotic assisted total hysterectomy, BSO was performed with minilaparotomy in the upper abdomen for hand assisted total omentectomy. 4/ postoperatively she did well with a postop Hb of 8.6 (she was anemic to 10 preop). 5/ She was meeting discharge criteria on POD 1 including tolerating PO and desired discharge 6/ we had a lengthy discussion prior to discharge regarding operative findings, ovarian cancer prognosis and anticipated course of treatment  - she will continue chemotherapy until she has had a complete radiographic and clinical response. I suggested that she may never have a complete response given the slow response to first line therapy, and it is realistic to expect that she will continue to have persistent disease. 7/ home on lovenox x 28 days for VTE risk  Consults: None  Significant Diagnostic Studies: labs:  CBC    Component Value Date/Time   WBC 11.9 (H) 10/15/2016 0435   RBC 2.46 (L) 10/15/2016 0435   HGB 8.4 (L) 10/15/2016 0435   HCT 25.1 (L) 10/15/2016 0435    PLT 147 (L) 10/15/2016 0435   MCV 102.0 (H) 10/15/2016 0435   MCV 87.0 12/21/2013 1200   MCH 34.1 (H) 10/15/2016 0435   MCHC 33.5 10/15/2016 0435   RDW 21.7 (H) 10/15/2016 0435   LYMPHSABS 0.5 (L) 10/09/2016 1415   MONOABS 0.8 10/09/2016 1415   EOSABS 0.1 10/09/2016 1415   BASOSABS 0.0 10/09/2016 1415     Treatments: surgery: see above  Discharge Exam: Blood pressure 125/82, pulse 92, temperature 98.4 F (36.9 C), temperature source Oral, resp. rate 18, height 5\' 3"  (1.6 m), weight 209 lb 8 oz (95 kg), SpO2 96 %. General appearance: alert Resp: clear to auscultation bilaterally Cardio: regular rate and rhythm, S1, S2 normal, no murmur, click, rub or gallop GI: soft, non-tender; bowel sounds normal; no masses,  no organomegaly and morbidly obese Incision/Wound: clean and dry with dressing and other laparoscopic incisions appear normal.  Disposition: 01-Home or Self Care  Discharge Instructions    (HEART FAILURE PATIENTS) Call MD:  Anytime you have any of the following symptoms: 1) 3 pound weight gain in 24 hours or 5 pounds in 1 week 2) shortness of breath, with or without a dry hacking cough 3) swelling in the hands, feet or stomach 4) if you have to sleep on extra pillows at night in order to breathe.    Complete by:  As directed    Call MD for:  difficulty breathing, headache or visual disturbances    Complete by:  As directed    Call MD for:  extreme fatigue    Complete by:  As directed    Call MD for:  hives  Complete by:  As directed    Call MD for:  persistant dizziness or light-headedness    Complete by:  As directed    Call MD for:  persistant nausea and vomiting    Complete by:  As directed    Call MD for:  redness, tenderness, or signs of infection (pain, swelling, redness, odor or green/yellow discharge around incision site)    Complete by:  As directed    Call MD for:  severe uncontrolled pain    Complete by:  As directed    Call MD for:  temperature >100.4     Complete by:  As directed    Diet - low sodium heart healthy    Complete by:  As directed    Diet general    Complete by:  As directed    Driving Restrictions    Complete by:  As directed    No driving for 7 days or until off narcotic pain medication   Increase activity slowly    Complete by:  As directed    Remove dressing in 24 hours    Complete by:  As directed    Sexual Activity Restrictions    Complete by:  As directed    No intercourse for 6 weeks      Follow-up Information    Donaciano Eva, MD Follow up on 10/29/2016.   Specialty:  Obstetrics and Gynecology Why:  at 3pm at the [facility] for post-operative follow up Contact information: [address] [place] [address] [phone]           Signed: Donaciano Eva 10/15/2016, 8:54 AM

## 2016-10-16 ENCOUNTER — Telehealth: Payer: Self-pay | Admitting: Nurse Practitioner

## 2016-10-16 NOTE — Telephone Encounter (Signed)
Patient calling to ask if she should stop taking baby aspirin while on lovenox inj post-op. She reports "I don't need the aspirin, I just have been on it for a long time." Dr. Whitney Muse instructed pt to hold aspirin while awaiting surgery, which patient did, now is post-op and starting lovenox therapy. Per Joylene John, NP, OK to hold aspirin while on lovenox. Patient verbalizes understanding.

## 2016-10-18 LAB — TYPE AND SCREEN
Blood Product Expiration Date: 201712272359
Blood Product Expiration Date: 201712282359
ISSUE DATE / TIME: 201712120813
ISSUE DATE / TIME: 201712120813
UNIT TYPE AND RH: 5100
Unit Type and Rh: 5100

## 2016-10-20 ENCOUNTER — Telehealth: Payer: Self-pay | Admitting: Pharmacist

## 2016-10-20 ENCOUNTER — Other Ambulatory Visit: Payer: Self-pay | Admitting: Family Medicine

## 2016-10-20 MED ORDER — INSULIN ASPART 100 UNIT/ML ~~LOC~~ SOLN: SUBCUTANEOUS | 0 refills | Status: DC

## 2016-10-20 NOTE — Telephone Encounter (Signed)
Rx for 90 day supply sent to [facility] per patient request.

## 2016-10-22 ENCOUNTER — Encounter (HOSPITAL_COMMUNITY): Payer: Self-pay

## 2016-10-22 ENCOUNTER — Other Ambulatory Visit (HOSPITAL_COMMUNITY): Payer: Self-pay | Admitting: Oncology

## 2016-10-22 ENCOUNTER — Telehealth: Payer: Self-pay | Admitting: Family Medicine

## 2016-10-22 ENCOUNTER — Encounter (HOSPITAL_COMMUNITY): Payer: BC Managed Care – PPO

## 2016-10-22 ENCOUNTER — Encounter (HOSPITAL_BASED_OUTPATIENT_CLINIC_OR_DEPARTMENT_OTHER): Payer: BC Managed Care – PPO

## 2016-10-22 ENCOUNTER — Ambulatory Visit (HOSPITAL_COMMUNITY): Payer: BC Managed Care – PPO | Admitting: Oncology

## 2016-10-22 VITALS — BP 124/55 | HR 89 | Temp 98.1°F | Resp 16

## 2016-10-22 DIAGNOSIS — C569 Malignant neoplasm of unspecified ovary: Secondary | ICD-10-CM

## 2016-10-22 DIAGNOSIS — R935 Abnormal findings on diagnostic imaging of other abdominal regions, including retroperitoneum: Secondary | ICD-10-CM | POA: Diagnosis not present

## 2016-10-22 DIAGNOSIS — D630 Anemia in neoplastic disease: Secondary | ICD-10-CM

## 2016-10-22 DIAGNOSIS — E538 Deficiency of other specified B group vitamins: Secondary | ICD-10-CM | POA: Diagnosis not present

## 2016-10-22 LAB — CBC WITH DIFFERENTIAL/PLATELET
Basophils Absolute: 0 10*3/uL (ref 0.0–0.1)
Basophils Relative: 0 %
EOS ABS: 0.3 10*3/uL (ref 0.0–0.7)
EOS PCT: 6 %
HCT: 24.2 % — ABNORMAL LOW (ref 36.0–46.0)
Hemoglobin: 8.1 g/dL — ABNORMAL LOW (ref 12.0–15.0)
LYMPHS ABS: 0.5 10*3/uL — AB (ref 0.7–4.0)
Lymphocytes Relative: 9 %
MCH: 34.9 pg — AB (ref 26.0–34.0)
MCHC: 33.5 g/dL (ref 30.0–36.0)
MCV: 104.3 fL — ABNORMAL HIGH (ref 78.0–100.0)
Monocytes Absolute: 0.4 10*3/uL (ref 0.1–1.0)
Monocytes Relative: 8 %
Neutro Abs: 4 10*3/uL (ref 1.7–7.7)
Neutrophils Relative %: 77 %
PLATELETS: 182 10*3/uL (ref 150–400)
RBC: 2.32 MIL/uL — AB (ref 3.87–5.11)
RDW: 20.6 % — AB (ref 11.5–15.5)
WBC: 5.2 10*3/uL (ref 4.0–10.5)

## 2016-10-22 LAB — SAMPLE TO BLOOD BANK

## 2016-10-22 LAB — PREPARE RBC (CROSSMATCH)

## 2016-10-22 MED ORDER — ONDANSETRON HCL 8 MG PO TABS: 8.0000 mg | ORAL_TABLET | Freq: Three times a day (TID) | ORAL | 2 refills | Status: DC | PRN

## 2016-10-22 MED ORDER — CYANOCOBALAMIN 1000 MCG/ML IJ SOLN
INTRAMUSCULAR | Status: AC
  Filled 2016-10-22: qty 1

## 2016-10-22 MED ORDER — CYANOCOBALAMIN 1000 MCG/ML IJ SOLN
1000.0000 ug | Freq: Once | INTRAMUSCULAR | Status: AC
  Administered 2016-10-22: 1000 ug via INTRAMUSCULAR

## 2016-10-22 NOTE — Patient Instructions (Signed)
[place] at [facility] Discharge Instructions  RECOMMENDATIONS MADE BY THE CONSULTANT AND ANY TEST RESULTS WILL BE SENT TO YOUR REFERRING PHYSICIAN.  B-12 injection today Follow up tomorrow morning for transfusion.  Thank you for choosing [place] at [facility] to provide your oncology and hematology care.  To afford each patient quality time with our provider, please arrive at least 15 minutes before your scheduled appointment time.   Beginning January 23rd 2017 lab work for the [facility] will be done in the  Main lab at [facility] on 1st floor. If you have a lab appointment with the [facility] please come in thru the  Main Entrance and check in at the main information desk  You need to re-schedule your appointment should you arrive 10 or more minutes late.  We strive to give you quality time with our providers, and arriving late affects you and other patients whose appointments are after yours.  Also, if you no show three or more times for appointments you may be dismissed from the clinic at the providers discretion.     Again, thank you for choosing [facility].  Our hope is that these requests will decrease the amount of time that you wait before being seen by our physicians.       _____________________________________________________________  Should you have questions after your visit to [facility], please contact our office at [phone] between the hours of 8:30 a.m. and 4:30 p.m.  Voicemails left after 4:30 p.m. will not be returned until the following business day.  For prescription refill requests, have your pharmacy contact our office.         Resources For Cancer Patients and their Caregivers ? American Cancer Society: Can assist with transportation, wigs, general needs, runs Look Good Feel Better.        (580) 062-1234 ? Cancer Care: Provides financial assistance, online support  groups, medication/co-pay assistance.  1-800-813-HOPE (713) 407-5829) ? [facility] Assists [place] cancer patients and their families through emotional , educational and financial support.  (289)721-2079 ? [place] DSS Where to apply for food stamps, Medicaid and utility assistance. (406)668-6269 ? [employer]ATS: Transportation to medical appointments. 253-657-2540 ? Social Security Administration: May apply for disability if have a Stage IV cancer. (415)225-2593 785 550 1828 ? [employer], Disability and Transit Services: Assists with nutrition, care and transit needs. [place] Support Programs: @10RELATIVEDAYS @ > Cancer Support Group  2nd Tuesday of the month 1pm-2pm, Journey Room  > Creative Journey  3rd Tuesday of the month 1130am-1pm, Journey Room  > Look Good Feel Better  1st Wednesday of the month 10am-12 noon, Journey Room (Call [place] to register [phone])

## 2016-10-22 NOTE — Addendum Note (Signed)
Addended by: Joie Bimler on: 10/22/2016 11:46 AM   Modules accepted: Orders, SmartSet

## 2016-10-22 NOTE — Telephone Encounter (Signed)
Patient's max daily dose if 110 units of insulin - a 90 days supply is 9900 or 73ml - rounded up this would be 10 vials or 123ml When she was getting 30 days supply 30 days was 54ml and rounded up to 40 mL or 4 bottles.  Rx that was sent in was correct.

## 2016-10-22 NOTE — Progress Notes (Signed)
Ellen Hunt presents today for injection per MD orders. B12 1078mcg administered SQ in left Upper Arm. Administration without incident. Patient tolerated well. VS stable, patient discharged ambulatory from clinic, follow up as scheduled.     Per [name], PA-C patient can receive 1 unit of RBC if patient desires based on her blood work results.   Patient verbalizes understanding and agrees, will schedule appointment for infusion.  Patient requested to remove dressing from abdominal  Surgical sites, states was told by surgeon to remove dressing 7 days post op.  Dressing removed, incisions clean, dry and intact.

## 2016-10-23 ENCOUNTER — Encounter (HOSPITAL_BASED_OUTPATIENT_CLINIC_OR_DEPARTMENT_OTHER): Payer: BC Managed Care – PPO

## 2016-10-23 ENCOUNTER — Other Ambulatory Visit: Payer: Self-pay | Admitting: Family Medicine

## 2016-10-23 ENCOUNTER — Encounter (HOSPITAL_COMMUNITY): Payer: Self-pay

## 2016-10-23 DIAGNOSIS — C569 Malignant neoplasm of unspecified ovary: Secondary | ICD-10-CM | POA: Diagnosis not present

## 2016-10-23 DIAGNOSIS — D6481 Anemia due to antineoplastic chemotherapy: Secondary | ICD-10-CM

## 2016-10-23 DIAGNOSIS — R935 Abnormal findings on diagnostic imaging of other abdominal regions, including retroperitoneum: Secondary | ICD-10-CM | POA: Diagnosis not present

## 2016-10-23 DIAGNOSIS — D630 Anemia in neoplastic disease: Secondary | ICD-10-CM

## 2016-10-23 MED ORDER — SODIUM CHLORIDE 0.9% FLUSH: 10.0000 mL | INTRAVENOUS | Status: DC | PRN

## 2016-10-23 MED ORDER — DIPHENHYDRAMINE HCL 25 MG PO CAPS
25.0000 mg | ORAL_CAPSULE | Freq: Once | ORAL | Status: AC
  Administered 2016-10-23: 25 mg via ORAL

## 2016-10-23 MED ORDER — HEPARIN SOD (PORK) LOCK FLUSH 100 UNIT/ML IV SOLN
INTRAVENOUS | Status: AC
  Filled 2016-10-23: qty 5

## 2016-10-23 MED ORDER — ACETAMINOPHEN 325 MG PO TABS
ORAL_TABLET | ORAL | Status: AC
  Filled 2016-10-23: qty 2

## 2016-10-23 MED ORDER — HEPARIN SOD (PORK) LOCK FLUSH 100 UNIT/ML IV SOLN
500.0000 [IU] | Freq: Every day | INTRAVENOUS | Status: DC | PRN
  Filled 2016-10-23: qty 5

## 2016-10-23 MED ORDER — DIPHENHYDRAMINE HCL 25 MG PO CAPS
ORAL_CAPSULE | ORAL | Status: AC
  Filled 2016-10-23: qty 1

## 2016-10-23 MED ORDER — SODIUM CHLORIDE 0.9 % IV SOLN
250.0000 mL | Freq: Once | INTRAVENOUS | Status: AC
  Administered 2016-10-23: 250 mL via INTRAVENOUS

## 2016-10-23 MED ORDER — ACETAMINOPHEN 325 MG PO TABS
650.0000 mg | ORAL_TABLET | Freq: Once | ORAL | Status: AC
Start: 2016-10-23 — End: 2016-10-23
  Administered 2016-10-23: 650 mg via ORAL

## 2016-10-23 NOTE — Patient Instructions (Signed)
[facility] at [facility] Discharge Instructions  RECOMMENDATIONS MADE BY THE CONSULTANT AND ANY TEST RESULTS WILL BE SENT TO YOUR REFERRING PHYSICIAN.  One unit of blood today.    Thank you for choosing [facility] to provide your oncology and hematology care.  To afford each patient quality time with our provider, please arrive at least 15 minutes before your scheduled appointment time.   Beginning January 23rd 2017 lab work for the Cancer Center will be done in the  Main lab at [facility] on 1st floor. If you have a lab appointment with the Cancer Center please come in thru the  Main Entrance and check in at the main information desk  You need to re-schedule your appointment should you arrive 10 or more minutes late.  We strive to give you quality time with our providers, and arriving late affects you and other patients whose appointments are after yours.  Also, if you no show three or more times for appointments you may be dismissed from the clinic at the providers discretion.     Again, thank you for choosing [facility].  Our hope is that these requests will decrease the amount of time that you wait before being seen by our physicians.       _____________________________________________________________  Should you have questions after your visit to  Cancer Center, please contact our office at [phone] between the hours of 8:30 a.m. and 4:30 p.m.  Voicemails left after 4:30 p.m. will not be returned until the following business day.  For prescription refill requests, have your pharmacy contact our office.         Resources For Cancer Patients and their Caregivers ? American Cancer Society: Can assist with transportation, wigs, general needs, runs Look Good Feel Better.        1-888-227-6333 ? Cancer Care: Provides financial assistance, online support groups, medication/co-pay assistance.   1-800-813-HOPE (4673) ? [facility] Assists [place] cancer patients and their families through emotional , educational and financial support.  336-427-4357 ? [place] DSS Where to apply for food stamps, Medicaid and utility assistance. 336-342-1394 ? RCATS: Transportation to medical appointments. 336-347-2287 ? Social Security Administration: May apply for disability if have a Stage IV cancer. 336-342-7796 1-800-772-1213 ? [place] Aging, Disability and Transit Services: Assists with nutrition, care and transit needs. 336-349-2343  Cancer Center Support Programs: @10RELATIVEDAYS@ > Cancer Support Group  2nd Tuesday of the month 1pm-2pm, Journey Room  > Creative Journey  3rd Tuesday of the month 1130am-1pm, Journey Room  > Look Good Feel Better  1st Wednesday of the month 10am-12 noon, Journey Room (Call American Cancer [employer] to register 1-800-395-5775)    

## 2016-10-23 NOTE — Progress Notes (Signed)
Patient tolerated transfusion well.  VSS.  Patient ambulatory and stable upon discharge from the clinic.

## 2016-10-24 ENCOUNTER — Ambulatory Visit (HOSPITAL_COMMUNITY): Payer: BC Managed Care – PPO

## 2016-10-24 LAB — TYPE AND SCREEN
ABO/RH(D): O POS
Antibody Screen: NEGATIVE
UNIT DIVISION: 0

## 2016-10-26 ENCOUNTER — Encounter (HOSPITAL_COMMUNITY): Payer: Self-pay | Admitting: Hematology & Oncology

## 2016-10-28 NOTE — Progress Notes (Signed)
Consult Note: Gyn-Onc  Consult was requested by Dr. Whitney Muse for the evaluation of Ellen Hunt 65 y.o. female  CC:  Chief Complaint  Patient presents with  . Ovarian Cancer    Assessment/Plan:  Ellen Hunt  is a 65 y.o.  year old with stage IIIC ovarian/primary peritoneal/fallopian tube cancer with slow but progressive response to neoadjuvant chemotherapy with carboplatin and paclitaxel.  S/p 7 cycles adjuvant chemotherapy with carb/taxol. S/p interval optimal cytoreduction with hysterectomy, BSO, omentectomy on 10/14/16 with R1 residual disease. Healing well postoperatively with no issues. She is cleared to resume chemotherapy.  I am recommending at least 3 additional cycles of adjuvant chemotherapy with carboplatin and paclitaxel. I would recommend repeat imaging and CA 125 at that time. If she is disease free, therapy can stop at that time, however, if disease persists, she will require ongoing therapy.  I discussed with Ellen Hunt that due to her modest and slow response to primary therapy, prognosis is poorer, and she has a high probability for developing platinum resistance.  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  She has a history of feeling bloated and extended since June 1st, 2017. She was seen in the ED on 04/30/16 and a CT scan of the abdomen and pelvis was performed it revealed moderate volume ascites, masslike expansion of the uterine fundus, no adnexal masses seen. There was extensive omental caking compatible with peritoneal metastatic disease. No pathologic adenopathy was identified. 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 The patient was seen and evaluated by Dr. Whitney Muse with a plan in place for chemotherapy when  appropriate.   She is morbidly obese with a weight of 247lbs. She has poorly controlled DM (last HbA1C was >9%) though she has more recently been on tighter control of her glucose with metformin and insulin.  She was diagnosed with shingles above her left eye immediately prior to starting chemotherapy. Between 05/15/16 (day 1 cycle 1) and 06/26/16 (day 1 cycle 3) she has received 3 doses of neoadjuvant carboplatin and paclitaxel. Her CA 125 was 7149 on 05/01/16 and increased to 11,113 on 05/16/16 on day of chemotherapy initiation. It was increased to 12,359 prior to cycle 2 and decreased to 8,811 for day 1 of cycle 3.  Repeat CT scan after 3 cycles showed minimal improvement. Given her modest CA 125 and CT response to 3 cycles of chemotherapy, she was felt to not be a good candidate for interval debulking at that time.  She received an additional 3 cycles of carboplatin and paclitaxel (last dose on 08/28/16). She has been tolerating chemotherapy well though does have issues with anemia and has required 2 blood transfusions. CA 125 on day 1 of cycle 6 (08/28/16) was 1,835 (which is a steady reduction over the past 3 cycles).  CT imaging on 09/11/16 showed No significant change omental soft tissue caking, consistent with metastatic disease. Mild ascites is decreased since previous study. Increased calcification along peritoneal surface in pelvic cul-de-sac, consistent with treated peritoneal metastatic disease. Stable 4.5cm homogeneous right pelvic mass, which favors a uterine fibroid although right ovarian neoplasm cannot definitely be excluded.  No new or progressive metastatic disease identified. No evidence of metastatic disease within the thorax.Marland Kitchen  She went on to complete 7 cycles of adjuvant chemotherapy with carboplatin and paclitaxel (last  dose on 09/18/16) with CA 125 1445.  Interval History:  On 10/14/16 she underwent robotic assisted total hysterectomy, BSO, minilaparotomy for omentectomy,  radical tumor debulking to R1 residual disease (milliary disease throughout peritoneal cavity).  Surgical findings were significant for bulky omental caking, small volume disease in pelvis.  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  Postoperatively she did very well with no issues or infection.   Current Meds:  Outpatient Encounter Prescriptions as of 10/29/2016  Medication Sig  . atorvastatin (LIPITOR) 40 MG tablet TAKE 1 TABLET BY MOUTH EVERY DAY  . benazepril (LOTENSIN) 10 MG tablet TAKE 1 TABLET BY MOUTH EVERY DAY  . CARBOPLATIN IV Inject into the vein. Every 21 days  . dexamethasone (DECADRON) 4 MG tablet The day before chemo take 5 tabs (20mg  total) in the am and 5 tabs (20mg  total) in the pm. The morning of chemo take 5 tabs (20mg  total).  . diazepam (VALIUM) 5 MG tablet Take 0.5 tablets (2.5 mg total) by mouth every 4 (four) hours as needed (spasms).  . enoxaparin (LOVENOX) 40 MG/0.4ML injection Inject 0.4 mLs (40 mg total) into the skin daily.  . furosemide (LASIX) 20 MG tablet Take 1 tablet (20 mg total) by mouth daily.  Marland Kitchen glucagon (GLUCAGON EMERGENCY) 1 MG injection Inject as directed as needed for low blood glucose if unresponsive.  Marland Kitchen glucosamine-chondroitin 500-400 MG tablet Take 1 tablet by mouth 2 (two) times daily.  Marland Kitchen glucose blood (ONE TOUCH ULTRA TEST) test strip USE TO CHECK BLOOD SUGAR UP TO 5 TIMES A DAY  . HYDROcodone-acetaminophen (NORCO) 10-325 MG tablet Take 1 tablet by mouth every 4 (four) hours as needed.  Marland Kitchen ibuprofen (ADVIL,MOTRIN) 800 MG tablet Take 1 tablet (800 mg total) by mouth every 8 (eight) hours as needed (mild pain).  . Insulin Glargine (BASAGLAR KWIKPEN) 100 UNIT/ML SOPN Inject 55 Units into the skin daily as needed (if unable to use insulin pump).   . lidocaine-prilocaine (EMLA) cream Apply a quarter size amount to port site 1 hour prior to chemo. Do not rub in. Cover with plastic wrap.  . metFORMIN (GLUCOPHAGE) 1000 MG  tablet TAKE 1 TABLET BY MOUTH TWICE DAILY WITH A MEAL  . Multiple Vitamin (MULTIVITAMIN WITH MINERALS) TABS Take 1 tablet by mouth daily.  . multivitamin-lutein (OCUVITE-LUTEIN) CAPS capsule Take 1 capsule by mouth daily.  Marland Kitchen NOVOLOG 100 UNIT/ML injection USE IN INSULIN PUMP AS DIRECTED (MAX DAILY DOSE IS 110 UNITS PER DAY)  . omeprazole (PRILOSEC) 40 MG capsule TAKE 1 CAPSULE(40 MG) BY MOUTH DAILY  . ondansetron (ZOFRAN) 8 MG tablet Take 1 tablet (8 mg total) by mouth every 8 (eight) hours as needed for nausea or vomiting.  Marland Kitchen PACLitaxel (TAXOL IV) Inject into the vein. Every 21 days  . polyethylene glycol (MIRALAX / GLYCOLAX) packet Take 17 g by mouth daily.  . valACYclovir (VALTREX) 500 MG tablet TAKE 1 TABLET(500 MG) BY MOUTH TWICE DAILY. START AFTER COMPLETION OF 1000 MG BY MOUTH 3 TIMES DAILY  . vitamin E (VITAMIN E) 400 UNIT capsule Take 400 Units by mouth daily.  Marland Kitchen aspirin 81 MG tablet Take 1 tablet (81 mg total) by mouth daily. (Patient not taking: Reported on 10/29/2016)  . senna (SENOKOT) 8.6 MG TABS tablet Take 1 tablet (8.6 mg total) by mouth at bedtime as needed for moderate constipation.  . [DISCONTINUED] cephALEXin (KEFLEX) 500 MG capsule Take 2 capsules (1,000 mg total) by mouth 2 (two) times daily. (Patient not taking: Reported  on 10/29/2016)  . [DISCONTINUED] ondansetron (ZOFRAN) 4 MG tablet Take 1 tablet (4 mg total) by mouth every 6 (six) hours as needed for nausea. (Patient not taking: Reported on 10/29/2016)  . [DISCONTINUED] sulfamethoxazole-trimethoprim (BACTRIM DS,SEPTRA DS) 800-160 MG tablet Take 1 tablet by mouth 2 (two) times daily. (Patient not taking: Reported on 10/29/2016)  . [DISCONTINUED] traMADol (ULTRAM) 50 MG tablet Take 2 tablets (100 mg total) by mouth every 6 (six) hours as needed for moderate pain. (Patient not taking: Reported on 10/29/2016)   No facility-administered encounter medications on file as of 10/29/2016.     Allergy:  Allergies  Allergen  Reactions  . Avandia [Rosiglitazone] Other (See Comments)    Legs swelled  . Micronase [Glyburide] Swelling  . Actos [Pioglitazone] Other (See Comments)    Edema / leg swelling    Social Hx:   Social History   Social History  . Marital status: Married    Spouse name: Gershon Mussel  . Number of children: 2  . Years of education: N/A   Occupational History  . Not on file.   Social History Main Topics  . Smoking status: Never Smoker  . Smokeless tobacco: Never Used  . Alcohol use Not on file  . Drug use: Unknown  . Sexual activity: Not on file   Other Topics Concern  . Not on file   Social History Narrative  . No narrative on file    Past Surgical Hx:  Past Surgical History:  Procedure Laterality Date  . CESAREAN SECTION    . DEBULKING N/A 10/14/2016   Procedure: DEBULKING;  Surgeon: Everitt Amber, MD;  Location: [facility];  Service: Gynecology;  Laterality: N/A;  . LAPAROTOMY WITH STAGING N/A 10/14/2016   Procedure: LAPAROTOMY WITH OMENTECTOMY AND TUMOR DEBULGING;  Surgeon: Everitt Amber, MD;  Location: [facility];  Service: Gynecology;  Laterality: N/A;  . LUMBAR FUSION  08/21/15   L3-L4 Dr. Timmothy Euler  . OMENTECTOMY N/A 10/14/2016   Procedure: OMENTECTOMY;  Surgeon: Everitt Amber, MD;  Location: [facility];  Service: Gynecology;  Laterality: N/A;  . ROBOTIC ASSISTED TOTAL HYSTERECTOMY WITH BILATERAL SALPINGO OOPHERECTOMY Bilateral 10/14/2016   Procedure: XI ROBOTIC ASSISTED TOTAL LAPARSCOPIC  HYSTERECTOMY WITH BILATERAL SALPINGO OOPHORECTOMY;  Surgeon: Everitt Amber, MD;  Location: [facility];  Service: Gynecology;  Laterality: Bilateral;    Past Medical Hx:  Past Medical History:  Diagnosis Date  . Abnormal CT of the abdomen 04/30/2016  . Diabetes mellitus without complication ([place])   . Dysrhythmia   . Family history of breast cancer   . GERD (gastroesophageal reflux disease)   . History of blood transfusion   . History of bronchitis   . History of chemotherapy   . History of urinary tract  infection   . Hyperlipidemia   . Hypertension   . Low serum vitamin D   . Ovarian cancer ([place]) 05/09/2016  . Shingles     Past Gynecological History:  No LMP recorded. Patient is postmenopausal.  Family Hx:  Family History  Problem Relation Age of Onset  . Lung cancer Mother     smoker; dx in her 14s  . Leukemia Father   . Diabetes Paternal Grandmother   . Heart attack Paternal Grandmother   . Diabetes Paternal Grandfather   . Breast cancer Paternal Aunt     dx in her 1s-30s  . Leukemia Paternal Uncle   . Heart attack Maternal Grandfather   . Breast cancer Cousin     maternal first cousin  Review of Systems:  Constitutional  Feels fatigued  ENT Normal appearing ears and nares bilaterally Skin/Breast  resolved right temporal and eyebrow vesicular rash Cardiovascular  No chest pain, shortness of breath, or edema  Pulmonary  No cough or wheeze.  Gastro Intestinal  improved bloating, nausea, early satiety Genito Urinary  No frequency, urgency, dysuria, no bleeding Musculo Skeletal  No myalgia, arthralgia, joint swelling or pain  Neurologic  No weakness, numbness, change in gait,  Psychology  No depression, anxiety, insomnia.   Vitals:  Blood pressure (!) 139/48, pulse 92, temperature 98.1 F (36.7 C), temperature source Oral, resp. rate 16, height 5\' 3"  (1.6 m), weight 200 lb 11.2 oz (91 kg), SpO2 100 %.  Physical Exam: WD in NAD Neck  Supple NROM, without any enlargements.  Lymph Node Survey No cervical supraclavicular or inguinal adenopathy Cardiovascular  Pulse normal rate, regularity and rhythm. S1 and S2 normal.  Lungs  Clear to auscultation bilateraly, without wheezes/crackles/rhonchi. Good air movement.  Skin  No rash/lesions/breakdown  Psychiatry  Alert and oriented to person, place, and time  Abdomen  Normoactive bowel sounds, abdomen soft, non-tender and obese without evidence of hernia. Incision is well healed in addition to laparoscopic  sites Back No CVA tenderness Genito Urinary: surgically absent uterus and cervix. Vaginal cuff in tact and not bleeding. Rectal  deferred Extremities  No bilateral cyanosis, clubbing or edema.   30 minutes of direct face to face counseling time was spent with the patient. This included discussion about prognosis, therapy recommendations and postoperative side effects and are beyond the scope of routine postoperative care.  Donaciano Eva, MD  10/29/2016, 4:49 PM

## 2016-10-29 ENCOUNTER — Encounter: Payer: Self-pay | Admitting: Gynecologic Oncology

## 2016-10-29 ENCOUNTER — Ambulatory Visit: Payer: BC Managed Care – PPO | Attending: Gynecologic Oncology | Admitting: Gynecologic Oncology

## 2016-10-29 ENCOUNTER — Ambulatory Visit (HOSPITAL_COMMUNITY): Payer: BC Managed Care – PPO | Admitting: Hematology & Oncology

## 2016-10-29 VITALS — BP 139/48 | HR 92 | Temp 98.1°F | Resp 16 | Ht 63.0 in | Wt 200.7 lb

## 2016-10-29 DIAGNOSIS — Z7982 Long term (current) use of aspirin: Secondary | ICD-10-CM | POA: Insufficient documentation

## 2016-10-29 DIAGNOSIS — Z9889 Other specified postprocedural states: Secondary | ICD-10-CM | POA: Diagnosis not present

## 2016-10-29 DIAGNOSIS — Z90710 Acquired absence of both cervix and uterus: Secondary | ICD-10-CM | POA: Diagnosis not present

## 2016-10-29 DIAGNOSIS — Z90722 Acquired absence of ovaries, bilateral: Secondary | ICD-10-CM | POA: Insufficient documentation

## 2016-10-29 DIAGNOSIS — D649 Anemia, unspecified: Secondary | ICD-10-CM | POA: Insufficient documentation

## 2016-10-29 DIAGNOSIS — C5700 Malignant neoplasm of unspecified fallopian tube: Secondary | ICD-10-CM | POA: Diagnosis not present

## 2016-10-29 DIAGNOSIS — E785 Hyperlipidemia, unspecified: Secondary | ICD-10-CM | POA: Insufficient documentation

## 2016-10-29 DIAGNOSIS — I10 Essential (primary) hypertension: Secondary | ICD-10-CM | POA: Diagnosis not present

## 2016-10-29 DIAGNOSIS — K219 Gastro-esophageal reflux disease without esophagitis: Secondary | ICD-10-CM | POA: Diagnosis not present

## 2016-10-29 DIAGNOSIS — Z9079 Acquired absence of other genital organ(s): Secondary | ICD-10-CM | POA: Diagnosis not present

## 2016-10-29 DIAGNOSIS — C569 Malignant neoplasm of unspecified ovary: Secondary | ICD-10-CM | POA: Insufficient documentation

## 2016-10-29 DIAGNOSIS — Z794 Long term (current) use of insulin: Secondary | ICD-10-CM | POA: Diagnosis not present

## 2016-10-29 DIAGNOSIS — R188 Other ascites: Secondary | ICD-10-CM | POA: Insufficient documentation

## 2016-10-29 DIAGNOSIS — Z7189 Other specified counseling: Secondary | ICD-10-CM | POA: Diagnosis not present

## 2016-10-29 DIAGNOSIS — E119 Type 2 diabetes mellitus without complications: Secondary | ICD-10-CM | POA: Diagnosis not present

## 2016-10-29 NOTE — Patient Instructions (Signed)
Plan to follow up with [name] K. PA and Dr. Whitney Muse.  We will see you in the office after two to three cycles.  Please call for any questions or concerns.

## 2016-10-30 ENCOUNTER — Encounter (HOSPITAL_COMMUNITY): Payer: Self-pay | Admitting: Oncology

## 2016-10-30 ENCOUNTER — Encounter (HOSPITAL_BASED_OUTPATIENT_CLINIC_OR_DEPARTMENT_OTHER): Payer: BC Managed Care – PPO | Admitting: Oncology

## 2016-10-30 VITALS — BP 130/51 | HR 92 | Temp 98.0°F | Resp 16 | Wt 200.1 lb

## 2016-10-30 DIAGNOSIS — R935 Abnormal findings on diagnostic imaging of other abdominal regions, including retroperitoneum: Secondary | ICD-10-CM | POA: Diagnosis not present

## 2016-10-30 DIAGNOSIS — C569 Malignant neoplasm of unspecified ovary: Secondary | ICD-10-CM | POA: Diagnosis not present

## 2016-10-30 DIAGNOSIS — K219 Gastro-esophageal reflux disease without esophagitis: Secondary | ICD-10-CM | POA: Diagnosis not present

## 2016-10-30 DIAGNOSIS — N3000 Acute cystitis without hematuria: Secondary | ICD-10-CM

## 2016-10-30 DIAGNOSIS — R350 Frequency of micturition: Secondary | ICD-10-CM

## 2016-10-30 DIAGNOSIS — C481 Malignant neoplasm of specified parts of peritoneum: Secondary | ICD-10-CM

## 2016-10-30 LAB — URINALYSIS, ROUTINE W REFLEX MICROSCOPIC
BILIRUBIN URINE: NEGATIVE
Glucose, UA: NEGATIVE mg/dL
Ketones, ur: NEGATIVE mg/dL
NITRITE: POSITIVE — AB
PH: 5 (ref 5.0–8.0)
Protein, ur: NEGATIVE mg/dL
SPECIFIC GRAVITY, URINE: 1.019 (ref 1.005–1.030)

## 2016-10-30 MED ORDER — OMEPRAZOLE 40 MG PO CPDR: DELAYED_RELEASE_CAPSULE | ORAL | 5 refills | Status: DC

## 2016-10-30 MED ORDER — HYDROCODONE-ACETAMINOPHEN 10-325 MG PO TABS: 1.0000 | ORAL_TABLET | ORAL | 0 refills | Status: DC | PRN

## 2016-10-30 MED ORDER — DIAZEPAM 5 MG PO TABS: 2.5000 mg | ORAL_TABLET | ORAL | 1 refills | Status: DC | PRN

## 2016-10-30 NOTE — Addendum Note (Signed)
Addended by: Jaynie Collins R on: 10/30/2016 11:02 AM   Modules accepted: Orders

## 2016-10-30 NOTE — Assessment & Plan Note (Addendum)
Stage IIIC primary peritoneal carcinomatosis with slow but progressive response to neoadjuvant chemotherapy consisting of Carboplatin/Paclitaxel x 7 cycles (05/15/2016- 09/18/2016) followed by optimal cytoreductive surgery by Dr. Denman George on 10/14/2016 with R1 residual disease.  Oncology history updated.  Pathologic staging in [facility] problem list completed.  I have reviewed the patient's chart paying particular attention to Dr. Serita Grit Op Note, Pathology Report, and Dr. Serita Grit post-surgical follow-up note.  Her recommendations are noted: I am recommending at least 3 additional cycles of adjuvant chemotherapy with carboplatin and paclitaxel. I would recommend repeat imaging and CA 125 at that time. If she is disease free, therapy can stop at that time, however, if disease persists, she will require ongoing therapy. I discussed with Trinyti that due to her modest and slow response to primary therapy, prognosis is poorer, and she has a high probability for developing platinum resistance.  We will plan on re-initiating systemic chemotherapy after the New Year as recommended.  As mentioned, there is concern for platinum resistant disease.  Following 3 more cycles of Carboplatin/paclitaxel, we will perform CA 125 and repeat CT imaging for restaging purposes.  Will plan on restarting on 11/06/2016.  She requests refills on a few medications and these are addressed today: Hydrocodone, Valium, and Prilosec.  The latter is escribed, the former two are printed Rxs and given to the patient.  She requests a UA as Dr. Denman George wanted one at her last visit, but the patient was unable to provide a specimen.  Order is placed for UA with reflex and culture (if indicated).  When she completed her course of Lovenox, she can restart her ASA.  Return next week for restart of chemotherapy (11/06/2016- patient's request).  Three cycles of additional Carboplatin/Paclitaxel are built.  Return in 4 weeks for cycle #9 of chemotherapy  and follow-up appointment.  Addendum: UA did demonstrate findings concerning for UTI.  Gram Negative Rods were noted and therefore, I will escribe Macrobid x 7 days to her pharmacy.

## 2016-10-30 NOTE — Patient Instructions (Addendum)
[place] at [facility] Discharge Instructions  RECOMMENDATIONS MADE BY THE CONSULTANT AND ANY TEST RESULTS WILL BE SENT TO YOUR REFERRING PHYSICIAN.  You saw [name], PA-C, today. Restart Aspirin when finished with Lovenox. Restart chemo on 11/06/16. Follow up on 11/27/16 to see MD and chemo. See Amy at checkout for appointments.  Thank you for choosing [place] at [facility] to provide your oncology and hematology care.  To afford each patient quality time with our provider, please arrive at least 15 minutes before your scheduled appointment time.    If you have a lab appointment with the [facility] please come in thru the  Main Entrance and check in at the main information desk  You need to re-schedule your appointment should you arrive 10 or more minutes late.  We strive to give you quality time with our providers, and arriving late affects you and other patients whose appointments are after yours.  Also, if you no show three or more times for appointments you may be dismissed from the clinic at the providers discretion.     Again, thank you for choosing [facility].  Our hope is that these requests will decrease the amount of time that you wait before being seen by our physicians.       _____________________________________________________________  Should you have questions after your visit to [facility], please contact our office at [phone] between the hours of 8:30 a.m. and 4:30 p.m.  Voicemails left after 4:30 p.m. will not be returned until the following business day.  For prescription refill requests, have your pharmacy contact our office.       Resources For Cancer Patients and their Caregivers ? American Cancer Society: Can assist with transportation, wigs, general needs, runs Look Good Feel Better.        (208)054-8283 ? Cancer Care: Provides financial assistance, online support  groups, medication/co-pay assistance.  1-800-813-HOPE 5410170851) ? [place] Assists [place] cancer patients and their families through emotional , educational and financial support.  705-622-2689 ? [place] DSS Where to apply for food stamps, Medicaid and utility assistance. 380-580-0601 ? [facility]: Transportation to medical appointments. 8137621790 ? Social Security Administration: May apply for disability if have a Stage IV cancer. 838-512-6057 815-163-8201 ? [employer], Disability and Transit Services: Assists with nutrition, care and transit needs. [place] Support Programs: @10RELATIVEDAYS @ > Cancer Support Group  2nd Tuesday of the month 1pm-2pm, Journey Room  > Creative Journey  3rd Tuesday of the month 1130am-1pm, Journey Room  > Look Good Feel Better  1st Wednesday of the month 10am-12 noon, Journey Room (Call [place] to register [phone])

## 2016-10-30 NOTE — Progress Notes (Addendum)
[name], MD [place] [address]  Extraovarian primary peritoneal carcinoma [facility])  Ovarian cancer, unspecified laterality ([place]) - Plan: diazepam (VALIUM) 5 MG tablet, HYDROcodone-acetaminophen (NORCO) 10-325 MG tablet  Gastroesophageal reflux disease, esophagitis presence not specified - Plan: omeprazole (PRILOSEC) 40 MG capsule  Urinary frequency - Plan: Urinalysis, Routine w reflex microscopic, Urine culture, Urinalysis, Routine w reflex microscopic, Urine culture  Acute cystitis without hematuria - Plan: nitrofurantoin, macrocrystal-monohydrate, (MACROBID) 100 MG capsule  CURRENT THERAPY: S/P optimal cytoreduction with hysterectomy, BSO, omentectomy on 10/14/2016 with R1 residual disease after undergoing 7 cycles of chemotherapy (Carboplatin/Paclitaxel).  INTERVAL HISTORY: Mitze Deshmukh 65 y.o. 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 Denman George on 10/14/2016 with R1 residual disease.    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[place])   04/29/2016 Imaging    CT abd/pelvis- Extensive omental caking as well as moderate amount of ascites within the abdomen most compatible with peritoneal metastatic disease, of unknown primary. This may potentially be ovarian or a GI in etiology.      04/30/2016 Tumor Marker    CA 125- 7149.0 (H)      05/01/2016 Procedure    US paracentesis- Successful ultrasound-guided paracentesis yielding 1.8 liters of peritoneal fluid.      05/01/2016 Imaging    US pelvis- Both transabdominal and transvaginal sonography are significantly limited by large patient habitus and ascites. Neither uterus or ovaries were visualized on this exam.      05/02/2016 Pathology Results    PERITONEAL/ASCITIC FLUID(SPECIMEN 1 OF 1  COLLECTED 05/01/16): MALIGNANT CELLS CONSISTENT WITH METASTATIC HIGH GRADE SEROUS CARCINOMA.      05/08/2016 Imaging    CT chest- No evidence of metastatic disease in the chest. Peritoneal/omental disease with abdominal ascites in the upper abdomen, incompletely visualized.       05/13/2016 Procedure    Placement of single lumen port a cath via right internal jugular vein. The catheter tip lies at the cavoatrial junction. A power injectable port a cath was placed and is ready for immediate use.      05/15/2016 Procedure    US Paracentesis- 3400 ml yellow colored ascites removed      05/15/2016 - 09/18/2016 Chemotherapy    Carboplatin/Paclitaxel every 21 days x 7 cycles      07/01/2016 Miscellaneous    Genetic Counseling by [name]-  Genetic testing was normal, and did not reveal a deleterious mutation in these genes.       07/08/2016 Imaging    CT CAP- 1. Small volume ascites, significantly decreased. 2. Stable diffuse omental soft tissue caking and diffuse peritoneal thickening along the bilateral paracolic gutters and bilateral pelvic peritoneal reflections, consistent with peritoneal carcinomatosis. 3. Stable asymmetrically enlarged right ovary, which may represent the primary site of ovarian malignancy. 4. No evidence of metastatic disease in the chest. No new sites of metastatic disease in the abdomen or pelvis.      07/09/2016 Miscellaneous    Gyn Onc re-evaluation- modest response to therapy, 3 more cycles of chemotherapy recommended.        09/11/2016 Imaging    CT C/A/P No significant change omental soft tissue caking, consistent with metastatic disease. Mild ascites is decreased since previous study.  Increased calcification along peritoneal surface in pelvic cul-de-sac, consistent with treated peritoneal metastatic disease.  Stable  4.5cm homogeneous right pelvic mass, which favors a uterine fibroid although right ovarian neoplasm cannot definitely  be excluded.  No new or progressive metastatic disease identified. No evidence of metastatic disease within the thorax.       10/14/2016 Procedure    Robotic-assisted laparoscopic total hysterectomy with bilateral salpingoophorectomy, ex lap omentectomy, radical tumor debulking by Dr. Denman George      10/17/2016 Pathology Results    Diagnosis 1. 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 - CERVIX AND ENDOMETRIUM FREE OF TUMOR. - SEE ONCOLOGY TABLE AND COMMENT. 2. Soft tissue, biopsy, umbilical nodule - HIGH GRADE SEROUS CARCINOMA. 3. Omentum, resection for tumor - HIGH GRADE SEROUS CARCINOMA, 33 CM.       Chart is reviewed and Dr. Serita Grit Op Note, surgical pathology, and follow-up note are reviewed in detail:   Ms. Amarelis Scheiman  is a 65 y.o.  year old with stage IIIC ovarian/primary peritoneal/fallopian tube cancer with slow but progressive response to neoadjuvant chemotherapy with carboplatin and paclitaxel. S/p 7 cycles adjuvant chemotherapy with carb/taxol. S/p interval optimal cytoreduction with hysterectomy, BSO, omentectomy on 10/14/16 with R1 residual disease. Healing well postoperatively with no issues. She is cleared to resume chemotherapy. I am recommending at least 3 additional cycles of adjuvant chemotherapy with carboplatin and paclitaxel. I would recommend repeat imaging and CA 125 at that time. If she is disease free, therapy can stop at that time, however, if disease persists, she will require ongoing therapy. I discussed with Salina that due to her modest and slow response to primary therapy, prognosis is poorer, and she has a high probability for developing platinum resistance.  She is doing well and healing nicely post-operatively.  She has not experienced any post-operative complications.  She confirms the information that I have read from Dr. Serita Grit note and  therefore validates that the patient knows the future plan moving forward.  She was mildly concerned about not having CA125 checked with each cycle of chemotherapy.  Review of Systems  Constitutional: Negative.  Negative for chills, fever and weight loss.  HENT: Negative.   Eyes: Negative.   Respiratory: Negative.  Negative for cough.   Cardiovascular: Negative.  Negative for chest pain.  Gastrointestinal: Negative.  Negative for abdominal pain, constipation, diarrhea, nausea and vomiting.  Genitourinary: Negative.   Musculoskeletal: Negative.   Skin: Negative.   Neurological: Negative.  Negative for weakness.  Endo/Heme/Allergies: Negative.   Psychiatric/Behavioral: Negative.     Past Medical History:  Diagnosis Date  . Abnormal CT of the abdomen 04/30/2016  . Diabetes mellitus without complication ([place])   . Dysrhythmia   . 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[place]) 05/09/2016  . Family history of breast cancer   . GERD (gastroesophageal reflux disease)   . History of blood transfusion   . History of bronchitis   . History of chemotherapy   . History of urinary tract infection   . Hyperlipidemia   . Hypertension   . Low serum vitamin D   . Ovarian cancer ([place]) 05/09/2016  . Shingles     Past Surgical History:  Procedure Laterality Date  . CESAREAN SECTION    . DEBULKING N/A 10/14/2016   Procedure: DEBULKING;  Surgeon: Everitt Amber, MD;  Location: [facility];  Service: Gynecology;  Laterality: N/A;  . LAPAROTOMY WITH STAGING N/A 10/14/2016   Procedure: LAPAROTOMY WITH OMENTECTOMY AND TUMOR DEBULGING;  Surgeon: Everitt Amber, MD;  Location: [facility];  Service:  Gynecology;  Laterality: N/A;  . LUMBAR FUSION  08/21/15   L3-L4 Dr. Timmothy Euler  . OMENTECTOMY N/A 10/14/2016   Procedure: OMENTECTOMY;  Surgeon: Everitt Amber, MD;  Location: [facility];  Service: Gynecology;  Laterality: N/A;  . ROBOTIC ASSISTED TOTAL HYSTERECTOMY WITH BILATERAL SALPINGO OOPHERECTOMY Bilateral 10/14/2016    Procedure: XI ROBOTIC ASSISTED TOTAL LAPARSCOPIC  HYSTERECTOMY WITH BILATERAL SALPINGO OOPHORECTOMY;  Surgeon: Everitt Amber, MD;  Location: [facility];  Service: Gynecology;  Laterality: Bilateral;    Family History  Problem Relation Age of Onset  . Lung cancer Mother     smoker; dx in her 42s  . Leukemia Father   . Diabetes Paternal Grandmother   . Heart attack Paternal Grandmother   . Diabetes Paternal Grandfather   . Breast cancer Paternal Aunt     dx in her 68s-30s  . Leukemia Paternal Uncle   . Heart attack Maternal Grandfather   . Breast cancer Cousin     maternal first cousin    Social History   Social History  . Marital status: Married    Spouse name: Gershon Mussel  . Number of children: 2  . Years of education: N/A   Social History Main Topics  . Smoking status: Never Smoker  . Smokeless tobacco: Never Used  . Alcohol use No  . Drug use: No  . Sexual activity: Not Asked     Comment: married   Other Topics Concern  . None   Social History Narrative  . None     PHYSICAL EXAMINATION  ECOG PERFORMANCE STATUS: 1 - Symptomatic but completely ambulatory  Vitals:   10/30/16 1024  BP: (!) 130/51  Pulse: 92  Resp: 16  Temp: 98 F (36.7 C)    GENERAL:alert, no distress, well nourished, well developed, comfortable, cooperative, obese, smiling and accompanied by husband, cranial prosthesis in place. SKIN: skin color, texture, turgor are normal, no rashes or significant lesions HEAD: Normocephalic, No masses, lesions, tenderness or abnormalities EYES: normal, EOMI, Conjunctiva are pink and non-injected EARS: External ears normal OROPHARYNX:lips, buccal mucosa, and tongue normal and mucous membranes are moist  NECK: supple, trachea midline LYMPH:  not examined BREAST:not examined LUNGS: clear to auscultation  HEART: regular rate & rhythm, no murmurs and no gallops ABDOMEN:abdomen soft, non-tender, obese, normal bowel sounds and surgical site healing nicely with Band-Aid in  place on the most superior portion of midline surgical scar without drainage and port sites nearly completely healed. BACK: Back symmetric, no curvature. EXTREMITIES:less then 2 second capillary refill, no joint deformities, effusion, or inflammation, no skin discoloration, no cyanosis  NEURO: alert & oriented x 3 with fluent speech, no focal motor/sensory deficits, gait normal   LABORATORY DATA: CBC    Component Value Date/Time   WBC 5.2 10/22/2016 0917   RBC 2.32 (L) 10/22/2016 0917   HGB 8.1 (L) 10/22/2016 0917   HCT 24.2 (L) 10/22/2016 0917   PLT 182 10/22/2016 0917   MCV 104.3 (H) 10/22/2016 0917   MCV 87.0 12/21/2013 1200   MCH 34.9 (H) 10/22/2016 0917   MCHC 33.5 10/22/2016 0917   RDW 20.6 (H) 10/22/2016 0917   LYMPHSABS 0.5 (L) 10/22/2016 0917   MONOABS 0.4 10/22/2016 0917   EOSABS 0.3 10/22/2016 0917   BASOSABS 0.0 10/22/2016 0917      Chemistry      Component Value Date/Time   NA 137 10/15/2016 0435   NA 139 02/06/2016 1148   K 4.7 10/15/2016 0435   CL 105 10/15/2016 0435  CO2 26 10/15/2016 0435   BUN 17 10/15/2016 0435   BUN 21 02/06/2016 1148   CREATININE 0.87 10/15/2016 0435      Component Value Date/Time   CALCIUM 8.8 (L) 10/15/2016 0435   ALKPHOS 92 10/09/2016 1415   AST 21 10/09/2016 1415   ALT 16 10/09/2016 1415   BILITOT 0.4 10/09/2016 1415   BILITOT 0.3 07/02/2015 1005      Lab Results  Component Value Date   CA125 1,445.0 (H) 09/18/2016     PENDING LABS:   RADIOGRAPHIC STUDIES:  No results found.   PATHOLOGY:    ASSESSMENT AND PLAN:  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[place]) Stage IIIC primary peritoneal carcinomatosis with slow but progressive response to neoadjuvant chemotherapy consisting of Carboplatin/Paclitaxel x 7 cycles (05/15/2016- 09/18/2016) followed by optimal cytoreductive surgery by Dr. Denman George on 10/14/2016 with R1 residual disease.  Oncology history updated.  Pathologic staging in [facility] problem list  completed.  I have reviewed the patient's chart paying particular attention to Dr. Serita Grit Op Note, Pathology Report, and Dr. Serita Grit post-surgical follow-up note.  Her recommendations are noted: I am recommending at least 3 additional cycles of adjuvant chemotherapy with carboplatin and paclitaxel. I would recommend repeat imaging and CA 125 at that time. If she is disease free, therapy can stop at that time, however, if disease persists, she will require ongoing therapy. I discussed with Taniqua that due to her modest and slow response to primary therapy, prognosis is poorer, and she has a high probability for developing platinum resistance.  We will plan on re-initiating systemic chemotherapy after the New Year as recommended.  As mentioned, there is concern for platinum resistant disease.  Following 3 more cycles of Carboplatin/paclitaxel, we will perform CA 125 and repeat CT imaging for restaging purposes.  Will plan on restarting on 11/06/2016.  She requests refills on a few medications and these are addressed today: Hydrocodone, Valium, and Prilosec.  The latter is escribed, the former two are printed Rxs and given to the patient.  She requests a UA as Dr. Denman George wanted one at her last visit, but the patient was unable to provide a specimen.  Order is placed for UA with reflex and culture (if indicated).  When she completed her course of Lovenox, she can restart her ASA.  Return next week for restart of chemotherapy (11/06/2016- patient's request).  Three cycles of additional Carboplatin/Paclitaxel are built.  Return in 4 weeks for cycle #9 of chemotherapy and follow-up appointment.  Addendum: UA did demonstrate findings concerning for UTI.  Gram Negative Rods were noted and therefore, I will escribe Macrobid x 7 days to her pharmacy.   ORDERS PLACED FOR THIS ENCOUNTER: Orders Placed This Encounter  Procedures  . Urine culture  . Urinalysis, Routine w reflex microscopic     MEDICATIONS PRESCRIBED THIS ENCOUNTER: Meds ordered this encounter  Medications  . diazepam (VALIUM) 5 MG tablet    Sig: Take 0.5 tablets (2.5 mg total) by mouth every 4 (four) hours as needed (spasms).    Dispense:  30 tablet    Refill:  1    Order Specific Question:   Supervising Provider    Answer:   Patrici Ranks [id number]  . HYDROcodone-acetaminophen (NORCO) 10-325 MG tablet    Sig: Take 1 tablet by mouth every 4 (four) hours as needed.    Dispense:  90 tablet    Refill:  0    Order Specific Question:   Supervising Provider    Answer:  Ancil Linsey K [id number]  . omeprazole (PRILOSEC) 40 MG capsule    Sig: TAKE 1 CAPSULE(40 MG) BY MOUTH DAILY    Dispense:  30 capsule    Refill:  5    Order Specific Question:   Supervising Provider    Answer:   Patrici Ranks [id number]  . nitrofurantoin, macrocrystal-monohydrate, (MACROBID) 100 MG capsule    Sig: Take 1 capsule (100 mg total) by mouth 2 (two) times daily.    Dispense:  14 capsule    Refill:  0    Order Specific Question:   Supervising Provider    Answer:   Patrici Ranks [id number]    THERAPY PLAN:  3 more cycles of Carboplatin/Paclitaxel (Cycle #10) followed by restaging imaging.  If disease-free, then discontinuation of therapy is recommended, however, if disease persists, ongoing treatment will be recommended.   All questions were answered. The patient knows to call the clinic with any problems, questions or concerns. We can certainly see the patient much sooner if necessary.  Patient and plan discussed with Dr. Ancil Linsey and she is in agreement with the aforementioned.   This note is electronically signed by: Doy Mince 10/31/2016 3:51 PM

## 2016-10-31 ENCOUNTER — Other Ambulatory Visit (HOSPITAL_COMMUNITY): Payer: Self-pay | Admitting: Emergency Medicine

## 2016-10-31 MED ORDER — NITROFURANTOIN MONOHYD MACRO 100 MG PO CAPS: 100.0000 mg | ORAL_CAPSULE | Freq: Two times a day (BID) | ORAL | 0 refills | Status: DC

## 2016-10-31 NOTE — Addendum Note (Signed)
Addended by: Baird Cancer on: 10/31/2016 03:51 PM   Modules accepted: Orders

## 2016-11-01 LAB — URINE CULTURE: Culture: >=100000 — AB

## 2016-11-04 ENCOUNTER — Other Ambulatory Visit (HOSPITAL_COMMUNITY): Payer: Self-pay | Admitting: Hematology & Oncology

## 2016-11-04 ENCOUNTER — Other Ambulatory Visit (HOSPITAL_COMMUNITY): Payer: Self-pay | Admitting: Oncology

## 2016-11-04 DIAGNOSIS — N3000 Acute cystitis without hematuria: Secondary | ICD-10-CM

## 2016-11-04 MED ORDER — CIPROFLOXACIN HCL 250 MG PO TABS: 250.0000 mg | ORAL_TABLET | Freq: Two times a day (BID) | ORAL | 0 refills | Status: DC

## 2016-11-06 ENCOUNTER — Encounter (HOSPITAL_COMMUNITY): Payer: BC Managed Care – PPO | Attending: Oncology

## 2016-11-06 VITALS — BP 122/57 | HR 92 | Temp 98.0°F | Resp 18 | Wt 201.0 lb

## 2016-11-06 DIAGNOSIS — R935 Abnormal findings on diagnostic imaging of other abdominal regions, including retroperitoneum: Secondary | ICD-10-CM | POA: Diagnosis not present

## 2016-11-06 DIAGNOSIS — Z5111 Encounter for antineoplastic chemotherapy: Secondary | ICD-10-CM | POA: Diagnosis not present

## 2016-11-06 DIAGNOSIS — C569 Malignant neoplasm of unspecified ovary: Secondary | ICD-10-CM

## 2016-11-06 DIAGNOSIS — Z5189 Encounter for other specified aftercare: Secondary | ICD-10-CM

## 2016-11-06 DIAGNOSIS — C481 Malignant neoplasm of specified parts of peritoneum: Secondary | ICD-10-CM

## 2016-11-06 LAB — COMPREHENSIVE METABOLIC PANEL
ALT: 15 U/L (ref 14–54)
AST: 24 U/L (ref 15–41)
Albumin: 4.2 g/dL (ref 3.5–5.0)
Alkaline Phosphatase: 72 U/L (ref 38–126)
Anion gap: 12 (ref 5–15)
BUN: 31 mg/dL — ABNORMAL HIGH (ref 6–20)
CHLORIDE: 103 mmol/L (ref 101–111)
CO2: 21 mmol/L — AB (ref 22–32)
CREATININE: 1 mg/dL (ref 0.44–1.00)
Calcium: 9.8 mg/dL (ref 8.9–10.3)
GFR calc non Af Amer: 59 mL/min — ABNORMAL LOW (ref >60)
Glucose, Bld: 273 mg/dL — ABNORMAL HIGH (ref 65–99)
Potassium: 4 mmol/L (ref 3.5–5.1)
SODIUM: 136 mmol/L (ref 135–145)
Total Bilirubin: 0.5 mg/dL (ref 0.3–1.2)
Total Protein: 7.1 g/dL (ref 6.5–8.1)

## 2016-11-06 LAB — CBC WITH DIFFERENTIAL/PLATELET
Basophils Absolute: 0 10*3/uL (ref 0.0–0.1)
Basophils Relative: 0 %
EOS ABS: 0 10*3/uL (ref 0.0–0.7)
EOS PCT: 0 %
HCT: 31.1 % — ABNORMAL LOW (ref 36.0–46.0)
Hemoglobin: 10.5 g/dL — ABNORMAL LOW (ref 12.0–15.0)
LYMPHS ABS: 0.3 10*3/uL — AB (ref 0.7–4.0)
Lymphocytes Relative: 6 %
MCH: 34.7 pg — AB (ref 26.0–34.0)
MCHC: 33.8 g/dL (ref 30.0–36.0)
MCV: 102.6 fL — ABNORMAL HIGH (ref 78.0–100.0)
Monocytes Absolute: 0.1 10*3/uL (ref 0.1–1.0)
Monocytes Relative: 2 %
Neutro Abs: 5.1 10*3/uL (ref 1.7–7.7)
Neutrophils Relative %: 92 %
PLATELETS: 168 10*3/uL (ref 150–400)
RBC: 3.03 MIL/uL — AB (ref 3.87–5.11)
RDW: 18.8 % — ABNORMAL HIGH (ref 11.5–15.5)
WBC: 5.6 10*3/uL (ref 4.0–10.5)

## 2016-11-06 MED ORDER — EPINEPHRINE HCL 1 MG/ML IJ SOLN: 0.5000 mg | Freq: Once | INTRAMUSCULAR | Status: DC | PRN

## 2016-11-06 MED ORDER — EPINEPHRINE HCL 1 MG/ML IJ SOLN
0.5000 mg | Freq: Once | INTRAMUSCULAR | Status: DC | PRN
Start: 2016-11-06 — End: 2016-11-06

## 2016-11-06 MED ORDER — SODIUM CHLORIDE 0.9% FLUSH
10.0000 mL | INTRAVENOUS | Status: DC | PRN
  Administered 2016-11-06: 10 mL
  Filled 2016-11-06: qty 10

## 2016-11-06 MED ORDER — SODIUM CHLORIDE 0.9 % IV SOLN
20.0000 mg | Freq: Once | INTRAVENOUS | Status: AC
  Administered 2016-11-06: 20 mg via INTRAVENOUS
  Filled 2016-11-06: qty 2

## 2016-11-06 MED ORDER — EPINEPHRINE HCL 0.1 MG/ML IJ SOSY: 0.2500 mg | PREFILLED_SYRINGE | Freq: Once | INTRAMUSCULAR | Status: DC | PRN

## 2016-11-06 MED ORDER — SODIUM CHLORIDE 0.9 % IV SOLN: Freq: Once | INTRAVENOUS | Status: DC | PRN

## 2016-11-06 MED ORDER — DIPHENHYDRAMINE HCL 50 MG/ML IJ SOLN
50.0000 mg | Freq: Once | INTRAMUSCULAR | Status: AC
  Administered 2016-11-06: 50 mg via INTRAVENOUS
  Filled 2016-11-06: qty 1

## 2016-11-06 MED ORDER — COLD PACK MISC ONCOLOGY: 1.0000 | Freq: Once | Status: DC | PRN

## 2016-11-06 MED ORDER — HEPARIN SOD (PORK) LOCK FLUSH 100 UNIT/ML IV SOLN
500.0000 [IU] | Freq: Once | INTRAVENOUS | Status: AC | PRN
  Administered 2016-11-06: 500 [IU]
  Filled 2016-11-06: qty 5

## 2016-11-06 MED ORDER — METHYLPREDNISOLONE SODIUM SUCC 125 MG IJ SOLR: 125.0000 mg | Freq: Once | INTRAMUSCULAR | Status: DC | PRN

## 2016-11-06 MED ORDER — DIPHENHYDRAMINE HCL 50 MG/ML IJ SOLN: 50.0000 mg | Freq: Once | INTRAMUSCULAR | Status: DC | PRN

## 2016-11-06 MED ORDER — FAMOTIDINE IN NACL 20-0.9 MG/50ML-% IV SOLN
20.0000 mg | Freq: Once | INTRAVENOUS | Status: AC
  Administered 2016-11-06: 20 mg via INTRAVENOUS
  Filled 2016-11-06: qty 50

## 2016-11-06 MED ORDER — PACLITAXEL CHEMO INJECTION 300 MG/50ML
175.0000 mg/m2 | Freq: Once | INTRAVENOUS | Status: AC
  Administered 2016-11-06: 366 mg via INTRAVENOUS
  Filled 2016-11-06: qty 61

## 2016-11-06 MED ORDER — PEGFILGRASTIM 6 MG/0.6ML ~~LOC~~ PSKT
6.0000 mg | PREFILLED_SYRINGE | Freq: Once | SUBCUTANEOUS | Status: AC
  Administered 2016-11-06: 6 mg via SUBCUTANEOUS
  Filled 2016-11-06: qty 0.6

## 2016-11-06 MED ORDER — SODIUM CHLORIDE 0.9 % IV SOLN
676.8000 mg | Freq: Once | INTRAVENOUS | Status: AC
  Administered 2016-11-06: 680 mg via INTRAVENOUS
  Filled 2016-11-06: qty 68

## 2016-11-06 MED ORDER — SODIUM CHLORIDE 0.9 % IV SOLN
Freq: Once | INTRAVENOUS | Status: AC
  Administered 2016-11-06: 09:00:00 via INTRAVENOUS

## 2016-11-06 MED ORDER — PALONOSETRON HCL INJECTION 0.25 MG/5ML
0.2500 mg | Freq: Once | INTRAVENOUS | Status: AC
  Administered 2016-11-06: 0.25 mg via INTRAVENOUS
  Filled 2016-11-06: qty 5

## 2016-11-06 MED ORDER — DIPHENHYDRAMINE HCL 50 MG/ML IJ SOLN
25.0000 mg | Freq: Once | INTRAMUSCULAR | Status: DC | PRN
Start: 2016-11-06 — End: 2016-11-06

## 2016-11-06 MED ORDER — ALBUTEROL SULFATE (2.5 MG/3ML) 0.083% IN NEBU: 2.5000 mg | INHALATION_SOLUTION | Freq: Once | RESPIRATORY_TRACT | Status: DC | PRN

## 2016-11-06 MED ORDER — FAMOTIDINE IN NACL 20-0.9 MG/50ML-% IV SOLN: 20.0000 mg | Freq: Once | INTRAVENOUS | Status: DC | PRN

## 2016-11-06 NOTE — Progress Notes (Signed)
Ellen Hunt tolerated chemo tx and Neulasta on-pro well without complaints or incident. Labs reviewed prior to administering chemotherapy.Neulasta on-pro applied to pt's left arm with green indicator light flashing. VSS upon discharge. Pt discharged self ambulatory in satisfactory condition accompanied by her husband

## 2016-11-06 NOTE — Patient Instructions (Signed)
[facility] Discharge Instructions for Patients Receiving Chemotherapy   Beginning January 23rd 2017 lab work for the Cancer Center will be done in the  Main lab at [facility] on 1st floor. If you have a lab appointment with the Cancer Center please come in thru the  Main Entrance and check in at the main information desk   Today you received the following chemotherapy agents Taxol and Carboplatin as well as Neulasta on-pro. Follow-up as scheduled. Call clinic for any questions or concerns  To help prevent nausea and vomiting after your treatment, we encourage you to take your nausea medication   If you develop nausea and vomiting, or diarrhea that is not controlled by your medication, call the clinic.  The clinic phone number is [phone]. Office hours are Monday-Friday 8:30am-5:00pm.  BELOW ARE SYMPTOMS THAT SHOULD BE REPORTED IMMEDIATELY:  *FEVER GREATER THAN 101.0 F  *CHILLS WITH OR WITHOUT FEVER  NAUSEA AND VOMITING THAT IS NOT CONTROLLED WITH YOUR NAUSEA MEDICATION  *UNUSUAL SHORTNESS OF BREATH  *UNUSUAL BRUISING OR BLEEDING  TENDERNESS IN MOUTH AND THROAT WITH OR WITHOUT PRESENCE OF ULCERS  *URINARY PROBLEMS  *BOWEL PROBLEMS  UNUSUAL RASH Items with * indicate a potential emergency and should be followed up as soon as possible. If you have an emergency after office hours please contact your primary care physician or go to the nearest emergency department.  Please call the clinic during office hours if you have any questions or concerns.   You may also contact the Patient Navigator at [phone] should you have any questions or need assistance in obtaining follow up care.      Resources For Cancer Patients and their Caregivers ? American Cancer Society: Can assist with transportation, wigs, general needs, runs Look Good Feel Better.        1-888-227-6333 ? Cancer Care: Provides financial assistance, online support groups,  medication/co-pay assistance.  1-800-813-HOPE (4673) ? [facility] Assists [place] cancer patients and their families through emotional , educational and financial support.  336-427-4357 ? [place] DSS Where to apply for food stamps, Medicaid and utility assistance. 336-342-1394 ? [facility]: Transportation to medical appointments. 336-347-2287 ? Social Security Administration: May apply for disability if have a Stage IV cancer. 336-342-7796 1-800-772-1213 ? [place] Aging, Disability and Transit Services: Assists with nutrition, care and transit needs. 336-349-2343         

## 2016-11-10 ENCOUNTER — Other Ambulatory Visit (HOSPITAL_COMMUNITY): Payer: Self-pay | Admitting: Hematology & Oncology

## 2016-11-12 ENCOUNTER — Telehealth (HOSPITAL_COMMUNITY): Payer: Self-pay | Admitting: Emergency Medicine

## 2016-11-12 NOTE — Telephone Encounter (Signed)
Pt called and stated that she has a floater in her left eye.  She wanted to know if it was chemo related.  Spoke with [name] PA and he said it did not have anything to do with chemo and that she needed to see her eye doctor right away.  Verbalized understanding.

## 2016-11-18 ENCOUNTER — Telehealth: Payer: Self-pay | Admitting: Family Medicine

## 2016-11-18 ENCOUNTER — Encounter: Payer: Self-pay | Admitting: Family Medicine

## 2016-11-18 ENCOUNTER — Ambulatory Visit (INDEPENDENT_AMBULATORY_CARE_PROVIDER_SITE_OTHER): Payer: BC Managed Care – PPO | Admitting: Family Medicine

## 2016-11-18 ENCOUNTER — Telehealth (HOSPITAL_COMMUNITY): Payer: Self-pay | Admitting: *Deleted

## 2016-11-18 VITALS — BP 121/72 | HR 104 | Temp 99.3°F | Ht 63.0 in | Wt 204.0 lb

## 2016-11-18 DIAGNOSIS — J029 Acute pharyngitis, unspecified: Secondary | ICD-10-CM

## 2016-11-18 DIAGNOSIS — R509 Fever, unspecified: Secondary | ICD-10-CM | POA: Diagnosis not present

## 2016-11-18 LAB — CULTURE, GROUP A STREP

## 2016-11-18 LAB — RAPID STREP SCREEN (MED CTR MEBANE ONLY): STREP GP A AG, IA W/REFLEX: NEGATIVE

## 2016-11-18 MED ORDER — AZITHROMYCIN 250 MG PO TABS: ORAL_TABLET | ORAL | 0 refills | Status: DC

## 2016-11-18 NOTE — Progress Notes (Addendum)
Subjective:    Patient ID: Ellen Hunt, female    DOB: 03-28-1951, 66 y.o.   MRN: [id number]  HPI Patient here today for fever. She is currently taking chemo treatment and she recently had surgery for ovarian cancer. The patient was told by the oncologist that if her temperature got over 100.5 that she should see the doctor. [place] of a sore throat and fever. History was reviewed with the patient of her abdominal pain abdominal hysterectomy and nephrectomy and debulking of tumor and the series of chemotherapy that she has had. She just developed low-grade fever today with cough and congestion and sore throat. The patient denies any chest pain or shortness of breath other than a slight cough. She's not had any trouble with her stomach including nausea vomiting diarrhea. She is passing her water without problems.     Patient Active Problem List   Diagnosis Date Noted  . Infection of urinary tract 10/14/2016  . B12 deficiency 08/31/2016  . 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 Chemotherapy-induced neuropathy ([place]) 08/07/2016  . Glaucoma 08/07/2016  . Genetic testing 06/27/2016  . Family history of breast cancer   . Shingles 05/09/2016  . 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 Nausea with vomiting 04/30/2016  . Sciatica neuralgia 02/12/2015  . Severe obesity (BMI >= 40) ([place]) 10/12/2014  . BP (high blood pressure) 09/04/2014  . Adiposity 09/04/2014  . Type 2 diabetes mellitus treated with insulin ([place]) 01/25/2014  . Vitamin D insufficiency 01/25/2014  . Hyperlipidemia 01/25/2014  . Osteopenia 01/25/2014   Outpatient Encounter Prescriptions as of 11/18/2016  Medication Sig  . aspirin 81 MG tablet Take 1 tablet (81 mg total) by mouth daily.  Marland Kitchen atorvastatin (LIPITOR) 40 MG tablet TAKE 1 TABLET BY MOUTH EVERY DAY  . benazepril (LOTENSIN) 10 MG tablet TAKE 1 TABLET BY MOUTH EVERY DAY  . CARBOPLATIN IV Inject into the vein. Every 21 days  .  dexamethasone (DECADRON) 4 MG tablet The day before chemo take 5 tabs (20mg  total) in the am and 5 tabs (20mg  total) in the pm. The morning of chemo take 5 tabs (20mg  total).  . diazepam (VALIUM) 5 MG tablet Take 0.5 tablets (2.5 mg total) by mouth every 4 (four) hours as needed (spasms).  . enoxaparin (LOVENOX) 40 MG/0.4ML injection Inject 0.4 mLs (40 mg total) into the skin daily.  . furosemide (LASIX) 20 MG tablet Take 1 tablet (20 mg total) by mouth daily.  Marland Kitchen glucagon (GLUCAGON EMERGENCY) 1 MG injection Inject as directed as needed for low blood glucose if unresponsive.  Marland Kitchen glucosamine-chondroitin 500-400 MG tablet Take 1 tablet by mouth 2 (two) times daily.  Marland Kitchen glucose blood (ONE TOUCH ULTRA TEST) test strip USE TO CHECK BLOOD SUGAR UP TO 5 TIMES A DAY  . HYDROcodone-acetaminophen (NORCO) 10-325 MG tablet Take 1 tablet by mouth every 4 (four) hours as needed.  Marland Kitchen ibuprofen (ADVIL,MOTRIN) 800 MG tablet Take 1 tablet (800 mg total) by mouth every 8 (eight) hours as needed (mild pain).  . Insulin Glargine (BASAGLAR KWIKPEN) 100 UNIT/ML SOPN Inject 55 Units into the skin daily as needed (if unable to use insulin pump).   . lidocaine-prilocaine (EMLA) cream Apply a quarter size amount to port site 1 hour prior to chemo. Do not rub in. Cover with plastic wrap.  . metFORMIN (GLUCOPHAGE) 1000 MG tablet TAKE 1 TABLET BY MOUTH TWICE DAILY WITH A MEAL  . Multiple Vitamin (MULTIVITAMIN WITH MINERALS) TABS Take 1 tablet by mouth  daily.  . multivitamin-lutein (OCUVITE-LUTEIN) CAPS capsule Take 1 capsule by mouth daily.  Marland Kitchen NOVOLOG 100 UNIT/ML injection USE IN INSULIN PUMP AS DIRECTED (MAX DAILY DOSE IS 110 UNITS PER DAY)  . omeprazole (PRILOSEC) 40 MG capsule TAKE 1 CAPSULE(40 MG) BY MOUTH DAILY  . ondansetron (ZOFRAN) 8 MG tablet Take 1 tablet (8 mg total) by mouth every 8 (eight) hours as needed for nausea or vomiting.  Marland Kitchen PACLitaxel (TAXOL IV) Inject into the vein. Every 21 days  . polyethylene glycol  (MIRALAX / GLYCOLAX) packet Take 17 g by mouth daily.  Marland Kitchen senna (SENOKOT) 8.6 MG TABS tablet Take 1 tablet (8.6 mg total) by mouth at bedtime as needed for moderate constipation.  . valACYclovir (VALTREX) 500 MG tablet TAKE 1 TABLET(500 MG) BY MOUTH TWICE DAILY. START AFTER COMPLETION OF 1000 MG BY MOUTH 3 TIMES DAILY  . vitamin E (VITAMIN E) 400 UNIT capsule Take 400 Units by mouth daily.  . [DISCONTINUED] ciprofloxacin (CIPRO) 250 MG tablet Take 1 tablet (250 mg total) by mouth 2 (two) times daily.  . [DISCONTINUED] omeprazole (PRILOSEC) 40 MG capsule TAKE 1 CAPSULE(40 MG) BY MOUTH DAILY   No facility-administered encounter medications on file as of 11/18/2016.      Review of Systems  Constitutional: Positive for fever.  HENT: Positive for postnasal drip and sore throat.   Eyes: Negative.   Respiratory: Positive for cough.   Cardiovascular: Negative.   Gastrointestinal: Negative.   Endocrine: Negative.   Genitourinary: Negative.   Musculoskeletal: Negative.   Skin: Negative.   Allergic/Immunologic: Negative.   Neurological: Negative.   Hematological: Negative.   Psychiatric/Behavioral: Negative.        Objective:   Physical Exam  Constitutional: She is oriented to person, place, and time. She appears well-developed and well-nourished.  HENT:  Head: Normocephalic and atraumatic.  Slight redness posterior throat right greater than left. Bilateral ear cerumen with right being worse than the left.  Eyes: Conjunctivae and EOM are normal. Pupils are equal, round, and reactive to light. Right eye exhibits no discharge. Left eye exhibits no discharge. No scleral icterus.  Neck: Normal range of motion. Neck supple. No thyromegaly present.  No anterior cervical adenopathy.  Cardiovascular: Normal rate, regular rhythm and normal heart sounds.   No murmur heard. Pulmonary/Chest: Effort normal and breath sounds normal. No respiratory distress. She has no wheezes. She has no rales.  A dry  cough. Chest is clear anteriorly and posteriorly.  Abdominal: Soft. Bowel sounds are normal. She exhibits no mass. There is tenderness. There is no rebound and no guarding.  Slight abdominal tenderness  Musculoskeletal: Normal range of motion. She exhibits no edema.  Lymphadenopathy:    She has no cervical adenopathy.  Neurological: She is alert and oriented to person, place, and time.  Skin: Skin is warm and dry. No rash noted.  Psychiatric: She has a normal mood and affect. Her behavior is normal. Judgment and thought content normal.  Nursing note and vitals reviewed.  BP 121/72 (BP Location: Left Arm)   Pulse (!) 104   Temp 99.3 F (37.4 C) (Oral)   Ht 5\' 3"  (1.6 m)   Wt 204 lb (92.5 kg)   BMI 36.14 kg/m   Bilateral ear irrigation to remove cerumen      Assessment & Plan:  1. Fever, unspecified fever cause -Take Tylenol for aches pains and fever - CBC with Differential/Platelet - Rapid strep screen (not at [facility])  2. Sore throat - CBC with Differential/Platelet -  Rapid strep screen (not at [facility]) -Take Z-Pak as directed and until completed -Take Mucinex for cough and congestion  Meds ordered this encounter  Medications  . azithromycin (ZITHROMAX) 250 MG tablet    Sig: As directed    Dispense:  6 tablet    Refill:  0   Patient Instructions  Take antibiotic as directed and until completed Drink plenty of fluids and stay well hydrated Monitor blood sugars closely Take Tylenol as needed for aches pains and fever Use nasal saline and Mucinex for cough and congestion Use cool mist humidification  Arrie Senate MD

## 2016-11-18 NOTE — Telephone Encounter (Signed)
Spoke with patients husband [name] via telephone. He states that Ellen Hunt is running a 100.7 temperature since this morning, sore throat and a deep cough.  Advised patient to go to the emergency room for treatment.  He verbalizes understanding.

## 2016-11-18 NOTE — Patient Instructions (Signed)
Take antibiotic as directed and until completed Drink plenty of fluids and stay well hydrated Monitor blood sugars closely Take Tylenol as needed for aches pains and fever Use nasal saline and Mucinex for cough and congestion Use cool mist humidification

## 2016-11-18 NOTE — Telephone Encounter (Signed)
appt given  

## 2016-11-19 ENCOUNTER — Other Ambulatory Visit (HOSPITAL_COMMUNITY): Payer: Self-pay

## 2016-11-19 ENCOUNTER — Ambulatory Visit (HOSPITAL_COMMUNITY): Payer: BC Managed Care – PPO

## 2016-11-19 ENCOUNTER — Other Ambulatory Visit: Payer: Self-pay

## 2016-11-19 ENCOUNTER — Emergency Department (HOSPITAL_COMMUNITY): Payer: BC Managed Care – PPO

## 2016-11-19 ENCOUNTER — Inpatient Hospital Stay (HOSPITAL_COMMUNITY)
Admission: EM | Admit: 2016-11-19 | Discharge: 2016-11-22 | DRG: 871 | Disposition: A | Discharge status: Home / Self Care | Payer: BC Managed Care – PPO | Attending: Family Medicine | Admitting: Family Medicine

## 2016-11-19 ENCOUNTER — Encounter (HOSPITAL_COMMUNITY): Payer: Self-pay

## 2016-11-19 DIAGNOSIS — C569 Malignant neoplasm of unspecified ovary: Secondary | ICD-10-CM | POA: Diagnosis present

## 2016-11-19 DIAGNOSIS — Z801 Family history of malignant neoplasm of trachea, bronchus and lung: Secondary | ICD-10-CM | POA: Diagnosis not present

## 2016-11-19 DIAGNOSIS — E785 Hyperlipidemia, unspecified: Secondary | ICD-10-CM | POA: Diagnosis present

## 2016-11-19 DIAGNOSIS — Z8249 Family history of ischemic heart disease and other diseases of the circulatory system: Secondary | ICD-10-CM | POA: Diagnosis not present

## 2016-11-19 DIAGNOSIS — Z79899 Other long term (current) drug therapy: Secondary | ICD-10-CM | POA: Diagnosis not present

## 2016-11-19 DIAGNOSIS — Z806 Family history of leukemia: Secondary | ICD-10-CM | POA: Diagnosis not present

## 2016-11-19 DIAGNOSIS — D61810 Antineoplastic chemotherapy induced pancytopenia: Secondary | ICD-10-CM | POA: Diagnosis present

## 2016-11-19 DIAGNOSIS — Z9641 Presence of insulin pump (external) (internal): Secondary | ICD-10-CM | POA: Diagnosis present

## 2016-11-19 DIAGNOSIS — Z9221 Personal history of antineoplastic chemotherapy: Secondary | ICD-10-CM | POA: Diagnosis not present

## 2016-11-19 DIAGNOSIS — Z87440 Personal history of urinary (tract) infections: Secondary | ICD-10-CM

## 2016-11-19 DIAGNOSIS — C5700 Malignant neoplasm of unspecified fallopian tube: Secondary | ICD-10-CM | POA: Diagnosis present

## 2016-11-19 DIAGNOSIS — A419 Sepsis, unspecified organism: Principal | ICD-10-CM | POA: Diagnosis present

## 2016-11-19 DIAGNOSIS — K219 Gastro-esophageal reflux disease without esophagitis: Secondary | ICD-10-CM | POA: Diagnosis present

## 2016-11-19 DIAGNOSIS — E118 Type 2 diabetes mellitus with unspecified complications: Secondary | ICD-10-CM

## 2016-11-19 DIAGNOSIS — Z90710 Acquired absence of both cervix and uterus: Secondary | ICD-10-CM

## 2016-11-19 DIAGNOSIS — J189 Pneumonia, unspecified organism: Secondary | ICD-10-CM | POA: Diagnosis present

## 2016-11-19 DIAGNOSIS — I10 Essential (primary) hypertension: Secondary | ICD-10-CM | POA: Diagnosis present

## 2016-11-19 DIAGNOSIS — E119 Type 2 diabetes mellitus without complications: Secondary | ICD-10-CM | POA: Diagnosis present

## 2016-11-19 DIAGNOSIS — C481 Malignant neoplasm of specified parts of peritoneum: Secondary | ICD-10-CM | POA: Diagnosis present

## 2016-11-19 DIAGNOSIS — J09X1 Influenza due to identified novel influenza A virus with pneumonia: Secondary | ICD-10-CM | POA: Diagnosis present

## 2016-11-19 DIAGNOSIS — C482 Malignant neoplasm of peritoneum, unspecified: Secondary | ICD-10-CM | POA: Diagnosis present

## 2016-11-19 DIAGNOSIS — Z794 Long term (current) use of insulin: Secondary | ICD-10-CM

## 2016-11-19 DIAGNOSIS — Y95 Nosocomial condition: Secondary | ICD-10-CM | POA: Diagnosis present

## 2016-11-19 DIAGNOSIS — Z833 Family history of diabetes mellitus: Secondary | ICD-10-CM

## 2016-11-19 DIAGNOSIS — J1100 Influenza due to unidentified influenza virus with unspecified type of pneumonia: Secondary | ICD-10-CM | POA: Diagnosis not present

## 2016-11-19 DIAGNOSIS — Z7982 Long term (current) use of aspirin: Secondary | ICD-10-CM | POA: Diagnosis not present

## 2016-11-19 DIAGNOSIS — T451X5A Adverse effect of antineoplastic and immunosuppressive drugs, initial encounter: Secondary | ICD-10-CM | POA: Diagnosis present

## 2016-11-19 DIAGNOSIS — J111 Influenza due to unidentified influenza virus with other respiratory manifestations: Secondary | ICD-10-CM

## 2016-11-19 DIAGNOSIS — Z803 Family history of malignant neoplasm of breast: Secondary | ICD-10-CM | POA: Diagnosis not present

## 2016-11-19 DIAGNOSIS — R509 Fever, unspecified: Secondary | ICD-10-CM | POA: Diagnosis not present

## 2016-11-19 LAB — COMPREHENSIVE METABOLIC PANEL
ALT: 22 U/L (ref 14–54)
ANION GAP: 10 (ref 5–15)
AST: 25 U/L (ref 15–41)
Albumin: 3.7 g/dL (ref 3.5–5.0)
Alkaline Phosphatase: 70 U/L (ref 38–126)
BILIRUBIN TOTAL: 0.5 mg/dL (ref 0.3–1.2)
BUN: 17 mg/dL (ref 6–20)
CO2: 23 mmol/L (ref 22–32)
Calcium: 8.4 mg/dL — ABNORMAL LOW (ref 8.9–10.3)
Chloride: 103 mmol/L (ref 101–111)
Creatinine, Ser: 0.88 mg/dL (ref 0.44–1.00)
Glucose, Bld: 111 mg/dL — ABNORMAL HIGH (ref 65–99)
POTASSIUM: 4 mmol/L (ref 3.5–5.1)
Sodium: 136 mmol/L (ref 135–145)
TOTAL PROTEIN: 6.6 g/dL (ref 6.5–8.1)

## 2016-11-19 LAB — CBC WITH DIFFERENTIAL/PLATELET
BASOS ABS: 0 10*3/uL (ref 0.0–0.2)
BASOS ABS: 0 10*3/uL (ref 0.0–0.1)
BASOS PCT: 0 %
Basos: 0 %
EOS (ABSOLUTE): 0.1 10*3/uL (ref 0.0–0.4)
EOS: 2 %
Eosinophils Absolute: 0 10*3/uL (ref 0.0–0.7)
Eosinophils Relative: 0 %
HEMATOCRIT: 26.2 % — AB (ref 36.0–46.0)
HEMOGLOBIN: 9.4 g/dL — AB (ref 11.1–15.9)
Hematocrit: 27.6 % — ABNORMAL LOW (ref 34.0–46.6)
Hemoglobin: 8.6 g/dL — ABNORMAL LOW (ref 12.0–15.0)
IMMATURE GRANS (ABS): 0 10*3/uL (ref 0.0–0.1)
IMMATURE GRANULOCYTES: 0 %
LYMPHS ABS: 0.4 10*3/uL — AB (ref 0.7–3.1)
LYMPHS PCT: 7 %
LYMPHS: 9 %
Lymphs Abs: 0.3 10*3/uL — ABNORMAL LOW (ref 0.7–4.0)
MCH: 33.8 pg — AB (ref 26.6–33.0)
MCH: 34 pg (ref 26.0–34.0)
MCHC: 34.1 g/dL (ref 31.5–35.7)
MCHC: 32.8 g/dL (ref 30.0–36.0)
MCV: 99 fL — ABNORMAL HIGH (ref 79–97)
MCV: 103.6 fL — AB (ref 78.0–100.0)
MONOCYTES: 12 %
Monocytes Absolute: 0.5 10*3/uL (ref 0.1–0.9)
Monocytes Absolute: 0.6 10*3/uL (ref 0.1–1.0)
Monocytes Relative: 16 %
NEUTROS ABS: 2.9 10*3/uL (ref 1.7–7.7)
NEUTROS PCT: 77 %
Neutrophils Absolute: 3 10*3/uL (ref 1.4–7.0)
Neutrophils Relative %: 76 %
PLATELETS: 40 10*3/uL — AB (ref 150–379)
Platelets: 27 10*3/uL — CL (ref 150–400)
RBC: 2.78 x10E6/uL — AB (ref 3.77–5.28)
RBC: 2.53 MIL/uL — AB (ref 3.87–5.11)
RDW: 19.5 % — ABNORMAL HIGH (ref 12.3–15.4)
RDW: 18.2 % — ABNORMAL HIGH (ref 11.5–15.5)
WBC: 3.9 10*3/uL (ref 3.4–10.8)
WBC: 3.8 10*3/uL — AB (ref 4.0–10.5)

## 2016-11-19 LAB — I-STAT CG4 LACTIC ACID, ED: LACTIC ACID, VENOUS: 2.25 mmol/L — AB (ref 0.5–1.9)

## 2016-11-19 LAB — INFLUENZA PANEL BY PCR (TYPE A & B)
Influenza A By PCR: POSITIVE — AB
Influenza B By PCR: NEGATIVE

## 2016-11-19 MED ORDER — OSELTAMIVIR PHOSPHATE 75 MG PO CAPS
75.0000 mg | ORAL_CAPSULE | Freq: Once | ORAL | Status: AC
Start: 2016-11-19 — End: 2016-11-19
  Administered 2016-11-19: 75 mg via ORAL
  Filled 2016-11-19: qty 1

## 2016-11-19 MED ORDER — VANCOMYCIN HCL IN DEXTROSE 1-5 GM/200ML-% IV SOLN
1000.0000 mg | Freq: Once | INTRAVENOUS | Status: AC
Start: 2016-11-19 — End: 2016-11-20
  Administered 2016-11-20: 1000 mg via INTRAVENOUS
  Filled 2016-11-19: qty 200

## 2016-11-19 MED ORDER — SODIUM CHLORIDE 0.9 % IV BOLUS (SEPSIS)
1000.0000 mL | Freq: Once | INTRAVENOUS | Status: AC
  Administered 2016-11-19: 2000 mL via INTRAVENOUS
  Administered 2016-11-19: 1000 mL via INTRAVENOUS

## 2016-11-19 MED ORDER — SODIUM CHLORIDE 0.9 % IV BOLUS (SEPSIS): 1000.0000 mL | Freq: Once | INTRAVENOUS | Status: DC

## 2016-11-19 MED ORDER — DEXTROSE 5 % IV SOLN
1.0000 g | Freq: Once | INTRAVENOUS | Status: AC
  Administered 2016-11-19: 1 g via INTRAVENOUS
  Filled 2016-11-19: qty 1

## 2016-11-19 NOTE — H&P (Signed)
History and Physical    Bambi Kelp [id number] DOB: August 26, 1951 DOA: 11/19/2016  PCP: [name], MD Consultants:  [facility] - oncology; [place] Patient coming from: home - lives with husband and son; NOK: husband, [phone]  Chief Complaint: fever  HPI: Laramie Vanwormer is a 66 y.o. female with medical history significant of ovarian cancer on chemotherapy; HTN; HLD; DM on an insulin pump presenting with fever.  Started feeling bad yesterday morning.  Unable to get ahold of anyone initially but eventually was seen at her PCP office at 330 pm yesterday.  Was given a Z-pack and Mucinex, but he wasn't sure what was wrong with her.  Did not hear PNA despite careful listening on her lung exam, by their report.  Strep test was negative.  CBC also done.  Since then, fever has gone up to 101 but higher tonight (103.6), coughing, productive of clear sputum.  It stings when she coughs because her throat continues to hurt.  She does not feel like she has the flu. No sick contacts.  Ovarian cancer, diagnosed in late June 2017.  Dec 12, she had a robotic hysterectomy and peritoneal debulking, discharged on Dec 13.  Resumed chemo on Jan 4, due again on Jan 25.   ED Course: Per Dr. Alvino Chapel: Patient with fever on chemotherapy. Has sore throat and cough. Negative strep yesterday. X-ray showed possible pneumonia. Start empirically on antibiotics. Lactic acid mildly elevated. Antibiotics given. However the flu test did come back positive. She is high risk and started on Tamiflu. Will admit to internal medicine.   Review of Systems: As per HPI; otherwise 10 point review of systems reviewed and negative.   Ambulatory Status:  Ambulates without assistance  Past Medical History:  Diagnosis Date  . Diabetes mellitus without complication (HCC)    on insulin pump  . Dysrhythmia   . 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 Family history of breast cancer   . GERD  (gastroesophageal reflux disease)   . History of blood transfusion   . History of bronchitis   . History of chemotherapy   . History of urinary tract infection   . Hyperlipidemia   . Hypertension   . Low serum vitamin D   . Ovarian cancer ([place]) 05/09/2016  . Shingles     Past Surgical History:  Procedure Laterality Date  . CESAREAN SECTION    . DEBULKING N/A 10/14/2016   Procedure: DEBULKING;  Surgeon: Everitt Amber, MD;  Location: [facility];  Service: Gynecology;  Laterality: N/A;  . LAPAROTOMY WITH STAGING N/A 10/14/2016   Procedure: LAPAROTOMY WITH OMENTECTOMY AND TUMOR DEBULGING;  Surgeon: Everitt Amber, MD;  Location: [facility];  Service: Gynecology;  Laterality: N/A;  . LUMBAR FUSION  08/21/15   L3-L4 Dr. Timmothy Euler  . OMENTECTOMY N/A 10/14/2016   Procedure: OMENTECTOMY;  Surgeon: Everitt Amber, MD;  Location: [facility];  Service: Gynecology;  Laterality: N/A;  . ROBOTIC ASSISTED TOTAL HYSTERECTOMY WITH BILATERAL SALPINGO OOPHERECTOMY Bilateral 10/14/2016   Procedure: XI ROBOTIC ASSISTED TOTAL LAPARSCOPIC  HYSTERECTOMY WITH BILATERAL SALPINGO OOPHORECTOMY;  Surgeon: Everitt Amber, MD;  Location: [facility];  Service: Gynecology;  Laterality: Bilateral;    Social History   Social History  . Marital status: Married    Spouse name: Gershon Mussel  . Number of children: 2  . Years of education: N/A   Occupational History  . retired    Social History Main Topics  . Smoking status: Never Smoker  . Smokeless tobacco:  Never Used  . Alcohol use No  . Drug use: No  . Sexual activity: Not on file     Comment: married   Other Topics Concern  . Not on file   Social History Narrative  . No narrative on file    Allergies  Allergen Reactions  . Avandia [Rosiglitazone] Other (See Comments)    Legs swelled  . Micronase [Glyburide] Swelling  . Actos [Pioglitazone] Other (See Comments)    Edema / leg swelling    Family History  Problem Relation Age of Onset  . Lung cancer Mother     smoker; dx in her  79s  . Leukemia Father   . Diabetes Paternal Grandmother   . Heart attack Paternal Grandmother   . Diabetes Paternal Grandfather   . Breast cancer Paternal Aunt     dx in her 25s-30s  . Leukemia Paternal Uncle   . Heart attack Maternal Grandfather   . Breast cancer Cousin     maternal first cousin    Prior to Admission medications   Medication Sig Start Date End Date Taking? Authorizing Provider  aspirin 81 MG tablet Take 1 tablet (81 mg total) by mouth daily. 04/21/14   Cherre Robins, PharmD  atorvastatin (LIPITOR) 40 MG tablet TAKE 1 TABLET BY MOUTH EVERY DAY 09/22/16   Chipper Herb, MD  azithromycin [facility]) 250 MG tablet As directed 11/18/16   Chipper Herb, MD  benazepril (LOTENSIN) 10 MG tablet TAKE 1 TABLET BY MOUTH EVERY DAY 09/22/16   Chipper Herb, MD  CARBOPLATIN IV Inject into the vein. Every 21 days    Historical Provider, MD  dexamethasone (DECADRON) 4 MG tablet The day before chemo take 5 tabs (20mg  total) in the am and 5 tabs (20mg  total) in the pm. The morning of chemo take 5 tabs (20mg  total). 09/17/16   Patrici Ranks, MD  diazepam (VALIUM) 5 MG tablet Take 0.5 tablets (2.5 mg total) by mouth every 4 (four) hours as needed (spasms). 10/30/16   Baird Cancer, PA-C  enoxaparin (LOVENOX) 40 MG/0.4ML injection Inject 0.4 mLs (40 mg total) into the skin daily. 10/16/16   Everitt Amber, MD  furosemide (LASIX) 20 MG tablet Take 1 tablet (20 mg total) by mouth daily. 08/28/16   Patrici Ranks, MD  glucagon (GLUCAGON EMERGENCY) 1 MG injection Inject as directed as needed for low blood glucose if unresponsive. 05/12/16   Cherre Robins, PharmD  glucosamine-chondroitin 500-400 MG tablet Take 1 tablet by mouth 2 (two) times daily.    Historical Provider, MD  glucose blood (ONE TOUCH ULTRA TEST) test strip USE TO CHECK BLOOD SUGAR UP TO 5 TIMES A DAY 01/11/16   [name], MD  HYDROcodone-acetaminophen [facility]) 10-325 MG tablet Take 1 tablet by mouth every 4 (four) hours  as needed. 10/30/16   Baird Cancer, PA-C  ibuprofen (ADVIL,MOTRIN) 800 MG tablet Take 1 tablet (800 mg total) by mouth every 8 (eight) hours as needed (mild pain). 10/15/16   Everitt Amber, MD  Insulin Glargine [facility] KWIKPEN) 100 UNIT/ML SOPN Inject 55 Units into the skin daily as needed (if unable to use insulin pump).     Historical Provider, MD  lidocaine-prilocaine (EMLA) cream Apply a quarter size amount to port site 1 hour prior to chemo. Do not rub in. Cover with plastic wrap. 05/10/16   Patrici Ranks, MD  metFORMIN (GLUCOPHAGE) 1000 MG tablet TAKE 1 TABLET BY MOUTH TWICE DAILY WITH A MEAL 08/18/16  Cherre Robins, PharmD  Multiple Vitamin (MULTIVITAMIN WITH MINERALS) TABS Take 1 tablet by mouth daily.    Historical Provider, MD  multivitamin-lutein (OCUVITE-LUTEIN) CAPS capsule Take 1 capsule by mouth daily.    Historical Provider, MD  NOVOLOG 100 UNIT/ML injection USE IN INSULIN PUMP AS DIRECTED (MAX DAILY DOSE IS 110 UNITS PER DAY) 10/24/16   [name], MD  omeprazole (PRILOSEC) 40 MG capsule TAKE 1 CAPSULE(40 MG) BY MOUTH DAILY 10/30/16   Baird Cancer, PA-C  ondansetron (ZOFRAN) 8 MG tablet Take 1 tablet (8 mg total) by mouth every 8 (eight) hours as needed for nausea or vomiting. 10/22/16   Baird Cancer, PA-C  PACLitaxel (TAXOL IV) Inject into the vein. Every 21 days    Historical Provider, MD  polyethylene glycol (MIRALAX / GLYCOLAX) packet Take 17 g by mouth daily.    Historical Provider, MD  senna (SENOKOT) 8.6 MG TABS tablet Take 1 tablet (8.6 mg total) by mouth at bedtime as needed for moderate constipation. 10/15/16   Everitt Amber, MD  valACYclovir (VALTREX) 500 MG tablet TAKE 1 TABLET(500 MG) BY MOUTH TWICE DAILY. START AFTER COMPLETION OF 1000 MG BY MOUTH 3 TIMES DAILY 11/11/16   Patrici Ranks, MD  vitamin E (VITAMIN E) 400 UNIT capsule Take 400 Units by mouth daily.    Historical Provider, MD    Physical Exam: Vitals:   11/19/16 2330 11/20/16 0000 11/20/16  0005 11/20/16 0035  BP: (!) 100/46 (!) 102/46  (!) 120/48  Pulse: 98 96  94  Resp: (!) 27 22    Temp:   101.3 F (38.5 C) (!) 100.4 F (38 C)  TempSrc:   Oral Oral  SpO2: 91% 94%  97%  Weight:    92.7 kg (204 lb 5 oz)  Height:    5\' 3"  (1.6 m)     General:  Appears calm and comfortable and is NAD Eyes:  PERRL, EOMI, normal lids, iris ENT:  grossly normal hearing, lips & tongue, mmm Neck:  no LAD, masses or thyromegaly Cardiovascular:  RRR, no m/r/g. No LE edema.  Respiratory: RLL rhonchi, no w/r/r. Normal respiratory effort. Abdomen:  soft, ntnd, NABS Skin:  no rash or induration seen on limited exam Musculoskeletal:  grossly normal tone BUE/BLE, good ROM, no bony abnormality Psychiatric:  grossly normal mood and affect, speech fluent and appropriate, AOx3 Neurologic:  CN 2-12 grossly intact, moves all extremities in coordinated fashion, sensation intact  Labs on Admission: I have personally reviewed following labs and imaging studies  CBC:  Recent Labs Lab 11/18/16 1642 11/19/16 2223  WBC 3.9 3.8*  NEUTROABS 3.0 2.9  HGB  --  8.6*  HCT 27.6* 26.2*  MCV 99* 103.6*  PLT 40* 27*   Basic Metabolic Panel:  Recent Labs Lab 11/19/16 2223  NA 136  K 4.0  CL 103  CO2 23  GLUCOSE 111*  BUN 17  CREATININE 0.88  CALCIUM 8.4*   GFR: Estimated Creatinine Clearance: 70.8 mL/min (by C-G formula based on SCr of 0.88 mg/dL). Liver Function Tests:  Recent Labs Lab 11/19/16 2223  AST 25  ALT 22  ALKPHOS 70  BILITOT 0.5  PROT 6.6  ALBUMIN 3.7   No results for input(s): LIPASE, AMYLASE in the last 168 hours. No results for input(s): AMMONIA in the last 168 hours. Coagulation Profile: No results for input(s): INR, PROTIME in the last 168 hours. Cardiac Enzymes: No results for input(s): CKTOTAL, CKMB, CKMBINDEX, TROPONINI in the last 168 hours.  BNP (last 3 results) No results for input(s): PROBNP in the last 8760 hours. HbA1C: No results for input(s): HGBA1C in  the last 72 hours. CBG: No results for input(s): GLUCAP in the last 168 hours. Lipid Profile: No results for input(s): CHOL, HDL, LDLCALC, TRIG, CHOLHDL, LDLDIRECT in the last 72 hours. Thyroid Function Tests: No results for input(s): TSH, T4TOTAL, FREET4, T3FREE, THYROIDAB in the last 72 hours. Anemia Panel: No results for input(s): VITAMINB12, FOLATE, FERRITIN, TIBC, IRON, RETICCTPCT in the last 72 hours. Urine analysis:    Component Value Date/Time   COLORURINE YELLOW 11/19/2016 2307   APPEARANCEUR CLEAR 11/19/2016 2307   LABSPEC 1.021 11/19/2016 2307   PHURINE 5.0 11/19/2016 2307   GLUCOSEU NEGATIVE 11/19/2016 2307   HGBUR NEGATIVE 11/19/2016 2307   BILIRUBINUR NEGATIVE 11/19/2016 2307   KETONESUR NEGATIVE 11/19/2016 2307   PROTEINUR NEGATIVE 11/19/2016 2307   NITRITE NEGATIVE 11/19/2016 2307   LEUKOCYTESUR TRACE (A) 11/19/2016 2307    Creatinine Clearance: Estimated Creatinine Clearance: 70.8 mL/min (by C-G formula based on SCr of 0.88 mg/dL).  Sepsis Labs: @LABRCNTIP (procalcitonin:4,lacticidven:4) ) Recent Results (from the past 240 hour(s))  Rapid strep screen (not at [facility])     Status: None   Collection Time: 11/18/16  4:17 PM  Result Value Ref Range Status   Strep Gp A Ag, IA W/Reflex Negative Negative Final  Culture, Group A Strep     Status: None   Collection Time: 11/18/16  4:17 PM  Result Value Ref Range Status   Strep A Culture CANCELED      Comment: Test not performed  Result canceled by the ancillary      Radiological Exams on Admission: Dg Chest 2 View  Result Date: 11/19/2016 CLINICAL DATA:  Cough and fever. Active chemotherapy for ovarian cancer. EXAM: CHEST  2 VIEW COMPARISON:  CT 09/11/2016.  Chest radiograph 07/30/2015 FINDINGS: Tip of the right chest port in the mid SVC. Minimal right infrahilar opacity may represent focal pneumonia. No additional airspace consolidation. The heart size and mediastinal contours are unchanged from prior exam. No  pleural effusion or pneumothorax. No acute osseous abnormalities are seen. IMPRESSION: Mild patchy right infrahilar opacity may reflect small focal pneumonia. Electronically Signed   By: Jeb Levering M.D.   On: 11/19/2016 22:56    EKG: Independently reviewed.  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   -Will start Cefepime and Vancomycin as per protocol. -LR @ 75cc/hr - Fever control - Repeat CBC in am - Sputum cultures -Blood and urine cultures pending -Will admit with telemetry and continue to monitor -Will give Tamiflu given her high likelihood for serious illness -Will trend lactate to ensure improvement  Pancytopenia -WBC 3.8, which is lower than it has been since November -Hgb 8.6, downtrending from 11/06/16 -Platelets 27, the lowest they have been -She is not currently neutropenic but we will need to monitor for this -No  Lovenox or heparin-containing medications at this time  DM on an insulin pump -Will allow patient to continue her insulin pump -Insulin pump order set utilized  Ovarian cancer -Stage IIIC primary peritoneal carcinomatosis with gradual response to chemotherapy (Carboplatin/Paclitaxel) followed by cytoreductive surgery on 12/12 -Plan based on PA  Kefalas's last note is for 3 more cycles of chemotherapy and then restaging imaging  DVT prophylaxis:  SCDs Code Status: Full - confirmed with patient/family Family Communication: Husband present throughout evaluation  Disposition Plan:  Home once clinically improved Consults called: None  Admission status: Admit - It is my clinical opinion that admission to INPATIENT is reasonable and necessary because this patient will require at least 2 midnights in the hospital to treat this condition based on the medical complexity of the problems presented.  Given the aforementioned information, the predictability of an adverse outcome is felt to be significant.    Karmen Bongo MD [facility]  If 7PM-7AM, please contact night-coverage [email or web address] Password [facility]  11/20/2016, 12:43 AM

## 2016-11-19 NOTE — ED Provider Notes (Signed)
[facility] DEPT Provider Note   CSN: [id number] Arrival date & time: 11/19/16  2211     History   Chief Complaint Chief Complaint  Patient presents with  . Fever    HPI Ellen Hunt is a 66 y.o. female.  HPI patient presents with fever and feeling bad. Had a temperature yesterday and was seen by her PCP in the office. Started on azithromycin. Today fever productive cough and sore throat. She has a history of breast cancer is on chemotherapy. She was not neutropenic yesterday but was thrombocytopenic. Around a month ago had debulking of her ovarian masses. She states her abdomen feels well. No dysuria. No sick contacts.  Past Medical History:  Diagnosis Date  . Abnormal CT of the abdomen 04/30/2016  . Diabetes mellitus without complication ([place])   . Dysrhythmia   . 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 Family history of breast cancer   . GERD (gastroesophageal reflux disease)   . History of blood transfusion   . History of bronchitis   . History of chemotherapy   . History of urinary tract infection   . Hyperlipidemia   . Hypertension   . Low serum vitamin D   . Ovarian cancer ([place]) 05/09/2016  . Shingles     Patient Active Problem List   Diagnosis Date Noted  . Infection of urinary tract 10/14/2016  . B12 deficiency 08/31/2016  . 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 Glaucoma 08/07/2016  . Genetic testing 06/27/2016  . Family history of breast cancer   . Shingles 05/09/2016  . 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 Nausea with vomiting 04/30/2016  . Sciatica neuralgia 02/12/2015  . Severe obesity (BMI >= 40) ([place]) 10/12/2014  . BP (high blood pressure) 09/04/2014  . Adiposity 09/04/2014  . Type 2 diabetes mellitus treated with insulin ([place]) 01/25/2014  . Vitamin D insufficiency 01/25/2014  . Hyperlipidemia 01/25/2014  . Osteopenia 01/25/2014      Past Surgical History:  Procedure Laterality Date  . CESAREAN SECTION    . DEBULKING N/A 10/14/2016   Procedure: DEBULKING;  Surgeon: Everitt Amber, MD;  Location: [facility] ORS;  Service: Gynecology;  Laterality: N/A;  . LAPAROTOMY WITH STAGING N/A 10/14/2016   Procedure: LAPAROTOMY WITH OMENTECTOMY AND TUMOR DEBULGING;  Surgeon: Everitt Amber, MD;  Location: [facility];  Service: Gynecology;  Laterality: N/A;  . LUMBAR FUSION  08/21/15   L3-L4 Dr. Timmothy Euler  . OMENTECTOMY N/A 10/14/2016   Procedure: OMENTECTOMY;  Surgeon: Everitt Amber, MD;  Location: [facility];  Service: Gynecology;  Laterality: N/A;  . ROBOTIC ASSISTED TOTAL HYSTERECTOMY WITH BILATERAL SALPINGO OOPHERECTOMY Bilateral 10/14/2016   Procedure: XI ROBOTIC ASSISTED TOTAL LAPARSCOPIC  HYSTERECTOMY WITH BILATERAL SALPINGO OOPHORECTOMY;  Surgeon: Everitt Amber, MD;  Location: [facility];  Service: Gynecology;  Laterality: Bilateral;    OB History    No data available       Home Medications    Prior to Admission medications   Medication Sig Start Date End Date Taking? Authorizing Provider  aspirin 81 MG tablet Take 1 tablet (81 mg total) by mouth daily. 04/21/14   Cherre Robins, PharmD  atorvastatin (LIPITOR) 40 MG tablet TAKE 1 TABLET BY MOUTH EVERY DAY 09/22/16   Chipper Herb, MD  azithromycin [facility]) 250 MG tablet As directed 11/18/16   Chipper Herb, MD  benazepril (LOTENSIN) 10 MG tablet TAKE 1 TABLET BY MOUTH EVERY DAY 09/22/16   Chipper Herb,  MD  CARBOPLATIN IV Inject into the vein. Every 21 days    Historical Provider, MD  dexamethasone (DECADRON) 4 MG tablet The day before chemo take 5 tabs (20mg  total) in the am and 5 tabs (20mg  total) in the pm. The morning of chemo take 5 tabs (20mg  total). 09/17/16   Patrici Ranks, MD  diazepam (VALIUM) 5 MG tablet Take 0.5 tablets (2.5 mg total) by mouth every 4 (four) hours as needed (spasms). 10/30/16   Baird Cancer, PA-C  enoxaparin (LOVENOX) 40 MG/0.4ML injection Inject 0.4 mLs  (40 mg total) into the skin daily. 10/16/16   Everitt Amber, MD  furosemide (LASIX) 20 MG tablet Take 1 tablet (20 mg total) by mouth daily. 08/28/16   Patrici Ranks, MD  glucagon (GLUCAGON EMERGENCY) 1 MG injection Inject as directed as needed for low blood glucose if unresponsive. 05/12/16   Cherre Robins, PharmD  glucosamine-chondroitin 500-400 MG tablet Take 1 tablet by mouth 2 (two) times daily.    Historical Provider, MD  glucose blood (ONE TOUCH ULTRA TEST) test strip USE TO CHECK BLOOD SUGAR UP TO 5 TIMES A DAY 01/11/16   [name], MD  HYDROcodone-acetaminophen [facility]) 10-325 MG tablet Take 1 tablet by mouth every 4 (four) hours as needed. 10/30/16   Baird Cancer, PA-C  ibuprofen (ADVIL,MOTRIN) 800 MG tablet Take 1 tablet (800 mg total) by mouth every 8 (eight) hours as needed (mild pain). 10/15/16   Everitt Amber, MD  Insulin Glargine [facility] KWIKPEN) 100 UNIT/ML SOPN Inject 55 Units into the skin daily as needed (if unable to use insulin pump).     Historical Provider, MD  lidocaine-prilocaine (EMLA) cream Apply a quarter size amount to port site 1 hour prior to chemo. Do not rub in. Cover with plastic wrap. 05/10/16   Patrici Ranks, MD  metFORMIN (GLUCOPHAGE) 1000 MG tablet TAKE 1 TABLET BY MOUTH TWICE DAILY WITH A MEAL 08/18/16   Cherre Robins, PharmD  Multiple Vitamin (MULTIVITAMIN WITH MINERALS) TABS Take 1 tablet by mouth daily.    Historical Provider, MD  multivitamin-lutein (OCUVITE-LUTEIN) CAPS capsule Take 1 capsule by mouth daily.    Historical Provider, MD  NOVOLOG 100 UNIT/ML injection USE IN INSULIN PUMP AS DIRECTED (MAX DAILY DOSE IS 110 UNITS PER DAY) 10/24/16   [name], MD  omeprazole (PRILOSEC) 40 MG capsule TAKE 1 CAPSULE(40 MG) BY MOUTH DAILY 10/30/16   Baird Cancer, PA-C  ondansetron (ZOFRAN) 8 MG tablet Take 1 tablet (8 mg total) by mouth every 8 (eight) hours as needed for nausea or vomiting. 10/22/16   Baird Cancer, PA-C  PACLitaxel (TAXOL  IV) Inject into the vein. Every 21 days    Historical Provider, MD  polyethylene glycol (MIRALAX / GLYCOLAX) packet Take 17 g by mouth daily.    Historical Provider, MD  senna (SENOKOT) 8.6 MG TABS tablet Take 1 tablet (8.6 mg total) by mouth at bedtime as needed for moderate constipation. 10/15/16   Everitt Amber, MD  valACYclovir (VALTREX) 500 MG tablet TAKE 1 TABLET(500 MG) BY MOUTH TWICE DAILY. START AFTER COMPLETION OF 1000 MG BY MOUTH 3 TIMES DAILY 11/11/16   Patrici Ranks, MD  vitamin E (VITAMIN E) 400 UNIT capsule Take 400 Units by mouth daily.    Historical Provider, MD    Family History Family History  Problem Relation Age of Onset  . Lung cancer Mother     smoker; dx in her 24s  . Leukemia Father   .  Diabetes Paternal Grandmother   . Heart attack Paternal Grandmother   . Diabetes Paternal Grandfather   . Breast cancer Paternal Aunt     dx in her 65s-30s  . Leukemia Paternal Uncle   . Heart attack Maternal Grandfather   . Breast cancer Cousin     maternal first cousin    Social History Social History  Substance Use Topics  . Smoking status: Never Smoker  . Smokeless tobacco: Never Used  . Alcohol use No     Allergies   Avandia [rosiglitazone]; Micronase [glyburide]; and Actos [pioglitazone]   Review of Systems Review of Systems  Constitutional: Positive for appetite change and fever.  HENT: Positive for sore throat.   Respiratory: Positive for choking.   Cardiovascular: Negative for chest pain.  Gastrointestinal: Negative for abdominal pain.  Endocrine: Negative for polydipsia.  Genitourinary: Negative for flank pain.  Musculoskeletal: Negative for back pain.  Neurological: Negative for syncope and numbness.  Psychiatric/Behavioral: Negative for confusion.     Physical Exam Updated Vital Signs BP (!) 138/50 (BP Location: Left Arm)   Pulse 119   Temp (!) 103.2 F (39.6 C) (Oral)   Resp 14   Ht 5\' 3"  (1.6 m)   Wt 200 lb (90.7 kg)   SpO2 95%   BMI  35.43 kg/m   Physical Exam  Constitutional: She appears well-developed.  HENT:  Head: Normocephalic.  Neck:  Mild posterior pharyngeal erethema with mild swelling on right side.  Cardiovascular:  Mild tachycardia  Pulmonary/Chest:  Scattered wheezes  Abdominal: There is no tenderness.  Musculoskeletal: She exhibits no edema.  Neurological: She is alert.  Skin: Skin is warm. Capillary refill takes less than 2 seconds.  Psychiatric: She has a normal mood and affect.     ED Treatments / Results  Labs (all labs ordered are listed, but only abnormal results are displayed) Labs Reviewed  COMPREHENSIVE METABOLIC PANEL - Abnormal; Notable for the following:       Result Value   Glucose, Bld 111 (*)    Calcium 8.4 (*)    All other components within normal limits  CBC WITH DIFFERENTIAL/PLATELET - Abnormal; Notable for the following:    WBC 3.8 (*)    RBC 2.53 (*)    Hemoglobin 8.6 (*)    HCT 26.2 (*)    MCV 103.6 (*)    RDW 18.2 (*)    Platelets 27 (*)    Lymphs Abs 0.3 (*)    All other components within normal limits  INFLUENZA PANEL BY PCR (TYPE A & B) - Abnormal; Notable for the following:    Influenza A By PCR POSITIVE (*)    All other components within normal limits  I-STAT CG4 LACTIC ACID, ED - Abnormal; Notable for the following:    Lactic Acid, Venous 2.25 (*)    All other components within normal limits  CULTURE, BLOOD (ROUTINE X 2)  CULTURE, BLOOD (ROUTINE X 2)  URINE CULTURE  URINALYSIS, ROUTINE W REFLEX MICROSCOPIC    EKG  EKG Interpretation None       Radiology Dg Chest 2 View  Result Date: 11/19/2016 CLINICAL DATA:  Cough and fever. Active chemotherapy for ovarian cancer. EXAM: CHEST  2 VIEW COMPARISON:  CT 09/11/2016.  Chest radiograph 07/30/2015 FINDINGS: Tip of the right chest port in the mid SVC. Minimal right infrahilar opacity may represent focal pneumonia. No additional airspace consolidation. The heart size and mediastinal contours are  unchanged from prior exam. No pleural effusion or pneumothorax.  No acute osseous abnormalities are seen. IMPRESSION: Mild patchy right infrahilar opacity may reflect small focal pneumonia. Electronically Signed   By: Jeb Levering M.D.   On: 11/19/2016 22:56    Procedures Procedures (including critical care time)  Medications Ordered in ED Medications  sodium chloride 0.9 % bolus 1,000 mL (not administered)  ceFEPIme (MAXIPIME) 1 g in dextrose 5 % 50 mL IVPB (1 g Intravenous New Bag/Given 11/19/16 2311)  oseltamivir (TAMIFLU) capsule 75 mg (not administered)  vancomycin (VANCOCIN) IVPB 1000 mg/200 mL premix (not administered)  sodium chloride 0.9 % bolus 1,000 mL (1,000 mLs Intravenous New Bag/Given 11/19/16 2303)     Initial Impression / Assessment and Plan / ED Course  I have reviewed the triage vital signs and the nursing notes.  Pertinent labs & imaging results that were available during my care of the patient were reviewed by me and considered in my medical decision making (see chart for details).  Clinical Course     Patient with fever on chemotherapy. Has sore throat and cough. Negative strep yesterday. X-ray showed possible pneumonia. Start empirically on antibiotics. Lactic acid mildly elevated. Antibiotics given. However the flu test did come back positive. She is high risk and started on Tamiflu. Will admit to internal medicine.   Final Clinical Impressions(s) / ED Diagnoses   Final diagnoses:  Influenza  HCAP (healthcare-associated pneumonia)    New Prescriptions New Prescriptions   No medications on file     Davonna Belling, MD 11/19/16 2327

## 2016-11-19 NOTE — ED Triage Notes (Signed)
Pt is on chemo for breast cancer, states she developed a fever yesterday and saw her pmd for same, started on z-pac yesterday.  Pt continued with fever today with weakness and productive cough with clear sputum

## 2016-11-19 NOTE — ED Notes (Signed)
CRITICAL VALUE ALERT  Critical value received:  PLT 27  Date of notification:  11/19/16  Time of notification:  2256  Critical value read back: YES  Nurse who received alert:  Kendell Bane RN  MD notified (1st page):  PICKERING  Time of first page:  2255  MD notified (2nd page):  Time of second page:  Responding MD:  Alvino Chapel  Time MD responded:  2251

## 2016-11-19 NOTE — Progress Notes (Signed)
ANTIBIOTIC CONSULT NOTE-Preliminary  Pharmacy Consult for vancomycin Indication: pneumonia  Allergies  Allergen Reactions  . Avandia [Rosiglitazone] Other (See Comments)    Legs swelled  . Micronase [Glyburide] Swelling  . Actos [Pioglitazone] Other (See Comments)    Edema / leg swelling    Patient Measurements: Height: 5\' 3"  (160 cm) Weight: 200 lb (90.7 kg) IBW/kg (Calculated) : 52.4 Adjusted Body Weight:   Vital Signs: Temp: 103.2 F (39.6 C) (01/17 2218) Temp Source: Oral (01/17 2218) BP: 138/50 (01/17 2218) Pulse Rate: 119 (01/17 2218)  Labs:  Recent Labs  11/18/16 1642 11/19/16 2223  WBC 3.9 3.8*  HGB  --  8.6*  PLT 40* 27*  CREATININE  --  0.88    Estimated Creatinine Clearance: 69.9 mL/min (by C-G formula based on SCr of 0.88 mg/dL).  No results for input(s): VANCOTROUGH, VANCOPEAK, VANCORANDOM, GENTTROUGH, GENTPEAK, GENTRANDOM, TOBRATROUGH, TOBRAPEAK, TOBRARND, AMIKACINPEAK, AMIKACINTROU, AMIKACIN in the last 72 hours.   Microbiology: Recent Results (from the past 720 hour(s))  Urine culture     Status: Abnormal   Collection Time: 10/30/16 11:30 AM  Result Value Ref Range Status   Specimen Description URINE, RANDOM  Final   Special Requests NONE  Final   Culture >=100,000 COLONIES/mL ESCHERICHIA COLI (A)  Final   Report Status 11/01/2016 FINAL  Final   Organism ID, Bacteria ESCHERICHIA COLI (A)  Final      Susceptibility   Escherichia coli - MIC*    AMPICILLIN <=2 SENSITIVE Sensitive     CEFAZOLIN <=4 SENSITIVE Sensitive     CEFTRIAXONE <=1 SENSITIVE Sensitive     CIPROFLOXACIN <=0.25 SENSITIVE Sensitive     GENTAMICIN <=1 SENSITIVE Sensitive     IMIPENEM <=0.25 SENSITIVE Sensitive     NITROFURANTOIN <=16 SENSITIVE Sensitive     TRIMETH/SULFA <=20 SENSITIVE Sensitive     AMPICILLIN/SULBACTAM <=2 SENSITIVE Sensitive     PIP/TAZO <=4 SENSITIVE Sensitive     Extended ESBL NEGATIVE Sensitive     * >=100,000 COLONIES/mL ESCHERICHIA COLI  Rapid  strep screen (not at [facility])     Status: None   Collection Time: 11/18/16  4:17 PM  Result Value Ref Range Status   Strep Gp A Ag, IA W/Reflex Negative Negative Final  Culture, Group A Strep     Status: None   Collection Time: 11/18/16  4:17 PM  Result Value Ref Range Status   Strep A Culture CANCELED      Comment: Test not performed  Result canceled by the ancillary     Medical History: Past Medical History:  Diagnosis Date  . Abnormal CT of the abdomen 04/30/2016  . Diabetes mellitus without complication ([place])   . Dysrhythmia   . 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 Family history of breast cancer   . GERD (gastroesophageal reflux disease)   . History of blood transfusion   . History of bronchitis   . History of chemotherapy   . History of urinary tract infection   . Hyperlipidemia   . Hypertension   . Low serum vitamin D   . Ovarian cancer ([place]) 05/09/2016  . Shingles     Medications:   (Not in a hospital admission) Scheduled:  . oseltamivir  75 mg Oral Once   Infusions:  . sodium chloride    . [COMPLETED] sodium chloride 1,000 mL (11/19/16 2303)  . vancomycin     PRN:  Anti-infectives    Start     Dose/Rate Route Frequency Ordered Stop  11/19/16 2330  oseltamivir (TAMIFLU) capsule 75 mg     75 mg Oral  Once 11/19/16 2325     11/19/16 2330  vancomycin (VANCOCIN) IVPB 1000 mg/200 mL premix     1,000 mg 200 mL/hr over 60 Minutes Intravenous  Once 11/19/16 2326     11/19/16 2315  ceFEPIme (MAXIPIME) 1 g in dextrose 5 % 50 mL IVPB     1 g 100 mL/hr over 30 Minutes Intravenous  Once 11/19/16 2301 11/19/16 2342      Assessment: Pt admitted for possible PNA, positive flu test. On chemotherapy.  Goal of Therapy:  Vancomycin trough level 15-20 mcg/ml  Plan:  Preliminary review of pertinent patient information completed.  Protocol will be initiated with a one-time dose(s) of vancomycin 1 gram.  Forestine Na clinical pharmacist will complete  review during morning rounds to assess patient and finalize treatment regimen.  Nyra Capes, RPH 11/19/2016,11:43 PM

## 2016-11-20 ENCOUNTER — Encounter (HOSPITAL_COMMUNITY): Payer: Self-pay

## 2016-11-20 ENCOUNTER — Telehealth: Payer: Self-pay | Admitting: *Deleted

## 2016-11-20 DIAGNOSIS — T451X5A Adverse effect of antineoplastic and immunosuppressive drugs, initial encounter: Secondary | ICD-10-CM

## 2016-11-20 DIAGNOSIS — D61810 Antineoplastic chemotherapy induced pancytopenia: Secondary | ICD-10-CM

## 2016-11-20 DIAGNOSIS — A419 Sepsis, unspecified organism: Principal | ICD-10-CM

## 2016-11-20 DIAGNOSIS — J1100 Influenza due to unidentified influenza virus with unspecified type of pneumonia: Secondary | ICD-10-CM

## 2016-11-20 DIAGNOSIS — Z794 Long term (current) use of insulin: Secondary | ICD-10-CM

## 2016-11-20 DIAGNOSIS — E119 Type 2 diabetes mellitus without complications: Secondary | ICD-10-CM

## 2016-11-20 DIAGNOSIS — J189 Pneumonia, unspecified organism: Secondary | ICD-10-CM

## 2016-11-20 LAB — URINALYSIS, ROUTINE W REFLEX MICROSCOPIC
BILIRUBIN URINE: NEGATIVE
Glucose, UA: NEGATIVE mg/dL
Hgb urine dipstick: NEGATIVE
KETONES UR: NEGATIVE mg/dL
NITRITE: NEGATIVE
PROTEIN: NEGATIVE mg/dL
Specific Gravity, Urine: 1.021 (ref 1.005–1.030)
pH: 5 (ref 5.0–8.0)

## 2016-11-20 LAB — CBC WITH DIFFERENTIAL/PLATELET
BASOS ABS: 0 10*3/uL (ref 0.0–0.1)
BASOS PCT: 0 %
EOS ABS: 0 10*3/uL (ref 0.0–0.7)
Eosinophils Relative: 0 %
HEMATOCRIT: 23.8 % — AB (ref 36.0–46.0)
Hemoglobin: 8 g/dL — ABNORMAL LOW (ref 12.0–15.0)
Lymphocytes Relative: 14 %
Lymphs Abs: 0.6 10*3/uL — ABNORMAL LOW (ref 0.7–4.0)
MCH: 34.6 pg — ABNORMAL HIGH (ref 26.0–34.0)
MCHC: 33.6 g/dL (ref 30.0–36.0)
MCV: 103 fL — AB (ref 78.0–100.0)
MONO ABS: 0.5 10*3/uL (ref 0.1–1.0)
MONOS PCT: 11 %
NEUTROS ABS: 3 10*3/uL (ref 1.7–7.7)
Neutrophils Relative %: 75 %
Platelets: DECREASED 10*3/uL (ref 150–400)
RBC: 2.31 MIL/uL — ABNORMAL LOW (ref 3.87–5.11)
RDW: 18.4 % — AB (ref 11.5–15.5)
Smear Review: DECREASED
WBC: 4 10*3/uL (ref 4.0–10.5)

## 2016-11-20 LAB — LACTIC ACID, PLASMA
LACTIC ACID, VENOUS: 1.2 mmol/L (ref 0.5–1.9)
Lactic Acid, Venous: 1.8 mmol/L (ref 0.5–1.9)

## 2016-11-20 LAB — APTT: APTT: 28 s (ref 24–36)

## 2016-11-20 LAB — PROCALCITONIN: Procalcitonin: <0.1 ng/mL

## 2016-11-20 LAB — BASIC METABOLIC PANEL
Anion gap: 8 (ref 5–15)
BUN: 18 mg/dL (ref 6–20)
CO2: 24 mmol/L (ref 22–32)
Calcium: 8.1 mg/dL — ABNORMAL LOW (ref 8.9–10.3)
Chloride: 105 mmol/L (ref 101–111)
Creatinine, Ser: 0.81 mg/dL (ref 0.44–1.00)
GFR calc Af Amer: >60 mL/min (ref >60)
GFR calc non Af Amer: >60 mL/min (ref >60)
Glucose, Bld: 106 mg/dL — ABNORMAL HIGH (ref 65–99)
Potassium: 4.1 mmol/L (ref 3.5–5.1)
Sodium: 137 mmol/L (ref 135–145)

## 2016-11-20 LAB — PROTIME-INR
INR: 1.04
PROTHROMBIN TIME: 13.6 s (ref 11.4–15.2)

## 2016-11-20 LAB — STREP PNEUMONIAE URINARY ANTIGEN: Strep Pneumo Urinary Antigen: NEGATIVE

## 2016-11-20 MED ORDER — HYDROCODONE-ACETAMINOPHEN 10-325 MG PO TABS
1.0000 | ORAL_TABLET | ORAL | Status: DC | PRN
  Administered 2016-11-20 – 2016-11-22 (×9): 1 via ORAL
  Filled 2016-11-20 (×10): qty 1

## 2016-11-20 MED ORDER — INSULIN PUMP
Freq: Three times a day (TID) | SUBCUTANEOUS | Status: DC
  Administered 2016-11-20 (×3): 1 via SUBCUTANEOUS
  Administered 2016-11-21 – 2016-11-22 (×6): via SUBCUTANEOUS
  Filled 2016-11-20: qty 1

## 2016-11-20 MED ORDER — CEFEPIME HCL 2 G IJ SOLR
2.0000 g | Freq: Once | INTRAMUSCULAR | Status: AC
  Administered 2016-11-20: 2 g via INTRAVENOUS
  Filled 2016-11-20: qty 2

## 2016-11-20 MED ORDER — PHENOL 1.4 % MT LIQD
1.0000 | OROMUCOSAL | Status: DC | PRN
  Filled 2016-11-20: qty 177

## 2016-11-20 MED ORDER — LACTATED RINGERS IV SOLN
INTRAVENOUS | Status: DC
  Administered 2016-11-20: 01:00:00 via INTRAVENOUS

## 2016-11-20 MED ORDER — OCUVITE-LUTEIN PO CAPS
1.0000 | ORAL_CAPSULE | Freq: Every day | ORAL | Status: DC
  Filled 2016-11-20 (×2): qty 1

## 2016-11-20 MED ORDER — PANTOPRAZOLE SODIUM 40 MG PO TBEC
40.0000 mg | DELAYED_RELEASE_TABLET | Freq: Every day | ORAL | Status: DC
  Administered 2016-11-21 – 2016-11-22 (×2): 40 mg via ORAL
  Filled 2016-11-20 (×3): qty 1

## 2016-11-20 MED ORDER — ASPIRIN EC 81 MG PO TBEC
81.0000 mg | DELAYED_RELEASE_TABLET | Freq: Every day | ORAL | Status: DC
  Filled 2016-11-20 (×3): qty 1

## 2016-11-20 MED ORDER — OSELTAMIVIR PHOSPHATE 75 MG PO CAPS
75.0000 mg | ORAL_CAPSULE | Freq: Two times a day (BID) | ORAL | Status: DC
  Administered 2016-11-20 – 2016-11-22 (×5): 75 mg via ORAL
  Filled 2016-11-20 (×6): qty 1

## 2016-11-20 MED ORDER — POLYETHYLENE GLYCOL 3350 17 G PO PACK
17.0000 g | PACK | Freq: Every day | ORAL | Status: DC
  Administered 2016-11-21: 17 g via ORAL
  Filled 2016-11-20 (×3): qty 1

## 2016-11-20 MED ORDER — INSULIN ASPART 100 UNIT/ML ~~LOC~~ SOLN: 0.0000 [IU] | SUBCUTANEOUS | Status: DC

## 2016-11-20 MED ORDER — ACETAMINOPHEN 325 MG PO TABS
650.0000 mg | ORAL_TABLET | Freq: Four times a day (QID) | ORAL | Status: DC | PRN
  Administered 2016-11-20 (×2): 650 mg via ORAL
  Filled 2016-11-20 (×2): qty 2

## 2016-11-20 MED ORDER — VALACYCLOVIR HCL 500 MG PO TABS
500.0000 mg | ORAL_TABLET | Freq: Two times a day (BID) | ORAL | Status: DC
  Administered 2016-11-21 – 2016-11-22 (×3): 500 mg via ORAL
  Filled 2016-11-20 (×10): qty 1

## 2016-11-20 MED ORDER — VANCOMYCIN HCL 10 G IV SOLR
1250.0000 mg | Freq: Two times a day (BID) | INTRAVENOUS | Status: DC
  Administered 2016-11-20 – 2016-11-22 (×5): 1250 mg via INTRAVENOUS
  Filled 2016-11-20 (×9): qty 1250

## 2016-11-20 MED ORDER — ATORVASTATIN CALCIUM 40 MG PO TABS
40.0000 mg | ORAL_TABLET | Freq: Every day | ORAL | Status: DC
  Administered 2016-11-21: 40 mg via ORAL
  Filled 2016-11-20 (×2): qty 1

## 2016-11-20 MED ORDER — DIAZEPAM 5 MG PO TABS
2.5000 mg | ORAL_TABLET | ORAL | Status: DC | PRN
  Administered 2016-11-20: 2.5 mg via ORAL
  Filled 2016-11-20: qty 1

## 2016-11-20 MED ORDER — CEFEPIME HCL 2 G IJ SOLR
2.0000 g | Freq: Two times a day (BID) | INTRAMUSCULAR | Status: DC
  Administered 2016-11-20 – 2016-11-22 (×4): 2 g via INTRAVENOUS
  Filled 2016-11-20 (×8): qty 2

## 2016-11-20 MED ORDER — BENAZEPRIL HCL 10 MG PO TABS
10.0000 mg | ORAL_TABLET | Freq: Every day | ORAL | Status: DC
  Filled 2016-11-20: qty 1

## 2016-11-20 MED ORDER — VITAMIN E 180 MG (400 UNIT) PO CAPS
400.0000 [IU] | ORAL_CAPSULE | Freq: Every day | ORAL | Status: DC
  Filled 2016-11-20 (×5): qty 1

## 2016-11-20 MED ORDER — SENNA 8.6 MG PO TABS: 1.0000 | ORAL_TABLET | Freq: Every evening | ORAL | Status: DC | PRN

## 2016-11-20 MED ORDER — DEXTROSE 5 % IV SOLN
INTRAVENOUS | Status: AC
  Filled 2016-11-20: qty 2

## 2016-11-20 NOTE — Telephone Encounter (Signed)
-----   Message from Zannie Cove, LPN sent at [id number]  2:08 PM EST -----   ----- Message ----- From: Chipper Herb, MD Sent: 11/19/2016  11:45 AM To: Everitt Amber, MD, Cleda Clarks Bullins, LPN, #  The CBC had a normal white blood cell count. The hemoglobin was low but stable at 9.4. The platelet count was very low at 40,000. The rapid strep test as already mentioned was negative.

## 2016-11-20 NOTE — Progress Notes (Signed)
ANTIBIOTIC CONSULT NOTE-Preliminary  Pharmacy Consult for cefepime Indication: pneumonia  Allergies  Allergen Reactions  . Avandia [Rosiglitazone] Other (See Comments)    Legs swelled  . Micronase [Glyburide] Swelling  . Actos [Pioglitazone] Other (See Comments)    Edema / leg swelling    Patient Measurements: Height: 5\' 3"  (160 cm) Weight: 204 lb 5 oz (92.7 kg) IBW/kg (Calculated) : 52.4 Adjusted Body Weight:   Vital Signs: Temp: 100.4 F (38 C) (01/18 0035) Temp Source: Oral (01/18 0035) BP: 120/48 (01/18 0035) Pulse Rate: 94 (01/18 0035)  Labs:  Recent Labs  11/18/16 1642 11/19/16 2223  WBC 3.9 3.8*  HGB  --  8.6*  PLT 40* 27*  CREATININE  --  0.88    Estimated Creatinine Clearance: 70.8 mL/min (by C-G formula based on SCr of 0.88 mg/dL).  No results for input(s): VANCOTROUGH, VANCOPEAK, VANCORANDOM, GENTTROUGH, GENTPEAK, GENTRANDOM, TOBRATROUGH, TOBRAPEAK, TOBRARND, AMIKACINPEAK, AMIKACINTROU, AMIKACIN in the last 72 hours.   Microbiology: Recent Results (from the past 720 hour(s))  Urine culture     Status: Abnormal   Collection Time: 10/30/16 11:30 AM  Result Value Ref Range Status   Specimen Description URINE, RANDOM  Final   Special Requests NONE  Final   Culture >=100,000 COLONIES/mL ESCHERICHIA COLI (A)  Final   Report Status 11/01/2016 FINAL  Final   Organism ID, Bacteria ESCHERICHIA COLI (A)  Final      Susceptibility   Escherichia coli - MIC*    AMPICILLIN <=2 SENSITIVE Sensitive     CEFAZOLIN <=4 SENSITIVE Sensitive     CEFTRIAXONE <=1 SENSITIVE Sensitive     CIPROFLOXACIN <=0.25 SENSITIVE Sensitive     GENTAMICIN <=1 SENSITIVE Sensitive     IMIPENEM <=0.25 SENSITIVE Sensitive     NITROFURANTOIN <=16 SENSITIVE Sensitive     TRIMETH/SULFA <=20 SENSITIVE Sensitive     AMPICILLIN/SULBACTAM <=2 SENSITIVE Sensitive     PIP/TAZO <=4 SENSITIVE Sensitive     Extended ESBL NEGATIVE Sensitive     * >=100,000 COLONIES/mL ESCHERICHIA COLI  Rapid  strep screen (not at [facility])     Status: None   Collection Time: 11/18/16  4:17 PM  Result Value Ref Range Status   Strep Gp A Ag, IA W/Reflex Negative Negative Final  Culture, Group A Strep     Status: None   Collection Time: 11/18/16  4:17 PM  Result Value Ref Range Status   Strep A Culture CANCELED      Comment: Test not performed  Result canceled by the ancillary     Medical History: Past Medical History:  Diagnosis Date  . Diabetes mellitus without complication (HCC)    on insulin pump  . Dysrhythmia   . 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 Family history of breast cancer   . GERD (gastroesophageal reflux disease)   . History of blood transfusion   . History of bronchitis   . History of chemotherapy   . History of urinary tract infection   . Hyperlipidemia   . Hypertension   . Low serum vitamin D   . Ovarian cancer ([place]) 05/09/2016  . Shingles     Medications:  Prescriptions Prior to Admission  Medication Sig Dispense Refill Last Dose  . atorvastatin (LIPITOR) 40 MG tablet TAKE 1 TABLET BY MOUTH EVERY DAY 90 tablet 0 Taking  . azithromycin (ZITHROMAX) 250 MG tablet As directed 6 tablet 0   . benazepril (LOTENSIN) 10 MG tablet TAKE 1 TABLET BY MOUTH EVERY DAY 90 tablet 0  Taking  . CARBOPLATIN IV Inject into the vein. Every 21 days   Taking  . dexamethasone (DECADRON) 4 MG tablet The day before chemo take 5 tabs (20mg  total) in the am and 5 tabs (20mg  total) in the pm. The morning of chemo take 5 tabs (20mg  total). 45 tablet 1 Taking  . diazepam (VALIUM) 5 MG tablet Take 0.5 tablets (2.5 mg total) by mouth every 4 (four) hours as needed (spasms). 30 tablet 1 Taking  . enoxaparin (LOVENOX) 40 MG/0.4ML injection Inject 0.4 mLs (40 mg total) into the skin daily. 27 Syringe 0 Taking  . furosemide (LASIX) 20 MG tablet Take 1 tablet (20 mg total) by mouth daily. 90 tablet 1 Taking  . glucagon (GLUCAGON EMERGENCY) 1 MG injection Inject as directed as needed  for low blood glucose if unresponsive. 1 each 0 Taking  . glucosamine-chondroitin 500-400 MG tablet Take 1 tablet by mouth 2 (two) times daily.   Taking  . glucose blood (ONE TOUCH ULTRA TEST) test strip USE TO CHECK BLOOD SUGAR UP TO 5 TIMES A DAY 450 each 2 Taking  . HYDROcodone-acetaminophen (NORCO) 10-325 MG tablet Take 1 tablet by mouth every 4 (four) hours as needed. 90 tablet 0 Taking  . ibuprofen (ADVIL,MOTRIN) 800 MG tablet Take 1 tablet (800 mg total) by mouth every 8 (eight) hours as needed (mild pain). 30 tablet 0 Taking  . Insulin Glargine (BASAGLAR KWIKPEN) 100 UNIT/ML SOPN Inject 55 Units into the skin daily as needed (if unable to use insulin pump).    Taking  . lidocaine-prilocaine (EMLA) cream Apply a quarter size amount to port site 1 hour prior to chemo. Do not rub in. Cover with plastic wrap. 30 g 3 Taking  . metFORMIN (GLUCOPHAGE) 1000 MG tablet TAKE 1 TABLET BY MOUTH TWICE DAILY WITH A MEAL 60 tablet 1 Taking  . Multiple Vitamin (MULTIVITAMIN WITH MINERALS) TABS Take 1 tablet by mouth daily.   Taking  . multivitamin-lutein (OCUVITE-LUTEIN) CAPS capsule Take 1 capsule by mouth daily.   Taking  . NOVOLOG 100 UNIT/ML injection USE IN INSULIN PUMP AS DIRECTED (MAX DAILY DOSE IS 110 UNITS PER DAY) 100 mL 0 Taking  . omeprazole (PRILOSEC) 40 MG capsule TAKE 1 CAPSULE(40 MG) BY MOUTH DAILY 30 capsule 5 Taking  . ondansetron (ZOFRAN) 8 MG tablet Take 1 tablet (8 mg total) by mouth every 8 (eight) hours as needed for nausea or vomiting. 45 tablet 2 Taking  . PACLitaxel (TAXOL IV) Inject into the vein. Every 21 days   Taking  . polyethylene glycol (MIRALAX / GLYCOLAX) packet Take 17 g by mouth daily.   Taking  . senna (SENOKOT) 8.6 MG TABS tablet Take 1 tablet (8.6 mg total) by mouth at bedtime as needed for moderate constipation. 120 each 0 Taking  . valACYclovir (VALTREX) 500 MG tablet TAKE 1 TABLET(500 MG) BY MOUTH TWICE DAILY. START AFTER COMPLETION OF 1000 MG BY MOUTH 3 TIMES DAILY  60 tablet 0 Taking  . vitamin E (VITAMIN E) 400 UNIT capsule Take 400 Units by mouth daily.   Taking   Scheduled:  . aspirin EC  81 mg Oral Daily  . atorvastatin  40 mg Oral Daily  . benazepril  10 mg Oral Daily  . ceFEPime (MAXIPIME) IV  2 g Intravenous Once  . insulin aspart  0-110 Units Subcutaneous UD  . insulin pump   Subcutaneous TID AC, HS, 0200  . multivitamin-lutein  1 capsule Oral Daily  . oseltamivir  75 mg  Oral BID  . pantoprazole  40 mg Oral Daily  . polyethylene glycol  17 g Oral Daily  . sodium chloride  1,000 mL Intravenous Once  . valACYclovir  500 mg Oral BID  . vitamin E  400 Units Oral Daily   Infusions:  . lactated ringers     PRN: diazepam, HYDROcodone-acetaminophen, senna Anti-infectives    Start     Dose/Rate Route Frequency Ordered Stop   11/20/16 0700  ceFEPIme (MAXIPIME) 2 g in dextrose 5 % 50 mL IVPB     2 g 100 mL/hr over 30 Minutes Intravenous  Once 11/20/16 0108     11/20/16 0045  valACYclovir (VALTREX) tablet 500 mg     500 mg Oral 2 times daily 11/20/16 0035     11/20/16 0045  oseltamivir (TAMIFLU) capsule 75 mg     75 mg Oral 2 times daily 11/20/16 0035 11/24/16 2159   11/19/16 2330  oseltamivir (TAMIFLU) capsule 75 mg     75 mg Oral  Once 11/19/16 2325 11/19/16 2358   11/19/16 2330  vancomycin (VANCOCIN) IVPB 1000 mg/200 mL premix     1,000 mg 200 mL/hr over 60 Minutes Intravenous  Once 11/19/16 2326 11/20/16 0101   11/19/16 2315  ceFEPIme (MAXIPIME) 1 g in dextrose 5 % 50 mL IVPB     1 g 100 mL/hr over 30 Minutes Intravenous  Once 11/19/16 2301 11/19/16 2342      Assessment: Pt with HCAP, positive flu. Receiving chemotherapy. Starting cefepime  Plan:  Preliminary review of pertinent patient information completed.  Protocol will be initiated with a one-time dose(s) of cefepime 2 grams at 0700, 8 hours after ED dose.  Forestine Na clinical pharmacist will complete review during morning rounds to assess patient and finalize treatment  regimen.  Emmelia Holdsworth Scarlett, RPH 11/20/2016,1:10 AM

## 2016-11-20 NOTE — Progress Notes (Signed)
Initial Nutrition Assessment  DOCUMENTATION CODES:  Obesity class II    INTERVENTION:  Heart Healthy/ CHO Modified diet   NUTRITION DIAGNOSIS:   Increased nutrient needs related to cancer and cancer related treatments as evidenced by estimated needs.   GOAL:   Patient will meet greater than or equal to 90% of their needs   MONITOR:   PO intake, Labs, Weight trends  REASON FOR ASSESSMENT:   Malnutrition Screening Tool    ASSESSMENT:  Patient is a 66 yo female who presents with fever. She has sepsis related to influenza A and HCAP. Hx of ovarian cancer and has 3 more cycles of chemotherapy to complete.    She is sitting up on edge of her bed eating lunch and talking on her cell phone. Husband is here and says she is eating well and denies changes in appetite. Observed lunch meal 75% eaten.  Her weight has varied slightly but not significantly.  She is obese but weight loss regimen not recommended at this time. If she is interested suggest she pursue when her treatments are completed and she is cleared by MD.   Karen Kays to complete Nutrition-Focused physical exam at this time.    Recent Labs Lab 11/19/16 2223 11/20/16 0246  NA 136 137  K 4.0 4.1  CL 103 105  CO2 23 24  BUN 17 18  CREATININE 0.88 0.81  CALCIUM 8.4* 8.1*  GLUCOSE 111* 106*   Labs/meds: reviewed  Diet Order:  Diet heart healthy/carb modified Room service appropriate? Yes; Fluid consistency: Thin  Skin:  Reviewed, no issues  Last BM:  1/16  Height:   Ht Readings from Last 1 Encounters:  11/20/16 5\' 3"  (1.6 m)    Weight:   Wt Readings from Last 1 Encounters:  11/20/16 204 lb 5 oz (92.7 kg)    Ideal Body Weight:  52.2 kg  BMI:  Body mass index is 36.19 kg/m.  Estimated Nutritional Needs:   Kcal:  1600-1800  Protein:  104-112 gr  Fluid:  1.6-1.8 liters daily  EDUCATION NEEDS:   No education needs identified at this time  Colman Cater MS,RD,CSG,LDN Office: [phone] Pager:  [phone]

## 2016-11-20 NOTE — Progress Notes (Signed)
PROGRESS NOTE  Ellen Hunt [id number] DOB: [birth year] DOA: 11/19/2016 PCP: [name], MD  Brief Narrative: 66 year old woman PMH ovarian cancer currently on chemotherapy, developed fever, seen by PCP 1/16 and started on Zithromax for sore throat. Presented to the emergency department 1/17 with increasing fever, productive cough. Admitted for sepsis, acute influenza, pneumonia in the context of ongoing chemotherapy.   Assessment/Plan 1. Sepsis secondary to pneumonia, influenza A.  Appears to be improving. Continue empiric antibiotics and Tamiflu. 2. Pneumonia. No hypoxia. Improving. Continue treatment as above. 3. Influenza A. Treatment as above. 4. Stage IIIC ovarian/primary peritoneal/fallopian tube cancer with plans for 3 more cycles of chemotherapy. Status post Neulasta on 1/4. 5. Pancytopenia secondary to chemotherapy. Stable. 6. Morbid obesity 7. DM type 2, on insulin pump. Stable.   Overall appears stable. Continue current therapy as above. Possibly home next 48 hours if continues to improve.  DVT prophylaxis: SCDs Code Status: full Family Communication: husband at bedside Disposition Plan: home   Murray Hodgkins, MD  [facility] Direct contact: [phone] --[address]ia [place]  --[email or web address]; password [email or web address]  7PM-7AM contact night coverage as above 11/20/2016, 10:19 AM  LOS: 1 day   Consultants:    Procedures:    Antimicrobials:  Cefepime 1/17 >>  Vancomycin 1/17 >>  Interval history/Subjective: Feels better today. Throat still sore. Still has a cough.  Objective: Vitals:   11/20/16 0005 11/20/16 0035 11/20/16 0606 11/20/16 1000  BP:  (!) 120/48 (!) 112/52   Pulse:  94 83   Resp:   20   Temp: 101.3 F (38.5 C) (!) 100.4 F (38 C) 99.6 F (37.6 C) (!) 102.8 F (39.3 C)  TempSrc: Oral Oral Oral Oral  SpO2:  97% 96%   Weight:  92.7 kg (204 lb 5 oz)    Height:  5\' 3"  (1.6 m)      Intake/Output Summary (Last 24 hours) at  11/20/16 1019 Last data filed at 11/20/16 0734  Gross per 24 hour  Intake           293.75 ml  Output              150 ml  Net           143.75 ml     Filed Weights   11/19/16 2217 11/20/16 0035  Weight: 90.7 kg (200 lb) 92.7 kg (204 lb 5 oz)    Exam:    Constitutional:   Appears calm, comfortable. Sitting on the side of the bed. Respiratory:   Clear to auscultation bilaterally. No wheezes, rales or rhonchi. Normal respiratory effort. Cardiovascular:   Regular rate and rhythm. No murmur, rub or gallop. No lower extremity edema. Psychiatric:   Grossly normal mood and affect. Speech fluent and appropriate.    I have personally reviewed following labs and imaging studies:  Basic metabolic panel, lactic acid unremarkable.  Pancytopenia stable.  Scheduled Meds: . aspirin EC  81 mg Oral Daily  . atorvastatin  40 mg Oral Daily  . benazepril  10 mg Oral Daily  . ceFEPime (MAXIPIME) IV  2 g Intravenous Q12H  . insulin aspart  0-110 Units Subcutaneous UD  . insulin pump   Subcutaneous TID AC, HS, 0200  . multivitamin-lutein  1 capsule Oral Daily  . oseltamivir  75 mg Oral BID  . pantoprazole  40 mg Oral Daily  . polyethylene glycol  17 g Oral Daily  . sodium chloride  1,000 mL Intravenous Once  . valACYclovir  500 mg  Oral BID  . vancomycin  1,250 mg Intravenous Q12H  . vitamin E  400 Units Oral Daily   Continuous Infusions: . 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

## 2016-11-20 NOTE — Telephone Encounter (Signed)
Pt notified of results Verbalizes understanding She is currently in the hospital at [facility]

## 2016-11-20 NOTE — Progress Notes (Signed)
Pharmacy Antibiotic Note  Ellen Hunt is a 66 y.o. female admitted on 11/19/2016 with pneumonia.  Pharmacy has been consulted for Vancomycin and Cefepime  dosing. Initial doses given last evening.  Plan: Vancomycin 1250 mg IV every 12 hours.  Goal trough 15-20 mcg/mL.  Cefepime 2gm IV every 12 hours. Monitor labs, micro and vitals.   Height: 5\' 3"  (160 cm) Weight: 204 lb 5 oz (92.7 kg) IBW/kg (Calculated) : 52.4  Temp (24hrs), Avg:101.5 F (38.6 C), Min:99.6 F (37.6 C), Max:103.2 F (39.6 C)   Recent Labs Lab 11/18/16 1642 11/19/16 2223 11/19/16 2240 11/19/16 2324 11/20/16 0246  WBC 3.9 3.8*  --   --  4.0  CREATININE  --  0.88  --   --  0.81  LATICACIDVEN  --   --  2.25* 1.8 1.2    Estimated Creatinine Clearance: 76.9 mL/min (by C-G formula based on SCr of 0.81 mg/dL).    Allergies  Allergen Reactions  . Avandia [Rosiglitazone] Other (See Comments)    Legs swelled  . Micronase [Glyburide] Swelling  . Actos [Pioglitazone] Other (See Comments)    Edema / leg swelling    Antimicrobials this admission: Vanc 1/17 >>  Cefepime 1/17 >>  Tamilflu 1/17 >>  Dose adjustments this admission: n/a   Microbiology results:  1/17 BCx: pending 1/17 UCx: pending  1/16 Strep: (-)  1/17 Influenza A: (+)  Thank you for allowing pharmacy to be a part of this patient's care.  Pricilla Larsson 11/20/2016 10:55 AM

## 2016-11-21 DIAGNOSIS — C481 Malignant neoplasm of specified parts of peritoneum: Secondary | ICD-10-CM

## 2016-11-21 LAB — BASIC METABOLIC PANEL
Anion gap: 10 (ref 5–15)
BUN: 14 mg/dL (ref 6–20)
CO2: 23 mmol/L (ref 22–32)
CREATININE: 0.81 mg/dL (ref 0.44–1.00)
Calcium: 8.1 mg/dL — ABNORMAL LOW (ref 8.9–10.3)
Chloride: 101 mmol/L (ref 101–111)
GFR calc Af Amer: >60 mL/min (ref >60)
GLUCOSE: 89 mg/dL (ref 65–99)
POTASSIUM: 3.8 mmol/L (ref 3.5–5.1)
SODIUM: 134 mmol/L — AB (ref 135–145)

## 2016-11-21 LAB — URINE CULTURE: Culture: NO GROWTH

## 2016-11-21 MED ORDER — ONDANSETRON HCL 4 MG/2ML IJ SOLN
4.0000 mg | Freq: Four times a day (QID) | INTRAMUSCULAR | Status: DC | PRN
  Administered 2016-11-21: 4 mg via INTRAVENOUS
  Filled 2016-11-21: qty 2

## 2016-11-21 NOTE — Progress Notes (Signed)
Continues on [employer] insulin pump Paradigm 722 while in the hospital.   Current Insulin Pump Settings: ( as of November, 2017)        Basal:  MN to 2am = 1.85                    2am to 8am = 2.20                    8am to 5pm = 2.25                    5pm to MN = 1.90       Insulin to CHO ratio = 8       Insulin Sensitivity = 13  Home BG Monitoring:  Checking 4 to  times a day. 14 day avg = 202 +/- 95  Average total daily insulin = 85.4 +/- 21.2 units Average basal amt = 50.4 Average bolus per day = 35.1  Will continue to monitor blood sugars while in the hospital. Harvel Ricks RN BSN CDE

## 2016-11-21 NOTE — Progress Notes (Signed)
Patient BP 97/47, recheck manually BP 98/60. After talking with patient, patient stated that she took her own blood pressure medicine last night. Education given about taking home medications. Dr. Sarajane Jews notified.

## 2016-11-21 NOTE — Progress Notes (Signed)
PROGRESS NOTE  Ellen Hunt [id number] DOB: 09-15-1951 DOA: 11/19/2016 PCP: [name], MD  Brief Narrative: 66 year old woman PMH ovarian cancer currently on chemotherapy, developed fever, seen by PCP 1/16 and started on Zithromax for sore throat. Presented to the emergency department 1/17 with increasing fever, productive cough. Admitted for sepsis, acute influenza, pneumonia in the context of ongoing chemotherapy.  Assessment/Plan 1. Sepsis secondary to pneumonia, influenza A. Sepsis resolved. 2. Pneumonia. Improving, no hypoxia.  3. Influenza A. Continue Tamiflu.  4. Stage IIIC ovarian/primary peritoneal/fallopian tube cancer with plans for 3 more cycles of chemotherapy. Status post Neulasta on 1/4. 5. Pancytopenia secondary to chemotherapy.   6. Obesity class II 7. DM type 2, on insulin pump.     Improving, afebrile nearly 24 hours. Likely home 1/20 if remains afebrile and continues to improve.  DVT prophylaxis: SCDs Code Status: full Family Communication: husband at bedside Disposition Plan: home   Murray Hodgkins, MD  [facility] Direct contact: [phone] --[address]ia [place]  --[email or web address]; password [email or web address]  7PM-7AM contact night coverage as above 11/21/2016, 1:13 PM  LOS: 2 days   Consultants:    Procedures:    Antimicrobials:  Cefepime 1/17 >>  Vancomycin 1/17 >>  Interval history/Subjective: Still has some cough but overall feels somewhat better. Breathing okay.  Objective: Vitals:   11/20/16 2040 11/20/16 2142 11/21/16 0712 11/21/16 0741  BP: (!) 110/48  (!) 108/59 (!) 97/47  Pulse: 88  87 85  Resp: 20  20 20   Temp: 99.4 F (37.4 C)  98.9 F (37.2 C) 99.2 F (37.3 C)  TempSrc: Oral  Oral Oral  SpO2: 94% 92% 93% 92%  Weight:      Height:        Intake/Output Summary (Last 24 hours) at 11/21/16 1313 Last data filed at 11/21/16 1301  Gross per 24 hour  Intake              780 ml  Output                0 ml  Net               780 ml     Filed Weights   11/19/16 2217 11/20/16 0035  Weight: 90.7 kg (200 lb) 92.7 kg (204 lb 5 oz)    Exam: Constitutional:   Appears comfortable, calm, non-toxic Respiratory:   CTA bilaterally no w/r/r. Normal resp effort. Speaks in full sentences. Cardiovascular:   RRR no m/r/g.  Psychiatric:   Alert, speech fluent and lcear    I have personally reviewed following labs and imaging studies:  Basic metabolic panel unremarkable  BC NGTD  UC NG, final  Scheduled Meds: . aspirin EC  81 mg Oral Daily  . atorvastatin  40 mg Oral Daily  . ceFEPime (MAXIPIME) IV  2 g Intravenous Q12H  . insulin pump   Subcutaneous TID AC, HS, 0200  . multivitamin-lutein  1 capsule Oral Daily  . oseltamivir  75 mg Oral BID  . pantoprazole  40 mg Oral Daily  . polyethylene glycol  17 g Oral Daily  . sodium chloride  1,000 mL Intravenous Once  . valACYclovir  500 mg Oral BID  . vancomycin  1,250 mg Intravenous Q12H  . 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

## 2016-11-22 MED ORDER — CEFUROXIME AXETIL 250 MG PO TABS
500.0000 mg | ORAL_TABLET | Freq: Two times a day (BID) | ORAL | Status: DC
  Administered 2016-11-22: 500 mg via ORAL
  Filled 2016-11-22: qty 2

## 2016-11-22 MED ORDER — CEFUROXIME AXETIL 250 MG PO TABS: 500.0000 mg | ORAL_TABLET | Freq: Two times a day (BID) | ORAL | Status: DC

## 2016-11-22 MED ORDER — OSELTAMIVIR PHOSPHATE 75 MG PO CAPS: 75.0000 mg | ORAL_CAPSULE | Freq: Two times a day (BID) | ORAL | 0 refills | Status: DC

## 2016-11-22 MED ORDER — CEFUROXIME AXETIL 500 MG PO TABS: 500.0000 mg | ORAL_TABLET | Freq: Two times a day (BID) | ORAL | 0 refills | Status: DC

## 2016-11-22 NOTE — Progress Notes (Signed)
Patient loss IV access. Night nurse attempted without success. Patient has port that is not accessed. Dr. Sarajane Jews notified, new order to leave IV access out and not to access port. Patient possible discharge today.

## 2016-11-22 NOTE — Progress Notes (Signed)
PROGRESS NOTE  Ellen Hunt [id number] DOB: Jan 04, 1951 DOA: 11/19/2016 PCP: [name], MD  Brief Narrative: 66 year old woman PMH ovarian cancer currently on chemotherapy, developed fever, seen by PCP 1/16 and started on Zithromax for sore throat. Presented to the emergency department 1/17 with increasing fever, productive cough. Admitted for sepsis, acute influenza, pneumonia in the context of ongoing chemotherapy.  Assessment/Plan 1. Sepsis secondary to pneumonia, influenza A. Sepsis resolved. 2. Pneumonia. Appears resolved, no hypoxia. 3. Influenza A. Improving, afebrile. Complete Tamiflu.  4. Stage IIIC ovarian/primary peritoneal/fallopian tube cancer with plans for 3 more cycles of chemotherapy. Status post Neulasta on 1/4. 5. Pancytopenia secondary to chemotherapy.   6. Obesity class II 7. DM type 2, on insulin pump.     Continues to improve, afebrile close to 48 hours. Change to oral abx and discharge home.  DVT prophylaxis: SCDs Code Status: full Family Communication: husband at bedside Disposition Plan: home   Murray Hodgkins, MD  [facility] Direct contact: [phone] --[address]ia [place]  --[email or web address]; password [email or web address]  7PM-7AM contact night coverage as above 11/22/2016, 12:15 PM  LOS: 3 days   Consultants:    Procedures:    Antimicrobials:  Cefepime 1/17 >> 1/19  Ceftin 1/20 >> 1/23  Vancomycin 1/17 >> 1/19  Interval history/Subjective: Continues to feel better. Still some cough. Wants to go home.  Objective: Vitals:   11/21/16 0741 11/21/16 1511 11/21/16 2200 11/22/16 0652  BP: (!) 97/47 (!) 119/49 (!) 118/59 (!) 119/58  Pulse: 85 77 79 78  Resp: 20 20 20 20   Temp: 99.2 F (37.3 C) 98.6 F (37 C) 98.3 F (36.8 C) 97.4 F (36.3 C)  TempSrc: Oral Oral Oral Oral  SpO2: 92% 99% 98% 98%  Weight:      Height:        Intake/Output Summary (Last 24 hours) at 11/22/16 1215 Last data filed at 11/21/16 1700  Gross per 24 hour    Intake              480 ml  Output                0 ml  Net              480 ml     Filed Weights   11/19/16 2217 11/20/16 0035  Weight: 90.7 kg (200 lb) 92.7 kg (204 lb 5 oz)    Exam: Constitutional:   Appears calm, comfortable Respiratory:   CTA bilaterally, no w/r/r. Normal resp effort. Speaks in full sentences. Cardiovascular:   RRR no m/r/g. Psychiatric:   Alert, speech fluent and clear   I have personally reviewed following labs and imaging studies:  BC NGTD  Scheduled Meds: . aspirin EC  81 mg Oral Daily  . atorvastatin  40 mg Oral Daily  . cefUROXime  500 mg Oral BID WC  . insulin pump   Subcutaneous TID AC, HS, 0200  . multivitamin-lutein  1 capsule Oral Daily  . oseltamivir  75 mg Oral BID  . pantoprazole  40 mg Oral Daily  . polyethylene glycol  17 g Oral Daily  . sodium chloride  1,000 mL Intravenous Once  . valACYclovir  500 mg Oral BID  . 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

## 2016-11-22 NOTE — Discharge Summary (Signed)
Physician Discharge Summary  Ellen Hunt [id number] DOB: 09/13/1951 DOA: 11/19/2016  PCP: [name], MD  Admit date: 11/19/2016 Discharge date: 11/22/2016  Recommendations for Outpatient Follow-up:  1. Resolution of pneumonia and flu  Follow-up Information    [name], Ellen Boxer, MD. Schedule an appointment as soon as possible for a visit in 1 week(s).   Specialty:  Family Medicine Contact information: [place] [address] [address] [phone]          Discharge Diagnoses:  1. Sepsis secondary to pneumonia, flu 2. Pneumonia  3. Influenza A 4. Stage IIIC ovarian/primary peritoneal/fallopian tube cancer 5. Pancytopenia secondary to chemotherapy 6. Obesity class II 7. Diabetes mellitus type 2 on insulin pump  Discharge Condition: improved Disposition: home  Diet recommendation: diabetic diet  Filed Weights   11/19/16 2217 11/20/16 0035  Weight: 90.7 kg (200 lb) 92.7 kg (204 lb 5 oz)    History of present illness:  66 year old woman PMH ovarian cancer currently on chemotherapy, developed fever, seen by PCP 1/16 and started on Zithromax for sore throat. Presented to the emergency department 1/17 with increasing fever, productive cough. Admitted for sepsis, acute influenza, pneumonia in the context of ongoing chemotherapy.  Hospital Course:  Patient was treated with antibiotics and Tamiflu with gradual clinical improvement. No hypoxia, now afebrile almost 48 hours. Hospitalization was uncomplicated. She will complete a course of oral antibiotics as an outpatient. Individual issues as below.  1. Sepsis secondary to pneumonia, influenza A. Sepsis resolved. 2. Pneumonia. Appears resolved, no hypoxia. 3. Influenza A. Improving, afebrile. Complete Tamiflu.  4. Stage IIIC ovarian/primary peritoneal/fallopian tube cancer with plans for 3 more cycles of chemotherapy. Status post Neulasta on 1/4. 5. Pancytopenia secondary to chemotherapy.   6. Obesity class  II 7. DM type 2, on insulin pump.    Antimicrobials:  Cefepime 1/17 >> 1/19  Ceftin 1/20 >> 1/23  Vancomycin 1/17 >> 1/19   Discharge Instructions  Discharge Instructions    Diet Carb Modified    Complete by:  As directed    Discharge instructions    Complete by:  As directed    Call your physician or seek immediate medical attention for fever, shortness of breath or worsening of condition.   Increase activity slowly    Complete by:  As directed      Allergies as of 11/22/2016      Reactions   Avandia [rosiglitazone] Other (See Comments)   Legs swelled   Micronase [glyburide] Swelling   Actos [pioglitazone] Other (See Comments)   Edema / leg swelling      Medication List    TAKE these medications   acetaminophen 500 MG tablet Commonly known as:  TYLENOL Take 1,000 mg by mouth every 6 (six) hours as needed for moderate pain.   atorvastatin 40 MG tablet Commonly known as:  LIPITOR TAKE 1 TABLET BY MOUTH EVERY DAY   BASAGLAR KWIKPEN 100 UNIT/ML Sopn Inject 55 Units into the skin daily as needed (if unable to use insulin pump).   benazepril 10 MG tablet Commonly known as:  LOTENSIN TAKE 1 TABLET BY MOUTH EVERY DAY   CARBOPLATIN IV Inject into the vein. Every 21 days   cefUROXime 500 MG tablet Commonly known as:  CEFTIN Take 1 tablet (500 mg total) by mouth 2 (two) times daily with a meal.   dexamethasone 4 MG tablet Commonly known as:  DECADRON The day before chemo take 5 tabs (20mg  total) in the am and 5 tabs (20mg   total) in the pm. The morning of chemo take 5 tabs (20mg  total).   diazepam 5 MG tablet Commonly known as:  VALIUM Take 0.5 tablets (2.5 mg total) by mouth every 4 (four) hours as needed (spasms).   furosemide 20 MG tablet Commonly known as:  LASIX Take 1 tablet (20 mg total) by mouth daily.   glucagon 1 MG injection Commonly known as:  GLUCAGON EMERGENCY Inject as directed as needed for low blood glucose if unresponsive.    glucosamine-chondroitin 500-400 MG tablet Take 1 tablet by mouth 2 (two) times daily.   glucose blood test strip Commonly known as:  ONE TOUCH ULTRA TEST USE TO CHECK BLOOD SUGAR UP TO 5 TIMES A DAY   HYDROcodone-acetaminophen 10-325 MG tablet Commonly known as:  NORCO Take 1 tablet by mouth every 4 (four) hours as needed.   lidocaine-prilocaine cream Commonly known as:  EMLA Apply a quarter size amount to port site 1 hour prior to chemo. Do not rub in. Cover with plastic wrap.   metFORMIN 1000 MG tablet Commonly known as:  GLUCOPHAGE TAKE 1 TABLET BY MOUTH TWICE DAILY WITH A MEAL What changed:  how much to take  how to take this  when to take this  additional instructions   multivitamin with minerals Tabs tablet Take 1 tablet by mouth daily.   multivitamin-lutein Caps capsule Take 1 capsule by mouth daily.   NOVOLOG 100 UNIT/ML injection Generic drug:  insulin aspart USE IN INSULIN PUMP AS DIRECTED (MAX DAILY DOSE IS 110 UNITS PER DAY)   omeprazole 40 MG capsule Commonly known as:  PRILOSEC TAKE 1 CAPSULE(40 MG) BY MOUTH DAILY   ondansetron 8 MG tablet Commonly known as:  ZOFRAN Take 1 tablet (8 mg total) by mouth every 8 (eight) hours as needed for nausea or vomiting.   oseltamivir 75 MG capsule Commonly known as:  TAMIFLU Take 1 capsule (75 mg total) by mouth 2 (two) times daily.   polyethylene glycol packet Commonly known as:  MIRALAX / GLYCOLAX Take 17 g by mouth daily.   senna 8.6 MG Tabs tablet Commonly known as:  SENOKOT Take 1 tablet (8.6 mg total) by mouth at bedtime as needed for moderate constipation.   TAXOL IV Inject into the vein. Every 21 days   valACYclovir 500 MG tablet Commonly known as:  VALTREX TAKE 1 TABLET(500 MG) BY MOUTH TWICE DAILY. START AFTER COMPLETION OF 1000 MG BY MOUTH 3 TIMES DAILY   vitamin E 400 UNIT capsule Generic drug:  vitamin E Take 400 Units by mouth daily.      Allergies  Allergen Reactions  . Avandia  [Rosiglitazone] Other (See Comments)    Legs swelled  . Micronase [Glyburide] Swelling  . Actos [Pioglitazone] Other (See Comments)    Edema / leg swelling    The results of significant diagnostics from this hospitalization (including imaging, microbiology, ancillary and laboratory) are listed below for reference.    Significant Diagnostic Studies: Dg Chest 2 View  Result Date: 11/19/2016 CLINICAL DATA:  Cough and fever. Active chemotherapy for ovarian cancer. EXAM: CHEST  2 VIEW COMPARISON:  CT 09/11/2016.  Chest radiograph 07/30/2015 FINDINGS: Tip of the right chest port in the mid SVC. Minimal right infrahilar opacity may represent focal pneumonia. No additional airspace consolidation. The heart size and mediastinal contours are unchanged from prior exam. No pleural effusion or pneumothorax. No acute osseous abnormalities are seen. IMPRESSION: Mild patchy right infrahilar opacity may reflect small focal pneumonia. Electronically Signed  By: Jeb Levering M.D.   On: 11/19/2016 22:56    Microbiology: Recent Results (from the past 240 hour(s))  Rapid strep screen (not at [facility])     Status: None   Collection Time: 11/18/16  4:17 PM  Result Value Ref Range Status   Strep Gp A Ag, IA W/Reflex Negative Negative Final  Culture, Group A Strep     Status: None   Collection Time: 11/18/16  4:17 PM  Result Value Ref Range Status   Strep A Culture CANCELED      Comment: Test not performed  Result canceled by the ancillary   Urine culture     Status: None   Collection Time: 11/19/16 11:07 PM  Result Value Ref Range Status   Specimen Description URINE, RANDOM  Final   Special Requests NONE  Final   Culture   Final    NO GROWTH Performed at [facility], [place] [address], [place], [place] [address]    Report Status 11/21/2016 FINAL  Final  Blood Culture (routine x 2)     Status: None (Preliminary result)   Collection Time: 11/19/16 11:15 PM  Result Value Ref Range Status    Specimen Description BLOOD RIGHT ARM  Final   Special Requests BOTTLES DRAWN AEROBIC AND ANAEROBIC 10CC EACH  Final   Culture NO GROWTH 3 DAYS  Final   Report Status PENDING  Incomplete  Blood Culture (routine x 2)     Status: None (Preliminary result)   Collection Time: 11/19/16 11:24 PM  Result Value Ref Range Status   Specimen Description BLOOD RIGHT HAND  Final   Special Requests BOTTLES DRAWN AEROBIC AND ANAEROBIC 8CC EACH  Final   Culture NO GROWTH 3 DAYS  Final   Report Status PENDING  Incomplete     Labs: Basic Metabolic Panel:  Recent Labs Lab 11/19/16 2223 11/20/16 0246 11/21/16 0556  NA 136 137 134*  K 4.0 4.1 3.8  CL 103 105 101  CO2 23 24 23   GLUCOSE 111* 106* 89  BUN 17 18 14   CREATININE 0.88 0.81 0.81  CALCIUM 8.4* 8.1* 8.1*   Liver Function Tests:  Recent Labs Lab 11/19/16 2223  AST 25  ALT 22  ALKPHOS 70  BILITOT 0.5  PROT 6.6  ALBUMIN 3.7   CBC:  Recent Labs Lab 11/18/16 1642 11/19/16 2223 11/20/16 0246  WBC 3.9 3.8* 4.0  NEUTROABS 3.0 2.9 3.0  HGB  --  8.6* 8.0*  HCT 27.6* 26.2* 23.8*  MCV 99* 103.6* 103.0*  PLT 40* 27* PLATELET CLUMPS NOTED ON SMEAR, COUNT APPEARS DECREASED    Principal Problem:   Sepsis (HCC) Active Problems:   Type 2 diabetes mellitus treated with insulin (HCC)   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HCC)   HCAP (healthcare-associated pneumonia)   Influenza with pneumonia   Antineoplastic chemotherapy induced pancytopenia ([place])   Time coordinating discharge: 25 minutes  Signed:  Murray Hodgkins, MD [facility] 11/22/2016, 12:18 PM

## 2016-11-22 NOTE — Progress Notes (Signed)
Patient with orders to be discharge home. Discharge instructions given, patient and spouse verbalized understanding. Patient stable. Patient left in private vehicle with spouse.  

## 2016-11-24 LAB — CULTURE, BLOOD (ROUTINE X 2)
CULTURE: NO GROWTH
Culture: NO GROWTH

## 2016-11-27 ENCOUNTER — Encounter (HOSPITAL_COMMUNITY): Payer: Self-pay | Admitting: Adult Health

## 2016-11-27 ENCOUNTER — Ambulatory Visit (HOSPITAL_COMMUNITY): Payer: BC Managed Care – PPO | Admitting: Adult Health

## 2016-11-27 ENCOUNTER — Encounter (HOSPITAL_BASED_OUTPATIENT_CLINIC_OR_DEPARTMENT_OTHER): Payer: BC Managed Care – PPO | Admitting: Adult Health

## 2016-11-27 ENCOUNTER — Encounter (HOSPITAL_BASED_OUTPATIENT_CLINIC_OR_DEPARTMENT_OTHER): Payer: BC Managed Care – PPO

## 2016-11-27 VITALS — BP 141/56 | HR 78 | Temp 97.7°F | Resp 16 | Wt 202.4 lb

## 2016-11-27 DIAGNOSIS — Z5189 Encounter for other specified aftercare: Secondary | ICD-10-CM | POA: Diagnosis not present

## 2016-11-27 DIAGNOSIS — R935 Abnormal findings on diagnostic imaging of other abdominal regions, including retroperitoneum: Secondary | ICD-10-CM | POA: Diagnosis not present

## 2016-11-27 DIAGNOSIS — D649 Anemia, unspecified: Secondary | ICD-10-CM

## 2016-11-27 DIAGNOSIS — Z5111 Encounter for antineoplastic chemotherapy: Secondary | ICD-10-CM

## 2016-11-27 DIAGNOSIS — C481 Malignant neoplasm of specified parts of peritoneum: Secondary | ICD-10-CM

## 2016-11-27 DIAGNOSIS — E538 Deficiency of other specified B group vitamins: Secondary | ICD-10-CM | POA: Diagnosis not present

## 2016-11-27 LAB — CBC WITH DIFFERENTIAL/PLATELET
BASOS ABS: 0 10*3/uL (ref 0.0–0.1)
BASOS PCT: 0 %
EOS PCT: 0 %
Eosinophils Absolute: 0 10*3/uL (ref 0.0–0.7)
HCT: 25.3 % — ABNORMAL LOW (ref 36.0–46.0)
Hemoglobin: 8.8 g/dL — ABNORMAL LOW (ref 12.0–15.0)
Lymphocytes Relative: 7 %
Lymphs Abs: 0.4 10*3/uL — ABNORMAL LOW (ref 0.7–4.0)
MCH: 35.3 pg — ABNORMAL HIGH (ref 26.0–34.0)
MCHC: 34.8 g/dL (ref 30.0–36.0)
MCV: 101.6 fL — ABNORMAL HIGH (ref 78.0–100.0)
MONO ABS: 0.3 10*3/uL (ref 0.1–1.0)
MONOS PCT: 6 %
Neutro Abs: 4.2 10*3/uL (ref 1.7–7.7)
Neutrophils Relative %: 87 %
PLATELETS: 135 10*3/uL — AB (ref 150–400)
RBC: 2.49 MIL/uL — ABNORMAL LOW (ref 3.87–5.11)
RDW: 18 % — AB (ref 11.5–15.5)
WBC: 4.9 10*3/uL (ref 4.0–10.5)

## 2016-11-27 LAB — COMPREHENSIVE METABOLIC PANEL
ALBUMIN: 3.6 g/dL (ref 3.5–5.0)
ALT: 13 U/L — ABNORMAL LOW (ref 14–54)
ANION GAP: 13 (ref 5–15)
AST: 20 U/L (ref 15–41)
Alkaline Phosphatase: 65 U/L (ref 38–126)
BUN: 19 mg/dL (ref 6–20)
CHLORIDE: 103 mmol/L (ref 101–111)
CO2: 20 mmol/L — ABNORMAL LOW (ref 22–32)
Calcium: 9.7 mg/dL (ref 8.9–10.3)
Creatinine, Ser: 0.82 mg/dL (ref 0.44–1.00)
GFR calc Af Amer: >60 mL/min (ref >60)
Glucose, Bld: 187 mg/dL — ABNORMAL HIGH (ref 65–99)
POTASSIUM: 4.2 mmol/L (ref 3.5–5.1)
Sodium: 136 mmol/L (ref 135–145)
Total Bilirubin: 0.5 mg/dL (ref 0.3–1.2)
Total Protein: 7 g/dL (ref 6.5–8.1)

## 2016-11-27 MED ORDER — DIPHENHYDRAMINE HCL 50 MG/ML IJ SOLN
50.0000 mg | Freq: Once | INTRAMUSCULAR | Status: AC
Start: 2016-11-27 — End: 2016-11-27
  Administered 2016-11-27: 50 mg via INTRAVENOUS

## 2016-11-27 MED ORDER — FAMOTIDINE IN NACL 20-0.9 MG/50ML-% IV SOLN
20.0000 mg | Freq: Once | INTRAVENOUS | Status: AC
  Administered 2016-11-27: 20 mg via INTRAVENOUS

## 2016-11-27 MED ORDER — PACLITAXEL CHEMO INJECTION 300 MG/50ML
175.0000 mg/m2 | Freq: Once | INTRAVENOUS | Status: AC
  Administered 2016-11-27: 366 mg via INTRAVENOUS
  Filled 2016-11-27: qty 61

## 2016-11-27 MED ORDER — PALONOSETRON HCL INJECTION 0.25 MG/5ML
INTRAVENOUS | Status: AC
  Filled 2016-11-27: qty 5

## 2016-11-27 MED ORDER — PEGFILGRASTIM 6 MG/0.6ML ~~LOC~~ PSKT
6.0000 mg | PREFILLED_SYRINGE | Freq: Once | SUBCUTANEOUS | Status: AC
  Administered 2016-11-27: 6 mg via SUBCUTANEOUS
  Filled 2016-11-27: qty 0.6

## 2016-11-27 MED ORDER — PALONOSETRON HCL INJECTION 0.25 MG/5ML
0.2500 mg | Freq: Once | INTRAVENOUS | Status: AC
  Administered 2016-11-27: 0.25 mg via INTRAVENOUS

## 2016-11-27 MED ORDER — HEPARIN SOD (PORK) LOCK FLUSH 100 UNIT/ML IV SOLN
500.0000 [IU] | Freq: Once | INTRAVENOUS | Status: AC | PRN
  Administered 2016-11-27: 500 [IU]
  Filled 2016-11-27: qty 5

## 2016-11-27 MED ORDER — DIPHENHYDRAMINE HCL 50 MG/ML IJ SOLN
INTRAMUSCULAR | Status: AC
  Filled 2016-11-27: qty 1

## 2016-11-27 MED ORDER — SODIUM CHLORIDE 0.9 % IV SOLN
Freq: Once | INTRAVENOUS | Status: AC
  Administered 2016-11-27: 11:00:00 via INTRAVENOUS

## 2016-11-27 MED ORDER — SODIUM CHLORIDE 0.9 % IV SOLN
20.0000 mg | Freq: Once | INTRAVENOUS | Status: AC
  Administered 2016-11-27: 20 mg via INTRAVENOUS
  Filled 2016-11-27: qty 2

## 2016-11-27 MED ORDER — FAMOTIDINE IN NACL 20-0.9 MG/50ML-% IV SOLN
INTRAVENOUS | Status: AC
  Filled 2016-11-27: qty 50

## 2016-11-27 MED ORDER — SODIUM CHLORIDE 0.9 % IV SOLN
750.0000 mg | Freq: Once | INTRAVENOUS | Status: AC
  Administered 2016-11-27: 750 mg via INTRAVENOUS
  Filled 2016-11-27: qty 75

## 2016-11-27 MED ORDER — CYANOCOBALAMIN 1000 MCG/ML IJ SOLN
1000.0000 ug | Freq: Once | INTRAMUSCULAR | Status: AC
  Administered 2016-11-27: 1000 ug via INTRAMUSCULAR
  Filled 2016-11-27: qty 1

## 2016-11-27 NOTE — Progress Notes (Signed)
Ellionna Aurand presents today for injection per MD orders. B12 1000 mcg administered IM in left Upper Arm. Administration without incident. Patient tolerated well.  Tolerated chemo well. Marland KitchenRiki Rusk arrived today for [facility] neulasta on body injector. See MAR for administration details. Injector in place and engaged with green light indicator on flashing. Tolerated application with out problems. Stable and ambulatory on discharge home with husband.

## 2016-11-27 NOTE — Patient Instructions (Signed)
[place] at [facility] Discharge Instructions  RECOMMENDATIONS MADE BY THE CONSULTANT AND ANY TEST RESULTS WILL BE SENT TO YOUR REFERRING PHYSICIAN.  Exam with Ellen Craze, NP. Labs today.   Thank you for choosing [place] at [facility] to provide your oncology and hematology care.  To afford each patient quality time with our provider, please arrive at least 15 minutes before your scheduled appointment time.    If you have a lab appointment with the [facility] please come in thru the  Main Entrance and check in at the main information desk  You need to re-schedule your appointment should you arrive 10 or more minutes late.  We strive to give you quality time with our providers, and arriving late affects you and other patients whose appointments are after yours.  Also, if you no show three or more times for appointments you may be dismissed from the clinic at the providers discretion.     Again, thank you for choosing [facility].  Our hope is that these requests will decrease the amount of time that you wait before being seen by our physicians.       _____________________________________________________________  Should you have questions after your visit to [facility], please contact our office at [phone] between the hours of 8:30 a.m. and 4:30 p.m.  Voicemails left after 4:30 p.m. will not be returned until the following business day.  For prescription refill requests, have your pharmacy contact our office.       Resources For Cancer Patients and their Caregivers ? American Cancer Society: Can assist with transportation, wigs, general needs, runs Look Good Feel Better.        516-233-6080 ? Cancer Care: Provides financial assistance, online support groups, medication/co-pay assistance.  1-800-813-HOPE 970-557-3336) ? [place] Assists [place] cancer patients and  their families through emotional , educational and financial support.  603-810-5743 ? [place] DSS Where to apply for food stamps, Medicaid and utility assistance. 818-297-5759 ? [facility]: Transportation to medical appointments. 539 886 4487 ? Social Security Administration: May apply for disability if have a Stage IV cancer. 307-386-1918 3093211513 ? [employer], Disability and Transit Services: Assists with nutrition, care and transit needs. [place] Support Programs: @10RELATIVEDAYS @ > Cancer Support Group  2nd Tuesday of the month 1pm-2pm, Journey Room  > Creative Journey  3rd Tuesday of the month 1130am-1pm, Journey Room  > Look Good Feel Better  1st Wednesday of the month 10am-12 noon, Journey Room (Call [place] to register [phone])

## 2016-11-29 NOTE — Progress Notes (Signed)
Ellen Hunt:  Medical Oncology/Hematology  PCP:  [name], [place] [id number]  REASON FOR VISIT:  Routine follow-up for Stage IIIC extraovarian primary peritoneal carcinomatosis   CURRENT THERAPY: Adjuvant Carboplatin/Taxol    BRIEF ONCOLOGIC HISTORY:   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[place])   04/29/2016 Imaging    CT abd/pelvis- Extensive omental caking as well as moderate amount of ascites within the abdomen most compatible with peritoneal metastatic disease, of unknown primary. This may potentially be ovarian or a GI in etiology.      04/30/2016 Tumor Marker    CA 125- 7149.0 (H)      05/01/2016 Procedure    US paracentesis- Successful ultrasound-guided paracentesis yielding 1.8 liters of peritoneal fluid.      05/01/2016 Imaging    US pelvis- Both transabdominal and transvaginal sonography are significantly limited by large patient habitus and ascites. Neither uterus or ovaries were visualized on this exam.      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      05/08/2016 Imaging    CT chest- No evidence of metastatic disease in the chest. Peritoneal/omental disease with abdominal ascites in the upper abdomen, incompletely visualized.       05/13/2016 Procedure    Placement of single lumen port a cath via right internal jugular vein. The catheter tip lies at the cavoatrial junction. A power injectable port a cath was placed and is ready for immediate use.      05/15/2016 Procedure    US Paracentesis- 3400 ml yellow colored ascites removed      05/15/2016 - 09/18/2016 Chemotherapy    Carboplatin/Paclitaxel every 21 days x 7 cycles      07/01/2016 Miscellaneous    Genetic Counseling by [name]-  Genetic testing was normal, and did not reveal a deleterious mutation in these genes.       07/08/2016 Imaging    CT CAP- 1. Small volume ascites, significantly decreased. 2. Stable diffuse omental soft tissue caking and diffuse peritoneal thickening along the bilateral paracolic gutters and bilateral pelvic peritoneal reflections, consistent with peritoneal carcinomatosis. 3. Stable asymmetrically enlarged right ovary, which may represent the primary site of ovarian malignancy. 4. No evidence of metastatic disease in the chest. No new sites of metastatic disease in the abdomen or pelvis.      07/09/2016 Miscellaneous    Gyn Onc re-evaluation- modest response to therapy, 3 more cycles of chemotherapy recommended.        09/11/2016 Imaging    CT C/A/P No significant change omental soft tissue caking, consistent with metastatic disease. Mild ascites is decreased since previous study.  Increased calcification along peritoneal surface in pelvic cul-de-sac, consistent with treated peritoneal metastatic disease.  Stable 4.5cm homogeneous right pelvic mass, which favors a uterine fibroid although right ovarian neoplasm cannot definitely be excluded.  No new or progressive metastatic disease identified. No evidence of metastatic disease within the thorax.       10/14/2016 Procedure    Robotic-assisted laparoscopic total hysterectomy with bilateral salpingoophorectomy, ex lap omentectomy, radical tumor debulking by Dr. Denman George      10/17/2016 Pathology Results    Diagnosis 1. 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 - CERVIX AND ENDOMETRIUM FREE OF TUMOR. - SEE ONCOLOGY TABLE AND COMMENT. 2.  Soft tissue, biopsy, umbilical nodule - HIGH GRADE SEROUS CARCINOMA. 3. Omentum, resection for tumor - HIGH GRADE SEROUS CARCINOMA, 33 CM.      HISTORY OF PRESENT ILLNESS:  Ellen Hunt 66 y.o. female returns for followup of Stage IIIC primary peritoneal carcinomatosis with  slow but progressive response to neoadjuvant chemotherapy consisting of Carboplatin/Paclitaxel x 7 cycles (05/15/2016- 09/18/2016) followed by optimal cytoreductive surgery by Dr. Denman George on 10/14/2016 with R1 residual disease.  Chart is reviewed and Dr. Serita Grit Op Note, surgical pathology, and follow-up note are reviewed in detail:   Ms. Ellen Hunt  is a 66 y.o.  year old with stage IIIC ovarian/primary peritoneal/fallopian tube cancer with slow but progressive response to neoadjuvant chemotherapy with carboplatin and paclitaxel. S/p 7 cycles adjuvant chemotherapy with carb/taxol. S/p interval optimal cytoreduction with hysterectomy, BSO, omentectomy on 10/14/16 with R1 residual disease. Healing well postoperatively with no issues. She is cleared to resume chemotherapy. I am recommending at least 3 additional cycles of adjuvant chemotherapy with carboplatin and paclitaxel. I would recommend repeat imaging and CA 125 at that time. If she is disease free, therapy can stop at that time, however, if disease persists, she will require ongoing therapy. I discussed with Ellen Hunt that due to her modest and slow response to primary therapy, prognosis is poorer, and she has a high probability for developing platinum resistance.   INTERVAL HISTORY:  Ms. Ellen Hunt is here and seen in infusion area with her husband.    She was recently admitted to the hospital from 11/19/16-11/22/16 for influenza, sepsis, and HCAP.  She denies any fevers or chills since discharge. Overall she feels well.  She has a mild residual, dry cough.  She has healed well from her recent TAH/BSO/omentectomy surgery; she keeps a small band-aid on a very small scab on her midline incision.  Denies any open wounds.    Denies peripheral neuropathy.  She has bilateral hand joint pains, which make it difficult at times to connect/disconnect her insulin pump, etc.  Arthralgias began with chemotherapy and have not worsened significantly.   Denies  mouth sores, shortness of breath, diarrhea, constipation, headaches, or dizziness.  Endorses mild fatigue.    REVIEW OF SYSTEMS:  Review of Systems  Constitutional: Negative.  Negative for chills and fever.       Mild fatigue   HENT: Negative.  Negative for sore throat.   Eyes: Negative.   Respiratory: Positive for cough (mild dry cough). Negative for shortness of breath and wheezing.   Cardiovascular: Negative.  Negative for chest pain and leg swelling.  Gastrointestinal: Negative.  Negative for abdominal pain, blood in stool, constipation, diarrhea, melena, nausea and vomiting.  Genitourinary: Negative.  Negative for hematuria.  Musculoskeletal: Positive for joint pain (hands ).  Skin: Negative.   Neurological: Negative.  Negative for dizziness, weakness and headaches.  Endo/Heme/Allergies: Negative.   Psychiatric/Behavioral: Negative.    A 14-point ROS completed and is otherwise negative except as noted above.    PAST MEDICAL/SURGICAL HISTORY:  Past Medical History:  Diagnosis Date  . Diabetes mellitus without complication (HCC)    on insulin pump  . Dysrhythmia   . 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[place]) 05/09/2016  . Family history of breast cancer   . GERD (gastroesophageal reflux disease)   . History of blood transfusion   . History of bronchitis   . History of chemotherapy   . History of urinary tract infection   . Hyperlipidemia   . Hypertension   . Low serum vitamin  D   . Ovarian cancer ([place]) 05/09/2016  . Shingles    Past Surgical History:  Procedure Laterality Date  . CESAREAN SECTION    . DEBULKING N/A 10/14/2016   Procedure: DEBULKING;  Surgeon: Everitt Amber, MD;  Location: [facility];  Service: Gynecology;  Laterality: N/A;  . LAPAROTOMY WITH STAGING N/A 10/14/2016   Procedure: LAPAROTOMY WITH OMENTECTOMY AND TUMOR DEBULGING;  Surgeon: Everitt Amber, MD;  Location: [facility];  Service: Gynecology;  Laterality: N/A;  . LUMBAR FUSION  08/21/15   L3-L4 Dr. Timmothy Euler  . OMENTECTOMY N/A 10/14/2016   Procedure: OMENTECTOMY;  Surgeon: Everitt Amber, MD;  Location: [facility];  Service: Gynecology;  Laterality: N/A;  . ROBOTIC ASSISTED TOTAL HYSTERECTOMY WITH BILATERAL SALPINGO OOPHERECTOMY Bilateral 10/14/2016   Procedure: XI ROBOTIC ASSISTED TOTAL LAPARSCOPIC  HYSTERECTOMY WITH BILATERAL SALPINGO OOPHORECTOMY;  Surgeon: Everitt Amber, MD;  Location: [facility];  Service: Gynecology;  Laterality: Bilateral;   FAMILY HISTORY:  Family History  Problem Relation Age of Onset  . Lung cancer Mother     smoker; dx in her 29s  . Leukemia Father   . Diabetes Paternal Grandmother   . Heart attack Paternal Grandmother   . Diabetes Paternal Grandfather   . Breast cancer Paternal Aunt     dx in her 16s-30s  . Leukemia Paternal Uncle   . Heart attack Maternal Grandfather   . Breast cancer Cousin     maternal first cousin   SOCIAL HISTORY:  Social History   Social History  . Marital status: Married    Spouse name: Gershon Mussel  . Number of children: 2  . Years of education: N/A   Occupational History  . retired    Social History Main Topics  . Smoking status: Never Smoker  . Smokeless tobacco: Never Used  . Alcohol use No  . Drug use: No  . Sexual activity: Not Asked     Comment: married   Other Topics Concern  . None   Social History Narrative  . None     PHYSICAL EXAMINATION  ECOG PERFORMANCE STATUS: 1 - Symptomatic but completely ambulatory  VITALS BP 141/56  HR 78  Resp 16  Temp 97.7  O2 sat 99%  Weight 202 lb 6.4 oz    GENERAL: Alert, no distress, well nourished, well developed, comfortable, cooperative, smiling and accompanied by husband. SKIN: skin color, texture, turgor are normal, no rashes or significant lesions HEAD: Normocephalic, No masses, lesions, tenderness or abnormalities EYES: Normal, Conjunctiva clear; Sclerae anicteric EARS: External ears normal OROPHARYNX:Lips, buccal mucosa, and tongue normal and mucous membranes are  moist.  NECK: Supple, trachea midline. No cervical lymphadenopathy.  BREAST: Not examined LUNGS: Clear to auscultation. No wheeze, rhonchi, or rales.  HEART: Regular rate & rhythm, no murmurs and no gallops ABDOMEN: Abdomen soft, non-tender, obese, normal bowel sounds and surgical site completely healed; Band-Aid to upper portion of midline surgical scar where small scab remains; no redness/drainage.  Small incisions to abdomen s/p robotic surgery are completely healed.   BACK: Back symmetric, no curvature. EXTREMITIES: Less then 2 second capillary refill, no joint deformities, effusion, or inflammation, no skin discoloration, no cyanosis, no edema.  NEURO: Alert & oriented x 3 with fluent speech, no focal motor/sensory deficits.   LABORATORY DATA: I have reviewed available laboratory studies.  CBC    Component Value Date/Time   WBC 4.9 11/27/2016 0929   RBC 2.49 (L) 11/27/2016 0929   HGB 8.8 (L) 11/27/2016 0929   HCT 25.3 (  L) 11/27/2016 0929   HCT 27.6 (L) 11/18/2016 1642   PLT 135 (L) 11/27/2016 0929   PLT 40 (LL) 11/18/2016 1642   MCV 101.6 (H) 11/27/2016 0929   MCV 99 (H) 11/18/2016 1642   MCH 35.3 (H) 11/27/2016 0929   MCHC 34.8 11/27/2016 0929   RDW 18.0 (H) 11/27/2016 0929   RDW 19.5 (H) 11/18/2016 1642   LYMPHSABS 0.4 (L) 11/27/2016 0929   LYMPHSABS 0.4 (L) 11/18/2016 1642   MONOABS 0.3 11/27/2016 0929   EOSABS 0.0 11/27/2016 0929   EOSABS 0.1 11/18/2016 1642   BASOSABS 0.0 11/27/2016 0929   BASOSABS 0.0 11/18/2016 1642      Chemistry      Component Value Date/Time   NA 136 11/27/2016 0929   NA 139 02/06/2016 1148   K 4.2 11/27/2016 0929   CL 103 11/27/2016 0929   CO2 20 (L) 11/27/2016 0929   BUN 19 11/27/2016 0929   BUN 21 02/06/2016 1148   CREATININE 0.82 11/27/2016 0929      Component Value Date/Time   CALCIUM 9.7 11/27/2016 0929   ALKPHOS 65 11/27/2016 0929   AST 20 11/27/2016 0929   ALT 13 (L) 11/27/2016 0929   BILITOT 0.5 11/27/2016 0929   BILITOT  0.3 07/02/2015 1005      Results for Ellen Hunt, Ellen Hunt (MRN [id number])   Ref. Range 06/26/2016 09:30 07/17/2016 08:49 08/07/2016 09:13 08/28/2016 09:18 09/18/2016 14:48  CA 125 Latest Ref Range: 0.0 - 38.1 U/mL 8,811.0 (H) 5,202.0 (H) 2,751.0 (H) 1,835.0 (H) 1,445.0 (H)    PENDING LABS:   RADIOGRAPHIC STUDIES:  Dg Chest 2 View  Result Date: 11/19/2016 CLINICAL DATA:  Cough and fever. Active chemotherapy for ovarian cancer. EXAM: CHEST  2 VIEW COMPARISON:  CT 09/11/2016.  Chest radiograph 07/30/2015 FINDINGS: Tip of the right chest port in the mid SVC. Minimal right infrahilar opacity may represent focal pneumonia. No additional airspace consolidation. The heart size and mediastinal contours are unchanged from prior exam. No pleural effusion or pneumothorax. No acute osseous abnormalities are seen. IMPRESSION: Mild patchy right infrahilar opacity may reflect small focal pneumonia. Electronically Signed   By: Jeb Levering M.D.   On: 11/19/2016 22:56     PATHOLOGY:    ASSESSMENT AND PLAN:  Ellen Hunt is 66 y.o. female with Stage IIIC extraovarian primary peritoneal carcinomatosis; s/p neoadjuvant chemo with Carbo/Taxol x 8 cycles, followed by robotic TAH/BSO with omentectomy on 10/14/16.   Stage IIIC extraovarian primary peritoneal carcinomatosis:  -She will begin adjuvant chemotherapy today with cycle #9 Carbo/Taxol. Labs are adequate to proceed with treatment today.  She will have 1 more cycle (total of 10) before restaging imaging.  If no residual disease is present, then we can stop treatment.  If persistent disease, additional treatment will be considered.    Recent hospital admission: -Her only residual symptoms from recent hospitalization for influenza, HCAP, and sepsis is a dry cough.  No fever/chills.    Anemia:  -Hemoglobin stable at 8.8. RDW 18 and MCH 35.3.  She is mildly fatigued, but otherwise asymptomatic.  We will continue to monitor and transfuse as needed.    ORDERS  PLACED FOR THIS ENCOUNTER: No orders of the defined types were placed in this encounter.   MEDICATIONS PRESCRIBED THIS ENCOUNTER: No orders of the defined types were placed in this encounter.    THERAPY PLAN:  2 more cycles of Carboplatin/Paclitaxel (total 10 cycles) followed by restaging imaging.  If disease-free, then discontinuation of therapy is recommended, however, if  disease persists, ongoing treatment will be recommended.   Dispo:  -Return to cancer center in 3 weeks for cycle #10 Carbo/Taxol.    All questions were answered. The patient knows to call the clinic with any problems, questions or concerns. We can certainly see the patient much sooner if necessary.  Patient and plan discussed with Dr. Ancil Linsey and she is in agreement with the aforementioned.   Mike Craze, NP [place] [phone]

## 2016-12-15 ENCOUNTER — Telehealth (HOSPITAL_COMMUNITY): Payer: Self-pay | Admitting: *Deleted

## 2016-12-15 ENCOUNTER — Other Ambulatory Visit (HOSPITAL_COMMUNITY): Payer: Self-pay | Admitting: Hematology & Oncology

## 2016-12-15 DIAGNOSIS — T451X5A Adverse effect of antineoplastic and immunosuppressive drugs, initial encounter: Principal | ICD-10-CM

## 2016-12-15 DIAGNOSIS — D61810 Antineoplastic chemotherapy induced pancytopenia: Secondary | ICD-10-CM

## 2016-12-15 DIAGNOSIS — C481 Malignant neoplasm of specified parts of peritoneum: Secondary | ICD-10-CM

## 2016-12-15 DIAGNOSIS — G620 Drug-induced polyneuropathy: Secondary | ICD-10-CM

## 2016-12-15 NOTE — Telephone Encounter (Signed)
Received call for refill on valtrex. Chart checked , reviewed with PA and refilled.

## 2016-12-17 ENCOUNTER — Other Ambulatory Visit: Payer: Self-pay | Admitting: Family Medicine

## 2016-12-17 ENCOUNTER — Other Ambulatory Visit (HOSPITAL_COMMUNITY): Payer: Self-pay | Admitting: Oncology

## 2016-12-17 DIAGNOSIS — C569 Malignant neoplasm of unspecified ovary: Secondary | ICD-10-CM

## 2016-12-18 ENCOUNTER — Other Ambulatory Visit (HOSPITAL_COMMUNITY): Payer: Self-pay | Admitting: Oncology

## 2016-12-18 ENCOUNTER — Encounter (HOSPITAL_COMMUNITY): Payer: BC Managed Care – PPO | Attending: Oncology | Admitting: Oncology

## 2016-12-18 ENCOUNTER — Ambulatory Visit (HOSPITAL_COMMUNITY)
Admission: RE | Admit: 2016-12-18 | Discharge: 2016-12-18 | Disposition: A | Discharge status: Home / Self Care | Payer: BC Managed Care – PPO | Source: Ambulatory Visit | Attending: Oncology | Admitting: Oncology

## 2016-12-18 ENCOUNTER — Encounter (HOSPITAL_BASED_OUTPATIENT_CLINIC_OR_DEPARTMENT_OTHER): Payer: BC Managed Care – PPO

## 2016-12-18 ENCOUNTER — Encounter (HOSPITAL_COMMUNITY): Payer: Self-pay

## 2016-12-18 VITALS — BP 151/63 | HR 100 | Temp 97.7°F | Resp 18 | Wt 211.0 lb

## 2016-12-18 DIAGNOSIS — R600 Localized edema: Secondary | ICD-10-CM | POA: Diagnosis not present

## 2016-12-18 DIAGNOSIS — D6481 Anemia due to antineoplastic chemotherapy: Secondary | ICD-10-CM | POA: Diagnosis not present

## 2016-12-18 DIAGNOSIS — C481 Malignant neoplasm of specified parts of peritoneum: Secondary | ICD-10-CM

## 2016-12-18 DIAGNOSIS — R935 Abnormal findings on diagnostic imaging of other abdominal regions, including retroperitoneum: Secondary | ICD-10-CM | POA: Diagnosis not present

## 2016-12-18 DIAGNOSIS — C569 Malignant neoplasm of unspecified ovary: Secondary | ICD-10-CM

## 2016-12-18 DIAGNOSIS — T451X5A Adverse effect of antineoplastic and immunosuppressive drugs, initial encounter: Secondary | ICD-10-CM

## 2016-12-18 LAB — COMPREHENSIVE METABOLIC PANEL
ALT: 24 U/L (ref 14–54)
ANION GAP: 11 (ref 5–15)
AST: 30 U/L (ref 15–41)
Albumin: 3.6 g/dL (ref 3.5–5.0)
Alkaline Phosphatase: 95 U/L (ref 38–126)
BUN: 28 mg/dL — ABNORMAL HIGH (ref 6–20)
CHLORIDE: 103 mmol/L (ref 101–111)
CO2: 21 mmol/L — AB (ref 22–32)
Calcium: 9.3 mg/dL (ref 8.9–10.3)
Creatinine, Ser: 0.84 mg/dL (ref 0.44–1.00)
GFR calc non Af Amer: >60 mL/min (ref >60)
Glucose, Bld: 292 mg/dL — ABNORMAL HIGH (ref 65–99)
Potassium: 4.6 mmol/L (ref 3.5–5.1)
SODIUM: 135 mmol/L (ref 135–145)
Total Bilirubin: 0.3 mg/dL (ref 0.3–1.2)
Total Protein: 6.6 g/dL (ref 6.5–8.1)

## 2016-12-18 LAB — CBC WITH DIFFERENTIAL/PLATELET
BASOS PCT: 0 %
Basophils Absolute: 0 10*3/uL (ref 0.0–0.1)
EOS ABS: 0 10*3/uL (ref 0.0–0.7)
Eosinophils Relative: 0 %
HCT: 22.3 % — ABNORMAL LOW (ref 36.0–46.0)
Hemoglobin: 7.5 g/dL — ABNORMAL LOW (ref 12.0–15.0)
Lymphocytes Relative: 3 %
Lymphs Abs: 0.4 10*3/uL — ABNORMAL LOW (ref 0.7–4.0)
MCH: 35.7 pg — AB (ref 26.0–34.0)
MCHC: 33.6 g/dL (ref 30.0–36.0)
MCV: 106.2 fL — ABNORMAL HIGH (ref 78.0–100.0)
MONO ABS: 0.2 10*3/uL (ref 0.1–1.0)
Monocytes Relative: 2 %
NEUTROS PCT: 95 %
Neutro Abs: 11.8 10*3/uL — ABNORMAL HIGH (ref 1.7–7.7)
PLATELETS: DECREASED 10*3/uL (ref 150–400)
RBC: 2.1 MIL/uL — ABNORMAL LOW (ref 3.87–5.11)
RDW: 18.5 % — ABNORMAL HIGH (ref 11.5–15.5)
WBC: 12.4 10*3/uL — ABNORMAL HIGH (ref 4.0–10.5)

## 2016-12-18 LAB — PREPARE RBC (CROSSMATCH)

## 2016-12-18 LAB — PLATELET COUNT: PLATELETS: 104 10*3/uL — AB (ref 150–400)

## 2016-12-18 MED ORDER — HYDROCODONE-ACETAMINOPHEN 10-325 MG PO TABS: 1.0000 | ORAL_TABLET | ORAL | 0 refills | Status: DC | PRN

## 2016-12-18 MED ORDER — SODIUM CHLORIDE 0.9% FLUSH
20.0000 mL | INTRAVENOUS | Status: DC | PRN
  Administered 2016-12-18: 20 mL via INTRAVENOUS
  Filled 2016-12-18: qty 20

## 2016-12-18 MED ORDER — BENAZEPRIL HCL 10 MG PO TABS: 10.0000 mg | ORAL_TABLET | Freq: Every day | ORAL | 0 refills | Status: DC

## 2016-12-18 MED ORDER — ATORVASTATIN CALCIUM 40 MG PO TABS: 40.0000 mg | ORAL_TABLET | Freq: Every day | ORAL | 0 refills | Status: DC

## 2016-12-18 MED ORDER — DIAZEPAM 5 MG PO TABS: 2.5000 mg | ORAL_TABLET | ORAL | 1 refills | Status: DC | PRN

## 2016-12-18 MED ORDER — HEPARIN SOD (PORK) LOCK FLUSH 100 UNIT/ML IV SOLN
500.0000 [IU] | Freq: Once | INTRAVENOUS | Status: AC
  Administered 2016-12-18: 500 [IU] via INTRAVENOUS

## 2016-12-18 NOTE — Patient Instructions (Signed)
[place] at [facility] Discharge Instructions  RECOMMENDATIONS MADE BY THE CONSULTANT AND ANY TEST RESULTS WILL BE SENT TO YOUR REFERRING PHYSICIAN.  You were seen today by Dr. Barron Schmid We will do an ultrasound of bilateral lower extremities today Have CT scan before next visit Follow up in 4 weeks  See Amy up front for appointments   Thank you for choosing [place] at [facility] to provide your oncology and hematology care.  To afford each patient quality time with our provider, please arrive at least 15 minutes before your scheduled appointment time.    If you have a lab appointment with the [facility] please come in thru the  Main Entrance and check in at the main information desk  You need to re-schedule your appointment should you arrive 10 or more minutes late.  We strive to give you quality time with our providers, and arriving late affects you and other patients whose appointments are after yours.  Also, if you no show three or more times for appointments you may be dismissed from the clinic at the providers discretion.     Again, thank you for choosing [facility].  Our hope is that these requests will decrease the amount of time that you wait before being seen by our physicians.       _____________________________________________________________  Should you have questions after your visit to [facility], please contact our office at [phone] between the hours of 8:30 a.m. and 4:30 p.m.  Voicemails left after 4:30 p.m. will not be returned until the following business day.  For prescription refill requests, have your pharmacy contact our office.       Resources For Cancer Patients and their Caregivers ? American Cancer Society: Can assist with transportation, wigs, general needs, runs Look Good Feel Better.        (902)084-5188 ? Cancer Care: Provides financial assistance, online support  groups, medication/co-pay assistance.  1-800-813-HOPE 470-032-1204) ? [place] Assists [place] cancer patients and their families through emotional , educational and financial support.  574-460-4777 ? [place] DSS Where to apply for food stamps, Medicaid and utility assistance. (872)745-3848 ? [employer]: Transportation to medical appointments. 815 043 6436 ? Social Security Administration: May apply for disability if have a Stage IV cancer. 873-619-4207 337-496-5187 ? [employer], Disability and Transit Services: Assists with nutrition, care and transit needs. [place] Support Programs: @10RELATIVEDAYS @ > Cancer Support Group  2nd Tuesday of the month 1pm-2pm, Journey Room  > Creative Journey  3rd Tuesday of the month 1130am-1pm, Journey Room  > Look Good Feel Better  1st Wednesday of the month 10am-12 noon, Journey Room (Call [place] to register [phone])

## 2016-12-18 NOTE — Patient Instructions (Signed)
[place] at [facility] Discharge Instructions  RECOMMENDATIONS MADE BY THE CONSULTANT AND ANY TEST RESULTS WILL BE SENT TO YOUR REFERRING PHYSICIAN.  Chemo tx held today. Pt to receive blood transfusion tomorrow. Portacath flushed with labs drawn. Follow-up as scheduled. Call clinic for any questions or concerns  Thank you for choosing [place] at [facility] to provide your oncology and hematology care.  To afford each patient quality time with our provider, please arrive at least 15 minutes before your scheduled appointment time.    If you have a lab appointment with the [facility] please come in thru the  Main Entrance and check in at the main information desk  You need to re-schedule your appointment should you arrive 10 or more minutes late.  We strive to give you quality time with our providers, and arriving late affects you and other patients whose appointments are after yours.  Also, if you no show three or more times for appointments you may be dismissed from the clinic at the providers discretion.     Again, thank you for choosing [facility].  Our hope is that these requests will decrease the amount of time that you wait before being seen by our physicians.       _____________________________________________________________  Should you have questions after your visit to [facility], please contact our office at [phone] between the hours of 8:30 a.m. and 4:30 p.m.  Voicemails left after 4:30 p.m. will not be returned until the following business day.  For prescription refill requests, have your pharmacy contact our office.       Resources For Cancer Patients and their Caregivers ? American Cancer Society: Can assist with transportation, wigs, general needs, runs Look Good Feel Better.        515-342-2903 ? Cancer Care: Provides financial assistance, online support groups, medication/co-pay  assistance.  1-800-813-HOPE 306-201-2640) ? [place] Assists [place] cancer patients and their families through emotional , educational and financial support.  (450)809-3502 ? [place] DSS Where to apply for food stamps, Medicaid and utility assistance. 2286663869 ? [employer]: Transportation to medical appointments. 8201209312 ? Social Security Administration: May apply for disability if have a Stage IV cancer. 986-123-0325 (215)485-3501 ? [employer], Disability and Transit Services: Assists with nutrition, care and transit needs. [place] Support Programs: @10RELATIVEDAYS @ > Cancer Support Group  2nd Tuesday of the month 1pm-2pm, Journey Room  > Creative Journey  3rd Tuesday of the month 1130am-1pm, Journey Room  > Look Good Feel Better  1st Wednesday of the month 10am-12 noon, Journey Room (Call [place] to register [phone])

## 2016-12-18 NOTE — Addendum Note (Signed)
Addended by: Twana First on: 12/18/2016 01:47 PM   Modules accepted: Orders, SmartSet

## 2016-12-18 NOTE — Progress Notes (Signed)
Labs drawn from port today and reviewed with Dr. Talbert Cage. Platelet level was drawn twice due to specimen clumping.. Chemo tx to be held today and rescheduled for next week and pt to received 2 units of blood tomorrow per MD orders.Informed pt of this information and understanding verbalized. Port saline locked and flushed per protocol for use tomorrow.Pt discharged self ambulatory in satisfactory condition accompanied by her husband

## 2016-12-18 NOTE — Progress Notes (Addendum)
[place], [place] [id number]  No diagnosis found.  CURRENT THERAPY: S/P optimal cytoreduction with hysterectomy, BSO, omentectomy on 10/14/2016 with R1 residual disease after undergoing 7 cycles of chemotherapy (Carboplatin/Paclitaxel).  INTERVAL HISTORY: Ellen Hunt 66 y.o. 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 Denman George on 10/14/2016 with R1 residual disease.    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 This may potentially be ovarian or a GI in etiology.      04/30/2016 Tumor Marker    CA 125- 7149.0 (H)      05/01/2016 Procedure    US paracentesis- Successful ultrasound-guided paracentesis yielding 1.8 liters of peritoneal fluid.      05/01/2016 Imaging    US pelvis- Both transabdominal and transvaginal sonography are significantly limited by large patient habitus and ascites. Neither uterus or ovaries were visualized on this exam.      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      05/08/2016 Imaging    CT chest- No evidence of metastatic disease in the chest. Peritoneal/omental disease with abdominal ascites in the upper abdomen, incompletely visualized.       05/13/2016 Procedure    Placement of single lumen port a cath via right internal jugular vein. The catheter tip lies at the cavoatrial junction. A power injectable port a cath was placed and is ready for immediate use.      05/15/2016 Procedure    US  Paracentesis- 3400 ml yellow colored ascites removed      05/15/2016 - 09/18/2016 Chemotherapy    Carboplatin/Paclitaxel every 21 days x 7 cycles      07/01/2016 Miscellaneous    Genetic Counseling by [name]-  Genetic testing was normal, and did not reveal a deleterious mutation in these genes.       07/08/2016 Imaging    CT CAP- 1. Small volume ascites, significantly decreased. 2. Stable diffuse omental soft tissue caking and diffuse peritoneal thickening along the bilateral paracolic gutters and bilateral pelvic peritoneal reflections, consistent with peritoneal carcinomatosis. 3. Stable asymmetrically enlarged right ovary, which may represent the primary site of ovarian malignancy. 4. No evidence of metastatic disease in the chest. No new sites of metastatic disease in the abdomen or pelvis.      07/09/2016 Miscellaneous    Gyn Onc re-evaluation- modest response to therapy, 3 more cycles of chemotherapy recommended.        09/11/2016 Imaging    CT C/A/P No significant change omental soft tissue caking, consistent with metastatic disease. Mild ascites is decreased since previous study.  Increased calcification along peritoneal surface in pelvic cul-de-sac, consistent with treated peritoneal metastatic disease.  Stable 4.5cm homogeneous right pelvic mass, which favors a uterine fibroid although right ovarian neoplasm cannot definitely be excluded.  No new or progressive metastatic disease identified. No evidence of metastatic disease within the thorax.       10/14/2016 Procedure    Robotic-assisted laparoscopic total hysterectomy with bilateral salpingoophorectomy, ex lap omentectomy, radical tumor debulking by Dr. Denman George      10/17/2016  Pathology Results    Diagnosis 1. 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 - CERVIX AND ENDOMETRIUM FREE OF TUMOR. - SEE  ONCOLOGY TABLE AND COMMENT. 2. Soft tissue, biopsy, umbilical nodule - HIGH GRADE SEROUS CARCINOMA. 3. Omentum, resection for tumor - HIGH GRADE SEROUS CARCINOMA, 33 CM.       Chart is reviewed and Dr. Serita Grit Op Note, surgical pathology, and follow-up note are reviewed in detail:   Ellen Hunt  is a 66 y.o.  year old with stage IIIC ovarian/primary peritoneal/fallopian tube cancer with slow but progressive response to neoadjuvant chemotherapy with carboplatin and paclitaxel. S/p 7 cycles adjuvant chemotherapy with carb/taxol. S/p interval optimal cytoreduction with hysterectomy, BSO, omentectomy on 10/14/16 with R1 residual disease. Healing well postoperatively with no issues. She is cleared to resume chemotherapy. I am recommending at least 3 additional cycles of adjuvant chemotherapy with carboplatin and paclitaxel. I would recommend repeat imaging and CA 125 at that time. If she is disease free, therapy can stop at that time, however, if disease persists, she will require ongoing therapy. I discussed with Ellen Hunt that due to her modest and slow response to primary therapy, prognosis is poorer, and she has a high probability for developing platinum resistance.  Ellen Hunt presents today accompanied by her husband for cycle 10 Carboplatin/Paclitaxel q21d. She was hospitalized from 1/17-1/20/18 when she initially presented with a cough and was found to have influenza A and pneumonia. She took tamiflu as well as antibiotics with complete resolution of her symptoms at this time. She complains of a soreness in her bilateral toes. She also has swelling in her lower extremities. She has had chronic R>L lower extremity swelling, however she's noting some right calf pain in the past few days. Otherwise she has been doing well and denies any SOB, Cp, abdominal pain.   Review of Systems  Constitutional: Negative.   HENT: Negative.   Eyes: Negative.   Respiratory: Negative.     Cardiovascular: Negative.   Gastrointestinal: Negative.   Genitourinary: Negative.   Musculoskeletal: Negative.   Skin: Negative.   Neurological: Negative.   Endo/Heme/Allergies: Negative.   Psychiatric/Behavioral: Negative.   All other systems reviewed and are negative.   Past Medical History:  Diagnosis Date  . Diabetes mellitus without complication (HCC)    on insulin pump  . Dysrhythmia   . 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 Family history of breast cancer   . GERD (gastroesophageal reflux disease)   . History of blood transfusion   . History of bronchitis   . History of chemotherapy   . History of urinary tract infection   . Hyperlipidemia   . Hypertension   . Low serum vitamin D   . Ovarian cancer ([place]) 05/09/2016  . Shingles     Past Surgical History:  Procedure Laterality Date  . CESAREAN SECTION    . DEBULKING N/A 10/14/2016   Procedure: DEBULKING;  Surgeon: Everitt Amber, MD;  Location: [facility];  Service: Gynecology;  Laterality: N/A;  . LAPAROTOMY WITH STAGING N/A 10/14/2016   Procedure: LAPAROTOMY WITH OMENTECTOMY AND TUMOR DEBULGING;  Surgeon: Everitt Amber, MD;  Location: [facility];  Service: Gynecology;  Laterality: N/A;  . LUMBAR FUSION  08/21/15   L3-L4 Dr. Timmothy Euler  . OMENTECTOMY N/A 10/14/2016   Procedure: OMENTECTOMY;  Surgeon: Everitt Amber, MD;  Location: [facility];  Service: Gynecology;  Laterality: N/A;  . ROBOTIC ASSISTED  TOTAL HYSTERECTOMY WITH BILATERAL SALPINGO OOPHERECTOMY Bilateral 10/14/2016   Procedure: XI ROBOTIC ASSISTED TOTAL LAPARSCOPIC  HYSTERECTOMY WITH BILATERAL SALPINGO OOPHORECTOMY;  Surgeon: Everitt Amber, MD;  Location: [facility];  Service: Gynecology;  Laterality: Bilateral;    Family History  Problem Relation Age of Onset  . Lung cancer Mother     smoker; dx in her 51s  . Leukemia Father   . Diabetes Paternal Grandmother   . Heart attack Paternal Grandmother   . Diabetes Paternal Grandfather   . Breast cancer  Paternal Aunt     dx in her 45s-30s  . Leukemia Paternal Uncle   . Heart attack Maternal Grandfather   . Breast cancer Cousin     maternal first cousin    Social History   Social History  . Marital status: Married    Spouse name: Ellen Hunt  . Number of children: 2  . Years of education: N/A   Occupational History  . retired    Social History Main Topics  . Smoking status: Never Smoker  . Smokeless tobacco: Never Used  . Alcohol use No  . Drug use: No  . Sexual activity: Not on file     Comment: married   Other Topics Concern  . Not on file   Social History Narrative  . No narrative on file     PHYSICAL EXAMINATION  ECOG PERFORMANCE STATUS: 1 - Symptomatic but completely ambulatory   Physical Exam  Constitutional: She is oriented to person, place, and time and well-developed, well-nourished, and in no distress.  HENT:  Head: Normocephalic and atraumatic.  Eyes: Conjunctivae and EOM are normal. Pupils are equal, round, and reactive to light.  Neck: Normal range of motion. Neck supple.  Cardiovascular: Normal rate, regular rhythm and normal heart sounds.   Pulmonary/Chest: Effort normal and breath sounds normal.  Abdominal: Soft. Bowel sounds are normal.  Musculoskeletal: Normal range of motion.  Neurological: She is alert and oriented to person, place, and time. Gait normal.  Skin: Skin is warm and dry.  Nursing note and vitals reviewed.   LABORATORY DATA: CBC    Component Value Date/Time   WBC 4.9 11/27/2016 0929   RBC 2.49 (L) 11/27/2016 0929   HGB 8.8 (L) 11/27/2016 0929   HCT 25.3 (L) 11/27/2016 0929   HCT 27.6 (L) 11/18/2016 1642   PLT 135 (L) 11/27/2016 0929   PLT 40 (LL) 11/18/2016 1642   MCV 101.6 (H) 11/27/2016 0929   MCV 99 (H) 11/18/2016 1642   MCH 35.3 (H) 11/27/2016 0929   MCHC 34.8 11/27/2016 0929   RDW 18.0 (H) 11/27/2016 0929   RDW 19.5 (H) 11/18/2016 1642   LYMPHSABS 0.4 (L) 11/27/2016 0929   LYMPHSABS 0.4 (L) 11/18/2016 1642   MONOABS  0.3 11/27/2016 0929   EOSABS 0.0 11/27/2016 0929   EOSABS 0.1 11/18/2016 1642   BASOSABS 0.0 11/27/2016 0929   BASOSABS 0.0 11/18/2016 1642      Chemistry      Component Value Date/Time   NA 136 11/27/2016 0929   NA 139 02/06/2016 1148   K 4.2 11/27/2016 0929   CL 103 11/27/2016 0929   CO2 20 (L) 11/27/2016 0929   BUN 19 11/27/2016 0929   BUN 21 02/06/2016 1148   CREATININE 0.82 11/27/2016 0929      Component Value Date/Time   CALCIUM 9.7 11/27/2016 0929   ALKPHOS 65 11/27/2016 0929   AST 20 11/27/2016 0929   ALT 13 (L) 11/27/2016 0929   BILITOT 0.5 11/27/2016  [id number]   BILITOT 0.3 07/02/2015 1005      Lab Results  Component Value Date   CA125 1,445.0 (H) 09/18/2016     PENDING LABS:   RADIOGRAPHIC STUDIES:  Dg Chest 2 View  Result Date: 11/19/2016 CLINICAL DATA:  Cough and fever. Active chemotherapy for ovarian cancer. EXAM: CHEST  2 VIEW COMPARISON:  CT 09/11/2016.  Chest radiograph 07/30/2015 FINDINGS: Tip of the right chest port in the mid SVC. Minimal right infrahilar opacity may represent focal pneumonia. No additional airspace consolidation. The heart size and mediastinal contours are unchanged from prior exam. No pleural effusion or pneumothorax. No acute osseous abnormalities are seen. IMPRESSION: Mild patchy right infrahilar opacity may reflect small focal pneumonia. Electronically Signed   By: Jeb Levering M.D.   On: 11/19/2016 22:56     PATHOLOGY:    ASSESSMENT AND PLAN:  Stage IIIC primary peritoneal carcinomatosis with slow but progressive response to neoadjuvant chemotherapy consisting of Carboplatin/Paclitaxel x 7 cycles (05/15/2016- 09/18/2016) followed by optimal cytoreductive surgery by Dr. Denman George on 10/14/2016 with R1 residual disease.  I have reviewed the patient's chart paying particular attention to Dr. Serita Grit Op Note, Pathology Report, and Dr. Serita Grit post-surgical follow-up note.  Her recommendations are noted: I am recommending at least  3 additional cycles of adjuvant chemotherapy with carboplatin and paclitaxel. I would recommend repeat imaging and CA 125 at that time. If she is disease free, therapy can stop at that time, however, if disease persists, she will require ongoing therapy. I discussed with Lilliauna that due to her modest and slow response to primary therapy, prognosis is poorer, and she has a high probability for developing platinum resistance.  THERAPY PLAN:  Will hold her Carboplatin/Paclitaxel (Cycle #10) for 1 week due to progressive anemia with hemoglobin 7.5 g/dL today. Transfuse 2 units pRBC transfusion tomorrow.  I have ordered restaging CT C/A/P to be performed in 4 weeks.  If disease-free, then discontinuation of therapy is recommended, however, if disease persists, ongoing treatment will be recommended.  CBC, CMP, CA 125 on her next visit.  I have ordered a stat doppler venous US of bilateral lower extremities to rule out DVT since she was having calf pain and swelling, however [place] was negative. Refilled her valium and norco today. RTC in 4 weeks to review restaging imaging and labs and to discuss the next plan of care.  Total of 60 min spent on patient care with more than 50% of time spent with the patient discussing her cancer and plan of care.   All questions were answered. The patient knows to call the clinic with any problems, questions or concerns. We can certainly see the patient much sooner if necessary.  This document serves as a record of services personally performed by Twana First, MD. It was created on her behalf by Shirlean Mylar, a trained medical scribe. The creation of this record is based on the scribe's personal observations and the provider's statements to them. This document has been checked and approved by the attending provider.  I have reviewed the above documentation for accuracy and completeness and I agree with the above.  This note is electronically signed by: Mikey College 12/18/2016 8:27 AM

## 2016-12-19 ENCOUNTER — Encounter (HOSPITAL_BASED_OUTPATIENT_CLINIC_OR_DEPARTMENT_OTHER): Payer: BC Managed Care – PPO

## 2016-12-19 ENCOUNTER — Other Ambulatory Visit: Payer: Self-pay | Admitting: Family Medicine

## 2016-12-19 DIAGNOSIS — R935 Abnormal findings on diagnostic imaging of other abdominal regions, including retroperitoneum: Secondary | ICD-10-CM | POA: Diagnosis not present

## 2016-12-19 DIAGNOSIS — T451X5A Adverse effect of antineoplastic and immunosuppressive drugs, initial encounter: Principal | ICD-10-CM

## 2016-12-19 DIAGNOSIS — D6481 Anemia due to antineoplastic chemotherapy: Secondary | ICD-10-CM

## 2016-12-19 DIAGNOSIS — C569 Malignant neoplasm of unspecified ovary: Secondary | ICD-10-CM

## 2016-12-19 DIAGNOSIS — C481 Malignant neoplasm of specified parts of peritoneum: Secondary | ICD-10-CM | POA: Diagnosis not present

## 2016-12-19 MED ORDER — SODIUM CHLORIDE 0.9% FLUSH
10.0000 mL | INTRAVENOUS | Status: AC | PRN
  Administered 2016-12-19: 10 mL

## 2016-12-19 MED ORDER — HEPARIN SOD (PORK) LOCK FLUSH 100 UNIT/ML IV SOLN
500.0000 [IU] | Freq: Every day | INTRAVENOUS | Status: AC | PRN
  Administered 2016-12-19: 500 [IU]

## 2016-12-19 MED ORDER — DIPHENHYDRAMINE HCL 25 MG PO CAPS
25.0000 mg | ORAL_CAPSULE | Freq: Once | ORAL | Status: AC
  Administered 2016-12-19: 25 mg via ORAL

## 2016-12-19 MED ORDER — ACETAMINOPHEN 325 MG PO TABS
650.0000 mg | ORAL_TABLET | Freq: Once | ORAL | Status: AC
  Administered 2016-12-19: 650 mg via ORAL

## 2016-12-19 MED ORDER — SODIUM CHLORIDE 0.9 % IV SOLN
250.0000 mL | Freq: Once | INTRAVENOUS | Status: AC
  Administered 2016-12-19: 250 mL via INTRAVENOUS

## 2016-12-19 MED ORDER — ACETAMINOPHEN 325 MG PO TABS
ORAL_TABLET | ORAL | Status: AC
  Filled 2016-12-19: qty 2

## 2016-12-19 MED ORDER — DIPHENHYDRAMINE HCL 25 MG PO CAPS
ORAL_CAPSULE | ORAL | Status: AC
  Filled 2016-12-19: qty 1

## 2016-12-19 NOTE — Progress Notes (Signed)
Ellen Hunt tolerated blood transfusion well without complaints or incident. VSS upon discharge. Pt discharged self ambulatory in satisfactory condition accompanied by her husband

## 2016-12-19 NOTE — Patient Instructions (Signed)
[facility] at [facility] Discharge Instructions  RECOMMENDATIONS MADE BY THE CONSULTANT AND ANY TEST RESULTS WILL BE SENT TO YOUR REFERRING PHYSICIAN.  Received 2 units of blood today. Follow-up as scheduled. Call clinic for any questions or concerns  Thank you for choosing [facility] at [facility] to provide your oncology and hematology care.  To afford each patient quality time with our provider, please arrive at least 15 minutes before your scheduled appointment time.    If you have a lab appointment with the Cancer Center please come in thru the  Main Entrance and check in at the main information desk  You need to re-schedule your appointment should you arrive 10 or more minutes late.  We strive to give you quality time with our providers, and arriving late affects you and other patients whose appointments are after yours.  Also, if you no show three or more times for appointments you may be dismissed from the clinic at the providers discretion.     Again, thank you for choosing [facility].  Our hope is that these requests will decrease the amount of time that you wait before being seen by our physicians.       _____________________________________________________________  Should you have questions after your visit to [facility], please contact our office at [phone] between the hours of 8:30 a.m. and 4:30 p.m.  Voicemails left after 4:30 p.m. will not be returned until the following business day.  For prescription refill requests, have your pharmacy contact our office.       Resources For Cancer Patients and their Caregivers ? American Cancer Society: Can assist with transportation, wigs, general needs, runs Look Good Feel Better.        1-888-227-6333 ? Cancer Care: Provides financial assistance, online support groups, medication/co-pay assistance.  1-800-813-HOPE (4673) ? [facility] Assists [place] cancer patients and their families through emotional , educational and financial support.  336-427-4357 ? [place] DSS Where to apply for food stamps, Medicaid and utility assistance. 336-342-1394 ? [facility]: Transportation to medical appointments. 336-347-2287 ? Social Security Administration: May apply for disability if have a Stage IV cancer. 336-342-7796 1-800-772-1213 ? [place] Aging, Disability and Transit Services: Assists with nutrition, care and transit needs. 336-349-2343  Cancer Center Support Programs: @10RELATIVEDAYS@ > Cancer Support Group  2nd Tuesday of the month 1pm-2pm, Journey Room  > Creative Journey  3rd Tuesday of the month 1130am-1pm, Journey Room  > Look Good Feel Better  1st Wednesday of the month 10am-12 noon, Journey Room (Call American Cancer Society to register 1-800-395-5775)   

## 2016-12-20 LAB — TYPE AND SCREEN
Blood Product Expiration Date: 201803132359
Blood Product Expiration Date: 201803162359
ISSUE DATE / TIME: 201802161040
ISSUE DATE / TIME: 201802160828
Unit Type and Rh: 5100
Unit Type and Rh: 5100

## 2016-12-25 ENCOUNTER — Other Ambulatory Visit (HOSPITAL_COMMUNITY): Payer: Self-pay | Admitting: Oncology

## 2016-12-25 ENCOUNTER — Encounter (HOSPITAL_BASED_OUTPATIENT_CLINIC_OR_DEPARTMENT_OTHER): Payer: BC Managed Care – PPO

## 2016-12-25 ENCOUNTER — Other Ambulatory Visit: Payer: Self-pay | Admitting: Family Medicine

## 2016-12-25 VITALS — BP 136/68 | HR 85 | Temp 97.8°F | Resp 18 | Wt 204.4 lb

## 2016-12-25 DIAGNOSIS — Z5189 Encounter for other specified aftercare: Secondary | ICD-10-CM

## 2016-12-25 DIAGNOSIS — Z5111 Encounter for antineoplastic chemotherapy: Secondary | ICD-10-CM

## 2016-12-25 DIAGNOSIS — C481 Malignant neoplasm of specified parts of peritoneum: Secondary | ICD-10-CM

## 2016-12-25 DIAGNOSIS — R935 Abnormal findings on diagnostic imaging of other abdominal regions, including retroperitoneum: Secondary | ICD-10-CM | POA: Diagnosis not present

## 2016-12-25 LAB — CBC WITH DIFFERENTIAL/PLATELET
BASOS ABS: 0 10*3/uL (ref 0.0–0.1)
Basophils Relative: 0 %
EOS PCT: 0 %
Eosinophils Absolute: 0 10*3/uL (ref 0.0–0.7)
HCT: 33.7 % — ABNORMAL LOW (ref 36.0–46.0)
Hemoglobin: 11.1 g/dL — ABNORMAL LOW (ref 12.0–15.0)
LYMPHS ABS: 0.3 10*3/uL — AB (ref 0.7–4.0)
Lymphocytes Relative: 3 %
MCH: 31.8 pg (ref 26.0–34.0)
MCHC: 32.9 g/dL (ref 30.0–36.0)
MCV: 96.6 fL (ref 78.0–100.0)
MONO ABS: 0.2 10*3/uL (ref 0.1–1.0)
MONOS PCT: 2 %
NEUTROS PCT: 95 %
Neutro Abs: 10.1 10*3/uL — ABNORMAL HIGH (ref 1.7–7.7)
PLATELETS: 149 10*3/uL — AB (ref 150–400)
RBC: 3.49 MIL/uL — AB (ref 3.87–5.11)
RDW: 23.5 % — AB (ref 11.5–15.5)
WBC: 10.6 10*3/uL — AB (ref 4.0–10.5)

## 2016-12-25 LAB — COMPREHENSIVE METABOLIC PANEL
ALBUMIN: 3.8 g/dL (ref 3.5–5.0)
ALT: 21 U/L (ref 14–54)
AST: 26 U/L (ref 15–41)
Alkaline Phosphatase: 88 U/L (ref 38–126)
Anion gap: 14 (ref 5–15)
BUN: 31 mg/dL — AB (ref 6–20)
CHLORIDE: 101 mmol/L (ref 101–111)
CO2: 21 mmol/L — AB (ref 22–32)
CREATININE: 0.99 mg/dL (ref 0.44–1.00)
Calcium: 9.6 mg/dL (ref 8.9–10.3)
GFR calc Af Amer: >60 mL/min (ref >60)
GFR, EST NON AFRICAN AMERICAN: 59 mL/min — AB (ref >60)
GLUCOSE: 334 mg/dL — AB (ref 65–99)
POTASSIUM: 4.5 mmol/L (ref 3.5–5.1)
Sodium: 136 mmol/L (ref 135–145)
Total Bilirubin: 0.6 mg/dL (ref 0.3–1.2)
Total Protein: 6.7 g/dL (ref 6.5–8.1)

## 2016-12-25 MED ORDER — FAMOTIDINE IN NACL 20-0.9 MG/50ML-% IV SOLN
20.0000 mg | Freq: Once | INTRAVENOUS | Status: AC
  Administered 2016-12-25: 20 mg via INTRAVENOUS
  Filled 2016-12-25: qty 50

## 2016-12-25 MED ORDER — PACLITAXEL CHEMO INJECTION 300 MG/50ML
175.0000 mg/m2 | Freq: Once | INTRAVENOUS | Status: AC
  Administered 2016-12-25: 366 mg via INTRAVENOUS
  Filled 2016-12-25: qty 61

## 2016-12-25 MED ORDER — SODIUM CHLORIDE 0.9 % IV SOLN
INTRAVENOUS | Status: AC
  Administered 2016-12-25: 11:00:00 via INTRAVENOUS

## 2016-12-25 MED ORDER — SODIUM CHLORIDE 0.9 % IV SOLN
680.0000 mg | Freq: Once | INTRAVENOUS | Status: AC
  Administered 2016-12-25: 680 mg via INTRAVENOUS
  Filled 2016-12-25: qty 68

## 2016-12-25 MED ORDER — DIPHENHYDRAMINE HCL 50 MG/ML IJ SOLN
50.0000 mg | Freq: Once | INTRAMUSCULAR | Status: AC
  Administered 2016-12-25: 50 mg via INTRAVENOUS
  Filled 2016-12-25: qty 1

## 2016-12-25 MED ORDER — PALONOSETRON HCL INJECTION 0.25 MG/5ML
0.2500 mg | Freq: Once | INTRAVENOUS | Status: AC
  Administered 2016-12-25: 0.25 mg via INTRAVENOUS
  Filled 2016-12-25: qty 5

## 2016-12-25 MED ORDER — PEGFILGRASTIM 6 MG/0.6ML ~~LOC~~ PSKT
6.0000 mg | PREFILLED_SYRINGE | Freq: Once | SUBCUTANEOUS | Status: AC
  Administered 2016-12-25: 6 mg via SUBCUTANEOUS
  Filled 2016-12-25: qty 0.6

## 2016-12-25 MED ORDER — HEPARIN SOD (PORK) LOCK FLUSH 100 UNIT/ML IV SOLN
500.0000 [IU] | Freq: Once | INTRAVENOUS | Status: AC | PRN
  Administered 2016-12-25: 500 [IU]
  Filled 2016-12-25: qty 5

## 2016-12-25 MED ORDER — SODIUM CHLORIDE 0.9 % IV SOLN
20.0000 mg | Freq: Once | INTRAVENOUS | Status: AC
  Administered 2016-12-25: 20 mg via INTRAVENOUS
  Filled 2016-12-25: qty 2

## 2016-12-25 NOTE — Progress Notes (Signed)
Tolerated chemo well.Ellen Hunt arrived today for ONPRO neulasta on body injector. See MAR for administration details. Injector in place and engaged with green light indicator on flashing. Tolerated application with out problems. Stable and ambulatory on discharge home with husband.

## 2016-12-25 NOTE — Patient Instructions (Signed)
[name] Discharge Instructions for Patients Receiving Chemotherapy   Beginning January 23rd 2017 lab work for the [facility] will be done in the  Main lab at [facility] on 1st floor. If you have a lab appointment with the [facility] please come in thru the  Main Entrance and check in at the main information desk   Today you received the following chemotherapy agents taxol and carbo. Neulasta device will dispense medication between 615 pm and 715 pm tomorrow. You may take device off after 730 pm tomorrow.  To help prevent nausea and vomiting after your treatment, we encourage you to take your nausea medication as instructed.   If you develop nausea and vomiting, or diarrhea that is not controlled by your medication, call the clinic.  The clinic phone number is [phone]. Office hours are Monday-Friday 8:30am-5:00pm.  BELOW ARE SYMPTOMS THAT SHOULD BE REPORTED IMMEDIATELY:  *FEVER GREATER THAN 101.0 F  *CHILLS WITH OR WITHOUT FEVER  NAUSEA AND VOMITING THAT IS NOT CONTROLLED WITH YOUR NAUSEA MEDICATION  *UNUSUAL SHORTNESS OF BREATH  *UNUSUAL BRUISING OR BLEEDING  TENDERNESS IN MOUTH AND THROAT WITH OR WITHOUT PRESENCE OF ULCERS  *URINARY PROBLEMS  *BOWEL PROBLEMS  UNUSUAL RASH Items with * indicate a potential emergency and should be followed up as soon as possible. If you have an emergency after office hours please contact your primary care physician or go to the nearest emergency department.  Please call the clinic during office hours if you have any questions or concerns.   You may also contact the Patient Navigator at [phone] should you have any questions or need assistance in obtaining follow up care.      Resources For Cancer Patients and their Caregivers ? American Cancer Society: Can assist with transportation, wigs, general needs, runs Look Good Feel Better.        (806)814-9446 ? Cancer Care: Provides financial  assistance, online support groups, medication/co-pay assistance.  1-800-813-HOPE (732)843-4075) ? [facility] Assists [place] cancer patients and their families through emotional , educational and financial support.  (269) 166-8903 ? [place] DSS Where to apply for food stamps, Medicaid and utility assistance. 8164003520 ? [facility]: Transportation to medical appointments. (709) 467-2782 ? Social Security Administration: May apply for disability if have a Stage IV cancer. (607) 440-0286 8788413285 ? [employer], Disability and Transit Services: Assists with nutrition, care and transit needs. 213-155-8322

## 2016-12-25 NOTE — Progress Notes (Signed)
Tolerated chemo well. Stable and ambulatory on discharge home with husband. 

## 2016-12-29 ENCOUNTER — Encounter (HOSPITAL_BASED_OUTPATIENT_CLINIC_OR_DEPARTMENT_OTHER): Payer: BC Managed Care – PPO

## 2016-12-29 ENCOUNTER — Encounter (HOSPITAL_COMMUNITY): Payer: Self-pay

## 2016-12-29 VITALS — BP 134/45 | HR 108 | Temp 97.4°F | Resp 18

## 2016-12-29 DIAGNOSIS — E538 Deficiency of other specified B group vitamins: Secondary | ICD-10-CM

## 2016-12-29 MED ORDER — CYANOCOBALAMIN 1000 MCG/ML IJ SOLN
INTRAMUSCULAR | Status: AC
  Filled 2016-12-29: qty 1

## 2016-12-29 MED ORDER — CYANOCOBALAMIN 1000 MCG/ML IJ SOLN
1000.0000 ug | Freq: Once | INTRAMUSCULAR | Status: AC
  Administered 2016-12-29: 1000 ug via INTRAMUSCULAR

## 2016-12-29 NOTE — Progress Notes (Signed)
Ellen Hunt presents today for injection per the provider's orders.  B12 administration without incident; see MAR for injection details.  Patient tolerated procedure well and without incident.  No questions or complaints noted at this time.

## 2016-12-29 NOTE — Patient Instructions (Signed)
[facility] at [facility] Discharge Instructions  RECOMMENDATIONS MADE BY THE CONSULTANT AND ANY TEST RESULTS WILL BE SENT TO YOUR REFERRING PHYSICIAN.  B12 injection today. Return as scheduled.   Thank you for choosing [facility] at [facility] to provide your oncology and hematology care.  To afford each patient quality time with our provider, please arrive at least 15 minutes before your scheduled appointment time.    If you have a lab appointment with the Cancer Center please come in thru the  Main Entrance and check in at the main information desk  You need to re-schedule your appointment should you arrive 10 or more minutes late.  We strive to give you quality time with our providers, and arriving late affects you and other patients whose appointments are after yours.  Also, if you no show three or more times for appointments you may be dismissed from the clinic at the providers discretion.     Again, thank you for choosing [facility].  Our hope is that these requests will decrease the amount of time that you wait before being seen by our physicians.       _____________________________________________________________  Should you have questions after your visit to [facility], please contact our office at [phone] between the hours of 8:30 a.m. and 4:30 p.m.  Voicemails left after 4:30 p.m. will not be returned until the following business day.  For prescription refill requests, have your pharmacy contact our office.       Resources For Cancer Patients and their Caregivers ? American Cancer Society: Can assist with transportation, wigs, general needs, runs Look Good Feel Better.        1-888-227-6333 ? Cancer Care: Provides financial assistance, online support groups, medication/co-pay assistance.  1-800-813-HOPE (4673) ? [facility] Assists [place] cancer patients and their  families through emotional , educational and financial support.  336-427-4357 ? [place] DSS Where to apply for food stamps, Medicaid and utility assistance. 336-342-1394 ? [facility]: Transportation to medical appointments. 336-347-2287 ? Social Security Administration: May apply for disability if have a Stage IV cancer. 336-342-7796 1-800-772-1213 ? [place] Aging, Disability and Transit Services: Assists with nutrition, care and transit needs. 336-349-2343  Cancer Center Support Programs: @10RELATIVEDAYS@ > Cancer Support Group  2nd Tuesday of the month 1pm-2pm, Journey Room  > Creative Journey  3rd Tuesday of the month 1130am-1pm, Journey Room  > Look Good Feel Better  1st Wednesday of the month 10am-12 noon, Journey Room (Call American Cancer Society to register 1-800-395-5775)   

## 2016-12-30 ENCOUNTER — Other Ambulatory Visit (HOSPITAL_COMMUNITY): Payer: Self-pay

## 2016-12-30 ENCOUNTER — Telehealth (HOSPITAL_COMMUNITY): Payer: Self-pay

## 2016-12-30 DIAGNOSIS — R309 Painful micturition, unspecified: Secondary | ICD-10-CM

## 2016-12-30 DIAGNOSIS — R935 Abnormal findings on diagnostic imaging of other abdominal regions, including retroperitoneum: Secondary | ICD-10-CM | POA: Diagnosis not present

## 2016-12-30 LAB — URINALYSIS, ROUTINE W REFLEX MICROSCOPIC
Bilirubin Urine: NEGATIVE
GLUCOSE, UA: 50 mg/dL — AB
Hgb urine dipstick: NEGATIVE
KETONES UR: NEGATIVE mg/dL
LEUKOCYTES UA: NEGATIVE
Nitrite: NEGATIVE
PH: 5 (ref 5.0–8.0)
Protein, ur: NEGATIVE mg/dL
SPECIFIC GRAVITY, URINE: 1.016 (ref 1.005–1.030)

## 2016-12-30 NOTE — Telephone Encounter (Signed)
Patient called stating that she had been constipated for 2 days and had used miralax and senekot and had finally passed some stool. Now she states she is having terrible pain when she urinates. Reviewed with MD. Instructed patient to come in to give a urine sample that we can send for UA and UC. Patient verbalized understanding.

## 2016-12-30 NOTE — Telephone Encounter (Signed)
Patient came to cancer center and provided urine sample for UA and UC.

## 2017-01-01 LAB — URINE CULTURE: CULTURE: NO GROWTH

## 2017-01-08 ENCOUNTER — Ambulatory Visit (HOSPITAL_COMMUNITY): Payer: BC Managed Care – PPO

## 2017-01-12 ENCOUNTER — Ambulatory Visit (HOSPITAL_COMMUNITY)
Admission: RE | Admit: 2017-01-12 | Discharge: 2017-01-12 | Disposition: A | Discharge status: Home / Self Care | Payer: BC Managed Care – PPO | Source: Ambulatory Visit | Attending: Oncology | Admitting: Oncology

## 2017-01-12 ENCOUNTER — Other Ambulatory Visit: Payer: Self-pay | Admitting: Family Medicine

## 2017-01-12 ENCOUNTER — Ambulatory Visit (HOSPITAL_COMMUNITY): Payer: BC Managed Care – PPO

## 2017-01-12 DIAGNOSIS — N200 Calculus of kidney: Secondary | ICD-10-CM | POA: Insufficient documentation

## 2017-01-12 DIAGNOSIS — Z90710 Acquired absence of both cervix and uterus: Secondary | ICD-10-CM | POA: Diagnosis not present

## 2017-01-12 DIAGNOSIS — Z90722 Acquired absence of ovaries, bilateral: Secondary | ICD-10-CM | POA: Insufficient documentation

## 2017-01-12 DIAGNOSIS — Z9079 Acquired absence of other genital organ(s): Secondary | ICD-10-CM | POA: Insufficient documentation

## 2017-01-12 DIAGNOSIS — C481 Malignant neoplasm of specified parts of peritoneum: Secondary | ICD-10-CM | POA: Insufficient documentation

## 2017-01-12 DIAGNOSIS — I2510 Atherosclerotic heart disease of native coronary artery without angina pectoris: Secondary | ICD-10-CM | POA: Diagnosis not present

## 2017-01-12 MED ORDER — IOPAMIDOL (ISOVUE-370) INJECTION 76%: 100.0000 mL | Freq: Once | INTRAVENOUS | Status: DC | PRN

## 2017-01-12 MED ORDER — IOPAMIDOL (ISOVUE-300) INJECTION 61%
100.0000 mL | Freq: Once | INTRAVENOUS | Status: AC | PRN
  Administered 2017-01-12: 100 mL via INTRAVENOUS

## 2017-01-12 NOTE — Telephone Encounter (Signed)
Patient last seen for diabetes July 2017, please advise

## 2017-01-13 NOTE — Assessment & Plan Note (Addendum)
Stage IIIC primary peritoneal carcinomatosis with slow but progressive response to neoadjuvant chemotherapy consisting of Carboplatin/Paclitaxel x 7 cycles (05/15/2016- 09/18/2016) followed by optimal cytoreductive surgery by Dr. Denman George on 10/14/2016 with R1 resection.  She then underwent 3 additional cycles of Carboplatin/Paclitaxel from 11/06/2016- 12/25/2016, followed by CT imaging on 01/12/2017 that demonstrated NED.  Oncology history updated.  Labs today with port flush: CBC diff, CMET, CA 125.  I personally reviewed and went over laboratory results with the patient.  The results are noted within this dictation.  Port flush with labs in 6 weeks: CBC diff, CMET, CA 125.  I personally reviewed and went over radiographic studies with the patient.  The results are noted within this dictation.  CT imaging on 01/12/2017 demonstrated NED.    Future imaging will be performed when clinically indicated in accordance with NCCN guidelines.  Based upon Dr. Serita Grit recommendations post-operatively, we will cease systemic chemotherapy and transition into close surveillance program in accordance with NCCN guidelines.  I will refill her pain medication today.  She no longer needs prophylaxis Valtrex and therefore, this medication will be discontinued.  She is a candidate for Shingrix vaccine.  I will plan on administering to her in 6 weeks when she returns.  2-6 months after her first dose, she will be due for her 2nd and final dose.  In [facility], I cannot order this vaccine and therefore I discussed with Alvester Chou, pharmD, who will look into this.  Will continue with monthly B12 injections.  She will be due in 1-2 weeks for her next injection.  Return in 6 weeks for follow-up.

## 2017-01-13 NOTE — Progress Notes (Signed)
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 Denman Hunt on 10/14/2016 with R1 resection.  She then underwent 3 additional cycles of Carboplatin/Paclitaxel from 11/06/2016- 12/25/2016, followed by CT imaging on 01/12/2017 that demonstrated NED.    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 This may potentially be ovarian or a GI in etiology.      04/30/2016 Tumor Marker    CA 125- 7149.0 (H)      05/01/2016 Procedure    US paracentesis- Successful ultrasound-guided paracentesis yielding 1.8 liters of peritoneal fluid.      05/01/2016 Imaging    US pelvis- Both transabdominal and transvaginal sonography are significantly limited by large patient habitus and ascites. Neither uterus or ovaries were visualized on this exam.      05/02/2016 Pathology Results    PERITONEAL/ASCITIC  FLUID(SPECIMEN 1 OF 1 COLLECTED 05/01/16): MALIGNANT CELLS CONSISTENT WITH METASTATIC HIGH GRADE SEROUS CARCINOMA.      05/08/2016 Imaging    CT chest- No evidence of metastatic disease in the chest. Peritoneal/omental disease with abdominal ascites in the upper abdomen, incompletely visualized.       05/13/2016 Procedure    Placement of single lumen port a cath via right internal jugular vein. The catheter tip lies at the cavoatrial junction. A power injectable port a cath was placed and is ready for immediate use.      05/15/2016 Procedure    US Paracentesis- 3400 ml yellow colored ascites removed      05/15/2016 - 09/18/2016 Chemotherapy    Carboplatin/Paclitaxel every 21 days x 7 cycles      07/01/2016 Miscellaneous    Genetic Counseling by Ellen Hunt-  Genetic testing was normal, and did not reveal a deleterious mutation in these genes.       07/08/2016 Imaging    CT CAP- 1. Small volume ascites, significantly decreased. 2. Stable diffuse omental soft tissue caking and diffuse peritoneal thickening along the bilateral paracolic gutters and bilateral pelvic peritoneal reflections, consistent with peritoneal carcinomatosis. 3. Stable asymmetrically enlarged right ovary, which may represent the primary site of ovarian malignancy. 4. No evidence of metastatic disease in the chest. No new sites of metastatic disease in the abdomen or pelvis.      07/09/2016 Miscellaneous    Gyn Onc re-evaluation- modest response to therapy, 3 more cycles of chemotherapy recommended.        09/11/2016 Imaging    CT C/A/P No significant change omental soft tissue caking, consistent with metastatic disease. Mild ascites is decreased since previous study.  Increased calcification along peritoneal surface in  pelvic cul-de-sac, consistent with treated peritoneal metastatic disease.  Stable 4.5cm homogeneous right pelvic mass, which favors a uterine fibroid although right ovarian neoplasm cannot  definitely be excluded.  No new or progressive metastatic disease identified. No evidence of metastatic disease within the thorax.       10/14/2016 Procedure    Robotic-assisted laparoscopic total hysterectomy with bilateral salpingoophorectomy, ex lap omentectomy, radical tumor debulking by Dr. Denman Hunt      10/17/2016 Pathology Results    Diagnosis 1. 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 - CERVIX AND ENDOMETRIUM FREE OF TUMOR. - SEE ONCOLOGY TABLE AND COMMENT. 2. Soft tissue, biopsy, umbilical nodule - HIGH GRADE SEROUS CARCINOMA. 3. Omentum, resection for tumor - HIGH GRADE SEROUS CARCINOMA, 33 CM.      11/06/2016 - 12/25/2016 Chemotherapy    Carboplatin/Paclitaxel x 3 cycles       01/12/2017 Imaging    CT CAP- 1. Interval hysterectomy, bilateral salpingo-oophorectomy and omentectomy without evidence of tumor recurrence. 2. No evidence of metastatic disease. 3. 5 mm nonobstructing lower pole left renal calculus.      01/13/2017 Remission    No evidence of residual disease on CT imaging.      She is here today to review most recent imaging results and medical oncology recommendations moving forward.  She was doing well.  She denies any oncology complaints at this point time.  She does not need a refill on a few medications.  Review of Systems  Constitutional: Negative.  Negative for chills, fever and weight loss.  HENT: Negative.   Eyes: Negative.   Respiratory: Negative.  Negative for cough.   Cardiovascular: Negative.  Negative for chest pain.  Gastrointestinal: Negative.  Negative for blood in stool, constipation, diarrhea, melena, nausea and vomiting.  Genitourinary: Negative.   Musculoskeletal: Negative.   Skin: Negative.   Neurological: Negative.  Negative for weakness.  Endo/Heme/Allergies: Negative.   Psychiatric/Behavioral: Negative.     Past  Medical History:  Diagnosis Date  . Diabetes mellitus without complication (HCC)    on insulin pump  . Dysrhythmia   . 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 Family history of breast cancer   . GERD (gastroesophageal reflux disease)   . History of blood transfusion   . History of bronchitis   . History of chemotherapy   . History of urinary tract infection   . Hyperlipidemia   . Hypertension   . Low serum vitamin D   . Ovarian cancer ([place]) 05/09/2016  . Shingles     Past Surgical History:  Procedure Laterality Date  . CESAREAN SECTION    . DEBULKING N/A 10/14/2016   Procedure: DEBULKING;  Surgeon: Everitt Amber, MD;  Location: [facility];  Service: Gynecology;  Laterality: N/A;  . LAPAROTOMY WITH STAGING N/A 10/14/2016   Procedure: LAPAROTOMY WITH OMENTECTOMY AND TUMOR DEBULGING;  Surgeon: Everitt Amber, MD;  Location: [facility];  Service: Gynecology;  Laterality: N/A;  . LUMBAR FUSION  08/21/15   L3-L4 Dr. Timmothy Euler  . OMENTECTOMY N/A 10/14/2016   Procedure: OMENTECTOMY;  Surgeon: Everitt Amber, MD;  Location: [facility];  Service: Gynecology;  Laterality: N/A;  . ROBOTIC ASSISTED TOTAL HYSTERECTOMY WITH BILATERAL SALPINGO OOPHERECTOMY Bilateral 10/14/2016   Procedure: XI ROBOTIC ASSISTED TOTAL LAPARSCOPIC  HYSTERECTOMY WITH BILATERAL SALPINGO OOPHORECTOMY;  Surgeon: Everitt Amber, MD;  Location: [facility];  Service: Gynecology;  Laterality: Bilateral;    Family History  Problem Relation  Age of Onset  . Lung cancer Mother     smoker; dx in her 76s  . Leukemia Father   . Diabetes Paternal Grandmother   . Heart attack Paternal Grandmother   . Diabetes Paternal Grandfather   . Breast cancer Paternal Aunt     dx in her 62s-30s  . Leukemia Paternal Uncle   . Heart attack Maternal Grandfather   . Breast cancer Cousin     maternal first cousin    Social History   Social History  . Marital status: Married    Spouse name: Ellen Hunt  . Number of children: 2  . Years of education:  N/A   Occupational History  . retired    Social History Main Topics  . Smoking status: Never Smoker  . Smokeless tobacco: Never Used  . Alcohol use No  . Drug use: No  . Sexual activity: Not Asked     Comment: married   Other Topics Concern  . None   Social History Narrative  . None     PHYSICAL EXAMINATION  ECOG PERFORMANCE STATUS: 1 - Symptomatic but completely ambulatory  Vitals:   01/14/17 1224  BP: (!) 132/51  Pulse: 100  Resp: 18  Temp: 98.2 F (36.8 C)    Vitals - 1 value per visit 3/00/9233  SYSTOLIC 007  DIASTOLIC 51  Pulse 622  Temperature 98.2  Respirations 18  Weight (lb) 207    GENERAL:alert, no distress, well nourished, well developed, comfortable, cooperative, obese, smiling and accompanied by her husband SKIN: skin color, texture, turgor are normal, no rashes or significant lesions HEAD: Normocephalic, No masses, lesions, tenderness or abnormalities EYES: normal, EOMI, Conjunctiva are pink and non-injected EARS: External ears normal OROPHARYNX:lips, buccal mucosa, and tongue normal and mucous membranes are moist  NECK: supple, no adenopathy, trachea midline LYMPH:  no palpable lymphadenopathy BREAST:not examined LUNGS: clear to auscultation  HEART: regular rate & rhythm ABDOMEN:abdomen soft, obese and normal bowel sounds BACK: Back symmetric, no curvature. EXTREMITIES:less then 2 second capillary refill, no joint deformities, effusion, or inflammation, no skin discoloration, no cyanosis  NEURO: alert & oriented x 3 with fluent speech, no focal motor/sensory deficits, gait normal   LABORATORY DATA: CBC    Component Value Date/Time   WBC 4.2 01/14/2017 1115   RBC 2.85 (L) 01/14/2017 1115   HGB 9.3 (L) 01/14/2017 1115   HCT 27.8 (L) 01/14/2017 1115   HCT 27.6 (L) 11/18/2016 1642   PLT 55 (L) 01/14/2017 1115   PLT 40 (LL) 11/18/2016 1642   MCV 97.5 01/14/2017 1115   MCV 99 (H) 11/18/2016 1642   MCH 32.6 01/14/2017 1115   MCHC 33.5  01/14/2017 1115   RDW 23.6 (H) 01/14/2017 1115   RDW 19.5 (H) 11/18/2016 1642   LYMPHSABS 0.4 (L) 01/14/2017 1115   LYMPHSABS 0.4 (L) 11/18/2016 1642   MONOABS 0.6 01/14/2017 1115   EOSABS 0.0 01/14/2017 1115   EOSABS 0.1 11/18/2016 1642   BASOSABS 0.0 01/14/2017 1115   BASOSABS 0.0 11/18/2016 1642      Chemistry      Component Value Date/Time   NA 137 01/14/2017 1115   NA 139 02/06/2016 1148   K 4.2 01/14/2017 1115   CL 104 01/14/2017 1115   CO2 24 01/14/2017 1115   BUN 18 01/14/2017 1115   BUN 21 02/06/2016 1148   CREATININE 0.73 01/14/2017 1115      Component Value Date/Time   CALCIUM 9.3 01/14/2017 1115   ALKPHOS 86 01/14/2017 1115  AST 21 01/14/2017 1115   ALT 21 01/14/2017 1115   BILITOT 0.3 01/14/2017 1115   BILITOT 0.3 07/02/2015 1005        PENDING LABS:   RADIOGRAPHIC STUDIES:  Ct Chest W Contrast  Result Date: 01/12/2017 CLINICAL DATA:  Followup primary ovarian peritoneal carcinoma, restaging. Status post chemotherapy last week. History of omentectomy, hysterectomy and bilateral salpingo-oophorectomy on 10/14/2016. EXAM: CT CHEST, ABDOMEN, AND PELVIS WITH CONTRAST TECHNIQUE: Multidetector CT imaging of the chest, abdomen and pelvis was performed following the standard protocol during bolus administration of intravenous contrast. CONTRAST:  133mL ISOVUE-300 IOPAMIDOL (ISOVUE-300) INJECTION 61% COMPARISON:  09/10/2016. FINDINGS: CT CHEST FINDINGS Cardiovascular: Mild atheromatous arterial calcifications at the origins of the brachiocephalic vessels. Normal sized heart. Mediastinum/Nodes: No enlarged lymph nodes. Lungs/Pleura: No lung nodules or pleural fluid. Musculoskeletal: Thoracic spine degenerative changes. CT ABDOMEN PELVIS FINDINGS Hepatobiliary: No focal liver abnormality is seen. No gallstones, gallbladder wall thickening, or biliary dilatation. Pancreas: Unremarkable. No pancreatic ductal dilatation or surrounding inflammatory changes. Spleen: Small  calcified splenic granulomata. Adrenals/Urinary Tract: Normal appearing adrenal glands, right kidney, ureters and urinary bladder. 5 mm lower pole left renal calculus without hydronephrosis. Stomach/Bowel: Unremarkable stomach, small bowel and colon. Vascular/Lymphatic: Atheromatous arterial calcifications, including the abdominal aorta. No enlarged lymph nodes. Reproductive: Surgically absent uterus and ovaries. Other: The previously demonstrated omental tumor is no longer demonstrated. The previously demonstrated pelvic cul-de-sac peritoneal calcification is unchanged with no soft tissue mass, compatible with treated metastatic disease. No ascites. Musculoskeletal: Lumbar spine degenerative and postoperative changes. IMPRESSION: 1. Interval hysterectomy, bilateral salpingo-oophorectomy and omentectomy without evidence of tumor recurrence. 2. No evidence of metastatic disease. 3. 5 mm nonobstructing lower pole left renal calculus. Electronically Signed   By: Claudie Revering M.D.   On: 01/12/2017 10:53   Ct Abdomen Pelvis W Contrast  Result Date: 01/12/2017 CLINICAL DATA:  Followup primary ovarian peritoneal carcinoma, restaging. Status post chemotherapy last week. History of omentectomy, hysterectomy and bilateral salpingo-oophorectomy on 10/14/2016. EXAM: CT CHEST, ABDOMEN, AND PELVIS WITH CONTRAST TECHNIQUE: Multidetector CT imaging of the chest, abdomen and pelvis was performed following the standard protocol during bolus administration of intravenous contrast. CONTRAST:  183mL ISOVUE-300 IOPAMIDOL (ISOVUE-300) INJECTION 61% COMPARISON:  09/10/2016. FINDINGS: CT CHEST FINDINGS Cardiovascular: Mild atheromatous arterial calcifications at the origins of the brachiocephalic vessels. Normal sized heart. Mediastinum/Nodes: No enlarged lymph nodes. Lungs/Pleura: No lung nodules or pleural fluid. Musculoskeletal: Thoracic spine degenerative changes. CT ABDOMEN PELVIS FINDINGS Hepatobiliary: No focal liver abnormality  is seen. No gallstones, gallbladder wall thickening, or biliary dilatation. Pancreas: Unremarkable. No pancreatic ductal dilatation or surrounding inflammatory changes. Spleen: Small calcified splenic granulomata. Adrenals/Urinary Tract: Normal appearing adrenal glands, right kidney, ureters and urinary bladder. 5 mm lower pole left renal calculus without hydronephrosis. Stomach/Bowel: Unremarkable stomach, small bowel and colon. Vascular/Lymphatic: Atheromatous arterial calcifications, including the abdominal aorta. No enlarged lymph nodes. Reproductive: Surgically absent uterus and ovaries. Other: The previously demonstrated omental tumor is no longer demonstrated. The previously demonstrated pelvic cul-de-sac peritoneal calcification is unchanged with no soft tissue mass, compatible with treated metastatic disease. No ascites. Musculoskeletal: Lumbar spine degenerative and postoperative changes. IMPRESSION: 1. Interval hysterectomy, bilateral salpingo-oophorectomy and omentectomy without evidence of tumor recurrence. 2. No evidence of metastatic disease. 3. 5 mm nonobstructing lower pole left renal calculus. Electronically Signed   By: Claudie Revering M.D.   On: 01/12/2017 10:53   US Venous Img Lower Bilateral  Result Date: 12/18/2016 CLINICAL DATA:  Calf region pain and edema bilaterally EXAM: BILATERAL LOWER  EXTREMITY VENOUS DUPLEX ULTRASOUND TECHNIQUE: Gray-scale sonography with graded compression, as well as color Doppler and duplex ultrasound were performed to evaluate the lower extremity deep venous systems from the level of the common femoral vein and including the common femoral, femoral, profunda femoral, popliteal and calf veins including the posterior tibial, peroneal and gastrocnemius veins when visible. The superficial great saphenous vein was also interrogated. Spectral Doppler was utilized to evaluate flow at rest and with distal augmentation maneuvers in the common femoral, femoral and popliteal  veins. COMPARISON:  None. FINDINGS: RIGHT LOWER EXTREMITY Common Femoral Vein: No evidence of thrombus. Normal compressibility, respiratory phasicity and response to augmentation. Saphenofemoral Junction: No evidence of thrombus. Normal compressibility and flow on color Doppler imaging. Profunda Femoral Vein: No evidence of thrombus. Normal compressibility and flow on color Doppler imaging. Femoral Vein: No evidence of thrombus. Normal compressibility, respiratory phasicity and response to augmentation. Popliteal Vein: No evidence of thrombus. Normal compressibility, respiratory phasicity and response to augmentation. Calf Veins: No evidence of thrombus. Normal compressibility and flow on color Doppler imaging. Superficial Great Saphenous Vein: No evidence of thrombus. Normal compressibility and flow on color Doppler imaging. Venous Reflux:  None. Other Findings:  None. LEFT LOWER EXTREMITY Common Femoral Vein: No evidence of thrombus. Normal compressibility, respiratory phasicity and response to augmentation. Saphenofemoral Junction: No evidence of thrombus. Normal compressibility and flow on color Doppler imaging. Profunda Femoral Vein: No evidence of thrombus. Normal compressibility and flow on color Doppler imaging. Femoral Vein: No evidence of thrombus. Normal compressibility, respiratory phasicity and response to augmentation. Popliteal Vein: No evidence of thrombus. Normal compressibility, respiratory phasicity and response to augmentation. Calf Veins: No evidence of thrombus. Normal compressibility and flow on color Doppler imaging. Superficial Great Saphenous Vein: No evidence of thrombus. Normal compressibility and flow on color Doppler imaging. Venous Reflux:  None. Other Findings:  None. IMPRESSION: No evidence of deep venous thrombosis in either lower extremity. Electronically Signed   By: Lowella Grip III M.D.   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 Denman Hunt on 10/14/2016 with R1 resection.  She then underwent 3 additional cycles of Carboplatin/Paclitaxel from 11/06/2016- 12/25/2016, followed by CT imaging on 01/12/2017 that demonstrated NED.  Oncology history updated.  Labs today with port flush: CBC diff, CMET, CA 125.  I personally reviewed and went over laboratory results with the patient.  The results are noted within this dictation.  Port flush with labs in 6 weeks: CBC diff, CMET, CA 125.  I personally reviewed and went over radiographic studies with the patient.  The results are noted within this dictation.  CT imaging on 01/12/2017 demonstrated NED.    Future imaging will be performed when clinically indicated in accordance with NCCN guidelines.  Based upon Dr. Serita Grit recommendations post-operatively, we will cease systemic chemotherapy and transition into close surveillance program in accordance with NCCN guidelines.  I will refill her pain medication today.  She no longer needs prophylaxis Valtrex and therefore, this medication will be discontinued.  She is a candidate for Shingrix vaccine.  I will plan on administering to her in 6 weeks when she returns.  2-6 months after her first dose, she will be due for her 2nd and final dose.  In [facility], I cannot order this  vaccine and therefore I discussed with Ellen Hunt, pharmD, who will look into this.  Will continue with monthly B12 injections.  She will be due in 1-2 weeks for her next injection.  Return in 6 weeks for follow-up.   ORDERS PLACED FOR THIS ENCOUNTER: Orders Placed This Encounter  Procedures  . CBC with Differential  . Comprehensive metabolic panel  . CA 125  . CBC with Differential  . Comprehensive metabolic panel    . CA 125  . CBC with Differential  . Comprehensive metabolic panel  . CA 125  . Schedule Portacath Flush Appointment    MEDICATIONS PRESCRIBED THIS ENCOUNTER: Meds ordered this encounter  Medications  . HYDROcodone-acetaminophen (NORCO) 10-325 MG tablet    Sig: Take 1 tablet by mouth every 4 (four) hours as needed.    Dispense:  90 tablet    Refill:  0    Order Specific Question:   Supervising Provider    Answer:   Brunetta Genera [[id number]]  . heparin lock flush 100 unit/mL  . sodium chloride flush (NS) 0.9 % injection 10 mL    THERAPY PLAN:  NCCN guidelines for surveillance of Stage I-IV Epithelial Ovarian Cancer/ Fallopian Tube Cancer/ Primary Peritoneal Cancer in those experiencing a complete response recommends the following (1.2017):  A. Vists every 2-4 months for first 2 years, then 3-6 months for 3 years, and then annually after 5 years.  B. Physical exam including pelvic exam  C. CA-125 or other tumor markers if initially elevated.  D. Refer for genetic risk evaluation if not previously performed.   E. CBC and chemistry profile as indicated.   F. CT Chest/Abdomen/Pelvis, MRI, PET-CT, or PET (skull base to mid-thigh) as clinically indicated.  G. Chest X-ray as indicted.   H. Long-term wellness care.   All questions were answered. The patient knows to call the clinic with any problems, questions or concerns. We can certainly see the patient much sooner if necessary.  Patient and plan discussed with Dr. Twana First and she is in agreement with the aforementioned.   This note is electronically signed by: Doy Mince 01/14/2017 4:53 PM

## 2017-01-14 ENCOUNTER — Telehealth (HOSPITAL_COMMUNITY): Payer: Self-pay | Admitting: *Deleted

## 2017-01-14 ENCOUNTER — Encounter (HOSPITAL_COMMUNITY): Payer: BC Managed Care – PPO | Attending: Oncology | Admitting: Oncology

## 2017-01-14 ENCOUNTER — Telehealth: Payer: Self-pay | Admitting: Pharmacist

## 2017-01-14 ENCOUNTER — Encounter (HOSPITAL_COMMUNITY): Payer: Self-pay | Admitting: Oncology

## 2017-01-14 ENCOUNTER — Ambulatory Visit (HOSPITAL_COMMUNITY): Payer: BC Managed Care – PPO | Admitting: Oncology

## 2017-01-14 ENCOUNTER — Encounter (HOSPITAL_COMMUNITY): Payer: Self-pay | Admitting: *Deleted

## 2017-01-14 VITALS — BP 132/51 | HR 100 | Temp 98.2°F | Resp 18 | Ht 63.0 in | Wt 207.0 lb

## 2017-01-14 DIAGNOSIS — C481 Malignant neoplasm of specified parts of peritoneum: Secondary | ICD-10-CM

## 2017-01-14 DIAGNOSIS — R935 Abnormal findings on diagnostic imaging of other abdominal regions, including retroperitoneum: Secondary | ICD-10-CM | POA: Diagnosis present

## 2017-01-14 DIAGNOSIS — Z95828 Presence of other vascular implants and grafts: Secondary | ICD-10-CM

## 2017-01-14 DIAGNOSIS — C569 Malignant neoplasm of unspecified ovary: Secondary | ICD-10-CM

## 2017-01-14 LAB — COMPREHENSIVE METABOLIC PANEL
ALBUMIN: 3.8 g/dL (ref 3.5–5.0)
ALT: 21 U/L (ref 14–54)
AST: 21 U/L (ref 15–41)
Alkaline Phosphatase: 86 U/L (ref 38–126)
Anion gap: 9 (ref 5–15)
BILIRUBIN TOTAL: 0.3 mg/dL (ref 0.3–1.2)
BUN: 18 mg/dL (ref 6–20)
CO2: 24 mmol/L (ref 22–32)
Calcium: 9.3 mg/dL (ref 8.9–10.3)
Chloride: 104 mmol/L (ref 101–111)
Creatinine, Ser: 0.73 mg/dL (ref 0.44–1.00)
GFR calc Af Amer: >60 mL/min (ref >60)
GFR calc non Af Amer: >60 mL/min (ref >60)
Glucose, Bld: 142 mg/dL — ABNORMAL HIGH (ref 65–99)
POTASSIUM: 4.2 mmol/L (ref 3.5–5.1)
Sodium: 137 mmol/L (ref 135–145)
TOTAL PROTEIN: 6.6 g/dL (ref 6.5–8.1)

## 2017-01-14 LAB — CBC WITH DIFFERENTIAL/PLATELET
BASOS ABS: 0 10*3/uL (ref 0.0–0.1)
Basophils Relative: 0 %
Eosinophils Absolute: 0 10*3/uL (ref 0.0–0.7)
Eosinophils Relative: 1 %
HEMATOCRIT: 27.8 % — AB (ref 36.0–46.0)
HEMOGLOBIN: 9.3 g/dL — AB (ref 12.0–15.0)
LYMPHS PCT: 10 %
Lymphs Abs: 0.4 10*3/uL — ABNORMAL LOW (ref 0.7–4.0)
MCH: 32.6 pg (ref 26.0–34.0)
MCHC: 33.5 g/dL (ref 30.0–36.0)
MCV: 97.5 fL (ref 78.0–100.0)
MONOS PCT: 15 %
Monocytes Absolute: 0.6 10*3/uL (ref 0.1–1.0)
NEUTROS PCT: 74 %
Neutro Abs: 3.2 10*3/uL (ref 1.7–7.7)
Platelets: 55 10*3/uL — ABNORMAL LOW (ref 150–400)
RBC: 2.85 MIL/uL — AB (ref 3.87–5.11)
RDW: 23.6 % — ABNORMAL HIGH (ref 11.5–15.5)
WBC: 4.2 10*3/uL (ref 4.0–10.5)

## 2017-01-14 MED ORDER — HYDROCODONE-ACETAMINOPHEN 10-325 MG PO TABS: 1.0000 | ORAL_TABLET | ORAL | 0 refills | Status: DC | PRN

## 2017-01-14 MED ORDER — METFORMIN HCL 1000 MG PO TABS: ORAL_TABLET | ORAL | 0 refills | Status: DC

## 2017-01-14 MED ORDER — SODIUM CHLORIDE 0.9% FLUSH
10.0000 mL | INTRAVENOUS | Status: DC | PRN
  Administered 2017-01-14: 10 mL via INTRAVENOUS
  Filled 2017-01-14: qty 10

## 2017-01-14 MED ORDER — HEPARIN SOD (PORK) LOCK FLUSH 100 UNIT/ML IV SOLN
INTRAVENOUS | Status: AC
  Filled 2017-01-14: qty 5

## 2017-01-14 MED ORDER — HEPARIN SOD (PORK) LOCK FLUSH 100 UNIT/ML IV SOLN
500.0000 [IU] | Freq: Once | INTRAVENOUS | Status: AC
  Administered 2017-01-14: 500 [IU] via INTRAVENOUS

## 2017-01-14 NOTE — Telephone Encounter (Signed)
Patient called to request refill on metformin.  She was told by pharmacy that refill was denied because she needs to be seen.  Patient is currently receiving treatment for ovarian cancer and has not been to see PCP in over 6 months.  Rx for 90 days was sent into pharmacy and appt with new PCP made since Dr Sabra Heck is retiring.  Patient aware and appointment made to see Dr Evette Doffing

## 2017-01-14 NOTE — Progress Notes (Signed)
Riki Rusk presented for Portacath access and flush.   Portacath located right chest wall accessed with  H 20 needle.  Good blood return present. Blood drawn from port and was sent down to lab for resulting. Portacath flushed with 28ml NS and 500U/19ml Heparin and needle removed intact.  Procedure tolerated well and without incident.  Pt stable and discharged home ambulatory with family.

## 2017-01-14 NOTE — Patient Instructions (Signed)
[place] at [facility] Discharge Instructions  RECOMMENDATIONS MADE BY THE CONSULTANT AND ANY TEST RESULTS WILL BE SENT TO YOUR REFERRING PHYSICIAN.  You were seen today by [name] PA-C. Labs and port flush today. Port flush and labs in 6 weeks. Return in 6 weeks for follow up and Shingrix vaccine.    Thank you for choosing [place] at [facility] to provide your oncology and hematology care.  To afford each patient quality time with our provider, please arrive at least 15 minutes before your scheduled appointment time.    If you have a lab appointment with the [place] please come in thru the  Main Entrance and check in at the main information desk  You need to re-schedule your appointment should you arrive 10 or more minutes late.  We strive to give you quality time with our providers, and arriving late affects you and other patients whose appointments are after yours.  Also, if you no show three or more times for appointments you may be dismissed from the clinic at the providers discretion.     Again, thank you for choosing [facility].  Our hope is that these requests will decrease the amount of time that you wait before being seen by our physicians.       _____________________________________________________________  Should you have questions after your visit to [facility], please contact our office at [phone] between the hours of 8:30 a.m. and 4:30 p.m.  Voicemails left after 4:30 p.m. will not be returned until the following business day.  For prescription refill requests, have your pharmacy contact our office.       Resources For Cancer Patients and their Caregivers ? American Cancer Society: Can assist with transportation, wigs, general needs, runs Look Good Feel Better.        ([phone] ? Cancer Care: Provides financial assistance, online support groups, medication/co-pay  assistance.  1-800-813-HOPE 732-105-8068) ? [place] Assists [place] cancer patients and their families through emotional , educational and financial support.  709 531 3916 ? [place] DSS Where to apply for food stamps, Medicaid and utility assistance. 575-467-7729 ? [employer]: Transportation to medical appointments. (743)881-5263 ? Social Security Administration: May apply for disability if have a Stage IV cancer. (626)475-9126 (769) 510-6394 ? [employer], Disability and Transit Services: Assists with nutrition, care and transit needs. [place] Support Programs: @10RELATIVEDAYS @ > Cancer Support Group  2nd Tuesday of the month 1pm-2pm, Journey Room  > Creative Journey  3rd Tuesday of the month 1130am-1pm, Journey Room  > Look Good Feel Better  1st Wednesday of the month 10am-12 noon, Journey Room (Call [place] to register [phone])

## 2017-01-14 NOTE — Telephone Encounter (Signed)
Pt did not want to make a copay today 

## 2017-01-15 ENCOUNTER — Ambulatory Visit (HOSPITAL_COMMUNITY): Payer: BC Managed Care – PPO

## 2017-01-15 LAB — CA 125: CA 125: 116.1 U/mL — AB (ref 0.0–38.1)

## 2017-01-19 ENCOUNTER — Ambulatory Visit (HOSPITAL_COMMUNITY): Payer: BC Managed Care – PPO

## 2017-01-20 ENCOUNTER — Ambulatory Visit (HOSPITAL_COMMUNITY): Payer: BC Managed Care – PPO | Admitting: Oncology

## 2017-01-22 ENCOUNTER — Ambulatory Visit (HOSPITAL_COMMUNITY): Payer: BC Managed Care – PPO

## 2017-01-26 ENCOUNTER — Ambulatory Visit (HOSPITAL_COMMUNITY): Payer: BC Managed Care – PPO

## 2017-01-26 ENCOUNTER — Encounter (HOSPITAL_BASED_OUTPATIENT_CLINIC_OR_DEPARTMENT_OTHER): Payer: BC Managed Care – PPO

## 2017-01-26 ENCOUNTER — Ambulatory Visit (HOSPITAL_COMMUNITY): Payer: BC Managed Care – PPO | Admitting: Oncology

## 2017-01-26 VITALS — BP 144/61 | HR 110 | Temp 97.4°F | Resp 20

## 2017-01-26 DIAGNOSIS — E538 Deficiency of other specified B group vitamins: Secondary | ICD-10-CM

## 2017-01-26 MED ORDER — CYANOCOBALAMIN 1000 MCG/ML IJ SOLN
1000.0000 ug | Freq: Once | INTRAMUSCULAR | Status: AC
  Administered 2017-01-26: 1000 ug via INTRAMUSCULAR

## 2017-01-26 NOTE — Progress Notes (Signed)
Haden Cavenaugh presents today for injection per MD orders. B12 1000 mcg administered IM in left Upper Arm. Administration without incident. Patient tolerated well.

## 2017-01-26 NOTE — Patient Instructions (Signed)
[facility] at [facility] Discharge Instructions  RECOMMENDATIONS MADE BY THE CONSULTANT AND ANY TEST RESULTS WILL BE SENT TO YOUR REFERRING PHYSICIAN.  Vitamin B12 1000 mcg injection given as ordered. Return as scheduled.  Thank you for choosing [facility] at [facility] to provide your oncology and hematology care.  To afford each patient quality time with our provider, please arrive at least 15 minutes before your scheduled appointment time.    If you have a lab appointment with the Cancer Center please come in thru the  Main Entrance and check in at the main information desk  You need to re-schedule your appointment should you arrive 10 or more minutes late.  We strive to give you quality time with our providers, and arriving late affects you and other patients whose appointments are after yours.  Also, if you no show three or more times for appointments you may be dismissed from the clinic at the providers discretion.     Again, thank you for choosing [facility].  Our hope is that these requests will decrease the amount of time that you wait before being seen by our physicians.       _____________________________________________________________  Should you have questions after your visit to [facility], please contact our office at [phone] between the hours of 8:30 a.m. and 4:30 p.m.  Voicemails left after 4:30 p.m. will not be returned until the following business day.  For prescription refill requests, have your pharmacy contact our office.       Resources For Cancer Patients and their Caregivers ? American Cancer Society: Can assist with transportation, wigs, general needs, runs Look Good Feel Better.        1-888-227-6333 ? Cancer Care: Provides financial assistance, online support groups, medication/co-pay assistance.  1-800-813-HOPE (4673) ? [facility] Assists [place] cancer patients and their families through emotional , educational and financial support.  336-427-4357 ? [place] DSS Where to apply for food stamps, Medicaid and utility assistance. 336-342-1394 ? RCATS: Transportation to medical appointments. 336-347-2287 ? Social Security Administration: May apply for disability if have a Stage IV cancer. 336-342-7796 1-800-772-1213 ? [place] Aging, Disability and Transit Services: Assists with nutrition, care and transit needs. 336-349-2343  Cancer Center Support Programs: @10RELATIVEDAYS@ > Cancer Support Group  2nd Tuesday of the month 1pm-2pm, Journey Room  > Creative Journey  3rd Tuesday of the month 1130am-1pm, Journey Room  > Look Good Feel Better  1st Wednesday of the month 10am-12 noon, Journey Room (Call American [employer] to register [phone])   

## 2017-02-10 ENCOUNTER — Other Ambulatory Visit (HOSPITAL_COMMUNITY): Payer: Self-pay

## 2017-02-10 DIAGNOSIS — C569 Malignant neoplasm of unspecified ovary: Secondary | ICD-10-CM

## 2017-02-10 MED ORDER — DIAZEPAM 5 MG PO TABS: 2.5000 mg | ORAL_TABLET | ORAL | 1 refills | Status: DC | PRN

## 2017-02-10 NOTE — Telephone Encounter (Signed)
Received refill request from patients pharmacy for valium. Reviewed with provider, chart checked and refilled.

## 2017-02-13 ENCOUNTER — Encounter (HOSPITAL_COMMUNITY): Payer: Self-pay

## 2017-02-16 ENCOUNTER — Ambulatory Visit (HOSPITAL_COMMUNITY): Payer: BC Managed Care – PPO

## 2017-02-20 ENCOUNTER — Telehealth: Payer: Self-pay | Admitting: Pharmacist

## 2017-02-20 NOTE — Telephone Encounter (Signed)
Mrs. Hancock calls to report that she has completed treatment for ovarian cancer.  She reports that her BG has started to increase and thinks her insulin pump settings need to be adjusted.   BG in am / fasting - low 200's During day - usually mid 100's 150 to 170  Current Insulin Pump Settings:        Basal:  MN to 2am = 1.85                    2am to 8am = 2.20                    8am to 5pm = 2.25                    5pm to MN = 1.90       Insulin to CHO ratio = 8       Insulin Sensitivity = 13  Pump Setting changed:   Basal:   MN to 2am = 1.95    2am to 8am = 2.25               8am to 5pm = 2.25    5pm to MN = 1.90  No change in CHO ratio or Insulin Sensitivty  Patient has follow up with PCP in 3 weeks .

## 2017-02-25 ENCOUNTER — Encounter (HOSPITAL_COMMUNITY): Payer: BC Managed Care – PPO | Attending: Oncology | Admitting: Oncology

## 2017-02-25 ENCOUNTER — Encounter (HOSPITAL_COMMUNITY): Payer: Self-pay

## 2017-02-25 ENCOUNTER — Encounter (HOSPITAL_BASED_OUTPATIENT_CLINIC_OR_DEPARTMENT_OTHER): Payer: BC Managed Care – PPO

## 2017-02-25 VITALS — BP 131/64 | HR 95 | Temp 98.5°F | Resp 20 | Wt 212.1 lb

## 2017-02-25 DIAGNOSIS — R935 Abnormal findings on diagnostic imaging of other abdominal regions, including retroperitoneum: Secondary | ICD-10-CM | POA: Diagnosis present

## 2017-02-25 DIAGNOSIS — E538 Deficiency of other specified B group vitamins: Secondary | ICD-10-CM | POA: Diagnosis not present

## 2017-02-25 DIAGNOSIS — R5383 Other fatigue: Secondary | ICD-10-CM | POA: Diagnosis not present

## 2017-02-25 DIAGNOSIS — C481 Malignant neoplasm of specified parts of peritoneum: Secondary | ICD-10-CM | POA: Diagnosis not present

## 2017-02-25 DIAGNOSIS — C569 Malignant neoplasm of unspecified ovary: Secondary | ICD-10-CM

## 2017-02-25 DIAGNOSIS — Z95828 Presence of other vascular implants and grafts: Secondary | ICD-10-CM

## 2017-02-25 LAB — CBC WITH DIFFERENTIAL/PLATELET
Basophils Absolute: 0 10*3/uL (ref 0.0–0.1)
Basophils Relative: 0 %
EOS ABS: 0.5 10*3/uL (ref 0.0–0.7)
Eosinophils Relative: 10 %
HCT: 30.9 % — ABNORMAL LOW (ref 36.0–46.0)
HEMOGLOBIN: 10.4 g/dL — AB (ref 12.0–15.0)
LYMPHS ABS: 0.5 10*3/uL — AB (ref 0.7–4.0)
LYMPHS PCT: 11 %
MCH: 33.7 pg (ref 26.0–34.0)
MCHC: 33.7 g/dL (ref 30.0–36.0)
MCV: 100 fL (ref 78.0–100.0)
Monocytes Absolute: 0.5 10*3/uL (ref 0.1–1.0)
Monocytes Relative: 11 %
NEUTROS PCT: 68 %
Neutro Abs: 3.2 10*3/uL (ref 1.7–7.7)
Platelets: 165 10*3/uL (ref 150–400)
RBC: 3.09 MIL/uL — AB (ref 3.87–5.11)
RDW: 17.7 % — ABNORMAL HIGH (ref 11.5–15.5)
WBC: 4.7 10*3/uL (ref 4.0–10.5)

## 2017-02-25 LAB — COMPREHENSIVE METABOLIC PANEL
ALK PHOS: 98 U/L (ref 38–126)
ALT: 22 U/L (ref 14–54)
AST: 23 U/L (ref 15–41)
Albumin: 4 g/dL (ref 3.5–5.0)
Anion gap: 13 (ref 5–15)
BUN: 36 mg/dL — AB (ref 6–20)
CALCIUM: 9.6 mg/dL (ref 8.9–10.3)
CO2: 23 mmol/L (ref 22–32)
CREATININE: 0.99 mg/dL (ref 0.44–1.00)
Chloride: 101 mmol/L (ref 101–111)
GFR calc non Af Amer: 59 mL/min — ABNORMAL LOW (ref >60)
GLUCOSE: 193 mg/dL — AB (ref 65–99)
Potassium: 4.4 mmol/L (ref 3.5–5.1)
SODIUM: 137 mmol/L (ref 135–145)
Total Bilirubin: 0.4 mg/dL (ref 0.3–1.2)
Total Protein: 6.9 g/dL (ref 6.5–8.1)

## 2017-02-25 LAB — VITAMIN B12: Vitamin B-12: >7500 pg/mL — ABNORMAL HIGH (ref 180–914)

## 2017-02-25 MED ORDER — FUROSEMIDE 20 MG PO TABS: 20.0000 mg | ORAL_TABLET | Freq: Every day | ORAL | 1 refills | Status: DC

## 2017-02-25 MED ORDER — SODIUM CHLORIDE 0.9% FLUSH
10.0000 mL | INTRAVENOUS | Status: DC | PRN
  Administered 2017-02-25: 10 mL via INTRAVENOUS
  Filled 2017-02-25: qty 10

## 2017-02-25 MED ORDER — CYANOCOBALAMIN 1000 MCG/ML IJ SOLN
1000.0000 ug | Freq: Once | INTRAMUSCULAR | Status: AC
  Administered 2017-02-25: 1000 ug via INTRAMUSCULAR

## 2017-02-25 MED ORDER — HEPARIN SOD (PORK) LOCK FLUSH 100 UNIT/ML IV SOLN
500.0000 [IU] | Freq: Once | INTRAVENOUS | Status: AC
  Administered 2017-02-25: 500 [IU] via INTRAVENOUS
  Filled 2017-02-25: qty 5

## 2017-02-25 MED ORDER — CYANOCOBALAMIN 1000 MCG/ML IJ SOLN
INTRAMUSCULAR | Status: AC
  Filled 2017-02-25: qty 1

## 2017-02-25 MED ORDER — HYDROCODONE-ACETAMINOPHEN 10-325 MG PO TABS: 1.0000 | ORAL_TABLET | ORAL | 0 refills | Status: DC | PRN

## 2017-02-25 NOTE — Progress Notes (Signed)
Selenne Coggin presents today for injection per the provider's orders.  B12 administration without incident; see MAR for injection details.  Patient tolerated procedure well and without incident.  No questions or complaints noted at this time.  Riki Rusk presented for Portacath access and flush.   Portacath located right chest wall accessed with  H 20 needle.  Good blood return present. Portacath flushed with 17ml NS and 500U/61ml Heparin and needle removed intact.  Procedure tolerated well and without incident.

## 2017-02-25 NOTE — Progress Notes (Signed)
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[place])   04/29/2016 Imaging    CT abd/pelvis- Extensive omental caking as well as moderate amount of ascites within the abdomen most compatible with peritoneal metastatic disease, of unknown primary. This may potentially be ovarian or a GI in etiology.      04/30/2016 Tumor Marker    CA 125- 7149.0 (H)      05/01/2016 Procedure    US paracentesis- Successful ultrasound-guided paracentesis yielding 1.8 liters of peritoneal fluid.      05/01/2016 Imaging    US pelvis- Both transabdominal and transvaginal sonography are significantly limited by large patient habitus and ascites. Neither uterus or ovaries were visualized on this exam.      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      05/08/2016 Imaging    CT chest- No evidence of metastatic disease in the chest. Peritoneal/omental disease with abdominal ascites in the upper abdomen, incompletely visualized.       05/13/2016 Procedure    Placement of single lumen port a cath via right internal jugular vein. The catheter tip lies at the cavoatrial junction. A power injectable port a cath was placed and is ready for immediate use.      05/15/2016 Procedure    US Paracentesis- 3400 ml yellow colored ascites removed      05/15/2016 - 09/18/2016 Chemotherapy    Carboplatin/Paclitaxel every 21 days x 7 cycles      07/01/2016 Miscellaneous    Genetic Counseling by [name]-  Genetic testing was normal, and  did not reveal a deleterious mutation in these genes.       07/08/2016 Imaging    CT CAP- 1. Small volume ascites, significantly decreased. 2. Stable diffuse omental soft tissue caking and diffuse peritoneal thickening along the bilateral paracolic gutters and bilateral pelvic peritoneal reflections, consistent with peritoneal carcinomatosis. 3. Stable asymmetrically enlarged right ovary, which may represent the primary site of ovarian malignancy. 4. No evidence of metastatic disease in the chest. No new sites of metastatic disease in the abdomen or pelvis.      07/09/2016 Miscellaneous    Gyn Onc re-evaluation- modest response to therapy, 3 more cycles of chemotherapy recommended.        09/11/2016 Imaging    CT C/A/P No significant change omental soft tissue caking, consistent with metastatic disease. Mild ascites is decreased since previous study.  Increased calcification along peritoneal surface in pelvic cul-de-sac, consistent with treated peritoneal metastatic disease.  Stable 4.5cm homogeneous right pelvic mass, which favors a uterine fibroid although right ovarian neoplasm cannot definitely be excluded.  No new or progressive metastatic disease identified. No evidence of metastatic disease within the thorax.       10/14/2016 Procedure    Robotic-assisted laparoscopic total hysterectomy with bilateral salpingoophorectomy, ex lap omentectomy, radical tumor debulking by Dr. Denman George      10/17/2016 Pathology Results    Diagnosis 1. 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 - CERVIX AND ENDOMETRIUM FREE OF TUMOR. - SEE ONCOLOGY  TABLE AND COMMENT. 2. Soft tissue, biopsy, umbilical nodule - HIGH GRADE SEROUS CARCINOMA. 3. Omentum, resection for tumor - HIGH GRADE SEROUS CARCINOMA, 33 CM.      11/06/2016 - 12/25/2016 Chemotherapy    Carboplatin/Paclitaxel x 3 cycles         01/12/2017 Imaging    CT CAP- 1. Interval hysterectomy, bilateral salpingo-oophorectomy and omentectomy without evidence of tumor recurrence. 2. No evidence of metastatic disease. 3. 5 mm nonobstructing lower pole left renal calculus.      01/13/2017 Remission    No evidence of residual disease on CT imaging.      INTERVAL HISTORY: Ellen Hunt 66 y.o. 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 Denman George on 10/14/2016 with R1 residual disease.  She is doing well today. She has been very tired. She had her shingles vaccine last week and had a lot of muscle pain in her left armfor 3 days after.  She reports back pain that started yesterday. She believes she pulled it yesterday. She had back surgery in 2016. She reports some constipation that is relieved with MiraLax. Denies chest pain, SOB, abdominal pain, or any other concerns.   Review of Systems  Constitutional: Positive for malaise/fatigue.  HENT: Negative.   Eyes: Negative.   Respiratory: Negative.  Negative for shortness of breath.   Cardiovascular: Negative.  Negative for chest pain.  Gastrointestinal: Positive for constipation. Negative for abdominal pain.  Genitourinary: Negative.   Musculoskeletal: Positive for back pain and myalgias (L arm from shingles vaccine).  Skin: Negative.   Neurological: Negative.   Endo/Heme/Allergies: Negative.   Psychiatric/Behavioral: Negative.   All other systems reviewed and are negative. 14 point review of systems was performed and is negative except as detailed under history of present illness and above  Past Medical History:  Diagnosis Date  . Diabetes mellitus without complication (HCC)    on insulin pump  . Dysrhythmia   . 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[place])  05/09/2016  . Family history of breast cancer   . GERD (gastroesophageal reflux disease)   . History of blood transfusion   . History of bronchitis   . History of chemotherapy   . History of urinary tract infection   . Hyperlipidemia   . Hypertension   . Low serum vitamin D   . Ovarian cancer ([place]) 05/09/2016  . Shingles     Past Surgical History:  Procedure Laterality Date  . CESAREAN SECTION    . DEBULKING N/A 10/14/2016   Procedure: DEBULKING;  Surgeon: Everitt Amber, MD;  Location: [facility];  Service: Gynecology;  Laterality: N/A;  . LAPAROTOMY WITH STAGING N/A 10/14/2016   Procedure: LAPAROTOMY WITH OMENTECTOMY AND TUMOR DEBULGING;  Surgeon: Everitt Amber, MD;  Location: [facility];  Service: Gynecology;  Laterality: N/A;  . LUMBAR FUSION  08/21/15   L3-L4 Dr. Timmothy Euler  . OMENTECTOMY N/A 10/14/2016   Procedure: OMENTECTOMY;  Surgeon: Everitt Amber, MD;  Location: [facility];  Service: Gynecology;  Laterality: N/A;  . ROBOTIC ASSISTED TOTAL HYSTERECTOMY WITH BILATERAL SALPINGO OOPHERECTOMY Bilateral 10/14/2016   Procedure: XI ROBOTIC ASSISTED TOTAL LAPARSCOPIC  HYSTERECTOMY WITH BILATERAL SALPINGO OOPHORECTOMY;  Surgeon: Everitt Amber, MD;  Location: [facility];  Service: Gynecology;  Laterality: Bilateral;    Family History  Problem Relation Age of Onset  . Lung cancer Mother     smoker; dx  in her 34s  . Leukemia Father   . Diabetes Paternal Grandmother   . Heart attack Paternal Grandmother   . Diabetes Paternal Grandfather   . Breast cancer Paternal Aunt     dx in her 58s-30s  . Leukemia Paternal Uncle   . Heart attack Maternal Grandfather   . Breast cancer Cousin     maternal first cousin    Social History   Social History  . Marital status: Married    Spouse name: Gershon Mussel  . Number of children: 2  . Years of education: N/A   Occupational History  . retired    Social History Main Topics  . Smoking status: Never Smoker  . Smokeless tobacco: Never Used  . Alcohol use No  . Drug  use: No  . Sexual activity: Not Asked     Comment: married   Other Topics Concern  . None   Social History Narrative  . None     PHYSICAL EXAMINATION ECOG PERFORMANCE STATUS: 1 - Symptomatic but completely ambulatory  BP 131/64 (BP Location: Left Arm, Patient Position: Sitting)   Pulse 95   Temp 98.5 F (36.9 C) (Oral)   Resp 20   Wt 212 lb 1.6 oz (96.2 kg)   SpO2 96%   BMI 37.57 kg/m   Physical Exam  Constitutional: She is oriented to person, place, and time and well-developed, well-nourished, and in no distress.  HENT:  Head: Normocephalic and atraumatic.  Eyes: Conjunctivae and EOM are normal. Pupils are equal, round, and reactive to light.  Neck: Normal range of motion. Neck supple.  Cardiovascular: Normal rate, regular rhythm and normal heart sounds.   Pulmonary/Chest: Effort normal and breath sounds normal.  Abdominal: Soft. Bowel sounds are normal.  Musculoskeletal: Normal range of motion.  Neurological: She is alert and oriented to person, place, and time. Gait normal.  Skin: Skin is warm and dry.  Nursing note and vitals reviewed.  LABORATORY DATA: CBC    Component Value Date/Time   WBC 4.2 01/14/2017 1115   RBC 2.85 (L) 01/14/2017 1115   HGB 9.3 (L) 01/14/2017 1115   HCT 27.8 (L) 01/14/2017 1115   HCT 27.6 (L) 11/18/2016 1642   PLT 55 (L) 01/14/2017 1115   PLT 40 (LL) 11/18/2016 1642   MCV 97.5 01/14/2017 1115   MCV 99 (H) 11/18/2016 1642   MCH 32.6 01/14/2017 1115   MCHC 33.5 01/14/2017 1115   RDW 23.6 (H) 01/14/2017 1115   RDW 19.5 (H) 11/18/2016 1642   LYMPHSABS 0.4 (L) 01/14/2017 1115   LYMPHSABS 0.4 (L) 11/18/2016 1642   MONOABS 0.6 01/14/2017 1115   EOSABS 0.0 01/14/2017 1115   EOSABS 0.1 11/18/2016 1642   BASOSABS 0.0 01/14/2017 1115   BASOSABS 0.0 11/18/2016 1642     Chemistry      Component Value Date/Time   NA 137 01/14/2017 1115   NA 139 02/06/2016 1148   K 4.2 01/14/2017 1115   CL 104 01/14/2017 1115   CO2 24 01/14/2017 1115     BUN 18 01/14/2017 1115   BUN 21 02/06/2016 1148   CREATININE 0.73 01/14/2017 1115      Component Value Date/Time   CALCIUM 9.3 01/14/2017 1115   ALKPHOS 86 01/14/2017 1115   AST 21 01/14/2017 1115   ALT 21 01/14/2017 1115   BILITOT 0.3 01/14/2017 1115   BILITOT 0.3 07/02/2015 1005     Lab Results  Component Value Date   CA125 116.1 (H) 01/14/2017    Ref  Range & Units 70mo ago (01/14/17) 13mo ago (09/18/16) 49mo ago (08/28/16) 79mo ago (08/07/16)    CA 125 0.0 - 38.1 U/mL 116.1   1,445.0CM   1,835.0CM   2,751.0CM      RADIOGRAPHIC STUDIES: I have personally reviewed the radiological images as listed and agreed with the findings in the report.  CT CAP w Contrast 01/12/2017 IMPRESSION: 1. Interval hysterectomy, bilateral salpingo-oophorectomy and omentectomy without evidence of tumor recurrence. 2. No evidence of metastatic disease. 3. 5 mm nonobstructing lower pole left renal calculus.  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 Denman George on 10/14/2016 with R1 residual disease. She then underwent 3 additional cycles of Carboplatin/Paclitaxel from 11/06/2016- 12/25/2016, followed by CT imaging on 01/12/2017 that demonstrated NED. Dr. Denman George is in agreement regarding stopping chemotherapy and continue active surveillance at this time.  PLAN: Clinically NED on exam today. She's had a significant decrease in her CA 125 since receiving carbo/taxol. I have discussed with her again regarding active surveillance with f/u every 2-3 months. No imaging unless she is symptomatic or her CA-125 starts going up.  I have told the patient to call if she should experience any persistent symptoms including abdominal pain/bloating. Port flush today and every 8 weeks.  Continue monthly B12  injections.  Refilled her lasix and pain meds today. She will return for follow up in 12 weeks with labs below.   Orders Placed This Encounter  Procedures  . CBC with Differential    Standing Status:   Future    Standing Expiration Date:   02/25/2018  . Comprehensive metabolic panel    Standing Status:   Future    Standing Expiration Date:   02/25/2018  . CA 125    Standing Status:   Future    Standing Expiration Date:   02/25/2018   All questions were answered. The patient knows to call the clinic with any problems, questions or concerns. We can certainly see the patient much sooner if necessary.  This document serves as a record of services personally performed by Twana First, MD. It was created on her behalf by Martinique Casey, a trained medical scribe. The creation of this record is based on the scribe's personal observations and the provider's statements to them. This document has been checked and approved by the attending provider.  I have reviewed the above documentation for accuracy and completeness and I agree with the above.  This note is electronically signed by: Martinique M Casey 02/25/2017 2:42 PM

## 2017-02-25 NOTE — Patient Instructions (Addendum)
[place] at [facility] Discharge Instructions  RECOMMENDATIONS MADE BY THE CONSULTANT AND ANY TEST RESULTS WILL BE SENT TO YOUR REFERRING PHYSICIAN.  You saw Dr. Talbert Cage today. Continue B 12 monthly. Follow up in 3 months. Port Flush every 8 weeks  Thank you for choosing [place] at [facility] to provide your oncology and hematology care.  To afford each patient quality time with our provider, please arrive at least 15 minutes before your scheduled appointment time.    If you have a lab appointment with the [place] please come in thru the  Main Entrance and check in at the main information desk  You need to re-schedule your appointment should you arrive 10 or more minutes late.  We strive to give you quality time with our providers, and arriving late affects you and other patients whose appointments are after yours.  Also, if you no show three or more times for appointments you may be dismissed from the clinic at the providers discretion.     Again, thank you for choosing [facility].  Our hope is that these requests will decrease the amount of time that you wait before being seen by our physicians.       _____________________________________________________________  Should you have questions after your visit to [facility], please contact our office at [phone] between the hours of 8:30 a.m. and 4:30 p.m.  Voicemails left after 4:30 p.m. will not be returned until the following business day.  For prescription refill requests, have your pharmacy contact our office.       Resources For Cancer Patients and their Caregivers ? American Cancer Society: Can assist with transportation, wigs, general needs, runs Look Good Feel Better.        [phone] ? Cancer Care: Provides financial assistance, online support groups, medication/co-pay assistance.  1-800-813-HOPE (715)147-0299) ? [place] Assists [place] cancer patients and their families through emotional , educational and financial support.  (609)249-0680 ? [place] DSS Where to apply for food stamps, Medicaid and utility assistance. 480-383-0557 ? [employer]: Transportation to medical appointments. 737 615 6398 ? Social Security Administration: May apply for disability if have a Stage IV cancer. [phone] ? [employer], Disability and Transit Services: Assists with nutrition, care and transit needs. [place] Support Programs: @10RELATIVEDAYS @ > Cancer Support Group  2nd Tuesday of the month 1pm-2pm, Journey Room  > Creative Journey  3rd Tuesday of the month 1130am-1pm, Journey Room  > Look Good Feel Better  1st Wednesday of the month 10am-12 noon, Journey Room (Call [place] to register [phone])

## 2017-02-26 ENCOUNTER — Other Ambulatory Visit (HOSPITAL_COMMUNITY): Payer: Self-pay | Admitting: Oncology

## 2017-02-26 LAB — CA 125: CA 125: 103.4 U/mL — AB (ref 0.0–38.1)

## 2017-03-16 ENCOUNTER — Ambulatory Visit: Payer: Self-pay | Admitting: Pediatrics

## 2017-03-25 ENCOUNTER — Ambulatory Visit (INDEPENDENT_AMBULATORY_CARE_PROVIDER_SITE_OTHER): Payer: BC Managed Care – PPO | Admitting: Pediatrics

## 2017-03-25 ENCOUNTER — Encounter: Payer: Self-pay | Admitting: Pediatrics

## 2017-03-25 VITALS — BP 119/66 | HR 77 | Temp 97.6°F | Ht 63.0 in | Wt 213.0 lb

## 2017-03-25 DIAGNOSIS — G629 Polyneuropathy, unspecified: Secondary | ICD-10-CM

## 2017-03-25 DIAGNOSIS — Z794 Long term (current) use of insulin: Secondary | ICD-10-CM

## 2017-03-25 DIAGNOSIS — E119 Type 2 diabetes mellitus without complications: Secondary | ICD-10-CM

## 2017-03-25 DIAGNOSIS — I10 Essential (primary) hypertension: Secondary | ICD-10-CM

## 2017-03-25 DIAGNOSIS — E785 Hyperlipidemia, unspecified: Secondary | ICD-10-CM | POA: Diagnosis not present

## 2017-03-25 DIAGNOSIS — Z1159 Encounter for screening for other viral diseases: Secondary | ICD-10-CM | POA: Diagnosis not present

## 2017-03-25 LAB — BAYER DCA HB A1C WAIVED: HB A1C: 6.3 % (ref <7.0)

## 2017-03-25 MED ORDER — ATORVASTATIN CALCIUM 40 MG PO TABS: 40.0000 mg | ORAL_TABLET | Freq: Every day | ORAL | 1 refills | Status: DC

## 2017-03-25 MED ORDER — METFORMIN HCL 1000 MG PO TABS: ORAL_TABLET | ORAL | 1 refills | Status: DC

## 2017-03-25 MED ORDER — INSULIN ASPART 100 UNIT/ML ~~LOC~~ SOLN: SUBCUTANEOUS | 0 refills | Status: DC

## 2017-03-25 MED ORDER — BENAZEPRIL HCL 10 MG PO TABS: 10.0000 mg | ORAL_TABLET | Freq: Every day | ORAL | 1 refills | Status: DC

## 2017-03-25 MED ORDER — GABAPENTIN 100 MG PO CAPS: 100.0000 mg | ORAL_CAPSULE | Freq: Every day | ORAL | 3 refills | Status: DC

## 2017-03-25 NOTE — Progress Notes (Signed)
  Subjective:   Patient ID: Ellen Hunt, female    DOB: 1951-05-15, 66 y.o.   MRN: [id number] CC: Establish Care and Diabetes  HPI: Ellen Hunt is a 66 y.o. female presenting for Establish Care and Diabetes  DM2: A1c 6.1 last visit On metformin, novolog pump Checking BGLs regularly This fasting morning 158, sometimes up to 200s but rarely No lows recently  H/o primary peritoneal carcinomatosis, s/p resection Following with Dr. Talbert Cage and Dr. Cristal Generous chemotherapy 10 weeks ago Next appt in July for f/u with oncology  HTN: no HA, no CP, takes meds regularly  HLD: on statin, tolerating  Feet have started burning at night over the past few weeks Bothering her some, keeping her awake at times  Relevant past medical, surgical, family and social history reviewed. Allergies and medications reviewed and updated. History  Smoking Status  . Never Smoker  Smokeless Tobacco  . Never Used   ROS: Per HPI   Objective:    BP 119/66   Pulse 77   Temp 97.6 F (36.4 C) (Oral)   Ht 5\' 3"  (1.6 m)   Wt 213 lb (96.6 kg)   BMI 37.73 kg/m   Wt Readings from Last 3 Encounters:  03/25/17 213 lb (96.6 kg)  02/25/17 212 lb 1.6 oz (96.2 kg)  01/14/17 207 lb (93.9 kg)    Gen: NAD, alert, cooperative with exam, NCAT EYES: EOMI, no conjunctival injection, or no icterus ENT:  TMs pearly gray b/l, OP without erythema LYMPH: no cervical LAD CV: NRRR, normal S1/S2, no murmur, distal pulses 2+ b/l Resp: CTABL, no wheezes, normal WOB Abd: +BS, soft, NT, mildly distended. no guarding or organomegaly Ext: No edema, warm  Neuro: Alert and oriented, normal  Sensation feet b/l normal Skin: thickened yellow toenails throughout  Assessment & Plan:  Ellen Hunt was seen today for establish care and diabetes.  Diagnoses and all orders for this visit:  Type 2 diabetes mellitus treated with insulin ([place]) Well controlled, A1c 6.3 today Denies lows Cont insulin pump at current dosing -     Bayer  DCA Hb A1c Waived -     insulin aspart (NOVOLOG) 100 UNIT/ML injection; USE IN INSULIN PUMP AS DIRECTED (MAX DAILY DOSE IS 110 UNITS PER DAY) -     metFORMIN (GLUCOPHAGE) 1000 MG tablet; TAKE 1 TABLET BY MOUTH TWICE DAILY WITH A MEAL  Essential hypertension Well controlled, cont current meds -     benazepril (LOTENSIN) 10 MG tablet; Take 1 tablet (10 mg total) by mouth daily.  Hyperlipidemia, unspecified hyperlipidemia type Stable, cont below -     atorvastatin (LIPITOR) 40 MG tablet; Take 1 tablet (40 mg total) by mouth daily.  Need for hepatitis C screening test -     Hepatitis C antibody  Neuropathy Ongoing symptoms, trial of below -     gabapentin (NEURONTIN) 100 MG capsule; Take 1 capsule (100 mg total) by mouth at bedtime.   Follow up plan: Return in about 3 months (around 06/25/2017). Assunta Found, MD [place]

## 2017-03-26 LAB — HEPATITIS C ANTIBODY: HEP C VIRUS AB: 0.1 {s_co_ratio} (ref 0.0–0.9)

## 2017-03-27 ENCOUNTER — Ambulatory Visit (HOSPITAL_COMMUNITY): Payer: BC Managed Care – PPO

## 2017-04-03 ENCOUNTER — Telehealth: Payer: Self-pay | Admitting: Family Medicine

## 2017-04-03 NOTE — Telephone Encounter (Signed)
Pt reported, getting records

## 2017-04-06 ENCOUNTER — Encounter: Payer: Self-pay | Admitting: Pediatrics

## 2017-04-06 ENCOUNTER — Telehealth (HOSPITAL_COMMUNITY): Payer: Self-pay | Admitting: *Deleted

## 2017-04-06 ENCOUNTER — Other Ambulatory Visit (HOSPITAL_COMMUNITY): Payer: Self-pay | Admitting: Emergency Medicine

## 2017-04-06 DIAGNOSIS — C569 Malignant neoplasm of unspecified ovary: Secondary | ICD-10-CM

## 2017-04-06 MED ORDER — DIAZEPAM 5 MG PO TABS: 2.5000 mg | ORAL_TABLET | ORAL | 1 refills | Status: DC | PRN

## 2017-04-06 MED ORDER — HYDROCODONE-ACETAMINOPHEN 10-325 MG PO TABS: 1.0000 | ORAL_TABLET | ORAL | 0 refills | Status: DC | PRN

## 2017-04-06 NOTE — Progress Notes (Signed)
Refilled valium and hydrocodone.

## 2017-04-27 ENCOUNTER — Encounter (HOSPITAL_COMMUNITY): Payer: BC Managed Care – PPO | Attending: Oncology

## 2017-04-27 ENCOUNTER — Encounter (HOSPITAL_COMMUNITY): Payer: Self-pay

## 2017-04-27 DIAGNOSIS — C481 Malignant neoplasm of specified parts of peritoneum: Secondary | ICD-10-CM | POA: Diagnosis not present

## 2017-04-27 DIAGNOSIS — R935 Abnormal findings on diagnostic imaging of other abdominal regions, including retroperitoneum: Secondary | ICD-10-CM | POA: Insufficient documentation

## 2017-04-27 LAB — COMPREHENSIVE METABOLIC PANEL
ALT: 19 U/L (ref 14–54)
AST: 21 U/L (ref 15–41)
Albumin: 3.6 g/dL (ref 3.5–5.0)
Alkaline Phosphatase: 93 U/L (ref 38–126)
Anion gap: 7 (ref 5–15)
BILIRUBIN TOTAL: 0.5 mg/dL (ref 0.3–1.2)
BUN: 26 mg/dL — AB (ref 6–20)
CO2: 25 mmol/L (ref 22–32)
Calcium: 8.8 mg/dL — ABNORMAL LOW (ref 8.9–10.3)
Chloride: 105 mmol/L (ref 101–111)
Creatinine, Ser: 0.85 mg/dL (ref 0.44–1.00)
GFR calc non Af Amer: >60 mL/min (ref >60)
Glucose, Bld: 219 mg/dL — ABNORMAL HIGH (ref 65–99)
POTASSIUM: 4.3 mmol/L (ref 3.5–5.1)
Sodium: 137 mmol/L (ref 135–145)
TOTAL PROTEIN: 6.3 g/dL — AB (ref 6.5–8.1)

## 2017-04-27 LAB — CBC WITH DIFFERENTIAL/PLATELET
BASOS ABS: 0 10*3/uL (ref 0.0–0.1)
Basophils Relative: 0 %
EOS PCT: 9 %
Eosinophils Absolute: 0.4 10*3/uL (ref 0.0–0.7)
HEMATOCRIT: 31.8 % — AB (ref 36.0–46.0)
Hemoglobin: 10.5 g/dL — ABNORMAL LOW (ref 12.0–15.0)
LYMPHS ABS: 0.6 10*3/uL — AB (ref 0.7–4.0)
LYMPHS PCT: 13 %
MCH: 32.5 pg (ref 26.0–34.0)
MCHC: 33 g/dL (ref 30.0–36.0)
MCV: 98.5 fL (ref 78.0–100.0)
MONO ABS: 0.4 10*3/uL (ref 0.1–1.0)
MONOS PCT: 10 %
NEUTROS ABS: 3 10*3/uL (ref 1.7–7.7)
Neutrophils Relative %: 68 %
PLATELETS: 185 10*3/uL (ref 150–400)
RBC: 3.23 MIL/uL — ABNORMAL LOW (ref 3.87–5.11)
RDW: 13 % (ref 11.5–15.5)
WBC: 4.5 10*3/uL (ref 4.0–10.5)

## 2017-04-27 MED ORDER — SODIUM CHLORIDE 0.9% FLUSH
20.0000 mL | INTRAVENOUS | Status: DC | PRN
  Administered 2017-04-27: 20 mL via INTRAVENOUS
  Filled 2017-04-27: qty 20

## 2017-04-27 MED ORDER — HEPARIN SOD (PORK) LOCK FLUSH 100 UNIT/ML IV SOLN
500.0000 [IU] | Freq: Once | INTRAVENOUS | Status: AC
  Administered 2017-04-27: 500 [IU] via INTRAVENOUS

## 2017-04-27 MED ORDER — HEPARIN SOD (PORK) LOCK FLUSH 100 UNIT/ML IV SOLN
INTRAVENOUS | Status: AC
  Filled 2017-04-27: qty 5

## 2017-04-27 NOTE — Patient Instructions (Signed)
[name] Discharge Instructions  RECOMMENDATIONS MADE BY THE CONSULTANT AND ANY TEST RESULTS WILL BE SENT TO YOUR REFERRING PHYSICIAN.  Labs drawn from portacath then flushed per protocol. Follow-up as scheduled. Call clinic for any questions or concerns  Thank you for choosing [facility] at [facility] to provide your oncology and hematology care.  To afford each patient quality time with our provider, please arrive at least 15 minutes before your scheduled appointment time.    If you have a lab appointment with the Cancer Center please come in thru the  Main Entrance and check in at the main information desk  You need to re-schedule your appointment should you arrive 10 or more minutes late.  We strive to give you quality time with our providers, and arriving late affects you and other patients whose appointments are after yours.  Also, if you no show three or more times for appointments you may be dismissed from the clinic at the providers discretion.     Again, thank you for choosing [facility].  Our hope is that these requests will decrease the amount of time that you wait before being seen by our physicians.       _____________________________________________________________  Should you have questions after your visit to [facility], please contact our office at [phone] between the hours of 8:30 a.m. and 4:30 p.m.  Voicemails left after 4:30 p.m. will not be returned until the following business day.  For prescription refill requests, have your pharmacy contact our office.       Resources For Cancer Patients and their Caregivers ? American Cancer Society: Can assist with transportation, wigs, general needs, runs Look Good Feel Better.        1-888-227-6333 ? Cancer Care: Provides financial assistance, online support groups, medication/co-pay assistance.  1-800-813-HOPE (4673) ? [facility] Assists [place] cancer patients and their families through emotional , educational and financial support.  336-427-4357 ? [place] DSS Where to apply for food stamps, Medicaid and utility assistance. 336-342-1394 ? [facility]: Transportation to medical appointments. 336-347-2287 ? Social Security Administration: May apply for disability if have a Stage IV cancer. 336-342-7796 1-800-772-1213 ? [place] Aging, Disability and Transit Services: Assists with nutrition, care and transit needs. 336-349-2343  Cancer Center Support Programs: [email or web address]@ > Cancer Support Group  2nd Tuesday of the month 1pm-2pm, Journey Room  > Creative Journey  3rd Tuesday of the month 1130am-1pm, Journey Room  > Look Good Feel Better  1st Wednesday of the month 10am-12 noon, Journey Room (Call American Cancer Society to register 1-800-395-5775)   

## 2017-04-27 NOTE — Progress Notes (Signed)
Ellen Hunt tolerated port flush with lab draw well without complaints or incident. Port accessed with 20 gauge needle with blood drawn for labs ordered then flushed with 20 ml NS and 5 ml Heparin easily per protocol. VSS Pt discharged self ambulatory in satisfactory condition accompanied by her husband

## 2017-04-28 ENCOUNTER — Other Ambulatory Visit (HOSPITAL_COMMUNITY): Payer: Self-pay | Admitting: Oncology

## 2017-04-28 DIAGNOSIS — C481 Malignant neoplasm of specified parts of peritoneum: Secondary | ICD-10-CM

## 2017-04-28 LAB — CA 125: CA 125: 1633 U/mL — ABNORMAL HIGH (ref 0.0–38.1)

## 2017-05-04 ENCOUNTER — Ambulatory Visit (HOSPITAL_COMMUNITY)
Admission: RE | Admit: 2017-05-04 | Discharge: 2017-05-04 | Disposition: A | Discharge status: Home / Self Care | Payer: BC Managed Care – PPO | Source: Ambulatory Visit | Attending: Oncology | Admitting: Oncology

## 2017-05-04 ENCOUNTER — Encounter (HOSPITAL_COMMUNITY): Payer: Self-pay

## 2017-05-04 DIAGNOSIS — N200 Calculus of kidney: Secondary | ICD-10-CM | POA: Insufficient documentation

## 2017-05-04 DIAGNOSIS — R188 Other ascites: Secondary | ICD-10-CM | POA: Insufficient documentation

## 2017-05-04 DIAGNOSIS — C481 Malignant neoplasm of specified parts of peritoneum: Secondary | ICD-10-CM | POA: Diagnosis not present

## 2017-05-04 DIAGNOSIS — I2510 Atherosclerotic heart disease of native coronary artery without angina pectoris: Secondary | ICD-10-CM | POA: Insufficient documentation

## 2017-05-04 DIAGNOSIS — Z90722 Acquired absence of ovaries, bilateral: Secondary | ICD-10-CM | POA: Diagnosis not present

## 2017-05-04 DIAGNOSIS — I700 Atherosclerosis of aorta: Secondary | ICD-10-CM | POA: Insufficient documentation

## 2017-05-04 DIAGNOSIS — Z90710 Acquired absence of both cervix and uterus: Secondary | ICD-10-CM | POA: Diagnosis not present

## 2017-05-04 MED ORDER — IOPAMIDOL (ISOVUE-300) INJECTION 61%
INTRAVENOUS | Status: AC
  Filled 2017-05-04: qty 100

## 2017-05-04 MED ORDER — IOPAMIDOL (ISOVUE-300) INJECTION 61%
100.0000 mL | Freq: Once | INTRAVENOUS | Status: AC | PRN
  Administered 2017-05-04: 100 mL via INTRAVENOUS

## 2017-05-04 MED ORDER — HEPARIN SOD (PORK) LOCK FLUSH 100 UNIT/ML IV SOLN: 500.0000 [IU] | Freq: Once | INTRAVENOUS | Status: DC

## 2017-05-04 MED ORDER — HEPARIN SOD (PORK) LOCK FLUSH 100 UNIT/ML IV SOLN
INTRAVENOUS | Status: AC
  Filled 2017-05-04: qty 5

## 2017-05-04 NOTE — Assessment & Plan Note (Addendum)
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 Rossi on 10/14/2016 with R1 resection results.  She then underwent 3 additional cycle of Carboplatin/Paclitaxel from 11/06/2016- 12/25/2016.  Post-treatment CT scan on 01/12/2017 demonstrated NED but without normalization of CA125.  NOW with a significantly rising CA 125 resulting in restaging scans sooner than anticipated.  CT CAP on 05/04/2017 demonstrated small, but new, ascites suspicious for recurrence of disease (in light of a rapidly rising CA 125).  Labs on 04/27/2017: CBC diff, CMET, CA 125.  I personally reviewed and went over laboratory results with the patient.  The results are noted within this dictation.  Her CA125 is rising quickly.  I personally reviewed and went over radiographic studies with the patient.  The results are noted within this dictation.  I personally reviewed the images in PACS.  CT CAP for restaging purposes on 05/04/2017 demonstrate a new small ascites within the abdomen concerning for recurrence/relapse of disease.  I have discussed the case with Dr. Boles, radiology.  He confirms my interpretation of the scans and confirms that there is not enough ascites for paracentesis with cytology.    I have also discussed the case in brief with Melissa Cross, NP (Gyn Onc) and she reports that she will work on getting the patient an appointment soon with Gyn Onc in [place] for review of her case and recommendations regarding treatment moving forward.  Treatment options moving forward include (but not limited to) Carboplatin/Paclitaxel re-challenge (although she relapsed within 6 months), Carboplatin/liposomal doxorubicin.  Given her probable platinum resistance, essentially, other treatment options moving forward include a Taxane +/- Avastin,  liposomal doxorubicin +/- Avastin, topotecan +/- Avastin.  I would favor the liposomal doxorubicin + Avastin.  I briefly mentioned future chemotherapeutic intervention with the patient, but will defer selection of agent(s) to Gyn Onc.  Refer back to [place] [facility] for recommendation(s).  She has an appointment on Monday, 05/11/2017 at 3:45 PM with Dr. Rossi.  Return following Gyn Onc appointment to review recommendations.  

## 2017-05-04 NOTE — Progress Notes (Signed)
Ellen Maize, MD Ellen Hunt [id number]  Extraovarian primary peritoneal carcinoma [facility])  CURRENT THERAPY: Work-up for rising CA 125  INTERVAL HISTORY: Ellen Hunt 67 y.o. female returns for followup of Stage IIIC primary peritoneal carcinomatosis with slow, but progressive response to neoadjuvant chemotherapy consisting of Carboplatin/Paclitaxel x 7 cycles (05/15/2016- 09/18/2016) followed by optimal debulking cytoreductive surgery by Dr. Denman George on 10/14/2016 with R1 resection results.  She then underwent 3 additional cycle of Carboplatin/Paclitaxel from 11/06/2016- 12/25/2016.  Post-treatment CT scan on 01/12/2017 demonstrated NED. AND B12 deficiency, corrected with IM therapy and discontinued on 02/25/2017.  Normal antibody testing on 09/04/2016.    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[place])   04/29/2016 Imaging    CT abd/pelvis- Extensive omental caking as well as moderate amount of ascites within the abdomen most compatible with peritoneal metastatic disease, of unknown primary. This may potentially be ovarian or a GI in etiology.      04/30/2016 Tumor Marker    CA 125- 7149.0 (H)      05/01/2016 Procedure    US paracentesis- Successful ultrasound-guided paracentesis yielding 1.8 liters of peritoneal fluid.      05/01/2016 Imaging    US pelvis- Both transabdominal and transvaginal sonography are significantly limited by large patient habitus and ascites. Neither uterus or ovaries were visualized on this exam.      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      05/08/2016 Imaging    CT chest- No evidence of metastatic disease in the chest. Peritoneal/omental disease with abdominal ascites in the upper abdomen, incompletely visualized.       05/13/2016 Procedure    Placement of single lumen port a cath via right internal jugular  vein. The catheter tip lies at the cavoatrial junction. A power injectable port a cath was placed and is ready for immediate use.      05/15/2016 Procedure    US Paracentesis- 3400 ml yellow colored ascites removed      05/15/2016 - 09/18/2016 Chemotherapy    Carboplatin/Paclitaxel every 21 days x 7 cycles      07/01/2016 Miscellaneous    Genetic Counseling by [name]-  Genetic testing was normal, and did not reveal a deleterious mutation in these genes.       07/08/2016 Imaging    CT CAP- 1. Small volume ascites, significantly decreased. 2. Stable diffuse omental soft tissue caking and diffuse peritoneal thickening along the bilateral paracolic gutters and bilateral pelvic peritoneal reflections, consistent with peritoneal carcinomatosis. 3. Stable asymmetrically enlarged right ovary, which may represent the primary site of ovarian malignancy. 4. No evidence of metastatic disease in the chest. No new sites of metastatic disease in the abdomen or pelvis.      07/09/2016 Miscellaneous    Gyn Onc re-evaluation- modest response to therapy, 3 more cycles of chemotherapy recommended.        09/11/2016 Imaging    CT C/A/P No significant change omental soft tissue caking, consistent with metastatic disease. Mild ascites is decreased since previous study.  Increased calcification along peritoneal surface in pelvic cul-de-sac, consistent with treated peritoneal metastatic disease.  Stable 4.5cm homogeneous right pelvic mass, which favors a uterine fibroid although right ovarian neoplasm cannot definitely be excluded.  No new or progressive metastatic disease identified. No evidence of metastatic disease within the thorax.       10/14/2016 Procedure  Robotic-assisted laparoscopic total hysterectomy with bilateral salpingoophorectomy, ex lap omentectomy, radical tumor debulking by Dr. Denman George      10/17/2016 Pathology Results    Diagnosis 1. Uterus +/- tubes/ovaries,  neoplastic - HIGH GRADE SEROUS CARCINOMA INVOLVING SEROSA OF UTERUS, BILATERAL FALLOPIAN TUBES AND BILATERAL OVARIES. - CERVIX AND ENDOMETRIUM FREE OF TUMOR. - SEE ONCOLOGY TABLE AND COMMENT. 2. Soft tissue, biopsy, umbilical nodule - HIGH GRADE SEROUS CARCINOMA. 3. Omentum, resection for tumor - HIGH GRADE SEROUS CARCINOMA, 33 CM.      11/06/2016 - 12/25/2016 Chemotherapy    Carboplatin/Paclitaxel x 3 cycles       01/12/2017 Imaging    CT CAP- 1. Interval hysterectomy, bilateral salpingo-oophorectomy and omentectomy without evidence of tumor recurrence. 2. No evidence of metastatic disease. 3. 5 mm nonobstructing lower pole left renal calculus.      01/13/2017 Remission    No evidence of residual disease on CT imaging.      05/04/2017 Imaging    CT CAP- New small amount of ascites within the abdomen and pelvis since 01/12/2017 which could indicate disease progression but no new identifiable tumor and no significant change in omental and mild peritoneal thickening.  No evidence of metastatic disease within the chest.  Coronary artery disease.  Aortic Atherosclerosis (ICD10-I70.0).       HPI Elements   Location: Peritoneum  Quality: High grade serous carcinoma  Severity: Stage IIIC  Duration: Dx in July 2017  Context: Now with rising CA 125  Timing:   Modifying Factors: Concern for platinum resistance.    Associated Signs & Symptoms: Malignant ascites at time of dx.    Ellen Hunt was upset today about the logistics of her learning of her CA-125 level in June 2018.  She was advised that I was on vacation and therefore her CA 125 was released to her MyChart account late in the evening with a nursing note/response to call the patient the following day with the information.  Since Ellen Hunt has her MyChart set-up to notify her when a new result is released, she learned of her test result prior to nursing being able to contact her in person.  Despite my explanation, she was not  pleased or realistic.  She also expressed frustration about the mild changes in follow-up plans between providers.  She is reminded that she is to remain her own advocate.  Due to the aforementioned, I opted to not release her CT scan result yesterday afternoon and instead called radiology to review results and discussed with Joylene John, NP (Gyn Onc) to establish follow-up ASAP.  She denies any nausea or vomiting.  She denies any abdominal pain.  She notes a good appetite.  She denies any chest pain or changes in bowels.  Review of Systems  Constitutional: Negative.  Negative for chills, fever and weight loss.  HENT: Negative.   Eyes: Negative.   Respiratory: Negative.  Negative for cough.   Cardiovascular: Negative.  Negative for chest pain.  Gastrointestinal: Negative.  Negative for blood in stool, constipation, diarrhea, melena, nausea and vomiting.  Genitourinary: Negative.   Musculoskeletal: Negative.   Skin: Negative.   Neurological: Negative.  Negative for weakness.  Endo/Heme/Allergies: Negative.   Psychiatric/Behavioral: The patient is nervous/anxious.     Past Medical History:  Diagnosis Date  . Diabetes mellitus without complication (HCC)    on insulin pump  . Dysrhythmia   . 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[place]) 05/09/2016  . Family history of breast cancer   . GERD (gastroesophageal reflux  disease)   . History of blood transfusion   . History of bronchitis   . History of chemotherapy   . History of urinary tract infection   . Hyperlipidemia   . Hypertension   . Low serum vitamin D   . Ovarian cancer ([place]) 05/09/2016  . Shingles     Past Surgical History:  Procedure Laterality Date  . ABDOMINAL HYSTERECTOMY  10/14/2016  . CESAREAN SECTION    . DEBULKING N/A 10/14/2016   Procedure: DEBULKING;  Surgeon: Everitt Amber, MD;  Location: [facility];  Service: Gynecology;  Laterality: N/A;  . LAPAROTOMY WITH STAGING N/A 10/14/2016   Procedure: LAPAROTOMY WITH  OMENTECTOMY AND TUMOR DEBULGING;  Surgeon: Everitt Amber, MD;  Location: [facility];  Service: Gynecology;  Laterality: N/A;  . LUMBAR FUSION  08/21/15   L3-L4 Dr. Timmothy Euler  . OMENTECTOMY N/A 10/14/2016   Procedure: OMENTECTOMY;  Surgeon: Everitt Amber, MD;  Location: [facility];  Service: Gynecology;  Laterality: N/A;  . ROBOTIC ASSISTED TOTAL HYSTERECTOMY WITH BILATERAL SALPINGO OOPHERECTOMY Bilateral 10/14/2016   Procedure: XI ROBOTIC ASSISTED TOTAL LAPARSCOPIC  HYSTERECTOMY WITH BILATERAL SALPINGO OOPHORECTOMY;  Surgeon: Everitt Amber, MD;  Location: [facility];  Service: Gynecology;  Laterality: Bilateral;    Family History  Problem Relation Age of Onset  . Lung cancer Mother        smoker; dx in her 38s  . Leukemia Father   . Diabetes Paternal Grandmother   . Heart attack Paternal Grandmother   . Diabetes Paternal Grandfather   . Breast cancer Paternal Aunt        dx in her 61s-30s  . Leukemia Paternal Uncle   . Heart attack Maternal Grandfather   . Breast cancer Cousin        maternal first cousin    Social History   Social History  . Marital status: Married    Spouse name: Gershon Mussel  . Number of children: 2  . Years of education: N/A   Occupational History  . retired    Social History Main Topics  . Smoking status: Never Smoker  . Smokeless tobacco: Never Used  . Alcohol use No  . Drug use: No  . Sexual activity: Not Asked     Comment: married   Other Topics Concern  . None   Social History Narrative  . None     PHYSICAL EXAMINATION  ECOG PERFORMANCE STATUS: 1 - Symptomatic but completely ambulatory  Vitals:   05/05/17 0836  BP: (!) 140/53  Pulse: 78  Resp: 18  Temp: 98.1 F (36.7 C)    GENERAL:alert, no distress, well nourished, well developed, comfortable, cooperative, obese, anxious/nervous and upset/frustrated, accompanied by husband who is also upset. SKIN: skin color, texture, turgor are normal, no rashes or significant lesions HEAD: Normocephalic, No masses,  lesions, tenderness or abnormalities EYES: normal, EOMI, Conjunctiva are pink and non-injected EARS: External ears normal OROPHARYNX:lips, buccal mucosa, and tongue normal and mucous membranes are moist  NECK: supple, trachea midline LYMPH:  no palpable lymphadenopathy BREAST:not examined LUNGS: clear to auscultation  HEART: regular rate & rhythm, no murmurs and no gallops ABDOMEN:abdomen soft, obese and normal bowel sounds BACK: Back symmetric, no curvature. EXTREMITIES:less then 2 second capillary refill, no skin discoloration, no cyanosis  NEURO: alert & oriented x 3 with fluent speech, no focal motor/sensory deficits, gait normal   LABORATORY DATA: CBC    Component Value Date/Time   WBC 4.5 04/27/2017 1003   RBC 3.23 (L) 04/27/2017 1003   HGB  10.5 (L) 04/27/2017 1003   HGB 9.4 (L) 11/18/2016 1642   HCT 31.8 (L) 04/27/2017 1003   HCT 27.6 (L) 11/18/2016 1642   PLT 185 04/27/2017 1003   PLT 40 (LL) 11/18/2016 1642   MCV 98.5 04/27/2017 1003   MCV 99 (H) 11/18/2016 1642   MCH 32.5 04/27/2017 1003   MCHC 33.0 04/27/2017 1003   RDW 13.0 04/27/2017 1003   RDW 19.5 (H) 11/18/2016 1642   LYMPHSABS 0.6 (L) 04/27/2017 1003   LYMPHSABS 0.4 (L) 11/18/2016 1642   MONOABS 0.4 04/27/2017 1003   EOSABS 0.4 04/27/2017 1003   EOSABS 0.1 11/18/2016 1642   BASOSABS 0.0 04/27/2017 1003   BASOSABS 0.0 11/18/2016 1642      Chemistry      Component Value Date/Time   NA 137 04/27/2017 1003   NA 139 02/06/2016 1148   K 4.3 04/27/2017 1003   CL 105 04/27/2017 1003   CO2 25 04/27/2017 1003   BUN 26 (H) 04/27/2017 1003   BUN 21 02/06/2016 1148   CREATININE 0.85 04/27/2017 1003      Component Value Date/Time   CALCIUM 8.8 (L) 04/27/2017 1003   ALKPHOS 93 04/27/2017 1003   AST 21 04/27/2017 1003   ALT 19 04/27/2017 1003   BILITOT 0.5 04/27/2017 1003   BILITOT 0.3 07/02/2015 1005      Lab Results  Component Value Date   CA125 1,633.0 (H) 04/27/2017     PENDING  LABS:   RADIOGRAPHIC STUDIES:  Ct Chest W Contrast  Result Date: 05/04/2017 CLINICAL DATA:  66 year old female for restaging of ovarian cancer. Chemotherapy completed 01/2017. Rising CA 125. EXAM: CT CHEST, ABDOMEN, AND PELVIS WITH CONTRAST TECHNIQUE: Multidetector CT imaging of the chest, abdomen and pelvis was performed following the standard protocol during bolus administration of intravenous contrast. CONTRAST:  129m ISOVUE-300 IOPAMIDOL (ISOVUE-300) INJECTION 61% COMPARISON:  01/12/2017 and prior CTs FINDINGS: CT CHEST FINDINGS Cardiovascular: Mild coronary artery and thoracic aortic atherosclerotic calcifications again noted. There is no evidence of thoracic aortic aneurysm. Normal heart size. No pericardial effusion. A right Port-A-Cath is present with tip in the lower SVC. Mediastinum/Nodes: No enlarged mediastinal, hilar, or axillary lymph nodes. Thyroid gland, trachea, and esophagus demonstrate no significant findings. Lungs/Pleura: Lungs are clear. No pleural effusion or pneumothorax. Musculoskeletal: No acute or suspicious bony abnormalities. CT ABDOMEN PELVIS FINDINGS Hepatobiliary: The liver and gallbladder are unremarkable. No biliary dilatation. Pancreas: Unremarkable Spleen: Unremarkable Adrenals/Urinary Tract: The kidneys, adrenal glands and bladder are unremarkable except for unchanged nonobstructing left lower pole renal calculus. Stomach/Bowel: No bowel obstruction or definite bowel wall thickening noted. Vascular/Lymphatic: Aortic atherosclerosis. No enlarged abdominal or pelvic lymph nodes. Reproductive: Patient is status post hysterectomy and bilateral oophorectomy. Other: A small amount of ascites adjacent to the liver and within the pelvis is new. Mild omental thickening/stranding and mild peritoneal thickening is unchanged. No definite new omental or peritoneal abnormality is noted. There is no evidence of pneumoperitoneum. Musculoskeletal: No acute or suspicious abnormalities.  Degenerative and postsurgical changes in the lumbar spine again noted. IMPRESSION: New small amount of ascites within the abdomen and pelvis since 01/12/2017 which could indicate disease progression but no new identifiable tumor and no significant change in omental and mild peritoneal thickening. No evidence of metastatic disease within the chest. Coronary artery disease. Aortic Atherosclerosis (ICD10-I70.0). Electronically Signed   By: JMargarette CanadaM.D.   On: 05/04/2017 14:56   Ct Abdomen Pelvis W Contrast  Result Date: 05/04/2017 CLINICAL DATA:  66year old female for  restaging of ovarian cancer. Chemotherapy completed 01/2017. Rising CA 125. EXAM: CT CHEST, ABDOMEN, AND PELVIS WITH CONTRAST TECHNIQUE: Multidetector CT imaging of the chest, abdomen and pelvis was performed following the standard protocol during bolus administration of intravenous contrast. CONTRAST:  150m ISOVUE-300 IOPAMIDOL (ISOVUE-300) INJECTION 61% COMPARISON:  01/12/2017 and prior CTs FINDINGS: CT CHEST FINDINGS Cardiovascular: Mild coronary artery and thoracic aortic atherosclerotic calcifications again noted. There is no evidence of thoracic aortic aneurysm. Normal heart size. No pericardial effusion. A right Port-A-Cath is present with tip in the lower SVC. Mediastinum/Nodes: No enlarged mediastinal, hilar, or axillary lymph nodes. Thyroid gland, trachea, and esophagus demonstrate no significant findings. Lungs/Pleura: Lungs are clear. No pleural effusion or pneumothorax. Musculoskeletal: No acute or suspicious bony abnormalities. CT ABDOMEN PELVIS FINDINGS Hepatobiliary: The liver and gallbladder are unremarkable. No biliary dilatation. Pancreas: Unremarkable Spleen: Unremarkable Adrenals/Urinary Tract: The kidneys, adrenal glands and bladder are unremarkable except for unchanged nonobstructing left lower pole renal calculus. Stomach/Bowel: No bowel obstruction or definite bowel wall thickening noted. Vascular/Lymphatic: Aortic  atherosclerosis. No enlarged abdominal or pelvic lymph nodes. Reproductive: Patient is status post hysterectomy and bilateral oophorectomy. Other: A small amount of ascites adjacent to the liver and within the pelvis is new. Mild omental thickening/stranding and mild peritoneal thickening is unchanged. No definite new omental or peritoneal abnormality is noted. There is no evidence of pneumoperitoneum. Musculoskeletal: No acute or suspicious abnormalities. Degenerative and postsurgical changes in the lumbar spine again noted. IMPRESSION: New small amount of ascites within the abdomen and pelvis since 01/12/2017 which could indicate disease progression but no new identifiable tumor and no significant change in omental and mild peritoneal thickening. No evidence of metastatic disease within the chest. Coronary artery disease. Aortic Atherosclerosis (ICD10-I70.0). Electronically Signed   By: JMargarette CanadaM.D.   On: 05/04/2017 14:56     PATHOLOGY:    ASSESSMENT AND PLAN:  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[place] Stage IIIC primary peritoneal carcinomatosis, BRCA NEGATIVE, with slow, but progressive response to neoadjuvant chemotherapy consisting of Carboplatin/Paclitaxel x 7 cycles (05/15/2016- 09/18/2016) followed by optimal debulking cytoreductive surgery by Dr. RDenman Georgeon 10/14/2016 with R1 resection results.  She then underwent 3 additional cycle of Carboplatin/Paclitaxel from 11/06/2016- 12/25/2016.  Post-treatment CT scan on 01/12/2017 demonstrated NED but without normalization of CA125.  NOW with a significantly rising CA 125 resulting in restaging scans sooner than anticipated.  CT CAP on 05/04/2017 demonstrated small, but new, ascites suspicious for recurrence of disease (in light of a rapidly rising CA 125).  Labs on 04/27/2017: CBC diff, CMET, CA 125.  I personally reviewed and went over laboratory results with the patient.  The results are noted within this dictation.  Her CA125 is rising  quickly.  I personally reviewed and went over radiographic studies with the patient.  The results are noted within this dictation.  I personally reviewed the images in PACS.  CT CAP for restaging purposes on 05/04/2017 demonstrate a new small ascites within the abdomen concerning for recurrence/relapse of disease.  I have discussed the case with Dr. BThornton Papas radiology.  He confirms my interpretation of the scans and confirms that there is not enough ascites for paracentesis with cytology.    I have also discussed the case in brief with MJoylene John NP (Gyn Onc) and she reports that she will work on getting the patient [place] [place] regarding treatment moving forward.  Treatment options moving forward include (but not  limited to) Carboplatin/Paclitaxel re-challenge (although she relapsed within 6 months), Carboplatin/liposomal doxorubicin.  Given her probable platinum resistance, essentially, other treatment options moving forward include a Taxane +/- Avastin, liposomal doxorubicin +/- Avastin, topotecan +/- Avastin.  I would favor the liposomal doxorubicin + Avastin.  I briefly mentioned future chemotherapeutic intervention with the patient, but will defer selection of agent(s) to Gyn Onc.  Refer back to [place] for recommendation(s).  She has an appointment on Monday, 05/11/2017 at 3:45 PM with Dr. Denman George.  Return following Gyn Onc appointment to review recommendations.   ORDERS PLACED FOR THIS ENCOUNTER: No orders of the defined types were placed in this encounter.   MEDICATIONS PRESCRIBED THIS ENCOUNTER: No orders of the defined types were placed in this encounter.   THERAPY PLAN:  Refer to [place] for treatment recommendations.  All questions were answered. The patient knows to call the clinic with any problems, questions or concerns. We can certainly see the patient much sooner if necessary.  Patient and plan  discussed with Dr. Twana First and she is in agreement with the aforementioned.   This note is electronically signed by: Doy Mince 05/05/2017 9:21 AM

## 2017-05-05 ENCOUNTER — Encounter (HOSPITAL_COMMUNITY): Payer: Self-pay | Admitting: Oncology

## 2017-05-05 ENCOUNTER — Encounter (HOSPITAL_COMMUNITY): Payer: BC Managed Care – PPO | Attending: Oncology | Admitting: Oncology

## 2017-05-05 ENCOUNTER — Telehealth: Payer: Self-pay | Admitting: *Deleted

## 2017-05-05 DIAGNOSIS — C481 Malignant neoplasm of specified parts of peritoneum: Secondary | ICD-10-CM | POA: Diagnosis not present

## 2017-05-05 DIAGNOSIS — R935 Abnormal findings on diagnostic imaging of other abdominal regions, including retroperitoneum: Secondary | ICD-10-CM | POA: Insufficient documentation

## 2017-05-05 NOTE — Telephone Encounter (Signed)
Late entry----patient called back on June 27th regarding the need for a follow up appt with Dr.Rossi. Patient stated that "I'm tired and ran down but my mammogram was clear. I have being gaining weight but my appetite hs been better. I received my CA 125 results via text message and the count went from 116 3 months ago, to 103 2 months ago, now it's up to 1600. Spoke to [employer] nurse a and they scheduled a CT for July 2nd and follow up with him on July 3rd. I'm upset with the process that is going on between [facility] and Dr. Talbert Cage. Dr. Denman George and Dr. Su Monks talked and had a plan. Then Dr. Talbert Cage comes in and changes the plan on my first visit with her. I'm not happy with that. But I will keep my scan and follow up appts and call you back."   Informed the patient that she can always transfer her care over to Dr. Alvy Bimler here at the [facility] office. Explained that both Dr. Denman George and Dr. Alvy Bimler work together. Patient stated that "I will think about it and call back after my scan." Appt made to see Dr. Denman George on July 9th.  Patient called back today and wants to keep her aappt on July 9th with Dr. Denman George and discuss moving her care to Dr. Alvy Bimler.Patient also stated "next week I am going to the [facility] in [place]."

## 2017-05-05 NOTE — Patient Instructions (Addendum)
[place] at [facility] Discharge Instructions  RECOMMENDATIONS MADE BY THE CONSULTANT AND ANY TEST RESULTS WILL BE SENT TO YOUR REFERRING PHYSICIAN.  You saw [name], PA-C, today. Keep Dr. Denman George appt on 05/11/17 at 3:45pm Follow up 7/12 with PA.   Thank you for choosing [place] at [facility] to provide your oncology and hematology care.  To afford each patient quality time with our provider, please arrive at least 15 minutes before your scheduled appointment time.    If you have a lab appointment with the [facility] please come in thru the  Main Entrance and check in at the main information desk  You need to re-schedule your appointment should you arrive 10 or more minutes late.  We strive to give you quality time with our providers, and arriving late affects you and other patients whose appointments are after yours.  Also, if you no show three or more times for appointments you may be dismissed from the clinic at the providers discretion.     Again, thank you for choosing [facility].  Our hope is that these requests will decrease the amount of time that you wait before being seen by our physicians.       _____________________________________________________________  Should you have questions after your visit to [facility], please contact our office at [phone] between the hours of 8:30 a.m. and 4:30 p.m.  Voicemails left after 4:30 p.m. will not be returned until the following business day.  For prescription refill requests, have your pharmacy contact our office.       Resources For Cancer Patients and their Caregivers ? American Cancer Society: Can assist with transportation, wigs, general needs, runs Look Good Feel Better.        4781518290 ? Cancer Care: Provides financial assistance, online support groups, medication/co-pay assistance.  1-800-813-HOPE 928-350-1366) ? [place] Assists [place] cancer patients and their families through emotional , educational and financial support.  (431) 238-6707 ? [place] DSS Where to apply for food stamps, Medicaid and utility assistance. 469-034-5041 ? [facility]: Transportation to medical appointments. 386 328 9639 ? Social Security Administration: May apply for disability if have a Stage IV cancer. 234 454 4941 657-587-1506 ? [employer], Disability and Transit Services: Assists with nutrition, care and transit needs. [place] Support Programs: @10RELATIVEDAYS @ > Cancer Support Group  2nd Tuesday of the month 1pm-2pm, Journey Room  > Creative Journey  3rd Tuesday of the month 1130am-1pm, Journey Room  > Look Good Feel Better  1st Wednesday of the month 10am-12 noon, Journey Room (Call [place] to register [phone])  .

## 2017-05-11 ENCOUNTER — Ambulatory Visit: Payer: BC Managed Care – PPO | Attending: Gynecologic Oncology | Admitting: Gynecologic Oncology

## 2017-05-11 ENCOUNTER — Encounter: Payer: Self-pay | Admitting: Gynecologic Oncology

## 2017-05-11 VITALS — BP 120/45 | HR 94 | Temp 97.8°F | Resp 20 | Ht 62.0 in | Wt 219.4 lb

## 2017-05-11 DIAGNOSIS — E119 Type 2 diabetes mellitus without complications: Secondary | ICD-10-CM | POA: Diagnosis not present

## 2017-05-11 DIAGNOSIS — Z90710 Acquired absence of both cervix and uterus: Secondary | ICD-10-CM | POA: Diagnosis not present

## 2017-05-11 DIAGNOSIS — C569 Malignant neoplasm of unspecified ovary: Secondary | ICD-10-CM | POA: Diagnosis not present

## 2017-05-11 DIAGNOSIS — R971 Elevated cancer antigen 125 [CA 125]: Secondary | ICD-10-CM | POA: Diagnosis not present

## 2017-05-11 DIAGNOSIS — Z87440 Personal history of urinary (tract) infections: Secondary | ICD-10-CM | POA: Insufficient documentation

## 2017-05-11 DIAGNOSIS — Z9221 Personal history of antineoplastic chemotherapy: Secondary | ICD-10-CM

## 2017-05-11 DIAGNOSIS — R590 Localized enlarged lymph nodes: Secondary | ICD-10-CM | POA: Diagnosis not present

## 2017-05-11 DIAGNOSIS — Z801 Family history of malignant neoplasm of trachea, bronchus and lung: Secondary | ICD-10-CM | POA: Insufficient documentation

## 2017-05-11 DIAGNOSIS — Z794 Long term (current) use of insulin: Secondary | ICD-10-CM | POA: Diagnosis not present

## 2017-05-11 DIAGNOSIS — Z806 Family history of leukemia: Secondary | ICD-10-CM | POA: Diagnosis not present

## 2017-05-11 DIAGNOSIS — R188 Other ascites: Secondary | ICD-10-CM | POA: Insufficient documentation

## 2017-05-11 DIAGNOSIS — E118 Type 2 diabetes mellitus with unspecified complications: Secondary | ICD-10-CM | POA: Diagnosis not present

## 2017-05-11 DIAGNOSIS — D6481 Anemia due to antineoplastic chemotherapy: Secondary | ICD-10-CM | POA: Diagnosis not present

## 2017-05-11 DIAGNOSIS — C5700 Malignant neoplasm of unspecified fallopian tube: Secondary | ICD-10-CM | POA: Diagnosis not present

## 2017-05-11 DIAGNOSIS — Z803 Family history of malignant neoplasm of breast: Secondary | ICD-10-CM | POA: Diagnosis not present

## 2017-05-11 NOTE — Progress Notes (Signed)
Follow-up Note: Gyn-Onc  Consult was requested by Dr. Whitney Muse for the evaluation of Ellen Hunt 66 y.o. female  CC:  Chief Complaint  Patient presents with  . Ovarian Cancer    Assessment/Plan:  Ms. Ellen Hunt  is a 66 y.o.  year old with stage IIIC ovarian/primary peritoneal/fallopian tube cancer with slow but progressive response to neoadjuvant chemotherapy with carboplatin and paclitaxel.  S/p 7 cycles adjuvant chemotherapy with carb/taxol. S/p interval optimal cytoreduction with hysterectomy, BSO, omentectomy on 10/14/16 with R1 residual disease. S/p 3 additional cycles of adjuvant chemotherapy with partial response. BRCA negative.  New recurrence with significant elevation in CA 125 and progressive peritoneal nodularity and small volume ascites.  I discussed that there are 2 options because her disease is asymptomatic. One would be to wait to treat with salvage chemotherapy when she becomes more symptomatic. The other option would be to proceed with salvage chemotherapy now.  She is electing to proceed with 2nd line chemotherapy. She is platinum resistant and therefore I would recommend an alternative regimen such as weekly taxol with bevacizumab, doxil, gemcitabine +/- platinum.   She is desiring to transfer care to Dr Alvy Bimler. We will facilitate this.  She has been told that her disease is likely not curable given the platinum resistant nature of it, however, her performance status is excellent and despite her diabetes I believe she is a candidate for salvage chemotherapy.  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  She has a history of feeling bloated and extended since June 1st, 2017. She was seen in the ED on 04/30/16 and a CT scan of the abdomen and pelvis was performed it revealed moderate volume ascites, masslike expansion of the uterine  fundus, no adnexal masses seen. There was extensive omental caking compatible with peritoneal metastatic disease. No pathologic adenopathy was identified. 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 The patient was seen and evaluated by Dr. Whitney Muse with a plan in place for chemotherapy when appropriate.   She is morbidly obese with a weight of 247lbs. She has poorly controlled DM (last HbA1C was >9%) though she has more recently been on tighter control of her glucose with metformin and insulin.  She was diagnosed with shingles above her left eye immediately prior to starting chemotherapy. Between 05/15/16 (day 1 cycle 1) and 06/26/16 (day 1 cycle 3) she has received 3 doses of neoadjuvant carboplatin and paclitaxel. Her CA 125 was 7149 on 05/01/16 and increased to 11,113 on 05/16/16 on day of chemotherapy initiation. It was increased to 12,359 prior to cycle 2 and decreased to 8,811 for day 1 of cycle 3.  Repeat CT scan after 3 cycles showed minimal improvement. Given her modest CA 125 and CT response to 3 cycles of chemotherapy, she was felt to not be a good candidate for interval debulking at that time.  She received an additional 3 cycles of carboplatin and paclitaxel (last dose on 08/28/16). She has been tolerating chemotherapy well though does have issues with anemia and has required 2 blood transfusions. CA 125 on day 1 of cycle 6 (08/28/16) was 1,835 (which is a steady reduction over the past 3 cycles).  CT imaging on 09/11/16 showed No significant change omental soft tissue caking, consistent with metastatic disease. Mild ascites is decreased since previous study.  Increased calcification along peritoneal surface in pelvic cul-de-sac, consistent with treated peritoneal metastatic disease. Stable 4.5cm homogeneous right pelvic mass, which favors a uterine fibroid although right ovarian  neoplasm cannot definitely be excluded.  No new or progressive metastatic disease identified. No evidence of metastatic disease within the thorax.Marland Kitchen  She went on to complete 7 cycles of adjuvant chemotherapy with carboplatin and paclitaxel (last dose on 09/18/16) with CA 125 1445.  On 10/14/16 she underwent robotic assisted total hysterectomy, BSO, minilaparotomy for omentectomy, radical tumor debulking to R1 residual disease (milliary disease throughout peritoneal cavity).  Surgical findings were significant for bulky omental caking, small volume disease in pelvis.  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  Postoperatively she did very well with no issues or infection.  Interval History:  She went on to receive 3 additional cycles of carboplatin and paclitaxel (total 10). She tolerated the therapy fairly well. Her CA 125 plateau'd at approximately 100 at the completion of therapy. Post treatment CT scan on 01/16/17 showed no measurable disease on imaging despite the persistently mild elevation in CA 125.  Her treatment was discontinued.   Her CA 125 was rechecked on 04/27/17 at the time of a port flush and was very elevated at 1633.  CT abdo/pelvis and chest on 05/04/17 showed small volume perihepatic ascites and increased nodularity in the peritoneum and omentum but no dominant mass.    Current Meds:  Outpatient Encounter Prescriptions as of 05/11/2017  Medication Sig  . atorvastatin (LIPITOR) 40 MG tablet Take 1 tablet (40 mg total) by mouth daily.  . benazepril (LOTENSIN) 10 MG tablet Take 1 tablet (10 mg total) by mouth daily.  . diazepam (VALIUM) 5 MG tablet Take 0.5 tablets (2.5 mg total) by mouth every 4 (four) hours as needed (spasms).  . furosemide (LASIX) 20 MG tablet Take 1 tablet (20 mg total) by mouth daily.  Marland Kitchen glucagon (GLUCAGON EMERGENCY) 1 MG injection Inject as directed as needed for low blood glucose if unresponsive.  Marland Kitchen  glucosamine-chondroitin 500-400 MG tablet Take 1 tablet by mouth 2 (two) times daily.  Marland Kitchen HYDROcodone-acetaminophen (NORCO) 10-325 MG tablet Take 1 tablet by mouth every 4 (four) hours as needed.  . insulin aspart (NOVOLOG) 100 UNIT/ML injection USE IN INSULIN PUMP AS DIRECTED (MAX DAILY DOSE IS 110 UNITS PER DAY)  . lidocaine-prilocaine (EMLA) cream Apply a quarter size amount to port site 1 hour prior to chemo. Do not rub in. Cover with plastic wrap.  . metFORMIN (GLUCOPHAGE) 1000 MG tablet TAKE 1 TABLET BY MOUTH TWICE DAILY WITH A MEAL  . Multiple Vitamin (MULTIVITAMIN WITH MINERALS) TABS Take 1 tablet by mouth daily.  . multivitamin-lutein (OCUVITE-LUTEIN) CAPS capsule Take 1 capsule by mouth daily.  . ONE TOUCH ULTRA TEST test strip USE TO CHECK BLOOD SUGAR UP TO 5 TIMES A DAY  . polyethylene glycol (MIRALAX / GLYCOLAX) packet Take 17 g by mouth daily.  Marland Kitchen senna (SENOKOT) 8.6 MG TABS tablet Take 1 tablet (8.6 mg total) by mouth at bedtime as needed for moderate constipation.  . vitamin E (VITAMIN E) 400 UNIT capsule Take 400 Units by mouth daily.   No facility-administered encounter medications on file as of 05/11/2017.     Allergy:  Allergies  Allergen Reactions  . Avandia [Rosiglitazone] Other (See Comments)    Legs swelled  . Micronase [Glyburide] Swelling  . Actos [Pioglitazone] Other (See Comments)    Edema / leg swelling    Social Hx:   Social  History   Social History  . Marital status: Married    Spouse name: Gershon Mussel  . Number of children: 2  . Years of education: N/A   Occupational History  . retired    Social History Main Topics  . Smoking status: Never Smoker  . Smokeless tobacco: Never Used  . Alcohol use No  . Drug use: No  . Sexual activity: Not on file     Comment: married   Other Topics Concern  . Not on file   Social History Narrative  . No narrative on file    Past Surgical Hx:  Past Surgical History:  Procedure Laterality Date  . ABDOMINAL  HYSTERECTOMY  10/14/2016  . CESAREAN SECTION    . DEBULKING N/A 10/14/2016   Procedure: DEBULKING;  Surgeon: Everitt Amber, MD;  Location: [facility];  Service: Gynecology;  Laterality: N/A;  . LAPAROTOMY WITH STAGING N/A 10/14/2016   Procedure: LAPAROTOMY WITH OMENTECTOMY AND TUMOR DEBULGING;  Surgeon: Everitt Amber, MD;  Location: [facility];  Service: Gynecology;  Laterality: N/A;  . LUMBAR FUSION  08/21/15   L3-L4 Dr. Timmothy Euler  . OMENTECTOMY N/A 10/14/2016   Procedure: OMENTECTOMY;  Surgeon: Everitt Amber, MD;  Location: [facility];  Service: Gynecology;  Laterality: N/A;  . ROBOTIC ASSISTED TOTAL HYSTERECTOMY WITH BILATERAL SALPINGO OOPHERECTOMY Bilateral 10/14/2016   Procedure: XI ROBOTIC ASSISTED TOTAL LAPARSCOPIC  HYSTERECTOMY WITH BILATERAL SALPINGO OOPHORECTOMY;  Surgeon: Everitt Amber, MD;  Location: [facility];  Service: Gynecology;  Laterality: Bilateral;    Past Medical Hx:  Past Medical History:  Diagnosis Date  . Diabetes mellitus without complication (HCC)    on insulin pump  . Dysrhythmia   . 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[place]) 05/09/2016  . Family history of breast cancer   . GERD (gastroesophageal reflux disease)   . History of blood transfusion   . History of bronchitis   . History of chemotherapy   . History of urinary tract infection   . Hyperlipidemia   . Hypertension   . Low serum vitamin D   . Ovarian cancer ([place]) 05/09/2016  . Shingles     Past Gynecological History:  No LMP recorded. Patient has had a hysterectomy.  Family Hx:  Family History  Problem Relation Age of Onset  . Lung cancer Mother        smoker; dx in her 57s  . Leukemia Father   . Diabetes Paternal Grandmother   . Heart attack Paternal Grandmother   . Diabetes Paternal Grandfather   . Breast cancer Paternal Aunt        dx in her 48s-30s  . Leukemia Paternal Uncle   . Heart attack Maternal Grandfather   . Breast cancer Cousin        maternal first cousin    Review of  Systems:  Constitutional  Feels fatigued  ENT Normal appearing ears and nares bilaterally Skin/Breast  resolved right temporal and eyebrow vesicular rash Cardiovascular  No chest pain, shortness of breath, or edema  Pulmonary  No cough or wheeze.  Gastro Intestinal  improved bloating, nausea, early satiety Genito Urinary  No frequency, urgency, dysuria, no bleeding Musculo Skeletal  No myalgia, arthralgia, joint swelling or pain  Neurologic  No weakness, numbness, change in gait,  Psychology  No depression, anxiety, insomnia.   Vitals:  Blood pressure (!) 120/45, pulse 94, temperature 97.8 F (36.6 C), temperature source Oral, resp. rate 20, height '5\' 2"'  (1.575 m), weight 219 lb 7 oz (99.5 kg), SpO2 97 %.  Physical Exam: WD in NAD Neck  Supple NROM, without any enlargements.  Lymph Node Survey No cervical supraclavicular or inguinal adenopathy Cardiovascular  Pulse normal rate, regularity and rhythm. S1 and S2 normal.  Lungs  Clear to auscultation bilateraly, without wheezes/crackles/rhonchi. Good air movement.  Skin  No rash/lesions/breakdown  Psychiatry  Alert and oriented to person, place, and time  Abdomen  Normoactive bowel sounds, abdomen soft, non-tender and obese without evidence of hernia.  Back No CVA tenderness Genito Urinary: surgically absent uterus and cervix. Vaginal cuff in tact and not bleeding. Rectal  deferred Extremities  No bilateral cyanosis, clubbing or edema.   45 minutes of direct face to face counseling time was spent with the patient. This included discussion about prognosis, therapy recommendations and postoperative side effects.  Donaciano Eva, MD  05/11/2017, 5:21 PM

## 2017-05-12 ENCOUNTER — Ambulatory Visit (HOSPITAL_BASED_OUTPATIENT_CLINIC_OR_DEPARTMENT_OTHER): Payer: BC Managed Care – PPO | Admitting: Hematology and Oncology

## 2017-05-12 ENCOUNTER — Telehealth: Payer: Self-pay | Admitting: *Deleted

## 2017-05-12 ENCOUNTER — Telehealth: Payer: Self-pay | Admitting: Hematology and Oncology

## 2017-05-12 ENCOUNTER — Encounter: Payer: Self-pay | Admitting: Hematology and Oncology

## 2017-05-12 VITALS — BP 150/55 | HR 97 | Temp 98.3°F | Resp 18 | Ht 62.0 in | Wt 220.0 lb

## 2017-05-12 DIAGNOSIS — T451X5A Adverse effect of antineoplastic and immunosuppressive drugs, initial encounter: Secondary | ICD-10-CM

## 2017-05-12 DIAGNOSIS — C481 Malignant neoplasm of specified parts of peritoneum: Secondary | ICD-10-CM

## 2017-05-12 DIAGNOSIS — Z7189 Other specified counseling: Secondary | ICD-10-CM

## 2017-05-12 DIAGNOSIS — G620 Drug-induced polyneuropathy: Secondary | ICD-10-CM | POA: Diagnosis not present

## 2017-05-12 NOTE — Progress Notes (Signed)
START ON PATHWAY REGIMEN - Ovarian     A cycle is every 28 days:     Liposomal doxorubicin      Bevacizumab   **Always confirm dose/schedule in your pharmacy ordering system**    Patient Characteristics: Recurrent or Progressive Disease, Second Line Therapy, < 6 Months AJCC T Category: T3 AJCC N Category: N0 AJCC M Category: M0 Therapeutic Status: Recurrent or Progressive Disease AJCC 8 Stage Grouping: Unknown Line of Therapy: Second Line BRCA Mutation Status: Absent Progression on neoadjuvant therapy? No Time since last treatment: < 6 Months Intent of Therapy: Non-Curative / Palliative Intent, Discussed with Patient

## 2017-05-12 NOTE — Telephone Encounter (Signed)
Scheduled appt per 7/10 los - Gave patient AVS and calender per los.  

## 2017-05-12 NOTE — Telephone Encounter (Signed)
Per staff message I have scheduled appt for today. Patient aware of the appt

## 2017-05-13 ENCOUNTER — Encounter: Payer: Self-pay | Admitting: *Deleted

## 2017-05-14 ENCOUNTER — Ambulatory Visit (HOSPITAL_COMMUNITY): Payer: BC Managed Care – PPO | Admitting: Oncology

## 2017-05-14 DIAGNOSIS — Z7189 Other specified counseling: Secondary | ICD-10-CM | POA: Insufficient documentation

## 2017-05-14 NOTE — Assessment & Plan Note (Addendum)
The patient is aware she has likely incurable disease and treatment is strictly palliative. We discussed importance of Advanced Directives and Living will.

## 2017-05-14 NOTE — Assessment & Plan Note (Addendum)
The patient had aggressive course of disease She has never been completely cancer free with detectable tumor markers despite chemotherapy Her recent significant rising tumor marker andCT imaging are suggestive of cancer relapse She is considered platinum refractory I will request pathologist to add additional testing for estrogen and progesterone receptors and HER-2/neu receptors to her prior surgical specimen I reviewed the guidelines with the patient and her husband We went through each option including discussion related to risk, benefits and side effects of Taxotere, gemcitabine based chemotherapy and others. Given her morbid obesity, peripheral neuropathy and other complications, I recommend we proceed with Doxil plus minus Avastin I will order urgent echocardiogram She needs to attend chemo education class I will see her before we start treatment for final chemotherapy consent I will omit Avastin with cycle 1 due to significant cardiovascular risk factors and to ensure that she does not develop GI complications

## 2017-05-14 NOTE — Assessment & Plan Note (Signed)
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 

## 2017-05-14 NOTE — Progress Notes (Signed)
[facility] progress notes  Patient Care Team: Eustaquio Maize, MD as PCP - General (Pediatrics)  CHIEF COMPLAINTS/PURPOSE OF VISIT:  Recurrent peritoneal carcinoma, platinum refractory  HISTORY OF PRESENTING ILLNESS:  Ellen Hunt 66 y.o. female was transferred to my care after her prior physician has left.  She is accompanied by her husband, Gershon Mussel The patient requested transfer of care to [facility] I reviewed the patient's records extensive and collaborated the history with the patient. 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 This may potentially be ovarian or a GI in etiology.      04/30/2016 Tumor Marker    CA 125- 7149.0 (H)      05/01/2016 Procedure    US paracentesis- Successful ultrasound-guided paracentesis yielding 1.8 liters of peritoneal fluid.      05/01/2016 Imaging    US pelvis- Both transabdominal and transvaginal sonography are significantly limited by large patient habitus and ascites. Neither uterus or ovaries were visualized on this exam.      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      05/08/2016 Imaging    CT chest- No evidence of metastatic disease in the chest. Peritoneal/omental disease with abdominal ascites in the upper abdomen, incompletely visualized.       05/13/2016 Procedure    Placement of single lumen port a cath via right internal jugular vein. The catheter tip lies at the cavoatrial junction. A power injectable port a cath was placed and is ready for immediate use.      05/15/2016 Procedure    US Paracentesis-  3400 ml yellow colored ascites removed      05/15/2016 - 09/18/2016 Chemotherapy    Carboplatin/Paclitaxel every 21 days x 7 cycles      07/01/2016 Miscellaneous    Genetic Counseling by [name]-  Genetic testing was normal, and did not reveal a deleterious mutation in these genes.       07/08/2016 Imaging    CT CAP- 1. Small volume ascites, significantly decreased. 2. Stable diffuse omental soft tissue caking and diffuse peritoneal thickening along the bilateral paracolic gutters and bilateral pelvic peritoneal reflections, consistent with peritoneal carcinomatosis. 3. Stable asymmetrically enlarged right ovary, which may represent the primary site of ovarian malignancy. 4. No evidence of metastatic disease in the chest. No new sites of metastatic disease in the abdomen or pelvis.      07/09/2016 Miscellaneous    Gyn Onc re-evaluation- modest response to therapy, 3 more cycles of chemotherapy recommended.        09/11/2016 Imaging    CT C/A/P No significant change omental soft tissue caking, consistent with metastatic disease. Mild ascites is decreased since previous study.  Increased calcification along peritoneal surface in pelvic cul-de-sac, consistent with treated peritoneal metastatic disease.  Stable 4.5cm homogeneous right pelvic mass, which favors a uterine fibroid although right ovarian neoplasm cannot definitely be excluded.  No new or progressive metastatic disease identified. No evidence of metastatic disease within the thorax.       10/14/2016 Procedure    Robotic-assisted laparoscopic total hysterectomy with bilateral salpingoophorectomy, ex lap omentectomy, radical tumor debulking by Dr. Denman George  10/17/2016 Pathology Results    Diagnosis 1. 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 - CERVIX AND ENDOMETRIUM FREE OF TUMOR. - SEE ONCOLOGY TABLE AND  COMMENT. 2. Soft tissue, biopsy, umbilical nodule - HIGH GRADE SEROUS CARCINOMA. 3. Omentum, resection for tumor - HIGH GRADE SEROUS CARCINOMA, 33 CM.      11/06/2016 - 12/25/2016 Chemotherapy    Carboplatin/Paclitaxel x 3 cycles       01/12/2017 Imaging    CT CAP- 1. Interval hysterectomy, bilateral salpingo-oophorectomy and omentectomy without evidence of tumor recurrence. 2. No evidence of metastatic disease. 3. 5 mm nonobstructing lower pole left renal calculus.      01/13/2017 Remission    No evidence of residual disease on CT imaging.      05/04/2017 Imaging    CT CAP- New small amount of ascites within the abdomen and pelvis since 01/12/2017 which could indicate disease progression but no new identifiable tumor and no significant change in omental and mild peritoneal thickening.  No evidence of metastatic disease within the chest.  Coronary artery disease.  Aortic Atherosclerosis (ICD10-I70.0).      05/04/2017 Tumor Marker    Patient's tumor was tested for the following markers: CA 125 Results of the tumor marker test revealed 1633      The patient has expressed dissatisfaction with her recent care received due to delay of monitoring of blood work Despite rising tumor markers, she is relatively asymptomatic except for some symptoms of gastric reflux She denies abdominal bloating, pain or changes in bowel habits The patient leads a sedentary lifestyle due to morbid obesity She has peripheral neuropathy secondary to chemotherapy along with insulin-dependent diabetes  MEDICAL HISTORY:  Past Medical History:  Diagnosis Date  . Diabetes mellitus without complication (HCC)    on insulin pump  . Dysrhythmia   . 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 Family history of breast cancer   . GERD (gastroesophageal reflux disease)   . History of blood transfusion   . History of bronchitis   . History of chemotherapy   . History of urinary tract  infection   . Hyperlipidemia   . Hypertension   . Low serum vitamin D   . Ovarian cancer ([place]) 05/09/2016  . Shingles     SURGICAL HISTORY: Past Surgical History:  Procedure Laterality Date  . ABDOMINAL HYSTERECTOMY  10/14/2016  . CESAREAN SECTION    . DEBULKING N/A 10/14/2016   Procedure: DEBULKING;  Surgeon: Everitt Amber, MD;  Location: [facility];  Service: Gynecology;  Laterality: N/A;  . LAPAROTOMY WITH STAGING N/A 10/14/2016   Procedure: LAPAROTOMY WITH OMENTECTOMY AND TUMOR DEBULGING;  Surgeon: Everitt Amber, MD;  Location: [facility];  Service: Gynecology;  Laterality: N/A;  . LUMBAR FUSION  08/21/15   L3-L4 Dr. Timmothy Euler  . OMENTECTOMY N/A 10/14/2016   Procedure: OMENTECTOMY;  Surgeon: Everitt Amber, MD;  Location: [facility];  Service: Gynecology;  Laterality: N/A;  . ROBOTIC ASSISTED TOTAL HYSTERECTOMY WITH BILATERAL SALPINGO OOPHERECTOMY Bilateral 10/14/2016   Procedure: XI ROBOTIC ASSISTED TOTAL LAPARSCOPIC  HYSTERECTOMY WITH BILATERAL SALPINGO OOPHORECTOMY;  Surgeon: Everitt Amber, MD;  Location: [facility];  Service: Gynecology;  Laterality: Bilateral;    SOCIAL HISTORY: Social History   Social History  . Marital status: Married    Spouse name: Gershon Mussel  . Number of children: 2  . Years of education: N/A   Occupational History  . retired  Social History Main Topics  . Smoking status: Never Smoker  . Smokeless tobacco: Never Used  . Alcohol use No  . Drug use: No  . Sexual activity: Not on file     Comment: married   Other Topics Concern  . Not on file   Social History Narrative  . No narrative on file    FAMILY HISTORY: Family History  Problem Relation Age of Onset  . Lung cancer Mother        smoker; dx in her 65s  . Leukemia Father   . Diabetes Paternal Grandmother   . Heart attack Paternal Grandmother   . Diabetes Paternal Grandfather   . Breast cancer Paternal Aunt        dx in her 11s-30s  . Leukemia Paternal Uncle   . Heart attack Maternal Grandfather   .  Breast cancer Cousin        maternal first cousin    ALLERGIES:  is allergic to avandia [rosiglitazone]; micronase [glyburide]; and actos [pioglitazone].  MEDICATIONS:  Current Outpatient Prescriptions  Medication Sig Dispense Refill  . atorvastatin (LIPITOR) 40 MG tablet Take 1 tablet (40 mg total) by mouth daily. 90 tablet 1  . benazepril (LOTENSIN) 10 MG tablet Take 1 tablet (10 mg total) by mouth daily. 90 tablet 1  . diazepam (VALIUM) 5 MG tablet Take 0.5 tablets (2.5 mg total) by mouth every 4 (four) hours as needed (spasms). 30 tablet 1  . furosemide (LASIX) 20 MG tablet Take 1 tablet (20 mg total) by mouth daily. 90 tablet 1  . glucagon (GLUCAGON EMERGENCY) 1 MG injection Inject as directed as needed for low blood glucose if unresponsive. 1 each 0  . glucosamine-chondroitin 500-400 MG tablet Take 1 tablet by mouth 2 (two) times daily.    Marland Kitchen HYDROcodone-acetaminophen (NORCO) 10-325 MG tablet Take 1 tablet by mouth every 4 (four) hours as needed. 90 tablet 0  . insulin aspart (NOVOLOG) 100 UNIT/ML injection USE IN INSULIN PUMP AS DIRECTED (MAX DAILY DOSE IS 110 UNITS PER DAY) 100 mL 0  . lidocaine-prilocaine (EMLA) cream Apply a quarter size amount to port site 1 hour prior to chemo. Do not rub in. Cover with plastic wrap. 30 g 3  . metFORMIN (GLUCOPHAGE) 1000 MG tablet TAKE 1 TABLET BY MOUTH TWICE DAILY WITH A MEAL 180 tablet 1  . Multiple Vitamin (MULTIVITAMIN WITH MINERALS) TABS Take 1 tablet by mouth daily.    . multivitamin-lutein (OCUVITE-LUTEIN) CAPS capsule Take 1 capsule by mouth daily.    . ONE TOUCH ULTRA TEST test strip USE TO CHECK BLOOD SUGAR UP TO 5 TIMES A DAY 450 each 2  . polyethylene glycol (MIRALAX / GLYCOLAX) packet Take 17 g by mouth daily.    Marland Kitchen senna (SENOKOT) 8.6 MG TABS tablet Take 1 tablet (8.6 mg total) by mouth at bedtime as needed for moderate constipation. 120 each 0  . vitamin E (VITAMIN E) 400 UNIT capsule Take 400 Units by mouth daily.     No current  facility-administered medications for this visit.     REVIEW OF SYSTEMS:   Constitutional: Denies fevers, chills or abnormal night sweats Eyes: Denies blurriness of vision, double vision or watery eyes Ears, nose, mouth, throat, and face: Denies mucositis or sore throat Respiratory: Denies cough, dyspnea or wheezes Cardiovascular: Denies palpitation, chest discomfort or lower extremity swelling Skin: Denies abnormal skin rashes Lymphatics: Denies new lymphadenopathy or easy bruising Neurological:Denies numbness, tingling or new weaknesses Behavioral/Psych: Mood is stable, no new  changes  All other systems were reviewed with the patient and are negative.  PHYSICAL EXAMINATION: ECOG PERFORMANCE STATUS: 2 - Symptomatic, <50% confined to bed  Vitals:   05/12/17 1347  BP: (!) 150/55  Pulse: 97  Resp: 18  Temp: 98.3 F (36.8 C)   Filed Weights   05/12/17 1347  Weight: 220 lb (99.8 kg)    GENERAL:alert, no distress and comfortable SKIN: skin color, texture, turgor are normal, no rashes or significant lesions EYES: normal, conjunctiva are pink and non-injected, sclera clear OROPHARYNX:no exudate, normal lips, buccal mucosa, and tongue  NECK: supple, thyroid normal size, non-tender, without nodularity LYMPH:  no palpable lymphadenopathy in the cervical, axillary or inguinal LUNGS: clear to auscultation and percussion with normal breathing effort HEART: regular rate & rhythm and no murmurs without lower extremity edema ABDOMEN:abdomen soft, non-tender and normal bowel sounds Musculoskeletal:no cyanosis of digits and no clubbing  PSYCH: alert & oriented x 3 with fluent speech NEURO: no focal motor/sensory deficits  LABORATORY DATA:  I have reviewed the data as listed Lab Results  Component Value Date   WBC 4.5 04/27/2017   HGB 10.5 (L) 04/27/2017   HCT 31.8 (L) 04/27/2017   MCV 98.5 04/27/2017   PLT 185 04/27/2017    Recent Labs  01/14/17 1115 02/25/17 1456  04/27/17 1003  NA 137 137 137  K 4.2 4.4 4.3  CL 104 101 105  CO2 '24 23 25  ' GLUCOSE 142* 193* 219*  BUN 18 36* 26*  CREATININE 0.73 0.99 0.85  CALCIUM 9.3 9.6 8.8*  GFRNONAA >60 59* >60  GFRAA >60 >60 >60  PROT 6.6 6.9 6.3*  ALBUMIN 3.8 4.0 3.6  AST '21 23 21  ' ALT '21 22 19  ' ALKPHOS 86 98 93  BILITOT 0.3 0.4 0.5    RADIOGRAPHIC STUDIES: I have reviewed imaging study with the patient and her husband I have personally reviewed the radiological images as listed and agreed with the findings in the report. Ct Chest W Contrast  Result Date: 05/04/2017 CLINICAL DATA:  66 year old female for restaging of ovarian cancer. Chemotherapy completed 01/2017. Rising CA 125. EXAM: CT CHEST, ABDOMEN, AND PELVIS WITH CONTRAST TECHNIQUE: Multidetector CT imaging of the chest, abdomen and pelvis was performed following the standard protocol during bolus administration of intravenous contrast. CONTRAST:  149m ISOVUE-300 IOPAMIDOL (ISOVUE-300) INJECTION 61% COMPARISON:  01/12/2017 and prior CTs FINDINGS: CT CHEST FINDINGS Cardiovascular: Mild coronary artery and thoracic aortic atherosclerotic calcifications again noted. There is no evidence of thoracic aortic aneurysm. Normal heart size. No pericardial effusion. A right Port-A-Cath is present with tip in the lower SVC. Mediastinum/Nodes: No enlarged mediastinal, hilar, or axillary lymph nodes. Thyroid gland, trachea, and esophagus demonstrate no significant findings. Lungs/Pleura: Lungs are clear. No pleural effusion or pneumothorax. Musculoskeletal: No acute or suspicious bony abnormalities. CT ABDOMEN PELVIS FINDINGS Hepatobiliary: The liver and gallbladder are unremarkable. No biliary dilatation. Pancreas: Unremarkable Spleen: Unremarkable Adrenals/Urinary Tract: The kidneys, adrenal glands and bladder are unremarkable except for unchanged nonobstructing left lower pole renal calculus. Stomach/Bowel: No bowel obstruction or definite bowel wall thickening noted.  Vascular/Lymphatic: Aortic atherosclerosis. No enlarged abdominal or pelvic lymph nodes. Reproductive: Patient is status post hysterectomy and bilateral oophorectomy. Other: A small amount of ascites adjacent to the liver and within the pelvis is new. Mild omental thickening/stranding and mild peritoneal thickening is unchanged. No definite new omental or peritoneal abnormality is noted. There is no evidence of pneumoperitoneum. Musculoskeletal: No acute or suspicious abnormalities. Degenerative and postsurgical changes in  the lumbar spine again noted. IMPRESSION: New small amount of ascites within the abdomen and pelvis since 01/12/2017 which could indicate disease progression but no new identifiable tumor and no significant change in omental and mild peritoneal thickening. No evidence of metastatic disease within the chest. Coronary artery disease. Aortic Atherosclerosis (ICD10-I70.0). Electronically Signed   By: Margarette Canada M.D.   On: 05/04/2017 14:56   Ct Abdomen Pelvis W Contrast  Result Date: 05/04/2017 CLINICAL DATA:  66 year old female for restaging of ovarian cancer. Chemotherapy completed 01/2017. Rising CA 125. EXAM: CT CHEST, ABDOMEN, AND PELVIS WITH CONTRAST TECHNIQUE: Multidetector CT imaging of the chest, abdomen and pelvis was performed following the standard protocol during bolus administration of intravenous contrast. CONTRAST:  167m ISOVUE-300 IOPAMIDOL (ISOVUE-300) INJECTION 61% COMPARISON:  01/12/2017 and prior CTs FINDINGS: CT CHEST FINDINGS Cardiovascular: Mild coronary artery and thoracic aortic atherosclerotic calcifications again noted. There is no evidence of thoracic aortic aneurysm. Normal heart size. No pericardial effusion. A right Port-A-Cath is present with tip in the lower SVC. Mediastinum/Nodes: No enlarged mediastinal, hilar, or axillary lymph nodes. Thyroid gland, trachea, and esophagus demonstrate no significant findings. Lungs/Pleura: Lungs are clear. No pleural effusion or  pneumothorax. Musculoskeletal: No acute or suspicious bony abnormalities. CT ABDOMEN PELVIS FINDINGS Hepatobiliary: The liver and gallbladder are unremarkable. No biliary dilatation. Pancreas: Unremarkable Spleen: Unremarkable Adrenals/Urinary Tract: The kidneys, adrenal glands and bladder are unremarkable except for unchanged nonobstructing left lower pole renal calculus. Stomach/Bowel: No bowel obstruction or definite bowel wall thickening noted. Vascular/Lymphatic: Aortic atherosclerosis. No enlarged abdominal or pelvic lymph nodes. Reproductive: Patient is status post hysterectomy and bilateral oophorectomy. Other: A small amount of ascites adjacent to the liver and within the pelvis is new. Mild omental thickening/stranding and mild peritoneal thickening is unchanged. No definite new omental or peritoneal abnormality is noted. There is no evidence of pneumoperitoneum. Musculoskeletal: No acute or suspicious abnormalities. Degenerative and postsurgical changes in the lumbar spine again noted. IMPRESSION: New small amount of ascites within the abdomen and pelvis since 01/12/2017 which could indicate disease progression but no new identifiable tumor and no significant change in omental and mild peritoneal thickening. No evidence of metastatic disease within the chest. Coronary artery disease. Aortic Atherosclerosis (ICD10-I70.0). Electronically Signed   By: JMargarette CanadaM.D.   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[place] The patient had aggressive course of disease She has never been completely cancer free with detectable tumor markers despite chemotherapy Her recent significant rising tumor marker andCT imaging are suggestive of cancer relapse She is considered platinum refractory I will request pathologist to add additional testing for estrogen and progesterone receptors and HER-2/neu receptors to her prior surgical specimen I reviewed the guidelines with  the patient and her husband We went through each option including discussion related to risk, benefits and side effects of Taxotere, gemcitabine based chemotherapy and others. Given her morbid obesity, peripheral neuropathy and other complications, I recommend we proceed with Doxil plus minus Avastin I will order urgent echocardiogram She needs to attend chemo education class I will see her before we start treatment for final chemotherapy consent I will omit Avastin with cycle 1 due to significant cardiovascular risk factors and to ensure that she does not develop GI complications  Chemotherapy-induced neuropathy ([place] She had history of peripheral neuropathy due to chemotherapy and diabetes Continue to monitor closely  Goals of care, counseling/discussion The patient is aware she has likely incurable disease and treatment is strictly  palliative. We discussed importance of Advanced Directives and Living will.    Orders Placed This Encounter  Procedures  . CBC with Differential    Standing Status:   Standing    Number of Occurrences:   20    Standing Expiration Date:   05/13/2018  . Comprehensive metabolic panel    Standing Status:   Standing    Number of Occurrences:   20    Standing Expiration Date:   05/13/2018  . ECHOCARDIOGRAM LIMITED    Standing Status:   Future    Standing Expiration Date:   08/12/2018    Order Specific Question:   Where should this test be performed    Answer:   [place]    Order Specific Question:   Perflutren DEFINITY (image enhancing agent) should be administered unless hypersensitivity or allergy exist    Answer:   Administer Perflutren    Order Specific Question:   Expected Date:    Answer:   1 week    All questions were answered. The patient knows to call the clinic with any problems, questions or concerns. I spent 55 minutes counseling the patient face to face. The total time spent in the appointment was 80 minutes and more than 50% was on  counseling.     Heath Lark, MD 05/14/2017 7:48 PM

## 2017-05-15 ENCOUNTER — Telehealth: Payer: Self-pay | Admitting: *Deleted

## 2017-05-15 NOTE — Telephone Encounter (Signed)
Called pathology to add testing noted below

## 2017-05-15 NOTE — Telephone Encounter (Signed)
-----   Message from Heath Lark, MD sent at 05/14/2017  7:40 PM EDT ----- Regarding: path Accession: [id number]  Can you call pathology to add ER/PR and Her 2/neu testing on her sample?

## 2017-05-18 ENCOUNTER — Other Ambulatory Visit: Payer: BC Managed Care – PPO

## 2017-05-18 ENCOUNTER — Ambulatory Visit (HOSPITAL_COMMUNITY)
Admission: RE | Admit: 2017-05-18 | Discharge: 2017-05-18 | Disposition: A | Discharge status: Home / Self Care | Payer: BC Managed Care – PPO | Source: Ambulatory Visit | Attending: Hematology and Oncology | Admitting: Hematology and Oncology

## 2017-05-18 ENCOUNTER — Other Ambulatory Visit: Payer: Self-pay | Admitting: Hematology and Oncology

## 2017-05-18 DIAGNOSIS — Z853 Personal history of malignant neoplasm of breast: Secondary | ICD-10-CM | POA: Diagnosis not present

## 2017-05-18 DIAGNOSIS — C481 Malignant neoplasm of specified parts of peritoneum: Secondary | ICD-10-CM | POA: Diagnosis present

## 2017-05-18 DIAGNOSIS — E785 Hyperlipidemia, unspecified: Secondary | ICD-10-CM | POA: Diagnosis not present

## 2017-05-18 DIAGNOSIS — E119 Type 2 diabetes mellitus without complications: Secondary | ICD-10-CM | POA: Diagnosis not present

## 2017-05-18 NOTE — Progress Notes (Signed)
Echocardiogram 2D Echocardiogram has been performed.  Ellen Hunt 05/18/2017, 9:46 AM

## 2017-05-18 NOTE — Progress Notes (Signed)
  Echocardiogram 2D Echocardiogram has been performed.  Ellen Hunt 05/18/2017, 9:44 AM

## 2017-05-22 ENCOUNTER — Encounter: Payer: Self-pay | Admitting: Hematology and Oncology

## 2017-05-25 ENCOUNTER — Ambulatory Visit: Payer: BC Managed Care – PPO

## 2017-05-25 ENCOUNTER — Telehealth: Payer: Self-pay | Admitting: Hematology and Oncology

## 2017-05-25 ENCOUNTER — Ambulatory Visit (HOSPITAL_BASED_OUTPATIENT_CLINIC_OR_DEPARTMENT_OTHER): Payer: BC Managed Care – PPO

## 2017-05-25 ENCOUNTER — Encounter: Payer: Self-pay | Admitting: Hematology and Oncology

## 2017-05-25 ENCOUNTER — Other Ambulatory Visit: Payer: Self-pay | Admitting: Hematology and Oncology

## 2017-05-25 ENCOUNTER — Other Ambulatory Visit (HOSPITAL_BASED_OUTPATIENT_CLINIC_OR_DEPARTMENT_OTHER): Payer: BC Managed Care – PPO

## 2017-05-25 ENCOUNTER — Ambulatory Visit (HOSPITAL_BASED_OUTPATIENT_CLINIC_OR_DEPARTMENT_OTHER): Payer: BC Managed Care – PPO | Admitting: Hematology and Oncology

## 2017-05-25 VITALS — BP 141/54 | HR 94 | Temp 98.7°F | Resp 17 | Ht 62.0 in | Wt 224.6 lb

## 2017-05-25 DIAGNOSIS — C481 Malignant neoplasm of specified parts of peritoneum: Secondary | ICD-10-CM

## 2017-05-25 DIAGNOSIS — E538 Deficiency of other specified B group vitamins: Secondary | ICD-10-CM

## 2017-05-25 DIAGNOSIS — Z7189 Other specified counseling: Secondary | ICD-10-CM

## 2017-05-25 DIAGNOSIS — C569 Malignant neoplasm of unspecified ovary: Secondary | ICD-10-CM

## 2017-05-25 DIAGNOSIS — Z5111 Encounter for antineoplastic chemotherapy: Secondary | ICD-10-CM

## 2017-05-25 LAB — CBC WITH DIFFERENTIAL/PLATELET
BASO%: 0.6 % (ref 0.0–2.0)
BASOS ABS: 0 10*3/uL (ref 0.0–0.1)
EOS%: 5.5 % (ref 0.0–7.0)
Eosinophils Absolute: 0.3 10*3/uL (ref 0.0–0.5)
HCT: 34.6 % — ABNORMAL LOW (ref 34.8–46.6)
HGB: 11.6 g/dL (ref 11.6–15.9)
LYMPH%: 6.1 % — AB (ref 14.0–49.7)
MCH: 31.7 pg (ref 25.1–34.0)
MCHC: 33.4 g/dL (ref 31.5–36.0)
MCV: 94.9 fL (ref 79.5–101.0)
MONO#: 0.5 10*3/uL (ref 0.1–0.9)
MONO%: 8 % (ref 0.0–14.0)
NEUT#: 4.7 10*3/uL (ref 1.5–6.5)
NEUT%: 79.8 % — AB (ref 38.4–76.8)
Platelets: 202 10*3/uL (ref 145–400)
RBC: 3.64 10*6/uL — ABNORMAL LOW (ref 3.70–5.45)
RDW: 13.8 % (ref 11.2–14.5)
WBC: 5.8 10*3/uL (ref 3.9–10.3)
lymph#: 0.4 10*3/uL — ABNORMAL LOW (ref 0.9–3.3)

## 2017-05-25 LAB — COMPREHENSIVE METABOLIC PANEL
ALT: 15 U/L (ref 0–55)
AST: 14 U/L (ref 5–34)
Albumin: 3.7 g/dL (ref 3.5–5.0)
Alkaline Phosphatase: 83 U/L (ref 40–150)
Anion Gap: 13 mEq/L — ABNORMAL HIGH (ref 3–11)
BUN: 18.4 mg/dL (ref 7.0–26.0)
CALCIUM: 9.7 mg/dL (ref 8.4–10.4)
CHLORIDE: 105 meq/L (ref 98–109)
CO2: 23 mEq/L (ref 22–29)
Creatinine: 1.1 mg/dL (ref 0.6–1.1)
EGFR: 55 mL/min/{1.73_m2} — ABNORMAL LOW (ref >90)
Glucose: 130 mg/dl (ref 70–140)
POTASSIUM: 4.4 meq/L (ref 3.5–5.1)
SODIUM: 141 meq/L (ref 136–145)
Total Bilirubin: 0.4 mg/dL (ref 0.20–1.20)
Total Protein: 6.9 g/dL (ref 6.4–8.3)

## 2017-05-25 MED ORDER — DOXORUBICIN HCL LIPOSOMAL CHEMO INJECTION 2 MG/ML
38.0000 mg/m2 | Freq: Once | INTRAVENOUS | Status: AC
  Administered 2017-05-25: 80 mg via INTRAVENOUS
  Filled 2017-05-25: qty 40

## 2017-05-25 MED ORDER — SODIUM CHLORIDE 0.9% FLUSH
10.0000 mL | INTRAVENOUS | Status: DC | PRN
  Administered 2017-05-25: 10 mL
  Filled 2017-05-25: qty 10

## 2017-05-25 MED ORDER — DEXTROSE 5 % IV SOLN
Freq: Once | INTRAVENOUS | Status: AC
  Administered 2017-05-25: 14:00:00 via INTRAVENOUS

## 2017-05-25 MED ORDER — SODIUM CHLORIDE 0.9 % IV SOLN: 10.0000 mg | Freq: Once | INTRAVENOUS | Status: DC

## 2017-05-25 MED ORDER — HEPARIN SOD (PORK) LOCK FLUSH 100 UNIT/ML IV SOLN
500.0000 [IU] | Freq: Once | INTRAVENOUS | Status: AC | PRN
  Administered 2017-05-25: 500 [IU]
  Filled 2017-05-25: qty 5

## 2017-05-25 MED ORDER — DEXAMETHASONE SODIUM PHOSPHATE 10 MG/ML IJ SOLN
INTRAMUSCULAR | Status: AC
  Filled 2017-05-25: qty 1

## 2017-05-25 MED ORDER — DIAZEPAM 5 MG PO TABS: 2.5000 mg | ORAL_TABLET | ORAL | 1 refills | Status: DC | PRN

## 2017-05-25 MED ORDER — SODIUM CHLORIDE 0.9% FLUSH
10.0000 mL | INTRAVENOUS | Status: DC | PRN
  Administered 2017-05-25: 10 mL via INTRAVENOUS
  Filled 2017-05-25: qty 10

## 2017-05-25 MED ORDER — DEXAMETHASONE SODIUM PHOSPHATE 10 MG/ML IJ SOLN
10.0000 mg | Freq: Once | INTRAMUSCULAR | Status: AC
  Administered 2017-05-25: 10 mg via INTRAVENOUS

## 2017-05-25 MED ORDER — HYDROCODONE-ACETAMINOPHEN 10-325 MG PO TABS: 1.0000 | ORAL_TABLET | ORAL | 0 refills | Status: DC | PRN

## 2017-05-25 MED FILL — diazePAM 5 MG TABS: 5 | 10 days supply | Qty: 60 | Fill #0

## 2017-05-25 MED FILL — HYDROCODON-APAP 10-325: 10-325 | 15 days supply | Qty: 90 | Fill #0

## 2017-05-25 NOTE — Patient Instructions (Addendum)
[facility] Discharge Instructions for Patients Receiving Chemotherapy  Today you received the following chemotherapy agents:  Doxil.  To help prevent nausea and vomiting after your treatment, we encourage you to take your nausea medication as directed.   If you develop nausea and vomiting that is not controlled by your nausea medication, call the clinic.   BELOW ARE SYMPTOMS THAT SHOULD BE REPORTED IMMEDIATELY:  *FEVER GREATER THAN 100.5 F  *CHILLS WITH OR WITHOUT FEVER  NAUSEA AND VOMITING THAT IS NOT CONTROLLED WITH YOUR NAUSEA MEDICATION  *UNUSUAL SHORTNESS OF BREATH  *UNUSUAL BRUISING OR BLEEDING  TENDERNESS IN MOUTH AND THROAT WITH OR WITHOUT PRESENCE OF ULCERS  *URINARY PROBLEMS  *BOWEL PROBLEMS  UNUSUAL RASH Items with * indicate a potential emergency and should be followed up as soon as possible.  Feel free to call the clinic you have any questions or concerns. The clinic phone number is [phone].  Please show the [facility] at check-in to the Emergency Department and triage nurse.  Doxorubicin Liposomal injection What is this medicine? LIPOSOMAL DOXORUBICIN (LIP oh som al dox oh ROO bi sin) is a chemotherapy drug. This medicine is used to treat many kinds of cancer like Kaposi's sarcoma, multiple myeloma, and ovarian cancer. This medicine may be used for other purposes; ask your health care provider or pharmacist if you have questions. COMMON BRAND NAME(S): Doxil, Lipodox What should I tell my health care provider before I take this medicine? They need to know if you have any of these conditions: -blood disorders -heart disease -infection (especially a virus infection such as chickenpox, cold sores, or herpes) -liver disease -recent or ongoing radiation therapy -an unusual or allergic reaction to doxorubicin, other chemotherapy agents, soybeans, other medicines, foods, dyes, or preservatives -pregnant or trying to get  pregnant -breast-feeding How should I use this medicine? This drug is given as an infusion into a vein. It is administered in a hospital or clinic by a specially trained health care professional. If you have pain, swelling, burning or any unusual feeling around the site of your injection, tell your health care professional right away. Talk to your pediatrician regarding the use of this medicine in children. Special care may be needed. Overdosage: If you think you have taken too much of this medicine contact a poison control center or emergency room at once. NOTE: This medicine is only for you. Do not share this medicine with others. What if I miss a dose? It is important not to miss your dose. Call your doctor or health care professional if you are unable to keep an appointment. What may interact with this medicine? Do not take this medicine with any of the following medications: -zidovudine This medicine may also interact with the following medications: -medicines to increase blood counts like filgrastim, pegfilgrastim, sargramostim -vaccines Talk to your doctor or health care professional before taking any of these medicines: -acetaminophen -aspirin -ibuprofen -ketoprofen -naproxen This list may not describe all possible interactions. Give your health care provider a list of all the medicines, herbs, non-prescription drugs, or dietary supplements you use. Also tell them if you smoke, drink alcohol, or use illegal drugs. Some items may interact with your medicine. What should I watch for while using this medicine? Your condition will be monitored carefully while you are receiving this medicine. You will need important blood work done while you are taking this medicine. This drug may make you feel generally unwell. This is not uncommon, as chemotherapy can  affect healthy cells as well as cancer cells. Report any side effects. Continue your course of treatment even though you feel ill unless  your doctor tells you to stop. Your urine may turn orange-red for a few days after your dose. This is not blood. If your urine is dark or brown, call your doctor. In some cases, you may be given additional medicines to help with side effects. Follow all directions for their use. Call your doctor or health care professional for advice if you get a fever (100.5 degrees F or higher), chills or sore throat, or other symptoms of a cold or flu. Do not treat yourself. This drug decreases your body's ability to fight infections. Try to avoid being around people who are sick. This medicine may increase your risk to bruise or bleed. Call your doctor or health care professional if you notice any unusual bleeding. Be careful brushing and flossing your teeth or using a toothpick because you may get an infection or bleed more easily. If you have any dental work done, tell your dentist you are receiving this medicine. Avoid taking products that contain aspirin, acetaminophen, ibuprofen, naproxen, or ketoprofen unless instructed by your doctor. These medicines may hide a fever. Men and women of childbearing age should use effective birth control methods while using taking this medicine. Do not become pregnant while taking this medicine. There is a potential for serious side effects to an unborn child. Talk to your health care professional or pharmacist for more information. Do not breast-feed an infant while taking this medicine. Talk to your doctor about your risk of cancer. You may be more at risk for certain types of cancers if you take this medicine. What side effects may I notice from receiving this medicine? Side effects that you should report to your doctor or health care professional as soon as possible: -allergic reactions like skin rash, itching or hives, swelling of the face, lips, or tongue -low blood counts - this medicine may decrease the number of white blood cells, red blood cells and platelets. You may  be at increased risk for infections and bleeding. -signs of hand-foot syndrome - tingling or burning, redness, flaking, swelling, small blisters, or small sores on the palms of your hands or the soles of your feet -signs of infection - fever or chills, cough, sore throat, pain or difficulty passing urine -signs of decreased platelets or bleeding - bruising, pinpoint red spots on the skin, black, tarry stools, blood in the urine -signs of decreased red blood cells - unusually weak or tired, fainting spells, lightheadedness -back pain, chills, facial flushing, fever, headache, tightness in the chest or throat during the infusion -breathing problems -chest pain -fast, irregular heartbeat -mouth pain, redness, sores -pain, swelling, redness at site where injected -pain, tingling, numbness in the hands or feet -swelling of ankles, feet, or hands -vomiting Side effects that usually do not require medical attention (report to your doctor or health care professional if they continue or are bothersome): -diarrhea -hair loss -loss of appetite -nail discoloration or damage -nausea -red or watery eyes -red colored urine -stomach upset This list may not describe all possible side effects. Call your doctor for medical advice about side effects. You may report side effects to FDA at 1-800-FDA-1088. Where should I keep my medicine? This drug is given in a hospital or clinic and will not be stored at home. NOTE: This sheet is a summary. It may not cover all possible information. If you have questions  about this medicine, talk to your doctor, pharmacist, or health care provider.  2018 [employer] (2012-07-09 10:12:56)    

## 2017-05-25 NOTE — Progress Notes (Signed)
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 This may potentially be ovarian or a GI in etiology.      04/30/2016 Tumor Marker    CA 125- 7149.0 (H)      05/01/2016 Procedure    US paracentesis- Successful ultrasound-guided paracentesis yielding 1.8 liters of peritoneal fluid.      05/01/2016 Imaging    US pelvis- Both transabdominal and transvaginal sonography are significantly limited by large patient habitus and ascites. Neither uterus or ovaries were visualized on this exam.      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      05/08/2016 Imaging    CT chest- No evidence of metastatic disease in the chest. Peritoneal/omental disease with abdominal ascites in the upper abdomen, incompletely visualized.       05/13/2016 Procedure    Placement of single lumen port a cath via right internal jugular vein. The catheter tip lies at the cavoatrial junction. A power injectable port a cath was placed and is ready for immediate use.      05/15/2016 Procedure    US Paracentesis- 3400 ml yellow colored ascites removed      05/15/2016 - 09/18/2016 Chemotherapy    Carboplatin/Paclitaxel every 21 days x 7 cycles      07/01/2016 Miscellaneous    Genetic Counseling by [name]-  Genetic testing was normal, and did not reveal a deleterious mutation in these genes.       07/08/2016 Imaging    CT CAP- 1. Small volume ascites, significantly decreased. 2. Stable diffuse omental soft  tissue caking and diffuse peritoneal thickening along the bilateral paracolic gutters and bilateral pelvic peritoneal reflections, consistent with peritoneal carcinomatosis. 3. Stable asymmetrically enlarged right ovary, which may represent the primary site of ovarian malignancy. 4. No evidence of metastatic disease in the chest. No new sites of metastatic disease in the abdomen or pelvis.      07/09/2016 Miscellaneous    Gyn Onc re-evaluation- modest response to therapy, 3 more cycles of chemotherapy recommended.        09/11/2016 Imaging    CT C/A/P No significant change omental soft tissue caking, consistent with metastatic disease. Mild ascites is decreased since previous study.  Increased calcification along peritoneal surface in pelvic cul-de-sac, consistent with treated peritoneal metastatic disease.  Stable 4.5cm homogeneous right pelvic mass, which favors a uterine fibroid although right ovarian neoplasm cannot definitely be excluded.  No new or progressive metastatic disease identified. No evidence of metastatic disease within the thorax.       10/14/2016 Procedure    Robotic-assisted laparoscopic total hysterectomy with bilateral salpingoophorectomy, ex lap omentectomy, radical tumor debulking by Dr. Denman George      10/17/2016 Pathology Results    Diagnosis 1. 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 - CERVIX AND ENDOMETRIUM FREE OF TUMOR. - SEE ONCOLOGY TABLE AND COMMENT. 2. Soft tissue, biopsy, umbilical nodule - HIGH GRADE SEROUS CARCINOMA. 3. Omentum, resection for tumor - HIGH GRADE  SEROUS CARCINOMA, 33 CM.      11/06/2016 - 12/25/2016 Chemotherapy    Carboplatin/Paclitaxel x 3 cycles       01/12/2017 Imaging    CT CAP- 1. Interval hysterectomy, bilateral salpingo-oophorectomy and omentectomy without evidence of tumor recurrence. 2. No evidence of  metastatic disease. 3. 5 mm nonobstructing lower pole left renal calculus.      01/13/2017 Remission    No evidence of residual disease on CT imaging.      05/04/2017 Imaging    CT CAP- New small amount of ascites within the abdomen and pelvis since 01/12/2017 which could indicate disease progression but no new identifiable tumor and no significant change in omental and mild peritoneal thickening.  No evidence of metastatic disease within the chest.  Coronary artery disease.  Aortic Atherosclerosis (ICD10-I70.0).      05/04/2017 Tumor Marker    Patient's tumor was tested for the following markers: CA 125 Results of the tumor marker test revealed 1633      05/18/2017 Imaging    ECHO; EF 60% -  65       INTERVAL HISTORY: Please see below for problem oriented charting. She returns for chemotherapy consent. She denies new symptoms since the last time I saw her She has noted some minimum weight gain She had intermittent constipation, resolved with laxatives She denies cancer pain  REVIEW OF SYSTEMS:   Constitutional: Denies fevers, chills or abnormal weight loss Eyes: Denies blurriness of vision Ears, nose, mouth, throat, and face: Denies mucositis or sore throat Respiratory: Denies cough, dyspnea or wheezes Cardiovascular: Denies palpitation, chest discomfort or lower extremity swelling Gastrointestinal:  Denies nausea, heartburn or change in bowel habits Skin: Denies abnormal skin rashes Lymphatics: Denies new lymphadenopathy or easy bruising Neurological:Denies numbness, tingling or new weaknesses Behavioral/Psych: Mood is stable, no new changes  All other systems were reviewed with the patient and are negative.  I have reviewed the past medical history, past surgical history, social history and family history with the patient and they are unchanged from previous note.  ALLERGIES:  is allergic to avandia [rosiglitazone]; micronase [glyburide]; and actos  [pioglitazone].  MEDICATIONS:  Current Outpatient Prescriptions  Medication Sig Dispense Refill  . atorvastatin (LIPITOR) 40 MG tablet Take 1 tablet (40 mg total) by mouth daily. 90 tablet 1  . benazepril (LOTENSIN) 10 MG tablet Take 1 tablet (10 mg total) by mouth daily. 90 tablet 1  . diazepam (VALIUM) 5 MG tablet Take 0.5 tablets (2.5 mg total) by mouth every 4 (four) hours as needed (spasms). 60 tablet 1  . furosemide (LASIX) 20 MG tablet Take 1 tablet (20 mg total) by mouth daily. 90 tablet 1  . glucosamine-chondroitin 500-400 MG tablet Take 1 tablet by mouth 2 (two) times daily.    Marland Kitchen HYDROcodone-acetaminophen (NORCO) 10-325 MG tablet Take 1 tablet by mouth every 4 (four) hours as needed. 90 tablet 0  . insulin aspart (NOVOLOG) 100 UNIT/ML injection USE IN INSULIN PUMP AS DIRECTED (MAX DAILY DOSE IS 110 UNITS PER DAY) 100 mL 0  . lidocaine-prilocaine (EMLA) cream Apply a quarter size amount to port site 1 hour prior to chemo. Do not rub in. Cover with plastic wrap. 30 g 3  . metFORMIN (GLUCOPHAGE) 1000 MG tablet TAKE 1 TABLET BY MOUTH TWICE DAILY WITH A MEAL 180 tablet 1  . Multiple Vitamin (MULTIVITAMIN WITH MINERALS) TABS Take 1 tablet by mouth daily.    . multivitamin-lutein (OCUVITE-LUTEIN) CAPS capsule Take 1 capsule by mouth daily.    Marland Kitchen  omeprazole (PRILOSEC) 40 MG capsule     . ONE TOUCH ULTRA TEST test strip USE TO CHECK BLOOD SUGAR UP TO 5 TIMES A DAY 450 each 2  . polyethylene glycol (MIRALAX / GLYCOLAX) packet Take 17 g by mouth daily.    Marland Kitchen senna (SENOKOT) 8.6 MG TABS tablet Take 1 tablet (8.6 mg total) by mouth at bedtime as needed for moderate constipation. 120 each 0  . vitamin E (VITAMIN E) 400 UNIT capsule Take 400 Units by mouth daily.    Marland Kitchen glucagon (GLUCAGON EMERGENCY) 1 MG injection Inject as directed as needed for low blood glucose if unresponsive. (Patient not taking: Reported on 05/25/2017) 1 each 0  . ondansetron (ZOFRAN) 8 MG tablet Take by mouth every 8 (eight) hours  as needed for nausea or vomiting.     No current facility-administered medications for this visit.    Facility-Administered Medications Ordered in Other Visits  Medication Dose Route Frequency Provider Last Rate Last Dose  . DOXOrubicin HCL LIPOSOMAL (DOXIL) 80 mg in dextrose 5 % 250 mL chemo infusion  38 mg/m2 (Treatment Plan Recorded) Intravenous Once Alvy Bimler, Asriel Westrup, MD 217.5 mL/hr at 05/25/17 1447 80 mg at 05/25/17 1447  . heparin lock flush 100 unit/mL  500 Units Intracatheter Once PRN Alvy Bimler, Sharyl Panchal, MD      . sodium chloride flush (NS) 0.9 % injection 10 mL  10 mL Intracatheter PRN Alvy Bimler, Xzavior Reinig, MD        PHYSICAL EXAMINATION: ECOG PERFORMANCE STATUS: 1 - Symptomatic but completely ambulatory  Vitals:   05/25/17 1157  BP: (!) 141/54  Pulse: 94  Resp: 17  Temp: 98.7 F (37.1 C)   Filed Weights   05/25/17 1157  Weight: 224 lb 9.6 oz (101.9 kg)    GENERAL:alert, no distress and comfortable.  She is morbidly obese SKIN: skin color, texture, turgor are normal, no rashes or significant lesions EYES: normal, Conjunctiva are pink and non-injected, sclera clear OROPHARYNX:no exudate, no erythema and lips, buccal mucosa, and tongue normal  NECK: supple, thyroid normal size, non-tender, without nodularity LYMPH:  no palpable lymphadenopathy in the cervical, axillary or inguinal LUNGS: clear to auscultation and percussion with normal breathing effort HEART: regular rate & rhythm and no murmurs and no lower extremity edema ABDOMEN:abdomen soft, non-tender and normal bowel sounds Musculoskeletal:no cyanosis of digits and no clubbing  NEURO: alert & oriented x 3 with fluent speech, no focal motor/sensory deficits  LABORATORY DATA:  I have reviewed the data as listed    Component Value Date/Time   NA 141 05/25/2017 1136   K 4.4 05/25/2017 1136   CL 105 04/27/2017 1003   CO2 23 05/25/2017 1136   GLUCOSE 130 05/25/2017 1136   BUN 18.4 05/25/2017 1136   CREATININE 1.1 05/25/2017 1136    CALCIUM 9.7 05/25/2017 1136   PROT 6.9 05/25/2017 1136   ALBUMIN 3.7 05/25/2017 1136   AST 14 05/25/2017 1136   ALT 15 05/25/2017 1136   ALKPHOS 83 05/25/2017 1136   BILITOT 0.40 05/25/2017 1136   GFRNONAA >60 04/27/2017 1003   GFRAA >60 04/27/2017 1003    No results found for: SPEP, UPEP  Lab Results  Component Value Date   WBC 5.8 05/25/2017   NEUTROABS 4.7 05/25/2017   HGB 11.6 05/25/2017   HCT 34.6 (L) 05/25/2017   MCV 94.9 05/25/2017   PLT 202 05/25/2017      Chemistry      Component Value Date/Time   NA 141 05/25/2017 1136   K  4.4 05/25/2017 1136   CL 105 04/27/2017 1003   CO2 23 05/25/2017 1136   BUN 18.4 05/25/2017 1136   CREATININE 1.1 05/25/2017 1136      Component Value Date/Time   CALCIUM 9.7 05/25/2017 1136   ALKPHOS 83 05/25/2017 1136   AST 14 05/25/2017 1136   ALT 15 05/25/2017 1136   BILITOT 0.40 05/25/2017 1136       RADIOGRAPHIC STUDIES: I have personally reviewed the radiological images as listed and agreed with the findings in the report. Ct Chest W Contrast  Result Date: 05/04/2017 CLINICAL DATA:  66 year old female for restaging of ovarian cancer. Chemotherapy completed 01/2017. Rising CA 125. EXAM: CT CHEST, ABDOMEN, AND PELVIS WITH CONTRAST TECHNIQUE: Multidetector CT imaging of the chest, abdomen and pelvis was performed following the standard protocol during bolus administration of intravenous contrast. CONTRAST:  148m ISOVUE-300 IOPAMIDOL (ISOVUE-300) INJECTION 61% COMPARISON:  01/12/2017 and prior CTs FINDINGS: CT CHEST FINDINGS Cardiovascular: Mild coronary artery and thoracic aortic atherosclerotic calcifications again noted. There is no evidence of thoracic aortic aneurysm. Normal heart size. No pericardial effusion. A right Port-A-Cath is present with tip in the lower SVC. Mediastinum/Nodes: No enlarged mediastinal, hilar, or axillary lymph nodes. Thyroid gland, trachea, and esophagus demonstrate no significant findings. Lungs/Pleura:  Lungs are clear. No pleural effusion or pneumothorax. Musculoskeletal: No acute or suspicious bony abnormalities. CT ABDOMEN PELVIS FINDINGS Hepatobiliary: The liver and gallbladder are unremarkable. No biliary dilatation. Pancreas: Unremarkable Spleen: Unremarkable Adrenals/Urinary Tract: The kidneys, adrenal glands and bladder are unremarkable except for unchanged nonobstructing left lower pole renal calculus. Stomach/Bowel: No bowel obstruction or definite bowel wall thickening noted. Vascular/Lymphatic: Aortic atherosclerosis. No enlarged abdominal or pelvic lymph nodes. Reproductive: Patient is status post hysterectomy and bilateral oophorectomy. Other: A small amount of ascites adjacent to the liver and within the pelvis is new. Mild omental thickening/stranding and mild peritoneal thickening is unchanged. No definite new omental or peritoneal abnormality is noted. There is no evidence of pneumoperitoneum. Musculoskeletal: No acute or suspicious abnormalities. Degenerative and postsurgical changes in the lumbar spine again noted. IMPRESSION: New small amount of ascites within the abdomen and pelvis since 01/12/2017 which could indicate disease progression but no new identifiable tumor and no significant change in omental and mild peritoneal thickening. No evidence of metastatic disease within the chest. Coronary artery disease. Aortic Atherosclerosis (ICD10-I70.0). Electronically Signed   By: JMargarette CanadaM.D.   On: 05/04/2017 14:56   Ct Abdomen Pelvis W Contrast  Result Date: 05/04/2017 CLINICAL DATA:  66year old female for restaging of ovarian cancer. Chemotherapy completed 01/2017. Rising CA 125. EXAM: CT CHEST, ABDOMEN, AND PELVIS WITH CONTRAST TECHNIQUE: Multidetector CT imaging of the chest, abdomen and pelvis was performed following the standard protocol during bolus administration of intravenous contrast. CONTRAST:  1061mISOVUE-300 IOPAMIDOL (ISOVUE-300) INJECTION 61% COMPARISON:  01/12/2017 and  prior CTs FINDINGS: CT CHEST FINDINGS Cardiovascular: Mild coronary artery and thoracic aortic atherosclerotic calcifications again noted. There is no evidence of thoracic aortic aneurysm. Normal heart size. No pericardial effusion. A right Port-A-Cath is present with tip in the lower SVC. Mediastinum/Nodes: No enlarged mediastinal, hilar, or axillary lymph nodes. Thyroid gland, trachea, and esophagus demonstrate no significant findings. Lungs/Pleura: Lungs are clear. No pleural effusion or pneumothorax. Musculoskeletal: No acute or suspicious bony abnormalities. CT ABDOMEN PELVIS FINDINGS Hepatobiliary: The liver and gallbladder are unremarkable. No biliary dilatation. Pancreas: Unremarkable Spleen: Unremarkable Adrenals/Urinary Tract: The kidneys, adrenal glands and bladder are unremarkable except for unchanged nonobstructing left lower pole renal calculus. Stomach/Bowel:  No bowel obstruction or definite bowel wall thickening noted. Vascular/Lymphatic: Aortic atherosclerosis. No enlarged abdominal or pelvic lymph nodes. Reproductive: Patient is status post hysterectomy and bilateral oophorectomy. Other: A small amount of ascites adjacent to the liver and within the pelvis is new. Mild omental thickening/stranding and mild peritoneal thickening is unchanged. No definite new omental or peritoneal abnormality is noted. There is no evidence of pneumoperitoneum. Musculoskeletal: No acute or suspicious abnormalities. Degenerative and postsurgical changes in the lumbar spine again noted. IMPRESSION: New small amount of ascites within the abdomen and pelvis since 01/12/2017 which could indicate disease progression but no new identifiable tumor and no significant change in omental and mild peritoneal thickening. No evidence of metastatic disease within the chest. Coronary artery disease. Aortic Atherosclerosis (ICD10-I70.0). Electronically Signed   By: Margarette Canada M.D.   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 I review results of the test which showed that she is ER/PR positive HER-2/neu negative. The patient could possibly take antiestrogen therapy in the future for maintenance treatment. I reviewed the guidelines with the patient and her husband Given her morbid obesity, peripheral neuropathy and other complications, I recommend we proceed with Doxil with Avastin Echocardiogram is within normal limits I will omit Avastin with cycle 1 due to significant cardiovascular risk factors and to ensure that she does not develop GI complications The risks, benefits, side effects of chemotherapy including nausea, mucositis, congestive heart failure and risk of blood clots with chemotherapy were discussed and she agreed to proceed with care I will plan to see her back in 4 weeks to assess response to treatment and review of toxicity I plan to repeat imaging after 3 cycles of chemo along with tumor marker monitoring  Goals of care, counseling/discussion The patient is aware she has likely incurable disease and treatment is strictly palliative. We discussed importance of Advanced Directives and Living will.    Orders Placed This Encounter  Procedures  . CA 125    Standing Status:   Standing    Number of Occurrences:   22    Standing Expiration Date:   05/25/2018  . UA Protein, Dipstick - [facility]    Standing Status:   Standing    Number of Occurrences:   11    Standing Expiration Date:   05/25/2018   All questions were answered. The patient knows to  call the clinic with any problems, questions or concerns. No barriers to learning was detected. I spent 25 minutes counseling the patient face to face. The total time spent in the appointment was 30 minutes and more than 50% was on counseling and review of test results     Heath Lark, MD 05/25/2017 3:25 PM

## 2017-05-25 NOTE — Assessment & Plan Note (Signed)
The patient is aware she has likely incurable disease and treatment is strictly palliative. We discussed importance of Advanced Directives and Living will.

## 2017-05-25 NOTE — Assessment & Plan Note (Signed)
The patient had aggressive course of disease She has never been completely cancer free with detectable tumor markers despite chemotherapy Her recent significant rising tumor marker and CT imaging are suggestive of cancer relapse She is considered platinum refractory I will request pathologist to add additional testing for estrogen and progesterone receptors and HER-2/neu receptors to her prior surgical specimen. I review results of the test which showed that she is ER/PR positive HER-2/neu negative. The patient could possibly take antiestrogen therapy in the future for maintenance treatment. I reviewed the guidelines with the patient and her husband Given her morbid obesity, peripheral neuropathy and other complications, I recommend we proceed with Doxil with Avastin Echocardiogram is within normal limits I will omit Avastin with cycle 1 due to significant cardiovascular risk factors and to ensure that she does not develop GI complications The risks, benefits, side effects of chemotherapy including nausea, mucositis, congestive heart failure and risk of blood clots with chemotherapy were discussed and she agreed to proceed with care I will plan to see her back in 4 weeks to assess response to treatment and review of toxicity I plan to repeat imaging after 3 cycles of chemo along with tumor marker monitoring

## 2017-05-25 NOTE — Telephone Encounter (Signed)
Scheduled appt per 7/23 los - Gave patient AVS and calender per los.  

## 2017-05-26 ENCOUNTER — Telehealth: Payer: Self-pay | Admitting: *Deleted

## 2017-05-26 ENCOUNTER — Encounter: Payer: Self-pay | Admitting: Hematology and Oncology

## 2017-05-26 NOTE — Telephone Encounter (Signed)
-----   Message from Sinda Du, RN sent at 05/25/2017  2:20 PM EDT ----- Regarding: Dr. Alvy Bimler - 1st chemo f/u 1st Doxil

## 2017-05-26 NOTE — Telephone Encounter (Signed)
Called for chemo follow up. Drinking a lot. Denies any nausea or vomiting. No questions or concerns

## 2017-05-27 ENCOUNTER — Other Ambulatory Visit: Payer: Self-pay | Admitting: Hematology and Oncology

## 2017-05-28 ENCOUNTER — Other Ambulatory Visit (HOSPITAL_COMMUNITY): Payer: BC Managed Care – PPO

## 2017-05-28 ENCOUNTER — Ambulatory Visit (HOSPITAL_COMMUNITY): Payer: BC Managed Care – PPO | Admitting: Oncology

## 2017-05-29 ENCOUNTER — Telehealth: Payer: Self-pay

## 2017-05-29 DIAGNOSIS — C481 Malignant neoplasm of specified parts of peritoneum: Secondary | ICD-10-CM

## 2017-05-29 NOTE — Telephone Encounter (Signed)
Pt had 1st time doxil on Monday. She has gained 10# since Monday. Her belly is swollen and she feels bloated. Her legs are only mildly swollen. She takes lasix 20 daily. She has a hx of paracentesis 05/15/16. She has no SOB. Her LBM was yesterday, loose but not diarrhea. Appetite is OK. She had a sharp pain in her R lower abd this AM. She took pain med and it has significantly diminished.   Planning to drive to [facility] Monday morning.   S/w Dr Benay Spice and instructed to her to take lasix BID. Call on Monday if feeling same or worse before she drives to [place]. If she feels worse over weekend to call or go to ER.

## 2017-06-01 ENCOUNTER — Ambulatory Visit (HOSPITAL_COMMUNITY)
Admission: RE | Admit: 2017-06-01 | Discharge: 2017-06-01 | Disposition: A | Discharge status: Home / Self Care | Payer: BC Managed Care – PPO | Source: Ambulatory Visit | Attending: Oncology | Admitting: Oncology

## 2017-06-01 ENCOUNTER — Encounter (HOSPITAL_COMMUNITY): Payer: Self-pay | Admitting: Student

## 2017-06-01 DIAGNOSIS — R188 Other ascites: Secondary | ICD-10-CM | POA: Diagnosis not present

## 2017-06-01 DIAGNOSIS — C481 Malignant neoplasm of specified parts of peritoneum: Secondary | ICD-10-CM | POA: Insufficient documentation

## 2017-06-01 HISTORY — PX: IR PARACENTESIS: IMG2679

## 2017-06-01 MED ORDER — LIDOCAINE HCL (PF) 1 % IJ SOLN
INTRAMUSCULAR | Status: AC
  Filled 2017-06-01: qty 30

## 2017-06-01 NOTE — Telephone Encounter (Addendum)
"  Called Friday.  We cancelled our vacation to [place].  I was fine with the new Doxil treatment on 05-25-2017 until Thursday.    1. Having increased pain.  My abdomen, bones and bottom hurt.  It hurts to sit.  Does Doxil cause pain like the paclitaxel, carboplatin did to me last year?  Since Thursday, I am now having to take hydrocodone 10-325 mg every four hours. 2. Today's update is I have not yet lost the ten pounds I gained.  I weigh 233 lbs in only my undergarments.  Drink 64 oz water daily, eat well fruit vegetables, lots of fiber to prevent constipation.  Ankles very swollen yesterday not as bad this morning.  Have been on lasix every evening since 2016 back surgery. 3. My abdomen feels full like when I was diagnosed.  Pants are a little tighter, abdomen is bloating.  Had two diarrhea stools Sunday and two soft stools today.  How do you gain weight with bowels moving.  Take [name] due to hydrocodone use.   I may have a little trouble breathing.  I've had two paracentesis.  You have that information.  (History of two Paracentesis on 05-01-2016 = 1.8 L and 05-15-2016 = 3400 ml.)  Return number [phone]."    Will notify covering providers.  Next F/U and treatment scheduled 06-22-2017.

## 2017-06-01 NOTE — Telephone Encounter (Signed)
Per report, 2.8 L yellow fluid with today's paracentesis.  Llasix orders unchanged at this time.

## 2017-06-01 NOTE — Addendum Note (Signed)
Addended by: Cherylynn Ridges on: 06/01/2017 10:43 AM   Modules accepted: Orders

## 2017-06-01 NOTE — Telephone Encounter (Signed)
Verbal order received and read back from Dr. Alen Blew for Ultrasound and paracentesis.  If no fluid, increase lasix to 40 mg daily.  Entered orders for [employer].  Order given to patient at this time.   Central scheduling notified of orders.  [facility] has first available appointment.  Arrive at 12:45 pm for 1:00 pm.  Forestine Na requires Prior authorization

## 2017-06-01 NOTE — Procedures (Signed)
PROCEDURE SUMMARY:  Successful US guided paracentesis from right lateral abdomen.  Yielded 2.8 liters of yellow fluid.  No immediate complications.  Pt tolerated well.   Specimen was not sent for labs.  Docia Barrier PA-C 06/01/2017 1:46 PM

## 2017-06-02 ENCOUNTER — Telehealth: Payer: Self-pay

## 2017-06-02 NOTE — Telephone Encounter (Signed)
There were a few times during the procedure where there was pain that she had to grab something. Now she has shooting pains on the side of paracentesis, right side. It is immediately gone. It happens when she goes from sitting to standing. The pains are better than yesterday. No BM since paracentesis. Several BMs before procedure from miralax. Urinating OK. No fever.  She feels less tight, using less pain medication. Instructed to keep monitoring and if continues to improve is good if she gets worse to call again.

## 2017-06-11 ENCOUNTER — Telehealth: Payer: Self-pay | Admitting: Pediatrics

## 2017-06-11 DIAGNOSIS — E118 Type 2 diabetes mellitus with unspecified complications: Secondary | ICD-10-CM

## 2017-06-11 DIAGNOSIS — Z794 Long term (current) use of insulin: Principal | ICD-10-CM

## 2017-06-11 NOTE — Telephone Encounter (Signed)
Patient is out of town and states that she has been having trouble. She states that she has been getting more hypoglycemia since starting her new chemo regimen.  4 to 5 times per week she is getting readings 70 to 80 which concerns her.   This am BG was 71, she ate and did not bolus any.  2 hours after eating BG was 131.    Current Pump Setting:             Basal:   MN to 2am = 2.10                          2am to 8am = 2.25                          8am to 5pm = 2.25                          5pm to MN = 1.50  Bolus settings - Insulin to CHO ratio = 8 Insulin Sensitivity = 13  Recommend change pump setting to the following:  Basal: MN to 8am = 2.10 8am to 5pm = 2.15 5pm to MN = 1.90  Insulin to CHO ratio = 8 Insulin Sensitivity = 13  Patient was also notified that my last day will be August 17th.  Referral sent for endocrinologist.  Pt requested [facility] in [place], [place]

## 2017-06-16 ENCOUNTER — Telehealth: Payer: Self-pay | Admitting: Pediatrics

## 2017-06-16 ENCOUNTER — Encounter (HOSPITAL_COMMUNITY): Payer: BC Managed Care – PPO

## 2017-06-16 NOTE — Telephone Encounter (Signed)
Called over to [facility] and was having difficulty setting appointment.  She decided instead to see Dr Cruzita Lederer.  I discussed that she is a great choice and let her know that our referral office is working on this referral.

## 2017-06-19 ENCOUNTER — Encounter: Payer: Self-pay | Admitting: Pharmacist

## 2017-06-21 ENCOUNTER — Other Ambulatory Visit: Payer: Self-pay | Admitting: Pharmacist

## 2017-06-22 ENCOUNTER — Other Ambulatory Visit: Payer: Self-pay | Admitting: Pediatrics

## 2017-06-22 ENCOUNTER — Encounter: Payer: Self-pay | Admitting: Hematology and Oncology

## 2017-06-22 ENCOUNTER — Ambulatory Visit: Payer: BC Managed Care – PPO

## 2017-06-22 ENCOUNTER — Telehealth: Payer: Self-pay | Admitting: Hematology and Oncology

## 2017-06-22 ENCOUNTER — Other Ambulatory Visit (HOSPITAL_BASED_OUTPATIENT_CLINIC_OR_DEPARTMENT_OTHER): Payer: BC Managed Care – PPO

## 2017-06-22 ENCOUNTER — Ambulatory Visit (HOSPITAL_BASED_OUTPATIENT_CLINIC_OR_DEPARTMENT_OTHER): Payer: BC Managed Care – PPO

## 2017-06-22 ENCOUNTER — Ambulatory Visit (HOSPITAL_BASED_OUTPATIENT_CLINIC_OR_DEPARTMENT_OTHER): Payer: BC Managed Care – PPO | Admitting: Hematology and Oncology

## 2017-06-22 DIAGNOSIS — Z79899 Other long term (current) drug therapy: Secondary | ICD-10-CM | POA: Diagnosis not present

## 2017-06-22 DIAGNOSIS — C801 Malignant (primary) neoplasm, unspecified: Secondary | ICD-10-CM

## 2017-06-22 DIAGNOSIS — E119 Type 2 diabetes mellitus without complications: Secondary | ICD-10-CM

## 2017-06-22 DIAGNOSIS — C569 Malignant neoplasm of unspecified ovary: Secondary | ICD-10-CM

## 2017-06-22 DIAGNOSIS — Z5111 Encounter for antineoplastic chemotherapy: Secondary | ICD-10-CM

## 2017-06-22 DIAGNOSIS — Z5112 Encounter for antineoplastic immunotherapy: Secondary | ICD-10-CM | POA: Diagnosis not present

## 2017-06-22 DIAGNOSIS — D6481 Anemia due to antineoplastic chemotherapy: Secondary | ICD-10-CM

## 2017-06-22 DIAGNOSIS — Z794 Long term (current) use of insulin: Secondary | ICD-10-CM

## 2017-06-22 DIAGNOSIS — C481 Malignant neoplasm of specified parts of peritoneum: Secondary | ICD-10-CM

## 2017-06-22 DIAGNOSIS — E538 Deficiency of other specified B group vitamins: Secondary | ICD-10-CM

## 2017-06-22 DIAGNOSIS — C786 Secondary malignant neoplasm of retroperitoneum and peritoneum: Secondary | ICD-10-CM

## 2017-06-22 DIAGNOSIS — T451X5A Adverse effect of antineoplastic and immunosuppressive drugs, initial encounter: Secondary | ICD-10-CM

## 2017-06-22 LAB — CBC WITH DIFFERENTIAL/PLATELET
BASO%: 0.7 % (ref 0.0–2.0)
Basophils Absolute: 0 10*3/uL (ref 0.0–0.1)
EOS ABS: 0.1 10*3/uL (ref 0.0–0.5)
EOS%: 2.6 % (ref 0.0–7.0)
HCT: 27.6 % — ABNORMAL LOW (ref 34.8–46.6)
HGB: 9.1 g/dL — ABNORMAL LOW (ref 11.6–15.9)
LYMPH#: 0.4 10*3/uL — AB (ref 0.9–3.3)
LYMPH%: 8.2 % — ABNORMAL LOW (ref 14.0–49.7)
MCH: 30.8 pg (ref 25.1–34.0)
MCHC: 32.8 g/dL (ref 31.5–36.0)
MCV: 93.7 fL (ref 79.5–101.0)
MONO#: 0.6 10*3/uL (ref 0.1–0.9)
MONO%: 12.1 % (ref 0.0–14.0)
NEUT%: 76.4 % (ref 38.4–76.8)
NEUTROS ABS: 3.5 10*3/uL (ref 1.5–6.5)
Platelets: 259 10*3/uL (ref 145–400)
RBC: 2.95 10*6/uL — AB (ref 3.70–5.45)
RDW: 14.7 % — AB (ref 11.2–14.5)
WBC: 4.6 10*3/uL (ref 3.9–10.3)

## 2017-06-22 LAB — COMPREHENSIVE METABOLIC PANEL
ALT: 15 U/L (ref 0–55)
ANION GAP: 11 meq/L (ref 3–11)
AST: 16 U/L (ref 5–34)
Albumin: 3.1 g/dL — ABNORMAL LOW (ref 3.5–5.0)
Alkaline Phosphatase: 85 U/L (ref 40–150)
BILIRUBIN TOTAL: 0.34 mg/dL (ref 0.20–1.20)
BUN: 26.1 mg/dL — ABNORMAL HIGH (ref 7.0–26.0)
CHLORIDE: 106 meq/L (ref 98–109)
CO2: 25 meq/L (ref 22–29)
CREATININE: 1.2 mg/dL — AB (ref 0.6–1.1)
Calcium: 9.7 mg/dL (ref 8.4–10.4)
EGFR: 50 mL/min/{1.73_m2} — ABNORMAL LOW (ref >90)
GLUCOSE: 73 mg/dL (ref 70–140)
Potassium: 4.6 mEq/L (ref 3.5–5.1)
SODIUM: 142 meq/L (ref 136–145)
TOTAL PROTEIN: 7 g/dL (ref 6.4–8.3)

## 2017-06-22 LAB — UA PROTEIN, DIPSTICK - CHCC: Protein, ur: NEGATIVE mg/dL

## 2017-06-22 MED ORDER — HEPARIN SOD (PORK) LOCK FLUSH 100 UNIT/ML IV SOLN
500.0000 [IU] | Freq: Once | INTRAVENOUS | Status: AC | PRN
  Administered 2017-06-22: 500 [IU]
  Filled 2017-06-22: qty 5

## 2017-06-22 MED ORDER — SODIUM CHLORIDE 0.9% FLUSH
10.0000 mL | INTRAVENOUS | Status: DC | PRN
  Administered 2017-06-22: 10 mL via INTRAVENOUS
  Filled 2017-06-22: qty 10

## 2017-06-22 MED ORDER — DOXORUBICIN HCL LIPOSOMAL CHEMO INJECTION 2 MG/ML
38.0000 mg/m2 | Freq: Once | INTRAVENOUS | Status: AC
  Administered 2017-06-22: 80 mg via INTRAVENOUS
  Filled 2017-06-22: qty 40

## 2017-06-22 MED ORDER — DIAZEPAM 5 MG PO TABS: 5.0000 mg | ORAL_TABLET | ORAL | 1 refills | Status: DC | PRN

## 2017-06-22 MED ORDER — DEXAMETHASONE SODIUM PHOSPHATE 10 MG/ML IJ SOLN
INTRAMUSCULAR | Status: AC
  Filled 2017-06-22: qty 1

## 2017-06-22 MED ORDER — DEXAMETHASONE SODIUM PHOSPHATE 10 MG/ML IJ SOLN
10.0000 mg | Freq: Once | INTRAMUSCULAR | Status: AC
  Administered 2017-06-22: 10 mg via INTRAVENOUS

## 2017-06-22 MED ORDER — SODIUM CHLORIDE 0.9 % IV SOLN
10.0000 mg/kg | Freq: Once | INTRAVENOUS | Status: AC
  Administered 2017-06-22: 1000 mg via INTRAVENOUS
  Filled 2017-06-22: qty 32

## 2017-06-22 MED ORDER — SODIUM CHLORIDE 0.9% FLUSH
10.0000 mL | INTRAVENOUS | Status: DC | PRN
  Administered 2017-06-22: 10 mL
  Filled 2017-06-22: qty 10

## 2017-06-22 MED ORDER — HYDROCODONE-ACETAMINOPHEN 10-325 MG PO TABS: 1.0000 | ORAL_TABLET | ORAL | 0 refills | Status: DC | PRN

## 2017-06-22 MED ORDER — SODIUM CHLORIDE 0.9 % IV SOLN
Freq: Once | INTRAVENOUS | Status: AC
  Administered 2017-06-22: 15:00:00 via INTRAVENOUS

## 2017-06-22 MED ORDER — DEXTROSE 5 % IV SOLN
Freq: Once | INTRAVENOUS | Status: AC
  Administered 2017-06-22: 15:00:00 via INTRAVENOUS

## 2017-06-22 NOTE — Telephone Encounter (Signed)
Scheduled appt per 8/20 los - Gave patient AVS and calender per los.  

## 2017-06-22 NOTE — Patient Instructions (Signed)
[facility] Discharge Instructions for Patients Receiving Chemotherapy  Today you received the following chemotherapy agents Doxil/Avastin  To help prevent nausea and vomiting after your treatment, we encourage you to take your nausea medication    If you develop nausea and vomiting that is not controlled by your nausea medication, call the clinic.   BELOW ARE SYMPTOMS THAT SHOULD BE REPORTED IMMEDIATELY:  *FEVER GREATER THAN 100.5 F  *CHILLS WITH OR WITHOUT FEVER  NAUSEA AND VOMITING THAT IS NOT CONTROLLED WITH YOUR NAUSEA MEDICATION  *UNUSUAL SHORTNESS OF BREATH  *UNUSUAL BRUISING OR BLEEDING  TENDERNESS IN MOUTH AND THROAT WITH OR WITHOUT PRESENCE OF ULCERS  *URINARY PROBLEMS  *BOWEL PROBLEMS  UNUSUAL RASH Items with * indicate a potential emergency and should be followed up as soon as possible.  Feel free to call the clinic you have any questions or concerns. The clinic phone number is [phone].  Please show the [facility] at check-in to the Emergency Department and triage nurse.

## 2017-06-23 ENCOUNTER — Other Ambulatory Visit: Payer: Self-pay | Admitting: Hematology and Oncology

## 2017-06-23 ENCOUNTER — Ambulatory Visit (HOSPITAL_COMMUNITY): Payer: BC Managed Care – PPO | Admitting: Oncology

## 2017-06-23 DIAGNOSIS — C481 Malignant neoplasm of specified parts of peritoneum: Secondary | ICD-10-CM

## 2017-06-23 LAB — CA 125

## 2017-06-23 NOTE — Assessment & Plan Note (Signed)
Her blood sugar control is stable while on treatment She will continue insulin treatment as directed 

## 2017-06-23 NOTE — Assessment & Plan Note (Signed)
Overall, she tolerated treatment well except for some mild constipation recently She denies mucositis, nausea, chest pain or shortness of breath She does not feel that she needs repeat paracentesis At the time of dictation, tumor markers are markedly elevated I recommend she proceed with total 2 cycles of treatment and I plan to repeat CT scan to evaluate prior to cycle 3 of treatment

## 2017-06-23 NOTE — Progress Notes (Signed)
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 This may potentially be ovarian or a GI in etiology.      04/30/2016 Tumor Marker    CA 125- 7149.0 (H)      05/01/2016 Procedure    US paracentesis- Successful ultrasound-guided paracentesis yielding 1.8 liters of peritoneal fluid.      05/01/2016 Imaging    US pelvis- Both transabdominal and transvaginal sonography are significantly limited by large patient habitus and ascites. Neither uterus or ovaries were visualized on this exam.      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      05/08/2016 Imaging    CT chest- No evidence of metastatic disease in the chest. Peritoneal/omental disease with abdominal ascites in the upper abdomen, incompletely visualized.       05/13/2016 Procedure    Placement of single lumen port a cath via right internal jugular vein. The catheter tip lies at the cavoatrial junction. A power injectable port a cath was placed and is ready for immediate use.      05/15/2016 Procedure    US Paracentesis- 3400 ml yellow colored ascites removed      05/15/2016 - 09/18/2016 Chemotherapy    Carboplatin/Paclitaxel every 21 days x 7 cycles      07/01/2016 Miscellaneous    Genetic Counseling by [name]-  Genetic testing was normal, and did not reveal a deleterious mutation in these genes.       07/08/2016 Imaging    CT CAP- 1. Small volume ascites, significantly  decreased. 2. Stable diffuse omental soft tissue caking and diffuse peritoneal thickening along the bilateral paracolic gutters and bilateral pelvic peritoneal reflections, consistent with peritoneal carcinomatosis. 3. Stable asymmetrically enlarged right ovary, which may represent the primary site of ovarian malignancy. 4. No evidence of metastatic disease in the chest. No new sites of metastatic disease in the abdomen or pelvis.      07/09/2016 Miscellaneous    Gyn Onc re-evaluation- modest response to therapy, 3 more cycles of chemotherapy recommended.        09/11/2016 Imaging    CT C/A/P No significant change omental soft tissue caking, consistent with metastatic disease. Mild ascites is decreased since previous study.  Increased calcification along peritoneal surface in pelvic cul-de-sac, consistent with treated peritoneal metastatic disease.  Stable 4.5cm homogeneous right pelvic mass, which favors a uterine fibroid although right ovarian neoplasm cannot definitely be excluded.  No new or progressive metastatic disease identified. No evidence of metastatic disease within the thorax.       10/14/2016 Procedure    Robotic-assisted laparoscopic total hysterectomy with bilateral salpingoophorectomy, ex lap omentectomy, radical tumor debulking by Dr. Denman George      10/17/2016 Pathology Results    Diagnosis 1. 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 - CERVIX AND ENDOMETRIUM FREE OF TUMOR. - SEE ONCOLOGY TABLE AND COMMENT. 2. Soft tissue, biopsy, umbilical nodule - HIGH GRADE SEROUS CARCINOMA. 3.  Omentum, resection for tumor - HIGH GRADE SEROUS CARCINOMA, 33 CM.      11/06/2016 - 12/25/2016 Chemotherapy    Carboplatin/Paclitaxel x 3 cycles       01/12/2017 Imaging    CT CAP- 1. Interval hysterectomy, bilateral salpingo-oophorectomy and omentectomy without evidence of  tumor recurrence. 2. No evidence of metastatic disease. 3. 5 mm nonobstructing lower pole left renal calculus.      01/13/2017 Remission    No evidence of residual disease on CT imaging.      05/04/2017 Imaging    CT CAP- New small amount of ascites within the abdomen and pelvis since 01/12/2017 which could indicate disease progression but no new identifiable tumor and no significant change in omental and mild peritoneal thickening.  No evidence of metastatic disease within the chest.  Coronary artery disease.  Aortic Atherosclerosis (ICD10-I70.0).      05/04/2017 Tumor Marker    Patient's tumor was tested for the following markers: CA 125 Results of the tumor marker test revealed [id number]      05/18/2017 Imaging    ECHO; EF 60% -  65      05/25/2017 -  Chemotherapy    She received Doxil and Avastin      06/01/2017 Procedure    Successful ultrasound-guided therapeutic paracentesis yielding 2.8 liters of peritoneal fluid      06/22/2017 Tumor Marker    Patient's tumor was tested for the following markers: CA 125 Results of the tumor marker test revealed [id number]       INTERVAL HISTORY: Please see below for problem oriented charting. She is seen prior to cycle 2 of treatment She required paracentesis recently due to abdominal tightness She feels fine today She denies worsening neuropathy while on treatment No recent chest pain or shortness of breath She has intermittent abdominal pain up until recently She had recent constipation, resolved with laxatives Her blood sugar control is stable She is not happy with the way things are set up in this cancer center compared to her prior treatment center She has been waiting for some time to be assessed today  REVIEW OF SYSTEMS:   Constitutional: Denies fevers, chills or abnormal weight loss Eyes: Denies blurriness of vision Ears, nose, mouth, throat, and face: Denies mucositis or sore throat Respiratory: Denies cough, dyspnea or  wheezes Cardiovascular: Denies palpitation, chest discomfort or lower extremity swelling Skin: Denies abnormal skin rashes Lymphatics: Denies new lymphadenopathy or easy bruising Neurological:Denies numbness, tingling or new weaknesses Behavioral/Psych: Mood is stable, no new changes  All other systems were reviewed with the patient and are negative.  I have reviewed the past medical history, past surgical history, social history and family history with the patient and they are unchanged from previous note.  ALLERGIES:  is allergic to avandia [rosiglitazone]; micronase [glyburide]; and actos [pioglitazone].  MEDICATIONS:  Current Outpatient Prescriptions  Medication Sig Dispense Refill  . furosemide (LASIX) 20 MG tablet Take 20 mg by mouth 2 (two) times daily.    . vitamin B-12 (CYANOCOBALAMIN) 1000 MCG tablet Take 1,000 mcg by mouth daily.    Marland Kitchen atorvastatin (LIPITOR) 40 MG tablet Take 1 tablet (40 mg total) by mouth daily. 90 tablet 1  . benazepril (LOTENSIN) 10 MG tablet Take 1 tablet (10 mg total) by mouth daily. 90 tablet 1  . diazepam (VALIUM) 5 MG tablet Take 1 tablet (5 mg total) by mouth every 4 (four) hours as needed (spasms). 60 tablet 1  . furosemide (LASIX) 20 MG tablet Take  1 tablet (20 mg total) by mouth daily. 90 tablet 1  . glucagon (GLUCAGON EMERGENCY) 1 MG injection Inject as directed as needed for low blood glucose if unresponsive. (Patient not taking: Reported on 05/25/2017) 1 each 0  . glucosamine-chondroitin 500-400 MG tablet Take 1 tablet by mouth 2 (two) times daily.    Marland Kitchen HYDROcodone-acetaminophen (NORCO) 10-325 MG tablet Take 1 tablet by mouth every 4 (four) hours as needed. 90 tablet 0  . lidocaine-prilocaine (EMLA) cream Apply a quarter size amount to port site 1 hour prior to chemo. Do not rub in. Cover with plastic wrap. 30 g 3  . metFORMIN (GLUCOPHAGE) 1000 MG tablet TAKE 1 TABLET BY MOUTH TWICE DAILY WITH A MEAL 180 tablet 1  . metFORMIN (GLUCOPHAGE) 1000 MG  tablet TAKE 1 TABLET BY MOUTH TWICE DAILY WITH A MEAL 180 tablet 0  . Multiple Vitamin (MULTIVITAMIN WITH MINERALS) TABS Take 1 tablet by mouth daily.    . multivitamin-lutein (OCUVITE-LUTEIN) CAPS capsule Take 1 capsule by mouth daily.    Marland Kitchen NOVOLOG 100 UNIT/ML injection USE IN INSULIN PUMP AS DIRECTED. MAX DAILY DOSE OF 110 UNITS PER DAY 120 mL 0  . omeprazole (PRILOSEC) 40 MG capsule     . ondansetron (ZOFRAN) 8 MG tablet Take by mouth every 8 (eight) hours as needed for nausea or vomiting.    . ONE TOUCH ULTRA TEST test strip USE TO CHECK BLOOD SUGAR UP TO 5 TIMES A DAY 450 each 2  . polyethylene glycol (MIRALAX / GLYCOLAX) packet Take 17 g by mouth daily.    Marland Kitchen senna (SENOKOT) 8.6 MG TABS tablet Take 1 tablet (8.6 mg total) by mouth at bedtime as needed for moderate constipation. 120 each 0  . vitamin E (VITAMIN E) 400 UNIT capsule Take 400 Units by mouth daily.     No current facility-administered medications for this visit.     PHYSICAL EXAMINATION: ECOG PERFORMANCE STATUS: 1 - Symptomatic but completely ambulatory  Vitals:   06/22/17 1314  BP: (!) 149/54  Pulse: 94  Resp: 18  Temp: 98.1 F (36.7 C)  SpO2: 95%   Filed Weights   06/22/17 1314  Weight: 221 lb 6.4 oz (100.4 kg)    GENERAL:alert, no distress and comfortable.  She is morbidly obese, limited exam due to obesity SKIN: skin color, texture, turgor are normal, no rashes or significant lesions EYES: normal, Conjunctiva are pink and non-injected, sclera clear OROPHARYNX:no exudate, no erythema and lips, buccal mucosa, and tongue normal  NECK: supple, thyroid normal size, non-tender, without nodularity LYMPH:  no palpable lymphadenopathy in the cervical, axillary or inguinal LUNGS: clear to auscultation and percussion with normal breathing effort HEART: regular rate & rhythm and no murmurs and no lower extremity edema ABDOMEN:abdomen soft, non-tender and normal bowel sounds Musculoskeletal:no cyanosis of digits and no  clubbing  NEURO: alert & oriented x 3 with fluent speech, no focal motor/sensory deficits  LABORATORY DATA:  I have reviewed the data as listed    Component Value Date/Time   NA 142 06/22/2017 1147   K 4.6 06/22/2017 1147   CL 105 04/27/2017 1003   CO2 25 06/22/2017 1147   GLUCOSE 73 06/22/2017 1147   BUN 26.1 (H) 06/22/2017 1147   CREATININE 1.2 (H) 06/22/2017 1147   CALCIUM 9.7 06/22/2017 1147   PROT 7.0 06/22/2017 1147   ALBUMIN 3.1 (L) 06/22/2017 1147   AST 16 06/22/2017 1147   ALT 15 06/22/2017 1147   ALKPHOS 85 06/22/2017 1147  BILITOT 0.34 06/22/2017 1147   GFRNONAA >60 04/27/2017 1003   GFRAA >60 04/27/2017 1003    No results found for: SPEP, UPEP  Lab Results  Component Value Date   WBC 4.6 06/22/2017   NEUTROABS 3.5 06/22/2017   HGB 9.1 (L) 06/22/2017   HCT 27.6 (L) 06/22/2017   MCV 93.7 06/22/2017   PLT 259 06/22/2017      Chemistry      Component Value Date/Time   NA 142 06/22/2017 1147   K 4.6 06/22/2017 1147   CL 105 04/27/2017 1003   CO2 25 06/22/2017 1147   BUN 26.1 (H) 06/22/2017 1147   CREATININE 1.2 (H) 06/22/2017 1147      Component Value Date/Time   CALCIUM 9.7 06/22/2017 1147   ALKPHOS 85 06/22/2017 1147   AST 16 06/22/2017 1147   ALT 15 06/22/2017 1147   BILITOT 0.34 06/22/2017 1147       RADIOGRAPHIC STUDIES: I have personally reviewed the radiological images as listed and agreed with the findings in the report. Ir Paracentesis  Result Date: 06/01/2017 INDICATION: Patient with extra ovarian primary peritoneal carcinoma, now with ascites. Request is made for therapeutic paracentesis. EXAM: ULTRASOUND GUIDED THERAPEUTIC PARACENTESIS MEDICATIONS: 10 mL 1% lidocaine COMPLICATIONS: None immediate. PROCEDURE: Informed written consent was obtained from the patient after a discussion of the risks, benefits and alternatives to treatment. A timeout was performed prior to the initiation of the procedure. Initial ultrasound scanning  demonstrates a large amount of ascites within the right lateral abdomen. The right lateral abdomen was prepped and draped in the usual sterile fashion. 1% lidocaine was used for local anesthesia. Following this, a 19 gauge, 7-cm, Yueh catheter was introduced. An ultrasound image was saved for documentation purposes. The paracentesis was performed. The catheter was removed and a dressing was applied. The patient tolerated the procedure well without immediate post procedural complication. FINDINGS: A total of approximately 2.8 liters of yellow fluid was removed. Samples were sent to the laboratory as requested by the clinical team. IMPRESSION: Successful ultrasound-guided therapeutic paracentesis yielding 2.8 liters of peritoneal fluid. Read by:  Brynda Greathouse PA-C Electronically Signed   By: Marybelle Killings M.D.   On: 06/01/2017 13:49    ASSESSMENT & PLAN:  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HCC) Overall, she tolerated treatment well except for some mild constipation recently She denies mucositis, nausea, chest pain or shortness of breath She does not feel that she needs repeat paracentesis At the time of dictation, tumor markers are markedly elevated I recommend she proceed with total 2 cycles of treatment and I plan to repeat CT scan to evaluate prior to cycle 3 of treatment  Anemia due to antineoplastic chemotherapy This is likely due to recent treatment. The patient denies recent history of bleeding such as epistaxis, hematuria or hematochezia. She is asymptomatic from the anemia. I will observe for now.  She does not require transfusion now. I will continue the chemotherapy at current dose without dosage adjustment.  If the anemia gets progressive worse in the future, I might have to delay her treatment or adjust the chemotherapy dose.   Type 2 diabetes mellitus treated with insulin (HCC) Her blood sugar control is stable while on treatment She will continue insulin treatment as  directed   No orders of the defined types were placed in this encounter.  All questions were answered. The patient knows to call the clinic with any problems, questions or concerns. No barriers to learning was detected. I spent 15 minutes  counseling the patient face to face. The total time spent in the appointment was 20 minutes and more than 50% was on counseling and review of test results     Heath Lark, MD 06/23/2017 2:16 PM

## 2017-06-23 NOTE — Assessment & Plan Note (Signed)

## 2017-06-29 ENCOUNTER — Other Ambulatory Visit (HOSPITAL_COMMUNITY): Payer: BC Managed Care – PPO

## 2017-06-29 ENCOUNTER — Ambulatory Visit (HOSPITAL_COMMUNITY): Payer: BC Managed Care – PPO | Admitting: Oncology

## 2017-07-07 ENCOUNTER — Other Ambulatory Visit (HOSPITAL_BASED_OUTPATIENT_CLINIC_OR_DEPARTMENT_OTHER): Payer: BC Managed Care – PPO

## 2017-07-07 ENCOUNTER — Ambulatory Visit (HOSPITAL_BASED_OUTPATIENT_CLINIC_OR_DEPARTMENT_OTHER): Payer: BC Managed Care – PPO

## 2017-07-07 ENCOUNTER — Other Ambulatory Visit: Payer: Self-pay | Admitting: Hematology and Oncology

## 2017-07-07 ENCOUNTER — Ambulatory Visit: Payer: BC Managed Care – PPO

## 2017-07-07 VITALS — BP 124/47 | HR 79 | Temp 97.7°F | Resp 16

## 2017-07-07 DIAGNOSIS — T451X5A Adverse effect of antineoplastic and immunosuppressive drugs, initial encounter: Principal | ICD-10-CM

## 2017-07-07 DIAGNOSIS — C786 Secondary malignant neoplasm of retroperitoneum and peritoneum: Secondary | ICD-10-CM

## 2017-07-07 DIAGNOSIS — C569 Malignant neoplasm of unspecified ovary: Secondary | ICD-10-CM

## 2017-07-07 DIAGNOSIS — C481 Malignant neoplasm of specified parts of peritoneum: Secondary | ICD-10-CM

## 2017-07-07 DIAGNOSIS — E538 Deficiency of other specified B group vitamins: Secondary | ICD-10-CM

## 2017-07-07 DIAGNOSIS — D61810 Antineoplastic chemotherapy induced pancytopenia: Secondary | ICD-10-CM

## 2017-07-07 DIAGNOSIS — Z5112 Encounter for antineoplastic immunotherapy: Secondary | ICD-10-CM | POA: Diagnosis not present

## 2017-07-07 LAB — COMPREHENSIVE METABOLIC PANEL
ALT: 21 U/L (ref 0–55)
AST: 26 U/L (ref 5–34)
Albumin: 3.2 g/dL — ABNORMAL LOW (ref 3.5–5.0)
Alkaline Phosphatase: 123 U/L (ref 40–150)
Anion Gap: 10 mEq/L (ref 3–11)
BILIRUBIN TOTAL: 0.37 mg/dL (ref 0.20–1.20)
BUN: 32.5 mg/dL — AB (ref 7.0–26.0)
CO2: 23 meq/L (ref 22–29)
CREATININE: 1.2 mg/dL — AB (ref 0.6–1.1)
Calcium: 9.8 mg/dL (ref 8.4–10.4)
Chloride: 108 mEq/L (ref 98–109)
EGFR: 47 mL/min/{1.73_m2} — ABNORMAL LOW (ref >90)
GLUCOSE: 109 mg/dL (ref 70–140)
Potassium: 4.7 mEq/L (ref 3.5–5.1)
SODIUM: 142 meq/L (ref 136–145)
TOTAL PROTEIN: 7.6 g/dL (ref 6.4–8.3)

## 2017-07-07 LAB — CBC WITH DIFFERENTIAL/PLATELET
BASO%: 0.2 % (ref 0.0–2.0)
Basophils Absolute: 0 10*3/uL (ref 0.0–0.1)
EOS%: 4 % (ref 0.0–7.0)
Eosinophils Absolute: 0.2 10*3/uL (ref 0.0–0.5)
HCT: 26.9 % — ABNORMAL LOW (ref 34.8–46.6)
HGB: 8.3 g/dL — ABNORMAL LOW (ref 11.6–15.9)
LYMPH#: 0.4 10*3/uL — AB (ref 0.9–3.3)
LYMPH%: 9.2 % — ABNORMAL LOW (ref 14.0–49.7)
MCH: 30 pg (ref 25.1–34.0)
MCHC: 30.9 g/dL — AB (ref 31.5–36.0)
MCV: 97.1 fL (ref 79.5–101.0)
MONO#: 0.3 10*3/uL (ref 0.1–0.9)
MONO%: 7.7 % (ref 0.0–14.0)
NEUT%: 78.9 % — ABNORMAL HIGH (ref 38.4–76.8)
NEUTROS ABS: 3.2 10*3/uL (ref 1.5–6.5)
NRBC: 0 % (ref 0–0)
Platelets: 176 10*3/uL (ref 145–400)
RBC: 2.77 10*6/uL — AB (ref 3.70–5.45)
RDW: 15.3 % — AB (ref 11.2–14.5)
WBC: 4 10*3/uL (ref 3.9–10.3)

## 2017-07-07 MED ORDER — SODIUM CHLORIDE 0.9% FLUSH
10.0000 mL | INTRAVENOUS | Status: DC | PRN
  Administered 2017-07-07: 10 mL
  Filled 2017-07-07: qty 10

## 2017-07-07 MED ORDER — SODIUM CHLORIDE 0.9 % IV SOLN
Freq: Once | INTRAVENOUS | Status: AC
  Administered 2017-07-07: 15:00:00 via INTRAVENOUS

## 2017-07-07 MED ORDER — SODIUM CHLORIDE 0.9% FLUSH
10.0000 mL | Freq: Once | INTRAVENOUS | Status: AC
  Administered 2017-07-07: 10 mL
  Filled 2017-07-07: qty 10

## 2017-07-07 MED ORDER — HEPARIN SOD (PORK) LOCK FLUSH 100 UNIT/ML IV SOLN
500.0000 [IU] | Freq: Once | INTRAVENOUS | Status: AC | PRN
  Administered 2017-07-07: 500 [IU]
  Filled 2017-07-07: qty 5

## 2017-07-07 MED ORDER — SODIUM CHLORIDE 0.9 % IV SOLN
10.0000 mg/kg | Freq: Once | INTRAVENOUS | Status: AC
  Administered 2017-07-07: 1000 mg via INTRAVENOUS
  Filled 2017-07-07: qty 32

## 2017-07-07 MED ORDER — FUROSEMIDE 20 MG PO TABS: 20.0000 mg | ORAL_TABLET | Freq: Two times a day (BID) | ORAL | 1 refills | Status: DC

## 2017-07-07 NOTE — Progress Notes (Signed)
[place] for treatment today with today's labs per Dr. Alvy Bimler.

## 2017-07-07 NOTE — Patient Instructions (Signed)
Implanted Port Home Guide An implanted port is a type of central line that is placed under the skin. Central lines are used to provide IV access when treatment or nutrition needs to be given through a person's veins. Implanted ports are used for long-term IV access. An implanted port may be placed because:  You need IV medicine that would be irritating to the small veins in your hands or arms.  You need long-term IV medicines, such as antibiotics.  You need IV nutrition for a long period.  You need frequent blood draws for lab tests.  You need dialysis.  Implanted ports are usually placed in the chest area, but they can also be placed in the upper arm, the abdomen, or the leg. An implanted port has two main parts:  Reservoir. The reservoir is round and will appear as a small, raised area under your skin. The reservoir is the part where a needle is inserted to give medicines or draw blood.  Catheter. The catheter is a thin, flexible tube that extends from the reservoir. The catheter is placed into a large vein. Medicine that is inserted into the reservoir goes into the catheter and then into the vein.  How will I care for my incision site? Do not get the incision site wet. Bathe or shower as directed by your health care provider. How is my port accessed? Special steps must be taken to access the port:  Before the port is accessed, a numbing cream can be placed on the skin. This helps numb the skin over the port site.  Your health care provider uses a sterile technique to access the port. ? Your health care provider must put on a mask and sterile gloves. ? The skin over your port is cleaned carefully with an antiseptic and allowed to dry. ? The port is gently pinched between sterile gloves, and a needle is inserted into the port.  Only "non-coring" port needles should be used to access the port. Once the port is accessed, a blood return should be checked. This helps ensure that the port  is in the vein and is not clogged.  If your port needs to remain accessed for a constant infusion, a clear (transparent) bandage will be placed over the needle site. The bandage and needle will need to be changed every week, or as directed by your health care provider.  Keep the bandage covering the needle clean and dry. Do not get it wet. Follow your health care provider's instructions on how to take a shower or bath while the port is accessed.  If your port does not need to stay accessed, no bandage is needed over the port.  What is flushing? Flushing helps keep the port from getting clogged. Follow your health care provider's instructions on how and when to flush the port. Ports are usually flushed with saline solution or a medicine called heparin. The need for flushing will depend on how the port is used.  If the port is used for intermittent medicines or blood draws, the port will need to be flushed: ? After medicines have been given. ? After blood has been drawn. ? As part of routine maintenance.  If a constant infusion is running, the port may not need to be flushed.  How long will my port stay implanted? The port can stay in for as long as your health care provider thinks it is needed. When it is time for the port to come out, surgery will be   done to remove it. The procedure is similar to the one performed when the port was put in. When should I seek immediate medical care? When you have an implanted port, you should seek immediate medical care if:  You notice a bad smell coming from the incision site.  You have swelling, redness, or drainage at the incision site.  You have more swelling or pain at the port site or the surrounding area.  You have a fever that is not controlled with medicine.  This information is not intended to replace advice given to you by your health care provider. Make sure you discuss any questions you have with your health care provider. Document  Released: 10/20/2005 Document Revised: 03/27/2016 Document Reviewed: 06/27/2013 Elsevier Interactive Patient Education  2017 [employer]  

## 2017-07-07 NOTE — Patient Instructions (Signed)
[facility] Discharge Instructions for Patients Receiving Chemotherapy  Today you received the following chemotherapy agents: Avastin  To help prevent nausea and vomiting after your treatment, we encourage you to take your nausea medication. If you develop nausea and vomiting that is not controlled by your nausea medication, call the clinic.   BELOW ARE SYMPTOMS THAT SHOULD BE REPORTED IMMEDIATELY:  *FEVER GREATER THAN 100.5 F  *CHILLS WITH OR WITHOUT FEVER  NAUSEA AND VOMITING THAT IS NOT CONTROLLED WITH YOUR NAUSEA MEDICATION  *UNUSUAL SHORTNESS OF BREATH  *UNUSUAL BRUISING OR BLEEDING  TENDERNESS IN MOUTH AND THROAT WITH OR WITHOUT PRESENCE OF ULCERS  *URINARY PROBLEMS  *BOWEL PROBLEMS  UNUSUAL RASH Items with * indicate a potential emergency and should be followed up as soon as possible.  Feel free to call the clinic you have any questions or concerns. The clinic phone number is [phone].  Please show the CHEMO ALERT CARD at check-in to the Emergency Department and triage nurse.   

## 2017-07-20 ENCOUNTER — Ambulatory Visit (HOSPITAL_COMMUNITY)
Admission: RE | Admit: 2017-07-20 | Discharge: 2017-07-20 | Disposition: A | Discharge status: Home / Self Care | Payer: BC Managed Care – PPO | Source: Ambulatory Visit | Attending: Hematology and Oncology | Admitting: Hematology and Oncology

## 2017-07-20 ENCOUNTER — Telehealth: Payer: Self-pay

## 2017-07-20 DIAGNOSIS — R918 Other nonspecific abnormal finding of lung field: Secondary | ICD-10-CM | POA: Diagnosis not present

## 2017-07-20 DIAGNOSIS — C481 Malignant neoplasm of specified parts of peritoneum: Secondary | ICD-10-CM | POA: Insufficient documentation

## 2017-07-20 DIAGNOSIS — R188 Other ascites: Secondary | ICD-10-CM | POA: Insufficient documentation

## 2017-07-20 MED ORDER — IOPAMIDOL (ISOVUE-300) INJECTION 61%
INTRAVENOUS | Status: AC
  Filled 2017-07-20: qty 100

## 2017-07-20 MED ORDER — HEPARIN SOD (PORK) LOCK FLUSH 100 UNIT/ML IV SOLN
INTRAVENOUS | Status: AC
  Filled 2017-07-20: qty 5

## 2017-07-20 MED ORDER — HEPARIN SOD (PORK) LOCK FLUSH 100 UNIT/ML IV SOLN
500.0000 [IU] | Freq: Once | INTRAVENOUS | Status: AC
  Administered 2017-07-20: 500 [IU] via INTRAVENOUS

## 2017-07-20 MED ORDER — IOPAMIDOL (ISOVUE-300) INJECTION 61%
100.0000 mL | Freq: Once | INTRAVENOUS | Status: AC | PRN
  Administered 2017-07-20: 100 mL via INTRAVENOUS

## 2017-07-20 NOTE — Telephone Encounter (Signed)
Called patient to see if she could come in at 8:30 am tomorrow to see Dr. Alvy Bimler only. Ask her to call us back.

## 2017-07-20 NOTE — Telephone Encounter (Signed)
Called patient again and she will come in tomorrow at 0830 to see Dr. Alvy Bimler,

## 2017-07-21 ENCOUNTER — Telehealth: Payer: Self-pay

## 2017-07-21 ENCOUNTER — Ambulatory Visit (HOSPITAL_BASED_OUTPATIENT_CLINIC_OR_DEPARTMENT_OTHER): Payer: BC Managed Care – PPO | Admitting: Hematology and Oncology

## 2017-07-21 ENCOUNTER — Encounter: Payer: Self-pay | Admitting: Hematology and Oncology

## 2017-07-21 ENCOUNTER — Telehealth: Payer: Self-pay | Admitting: Hematology and Oncology

## 2017-07-21 VITALS — BP 157/70 | HR 94 | Temp 98.2°F | Resp 18 | Ht 62.0 in | Wt 214.3 lb

## 2017-07-21 DIAGNOSIS — T451X5A Adverse effect of antineoplastic and immunosuppressive drugs, initial encounter: Secondary | ICD-10-CM

## 2017-07-21 DIAGNOSIS — D61810 Antineoplastic chemotherapy induced pancytopenia: Secondary | ICD-10-CM

## 2017-07-21 DIAGNOSIS — Z7189 Other specified counseling: Secondary | ICD-10-CM | POA: Diagnosis not present

## 2017-07-21 DIAGNOSIS — C786 Secondary malignant neoplasm of retroperitoneum and peritoneum: Secondary | ICD-10-CM | POA: Diagnosis not present

## 2017-07-21 DIAGNOSIS — C801 Malignant (primary) neoplasm, unspecified: Secondary | ICD-10-CM

## 2017-07-21 DIAGNOSIS — C481 Malignant neoplasm of specified parts of peritoneum: Secondary | ICD-10-CM

## 2017-07-21 NOTE — Progress Notes (Signed)
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 This may potentially be ovarian or a GI in etiology.      04/30/2016 Tumor Marker    CA 125- 7149.0 (H)      05/01/2016 Procedure    US paracentesis- Successful ultrasound-guided paracentesis yielding 1.8 liters of peritoneal fluid.      05/01/2016 Imaging    US pelvis- Both transabdominal and transvaginal sonography are significantly limited by large patient habitus and ascites. Neither uterus or ovaries were visualized on this exam.      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      05/08/2016 Imaging    CT chest- No evidence of metastatic disease in the chest. Peritoneal/omental disease with abdominal ascites in the upper abdomen, incompletely visualized.       05/13/2016 Procedure    Placement of single lumen port a cath via right internal jugular vein. The catheter tip lies at the cavoatrial junction. A power injectable port a cath was placed and is ready for immediate use.      05/15/2016 Procedure    US Paracentesis- 3400 ml yellow colored ascites removed      05/15/2016 - 09/18/2016 Chemotherapy    Carboplatin/Paclitaxel every 21 days x 7 cycles      07/01/2016 Miscellaneous    Genetic Counseling by [name]-  Genetic testing was normal, and did not reveal a deleterious mutation in these genes.       07/08/2016 Imaging    CT CAP- 1. Small volume ascites, significantly  decreased. 2. Stable diffuse omental soft tissue caking and diffuse peritoneal thickening along the bilateral paracolic gutters and bilateral pelvic peritoneal reflections, consistent with peritoneal carcinomatosis. 3. Stable asymmetrically enlarged right ovary, which may represent the primary site of ovarian malignancy. 4. No evidence of metastatic disease in the chest. No new sites of metastatic disease in the abdomen or pelvis.      07/09/2016 Miscellaneous    Gyn Onc re-evaluation- modest response to therapy, 3 more cycles of chemotherapy recommended.        09/11/2016 Imaging    CT C/A/P No significant change omental soft tissue caking, consistent with metastatic disease. Mild ascites is decreased since previous study.  Increased calcification along peritoneal surface in pelvic cul-de-sac, consistent with treated peritoneal metastatic disease.  Stable 4.5cm homogeneous right pelvic mass, which favors a uterine fibroid although right ovarian neoplasm cannot definitely be excluded.  No new or progressive metastatic disease identified. No evidence of metastatic disease within the thorax.       10/14/2016 Procedure    Robotic-assisted laparoscopic total hysterectomy with bilateral salpingoophorectomy, ex lap omentectomy, radical tumor debulking by Dr. Denman George      10/17/2016 Pathology Results    Diagnosis 1. 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 - CERVIX AND ENDOMETRIUM FREE OF TUMOR. - SEE ONCOLOGY TABLE AND COMMENT. 2. Soft tissue, biopsy, umbilical nodule - HIGH GRADE SEROUS CARCINOMA. 3.  Omentum, resection for tumor - HIGH GRADE SEROUS CARCINOMA, 33 CM.      11/06/2016 - 12/25/2016 Chemotherapy    Carboplatin/Paclitaxel x 3 cycles       01/12/2017 Imaging    CT CAP- 1. Interval hysterectomy, bilateral salpingo-oophorectomy and omentectomy without evidence of  tumor recurrence. 2. No evidence of metastatic disease. 3. 5 mm nonobstructing lower pole left renal calculus.      01/13/2017 Remission    No evidence of residual disease on CT imaging.      05/04/2017 Imaging    CT CAP- New small amount of ascites within the abdomen and pelvis since 01/12/2017 which could indicate disease progression but no new identifiable tumor and no significant change in omental and mild peritoneal thickening.  No evidence of metastatic disease within the chest.  Coronary artery disease.  Aortic Atherosclerosis (ICD10-I70.0).      05/04/2017 Tumor Marker    Patient's tumor was tested for the following markers: CA 125 Results of the tumor marker test revealed 0932      05/18/2017 Imaging    ECHO; EF 60% -  65      05/25/2017 - 07/07/2017 Chemotherapy    She received Doxil and Avastin. Treatment is stopped due to disease progression      06/01/2017 Procedure    Successful ultrasound-guided therapeutic paracentesis yielding 2.8 liters of peritoneal fluid      06/22/2017 Tumor Marker    Patient's tumor was tested for the following markers: CA 125 Results of the tumor marker test revealed 13440      07/20/2017 Imaging    Mild increase in peritoneal carcinoma within abdomen pelvis since previous study. No significant change and minimal ascites.  No evidence of metastatic disease within the thorax. New mild airspace opacity in left lower lobe, consistent with inflammatory or infectious etiology.       INTERVAL HISTORY: Please see below for problem oriented charting. She returns with her husband for further follow-up The patient complained of fatigue with recent treatment No nausea, cough, chest pain or shortness of breath Denies abdominal bloating or changes in bowel habits  REVIEW OF SYSTEMS:   Constitutional: Denies fevers, chills or abnormal weight loss Eyes: Denies blurriness of vision Ears, nose, mouth, throat, and face: Denies mucositis or sore  throat Respiratory: Denies cough, dyspnea or wheezes Cardiovascular: Denies palpitation, chest discomfort or lower extremity swelling Gastrointestinal:  Denies nausea, heartburn or change in bowel habits Skin: Denies abnormal skin rashes Lymphatics: Denies new lymphadenopathy or easy bruising Neurological:Denies numbness, tingling or new weaknesses Behavioral/Psych: Mood is stable, no new changes  All other systems were reviewed with the patient and are negative.  I have reviewed the past medical history, past surgical history, social history and family history with the patient and they are unchanged from previous note.  ALLERGIES:  is allergic to avandia [rosiglitazone]; micronase [glyburide]; and actos [pioglitazone].  MEDICATIONS:  Current Outpatient Prescriptions  Medication Sig Dispense Refill  . atorvastatin (LIPITOR) 40 MG tablet Take 1 tablet (40 mg total) by mouth daily. 90 tablet 1  . benazepril (LOTENSIN) 10 MG tablet Take 1 tablet (10 mg total) by mouth daily. 90 tablet 1  . diazepam (VALIUM) 5 MG tablet Take 1 tablet (5 mg total) by mouth every 4 (four) hours as needed (spasms). 60 tablet 1  . furosemide (LASIX) 20 MG tablet Take 1 tablet (20 mg total) by mouth 2 (two) times daily. 90 tablet 1  . glucosamine-chondroitin 500-400 MG tablet Take 1 tablet  by mouth 2 (two) times daily.    Marland Kitchen HYDROcodone-acetaminophen (NORCO) 10-325 MG tablet Take 1 tablet by mouth every 4 (four) hours as needed. 90 tablet 0  . lidocaine-prilocaine (EMLA) cream Apply a quarter size amount to port site 1 hour prior to chemo. Do not rub in. Cover with plastic wrap. 30 g 3  . metFORMIN (GLUCOPHAGE) 1000 MG tablet TAKE 1 TABLET BY MOUTH TWICE DAILY WITH A MEAL 180 tablet 1  . Multiple Vitamin (MULTIVITAMIN WITH MINERALS) TABS Take 1 tablet by mouth daily.    . multivitamin-lutein (OCUVITE-LUTEIN) CAPS capsule Take 1 capsule by mouth daily.    Marland Kitchen NOVOLOG 100 UNIT/ML injection USE IN INSULIN PUMP AS  DIRECTED. MAX DAILY DOSE OF 110 UNITS PER DAY 120 mL 0  . omeprazole (PRILOSEC) 40 MG capsule     . ondansetron (ZOFRAN) 8 MG tablet Take by mouth every 8 (eight) hours as needed for nausea or vomiting.    . ONE TOUCH ULTRA TEST test strip USE TO CHECK BLOOD SUGAR UP TO 5 TIMES A DAY 450 each 2  . polyethylene glycol (MIRALAX / GLYCOLAX) packet Take 17 g by mouth daily.    Marland Kitchen senna (SENOKOT) 8.6 MG TABS tablet Take 1 tablet (8.6 mg total) by mouth at bedtime as needed for moderate constipation. 120 each 0  . vitamin B-12 (CYANOCOBALAMIN) 1000 MCG tablet Take 1,000 mcg by mouth daily.    . vitamin E (VITAMIN E) 400 UNIT capsule Take 400 Units by mouth daily.     No current facility-administered medications for this visit.     PHYSICAL EXAMINATION: ECOG PERFORMANCE STATUS: 1 - Symptomatic but completely ambulatory  Vitals:   07/21/17 0838  BP: (!) 157/70  Pulse: 94  Resp: 18  Temp: 98.2 F (36.8 C)  SpO2: 93%   Filed Weights   07/21/17 0838  Weight: 214 lb 4.8 oz (97.2 kg)    GENERAL:alert, no distress and comfortable SKIN: skin color, texture, turgor are normal, no rashes or significant lesions EYES: normal, Conjunctiva are pink and non-injected, sclera clear Musculoskeletal:no cyanosis of digits and no clubbing  NEURO: alert & oriented x 3 with fluent speech, no focal motor/sensory deficits  LABORATORY DATA:  I have reviewed the data as listed    Component Value Date/Time   NA 142 07/07/2017 1316   K 4.7 07/07/2017 1316   CL 105 04/27/2017 1003   CO2 23 07/07/2017 1316   GLUCOSE 109 07/07/2017 1316   BUN 32.5 (H) 07/07/2017 1316   CREATININE 1.2 (H) 07/07/2017 1316   CALCIUM 9.8 07/07/2017 1316   PROT 7.6 07/07/2017 1316   ALBUMIN 3.2 (L) 07/07/2017 1316   AST 26 07/07/2017 1316   ALT 21 07/07/2017 1316   ALKPHOS 123 07/07/2017 1316   BILITOT 0.37 07/07/2017 1316   GFRNONAA >60 04/27/2017 1003   GFRAA >60 04/27/2017 1003    No results found for: SPEP,  UPEP  Lab Results  Component Value Date   WBC 4.0 07/07/2017   NEUTROABS 3.2 07/07/2017   HGB 8.3 (L) 07/07/2017   HCT 26.9 (L) 07/07/2017   MCV 97.1 07/07/2017   PLT 176 07/07/2017      Chemistry      Component Value Date/Time   NA 142 07/07/2017 1316   K 4.7 07/07/2017 1316   CL 105 04/27/2017 1003   CO2 23 07/07/2017 1316   BUN 32.5 (H) 07/07/2017 1316   CREATININE 1.2 (H) 07/07/2017 1316      Component Value  Date/Time   CALCIUM 9.8 07/07/2017 1316   ALKPHOS 123 07/07/2017 1316   AST 26 07/07/2017 1316   ALT 21 07/07/2017 1316   BILITOT 0.37 07/07/2017 1316       RADIOGRAPHIC STUDIES: I have reviewed imaging study with the patient and her husband I have personally reviewed the radiological images as listed and agreed with the findings in the report. Ct Chest W Contrast  Result Date: 07/20/2017 CLINICAL DATA:  Followup ovarian and peritoneal carcinoma. Undergoing chemotherapy. Restaging. EXAM: CT CHEST, ABDOMEN, AND PELVIS WITH CONTRAST TECHNIQUE: Multidetector CT imaging of the chest, abdomen and pelvis was performed following the standard protocol during bolus administration of intravenous contrast. CONTRAST:  168mL ISOVUE-300 IOPAMIDOL (ISOVUE-300) INJECTION 61% COMPARISON:  05/04/2017 FINDINGS: CT CHEST FINDINGS Cardiovascular: No acute findings. Mediastinum/Lymph Nodes: Stable tiny sub-cm thyroid nodules. No masses or pathologically enlarged lymph nodes identified. Lungs/Pleura: Mild ill-defined area of airspace opacity seen in the superior left lower lobe which is new since previous study, and consistent with inflammatory or infectious etiology. No suspicious pulmonary nodules identified. No evidence of pleural effusion. Musculoskeletal:  No suspicious bone lesions identified. CT ABDOMEN AND PELVIS FINDINGS Hepatobiliary: No masses identified. Gallbladder is unremarkable. Pancreas:  No mass or inflammatory changes. Spleen:  Within normal limits in size and appearance.  Adrenals/Urinary tract: No masses or hydronephrosis. 2-3 mm nonobstructing calculus again seen in lower pole of left kidney. Stomach/Bowel:  No evidence of bowel obstruction. Vascular/Lymphatic: No pathologically enlarged lymph nodes identified. No abdominal aortic aneurysm. Aortic atherosclerosis. Reproductive: Prior hysterectomy noted. Mild increase in peritoneal soft tissue thickening seen in central pelvis. Mild increase in soft tissue densities seen within the omental fat mainly in the left abdomen. New peritoneal soft tissue density seen in the gastrohepatic ligament, measuring 3.9 x 3.0 cm on image 54/2. Minimal ascites shows no significant change. Other:  None. Musculoskeletal:  No suspicious bone lesions identified. IMPRESSION: Mild increase in peritoneal carcinoma within abdomen pelvis since previous study. No significant change and minimal ascites. No evidence of metastatic disease within the thorax. New mild airspace opacity in left lower lobe, consistent with inflammatory or infectious etiology. Electronically Signed   By: Earle Gell M.D.   On: 07/20/2017 11:46   Ct Abdomen Pelvis W Contrast  Result Date: 07/20/2017 CLINICAL DATA:  Followup ovarian and peritoneal carcinoma. Undergoing chemotherapy. Restaging. EXAM: CT CHEST, ABDOMEN, AND PELVIS WITH CONTRAST TECHNIQUE: Multidetector CT imaging of the chest, abdomen and pelvis was performed following the standard protocol during bolus administration of intravenous contrast. CONTRAST:  13mL ISOVUE-300 IOPAMIDOL (ISOVUE-300) INJECTION 61% COMPARISON:  05/04/2017 FINDINGS: CT CHEST FINDINGS Cardiovascular: No acute findings. Mediastinum/Lymph Nodes: Stable tiny sub-cm thyroid nodules. No masses or pathologically enlarged lymph nodes identified. Lungs/Pleura: Mild ill-defined area of airspace opacity seen in the superior left lower lobe which is new since previous study, and consistent with inflammatory or infectious etiology. No suspicious pulmonary  nodules identified. No evidence of pleural effusion. Musculoskeletal:  No suspicious bone lesions identified. CT ABDOMEN AND PELVIS FINDINGS Hepatobiliary: No masses identified. Gallbladder is unremarkable. Pancreas:  No mass or inflammatory changes. Spleen:  Within normal limits in size and appearance. Adrenals/Urinary tract: No masses or hydronephrosis. 2-3 mm nonobstructing calculus again seen in lower pole of left kidney. Stomach/Bowel:  No evidence of bowel obstruction. Vascular/Lymphatic: No pathologically enlarged lymph nodes identified. No abdominal aortic aneurysm. Aortic atherosclerosis. Reproductive: Prior hysterectomy noted. Mild increase in peritoneal soft tissue thickening seen in central pelvis. Mild increase in soft tissue densities seen within  the omental fat mainly in the left abdomen. New peritoneal soft tissue density seen in the gastrohepatic ligament, measuring 3.9 x 3.0 cm on image 54/2. Minimal ascites shows no significant change. Other:  None. Musculoskeletal:  No suspicious bone lesions identified. IMPRESSION: Mild increase in peritoneal carcinoma within abdomen pelvis since previous study. No significant change and minimal ascites. No evidence of metastatic disease within the thorax. New mild airspace opacity in left lower lobe, consistent with inflammatory or infectious etiology. Electronically Signed   By: Earle Gell M.D.   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 within 6 months of stopping carboplatin The tumor markers are significantly elevated The patient is relatively asymptomatic I have a long discussion with the patient and her husband I have also reviewed her case with her GYN oncologist We discussed treatment options in accordance to current guidelines After a very  prolonged extensive discussion, the patient ultimately made informed decision to proceed with single agent gemcitabine alone with goes to preserve quality of life as much as possible We also discussed prognosis and palliative care/hospice if the patient declined treatment We discussed the role of chemotherapy. The intent is of palliative intent.  I shared with her publication below:  Randomized Phase III Trial of Gemcitabine Compared With Pegylated Liposomal Doxorubicin in Patients With Platinum-Resistant Ovarian Cancer Lorenso Quarry. Arlyss Queen [place], Santiago Glad Teneriello, Willette [name], Scott D. McMeekin, Meredeth Ide, Veleta Miners, [facility], [place]. Evonnie Pat, and [employer] [place] A B S T R A C T Purpose Ovarian cancer (OC) patients experiencing progressive disease (PD) within 6 months of platinumbased therapy in the primary setting are considered platinum resistant (Pt-R). Currently, pegylated liposomal doxorubicin (PLD) is a standard of care for treatment of recurrent Pt-R disease. On the basis of promising phase II results, gemcitabine was compared with PLD for efficacy and safety in taxane-pretreated Pt-R OC patients. Patients and Methods Patients (n  195) with Pt-R OC were randomly assigned to either gemcitabine 1,000 mg/m2 (days 1 and 8; every 21 days) or PLD 50 mg/m2 (day 1; every 28 days) until PD or undue toxicity. Optional cross-over therapy was allowed at PD or at withdrawal because of toxicity. Primary end point was progression-free survival (PFS). Additional end points included tumor response, time to treatment failure, survival, and quality of life. Results In the gemcitabine and PLD groups, median PFS was 3.6 v 3.1 months; median overall survival was 12.7 v 13.5 months; overall response rate (ORR) was 6.1% v 8.3%; and in the subset of patients with measurable disease, ORR was 9.2% v 11.7%, respectively. None of the efficacy end points  showed a statistically significant difference between treatment groups. The PLD group experienced significantly more hand-foot syndrome and mucositis; the gemcitabine group experienced significantly more constipation, nausea/vomiting, fatigue, and neutropenia but not febrile neutropenia. Conclusion Although this was not designed as an equivalency study, gemcitabine and PLD seem to have a comparable therapeutic index in this population of Pt-R taxane-pretreated OC patients. Single agent gemcitabine may be an acceptable alternative to PLD for patients with platinum resistant ovarian cancer J Clin Oncol 25:2811-2818.  2007 by American Society of Clinical Oncology  We discussed, risks, benefits, side effects of treatment and she agreed to proceed with the plan of care I will schedule her to start treatment next week  I will see her prior to day 8 of treatment I recommend a minimum 3 months of treatment before repeat imaging   Antineoplastic chemotherapy induced pancytopenia ([place]) She has recent significant acquired pancytopenia secondary to treatment I will order additional workup for anemia but I suspect is all related to side effects of treatment Currently, apart from fatigue, she is not symptomatic She does not require blood transfusion  Goals of care, counseling/discussion The patient is aware she has likely incurable disease and treatment is strictly palliative. We discussed importance of Advanced Directives and Living will. We discussed prognosis with or without chemotherapy At this point in time, the patient does not require palliative care consult   Orders Placed This Encounter  Procedures  . Comprehensive metabolic panel    Standing Status:   Standing    Number of Occurrences:   22    Standing Expiration Date:   07/21/2018  . CBC with Differential/Platelet    Standing Status:   Standing    Number of Occurrences:   22    Standing Expiration Date:   07/21/2018   All questions  were answered. The patient knows to call the clinic with any problems, questions or concerns. No barriers to learning was detected. I spent 60 minutes counseling the patient face to face. The total time spent in the appointment was 80 minutes and more than 50% was on counseling and review of test results     Heath Lark, MD 07/21/2017 1:36 PM

## 2017-07-21 NOTE — Assessment & Plan Note (Signed)
The patient is aware she has likely incurable disease and treatment is strictly palliative. We discussed importance of Advanced Directives and Living will. We discussed prognosis with or without chemotherapy At this point in time, the patient does not require palliative care consult

## 2017-07-21 NOTE — Assessment & Plan Note (Signed)
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 within 6 months of stopping carboplatin The tumor markers are significantly elevated The patient is relatively asymptomatic I have a long discussion with the patient and her husband I have also reviewed her case with her GYN oncologist We discussed treatment options in accordance to current guidelines After a very prolonged extensive discussion, the patient ultimately made informed decision to proceed with single agent gemcitabine alone with goes to preserve quality of life as much as possible We also discussed prognosis and palliative care/hospice if the patient declined treatment We discussed the role of chemotherapy. The intent is of palliative intent.  I shared with her publication below:  Randomized Phase III Trial of Gemcitabine Compared With Pegylated Liposomal Doxorubicin in Patients With Platinum-Resistant Ovarian Cancer Lorenso Quarry. Arlyss Queen [place], Santiago Glad T[place], Willette [name], Scott D. McMeekin, Meredeth Ide, Veleta Miners, [facility], [place]. Evonnie Pat, and [employer] [place] A B S T R A C T Purpose Ovarian cancer (OC) patients experiencing progressive disease (PD) within 6 months of platinumbased therapy in the primary setting are considered platinum resistant (Pt-R). Currently, pegylated liposomal doxorubicin (PLD) is a standard of care for treatment of recurrent Pt-R disease. On the basis of promising phase II results, gemcitabine was compared with PLD for efficacy and safety in taxane-pretreated Pt-R OC patients. Patients and Methods Patients (n  195) with Pt-R OC were randomly assigned to either gemcitabine 1,000 mg/m2 (days 1 and 8; every 21 days) or PLD 50 mg/m2 (day 1; every 28 days) until PD or undue toxicity. Optional cross-over therapy was allowed at PD or at withdrawal because  of toxicity. Primary end point was progression-free survival (PFS). Additional end points included tumor response, time to treatment failure, survival, and quality of life. Results In the gemcitabine and PLD groups, median PFS was 3.6 v 3.1 months; median overall survival was 12.7 v 13.5 months; overall response rate (ORR) was 6.1% v 8.3%; and in the subset of patients with measurable disease, ORR was 9.2% v 11.7%, respectively. None of the efficacy end points showed a statistically significant difference between treatment groups. The PLD group experienced significantly more hand-foot syndrome and mucositis; the gemcitabine group experienced significantly more constipation, nausea/vomiting, fatigue, and neutropenia but not febrile neutropenia. Conclusion Although this was not designed as an equivalency study, gemcitabine and PLD seem to have a comparable therapeutic index in this population of Pt-R taxane-pretreated OC patients. Single agent gemcitabine may be an acceptable alternative to PLD for patients with platinum resistant ovarian cancer J Clin Oncol 25:2811-2818.  2007 by American Society of Clinical Oncology  We discussed, risks, benefits, side effects of treatment and she agreed to proceed with the plan of care I will schedule her to start treatment next week I will see her prior to day 8 of treatment I recommend a minimum 3 months of treatment before repeat imaging

## 2017-07-21 NOTE — Progress Notes (Signed)
DISCONTINUE ON PATHWAY REGIMEN - Ovarian     A cycle is every 28 days:     Liposomal doxorubicin      Bevacizumab   **Always confirm dose/schedule in your pharmacy ordering system**    REASON: Disease Progression PRIOR TREATMENT: [id number]: Liposomal Doxorubicin (Doxil) 40 mg/m2 D1 + Bevacizumab 10 mg/kg D1, 15 q28 Days; Stop Treatment After 6 Cycles If Complete Response, Otherwise Continue Treatment Until Progression or Unacceptable Toxicity TREATMENT RESPONSE: Progressive Disease (PD)  START OFF PATHWAY REGIMEN - Ovarian   OFF00015:Gemcitabine 1,000 mg/m2 Days 1, 8, 15 q28 Days:   A cycle is every 28 days (3 weeks on and 1 week off):     Gemcitabine   **Always confirm dose/schedule in your pharmacy ordering system**    Patient Characteristics: Recurrent or Progressive Disease, Third Line Therapy, Platinum Resistant or < 6 Months Since Last Platinum Therapy AJCC T Category: T3 AJCC N Category: N0 AJCC M Category: M1 Therapeutic Status: Recurrent or Progressive Disease AJCC 8 Stage Grouping: IV Line of Therapy: Third Line BRCA Mutation Status: Absent Would you be surprised if this patient died  in the next year<= I would be surprised if this patient died in the next year Intent of Therapy: Non-Curative / Palliative Intent, Discussed with Patient

## 2017-07-21 NOTE — Assessment & Plan Note (Signed)
She has recent significant acquired pancytopenia secondary to treatment I will order additional workup for anemia but I suspect is all related to side effects of treatment Currently, apart from fatigue, she is not symptomatic She does not require blood transfusion

## 2017-07-21 NOTE — Telephone Encounter (Signed)
Patients husband called to talk with Dr. Alvy Bimler regarding his wife's appt today. He would like to discuss the treatment plan.

## 2017-07-21 NOTE — Telephone Encounter (Signed)
Left message for patient regarding the changes in her schedule per 9/18 sch msg. Sending her a confirmation letter in the mail.

## 2017-07-22 ENCOUNTER — Ambulatory Visit: Payer: BC Managed Care – PPO

## 2017-07-22 ENCOUNTER — Ambulatory Visit: Payer: BC Managed Care – PPO | Admitting: Hematology and Oncology

## 2017-07-22 ENCOUNTER — Other Ambulatory Visit: Payer: BC Managed Care – PPO

## 2017-07-24 ENCOUNTER — Encounter: Payer: Self-pay | Admitting: Gynecologic Oncology

## 2017-07-24 ENCOUNTER — Ambulatory Visit: Payer: BC Managed Care – PPO | Attending: Gynecologic Oncology | Admitting: Gynecologic Oncology

## 2017-07-24 VITALS — BP 142/69 | HR 86 | Temp 97.5°F | Resp 20 | Wt 210.5 lb

## 2017-07-24 DIAGNOSIS — K219 Gastro-esophageal reflux disease without esophagitis: Secondary | ICD-10-CM | POA: Insufficient documentation

## 2017-07-24 DIAGNOSIS — C5700 Malignant neoplasm of unspecified fallopian tube: Secondary | ICD-10-CM | POA: Diagnosis not present

## 2017-07-24 DIAGNOSIS — Z8249 Family history of ischemic heart disease and other diseases of the circulatory system: Secondary | ICD-10-CM | POA: Insufficient documentation

## 2017-07-24 DIAGNOSIS — Z803 Family history of malignant neoplasm of breast: Secondary | ICD-10-CM | POA: Diagnosis not present

## 2017-07-24 DIAGNOSIS — Z801 Family history of malignant neoplasm of trachea, bronchus and lung: Secondary | ICD-10-CM | POA: Diagnosis not present

## 2017-07-24 DIAGNOSIS — Z794 Long term (current) use of insulin: Secondary | ICD-10-CM | POA: Diagnosis not present

## 2017-07-24 DIAGNOSIS — C569 Malignant neoplasm of unspecified ovary: Secondary | ICD-10-CM | POA: Insufficient documentation

## 2017-07-24 DIAGNOSIS — Z806 Family history of leukemia: Secondary | ICD-10-CM | POA: Insufficient documentation

## 2017-07-24 DIAGNOSIS — E119 Type 2 diabetes mellitus without complications: Secondary | ICD-10-CM | POA: Insufficient documentation

## 2017-07-24 DIAGNOSIS — D649 Anemia, unspecified: Secondary | ICD-10-CM | POA: Diagnosis not present

## 2017-07-24 DIAGNOSIS — Z87440 Personal history of urinary (tract) infections: Secondary | ICD-10-CM | POA: Insufficient documentation

## 2017-07-24 DIAGNOSIS — Z833 Family history of diabetes mellitus: Secondary | ICD-10-CM | POA: Diagnosis not present

## 2017-07-24 DIAGNOSIS — Z79891 Long term (current) use of opiate analgesic: Secondary | ICD-10-CM | POA: Insufficient documentation

## 2017-07-24 DIAGNOSIS — Z9221 Personal history of antineoplastic chemotherapy: Secondary | ICD-10-CM | POA: Diagnosis not present

## 2017-07-24 DIAGNOSIS — Z888 Allergy status to other drugs, medicaments and biological substances status: Secondary | ICD-10-CM | POA: Insufficient documentation

## 2017-07-24 DIAGNOSIS — R188 Other ascites: Secondary | ICD-10-CM | POA: Insufficient documentation

## 2017-07-24 DIAGNOSIS — Z9889 Other specified postprocedural states: Secondary | ICD-10-CM | POA: Insufficient documentation

## 2017-07-24 DIAGNOSIS — Z90710 Acquired absence of both cervix and uterus: Secondary | ICD-10-CM | POA: Diagnosis not present

## 2017-07-24 NOTE — Patient Instructions (Signed)
Please call for any needs

## 2017-07-24 NOTE — Progress Notes (Signed)
Follow-up Note: Gyn-Onc  Consult was requested by Dr. Whitney Muse for the evaluation of Natika Geyer 66 y.o. female  CC:  Chief Complaint  Patient presents with  . Ovarian cancer, unspecified laterality [facility])    Assessment/Plan:  Ms. Kalika Smay  is a 66 y.o.  year old with stage IIIC ovarian/primary peritoneal/fallopian tube cancer with slow but progressive response to neoadjuvant chemotherapy with carboplatin and paclitaxel.  S/p 7 cycles adjuvant chemotherapy with carb/taxol. S/p interval optimal cytoreduction with hysterectomy, BSO, omentectomy on 10/14/16 with R1 residual disease. S/p 3 additional cycles of adjuvant chemotherapy with partial response. BRCA negative.  Recurrence within 3 months of completing therapy. Progression on Doxil and Avastin. Excellent performance status (ECOG 0)  She has been told that her disease is likely not curable given the platinum resistant nature of it, however, her performance status is excellent and despite her diabetes I believe she is a candidate for palliative intent, salvage chemotherapy.  I agree with single agent Gemzaar as a tolerable agent in this patient and a balance between aggressive therapy and palliative care.  We again discussed prognosis and course for the future. Despite hearing this, I believe it may be difficult for Ladiamond to accept progression and discontinuation of therapy when it happens.  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  She has a history of feeling bloated and extended since June 1st, 2017. She was seen in the ED on 04/30/16 and a CT scan of the abdomen and pelvis was performed it revealed moderate volume ascites, masslike expansion of the uterine fundus, no adnexal masses seen. There was extensive omental caking compatible with peritoneal metastatic disease. No pathologic  adenopathy was identified. 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 The patient was seen and evaluated by Dr. Whitney Muse with a plan in place for chemotherapy when appropriate.   She is morbidly obese with a weight of 247lbs. She has poorly controlled DM (last HbA1C was >9%) though she has more recently been on tighter control of her glucose with metformin and insulin.  She was diagnosed with shingles above her left eye immediately prior to starting chemotherapy. Between 05/15/16 (day 1 cycle 1) and 06/26/16 (day 1 cycle 3) she has received 3 doses of neoadjuvant carboplatin and paclitaxel. Her CA 125 was 7149 on 05/01/16 and increased to 11,113 on 05/16/16 on day of chemotherapy initiation. It was increased to 12,359 prior to cycle 2 and decreased to 8,811 for day 1 of cycle 3.  Repeat CT scan after 3 cycles showed minimal improvement. Given her modest CA 125 and CT response to 3 cycles of chemotherapy, she was felt to not be a good candidate for interval debulking at that time.  She received an additional 3 cycles of carboplatin and paclitaxel (last dose on 08/28/16). She has been tolerating chemotherapy well though does have issues with anemia and has required 2 blood transfusions. CA 125 on day 1 of cycle 6 (08/28/16) was 1,835 (which is a steady reduction over the past 3 cycles).  CT imaging on 09/11/16 showed No significant change omental soft tissue caking, consistent with metastatic disease. Mild ascites is decreased since previous study. Increased calcification along peritoneal surface in pelvic cul-de-sac, consistent with treated peritoneal metastatic disease. Stable 4.5cm homogeneous right pelvic mass, which favors a uterine fibroid although  right ovarian neoplasm cannot definitely be excluded.  No new or progressive metastatic disease identified. No evidence of metastatic disease  within the thorax.Marland Kitchen  She went on to complete 7 cycles of adjuvant chemotherapy with carboplatin and paclitaxel (last dose on 09/18/16) with CA 125 1445.  On 10/14/16 she underwent robotic assisted total hysterectomy, BSO, minilaparotomy for omentectomy, radical tumor debulking to R1 residual disease (milliary disease throughout peritoneal cavity).  Surgical findings were significant for bulky omental caking, small volume disease in pelvis.  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  Postoperatively she did very well with no issues or infection.  She went on to receive 3 additional cycles of carboplatin and paclitaxel (total 10). She tolerated the therapy fairly well. Her CA 125 plateau'd at approximately 100 at the completion of therapy. Post treatment CT scan on 01/16/17 showed no measurable disease on imaging despite the persistently mild elevation in CA 125.  Her treatment was discontinued.   Her CA 125 was rechecked on 04/27/17 at the time of a port flush and was very elevated at 1633 confirming progression.  CT abdo/pelvis and chest on 05/04/17 showed small volume perihepatic ascites and increased nodularity in the peritoneum and omentum but no dominant mass.   Interval History:  She went on to receive Doxil with Avastin between 05/25/17 until 07/07/17. CA 125 rose to 13440 on 06/22/17 suggesting progression. CT abdo/pelvis on 07/20/17 showed mild increase in peritoneal carcinomatosis with new lesion in the gastrocolic ligament confirming progression on this chemotherapy regimen.  All toxicities from Doxil and Avastin have resolved and she is asymptomatic from her cancer.  Current Meds:  Outpatient Encounter Prescriptions as of 07/24/2017  Medication Sig  . atorvastatin (LIPITOR) 40 MG tablet Take 1 tablet (40 mg total) by mouth daily.  . benazepril (LOTENSIN) 10 MG tablet Take 1 tablet (10 mg total) by mouth daily.  . diazepam (VALIUM) 5 MG tablet Take 1  tablet (5 mg total) by mouth every 4 (four) hours as needed (spasms).  . furosemide (LASIX) 20 MG tablet Take 1 tablet (20 mg total) by mouth 2 (two) times daily.  Marland Kitchen glucosamine-chondroitin 500-400 MG tablet Take 1 tablet by mouth 2 (two) times daily.  Marland Kitchen HYDROcodone-acetaminophen (NORCO) 10-325 MG tablet Take 1 tablet by mouth every 4 (four) hours as needed.  . lidocaine-prilocaine (EMLA) cream Apply a quarter size amount to port site 1 hour prior to chemo. Do not rub in. Cover with plastic wrap.  . metFORMIN (GLUCOPHAGE) 1000 MG tablet TAKE 1 TABLET BY MOUTH TWICE DAILY WITH A MEAL  . Multiple Vitamin (MULTIVITAMIN WITH MINERALS) TABS Take 1 tablet by mouth daily.  . multivitamin-lutein (OCUVITE-LUTEIN) CAPS capsule Take 1 capsule by mouth daily.  Marland Kitchen NOVOLOG 100 UNIT/ML injection USE IN INSULIN PUMP AS DIRECTED. MAX DAILY DOSE OF 110 UNITS PER DAY  . omeprazole (PRILOSEC) 40 MG capsule   . ondansetron (ZOFRAN) 8 MG tablet Take by mouth every 8 (eight) hours as needed for nausea or vomiting.  . ONE TOUCH ULTRA TEST test strip USE TO CHECK BLOOD SUGAR UP TO 5 TIMES A DAY  . polyethylene glycol (MIRALAX / GLYCOLAX) packet Take 17 g by mouth daily.  Marland Kitchen senna (SENOKOT) 8.6 MG TABS tablet Take 1 tablet (8.6 mg total) by mouth at bedtime as needed for moderate constipation.  . vitamin B-12 (CYANOCOBALAMIN) 1000 MCG tablet Take 1,000 mcg by mouth daily.  . vitamin E (VITAMIN E) 400 UNIT capsule Take 400 Units by mouth daily.  No facility-administered encounter medications on file as of 07/24/2017.     Allergy:  Allergies  Allergen Reactions  . Avandia [Rosiglitazone] Other (See Comments)    Legs swelled  . Micronase [Glyburide] Swelling  . Actos [Pioglitazone] Other (See Comments)    Edema / leg swelling    Social Hx:   Social History   Social History  . Marital status: Married    Spouse name: Gershon Mussel  . Number of children: 2  . Years of education: N/A   Occupational History  . retired     Social History Main Topics  . Smoking status: Never Smoker  . Smokeless tobacco: Never Used  . Alcohol use No  . Drug use: No  . Sexual activity: Not on file     Comment: married   Other Topics Concern  . Not on file   Social History Narrative  . No narrative on file    Past Surgical Hx:  Past Surgical History:  Procedure Laterality Date  . ABDOMINAL HYSTERECTOMY  10/14/2016  . CESAREAN SECTION    . DEBULKING N/A 10/14/2016   Procedure: DEBULKING;  Surgeon: Everitt Amber, MD;  Location: [facility];  Service: Gynecology;  Laterality: N/A;  . IR PARACENTESIS  06/01/2017  . LAPAROTOMY WITH STAGING N/A 10/14/2016   Procedure: LAPAROTOMY WITH OMENTECTOMY AND TUMOR DEBULGING;  Surgeon: Everitt Amber, MD;  Location: [facility];  Service: Gynecology;  Laterality: N/A;  . LUMBAR FUSION  08/21/15   L3-L4 Dr. Timmothy Euler  . OMENTECTOMY N/A 10/14/2016   Procedure: OMENTECTOMY;  Surgeon: Everitt Amber, MD;  Location: [facility];  Service: Gynecology;  Laterality: N/A;  . ROBOTIC ASSISTED TOTAL HYSTERECTOMY WITH BILATERAL SALPINGO OOPHERECTOMY Bilateral 10/14/2016   Procedure: XI ROBOTIC ASSISTED TOTAL LAPARSCOPIC  HYSTERECTOMY WITH BILATERAL SALPINGO OOPHORECTOMY;  Surgeon: Everitt Amber, MD;  Location: [facility];  Service: Gynecology;  Laterality: Bilateral;    Past Medical Hx:  Past Medical History:  Diagnosis Date  . Diabetes mellitus without complication (HCC)    on insulin pump  . Dysrhythmia   . 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[place]) 05/09/2016  . Family history of breast cancer   . GERD (gastroesophageal reflux disease)   . History of blood transfusion   . History of bronchitis   . History of chemotherapy   . History of urinary tract infection   . Hyperlipidemia   . Hypertension   . Low serum vitamin D   . Ovarian cancer ([place]) 05/09/2016  . Shingles     Past Gynecological History:  No LMP recorded. Patient has had a hysterectomy.  Family Hx:  Family History  Problem Relation Age of  Onset  . Lung cancer Mother        smoker; dx in her 24s  . Leukemia Father   . Diabetes Paternal Grandmother   . Heart attack Paternal Grandmother   . Diabetes Paternal Grandfather   . Breast cancer Paternal Aunt        dx in her 16s-30s  . Leukemia Paternal Uncle   . Heart attack Maternal Grandfather   . Breast cancer Cousin        maternal first cousin    Review of Systems:  Constitutional  Feels fatigued  ENT Normal appearing ears and nares bilaterally Skin/Breast  resolved right temporal and eyebrow vesicular rash Cardiovascular  No chest pain, shortness of breath, or edema  Pulmonary  No cough or wheeze.  Gastro Intestinal  improved bloating, nausea, early satiety Genito Urinary  No frequency, urgency,  dysuria, no bleeding Musculo Skeletal  No myalgia, arthralgia, joint swelling or pain  Neurologic  No weakness, numbness, change in gait,  Psychology  No depression, anxiety, insomnia.   Vitals:  Blood pressure (!) 142/69, pulse 86, temperature (!) 97.5 F (36.4 C), temperature source Oral, resp. rate 20, weight 210 lb 8 oz (95.5 kg), SpO2 98 %.  Physical Exam: WD in NAD Neck  Supple NROM, without any enlargements.  Lymph Node Survey No cervical supraclavicular or inguinal adenopathy Cardiovascular  Pulse normal rate, regularity and rhythm. S1 and S2 normal.  Lungs  Clear to auscultation bilateraly, without wheezes/crackles/rhonchi. Good air movement.  Skin  No rash/lesions/breakdown  Psychiatry  Alert and oriented to person, place, and time  Abdomen  Normoactive bowel sounds, abdomen soft, non-tender and obese without evidence of hernia.  Back No CVA tenderness Genito Urinary: surgically absent uterus and cervix. Vaginal cuff in tact and not bleeding. Rectal  deferred Extremities  No bilateral cyanosis, clubbing or edema.   45 minutes of direct face to face counseling time was spent with the patient. This included discussion about prognosis,  therapy recommendations and postoperative side effects.  Donaciano Eva, MD  07/24/2017, 6:45 PM

## 2017-07-30 ENCOUNTER — Other Ambulatory Visit: Payer: Self-pay | Admitting: Hematology and Oncology

## 2017-07-30 ENCOUNTER — Other Ambulatory Visit (HOSPITAL_BASED_OUTPATIENT_CLINIC_OR_DEPARTMENT_OTHER): Payer: BC Managed Care – PPO

## 2017-07-30 ENCOUNTER — Ambulatory Visit: Payer: BC Managed Care – PPO

## 2017-07-30 ENCOUNTER — Ambulatory Visit (HOSPITAL_BASED_OUTPATIENT_CLINIC_OR_DEPARTMENT_OTHER): Payer: BC Managed Care – PPO

## 2017-07-30 VITALS — BP 108/56 | HR 73 | Temp 97.9°F | Resp 16

## 2017-07-30 DIAGNOSIS — C481 Malignant neoplasm of specified parts of peritoneum: Secondary | ICD-10-CM

## 2017-07-30 DIAGNOSIS — C801 Malignant (primary) neoplasm, unspecified: Secondary | ICD-10-CM

## 2017-07-30 DIAGNOSIS — E538 Deficiency of other specified B group vitamins: Secondary | ICD-10-CM

## 2017-07-30 DIAGNOSIS — D61810 Antineoplastic chemotherapy induced pancytopenia: Secondary | ICD-10-CM

## 2017-07-30 DIAGNOSIS — C786 Secondary malignant neoplasm of retroperitoneum and peritoneum: Secondary | ICD-10-CM

## 2017-07-30 DIAGNOSIS — Z5111 Encounter for antineoplastic chemotherapy: Secondary | ICD-10-CM | POA: Diagnosis not present

## 2017-07-30 DIAGNOSIS — T451X5A Adverse effect of antineoplastic and immunosuppressive drugs, initial encounter: Secondary | ICD-10-CM

## 2017-07-30 LAB — CBC WITH DIFFERENTIAL/PLATELET
BASO%: 0.3 % (ref 0.0–2.0)
BASOS ABS: 0 10*3/uL (ref 0.0–0.1)
EOS ABS: 0.3 10*3/uL (ref 0.0–0.5)
EOS%: 8.9 % — ABNORMAL HIGH (ref 0.0–7.0)
HEMATOCRIT: 30.7 % — AB (ref 34.8–46.6)
HEMOGLOBIN: 9.4 g/dL — AB (ref 11.6–15.9)
LYMPH#: 0.5 10*3/uL — AB (ref 0.9–3.3)
LYMPH%: 14 % (ref 14.0–49.7)
MCH: 29.7 pg (ref 25.1–34.0)
MCHC: 30.6 g/dL — ABNORMAL LOW (ref 31.5–36.0)
MCV: 97.2 fL (ref 79.5–101.0)
MONO#: 0.4 10*3/uL (ref 0.1–0.9)
MONO%: 10.9 % (ref 0.0–14.0)
NEUT#: 2.3 10*3/uL (ref 1.5–6.5)
NEUT%: 65.9 % (ref 38.4–76.8)
NRBC: 0 % (ref 0–0)
PLATELETS: 169 10*3/uL (ref 145–400)
RBC: 3.16 10*6/uL — ABNORMAL LOW (ref 3.70–5.45)
RDW: 16 % — AB (ref 11.2–14.5)
WBC: 3.5 10*3/uL — ABNORMAL LOW (ref 3.9–10.3)

## 2017-07-30 LAB — COMPREHENSIVE METABOLIC PANEL
ALBUMIN: 3.5 g/dL (ref 3.5–5.0)
ALT: 16 U/L (ref 0–55)
ANION GAP: 9 meq/L (ref 3–11)
AST: 20 U/L (ref 5–34)
Alkaline Phosphatase: 128 U/L (ref 40–150)
BUN: 29.4 mg/dL — ABNORMAL HIGH (ref 7.0–26.0)
CO2: 25 mEq/L (ref 22–29)
Calcium: 9.7 mg/dL (ref 8.4–10.4)
Chloride: 110 mEq/L — ABNORMAL HIGH (ref 98–109)
Creatinine: 1 mg/dL (ref 0.6–1.1)
EGFR: 60 mL/min/{1.73_m2} — AB (ref >90)
GLUCOSE: 101 mg/dL (ref 70–140)
Potassium: 4.3 mEq/L (ref 3.5–5.1)
Sodium: 144 mEq/L (ref 136–145)
TOTAL PROTEIN: 7.3 g/dL (ref 6.4–8.3)
Total Bilirubin: 0.26 mg/dL (ref 0.20–1.20)

## 2017-07-30 LAB — FERRITIN: FERRITIN: 615 ng/mL — AB (ref 9–269)

## 2017-07-30 LAB — IRON AND TIBC
%SAT: 37 % (ref 21–57)
IRON: 90 ug/dL (ref 41–142)
TIBC: 244 ug/dL (ref 236–444)
UIBC: 153 ug/dL (ref 120–384)

## 2017-07-30 MED ORDER — SODIUM CHLORIDE 0.9% FLUSH
10.0000 mL | INTRAVENOUS | Status: DC | PRN
  Administered 2017-07-30: 10 mL
  Filled 2017-07-30: qty 10

## 2017-07-30 MED ORDER — SODIUM CHLORIDE 0.9 % IV SOLN
Freq: Once | INTRAVENOUS | Status: AC
  Administered 2017-07-30: 11:00:00 via INTRAVENOUS

## 2017-07-30 MED ORDER — PROCHLORPERAZINE MALEATE 10 MG PO TABS
10.0000 mg | ORAL_TABLET | Freq: Once | ORAL | Status: AC
  Administered 2017-07-30: 10 mg via ORAL

## 2017-07-30 MED ORDER — HEPARIN SOD (PORK) LOCK FLUSH 100 UNIT/ML IV SOLN
500.0000 [IU] | Freq: Once | INTRAVENOUS | Status: AC | PRN
  Administered 2017-07-30: 500 [IU]
  Filled 2017-07-30: qty 5

## 2017-07-30 MED ORDER — PROCHLORPERAZINE MALEATE 10 MG PO TABS
ORAL_TABLET | ORAL | Status: AC
  Filled 2017-07-30: qty 1

## 2017-07-30 MED ORDER — GEMCITABINE HCL CHEMO INJECTION 1 GM/26.3ML
800.0000 mg/m2 | Freq: Once | INTRAVENOUS | Status: AC
  Administered 2017-07-30: 1634 mg via INTRAVENOUS
  Filled 2017-07-30: qty 42.98

## 2017-07-30 MED ORDER — SODIUM CHLORIDE 0.9% FLUSH
10.0000 mL | Freq: Once | INTRAVENOUS | Status: AC
  Administered 2017-07-30: 10 mL
  Filled 2017-07-30: qty 10

## 2017-07-31 ENCOUNTER — Other Ambulatory Visit: Payer: Self-pay

## 2017-07-31 DIAGNOSIS — C569 Malignant neoplasm of unspecified ovary: Secondary | ICD-10-CM

## 2017-07-31 LAB — FOLATE RBC: Hematocrit: 29.7 % — ABNORMAL LOW (ref 34.0–46.6)

## 2017-07-31 LAB — CA 125

## 2017-07-31 LAB — VITAMIN B12: VITAMIN B 12: 712 pg/mL (ref 232–1245)

## 2017-07-31 MED ORDER — HYDROCODONE-ACETAMINOPHEN 10-325 MG PO TABS: 1.0000 | ORAL_TABLET | ORAL | 0 refills | Status: DC | PRN

## 2017-08-03 ENCOUNTER — Inpatient Hospital Stay (HOSPITAL_COMMUNITY)
Admission: EM | Admit: 2017-08-03 | Discharge: 2017-08-05 | DRG: 871 | Disposition: A | Discharge status: Home / Self Care | Payer: BC Managed Care – PPO | Attending: Internal Medicine | Admitting: Internal Medicine

## 2017-08-03 ENCOUNTER — Encounter (HOSPITAL_COMMUNITY): Payer: Self-pay | Admitting: *Deleted

## 2017-08-03 DIAGNOSIS — Z1623 Resistance to quinolones and fluoroquinolones: Secondary | ICD-10-CM | POA: Diagnosis present

## 2017-08-03 DIAGNOSIS — C482 Malignant neoplasm of peritoneum, unspecified: Secondary | ICD-10-CM | POA: Diagnosis present

## 2017-08-03 DIAGNOSIS — E119 Type 2 diabetes mellitus without complications: Secondary | ICD-10-CM

## 2017-08-03 DIAGNOSIS — Z79899 Other long term (current) drug therapy: Secondary | ICD-10-CM

## 2017-08-03 DIAGNOSIS — N179 Acute kidney failure, unspecified: Secondary | ICD-10-CM | POA: Diagnosis not present

## 2017-08-03 DIAGNOSIS — R509 Fever, unspecified: Secondary | ICD-10-CM

## 2017-08-03 DIAGNOSIS — D6481 Anemia due to antineoplastic chemotherapy: Secondary | ICD-10-CM | POA: Diagnosis not present

## 2017-08-03 DIAGNOSIS — Z794 Long term (current) use of insulin: Secondary | ICD-10-CM | POA: Diagnosis not present

## 2017-08-03 DIAGNOSIS — C569 Malignant neoplasm of unspecified ovary: Secondary | ICD-10-CM

## 2017-08-03 DIAGNOSIS — C481 Malignant neoplasm of specified parts of peritoneum: Secondary | ICD-10-CM | POA: Diagnosis not present

## 2017-08-03 DIAGNOSIS — A419 Sepsis, unspecified organism: Secondary | ICD-10-CM | POA: Diagnosis not present

## 2017-08-03 DIAGNOSIS — I10 Essential (primary) hypertension: Secondary | ICD-10-CM | POA: Diagnosis present

## 2017-08-03 DIAGNOSIS — E872 Acidosis: Secondary | ICD-10-CM | POA: Diagnosis present

## 2017-08-03 DIAGNOSIS — T451X5A Adverse effect of antineoplastic and immunosuppressive drugs, initial encounter: Secondary | ICD-10-CM | POA: Diagnosis present

## 2017-08-03 DIAGNOSIS — R197 Diarrhea, unspecified: Secondary | ICD-10-CM | POA: Diagnosis present

## 2017-08-03 DIAGNOSIS — N390 Urinary tract infection, site not specified: Secondary | ICD-10-CM | POA: Diagnosis not present

## 2017-08-03 DIAGNOSIS — D691 Qualitative platelet defects: Secondary | ICD-10-CM | POA: Diagnosis not present

## 2017-08-03 DIAGNOSIS — Z981 Arthrodesis status: Secondary | ICD-10-CM

## 2017-08-03 DIAGNOSIS — Z1611 Resistance to penicillins: Secondary | ICD-10-CM | POA: Diagnosis present

## 2017-08-03 DIAGNOSIS — D61810 Antineoplastic chemotherapy induced pancytopenia: Secondary | ICD-10-CM | POA: Diagnosis present

## 2017-08-03 DIAGNOSIS — A4151 Sepsis due to Escherichia coli [E. coli]: Secondary | ICD-10-CM | POA: Diagnosis present

## 2017-08-03 DIAGNOSIS — E785 Hyperlipidemia, unspecified: Secondary | ICD-10-CM | POA: Diagnosis present

## 2017-08-03 DIAGNOSIS — Z9641 Presence of insulin pump (external) (internal): Secondary | ICD-10-CM | POA: Diagnosis present

## 2017-08-03 DIAGNOSIS — G9341 Metabolic encephalopathy: Secondary | ICD-10-CM | POA: Diagnosis present

## 2017-08-03 DIAGNOSIS — B9620 Unspecified Escherichia coli [E. coli] as the cause of diseases classified elsewhere: Secondary | ICD-10-CM | POA: Diagnosis not present

## 2017-08-03 DIAGNOSIS — J181 Lobar pneumonia, unspecified organism: Secondary | ICD-10-CM | POA: Diagnosis not present

## 2017-08-03 DIAGNOSIS — K219 Gastro-esophageal reflux disease without esophagitis: Secondary | ICD-10-CM | POA: Diagnosis present

## 2017-08-03 DIAGNOSIS — Z8543 Personal history of malignant neoplasm of ovary: Secondary | ICD-10-CM

## 2017-08-03 DIAGNOSIS — E875 Hyperkalemia: Secondary | ICD-10-CM | POA: Diagnosis present

## 2017-08-03 DIAGNOSIS — D696 Thrombocytopenia, unspecified: Secondary | ICD-10-CM | POA: Diagnosis present

## 2017-08-03 LAB — I-STAT CG4 LACTIC ACID, ED: Lactic Acid, Venous: 2.41 mmol/L (ref 0.5–1.9)

## 2017-08-03 LAB — COMPREHENSIVE METABOLIC PANEL
ALBUMIN: 3.4 g/dL — AB (ref 3.5–5.0)
ALK PHOS: 96 U/L (ref 38–126)
ALT: 26 U/L (ref 14–54)
AST: 31 U/L (ref 15–41)
Anion gap: 12 (ref 5–15)
BUN: 58 mg/dL — AB (ref 6–20)
CALCIUM: 9.1 mg/dL (ref 8.9–10.3)
CO2: 20 mmol/L — AB (ref 22–32)
CREATININE: 2.04 mg/dL — AB (ref 0.44–1.00)
Chloride: 107 mmol/L (ref 101–111)
GFR calc Af Amer: 29 mL/min — ABNORMAL LOW (ref >60)
GFR calc non Af Amer: 25 mL/min — ABNORMAL LOW (ref >60)
GLUCOSE: 128 mg/dL — AB (ref 65–99)
Potassium: 5.2 mmol/L — ABNORMAL HIGH (ref 3.5–5.1)
SODIUM: 139 mmol/L (ref 135–145)
Total Bilirubin: 0.4 mg/dL (ref 0.3–1.2)
Total Protein: 7.1 g/dL (ref 6.5–8.1)

## 2017-08-03 LAB — URINALYSIS, ROUTINE W REFLEX MICROSCOPIC
Bilirubin Urine: NEGATIVE
GLUCOSE, UA: NEGATIVE mg/dL
KETONES UR: NEGATIVE mg/dL
NITRITE: NEGATIVE
PH: 5 (ref 5.0–8.0)
Protein, ur: NEGATIVE mg/dL
SPECIFIC GRAVITY, URINE: 1.008 (ref 1.005–1.030)

## 2017-08-03 LAB — STREP PNEUMONIAE URINARY ANTIGEN: STREP PNEUMO URINARY ANTIGEN: NEGATIVE

## 2017-08-03 LAB — CBC WITH DIFFERENTIAL/PLATELET
BASOS PCT: 0 %
Basophils Absolute: 0 10*3/uL (ref 0.0–0.1)
EOS ABS: 0 10*3/uL (ref 0.0–0.7)
Eosinophils Relative: 0 %
HCT: 26.9 % — ABNORMAL LOW (ref 36.0–46.0)
Hemoglobin: 8.4 g/dL — ABNORMAL LOW (ref 12.0–15.0)
Lymphocytes Relative: 4 %
Lymphs Abs: 0.3 10*3/uL — ABNORMAL LOW (ref 0.7–4.0)
MCH: 30 pg (ref 26.0–34.0)
MCHC: 31.2 g/dL (ref 30.0–36.0)
MCV: 96.1 fL (ref 78.0–100.0)
MONO ABS: 0.1 10*3/uL (ref 0.1–1.0)
Monocytes Relative: 1 %
Neutro Abs: 7.8 10*3/uL — ABNORMAL HIGH (ref 1.7–7.7)
Neutrophils Relative %: 95 %
Platelets: 172 10*3/uL (ref 150–400)
RBC: 2.8 MIL/uL — ABNORMAL LOW (ref 3.87–5.11)
RDW: 15.7 % — AB (ref 11.5–15.5)
WBC: 8.2 10*3/uL (ref 4.0–10.5)

## 2017-08-03 LAB — GLUCOSE, CAPILLARY
GLUCOSE-CAPILLARY: 102 mg/dL — AB (ref 65–99)
Glucose-Capillary: 64 mg/dL — ABNORMAL LOW (ref 65–99)
Glucose-Capillary: 146 mg/dL — ABNORMAL HIGH (ref 65–99)

## 2017-08-03 LAB — INFLUENZA PANEL BY PCR (TYPE A & B)
INFLAPCR: NEGATIVE
INFLBPCR: NEGATIVE

## 2017-08-03 LAB — LACTIC ACID, PLASMA
Lactic Acid, Venous: 1.3 mmol/L (ref 0.5–1.9)
Lactic Acid, Venous: 0.9 mmol/L (ref 0.5–1.9)

## 2017-08-03 LAB — CBG MONITORING, ED: GLUCOSE-CAPILLARY: 107 mg/dL — AB (ref 65–99)

## 2017-08-03 MED ORDER — ACETAMINOPHEN 650 MG RE SUPP: 650.0000 mg | Freq: Four times a day (QID) | RECTAL | Status: DC | PRN

## 2017-08-03 MED ORDER — PANTOPRAZOLE SODIUM 40 MG PO TBEC
40.0000 mg | DELAYED_RELEASE_TABLET | Freq: Every day | ORAL | Status: DC
  Administered 2017-08-03 – 2017-08-05 (×3): 40 mg via ORAL
  Filled 2017-08-03 (×3): qty 1

## 2017-08-03 MED ORDER — DIAZEPAM 2 MG PO TABS
2.0000 mg | ORAL_TABLET | Freq: Four times a day (QID) | ORAL | Status: DC | PRN
  Administered 2017-08-05: 2 mg via ORAL
  Filled 2017-08-03: qty 1

## 2017-08-03 MED ORDER — DEXTROSE 5 % IV SOLN
1.0000 g | INTRAVENOUS | Status: DC
  Administered 2017-08-04 – 2017-08-05 (×2): 1 g via INTRAVENOUS
  Filled 2017-08-03 (×2): qty 1

## 2017-08-03 MED ORDER — SODIUM CHLORIDE 0.9 % IV BOLUS (SEPSIS)
1000.0000 mL | Freq: Once | INTRAVENOUS | Status: AC
  Administered 2017-08-03: 1000 mL via INTRAVENOUS

## 2017-08-03 MED ORDER — INSULIN PUMP
Freq: Three times a day (TID) | SUBCUTANEOUS | Status: DC
  Administered 2017-08-03 – 2017-08-05 (×5): via SUBCUTANEOUS
  Filled 2017-08-03: qty 1

## 2017-08-03 MED ORDER — SODIUM CHLORIDE 0.9 % IV SOLN
INTRAVENOUS | Status: DC
  Administered 2017-08-03 – 2017-08-05 (×4): via INTRAVENOUS

## 2017-08-03 MED ORDER — ONDANSETRON HCL 4 MG PO TABS
4.0000 mg | ORAL_TABLET | Freq: Three times a day (TID) | ORAL | Status: DC | PRN
  Filled 2017-08-03: qty 1

## 2017-08-03 MED ORDER — DEXTROSE 5 % IV SOLN
2.0000 g | Freq: Once | INTRAVENOUS | Status: AC
  Administered 2017-08-03: 2 g via INTRAVENOUS
  Filled 2017-08-03: qty 2

## 2017-08-03 MED ORDER — HYDROCODONE-ACETAMINOPHEN 10-325 MG PO TABS
1.0000 | ORAL_TABLET | Freq: Four times a day (QID) | ORAL | Status: DC | PRN
  Administered 2017-08-04 – 2017-08-05 (×2): 1 via ORAL
  Filled 2017-08-03 (×2): qty 1

## 2017-08-03 MED ORDER — POLYETHYLENE GLYCOL 3350 17 G PO PACK: 17.0000 g | PACK | Freq: Every day | ORAL | Status: DC | PRN

## 2017-08-03 MED ORDER — VANCOMYCIN HCL IN DEXTROSE 1-5 GM/200ML-% IV SOLN
1000.0000 mg | INTRAVENOUS | Status: DC
  Administered 2017-08-04: 1000 mg via INTRAVENOUS
  Filled 2017-08-03 (×2): qty 200

## 2017-08-03 MED ORDER — VANCOMYCIN HCL 10 G IV SOLR
1500.0000 mg | Freq: Once | INTRAVENOUS | Status: AC
  Administered 2017-08-03: 1500 mg via INTRAVENOUS
  Filled 2017-08-03: qty 1500

## 2017-08-03 MED ORDER — ENOXAPARIN SODIUM 30 MG/0.3ML ~~LOC~~ SOLN
30.0000 mg | SUBCUTANEOUS | Status: DC
  Administered 2017-08-03: 30 mg via SUBCUTANEOUS
  Filled 2017-08-03: qty 0.3

## 2017-08-03 MED ORDER — ACETAMINOPHEN 325 MG PO TABS
650.0000 mg | ORAL_TABLET | Freq: Four times a day (QID) | ORAL | Status: DC | PRN
  Administered 2017-08-04: 650 mg via ORAL
  Filled 2017-08-03: qty 2

## 2017-08-03 NOTE — ED Triage Notes (Signed)
Pt c/o fever and altered LOC that started last evening; pt has had chemo; pt denies any pain at this time

## 2017-08-03 NOTE — Progress Notes (Signed)
Pharmacy Antibiotic Note  Ellen Hunt is a 66 y.o. female admitted on 08/03/2017 with sepsis.  Pharmacy has been consulted for VANCOMYCIN dosing.  Plan: Vancomycin 1500mg  x 1 today then 1000mg  IV q24hrs (renally adjusted) Monitor labs, progress, c/s  Height: 5\' 2"  (157.5 cm) Weight: 208 lb (94.3 kg) IBW/kg (Calculated) : 50.1  Temp (24hrs), Avg:99.1 F (37.3 C), Min:98.4 F (36.9 C), Max:99.5 F (37.5 C)   Recent Labs Lab 07/30/17 0938 07/30/17 0938 08/03/17 0437 08/03/17 0451 08/03/17 0958  WBC 3.5*  --  8.2  --   --   CREATININE  --  1.0 2.04*  --   --   LATICACIDVEN  --   --   --  2.41* 1.3    Estimated Creatinine Clearance: 29.8 mL/min (A) (by C-G formula based on SCr of 2.04 mg/dL (H)).    Allergies  Allergen Reactions  . Avandia [Rosiglitazone] Other (See Comments)    Legs swelled  . Micronase [Glyburide] Swelling  . Actos [Pioglitazone] Other (See Comments)    Edema / leg swelling    Antimicrobials this admission: Vancomycin 10/1 >>   Dose adjustments this admission:  Microbiology results:  BCx: pending  UCx: pending   Sputum:    MRSA PCR:   Thank you for allowing pharmacy to be a part of this patient's care.  Hart Robinsons A 08/03/2017 1:28 PM

## 2017-08-03 NOTE — ED Notes (Signed)
Report given to Blanch Media, RN for room 324.

## 2017-08-03 NOTE — ED Notes (Signed)
Blood cultures x 2 drawn.

## 2017-08-03 NOTE — ED Notes (Signed)
Pt's husband came out to the nurses station asking if the pt could have something to drink. Pt's husband made aware by this RN that the pt is unable to have anything to eat or drink until seen by the physician. Pt's husband looked at another RN and stated, "I know she has a fever and is dehydrated and so do you." Pt then turned around and went back to the room.

## 2017-08-03 NOTE — H&P (Signed)
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 Initial evaluation revealed intermittent hypotension, elevated lactic acid, left lower lobe pneumonia.  Patient received chemotherapy Thursday 9/27, she was advised that she would experience of flulike illness afterwards. 9/29, 9/30 she developed flulike symptoms, myalgias and had an episode of confusion 9/29 in the evening where she was trying to figure out if she had an appointment on Sunday. This seemed to improve and the following morning 9/30 her husband reports she was somewhat better. However in the evening she again became confused and generally weak, and one point sliding to the floor. She was unable to manipulate her insulin pump although she has been using it for 20 years. He took her temperature and her fever was over 102. She's had no cough. Reports fairly good oral intake. No specific aggravating or alleviating factors, however might of been worsened by Valium and hydrocodone although she usually takes medications, volume dose was recently increased.  ED Course: Afebrile, hypotensive SBP 80-100s. Treated with cefepime, 2 L normal saline   Review of Systems:  Negative for new visual changes over the last few days, rash, chest pain, shortness of breath, cough, dysuria, hesitancy, bleeding, nausea, vomiting, abdominal pain.  Positive for fever, sore throat   Past Medical History:  Diagnosis Date  . Diabetes mellitus without complication (HCC)    on insulin pump  . Dysrhythmia   . 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[place]) 05/09/2016  . Family history of  breast cancer   . GERD (gastroesophageal reflux disease)   . History of blood transfusion   . History of bronchitis   . History of chemotherapy   . History of urinary tract infection   . Hyperlipidemia   . Hypertension   . Low serum vitamin D   . Ovarian cancer ([place]) 05/09/2016  . Shingles     Past Surgical History:  Procedure Laterality Date  . ABDOMINAL HYSTERECTOMY  10/14/2016  . CESAREAN SECTION    . DEBULKING N/A 10/14/2016   Procedure: DEBULKING;  Surgeon: Everitt Amber, MD;  Location: [facility];  Service: Gynecology;  Laterality: N/A;  . IR PARACENTESIS  06/01/2017  . LAPAROTOMY WITH STAGING N/A 10/14/2016   Procedure: LAPAROTOMY WITH OMENTECTOMY AND TUMOR DEBULGING;  Surgeon: Everitt Amber, MD;  Location: [facility];  Service: Gynecology;  Laterality: N/A;  . LUMBAR FUSION  08/21/15   L3-L4 Dr. Timmothy Euler  . OMENTECTOMY N/A 10/14/2016   Procedure: OMENTECTOMY;  Surgeon: Everitt Amber, MD;  Location: [facility];  Service: Gynecology;  Laterality: N/A;  . ROBOTIC ASSISTED TOTAL HYSTERECTOMY WITH BILATERAL SALPINGO OOPHERECTOMY Bilateral 10/14/2016   Procedure: XI ROBOTIC ASSISTED TOTAL LAPARSCOPIC  HYSTERECTOMY WITH BILATERAL SALPINGO OOPHORECTOMY;  Surgeon: Everitt Amber, MD;  Location: [facility];  Service: Gynecology;  Laterality: Bilateral;     reports that she has never smoked. She has never used smokeless tobacco. She reports that she does not drink alcohol or use drugs. Mobility: Ambulatory  Allergies  Allergen Reactions  . Avandia [Rosiglitazone] Other (See Comments)    Legs swelled  . Micronase [Glyburide] Swelling  . Actos [Pioglitazone] Other (See Comments)    Edema / leg swelling    Family History  Problem Relation Age of  Onset  . Lung cancer Mother        smoker; dx in her 31s  . Leukemia Father   . Diabetes Paternal Grandmother   . Heart attack Paternal Grandmother   . Diabetes Paternal Grandfather   . Breast cancer Paternal Aunt        dx in her 67s-30s  . Leukemia  Paternal Uncle   . Heart attack Maternal Grandfather   . Breast cancer Cousin        maternal first cousin     Prior to Admission medications   Medication Sig Start Date End Date Taking? Authorizing Provider  atorvastatin (LIPITOR) 40 MG tablet Take 1 tablet (40 mg total) by mouth daily. 03/25/17   Ellen Maize, MD  benazepril (LOTENSIN) 10 MG tablet Take 1 tablet (10 mg total) by mouth daily. 03/25/17   Ellen Maize, MD  diazepam (VALIUM) 5 MG tablet Take 1 tablet (5 mg total) by mouth every 4 (four) hours as needed (spasms). 06/22/17   Heath Lark, MD  furosemide (LASIX) 20 MG tablet Take 1 tablet (20 mg total) by mouth 2 (two) times daily. 07/07/17   Heath Lark, MD  glucosamine-chondroitin 500-400 MG tablet Take 1 tablet by mouth 2 (two) times daily.    [provider]  HYDROcodone-acetaminophen (NORCO) 10-325 MG tablet Take 1 tablet by mouth every 4 (four) hours as needed. 07/31/17   Heath Lark, MD  lidocaine-prilocaine (EMLA) cream Apply a quarter size amount to port site 1 hour prior to chemo. Do not rub in. Cover with plastic wrap. 05/10/16   Penland, Kelby Fam, MD  metFORMIN (GLUCOPHAGE) 1000 MG tablet TAKE 1 TABLET BY MOUTH TWICE DAILY WITH A MEAL 03/25/17   Ellen Maize, MD  Multiple Vitamin (MULTIVITAMIN WITH MINERALS) TABS Take 1 tablet by mouth daily.    [provider]  multivitamin-lutein (OCUVITE-LUTEIN) CAPS capsule Take 1 capsule by mouth daily.    [provider]  NOVOLOG 100 UNIT/ML injection USE IN INSULIN PUMP AS DIRECTED. MAX DAILY DOSE OF 110 UNITS PER DAY 06/22/17   Ellen Maize, MD  omeprazole (PRILOSEC) 40 MG capsule  05/25/17   [provider]  ondansetron (ZOFRAN) 8 MG tablet Take by mouth every 8 (eight) hours as needed for nausea or vomiting.    [provider]  ONE TOUCH ULTRA TEST test strip USE TO CHECK BLOOD SUGAR UP TO 5 TIMES A DAY 12/26/16   [name], MD  polyethylene glycol [facility] / Floria Raveling)  packet Take 17 g by mouth daily.    [provider]  senna (SENOKOT) 8.6 MG TABS tablet Take 1 tablet (8.6 mg total) by mouth at bedtime as needed for moderate constipation. 10/15/16   Everitt Amber, MD  vitamin B-12 (CYANOCOBALAMIN) 1000 MCG tablet Take 1,000 mcg by mouth daily.    [provider]  vitamin E (VITAMIN E) 400 UNIT capsule Take 400 Units by mouth daily.    [provider]    Physical Exam:   99.2, 20, 88, 109/47, 93% on room air. Appears calm, comfortable, mildly confused.  Eyes. Pupils, irises, lids appear unremarkable.  ENT. Grossly normal hearing, lips, tongue.  Neck. No lymphadenopathy or masses. No thyromegaly.  Respiratory. Clear to auscultation bilaterally. No wheezes, rales or rhonchi. Normal respiratory effort.  Cardiovascular. Regular rate and rhythm. No murmur, rub or gallop. No lower extremity edema.  Abdomen. Obese. Soft, nondistended, mild right upper quadrant pain with palpation. Insulin pump in place. No  hepatomegaly.  Musculoskeletal. Moves all extremities well. Grossly normal tone and strength. Digits of the upper extremities appear unremarkable.  Skin. No rash or induration noted. Nontender to palpation.  Psychiatric. Slightly confused. Oriented to self, location, month or year. Mood and affect appear normal.  Wt Readings from Last 3 Encounters:  08/03/17 95.3 kg (210 lb)  07/24/17 95.5 kg (210 lb 8 oz)  07/21/17 97.2 kg (214 lb 4.8 oz)    I have personally reviewed following labs and imaging studies  Labs:   Potassium 5.2, BUN 58, creatinine 2.04, LFTs unremarkable  Lactic acid 2.41  WBC 8.2, hemoglobin 8.4, platelets within normal limits  Urinalysis equivocal, WBC 6/30, large leukocyte esterase  Imaging studies:    CT chest, abdomen, pelvis showed mild increase in peritoneal carcinoma without evidence of metastatic disease within the thorax. New airspace opacity left lower lobe consistent with inflammatory  or infectious etiology.  Review and summation of old records:   Office note 9/18 Dr. Alvy Bimler: Malignancy poorly responsive, plan for palliative chemotherapy.   Echocardiogram 05/18/2017: LVEF 60-65%, normal wall motion, no regional wall motion abnormalities, grade 1 diastolic dysfunction  Active Problems:   * No active hospital problems. *   Assessment/Plan Early sepsis secondary to lobar pneumonia with associated acute encephalopathy, elevated lactic acid. Urinalysis equivocal. CT chest, abdomen, pelvis showed mild increase in peritoneal carcinoma without evidence of metastatic disease within the thorax. New airspace opacity left lower lobe consistent with inflammatory or infectious etiology. -Currently no hypoxia, hemodynamics appear stable, plan admission to medical floor -Follow sepsis protocol, trend lactic acid, empiric antibiotics, follow-up culture data  Acute kidney injury with modest hyperkalemia, likely secondary to above, complicated by Lasix -Hold Lasix, administer IV fluids, recheck BMP in a.m.  Extraovarian primary peritoneal carcinoma -Supportive care  Antineoplastic chemotherapy induced anemia -Follow clinically  Diabetes mellitus treated with insulin pump -Pump per protocol, follow closely.  Hypertension, hyperlipidemia appear stable.   Severity of Illness: The appropriate patient status for this patient is INPATIENT. Inpatient status is judged to be reasonable and necessary in order to provide the required intensity of service to ensure the patient's safety. The patient's presenting symptoms, physical exam findings, and initial radiographic and laboratory data in the context of their chronic comorbidities is felt to place them at high risk for further clinical deterioration. Furthermore, it is not anticipated that the patient will be medically stable for discharge from the hospital within 2 midnights of admission. The following factors support the patient status of  inpatient.   * I certify that at the point of admission it is my clinical judgment that the patient will require inpatient hospital care spanning beyond 2 midnights from the point of admission due to high intensity of service, high risk for further deterioration and high frequency of surveillance required.*    DVT prophylaxis: enoxaparin Code Status: full Family Communication: husband at bedside     Time spent: 101 minutes   Murray Hodgkins, MD  [facility] Direct contact: [phone] --[place] [place]  --[email or web address]; password [email or web address]  7PM-7AM contact night coverage as above  08/03/2017, 8:17 AM

## 2017-08-03 NOTE — Progress Notes (Signed)
PHARMACY NOTE:  ANTIMICROBIAL RENAL DOSAGE ADJUSTMENT  Current antimicrobial regimen includes a mismatch between antimicrobial dosage and estimated renal function.  As per policy approved by the Pharmacy & Therapeutics and Medical Executive Committees, the antimicrobial dosage will be adjusted accordingly.  Current antimicrobial dosage:  Cefepime 1 GM IV every 8 hours  Indication: Pneumonia  Renal Function:  Estimated Creatinine Clearance: 30 mL/min (A) (by C-G formula based on SCr of 2.04 mg/dL (H)). []      On intermittent HD, scheduled: []      On CRRT    Antimicrobial dosage has been changed to:  Cefepime 1 GM IV every 24 hours  Additional comments:   Thank you for allowing pharmacy to be a part of this patient's care.  Gildardo Griffes [place], [facility] 08/03/2017 11:26 AM

## 2017-08-03 NOTE — ED Provider Notes (Signed)
[facility] DEPT Provider Note   CSN: [id number] Arrival date & time: 08/03/17  0358  Time seen 04:30 AM  History   Chief Complaint Chief Complaint  Patient presents with  . Fever    HPI Ellen Hunt is a 66 y.o. female.  HPI  Per husband patient last got chemotherapy on September 27.on September 29 about 10:30 evening he noted she was on her phone was trying to call somebody. When he asked her what she was doing she had been in my chart and thought she had an appointment and she was trying to call the hospital. He finally convinced her that her appointment wasn't until next week. He states she seemed fine yesterday, September 30 until the evening when he noted she was messing around with her insulin pump. He states she's had one for about 15 years.he states she reloaded it with insulin but couldn't figure out how to reprogram it to start running again. He states he had to call the company to get the instructions to get it working. He states he was awakened at 2 AM is in the bathroom and she had a pinning liner sitting on the top of her head and states she was getting ready to go out. He states when he touched her she felt hot and her temperature was 101.2. She is scheduled to get chemotherapy again on October 4. Patient does have a port.  Patient was diagnosed with ovarian cancer last year.she is on her third different chemotherapy regimen due to failure of the other two to get good response.  PCP Eustaquio Maize, MD Oncology Dr Alvy Bimler   Past Medical History:  Diagnosis Date  . Diabetes mellitus without complication (HCC)    on insulin pump  . Dysrhythmia   . 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 Family history of breast cancer   . GERD (gastroesophageal reflux disease)   . History of blood transfusion   . History of bronchitis   . History of chemotherapy   . History of urinary tract infection   . Hyperlipidemia   . Hypertension   . Low serum vitamin  D   . Ovarian cancer ([place]) 05/09/2016  . Shingles     Patient Active Problem List   Diagnosis Date Noted  . Goals of care, counseling/discussion 05/14/2017  . Edema leg 12/18/2016  . Influenza with pneumonia 11/20/2016  . Sepsis ([place]) 11/20/2016  . Antineoplastic chemotherapy induced pancytopenia ([place]) 11/20/2016  . HCAP (healthcare-associated pneumonia) 11/19/2016  . Infection of urinary tract 10/14/2016  . B12 deficiency 08/31/2016  . Anemia due to antineoplastic chemotherapy 08/09/2016  . 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 Glaucoma 08/07/2016  . Genetic testing 06/27/2016  . Family history of breast cancer   . Shingles 05/09/2016  . 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 Nausea with vomiting 04/30/2016  . Sciatica neuralgia 02/12/2015  . Severe obesity (BMI >= 40) ([place]) 10/12/2014  . Hypertension 09/04/2014  . Obesity 09/04/2014  . Type 2 diabetes mellitus treated with insulin ([place]) 01/25/2014  . Vitamin D insufficiency 01/25/2014  . Hyperlipidemia 01/25/2014  . Osteopenia 01/25/2014    Past Surgical History:  Procedure Laterality Date  . ABDOMINAL HYSTERECTOMY  10/14/2016  . CESAREAN SECTION    . DEBULKING N/A 10/14/2016   Procedure: DEBULKING;  Surgeon: Everitt Amber, MD;  Location: [facility];  Service: Gynecology;  Laterality: N/A;  . IR PARACENTESIS  06/01/2017  . LAPAROTOMY WITH STAGING N/A 10/14/2016   Procedure: LAPAROTOMY WITH  OMENTECTOMY AND TUMOR DEBULGING;  Surgeon: Everitt Amber, MD;  Location: [facility];  Service: Gynecology;  Laterality: N/A;  . LUMBAR FUSION  08/21/15   L3-L4 Dr. Timmothy Euler  . OMENTECTOMY N/A 10/14/2016   Procedure: OMENTECTOMY;  Surgeon: Everitt Amber, MD;  Location: [facility];  Service: Gynecology;  Laterality: N/A;  . ROBOTIC ASSISTED TOTAL HYSTERECTOMY WITH BILATERAL SALPINGO OOPHERECTOMY Bilateral 10/14/2016   Procedure: XI ROBOTIC ASSISTED TOTAL LAPARSCOPIC  HYSTERECTOMY WITH BILATERAL SALPINGO OOPHORECTOMY;   Surgeon: Everitt Amber, MD;  Location: [facility];  Service: Gynecology;  Laterality: Bilateral;    OB History    No data available       Home Medications    Prior to Admission medications   Medication Sig Start Date End Date Taking? Authorizing Provider  atorvastatin (LIPITOR) 40 MG tablet Take 1 tablet (40 mg total) by mouth daily. 03/25/17   Eustaquio Maize, MD  benazepril (LOTENSIN) 10 MG tablet Take 1 tablet (10 mg total) by mouth daily. 03/25/17   Eustaquio Maize, MD  diazepam (VALIUM) 5 MG tablet Take 1 tablet (5 mg total) by mouth every 4 (four) hours as needed (spasms). 06/22/17   Heath Lark, MD  furosemide (LASIX) 20 MG tablet Take 1 tablet (20 mg total) by mouth 2 (two) times daily. 07/07/17   Heath Lark, MD  glucosamine-chondroitin 500-400 MG tablet Take 1 tablet by mouth 2 (two) times daily.    [provider]  HYDROcodone-acetaminophen (NORCO) 10-325 MG tablet Take 1 tablet by mouth every 4 (four) hours as needed. 07/31/17   Heath Lark, MD  lidocaine-prilocaine (EMLA) cream Apply a quarter size amount to port site 1 hour prior to chemo. Do not rub in. Cover with plastic wrap. 05/10/16   Pen[place], Kelby Fam, MD  metFORMIN (GLUCOPHAGE) 1000 MG tablet TAKE 1 TABLET BY MOUTH TWICE DAILY WITH A MEAL 03/25/17   Eustaquio Maize, MD  Multiple Vitamin (MULTIVITAMIN WITH MINERALS) TABS Take 1 tablet by mouth daily.    [provider]  multivitamin-lutein (OCUVITE-LUTEIN) CAPS capsule Take 1 capsule by mouth daily.    [provider]  NOVOLOG 100 UNIT/ML injection USE IN INSULIN PUMP AS DIRECTED. MAX DAILY DOSE OF 110 UNITS PER DAY 06/22/17   Eustaquio Maize, MD  omeprazole (PRILOSEC) 40 MG capsule  05/25/17   [provider]  ondansetron (ZOFRAN) 8 MG tablet Take by mouth every 8 (eight) hours as needed for nausea or vomiting.    [provider]  ONE TOUCH ULTRA TEST test strip USE TO CHECK BLOOD SUGAR UP TO 5 TIMES A DAY 12/26/16   [name], MD    polyethylene glycol [facility] / Floria Raveling) packet Take 17 g by mouth daily.    [provider]  senna (SENOKOT) 8.6 MG TABS tablet Take 1 tablet (8.6 mg total) by mouth at bedtime as needed for moderate constipation. 10/15/16   Everitt Amber, MD  vitamin B-12 (CYANOCOBALAMIN) 1000 MCG tablet Take 1,000 mcg by mouth daily.    [provider]  vitamin E (VITAMIN E) 400 UNIT capsule Take 400 Units by mouth daily.    [provider]    Family History Family History  Problem Relation Age of Onset  . Lung cancer Mother        smoker; dx in her 51s  . Leukemia Father   . Diabetes Paternal Grandmother   . Heart attack Paternal Grandmother   . Diabetes Paternal Grandfather   . Breast cancer Paternal Aunt  dx in her 20s-30s  . Leukemia Paternal Uncle   . Heart attack Maternal Grandfather   . Breast cancer Cousin        maternal first cousin    Social History Social History  Substance Use Topics  . Smoking status: Never Smoker  . Smokeless tobacco: Never Used  . Alcohol use No  lives at home Lives with spouse   Allergies   Avandia [rosiglitazone]; Micronase [glyburide]; and Actos [pioglitazone]   Review of Systems Review of Systems  All other systems reviewed and are negative.    Physical Exam Updated Vital Signs BP (!) 112/50 (BP Location: Right Arm)   Pulse 95   Temp 99.5 F (37.5 C) (Oral)   Resp 18   Ht 5\' 2"  (1.575 m)   Wt 95.3 kg (210 lb)   SpO2 94%   BMI 38.41 kg/m   Vital signs normal    Physical Exam  Constitutional: She is oriented to person, place, and time. She appears well-developed and well-nourished.  Non-toxic appearance. She does not appear ill. No distress.  HENT:  Head: Normocephalic and atraumatic.  Right Ear: External ear normal.  Left Ear: External ear normal.  Nose: Nose normal. No mucosal edema or rhinorrhea.  Mouth/Throat: Oropharynx is clear and moist and mucous membranes are normal. No dental abscesses or  uvula swelling.  Eyes: Pupils are equal, round, and reactive to light. Conjunctivae and EOM are normal.  Neck: Normal range of motion and full passive range of motion without pain. Neck supple.  Cardiovascular: Normal rate, regular rhythm and normal heart sounds.  Exam reveals no gallop and no friction rub.   No murmur heard. Pulmonary/Chest: Effort normal and breath sounds normal. No respiratory distress. She has no wheezes. She has no rhonchi. She has no rales. She exhibits no tenderness and no crepitus.  Pt's port site looks good.   Abdominal: Soft. Normal appearance and bowel sounds are normal. She exhibits no distension. There is no tenderness. There is no rebound and no guarding.  Musculoskeletal: Normal range of motion. She exhibits no edema or tenderness.  Moves all extremities well.   Neurological: She is alert and oriented to person, place, and time. She has normal strength. No cranial nerve deficit.  Skin: Skin is warm, dry and intact. No rash noted. No erythema. No pallor.  Psychiatric: She has a normal mood and affect. Her speech is normal and behavior is normal. Her mood appears not anxious.  Nursing note and vitals reviewed.    ED Treatments / Results  Labs (all labs ordered are listed, but only abnormal results are displayed) Results for orders placed or performed during the hospital encounter of 08/03/17  Comprehensive metabolic panel  Result Value Ref Range   Sodium 139 135 - 145 mmol/L   Potassium 5.2 (H) 3.5 - 5.1 mmol/L   Chloride 107 101 - 111 mmol/L   CO2 20 (L) 22 - 32 mmol/L   Glucose, Bld 128 (H) 65 - 99 mg/dL   BUN 58 (H) 6 - 20 mg/dL   Creatinine, Ser 2.04 (H) 0.44 - 1.00 mg/dL   Calcium 9.1 8.9 - 10.3 mg/dL   Total Protein 7.1 6.5 - 8.1 g/dL   Albumin 3.4 (L) 3.5 - 5.0 g/dL   AST 31 15 - 41 U/L   ALT 26 14 - 54 U/L   Alkaline Phosphatase 96 38 - 126 U/L   Total Bilirubin 0.4 0.3 - 1.2 mg/dL   GFR calc non Af Amer 25 (  L) >60 mL/min   GFR calc Af Amer  29 (L) >60 mL/min   Anion gap 12 5 - 15  CBC with Differential  Result Value Ref Range   WBC 8.2 4.0 - 10.5 K/uL   RBC 2.80 (L) 3.87 - 5.11 MIL/uL   Hemoglobin 8.4 (L) 12.0 - 15.0 g/dL   HCT 26.9 (L) 36.0 - 46.0 %   MCV 96.1 78.0 - 100.0 fL   MCH 30.0 26.0 - 34.0 pg   MCHC 31.2 30.0 - 36.0 g/dL   RDW 15.7 (H) 11.5 - 15.5 %   Platelets 172 150 - 400 K/uL   Neutrophils Relative % 95 %   Neutro Abs 7.8 (H) 1.7 - 7.7 K/uL   Lymphocytes Relative 4 %   Lymphs Abs 0.3 (L) 0.7 - 4.0 K/uL   Monocytes Relative 1 %   Monocytes Absolute 0.1 0.1 - 1.0 K/uL   Eosinophils Relative 0 %   Eosinophils Absolute 0.0 0.0 - 0.7 K/uL   Basophils Relative 0 %   Basophils Absolute 0.0 0.0 - 0.1 K/uL  Urinalysis, Routine w reflex microscopic  Result Value Ref Range   Color, Urine YELLOW YELLOW   APPearance HAZY (A) CLEAR   Specific Gravity, Urine 1.008 1.005 - 1.030   pH 5.0 5.0 - 8.0   Glucose, UA NEGATIVE NEGATIVE mg/dL   Hgb urine dipstick SMALL (A) NEGATIVE   Bilirubin Urine NEGATIVE NEGATIVE   Ketones, ur NEGATIVE NEGATIVE mg/dL   Protein, ur NEGATIVE NEGATIVE mg/dL   Nitrite NEGATIVE NEGATIVE   Leukocytes, UA LARGE (A) NEGATIVE   RBC / HPF 0-5 0 - 5 RBC/hpf   WBC, UA 6-30 0 - 5 WBC/hpf   Bacteria, UA MANY (A) NONE SEEN   Squamous Epithelial / LPF 0-5 (A) NONE SEEN   Mucus PRESENT    Hyaline Casts, UA PRESENT   I-Stat CG4 Lactic Acid, ED  Result Value Ref Range   Lactic Acid, Venous 2.41 (HH) 0.5 - 1.9 mmol/L   Comment NOTIFIED PHYSICIAN     Laboratory interpretation all normal except elevated LA, anemia, possible UTI, renal insufficiency  EKG  EKG Interpretation None       Radiology No results found.    Ct Chest W Contrast  Ct Abdomen Pelvis W Contrast  Result Date: 07/20/2017 CLINICAL DATA:  Followup ovarian and peritoneal carcinoma. Undergoing chemotherapy. Restaging. EXAM: CT CHEST, ABDOMEN, AND PELVIS WITH CONTRAST TECHNIQUE: Multidetector CT imaging of the chest,  abdomen and pelvis was performed following the standard protocol during bolus administration of intravenous contrast. CONTRAST:  119mL ISOVUE-300 IOPAMIDOL (ISOVUE-300) INJECTION 61% COMPARISON:  05/04/2017 FINDINGS: CT CHEST FINDINGS Cardiovascular: No acute findings. Mediastinum/Lymph Nodes: Stable tiny sub-cm thyroid nodules. No masses or pathologically enlarged lymph nodes identified. Lungs/Pleura: Mild ill-defined area of airspace opacity seen in the superior left lower lobe which is new since previous study, and consistent with inflammatory or infectious etiology. No suspicious pulmonary nodules identified. No evidence of pleural effusion. Musculoskeletal:  No suspicious bone lesions identified. CT ABDOMEN AND PELVIS FINDINGS Hepatobiliary: No masses identified. Gallbladder is unremarkable. Pancreas:  No mass or inflammatory changes. Spleen:  Within normal limits in size and appearance. Adrenals/Urinary tract: No masses or hydronephrosis. 2-3 mm nonobstructing calculus again seen in lower pole of left kidney. Stomach/Bowel:  No evidence of bowel obstruction. Vascular/Lymphatic: No pathologically enlarged lymph nodes identified. No abdominal aortic aneurysm. Aortic atherosclerosis. Reproductive: Prior hysterectomy noted. Mild increase in peritoneal soft tissue thickening seen in central pelvis. Mild increase in  soft tissue densities seen within the omental fat mainly in the left abdomen. New peritoneal soft tissue density seen in the gastrohepatic ligament, measuring 3.9 x 3.0 cm on image 54/2. Minimal ascites shows no significant change. Other:  None. Musculoskeletal:  No suspicious bone lesions identified. IMPRESSION: Mild increase in peritoneal carcinoma within abdomen pelvis since previous study. No significant change and minimal ascites. No evidence of metastatic disease within the thorax. New mild airspace opacity in left lower lobe, consistent with inflammatory or infectious etiology. Electronically  Signed   By: Earle Gell M.D.   On: 07/20/2017 11:46    Procedures Procedures (including critical care time)  Medications Ordered in ED Medications  ceFEPIme (MAXIPIME) 2 g in dextrose 5 % 50 mL IVPB (2 g Intravenous New Bag/Given 08/03/17 5726)  sodium chloride 0.9 % bolus 1,000 mL (0 mLs Intravenous Stopped 08/03/17 0641)  sodium chloride 0.9 % bolus 1,000 mL (1,000 mLs Intravenous New Bag/Given 08/03/17 0600)     Initial Impression / Assessment and Plan / ED Course  I have reviewed the triage vital signs and the nursing notes.  Pertinent labs & imaging results that were available during my care of the patient were reviewed by me and considered in my medical decision making (see chart for details).    Patient was given IV fluids. Blood cultures and urine culture was obtained. Patient does not have a cough.We discussed if her urine looked normal we would do a chest x-ray.  Patient was started on Maxipime before her CBC resulted in case she was neutropenic due to recent chemotherapy.  We discussed admission and patient her husband states that like to go to [facility] if possible.  6:30 AM Dr. Shanon Brow, hospitalist, states the day shift doctors will admit. She points out that [facility] is holding a lot of admissions. When I look there are 13 patients in the emergency department waiting for a bed.  The bed situation at [facility] was given to the patient. Her husband was out of the room. I will talk to him when he returns. It may be best that they stay here and then if she needs to have an oncology intervention be transferred at that point.  7:20 AM patient's husband is in the room.he was made aware of the bed situation at [facility]. He is also in agreement that she stay here.  Final Clinical Impressions(s) / ED Diagnoses   Final diagnoses:  Fever, unspecified fever cause  Urinary tract infection without hematuria, site unspecified   Plan admission  Rolland Porter, MD, Barbette Or, MD 08/03/17 [phone]

## 2017-08-04 ENCOUNTER — Telehealth: Payer: Self-pay

## 2017-08-04 ENCOUNTER — Inpatient Hospital Stay (HOSPITAL_COMMUNITY): Payer: BC Managed Care – PPO

## 2017-08-04 DIAGNOSIS — D696 Thrombocytopenia, unspecified: Secondary | ICD-10-CM | POA: Diagnosis present

## 2017-08-04 DIAGNOSIS — A419 Sepsis, unspecified organism: Secondary | ICD-10-CM | POA: Diagnosis present

## 2017-08-04 DIAGNOSIS — N179 Acute kidney failure, unspecified: Secondary | ICD-10-CM | POA: Diagnosis present

## 2017-08-04 DIAGNOSIS — N390 Urinary tract infection, site not specified: Secondary | ICD-10-CM

## 2017-08-04 DIAGNOSIS — D691 Qualitative platelet defects: Secondary | ICD-10-CM

## 2017-08-04 LAB — CBC
HCT: 22.4 % — ABNORMAL LOW (ref 36.0–46.0)
Hemoglobin: 6.8 g/dL — CL (ref 12.0–15.0)
MCH: 29.4 pg (ref 26.0–34.0)
MCHC: 30.4 g/dL (ref 30.0–36.0)
MCV: 97 fL (ref 78.0–100.0)
Platelets: 129 10*3/uL — ABNORMAL LOW (ref 150–400)
RBC: 2.31 MIL/uL — ABNORMAL LOW (ref 3.87–5.11)
RDW: 15.8 % — AB (ref 11.5–15.5)
WBC: 4.5 10*3/uL (ref 4.0–10.5)

## 2017-08-04 LAB — BASIC METABOLIC PANEL
Anion gap: 8 (ref 5–15)
BUN: 30 mg/dL — AB (ref 6–20)
CALCIUM: 8.4 mg/dL — AB (ref 8.9–10.3)
CO2: 20 mmol/L — ABNORMAL LOW (ref 22–32)
CREATININE: 1.22 mg/dL — AB (ref 0.44–1.00)
Chloride: 111 mmol/L (ref 101–111)
GFR calc Af Amer: 53 mL/min — ABNORMAL LOW (ref >60)
GFR calc non Af Amer: 46 mL/min — ABNORMAL LOW (ref >60)
GLUCOSE: 131 mg/dL — AB (ref 65–99)
Potassium: 4.5 mmol/L (ref 3.5–5.1)
Sodium: 139 mmol/L (ref 135–145)

## 2017-08-04 LAB — GLUCOSE, CAPILLARY
GLUCOSE-CAPILLARY: 141 mg/dL — AB (ref 65–99)
GLUCOSE-CAPILLARY: 75 mg/dL (ref 65–99)
Glucose-Capillary: 139 mg/dL — ABNORMAL HIGH (ref 65–99)
Glucose-Capillary: 130 mg/dL — ABNORMAL HIGH (ref 65–99)
Glucose-Capillary: 91 mg/dL (ref 65–99)

## 2017-08-04 LAB — PREPARE RBC (CROSSMATCH)

## 2017-08-04 MED ORDER — ENOXAPARIN SODIUM 40 MG/0.4ML ~~LOC~~ SOLN
40.0000 mg | SUBCUTANEOUS | Status: DC
  Administered 2017-08-04: 40 mg via SUBCUTANEOUS
  Filled 2017-08-04: qty 0.4

## 2017-08-04 MED ORDER — ONDANSETRON HCL 4 MG/2ML IJ SOLN
4.0000 mg | Freq: Four times a day (QID) | INTRAMUSCULAR | Status: DC | PRN
  Administered 2017-08-04 – 2017-08-05 (×2): 4 mg via INTRAVENOUS
  Filled 2017-08-04 (×2): qty 2

## 2017-08-04 MED ORDER — SODIUM CHLORIDE 0.9 % IV SOLN
Freq: Once | INTRAVENOUS | Status: AC
  Administered 2017-08-04: 11:00:00 via INTRAVENOUS

## 2017-08-04 MED ORDER — CALCIUM CARBONATE ANTACID 500 MG PO CHEW: 1.0000 | CHEWABLE_TABLET | Freq: Three times a day (TID) | ORAL | Status: DC | PRN

## 2017-08-04 NOTE — Progress Notes (Signed)
Pt's temp remains elevated at 101.1 following tylenol, MD aware, no new orders received.

## 2017-08-04 NOTE — Progress Notes (Signed)
Inpatient Diabetes Program Recommendations  AACE/ADA: New Consensus Statement on Inpatient Glycemic Control (2015)  Target Ranges:  Prepandial:   less than 140 mg/dL      Peak postprandial:   less than 180 mg/dL (1-2 hours)      Critically ill patients:  140 - 180 mg/dL   Results for Ellen Hunt, Ellen Hunt (MRN [id number]) as of 08/04/2017 11:50  Ref. Range 08/03/2017 10:25 08/03/2017 12:12 08/03/2017 16:38 08/03/2017 22:07 08/04/2017 02:09  Glucose-Capillary Latest Ref Range: 65 - 99 mg/dL 107 (H) 102 (H) 64 (L) 146 (H) 141 (H)   Review of Glycemic Control  Diabetes history: DM Outpatient Diabetes medications: Medtronic insulin pump with Novolog insulin Current orders for Inpatient glycemic control: Insulin Pump with CBGs ACHS&2am  Spoke with patient and her husband regarding diabetes and home regimen for diabetes management.  Patient states that she was being followed by Cherre Robins, PhD for diabetes management.  Patient reports that Cherre Robins, PhD took another job so she was referred to an Musician in [place] for DM management. Patient reports that she seen Endocrinologist 2 times and decided not to go back. Patient has set up an appointment with Dr. Cruzita Lederer (Endocrinologist) in [place] but has not had her initial appointment yet.  Patient uses a Medtronic Paradigm insulin pump with Novolog insulin as an outpatient. Patient has insulin pump attached and inside pocket of her gown. Patient states that she does not have any extra insulin pump supplies with her here at the hospital and she will need to get her husband bring extra insulin pump supplies here to the hospital so she will have if needed.   Current insulin pump settings are as follows:  Basal insulin  12A-8A  2.10 units/hour 8A-5P  2.15 units/hour 5P-12A 1.90 units/hour Total daily basal insulin: 49.45 units/24 hours  Carb Coverage 1:8 1 unit for every 8 grams of carbohydrates  Insulin Sensitivity 1:13 1 unit drops blood  glucose 13 mg/dl  Target Glucose Goals 12A 100-115 mg/dl  In talking with the patient she states that her blood glucose normally runs very good (ranging from 80-mid 100's mg/dl over the past week).  Noted in chart review, patient was having issues with hypoglycemia and Tammy Eckard provided her with insulin pump setting adjustments on 06/11/17 (which are current pump settings as noted above).  Patient reports that she changed out her infusion site and insulin reservoir and tubing this past weekend and she could not remember how to prime the pump. Patient's husband had to call the 1-800 number on the back of the pump for assistance with priming the tubing. Patient states that she thinks it was some of her medications that caused her to be a "loopy". Patient and her husband asked if nursing staff could help with new insulin pump infusion site insertion and putting information into her insulin pump if needed. Explained that nursing staff is not able to help because they are not competent in that skill and patient has to be responsible for their own pump management if using an insulin pump as an inpatient. Explained if patient is not able to operate her insulin pump, then the pump would have to be removed and SQ insulin injections ordered while inpatient. Patient prefers to use her insulin pump and feels she is able to operate her insulin pump appropriate. Patient requested diabetes coordinator could come and watch her change out infusion site, tubing, and reservoir on 08/05/17 if possible. Patient's husband will bring all new pump supplies to the hospital  to have available for tomorrow when site, tubing, and reservoir changed. Patient verbalized understanding of information discussed and states that she does not have any further questions related to diabetes at this time.  NURSING:  The insulin pump contract should be signed by the patient and then placed in the chart (if not already done). The patient insulin pump  flow sheet will be completed by the patient at the bedside and the RN caring for the patient will use the patient's flow sheet to document in the [facility]. RN will need to complete the Nursing Insulin Pump Flowsheet at least once a shift. Patient will need to keep extra insulin pump supplies at the bedside at all times. Will continue checking glucose ACHS & 2 am as ordered with the hospital glucometer.  Thanks, Barnie Alderman, RN, MSN, CDE Diabetes Coordinator Inpatient Diabetes Program [phone] (Team Pager from 8am to 5pm)

## 2017-08-04 NOTE — Telephone Encounter (Signed)
Patient called and left message that she was admitted to [facility] yesterday. Her hgb is 6.8 and she will get blood today.  Called back and spoke to husband, they wanted Dr. Alvy Bimler to know what is going on. Told that Dr. Alvy Bimler is aware now. Instructed to call for questions.

## 2017-08-04 NOTE — Progress Notes (Signed)
Pt's hemoglobin 6.8, Dr. Olevia Bowens aware, 1 unit PRBC's ordered, will make day RN aware.

## 2017-08-04 NOTE — Progress Notes (Addendum)
  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 Initial evaluation revealed intermittent hypotension, elevated lactic acid, left lower lobe pneumonia.  Assessment/Plan Early sepsis secondary to UTI with associated acute encephalopathy and lactic acidosis. -Despite equivocal urinalysis, cultures growing greater than 100,000 colonies of E coli.  -Initially treated empirically for pneumonia based on CT findings in September, however repeat chest x-ray today shows no evidence of infiltrate. -Discontinue vancomycin. -Continue to follow culture data.  Acute kidney injury with modest hyperkalemia -Resolving. Lasix on hold. Check BMP in a.m.  Anemia and thrombocytopenia presumably secondary to anti-neoplastic therapy -Transfuse 1 units PRBC today, recheck CBC in a.m. -SCDs  Extraovarian primary peritoneal carcinoma currently undergoing chemotherapy -Supportive care  Diabetes mellitus treated with insulin -continue pump per protocol, appreciate diabetes RN  DVT prophylaxis: SCDs Code Status: full Family Communication: husband at bedside Disposition Plan: home    Murray Hodgkins, MD  [facility] Direct contact: [phone] --[place]  --[email or web address]; password [email or web address]  7PM-7AM contact night coverage as above 08/04/2017, 4:53 PM  LOS: 1 day   Consultants:    Procedures:    Antimicrobials:  Cefepime 9/30 >>  Vancomycin 10/1 >>  Interval history/Subjective: Ate very well last night. Poor sleep. This morning had an episode of vomiting. Loose BM this AM. No abdominal pain. Complains of sore throat. Breathing okay.  Fever noted overnight.  Objective: Vitals:  Maximum temperature 102, 97.6, 18, 64, 110/52, 97% on room air  Exam:     Constitutional: Appears calm, mildly uncomfortable. Nontoxic.  Respiratory. Clear to auscultation bilaterally. No frank wheezes, rales or rhonchi. Normal respiratory effort.  Cardiovascular. Regular rate and rhythm. No murmur, rub or gallop. No lower extremity edema.  ENT. Lips and tongue, oropharynx appear unremarkable.  Abdomen. Soft, nontender.  Psychiatric. Grossly normal mood and affect. Speech fluent and appropriate   I have personally reviewed the following:   Labs:  1 episode of hypoglycemia noted yesterday. Blood sugar stable today.  Lactic acid is normalized  Hemoglobin has decreased, 6.8. Platelets down, 129. WBC 4.5.  Influenza PCR negative  BUN and creatinine trending down. Potassium within normal limits.  Imaging studies:  Chest x-ray and apparently reviewed, no acute disease   Scheduled Meds: . insulin pump   Subcutaneous TID AC, HS, 0200  . pantoprazole  40 mg Oral Daily   Continuous Infusions: . sodium chloride 75 mL/hr at 08/04/17 1240  . ceFEPime (MAXIPIME) IV Stopped (08/04/17 0556)    Principal Problem:   Sepsis secondary to UTI [facility]) Active Problems:   Anemia due to antineoplastic chemotherapy   Thrombocytopenia (HCC)   AKI (acute kidney injury) ([place])   LOS: 1 day

## 2017-08-05 ENCOUNTER — Ambulatory Visit: Payer: BC Managed Care – PPO

## 2017-08-05 ENCOUNTER — Other Ambulatory Visit: Payer: Self-pay | Admitting: Hematology and Oncology

## 2017-08-05 ENCOUNTER — Other Ambulatory Visit: Payer: BC Managed Care – PPO

## 2017-08-05 DIAGNOSIS — A419 Sepsis, unspecified organism: Secondary | ICD-10-CM

## 2017-08-05 DIAGNOSIS — C481 Malignant neoplasm of specified parts of peritoneum: Secondary | ICD-10-CM

## 2017-08-05 DIAGNOSIS — T451X5A Adverse effect of antineoplastic and immunosuppressive drugs, initial encounter: Secondary | ICD-10-CM

## 2017-08-05 DIAGNOSIS — N390 Urinary tract infection, site not specified: Secondary | ICD-10-CM | POA: Diagnosis present

## 2017-08-05 DIAGNOSIS — N179 Acute kidney failure, unspecified: Secondary | ICD-10-CM

## 2017-08-05 DIAGNOSIS — D6481 Anemia due to antineoplastic chemotherapy: Secondary | ICD-10-CM

## 2017-08-05 DIAGNOSIS — B9620 Unspecified Escherichia coli [E. coli] as the cause of diseases classified elsewhere: Secondary | ICD-10-CM

## 2017-08-05 DIAGNOSIS — D696 Thrombocytopenia, unspecified: Secondary | ICD-10-CM

## 2017-08-05 LAB — CBC
HEMATOCRIT: 25.5 % — AB (ref 36.0–46.0)
Hemoglobin: 7.8 g/dL — ABNORMAL LOW (ref 12.0–15.0)
MCH: 30 pg (ref 26.0–34.0)
MCHC: 30.6 g/dL (ref 30.0–36.0)
MCV: 98.1 fL (ref 78.0–100.0)
Platelets: 108 10*3/uL — ABNORMAL LOW (ref 150–400)
RBC: 2.6 MIL/uL — AB (ref 3.87–5.11)
RDW: 15.2 % (ref 11.5–15.5)
WBC: 2.3 10*3/uL — AB (ref 4.0–10.5)

## 2017-08-05 LAB — BASIC METABOLIC PANEL
Anion gap: 5 (ref 5–15)
BUN: 16 mg/dL (ref 6–20)
CHLORIDE: 114 mmol/L — AB (ref 101–111)
CO2: 21 mmol/L — AB (ref 22–32)
CREATININE: 0.98 mg/dL (ref 0.44–1.00)
Calcium: 8.7 mg/dL — ABNORMAL LOW (ref 8.9–10.3)
GFR calc Af Amer: >60 mL/min (ref >60)
GFR calc non Af Amer: 60 mL/min — ABNORMAL LOW (ref >60)
Glucose, Bld: 107 mg/dL — ABNORMAL HIGH (ref 65–99)
Potassium: 4.3 mmol/L (ref 3.5–5.1)
Sodium: 140 mmol/L (ref 135–145)

## 2017-08-05 LAB — TYPE AND SCREEN
ABO/RH(D): O POS
ANTIBODY SCREEN: NEGATIVE
UNIT DIVISION: 0

## 2017-08-05 LAB — BPAM RBC
BLOOD PRODUCT EXPIRATION DATE: 201810092359
ISSUE DATE / TIME: 201810021128
UNIT TYPE AND RH: 5100

## 2017-08-05 LAB — HIV ANTIBODY (ROUTINE TESTING W REFLEX): HIV SCREEN 4TH GENERATION: NONREACTIVE

## 2017-08-05 LAB — GLUCOSE, CAPILLARY
GLUCOSE-CAPILLARY: 92 mg/dL (ref 65–99)
Glucose-Capillary: 89 mg/dL (ref 65–99)
Glucose-Capillary: 99 mg/dL (ref 65–99)

## 2017-08-05 LAB — URINE CULTURE: Culture: >=100000 — AB

## 2017-08-05 MED ORDER — CEPHALEXIN 500 MG PO CAPS
500.0000 mg | ORAL_CAPSULE | Freq: Two times a day (BID) | ORAL | Status: DC
  Administered 2017-08-05: 500 mg via ORAL
  Filled 2017-08-05 (×2): qty 1

## 2017-08-05 MED ORDER — HEPARIN SOD (PORK) LOCK FLUSH 100 UNIT/ML IV SOLN: 500.0000 [IU] | INTRAVENOUS | Status: DC | PRN

## 2017-08-05 MED ORDER — HEPARIN SOD (PORK) LOCK FLUSH 100 UNIT/ML IV SOLN
500.0000 [IU] | INTRAVENOUS | Status: DC
  Filled 2017-08-05: qty 5

## 2017-08-05 MED ORDER — LOPERAMIDE HCL 2 MG PO TABS: 2.0000 mg | ORAL_TABLET | Freq: Four times a day (QID) | ORAL | 0 refills | Status: DC | PRN

## 2017-08-05 MED ORDER — CEPHALEXIN 500 MG PO CAPS: 500.0000 mg | ORAL_CAPSULE | Freq: Two times a day (BID) | ORAL | 0 refills | Status: AC

## 2017-08-05 NOTE — Progress Notes (Signed)
Discharge instructions read to patient and her husband. Both verbalized understanding of instructions. Discharged to home with husband. 

## 2017-08-05 NOTE — Care Management Note (Signed)
Case Management Note  Patient Details  Name: Ellen Hunt MRN: [id number] Date of Birth: [birth year]  Subjective/Objective:                  Admitted with UTI, immunosuppressed from chemo. Pt from home with husband, is ind with ADL's. He has PCP and follows closely with oncology. She has no DME or HH needs pta. She communicates no needs or concerns about DC at this time.   Action/Plan: DC home with self care today. Has appointment with onc tomorrow.   Expected Discharge Date:  08/05/17               Expected Discharge Plan:  Home/Self Care  In-House Referral:  NA  Discharge planning Services  CM Consult  Post Acute Care Choice:  NA Choice offered to:  NA  Status of Service:  Completed, signed off  Sherald Barge, RN 08/05/2017, 1:49 PM

## 2017-08-05 NOTE — Discharge Summary (Signed)
Physician Discharge Summary  Ellen Hunt EUM:[id number] DOB: 1951-10-25 DOA: 08/03/2017  PCP: Eustaquio Maize, MD  Admit date: 08/03/2017 Discharge date: 08/05/2017  Admitted From:Home Disposition:  Home  Recommendations for Outpatient Follow-up:  1. Follow up with PCP in 1-2 weeks. Patient will complete total seven-day course of antibiotics on 10/9. Please monitor CBC during outpatient follow-up. 2. 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 Patient received chemotherapy on 9/27 and on 9/29-9/30 she developed flulike symptoms with myalgia and confusion. She was unable to use her insulin pump that she has been on for 20 years. She had a fever of 102 at home. On presentation she was hypotensive with elevated lactic acid. A UA was positive for UTI. Admitted for early sepsis with acute encephalopathy and lactic acidosis.  Hospital course Early sepsis secondary to UTI Associated acute encephalopathy and lactic acidosis. Sepsis resolved. Placed on empiric vancomycin and cefepime, narrowed to oral Keflex today based on urine culture growing Escherichia coli sensitive to cephalosporin (resistant to penicillin and quinolones). Will treat with total seven-day course of  antibiotics.  Acute kidney injury with hyperkalemia Lasix held and given IV fluids. Renal function normalized. Resume Lasix and ACE inhibitor upon discharge.  Chemotherapy induced Pancytopenia Hemoglobin dropped to 6.8 and transfusion 1 unit PRBC with improvement to 7.8 today. Low platelets and WBC but stable.  Extraovarian primary peritoneal carcinoma Currently undergoing chemotherapy. Next chemotherapy is scheduled for tomorrow. I spoke with her oncologist Dr. Alvy Bimler who recommended that her chemotherapy will be postponed given her sepsis and office will call her with an appointment next week. Patient and her husband notified.  Diabetes mellitus Continue home dose insulin.  Acute metabolic encephalopathy Secondary to sepsis and patient being on high-dose Valium (5 mg 1-2 times daily scheduled. Patient reports that this was making her very confused and wobbly. She informs that she does not get anxious and that she has not been really needing it. Will discontinue Valium altogether.  Hyperlipidemia Continue statin.  Essential hypertension Blood pressure medication held on admission due to hypotension. Now stable. Resume Lasix and ACE inhibitor.  Diarrhea Reports passing 3-4 episodes of soft stools since this morning. Possibly due to antibiotics. Has Lomotil as home that can be continued.   Disposition: Home  Family communication: Husband at bedside  Consults: Discussed with her oncologist Dr. Alvy Bimler.  Discharge Instructions   Allergies as of 08/05/2017      Reactions   Avandia [rosiglitazone] Other (See Comments)   Legs swelled   Micronase [glyburide] Swelling   Actos [pioglitazone] Other (See Comments)   Edema / leg swelling      Medication List    STOP taking these medications   diazepam 5 MG tablet Commonly known as:  VALIUM     TAKE these medications   atorvastatin 40 MG tablet Commonly known as:  LIPITOR Take 1  tablet (40 mg total) by mouth daily.    benazepril 10 MG tablet Commonly known as:  LOTENSIN Take 1 tablet (10 mg total) by mouth daily.   cephALEXin 500 MG capsule Commonly known as:  KEFLEX Take 1 capsule (500 mg total) by mouth every 12 (twelve) hours.   furosemide 20 MG tablet Commonly known as:  LASIX Take 1 tablet (20 mg total) by mouth 2 (two) times daily.   HYDROcodone-acetaminophen 10-325 MG tablet Commonly known as:  NORCO Take 1 tablet by mouth every 4 (four) hours as needed.   lidocaine-prilocaine cream Commonly known as:  EMLA Apply a quarter size amount to port site 1 hour prior to chemo. Do not rub in. Cover with plastic wrap.   loperamide 2 MG tablet Commonly known as:  IMODIUM A-D Take 1 tablet (2 mg total) by mouth 4 (four) times daily as needed for diarrhea or loose stools.   metFORMIN 1000 MG tablet Commonly known as:  GLUCOPHAGE TAKE 1 TABLET BY MOUTH TWICE DAILY WITH A MEAL   multivitamin with minerals Tabs tablet Take 1 tablet by mouth daily.   multivitamin-lutein Caps capsule Take 1 capsule by mouth daily.   NOVOLOG 100 UNIT/ML injection Generic drug:  insulin aspart USE IN INSULIN PUMP AS DIRECTED. MAX DAILY DOSE OF 110 UNITS PER DAY   omeprazole 40 MG capsule Commonly known as:  PRILOSEC Take 40 mg by mouth daily.   ondansetron 8 MG tablet Commonly known as:  ZOFRAN Take by mouth every 8 (eight) hours as needed for nausea or vomiting.   ONE TOUCH ULTRA TEST test strip Generic drug:  glucose blood USE TO CHECK BLOOD SUGAR UP TO 5 TIMES A DAY   polyethylene glycol packet Commonly known as:  MIRALAX / GLYCOLAX Take 17 g by mouth daily as needed for mild constipation.   senna 8.6 MG Tabs tablet Commonly known as:  SENOKOT Take 1 tablet (8.6 mg total) by mouth at bedtime as needed for moderate constipation.   vitamin B-12 1000 MCG tablet Commonly known as:  CYANOCOBALAMIN Take 1,000 mcg by mouth daily.   vitamin E 400 UNIT capsule Generic drug:  vitamin E Take 400 Units  by mouth daily.      Follow-up Information    Eustaquio Maize, MD. Schedule an appointment as soon as possible for a visit in 1 week(s).   Specialty:  Pediatrics Contact information: [place] [address] [phone]        Heath Lark, MD Follow up.   Specialty:  Hematology and Oncology Why:  office will call for appt Contact information: [place] [phone] [phone]          Allergies  Allergen Reactions  . Avandia [Rosiglitazone] Other (See Comments)    Legs swelled  . Micronase [Glyburide] Swelling  . Actos [Pioglitazone] Other (See Comments)    Edema / leg swelling       Procedures/Studies: Dg Chest 2 View  Result Date: 08/04/2017 CLINICAL DATA:  Shortness of breath and pneumonia. EXAM: CHEST  2 VIEW COMPARISON:  11/19/2016 and CT of the chest on 07/20/2017 FINDINGS: Port-A-Cath positioning stable with the catheter tip in the lower SVC. There is chronic elevation of the right hemidiaphragm and some chronic scarring at the right lung base. There is no evidence of pulmonary edema, consolidation, pneumothorax, nodule or pleural fluid. The heart size is normal. No overt pulmonary edema. No pleural effusions identified. Bony structures are unremarkable. IMPRESSION: Stable  scarring at the right lung base. No acute airspace disease identified. Electronically Signed   By: Aletta Edouard M.D.   On: 08/04/2017 16:39   Ct Chest W Contrast  Result Date: 07/20/2017 CLINICAL DATA:  Followup ovarian and peritoneal carcinoma. Undergoing chemotherapy. Restaging. EXAM: CT CHEST, ABDOMEN, AND PELVIS WITH CONTRAST TECHNIQUE: Multidetector CT imaging of the chest, abdomen and pelvis was performed following the standard protocol during bolus administration of intravenous contrast. CONTRAST:  173mL ISOVUE-300 IOPAMIDOL (ISOVUE-300) INJECTION 61% COMPARISON:  05/04/2017 FINDINGS: CT CHEST FINDINGS Cardiovascular: No acute findings.  Mediastinum/Lymph Nodes: Stable tiny sub-cm thyroid nodules. No masses or pathologically enlarged lymph nodes identified. Lungs/Pleura: Mild ill-defined area of airspace opacity seen in the superior left lower lobe which is new since previous study, and consistent with inflammatory or infectious etiology. No suspicious pulmonary nodules identified. No evidence of pleural effusion. Musculoskeletal:  No suspicious bone lesions identified. CT ABDOMEN AND PELVIS FINDINGS Hepatobiliary: No masses identified. Gallbladder is unremarkable. Pancreas:  No mass or inflammatory changes. Spleen:  Within normal limits in size and appearance. Adrenals/Urinary tract: No masses or hydronephrosis. 2-3 mm nonobstructing calculus again seen in lower pole of left kidney. Stomach/Bowel:  No evidence of bowel obstruction. Vascular/Lymphatic: No pathologically enlarged lymph nodes identified. No abdominal aortic aneurysm. Aortic atherosclerosis. Reproductive: Prior hysterectomy noted. Mild increase in peritoneal soft tissue thickening seen in central pelvis. Mild increase in soft tissue densities seen within the omental fat mainly in the left abdomen. New peritoneal soft tissue density seen in the gastrohepatic ligament, measuring 3.9 x 3.0 cm on image 54/2. Minimal ascites shows no significant change. Other:  None. Musculoskeletal:  No suspicious bone lesions identified. IMPRESSION: Mild increase in peritoneal carcinoma within abdomen pelvis since previous study. No significant change and minimal ascites. No evidence of metastatic disease within the thorax. New mild airspace opacity in left lower lobe, consistent with inflammatory or infectious etiology. Electronically Signed   By: Earle Gell M.D.   On: 07/20/2017 11:46   Ct Abdomen Pelvis W Contrast  Result Date: 07/20/2017 CLINICAL DATA:  Followup ovarian and peritoneal carcinoma. Undergoing chemotherapy. Restaging. EXAM: CT CHEST, ABDOMEN, AND PELVIS WITH CONTRAST TECHNIQUE:  Multidetector CT imaging of the chest, abdomen and pelvis was performed following the standard protocol during bolus administration of intravenous contrast. CONTRAST:  156mL ISOVUE-300 IOPAMIDOL (ISOVUE-300) INJECTION 61% COMPARISON:  05/04/2017 FINDINGS: CT CHEST FINDINGS Cardiovascular: No acute findings. Mediastinum/Lymph Nodes: Stable tiny sub-cm thyroid nodules. No masses or pathologically enlarged lymph nodes identified. Lungs/Pleura: Mild ill-defined area of airspace opacity seen in the superior left lower lobe which is new since previous study, and consistent with inflammatory or infectious etiology. No suspicious pulmonary nodules identified. No evidence of pleural effusion. Musculoskeletal:  No suspicious bone lesions identified. CT ABDOMEN AND PELVIS FINDINGS Hepatobiliary: No masses identified. Gallbladder is unremarkable. Pancreas:  No mass or inflammatory changes. Spleen:  Within normal limits in size and appearance. Adrenals/Urinary tract: No masses or hydronephrosis. 2-3 mm nonobstructing calculus again seen in lower pole of left kidney. Stomach/Bowel:  No evidence of bowel obstruction. Vascular/Lymphatic: No pathologically enlarged lymph nodes identified. No abdominal aortic aneurysm. Aortic atherosclerosis. Reproductive: Prior hysterectomy noted. Mild increase in peritoneal soft tissue thickening seen in central pelvis. Mild increase in soft tissue densities seen within the omental fat mainly in the left abdomen. New peritoneal soft tissue density seen in the gastrohepatic ligament, measuring 3.9 x 3.0 cm on image 54/2. Minimal ascites shows no significant change. Other:  None. Musculoskeletal:  No suspicious bone  lesions identified. IMPRESSION: Mild increase in peritoneal carcinoma within abdomen pelvis since previous study. No significant change and minimal ascites. No evidence of metastatic disease within the thorax. New mild airspace opacity in left lower lobe, consistent with inflammatory or  infectious etiology. Electronically Signed   By: Earle Gell M.D.   On: 07/20/2017 11:46     Subjective: Reports having 3-4 episodes of soft stools this morning. No blood. Is better overall.  Discharge Exam: Vitals:   08/04/17 2151 08/05/17 0520  BP: (!) 119/48 140/61  Pulse: 65 68  Resp: 18 20  Temp: 98.2 F (36.8 C) 97.7 F (36.5 C)  SpO2: 98% 98%   Vitals:   08/04/17 1409 08/04/17 1509 08/04/17 2151 08/05/17 0520  BP: (!) 112/51 (!) 110/52 (!) 119/48 140/61  Pulse: 63 64 65 68  Resp: 18 18 18 20   Temp: 97.8 F (36.6 C) 97.6 F (36.4 C) 98.2 F (36.8 C) 97.7 F (36.5 C)  TempSrc: Oral  Oral Oral  SpO2: 97% 97% 98% 98%  Weight:      Height:        General:Middle aged female not in distress HEENT: Pallor present, moist oral mucosa, supple neck Chest: Clear to auscultation bilaterally CVS: Normal S1 and S2, no murmurs or gallop GI: Soft, nondistended, nontender, bowel sounds present Musculoskeletal: Warm, no edema CNS: Alert and oriented     The results of significant diagnostics from this hospitalization (including imaging, microbiology, ancillary and laboratory) are listed below for reference.     Microbiology: Recent Results (from the past 240 hour(s))  Urine culture     Status: Abnormal   Collection Time: 08/03/17  4:16 AM  Result Value Ref Range Status   Specimen Description URINE, CLEAN CATCH  Final   Special Requests Immunocompromised  Final   Culture >=100,000 COLONIES/mL ESCHERICHIA COLI (A)  Final   Report Status 08/05/2017 FINAL  Final   Organism ID, Bacteria ESCHERICHIA COLI (A)  Final      Susceptibility   Escherichia coli - MIC*    AMPICILLIN >=32 RESISTANT Resistant     CEFAZOLIN <=4 SENSITIVE Sensitive     CEFTRIAXONE <=1 SENSITIVE Sensitive     CIPROFLOXACIN >=4 RESISTANT Resistant     GENTAMICIN <=1 SENSITIVE Sensitive     IMIPENEM <=0.25 SENSITIVE Sensitive     NITROFURANTOIN <=16 SENSITIVE Sensitive     TRIMETH/SULFA >=320  RESISTANT Resistant     AMPICILLIN/SULBACTAM >=32 RESISTANT Resistant     PIP/TAZO <=4 SENSITIVE Sensitive     Extended ESBL NEGATIVE Sensitive     * >=100,000 COLONIES/mL ESCHERICHIA COLI  Culture, blood (routine x 2)     Status: None (Preliminary result)   Collection Time: 08/03/17  4:36 AM  Result Value Ref Range Status   Specimen Description BLOOD LEFT HAND  Final   Special Requests   Final    BOTTLES DRAWN AEROBIC AND ANAEROBIC Blood Culture adequate volume   Culture NO GROWTH 2 DAYS  Final   Report Status PENDING  Incomplete  Culture, blood (routine x 2)     Status: None (Preliminary result)   Collection Time: 08/03/17  4:37 AM  Result Value Ref Range Status   Specimen Description BLOOD RIGHT HAND  Final   Special Requests   Final    BOTTLES DRAWN AEROBIC AND ANAEROBIC Blood Culture adequate volume   Culture NO GROWTH 2 DAYS  Final   Report Status PENDING  Incomplete  Culture, blood (single)     Status: None (  Preliminary result)   Collection Time: 08/03/17  6:30 AM  Result Value Ref Range Status   Specimen Description PORTA CATH DRAWN BY RN  Final   Special Requests   Final    BOTTLES DRAWN AEROBIC AND ANAEROBIC Blood Culture adequate volume   Culture NO GROWTH 2 DAYS  Final   Report Status PENDING  Incomplete     Labs: BNP (last 3 results) No results for input(s): BNP in the last 8760 hours. Basic Metabolic Panel:  Recent Labs Lab 07/30/17 0938 08/03/17 0437 08/04/17 0438 08/05/17 0448  NA 144 139 139 140  K 4.3 5.2* 4.5 4.3  CL  --  107 111 114*  CO2 25 20* 20* 21*  GLUCOSE 101 128* 131* 107*  BUN 29.4* 58* 30* 16  CREATININE 1.0 2.04* 1.22* 0.98  CALCIUM 9.7 9.1 8.4* 8.7*   Liver Function Tests:  Recent Labs Lab 07/30/17 0938 08/03/17 0437  AST 20 31  ALT 16 26  ALKPHOS 128 96  BILITOT 0.26 0.4  PROT 7.3 7.1  ALBUMIN 3.5 3.4*   No results for input(s): LIPASE, AMYLASE in the last 168 hours. No results for input(s): AMMONIA in the last 168  hours. CBC:  Recent Labs Lab 07/30/17 0937 07/30/17 0938 08/03/17 0437 08/04/17 0438 08/05/17 0448  WBC  --  3.5* 8.2 4.5 2.3*  NEUTROABS  --  2.3 7.8*  --   --   HGB  --  9.4* 8.4* 6.8* 7.8*  HCT 29.7* 30.7* 26.9* 22.4* 25.5*  MCV  --  97.2 96.1 97.0 98.1  PLT  --  169 172 129* 108*   Cardiac Enzymes: No results for input(s): CKTOTAL, CKMB, CKMBINDEX, TROPONINI in the last 168 hours. BNP: Invalid input(s): POCBNP CBG:  Recent Labs Lab 08/04/17 2122 08/04/17 2239 08/05/17 0529 08/05/17 0749 08/05/17 1132  GLUCAP 75 91 89 92 99   D-Dimer No results for input(s): DDIMER in the last 72 hours. Hgb A1c No results for input(s): HGBA1C in the last 72 hours. Lipid Profile No results for input(s): CHOL, HDL, LDLCALC, TRIG, CHOLHDL, LDLDIRECT in the last 72 hours. Thyroid function studies No results for input(s): TSH, T4TOTAL, T3FREE, THYROIDAB in the last 72 hours.  Invalid input(s): FREET3 Anemia work up No results for input(s): VITAMINB12, FOLATE, FERRITIN, TIBC, IRON, RETICCTPCT in the last 72 hours. Urinalysis    Component Value Date/Time   COLORURINE YELLOW 08/03/2017 0416   APPEARANCEUR HAZY (A) 08/03/2017 0416   LABSPEC 1.008 08/03/2017 0416   PHURINE 5.0 08/03/2017 0416   GLUCOSEU NEGATIVE 08/03/2017 0416   HGBUR SMALL (A) 08/03/2017 0416   BILIRUBINUR NEGATIVE 08/03/2017 0416   KETONESUR NEGATIVE 08/03/2017 0416   PROTEINUR NEGATIVE 08/03/2017 0416   NITRITE NEGATIVE 08/03/2017 0416   LEUKOCYTESUR LARGE (A) 08/03/2017 0416   Sepsis Labs Invalid input(s): PROCALCITONIN,  WBC,  LACTICIDVEN Microbiology Recent Results (from the past 240 hour(s))  Urine culture     Status: Abnormal   Collection Time: 08/03/17  4:16 AM  Result Value Ref Range Status   Specimen Description URINE, CLEAN CATCH  Final   Special Requests Immunocompromised  Final   Culture >=100,000 COLONIES/mL ESCHERICHIA COLI (A)  Final   Report Status 08/05/2017 FINAL  Final   Organism  ID, Bacteria ESCHERICHIA COLI (A)  Final      Susceptibility   Escherichia coli - MIC*    AMPICILLIN >=32 RESISTANT Resistant     CEFAZOLIN <=4 SENSITIVE Sensitive     CEFTRIAXONE <=1 SENSITIVE Sensitive  CIPROFLOXACIN >=4 RESISTANT Resistant     GENTAMICIN <=1 SENSITIVE Sensitive     IMIPENEM <=0.25 SENSITIVE Sensitive     NITROFURANTOIN <=16 SENSITIVE Sensitive     TRIMETH/SULFA >=320 RESISTANT Resistant     AMPICILLIN/SULBACTAM >=32 RESISTANT Resistant     PIP/TAZO <=4 SENSITIVE Sensitive     Extended ESBL NEGATIVE Sensitive     * >=100,000 COLONIES/mL ESCHERICHIA COLI  Culture, blood (routine x 2)     Status: None (Preliminary result)   Collection Time: 08/03/17  4:36 AM  Result Value Ref Range Status   Specimen Description BLOOD LEFT HAND  Final   Special Requests   Final    BOTTLES DRAWN AEROBIC AND ANAEROBIC Blood Culture adequate volume   Culture NO GROWTH 2 DAYS  Final   Report Status PENDING  Incomplete  Culture, blood (routine x 2)     Status: None (Preliminary result)   Collection Time: 08/03/17  4:37 AM  Result Value Ref Range Status   Specimen Description BLOOD RIGHT HAND  Final   Special Requests   Final    BOTTLES DRAWN AEROBIC AND ANAEROBIC Blood Culture adequate volume   Culture NO GROWTH 2 DAYS  Final   Report Status PENDING  Incomplete  Culture, blood (single)     Status: None (Preliminary result)   Collection Time: 08/03/17  6:30 AM  Result Value Ref Range Status   Specimen Description PORTA CATH DRAWN BY RN  Final   Special Requests   Final    BOTTLES DRAWN AEROBIC AND ANAEROBIC Blood Culture adequate volume   Culture NO GROWTH 2 DAYS  Final   Report Status PENDING  Incomplete     Time coordinating discharge: Over 30 minutes  SIGNED:   Louellen Molder, MD  [facility] 08/05/2017, 12:59 PM Pager   If 7PM-7AM, please contact night-coverage [email or web address] Password [email or web address]

## 2017-08-05 NOTE — Progress Notes (Signed)
Inpatient Diabetes Program Recommendations  AACE/ADA: New Consensus Statement on Inpatient Glycemic Control (2015)  Target Ranges:  Prepandial:   less than 140 mg/dL      Peak postprandial:   less than 180 mg/dL (1-2 hours)      Critically ill patients:  140 - 180 mg/dL    Review of Glycemic Control  Diabetes history: DM Outpatient Diabetes medications: Medtronic insulin pump with Novolog insulin Current orders for Inpatient glycemic control: Insulin Pump with CBGs ACHS&2am  NOTE: Met with patient and her husband to watch patient change out insulin reservoir and insulin pump tubing. Patient states that she is being discharged home today she does not want to insert a new insulin pump site until after she goes home and takes a shower today. However, patient requested diabetes coordinator watch her change out the reservoir and tubing to make sure she was doing it correctly. Patient has all supplies needed at bedside and was able to fill reservoir and prime tubing with minimal input from diabetes coordinator. Patient's husband was also present and able to visualize appropriate technique for both. Patient states that she knows she knows how to do everything but lately she feels like she is having trouble changing everything out as far as remembering the correct steps. Inquired about user manual for insulin pump and patient reports that they have looked for the manual but not able to find it at home. Encouraged patient and her husband to look online and see if the user manual can be found and if so they could print and have it available to refer back to if needed. Also encouraged them to use the 1-800 number on the back of the pump if she had issues in the future. Patient verbalized understanding and has no questions or concerns at this time.  Thanks, Barnie Alderman, RN, MSN, CDE Diabetes Coordinator Inpatient Diabetes Program [phone] (Team Pager from 8am to 5pm)

## 2017-08-06 ENCOUNTER — Other Ambulatory Visit: Payer: BC Managed Care – PPO

## 2017-08-06 ENCOUNTER — Telehealth: Payer: Self-pay

## 2017-08-06 ENCOUNTER — Ambulatory Visit: Payer: BC Managed Care – PPO

## 2017-08-06 ENCOUNTER — Telehealth: Payer: Self-pay | Admitting: Hematology and Oncology

## 2017-08-06 ENCOUNTER — Ambulatory Visit: Payer: BC Managed Care – PPO | Admitting: Hematology and Oncology

## 2017-08-06 NOTE — Telephone Encounter (Signed)
Patient called and left message. She got discharged from the hospital yesterday and is concerned about hemoglobin of 7.8. Called patient back per Dr. Alvy Bimler and given appt for tomorrow for lab/flush and possible blood transfusion. Patient verbalized understanding.

## 2017-08-06 NOTE — Telephone Encounter (Signed)
Scheduled appt per 10/3 sch msg.  °

## 2017-08-07 ENCOUNTER — Ambulatory Visit (HOSPITAL_BASED_OUTPATIENT_CLINIC_OR_DEPARTMENT_OTHER): Payer: BC Managed Care – PPO

## 2017-08-07 ENCOUNTER — Other Ambulatory Visit (HOSPITAL_BASED_OUTPATIENT_CLINIC_OR_DEPARTMENT_OTHER): Payer: BC Managed Care – PPO

## 2017-08-07 ENCOUNTER — Telehealth: Payer: Self-pay | Admitting: Hematology and Oncology

## 2017-08-07 VITALS — BP 131/58 | HR 68 | Temp 97.9°F | Resp 20

## 2017-08-07 DIAGNOSIS — E538 Deficiency of other specified B group vitamins: Secondary | ICD-10-CM

## 2017-08-07 DIAGNOSIS — C786 Secondary malignant neoplasm of retroperitoneum and peritoneum: Secondary | ICD-10-CM

## 2017-08-07 DIAGNOSIS — C481 Malignant neoplasm of specified parts of peritoneum: Secondary | ICD-10-CM

## 2017-08-07 DIAGNOSIS — D6481 Anemia due to antineoplastic chemotherapy: Secondary | ICD-10-CM

## 2017-08-07 DIAGNOSIS — T451X5A Adverse effect of antineoplastic and immunosuppressive drugs, initial encounter: Secondary | ICD-10-CM

## 2017-08-07 LAB — CBC WITH DIFFERENTIAL/PLATELET
BASO%: 0.4 % (ref 0.0–2.0)
Basophils Absolute: 0 10*3/uL (ref 0.0–0.1)
EOS%: 3.5 % (ref 0.0–7.0)
Eosinophils Absolute: 0.1 10*3/uL (ref 0.0–0.5)
HCT: 27.9 % — ABNORMAL LOW (ref 34.8–46.6)
HGB: 9.1 g/dL — ABNORMAL LOW (ref 11.6–15.9)
LYMPH%: 10.6 % — ABNORMAL LOW (ref 14.0–49.7)
MCH: 30 pg (ref 25.1–34.0)
MCHC: 32.5 g/dL (ref 31.5–36.0)
MCV: 92.4 fL (ref 79.5–101.0)
MONO#: 0.3 10*3/uL (ref 0.1–0.9)
MONO%: 11.5 % (ref 0.0–14.0)
NEUT#: 1.8 10*3/uL (ref 1.5–6.5)
NEUT%: 74 % (ref 38.4–76.8)
Platelets: 82 10*3/uL — ABNORMAL LOW (ref 145–400)
RBC: 3.02 10*6/uL — ABNORMAL LOW (ref 3.70–5.45)
RDW: 16.2 % — ABNORMAL HIGH (ref 11.2–14.5)
WBC: 2.5 10*3/uL — ABNORMAL LOW (ref 3.9–10.3)
lymph#: 0.3 10*3/uL — ABNORMAL LOW (ref 0.9–3.3)

## 2017-08-07 LAB — COMPREHENSIVE METABOLIC PANEL
ALBUMIN: 3 g/dL — AB (ref 3.5–5.0)
ALT: 40 U/L (ref 0–55)
ANION GAP: 11 meq/L (ref 3–11)
AST: 27 U/L (ref 5–34)
Alkaline Phosphatase: 108 U/L (ref 40–150)
BILIRUBIN TOTAL: 0.29 mg/dL (ref 0.20–1.20)
BUN: 13.5 mg/dL (ref 7.0–26.0)
CO2: 23 meq/L (ref 22–29)
CREATININE: 1 mg/dL (ref 0.6–1.1)
Calcium: 9 mg/dL (ref 8.4–10.4)
Chloride: 107 mEq/L (ref 98–109)
EGFR: 58 mL/min/{1.73_m2} — ABNORMAL LOW (ref >90)
GLUCOSE: 74 mg/dL (ref 70–140)
Potassium: 3.7 mEq/L (ref 3.5–5.1)
SODIUM: 141 meq/L (ref 136–145)
TOTAL PROTEIN: 6.9 g/dL (ref 6.4–8.3)

## 2017-08-07 MED ORDER — SODIUM CHLORIDE 0.9% FLUSH
10.0000 mL | Freq: Once | INTRAVENOUS | Status: AC
  Administered 2017-08-07: 10 mL
  Filled 2017-08-07: qty 10

## 2017-08-07 MED ORDER — HEPARIN SOD (PORK) LOCK FLUSH 100 UNIT/ML IV SOLN
500.0000 [IU] | Freq: Once | INTRAVENOUS | Status: AC
  Administered 2017-08-07: 500 [IU]
  Filled 2017-08-07: qty 5

## 2017-08-07 NOTE — Telephone Encounter (Signed)
Scheduled appt per 10/4 sch msg. It was okay per charge nurse.

## 2017-08-07 NOTE — Progress Notes (Signed)
No blood today per MD Alvy Bimler

## 2017-08-08 LAB — CULTURE, BLOOD (ROUTINE X 2)
Culture: NO GROWTH
Culture: NO GROWTH
SPECIAL REQUESTS: ADEQUATE
SPECIAL REQUESTS: ADEQUATE

## 2017-08-08 LAB — CULTURE, BLOOD (SINGLE)
Culture: NO GROWTH
Special Requests: ADEQUATE

## 2017-08-13 ENCOUNTER — Ambulatory Visit (HOSPITAL_BASED_OUTPATIENT_CLINIC_OR_DEPARTMENT_OTHER): Payer: BC Managed Care – PPO | Admitting: Hematology and Oncology

## 2017-08-13 ENCOUNTER — Telehealth: Payer: Self-pay

## 2017-08-13 ENCOUNTER — Other Ambulatory Visit (HOSPITAL_BASED_OUTPATIENT_CLINIC_OR_DEPARTMENT_OTHER): Payer: BC Managed Care – PPO

## 2017-08-13 ENCOUNTER — Ambulatory Visit (HOSPITAL_BASED_OUTPATIENT_CLINIC_OR_DEPARTMENT_OTHER): Payer: BC Managed Care – PPO

## 2017-08-13 ENCOUNTER — Ambulatory Visit: Payer: BC Managed Care – PPO

## 2017-08-13 ENCOUNTER — Encounter: Payer: Self-pay | Admitting: Hematology and Oncology

## 2017-08-13 DIAGNOSIS — D61818 Other pancytopenia: Secondary | ICD-10-CM | POA: Diagnosis not present

## 2017-08-13 DIAGNOSIS — D6481 Anemia due to antineoplastic chemotherapy: Secondary | ICD-10-CM

## 2017-08-13 DIAGNOSIS — T451X5A Adverse effect of antineoplastic and immunosuppressive drugs, initial encounter: Secondary | ICD-10-CM

## 2017-08-13 DIAGNOSIS — C481 Malignant neoplasm of specified parts of peritoneum: Secondary | ICD-10-CM

## 2017-08-13 DIAGNOSIS — C569 Malignant neoplasm of unspecified ovary: Secondary | ICD-10-CM

## 2017-08-13 DIAGNOSIS — C801 Malignant (primary) neoplasm, unspecified: Secondary | ICD-10-CM | POA: Diagnosis not present

## 2017-08-13 DIAGNOSIS — C786 Secondary malignant neoplasm of retroperitoneum and peritoneum: Secondary | ICD-10-CM

## 2017-08-13 DIAGNOSIS — Z5111 Encounter for antineoplastic chemotherapy: Secondary | ICD-10-CM | POA: Diagnosis not present

## 2017-08-13 DIAGNOSIS — E538 Deficiency of other specified B group vitamins: Secondary | ICD-10-CM

## 2017-08-13 DIAGNOSIS — Z23 Encounter for immunization: Secondary | ICD-10-CM

## 2017-08-13 LAB — CBC WITH DIFFERENTIAL/PLATELET
BASO%: 0.6 % (ref 0.0–2.0)
Basophils Absolute: 0 10*3/uL (ref 0.0–0.1)
EOS ABS: 0.2 10*3/uL (ref 0.0–0.5)
EOS%: 4.2 % (ref 0.0–7.0)
HCT: 30.2 % — ABNORMAL LOW (ref 34.8–46.6)
HEMOGLOBIN: 10 g/dL — AB (ref 11.6–15.9)
LYMPH#: 0.3 10*3/uL — AB (ref 0.9–3.3)
LYMPH%: 8.8 % — ABNORMAL LOW (ref 14.0–49.7)
MCH: 30.8 pg (ref 25.1–34.0)
MCHC: 33 g/dL (ref 31.5–36.0)
MCV: 93.4 fL (ref 79.5–101.0)
MONO#: 0.4 10*3/uL (ref 0.1–0.9)
MONO%: 11.6 % (ref 0.0–14.0)
NEUT#: 2.9 10*3/uL (ref 1.5–6.5)
NEUT%: 74.8 % (ref 38.4–76.8)
Platelets: 126 10*3/uL — ABNORMAL LOW (ref 145–400)
RBC: 3.23 10*6/uL — ABNORMAL LOW (ref 3.70–5.45)
RDW: 16.9 % — AB (ref 11.2–14.5)
WBC: 3.8 10*3/uL — AB (ref 3.9–10.3)

## 2017-08-13 LAB — COMPREHENSIVE METABOLIC PANEL
ALBUMIN: 3.6 g/dL (ref 3.5–5.0)
ALK PHOS: 101 U/L (ref 40–150)
ALT: 40 U/L (ref 0–55)
AST: 25 U/L (ref 5–34)
Anion Gap: 13 mEq/L — ABNORMAL HIGH (ref 3–11)
BUN: 15.1 mg/dL (ref 7.0–26.0)
CALCIUM: 9 mg/dL (ref 8.4–10.4)
CO2: 25 mEq/L (ref 22–29)
Chloride: 102 mEq/L (ref 98–109)
Creatinine: 1 mg/dL (ref 0.6–1.1)
Glucose: 98 mg/dl (ref 70–140)
POTASSIUM: 4.2 meq/L (ref 3.5–5.1)
Sodium: 141 mEq/L (ref 136–145)
Total Bilirubin: 0.26 mg/dL (ref 0.20–1.20)
Total Protein: 7.2 g/dL (ref 6.4–8.3)

## 2017-08-13 MED ORDER — PROCHLORPERAZINE MALEATE 10 MG PO TABS
ORAL_TABLET | ORAL | Status: AC
  Filled 2017-08-13: qty 1

## 2017-08-13 MED ORDER — HEPARIN SOD (PORK) LOCK FLUSH 100 UNIT/ML IV SOLN
500.0000 [IU] | Freq: Once | INTRAVENOUS | Status: AC | PRN
  Administered 2017-08-13: 500 [IU]
  Filled 2017-08-13: qty 5

## 2017-08-13 MED ORDER — SODIUM CHLORIDE 0.9% FLUSH
10.0000 mL | Freq: Once | INTRAVENOUS | Status: AC
  Administered 2017-08-13: 10 mL
  Filled 2017-08-13: qty 10

## 2017-08-13 MED ORDER — SODIUM CHLORIDE 0.9 % IV SOLN
600.0000 mg/m2 | Freq: Once | INTRAVENOUS | Status: AC
  Administered 2017-08-13: 1254 mg via INTRAVENOUS
  Filled 2017-08-13: qty 32.98

## 2017-08-13 MED ORDER — SODIUM CHLORIDE 0.9% FLUSH
10.0000 mL | INTRAVENOUS | Status: DC | PRN
  Administered 2017-08-13: 10 mL
  Filled 2017-08-13: qty 10

## 2017-08-13 MED ORDER — PROCHLORPERAZINE MALEATE 10 MG PO TABS
10.0000 mg | ORAL_TABLET | Freq: Once | ORAL | Status: AC
  Administered 2017-08-13: 10 mg via ORAL

## 2017-08-13 MED ORDER — SODIUM CHLORIDE 0.9 % IV SOLN
Freq: Once | INTRAVENOUS | Status: AC
  Administered 2017-08-13: 13:00:00 via INTRAVENOUS

## 2017-08-13 MED ORDER — INFLUENZA VAC SPLIT QUAD 0.5 ML IM SUSY
0.5000 mL | PREFILLED_SYRINGE | Freq: Once | INTRAMUSCULAR | Status: AC
  Administered 2017-08-13: 0.5 mL via INTRAMUSCULAR
  Filled 2017-08-13: qty 0.5

## 2017-08-13 MED ORDER — HYDROCODONE-ACETAMINOPHEN 10-325 MG PO TABS: 1.0000 | ORAL_TABLET | ORAL | 0 refills | Status: DC | PRN

## 2017-08-13 NOTE — Patient Instructions (Signed)
[facility] °Discharge Instructions for Patients Receiving Chemotherapy ° °Today you received the following chemotherapy agents Gemzar ° °To help prevent nausea and vomiting after your treatment, we encourage you to take your nausea medication as directed. °  °If you develop nausea and vomiting that is not controlled by your nausea medication, call the clinic.  ° °BELOW ARE SYMPTOMS THAT SHOULD BE REPORTED IMMEDIATELY: °· *FEVER GREATER THAN 100.5 F °· *CHILLS WITH OR WITHOUT FEVER °· NAUSEA AND VOMITING THAT IS NOT CONTROLLED WITH YOUR NAUSEA MEDICATION °· *UNUSUAL SHORTNESS OF BREATH °· *UNUSUAL BRUISING OR BLEEDING °· TENDERNESS IN MOUTH AND THROAT WITH OR WITHOUT PRESENCE OF ULCERS °· *URINARY PROBLEMS °· *BOWEL PROBLEMS °· UNUSUAL RASH °Items with * indicate a potential emergency and should be followed up as soon as possible. ° °Feel free to call the clinic should you have any questions or concerns. The clinic phone number is [phone]. ° °Please show the CHEMO ALERT CARD at check-in to the Emergency Department and triage nurse. ° ° °

## 2017-08-13 NOTE — Telephone Encounter (Signed)
Printed avs and calender for upcoming appointment. Per 10/11 los 

## 2017-08-14 DIAGNOSIS — D61818 Other pancytopenia: Secondary | ICD-10-CM | POA: Insufficient documentation

## 2017-08-14 NOTE — Assessment & Plan Note (Signed)
She is not symptomatic She does not need blood transfusion or platelet transfusion We will proceed with reduced dose of treatment as above

## 2017-08-14 NOTE — Assessment & Plan Note (Signed)
She tolerated cycle 1 of gemcitabine very poorly, complicated by neutropenic fever and UTI She also require blood transfusion She has recovered well from recent admission I plan to reduce a dose of chemotherapy in the future I will omit day 8 and proceed with day 15 After that, she would get a week break In the future, I plan to move her treatment to be given on Mondays I plan minimum 3 cycles of chemotherapy before we proceed with repeat imaging study

## 2017-08-14 NOTE — Progress Notes (Signed)
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 This may potentially be ovarian or a GI in etiology.      04/30/2016 Tumor Marker    CA 125- 7149.0 (H)      05/01/2016 Procedure    US paracentesis- Successful ultrasound-guided paracentesis yielding 1.8 liters of peritoneal fluid.      05/01/2016 Imaging    US pelvis- Both transabdominal and transvaginal sonography are significantly limited by large patient habitus and ascites. Neither uterus or ovaries were visualized on this exam.      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      05/08/2016 Imaging    CT chest- No evidence of metastatic disease in the chest. Peritoneal/omental disease with abdominal ascites in the upper abdomen, incompletely visualized.       05/13/2016 Procedure    Placement of single lumen port a cath via right internal jugular vein. The catheter tip lies at the cavoatrial junction. A power injectable port a cath was placed and is ready for immediate use.      05/15/2016 Procedure    US Paracentesis- 3400 ml yellow colored ascites removed      05/15/2016 - 09/18/2016 Chemotherapy    Carboplatin/Paclitaxel every 21 days x 7 cycles      07/01/2016 Miscellaneous    Genetic Counseling by [name]-  Genetic testing was normal, and did not reveal a deleterious mutation in these genes.       07/08/2016 Imaging    CT CAP- 1. Small volume ascites, significantly  decreased. 2. Stable diffuse omental soft tissue caking and diffuse peritoneal thickening along the bilateral paracolic gutters and bilateral pelvic peritoneal reflections, consistent with peritoneal carcinomatosis. 3. Stable asymmetrically enlarged right ovary, which may represent the primary site of ovarian malignancy. 4. No evidence of metastatic disease in the chest. No new sites of metastatic disease in the abdomen or pelvis.      07/09/2016 Miscellaneous    Gyn Onc re-evaluation- modest response to therapy, 3 more cycles of chemotherapy recommended.        09/11/2016 Imaging    CT C/A/P No significant change omental soft tissue caking, consistent with metastatic disease. Mild ascites is decreased since previous study.  Increased calcification along peritoneal surface in pelvic cul-de-sac, consistent with treated peritoneal metastatic disease.  Stable 4.5cm homogeneous right pelvic mass, which favors a uterine fibroid although right ovarian neoplasm cannot definitely be excluded.  No new or progressive metastatic disease identified. No evidence of metastatic disease within the thorax.       10/14/2016 Procedure    Robotic-assisted laparoscopic total hysterectomy with bilateral salpingoophorectomy, ex lap omentectomy, radical tumor debulking by Dr. Denman George      10/17/2016 Pathology Results    Diagnosis 1. 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 - CERVIX AND ENDOMETRIUM FREE OF TUMOR. - SEE ONCOLOGY TABLE AND COMMENT. 2. Soft tissue, biopsy, umbilical nodule - HIGH GRADE SEROUS CARCINOMA. 3.  Omentum, resection for tumor - HIGH GRADE SEROUS CARCINOMA, 33 CM.      11/06/2016 - 12/25/2016 Chemotherapy    Carboplatin/Paclitaxel x 3 cycles       01/12/2017 Imaging    CT CAP- 1. Interval hysterectomy, bilateral salpingo-oophorectomy and omentectomy without evidence of  tumor recurrence. 2. No evidence of metastatic disease. 3. 5 mm nonobstructing lower pole left renal calculus.      01/13/2017 Remission    No evidence of residual disease on CT imaging.      05/04/2017 Imaging    CT CAP- New small amount of ascites within the abdomen and pelvis since 01/12/2017 which could indicate disease progression but no new identifiable tumor and no significant change in omental and mild peritoneal thickening.  No evidence of metastatic disease within the chest.  Coronary artery disease.  Aortic Atherosclerosis (ICD10-I70.0).      05/04/2017 Tumor Marker    Patient's tumor was tested for the following markers: CA 125 Results of the tumor marker test revealed 7106      05/18/2017 Imaging    ECHO; EF 60% -  65      05/25/2017 - 07/07/2017 Chemotherapy    She received Doxil and Avastin. Treatment is stopped due to disease progression      06/01/2017 Procedure    Successful ultrasound-guided therapeutic paracentesis yielding 2.8 liters of peritoneal fluid      06/22/2017 Tumor Marker    Patient's tumor was tested for the following markers: CA 125 Results of the tumor marker test revealed 13440      07/20/2017 Imaging    Mild increase in peritoneal carcinoma within abdomen pelvis since previous study. No significant change and minimal ascites.  No evidence of metastatic disease within the thorax. New mild airspace opacity in left lower lobe, consistent with inflammatory or infectious etiology.      07/30/2017 Tumor Marker    Patient's tumor was tested for the following markers: CA 125 Results of the tumor marker test revealed 12099      07/30/2017 -  Chemotherapy    The patient had gemzar      08/03/2017 - 08/05/2017 Hospital Admission    She was admitted to the hospital for management of UTI and neutropenic fever      08/13/2017 Adverse Reaction    Dose of chemotherapy is reduced due to neutropenic sepsis       INTERVAL HISTORY: Please see  below for problem oriented charting. She returns for further follow-up I review her records The patient was recently admitted to the hospital after cycle 1 due to neutropenic sepsis secondary to UTI She is not symptomatic now No recent fever, chills or nausea The patient denies any recent signs or symptoms of bleeding such as spontaneous epistaxis, hematuria or hematochezia.  REVIEW OF SYSTEMS:   Constitutional: Denies fevers, chills or abnormal weight loss Eyes: Denies blurriness of vision Ears, nose, mouth, throat, and face: Denies mucositis or sore throat Respiratory: Denies cough, dyspnea or wheezes Cardiovascular: Denies palpitation, chest discomfort or lower extremity swelling Gastrointestinal:  Denies nausea, heartburn or change in bowel habits Skin: Denies abnormal skin rashes Lymphatics: Denies new lymphadenopathy or easy bruising Neurological:Denies numbness, tingling or new weaknesses Behavioral/Psych: Mood is stable, no new changes  All other systems were reviewed with the patient and are negative.  I have reviewed the past medical history, past surgical history, social history and family history with the patient and they are unchanged from previous note.  ALLERGIES:  is allergic  to avandia [rosiglitazone]; micronase [glyburide]; and actos [pioglitazone].  MEDICATIONS:  Current Outpatient Prescriptions  Medication Sig Dispense Refill  . atorvastatin (LIPITOR) 40 MG tablet Take 1 tablet (40 mg total) by mouth daily. 90 tablet 1  . benazepril (LOTENSIN) 10 MG tablet Take 1 tablet (10 mg total) by mouth daily. 90 tablet 1  . furosemide (LASIX) 20 MG tablet Take 1 tablet (20 mg total) by mouth 2 (two) times daily. 90 tablet 1  . HYDROcodone-acetaminophen (NORCO) 10-325 MG tablet Take 1 tablet by mouth every 4 (four) hours as needed. 90 tablet 0  . lidocaine-prilocaine (EMLA) cream Apply a quarter size amount to port site 1 hour prior to chemo. Do not rub in. Cover with plastic  wrap. 30 g 3  . loperamide (IMODIUM A-D) 2 MG tablet Take 1 tablet (2 mg total) by mouth 4 (four) times daily as needed for diarrhea or loose stools. 30 tablet 0  . metFORMIN (GLUCOPHAGE) 1000 MG tablet TAKE 1 TABLET BY MOUTH TWICE DAILY WITH A MEAL 180 tablet 1  . Multiple Vitamin (MULTIVITAMIN WITH MINERALS) TABS Take 1 tablet by mouth daily.    . multivitamin-lutein (OCUVITE-LUTEIN) CAPS capsule Take 1 capsule by mouth daily.    Marland Kitchen NOVOLOG 100 UNIT/ML injection USE IN INSULIN PUMP AS DIRECTED. MAX DAILY DOSE OF 110 UNITS PER DAY 120 mL 0  . omeprazole (PRILOSEC) 40 MG capsule Take 40 mg by mouth daily.     . ondansetron (ZOFRAN) 8 MG tablet Take by mouth every 8 (eight) hours as needed for nausea or vomiting.    . ONE TOUCH ULTRA TEST test strip USE TO CHECK BLOOD SUGAR UP TO 5 TIMES A DAY 450 each 2  . polyethylene glycol (MIRALAX / GLYCOLAX) packet Take 17 g by mouth daily as needed for mild constipation.     . senna (SENOKOT) 8.6 MG TABS tablet Take 1 tablet (8.6 mg total) by mouth at bedtime as needed for moderate constipation. 120 each 0  . vitamin B-12 (CYANOCOBALAMIN) 1000 MCG tablet Take 1,000 mcg by mouth daily.    . vitamin E (VITAMIN E) 400 UNIT capsule Take 400 Units by mouth daily.     No current facility-administered medications for this visit.     PHYSICAL EXAMINATION: ECOG PERFORMANCE STATUS: 1 - Symptomatic but completely ambulatory  Vitals:   08/13/17 1145  BP: (!) 140/50  Pulse: 74  Resp: 20  Temp: 97.6 F (36.4 C)  SpO2: 99%   Filed Weights   08/13/17 1145  Weight: 209 lb (94.8 kg)    GENERAL:alert, no distress and comfortable.  She is morbidly obese SKIN: skin color, texture, turgor are normal, no rashes or significant lesions EYES: normal, Conjunctiva are pink and non-injected, sclera clear OROPHARYNX:no exudate, no erythema and lips, buccal mucosa, and tongue normal  NECK: supple, thyroid normal size, non-tender, without nodularity LYMPH:  no palpable  lymphadenopathy in the cervical, axillary or inguinal LUNGS: clear to auscultation and percussion with normal breathing effort HEART: regular rate & rhythm and no murmurs and no lower extremity edema ABDOMEN:abdomen soft, non-tender and normal bowel sounds Musculoskeletal:no cyanosis of digits and no clubbing  NEURO: alert & oriented x 3 with fluent speech, no focal motor/sensory deficits  LABORATORY DATA:  I have reviewed the data as listed    Component Value Date/Time   NA 141 08/13/2017 1122   K 4.2 08/13/2017 1122   CL 114 (H) 08/05/2017 0448   CO2 25 08/13/2017 1122   GLUCOSE 98  08/13/2017 1122   BUN 15.1 08/13/2017 1122   CREATININE 1.0 08/13/2017 1122   CALCIUM 9.0 08/13/2017 1122   PROT 7.2 08/13/2017 1122   ALBUMIN 3.6 08/13/2017 1122   AST 25 08/13/2017 1122   ALT 40 08/13/2017 1122   ALKPHOS 101 08/13/2017 1122   BILITOT 0.26 08/13/2017 1122   GFRNONAA 60 (L) 08/05/2017 0448   GFRAA >60 08/05/2017 0448    No results found for: SPEP, UPEP  Lab Results  Component Value Date   WBC 3.8 (L) 08/13/2017   NEUTROABS 2.9 08/13/2017   HGB 10.0 (L) 08/13/2017   HCT 30.2 (L) 08/13/2017   MCV 93.4 08/13/2017   PLT 126 (L) 08/13/2017      Chemistry      Component Value Date/Time   NA 141 08/13/2017 1122   K 4.2 08/13/2017 1122   CL 114 (H) 08/05/2017 0448   CO2 25 08/13/2017 1122   BUN 15.1 08/13/2017 1122   CREATININE 1.0 08/13/2017 1122      Component Value Date/Time   CALCIUM 9.0 08/13/2017 1122   ALKPHOS 101 08/13/2017 1122   AST 25 08/13/2017 1122   ALT 40 08/13/2017 1122   BILITOT 0.26 08/13/2017 1122       RADIOGRAPHIC STUDIES: I have personally reviewed the radiological images as listed and agreed with the findings in the report. Dg Chest 2 View  Result Date: 08/04/2017 CLINICAL DATA:  Shortness of breath and pneumonia. EXAM: CHEST  2 VIEW COMPARISON:  11/19/2016 and CT of the chest on 07/20/2017 FINDINGS: Port-A-Cath positioning stable with the  catheter tip in the lower SVC. There is chronic elevation of the right hemidiaphragm and some chronic scarring at the right lung base. There is no evidence of pulmonary edema, consolidation, pneumothorax, nodule or pleural fluid. The heart size is normal. No overt pulmonary edema. No pleural effusions identified. Bony structures are unremarkable. IMPRESSION: Stable scarring at the right lung base. No acute airspace disease identified. Electronically Signed   By: Aletta Edouard M.D.   On: 08/04/2017 16:39   Ct Chest W Contrast  Result Date: 07/20/2017 CLINICAL DATA:  Followup ovarian and peritoneal carcinoma. Undergoing chemotherapy. Restaging. EXAM: CT CHEST, ABDOMEN, AND PELVIS WITH CONTRAST TECHNIQUE: Multidetector CT imaging of the chest, abdomen and pelvis was performed following the standard protocol during bolus administration of intravenous contrast. CONTRAST:  137mL ISOVUE-300 IOPAMIDOL (ISOVUE-300) INJECTION 61% COMPARISON:  05/04/2017 FINDINGS: CT CHEST FINDINGS Cardiovascular: No acute findings. Mediastinum/Lymph Nodes: Stable tiny sub-cm thyroid nodules. No masses or pathologically enlarged lymph nodes identified. Lungs/Pleura: Mild ill-defined area of airspace opacity seen in the superior left lower lobe which is new since previous study, and consistent with inflammatory or infectious etiology. No suspicious pulmonary nodules identified. No evidence of pleural effusion. Musculoskeletal:  No suspicious bone lesions identified. CT ABDOMEN AND PELVIS FINDINGS Hepatobiliary: No masses identified. Gallbladder is unremarkable. Pancreas:  No mass or inflammatory changes. Spleen:  Within normal limits in size and appearance. Adrenals/Urinary tract: No masses or hydronephrosis. 2-3 mm nonobstructing calculus again seen in lower pole of left kidney. Stomach/Bowel:  No evidence of bowel obstruction. Vascular/Lymphatic: No pathologically enlarged lymph nodes identified. No abdominal aortic aneurysm. Aortic  atherosclerosis. Reproductive: Prior hysterectomy noted. Mild increase in peritoneal soft tissue thickening seen in central pelvis. Mild increase in soft tissue densities seen within the omental fat mainly in the left abdomen. New peritoneal soft tissue density seen in the gastrohepatic ligament, measuring 3.9 x 3.0 cm on image 54/2. Minimal ascites shows no  significant change. Other:  None. Musculoskeletal:  No suspicious bone lesions identified. IMPRESSION: Mild increase in peritoneal carcinoma within abdomen pelvis since previous study. No significant change and minimal ascites. No evidence of metastatic disease within the thorax. New mild airspace opacity in left lower lobe, consistent with inflammatory or infectious etiology. Electronically Signed   By: Earle Gell M.D.   On: 07/20/2017 11:46   Ct Abdomen Pelvis W Contrast  Result Date: 07/20/2017 CLINICAL DATA:  Followup ovarian and peritoneal carcinoma. Undergoing chemotherapy. Restaging. EXAM: CT CHEST, ABDOMEN, AND PELVIS WITH CONTRAST TECHNIQUE: Multidetector CT imaging of the chest, abdomen and pelvis was performed following the standard protocol during bolus administration of intravenous contrast. CONTRAST:  165mL ISOVUE-300 IOPAMIDOL (ISOVUE-300) INJECTION 61% COMPARISON:  05/04/2017 FINDINGS: CT CHEST FINDINGS Cardiovascular: No acute findings. Mediastinum/Lymph Nodes: Stable tiny sub-cm thyroid nodules. No masses or pathologically enlarged lymph nodes identified. Lungs/Pleura: Mild ill-defined area of airspace opacity seen in the superior left lower lobe which is new since previous study, and consistent with inflammatory or infectious etiology. No suspicious pulmonary nodules identified. No evidence of pleural effusion. Musculoskeletal:  No suspicious bone lesions identified. CT ABDOMEN AND PELVIS FINDINGS Hepatobiliary: No masses identified. Gallbladder is unremarkable. Pancreas:  No mass or inflammatory changes. Spleen:  Within normal limits in  size and appearance. Adrenals/Urinary tract: No masses or hydronephrosis. 2-3 mm nonobstructing calculus again seen in lower pole of left kidney. Stomach/Bowel:  No evidence of bowel obstruction. Vascular/Lymphatic: No pathologically enlarged lymph nodes identified. No abdominal aortic aneurysm. Aortic atherosclerosis. Reproductive: Prior hysterectomy noted. Mild increase in peritoneal soft tissue thickening seen in central pelvis. Mild increase in soft tissue densities seen within the omental fat mainly in the left abdomen. New peritoneal soft tissue density seen in the gastrohepatic ligament, measuring 3.9 x 3.0 cm on image 54/2. Minimal ascites shows no significant change. Other:  None. Musculoskeletal:  No suspicious bone lesions identified. IMPRESSION: Mild increase in peritoneal carcinoma within abdomen pelvis since previous study. No significant change and minimal ascites. No evidence of metastatic disease within the thorax. New mild airspace opacity in left lower lobe, consistent with inflammatory or infectious etiology. Electronically Signed   By: Earle Gell M.D.   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[place]) She tolerated cycle 1 of gemcitabine very poorly, complicated by neutropenic fever and UTI She also require blood transfusion She has recovered well from recent admission I plan to reduce a dose of chemotherapy in the future I will omit day 8 and proceed with day 15 After that, she would get a week break In the future, I plan to move her treatment to be given on Mondays I plan minimum 3 cycles of chemotherapy before we proceed with repeat imaging study  Pancytopenia, acquired ([place]) She is not symptomatic She does not need blood transfusion or platelet transfusion We will proceed with reduced dose of treatment as above   No orders of the defined types were placed in this encounter.  All questions were answered. The patient knows to  call the clinic with any problems, questions or concerns. No barriers to learning was detected. I spent 15 minutes counseling the patient face to face. The total time spent in the appointment was 25 minutes and more than 50% was on counseling and review of test results     Heath Lark, MD 08/14/2017 8:15 AM

## 2017-08-24 ENCOUNTER — Telehealth: Payer: Self-pay

## 2017-08-24 ENCOUNTER — Ambulatory Visit (HOSPITAL_BASED_OUTPATIENT_CLINIC_OR_DEPARTMENT_OTHER): Payer: BC Managed Care – PPO | Admitting: Hematology and Oncology

## 2017-08-24 ENCOUNTER — Other Ambulatory Visit (HOSPITAL_BASED_OUTPATIENT_CLINIC_OR_DEPARTMENT_OTHER): Payer: BC Managed Care – PPO

## 2017-08-24 ENCOUNTER — Ambulatory Visit (HOSPITAL_BASED_OUTPATIENT_CLINIC_OR_DEPARTMENT_OTHER): Payer: BC Managed Care – PPO

## 2017-08-24 DIAGNOSIS — C786 Secondary malignant neoplasm of retroperitoneum and peritoneum: Secondary | ICD-10-CM | POA: Diagnosis not present

## 2017-08-24 DIAGNOSIS — T451X5A Adverse effect of antineoplastic and immunosuppressive drugs, initial encounter: Secondary | ICD-10-CM

## 2017-08-24 DIAGNOSIS — R748 Abnormal levels of other serum enzymes: Secondary | ICD-10-CM

## 2017-08-24 DIAGNOSIS — C481 Malignant neoplasm of specified parts of peritoneum: Secondary | ICD-10-CM

## 2017-08-24 DIAGNOSIS — Z5111 Encounter for antineoplastic chemotherapy: Secondary | ICD-10-CM | POA: Diagnosis not present

## 2017-08-24 DIAGNOSIS — D6481 Anemia due to antineoplastic chemotherapy: Secondary | ICD-10-CM

## 2017-08-24 DIAGNOSIS — D61818 Other pancytopenia: Secondary | ICD-10-CM

## 2017-08-24 LAB — CBC WITH DIFFERENTIAL/PLATELET
BASO%: 0.6 % (ref 0.0–2.0)
Basophils Absolute: 0 10*3/uL (ref 0.0–0.1)
EOS ABS: 0.1 10*3/uL (ref 0.0–0.5)
EOS%: 4.3 % (ref 0.0–7.0)
HCT: 29.6 % — ABNORMAL LOW (ref 34.8–46.6)
HEMOGLOBIN: 9.6 g/dL — AB (ref 11.6–15.9)
LYMPH%: 10.4 % — ABNORMAL LOW (ref 14.0–49.7)
MCH: 30.7 pg (ref 25.1–34.0)
MCHC: 32.5 g/dL (ref 31.5–36.0)
MCV: 94.5 fL (ref 79.5–101.0)
MONO#: 0.5 10*3/uL (ref 0.1–0.9)
MONO%: 20.2 % — AB (ref 0.0–14.0)
NEUT%: 64.5 % (ref 38.4–76.8)
NEUTROS ABS: 1.6 10*3/uL (ref 1.5–6.5)
Platelets: 115 10*3/uL — ABNORMAL LOW (ref 145–400)
RBC: 3.13 10*6/uL — AB (ref 3.70–5.45)
RDW: 17.4 % — AB (ref 11.2–14.5)
WBC: 2.5 10*3/uL — AB (ref 3.9–10.3)
lymph#: 0.3 10*3/uL — ABNORMAL LOW (ref 0.9–3.3)

## 2017-08-24 LAB — COMPREHENSIVE METABOLIC PANEL
ALBUMIN: 3.7 g/dL (ref 3.5–5.0)
ALK PHOS: 85 U/L (ref 40–150)
ALT: 48 U/L (ref 0–55)
ANION GAP: 14 meq/L — AB (ref 3–11)
AST: 49 U/L — ABNORMAL HIGH (ref 5–34)
BILIRUBIN TOTAL: 0.4 mg/dL (ref 0.20–1.20)
BUN: 22.6 mg/dL (ref 7.0–26.0)
CALCIUM: 9.7 mg/dL (ref 8.4–10.4)
CHLORIDE: 103 meq/L (ref 98–109)
CO2: 24 mEq/L (ref 22–29)
CREATININE: 1.1 mg/dL (ref 0.6–1.1)
EGFR: 53 mL/min/{1.73_m2} — ABNORMAL LOW (ref >60)
Glucose: 143 mg/dl — ABNORMAL HIGH (ref 70–140)
Potassium: 4.6 mEq/L (ref 3.5–5.1)
Sodium: 141 mEq/L (ref 136–145)
Total Protein: 7.4 g/dL (ref 6.4–8.3)

## 2017-08-24 MED ORDER — HEPARIN SOD (PORK) LOCK FLUSH 100 UNIT/ML IV SOLN
500.0000 [IU] | Freq: Once | INTRAVENOUS | Status: AC | PRN
  Administered 2017-08-24: 500 [IU]
  Filled 2017-08-24: qty 5

## 2017-08-24 MED ORDER — PROCHLORPERAZINE MALEATE 10 MG PO TABS
ORAL_TABLET | ORAL | Status: AC
  Filled 2017-08-24: qty 1

## 2017-08-24 MED ORDER — SODIUM CHLORIDE 0.9 % IV SOLN
Freq: Once | INTRAVENOUS | Status: AC
  Administered 2017-08-24: 12:00:00 via INTRAVENOUS

## 2017-08-24 MED ORDER — SODIUM CHLORIDE 0.9% FLUSH
10.0000 mL | INTRAVENOUS | Status: DC | PRN
  Administered 2017-08-24: 10 mL
  Filled 2017-08-24: qty 10

## 2017-08-24 MED ORDER — SODIUM CHLORIDE 0.9 % IV SOLN
600.0000 mg/m2 | Freq: Once | INTRAVENOUS | Status: AC
  Administered 2017-08-24: 1254 mg via INTRAVENOUS
  Filled 2017-08-24: qty 32.98

## 2017-08-24 MED ORDER — PROCHLORPERAZINE MALEATE 10 MG PO TABS
10.0000 mg | ORAL_TABLET | Freq: Once | ORAL | Status: AC
  Administered 2017-08-24: 10 mg via ORAL

## 2017-08-24 NOTE — Telephone Encounter (Signed)
Completed patient scheduling for November upcoming appointments. Per 10/22 los

## 2017-08-24 NOTE — Patient Instructions (Signed)
[facility] °Discharge Instructions for Patients Receiving Chemotherapy ° °Today you received the following chemotherapy agents Gemzar ° °To help prevent nausea and vomiting after your treatment, we encourage you to take your nausea medication as directed. °  °If you develop nausea and vomiting that is not controlled by your nausea medication, call the clinic.  ° °BELOW ARE SYMPTOMS THAT SHOULD BE REPORTED IMMEDIATELY: °· *FEVER GREATER THAN 100.5 F °· *CHILLS WITH OR WITHOUT FEVER °· NAUSEA AND VOMITING THAT IS NOT CONTROLLED WITH YOUR NAUSEA MEDICATION °· *UNUSUAL SHORTNESS OF BREATH °· *UNUSUAL BRUISING OR BLEEDING °· TENDERNESS IN MOUTH AND THROAT WITH OR WITHOUT PRESENCE OF ULCERS °· *URINARY PROBLEMS °· *BOWEL PROBLEMS °· UNUSUAL RASH °Items with * indicate a potential emergency and should be followed up as soon as possible. ° °Feel free to call the clinic should you have any questions or concerns. The clinic phone number is [phone]. ° °Please show the CHEMO ALERT CARD at check-in to the Emergency Department and triage nurse. ° ° °

## 2017-08-25 ENCOUNTER — Other Ambulatory Visit: Payer: Self-pay | Admitting: Hematology and Oncology

## 2017-08-25 ENCOUNTER — Encounter: Payer: Self-pay | Admitting: Hematology and Oncology

## 2017-08-25 DIAGNOSIS — C569 Malignant neoplasm of unspecified ovary: Secondary | ICD-10-CM

## 2017-08-25 DIAGNOSIS — R748 Abnormal levels of other serum enzymes: Secondary | ICD-10-CM | POA: Insufficient documentation

## 2017-08-25 LAB — CA 125: Cancer Antigen (CA) 125: 7431 U/mL — ABNORMAL HIGH (ref 0.0–38.1)

## 2017-08-25 NOTE — Assessment & Plan Note (Signed)
The patient has pancytopenia but not symptomatic I warned her that we may not be able to complete day 15 of each cycle if her blood counts from too low despite dose adjustment

## 2017-08-25 NOTE — Progress Notes (Signed)
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 This may potentially be ovarian or a GI in etiology.      04/30/2016 Tumor Marker    CA 125- 7149.0 (H)      05/01/2016 Procedure    US paracentesis- Successful ultrasound-guided paracentesis yielding 1.8 liters of peritoneal fluid.      05/01/2016 Imaging    US pelvis- Both transabdominal and transvaginal sonography are significantly limited by large patient habitus and ascites. Neither uterus or ovaries were visualized on this exam.      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      05/08/2016 Imaging    CT chest- No evidence of metastatic disease in the chest. Peritoneal/omental disease with abdominal ascites in the upper abdomen, incompletely visualized.       05/13/2016 Procedure    Placement of single lumen port a cath via right internal jugular vein. The catheter tip lies at the cavoatrial junction. A power injectable port a cath was placed and is ready for immediate use.      05/15/2016 Procedure    US Paracentesis- 3400 ml yellow colored ascites removed      05/15/2016 - 09/18/2016 Chemotherapy    Carboplatin/Paclitaxel every 21 days x 7 cycles      07/01/2016 Miscellaneous    Genetic Counseling by [name]-  Genetic testing was normal, and did not reveal a deleterious mutation in these genes.       07/08/2016 Imaging    CT CAP- 1. Small volume ascites, significantly  decreased. 2. Stable diffuse omental soft tissue caking and diffuse peritoneal thickening along the bilateral paracolic gutters and bilateral pelvic peritoneal reflections, consistent with peritoneal carcinomatosis. 3. Stable asymmetrically enlarged right ovary, which may represent the primary site of ovarian malignancy. 4. No evidence of metastatic disease in the chest. No new sites of metastatic disease in the abdomen or pelvis.      07/09/2016 Miscellaneous    Gyn Onc re-evaluation- modest response to therapy, 3 more cycles of chemotherapy recommended.        09/11/2016 Imaging    CT C/A/P No significant change omental soft tissue caking, consistent with metastatic disease. Mild ascites is decreased since previous study.  Increased calcification along peritoneal surface in pelvic cul-de-sac, consistent with treated peritoneal metastatic disease.  Stable 4.5cm homogeneous right pelvic mass, which favors a uterine fibroid although right ovarian neoplasm cannot definitely be excluded.  No new or progressive metastatic disease identified. No evidence of metastatic disease within the thorax.       10/14/2016 Procedure    Robotic-assisted laparoscopic total hysterectomy with bilateral salpingoophorectomy, ex lap omentectomy, radical tumor debulking by Dr. Denman George      10/17/2016 Pathology Results    Diagnosis 1. 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 - CERVIX AND ENDOMETRIUM FREE OF TUMOR. - SEE ONCOLOGY TABLE AND COMMENT. 2. Soft tissue, biopsy, umbilical nodule - HIGH GRADE SEROUS CARCINOMA. 3.  Omentum, resection for tumor - HIGH GRADE SEROUS CARCINOMA, 33 CM.      11/06/2016 - 12/25/2016 Chemotherapy    Carboplatin/Paclitaxel x 3 cycles       01/12/2017 Imaging    CT CAP- 1. Interval hysterectomy, bilateral salpingo-oophorectomy and omentectomy without evidence of  tumor recurrence. 2. No evidence of metastatic disease. 3. 5 mm nonobstructing lower pole left renal calculus.      01/13/2017 Remission    No evidence of residual disease on CT imaging.      05/04/2017 Imaging    CT CAP- New small amount of ascites within the abdomen and pelvis since 01/12/2017 which could indicate disease progression but no new identifiable tumor and no significant change in omental and mild peritoneal thickening.  No evidence of metastatic disease within the chest.  Coronary artery disease.  Aortic Atherosclerosis (ICD10-I70.0).      05/04/2017 Tumor Marker    Patient's tumor was tested for the following markers: CA 125 Results of the tumor marker test revealed 6767      05/18/2017 Imaging    ECHO; EF 60% -  65      05/25/2017 - 07/07/2017 Chemotherapy    She received Doxil and Avastin. Treatment is stopped due to disease progression      06/01/2017 Procedure    Successful ultrasound-guided therapeutic paracentesis yielding 2.8 liters of peritoneal fluid      06/22/2017 Tumor Marker    Patient's tumor was tested for the following markers: CA 125 Results of the tumor marker test revealed 13440      07/20/2017 Imaging    Mild increase in peritoneal carcinoma within abdomen pelvis since previous study. No significant change and minimal ascites.  No evidence of metastatic disease within the thorax. New mild airspace opacity in left lower lobe, consistent with inflammatory or infectious etiology.      07/30/2017 Tumor Marker    Patient's tumor was tested for the following markers: CA 125 Results of the tumor marker test revealed 12099      07/30/2017 -  Chemotherapy    The patient had gemzar      08/03/2017 - 08/05/2017 Hospital Admission    She was admitted to the hospital for management of UTI and neutropenic fever      08/13/2017 Adverse Reaction    Dose of chemotherapy is reduced due to neutropenic sepsis      08/24/2017 Tumor Marker     Patient's tumor was tested for the following markers: CA 125 Results of the tumor marker test revealed [id number]       INTERVAL HISTORY: Please see below for problem oriented charting. She is seen for further follow-up She tolerated last cycle of treatment well No recent fever or chills She had frequent urination but denies dysuria, frequency or urgency No recent nausea or vomiting The patient denies any recent signs or symptoms of bleeding such as spontaneous epistaxis, hematuria or hematochezia.   REVIEW OF SYSTEMS:   Constitutional: Denies fevers, chills or abnormal weight loss Eyes: Denies blurriness of vision Ears, nose, mouth, throat, and face: Denies mucositis or sore throat Respiratory: Denies cough, dyspnea or wheezes Cardiovascular: Denies palpitation, chest discomfort or lower extremity swelling Gastrointestinal:  Denies nausea, heartburn or change in bowel habits Skin: Denies abnormal skin rashes Lymphatics: Denies new lymphadenopathy or easy bruising Neurological:Denies numbness, tingling or new weaknesses Behavioral/Psych: Mood is stable, no new changes  All other systems were reviewed with the patient and are negative.  I have reviewed the past  medical history, past surgical history, social history and family history with the patient and they are unchanged from previous note.  ALLERGIES:  is allergic to avandia [rosiglitazone]; micronase [glyburide]; and actos [pioglitazone].  MEDICATIONS:  Current Outpatient Prescriptions  Medication Sig Dispense Refill  . cyanocobalamin 1000 MCG tablet Take 1,000 mcg by mouth daily.    Marland Kitchen atorvastatin (LIPITOR) 40 MG tablet Take 1 tablet (40 mg total) by mouth daily. 90 tablet 1  . benazepril (LOTENSIN) 10 MG tablet Take 1 tablet (10 mg total) by mouth daily. 90 tablet 1  . furosemide (LASIX) 20 MG tablet Take 1 tablet (20 mg total) by mouth 2 (two) times daily. 90 tablet 1  . HYDROcodone-acetaminophen (NORCO) 10-325 MG tablet Take  1 tablet by mouth every 4 (four) hours as needed. 90 tablet 0  . lidocaine-prilocaine (EMLA) cream Apply a quarter size amount to port site 1 hour prior to chemo. Do not rub in. Cover with plastic wrap. 30 g 3  . loperamide (IMODIUM A-D) 2 MG tablet Take 1 tablet (2 mg total) by mouth 4 (four) times daily as needed for diarrhea or loose stools. 30 tablet 0  . metFORMIN (GLUCOPHAGE) 1000 MG tablet TAKE 1 TABLET BY MOUTH TWICE DAILY WITH A MEAL 180 tablet 1  . Multiple Vitamin (MULTIVITAMIN WITH MINERALS) TABS Take 1 tablet by mouth daily.    . multivitamin-lutein (OCUVITE-LUTEIN) CAPS capsule Take 1 capsule by mouth daily.    Marland Kitchen NOVOLOG 100 UNIT/ML injection USE IN INSULIN PUMP AS DIRECTED. MAX DAILY DOSE OF 110 UNITS PER DAY 120 mL 0  . omeprazole (PRILOSEC) 40 MG capsule Take 40 mg by mouth daily.     . ondansetron (ZOFRAN) 8 MG tablet Take by mouth every 8 (eight) hours as needed for nausea or vomiting.    . ONE TOUCH ULTRA TEST test strip USE TO CHECK BLOOD SUGAR UP TO 5 TIMES A DAY 450 each 2  . polyethylene glycol (MIRALAX / GLYCOLAX) packet Take 17 g by mouth daily as needed for mild constipation.     . senna (SENOKOT) 8.6 MG TABS tablet Take 1 tablet (8.6 mg total) by mouth at bedtime as needed for moderate constipation. 120 each 0  . vitamin B-12 (CYANOCOBALAMIN) 1000 MCG tablet Take 1,000 mcg by mouth daily.    . vitamin E (VITAMIN E) 400 UNIT capsule Take 400 Units by mouth daily.     No current facility-administered medications for this visit.     PHYSICAL EXAMINATION: ECOG PERFORMANCE STATUS: 1 - Symptomatic but completely ambulatory  Vitals:   08/24/17 1057  BP: (!) 146/70  Pulse: 97  Resp: 17  Temp: 97.9 F (36.6 C)  SpO2: 96%   Filed Weights   08/24/17 1057  Weight: 205 lb 9.6 oz (93.3 kg)    GENERAL:alert, no distress and comfortable SKIN: skin color, texture, turgor are normal, no rashes or significant lesions EYES: normal, Conjunctiva are pink and non-injected,  sclera clear OROPHARYNX:no exudate, no erythema and lips, buccal mucosa, and tongue normal  NECK: supple, thyroid normal size, non-tender, without nodularity LYMPH:  no palpable lymphadenopathy in the cervical, axillary or inguinal LUNGS: clear to auscultation and percussion with normal breathing effort HEART: regular rate & rhythm and no murmurs and no lower extremity edema ABDOMEN:abdomen soft, non-tender and normal bowel sounds Musculoskeletal:no cyanosis of digits and no clubbing  NEURO: alert & oriented x 3 with fluent speech, no focal motor/sensory deficits  LABORATORY DATA:  I have reviewed the data as  listed    Component Value Date/Time   NA 141 08/24/2017 1033   K 4.6 08/24/2017 1033   CL 114 (H) 08/05/2017 0448   CO2 24 08/24/2017 1033   GLUCOSE 143 (H) 08/24/2017 1033   BUN 22.6 08/24/2017 1033   CREATININE 1.1 08/24/2017 1033   CALCIUM 9.7 08/24/2017 1033   PROT 7.4 08/24/2017 1033   ALBUMIN 3.7 08/24/2017 1033   AST 49 (H) 08/24/2017 1033   ALT 48 08/24/2017 1033   ALKPHOS 85 08/24/2017 1033   BILITOT 0.40 08/24/2017 1033   GFRNONAA 60 (L) 08/05/2017 0448   GFRAA >60 08/05/2017 0448    No results found for: SPEP, UPEP  Lab Results  Component Value Date   WBC 2.5 (L) 08/24/2017   NEUTROABS 1.6 08/24/2017   HGB 9.6 (L) 08/24/2017   HCT 29.6 (L) 08/24/2017   MCV 94.5 08/24/2017   PLT 115 (L) 08/24/2017      Chemistry      Component Value Date/Time   NA 141 08/24/2017 1033   K 4.6 08/24/2017 1033   CL 114 (H) 08/05/2017 0448   CO2 24 08/24/2017 1033   BUN 22.6 08/24/2017 1033   CREATININE 1.1 08/24/2017 1033      Component Value Date/Time   CALCIUM 9.7 08/24/2017 1033   ALKPHOS 85 08/24/2017 1033   AST 49 (H) 08/24/2017 1033   ALT 48 08/24/2017 1033   BILITOT 0.40 08/24/2017 1033       RADIOGRAPHIC STUDIES: I have personally reviewed the radiological images as listed and agreed with the findings in the report. Dg Chest 2 View  Result Date:  08/04/2017 CLINICAL DATA:  Shortness of breath and pneumonia. EXAM: CHEST  2 VIEW COMPARISON:  11/19/2016 and CT of the chest on 07/20/2017 FINDINGS: Port-A-Cath positioning stable with the catheter tip in the lower SVC. There is chronic elevation of the right hemidiaphragm and some chronic scarring at the right lung base. There is no evidence of pulmonary edema, consolidation, pneumothorax, nodule or pleural fluid. The heart size is normal. No overt pulmonary edema. No pleural effusions identified. Bony structures are unremarkable. IMPRESSION: Stable scarring at the right lung base. No acute airspace disease identified. Electronically Signed   By: Aletta Edouard M.D.   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[place]) She tolerated cycle 1 of gemcitabine very poorly, complicated by neutropenic fever and UTI She also require blood transfusion I plan minimum 3 cycles of chemotherapy before we proceed with repeat imaging study We will continue with dose adjustment  Pancytopenia, acquired ([place]) The patient has pancytopenia but not symptomatic I warned her that we may not be able to complete day 15 of each cycle if her blood counts from too low despite dose adjustment  Elevated liver enzymes She has mildly elevated liver enzymes, likely due to treatment Observe only   No orders of the defined types were placed in this encounter.  All questions were answered. The patient knows to call the clinic with any problems, questions or concerns. No barriers to learning was detected. I spent 15 minutes counseling the patient face to face. The total time spent in the appointment was 20 minutes and more than 50% was on counseling and review of test results     Heath Lark, MD 08/25/2017 7:11 AM

## 2017-08-25 NOTE — Assessment & Plan Note (Signed)
She tolerated cycle 1 of gemcitabine very poorly, complicated by neutropenic fever and UTI She also require blood transfusion I plan minimum 3 cycles of chemotherapy before we proceed with repeat imaging study We will continue with dose adjustment

## 2017-08-25 NOTE — Assessment & Plan Note (Signed)
She has mildly elevated liver enzymes, likely due to treatment Observe only

## 2017-08-31 ENCOUNTER — Ambulatory Visit: Payer: BC Managed Care – PPO

## 2017-08-31 ENCOUNTER — Other Ambulatory Visit (HOSPITAL_BASED_OUTPATIENT_CLINIC_OR_DEPARTMENT_OTHER): Payer: BC Managed Care – PPO

## 2017-08-31 DIAGNOSIS — D6481 Anemia due to antineoplastic chemotherapy: Secondary | ICD-10-CM

## 2017-08-31 DIAGNOSIS — C481 Malignant neoplasm of specified parts of peritoneum: Secondary | ICD-10-CM

## 2017-08-31 DIAGNOSIS — T451X5A Adverse effect of antineoplastic and immunosuppressive drugs, initial encounter: Secondary | ICD-10-CM

## 2017-08-31 DIAGNOSIS — C786 Secondary malignant neoplasm of retroperitoneum and peritoneum: Secondary | ICD-10-CM

## 2017-08-31 DIAGNOSIS — E538 Deficiency of other specified B group vitamins: Secondary | ICD-10-CM

## 2017-08-31 LAB — CBC WITH DIFFERENTIAL/PLATELET
BASO%: 0.7 % (ref 0.0–2.0)
BASOS ABS: 0 10*3/uL (ref 0.0–0.1)
EOS ABS: 0 10*3/uL (ref 0.0–0.5)
EOS%: 0.8 % (ref 0.0–7.0)
HEMATOCRIT: 25.6 % — AB (ref 34.8–46.6)
HEMOGLOBIN: 8.4 g/dL — AB (ref 11.6–15.9)
LYMPH#: 0.2 10*3/uL — AB (ref 0.9–3.3)
LYMPH%: 12.2 % — ABNORMAL LOW (ref 14.0–49.7)
MCH: 31.1 pg (ref 25.1–34.0)
MCHC: 32.6 g/dL (ref 31.5–36.0)
MCV: 95.4 fL (ref 79.5–101.0)
MONO#: 0.2 10*3/uL (ref 0.1–0.9)
MONO%: 16.4 % — AB (ref 0.0–14.0)
NEUT%: 69.9 % (ref 38.4–76.8)
NEUTROS ABS: 1 10*3/uL — AB (ref 1.5–6.5)
Platelets: 104 10*3/uL — ABNORMAL LOW (ref 145–400)
RBC: 2.69 10*6/uL — ABNORMAL LOW (ref 3.70–5.45)
RDW: 17.2 % — AB (ref 11.2–14.5)
WBC: 1.5 10*3/uL — AB (ref 3.9–10.3)

## 2017-08-31 LAB — COMPREHENSIVE METABOLIC PANEL
ALT: 29 U/L (ref 0–55)
ANION GAP: 13 meq/L — AB (ref 3–11)
AST: 26 U/L (ref 5–34)
Albumin: 3.6 g/dL (ref 3.5–5.0)
Alkaline Phosphatase: 98 U/L (ref 40–150)
BILIRUBIN TOTAL: 0.49 mg/dL (ref 0.20–1.20)
BUN: 21.3 mg/dL (ref 7.0–26.0)
CALCIUM: 9.8 mg/dL (ref 8.4–10.4)
CHLORIDE: 103 meq/L (ref 98–109)
CO2: 23 mEq/L (ref 22–29)
CREATININE: 1.1 mg/dL (ref 0.6–1.1)
EGFR: 55 mL/min/{1.73_m2} — AB (ref >60)
Glucose: 139 mg/dl (ref 70–140)
Potassium: 4.7 mEq/L (ref 3.5–5.1)
SODIUM: 139 meq/L (ref 136–145)
TOTAL PROTEIN: 7.4 g/dL (ref 6.4–8.3)

## 2017-08-31 MED ORDER — SODIUM CHLORIDE 0.9% FLUSH
10.0000 mL | Freq: Once | INTRAVENOUS | Status: AC
  Administered 2017-08-31: 10 mL
  Filled 2017-08-31: qty 10

## 2017-08-31 NOTE — Patient Instructions (Signed)
Neutropenia Neutropenia is a condition that occurs when you have a lower-than-normal level of a type of white blood cell (neutrophil) in your body. Neutrophils are made in the spongy center of large bones (bone marrow) and they fight infections. Neutrophils are your body's main defense against bacterial and fungal infections. The fewer neutrophils you have and the longer your body remains without them, the greater your risk of getting a severe infection. What are the causes? This condition can occur if your body uses up or destroys neutrophils faster than your bone marrow can make them. This problem may happen because of:  Bacterial or fungal infection.  Allergic disorders.  Reactions to some medicines.  Autoimmune disease.  An enlarged spleen.  This condition can also occur if your bone marrow does not produce enough neutrophils. This problem may be caused by:  Cancer.  Cancer treatments, such as radiation or chemotherapy.  Viral infections.  Medicines, such as phenytoin.  Vitamin B12 deficiency.  Diseases of the bone marrow.  Environmental toxins, such as insecticides.  What are the signs or symptoms? This condition does not usually cause symptoms. If symptoms are present, they are usually caused by an underlying infection. Symptoms of an infection may include:  Fever.  Chills.  Swollen glands.  Oral or anal ulcers.  Cough and shortness of breath.  Rash.  Skin infection.  Fatigue.  How is this diagnosed? Your health care provider may suspect neutropenia if you have:  A condition that may cause neutropenia.  Symptoms of infection, especially fever.  Frequent and unusual infections.  You will have a medical history and physical exam. Tests will also be done, such as:  A complete blood count (CBC).  A procedure to collect a sample of bone marrow for examination (bone marrow biopsy).  A chest X-ray.  A urine culture.  A blood culture.  How is this  treated? Treatment depends on the underlying cause and severity of your condition. Mild neutropenia may not require treatment. Treatment may include medicines, such as:  Antibiotic medicine given through an IV tube.  Antiviral medicines.  Antifungal medicines.  A medicine to increase neutrophil production (colony-stimulating factor). You may get this drug through an IV tube or by injection.  Steroids given through an IV tube.  If an underlying condition is causing neutropenia, you may need treatment for that condition. If medicines you are taking are causing neutropenia, your health care provider may have you stop taking those medicines. Follow these instructions at home: Medicines  Take over-the-counter and prescription medicines only as told by your health care provider.  Get a seasonal flu shot (influenza vaccine). Lifestyle  Do not eat unpasteurized foods.Do not eat unwashed raw fruits or vegetables.  Avoid exposure to groups of people or children.  Avoid being around people who are sick.  Avoid being around dirt or dust, such as in construction areas or gardens.  Do not provide direct care for pets. Avoid animal droppings. Do not clean litter boxes and bird cages. Hygiene   Bathe daily.  Clean the area between the genitals and the anus (perineal area) after you urinate or have a bowel movement. If you are female, wipe from front to back.  Brush your teeth with a soft toothbrush before and after meals.  Do not use a razor that has a blade. Use an electric razor to remove hair.  Wash your hands often. Make sure others who come in contact with you also wash their hands. If soap and water  are not available, use hand sanitizer. General instructions  Do not have sex unless your health care provider has approved.  Take actions to avoid cuts and burns. For example: ? Be cautious when you use knives. Always cut away from yourself. ? Keep knives in protective sheaths or  guards when not in use. ? Use oven mitts when you cook with a hot stove, oven, or grill. ? Stand a safe distance away from open fires.  Avoid people who received a vaccine in the past 30 days if that vaccine contained a live version of the germ (live vaccine). You should not get a live vaccine. Common live vaccines are varicella, measles, mumps, and rubella.  Do not share food utensils.  Do not use tampons, enemas, or rectal suppositories unless your health care provider has approved.  Keep all appointments as told by your health care provider. This is important. Contact a health care provider if:  You have a fever.  You have chills or you start to shake.  You have: ? A sore throat. ? A warm, red, or tender area on your skin. ? A cough. ? Frequent or painful urination. ? Vaginal discharge or itching.  You develop: ? Sores in your mouth or anus. ? Swollen lymph nodes. ? Red streaks on the skin. ? A rash.  You feel: ? Nauseous or you vomit. ? Very fatigued. ? Short of breath. This information is not intended to replace advice given to you by your health care provider. Make sure you discuss any questions you have with your health care provider. Document Released: 04/11/2002 Document Revised: 03/27/2016 Document Reviewed: 05/02/2015 Elsevier Interactive Patient Education  2018 [employer]  

## 2017-08-31 NOTE — Progress Notes (Signed)
Reviewed pt labs with Dr. Alvy Bimler ([place], Hemoglobin and platelets) Pt not to receive treatment today and no transfusions indicated. Pt port flushed/heparinized and deaccessed. Reviewed pt labs with pt and pt aware of neutropenia. Neutropenic precautions reviewed with pt who verbalized understanding and had no further questions. Vitals stable and WNL. Pt aware to call with any questions or concerns.  Pt left ambulatory to waiting room in no apparent distress.

## 2017-08-31 NOTE — Patient Instructions (Signed)
Implanted Port Home Guide An implanted port is a type of central line that is placed under the skin. Central lines are used to provide IV access when treatment or nutrition needs to be given through a person's veins. Implanted ports are used for long-term IV access. An implanted port may be placed because:  You need IV medicine that would be irritating to the small veins in your hands or arms.  You need long-term IV medicines, such as antibiotics.  You need IV nutrition for a long period.  You need frequent blood draws for lab tests.  You need dialysis.  Implanted ports are usually placed in the chest area, but they can also be placed in the upper arm, the abdomen, or the leg. An implanted port has two main parts:  Reservoir. The reservoir is round and will appear as a small, raised area under your skin. The reservoir is the part where a needle is inserted to give medicines or draw blood.  Catheter. The catheter is a thin, flexible tube that extends from the reservoir. The catheter is placed into a large vein. Medicine that is inserted into the reservoir goes into the catheter and then into the vein.  How will I care for my incision site? Do not get the incision site wet. Bathe or shower as directed by your health care provider. How is my port accessed? Special steps must be taken to access the port:  Before the port is accessed, a numbing cream can be placed on the skin. This helps numb the skin over the port site.  Your health care provider uses a sterile technique to access the port. ? Your health care provider must put on a mask and sterile gloves. ? The skin over your port is cleaned carefully with an antiseptic and allowed to dry. ? The port is gently pinched between sterile gloves, and a needle is inserted into the port.  Only "non-coring" port needles should be used to access the port. Once the port is accessed, a blood return should be checked. This helps ensure that the port  is in the vein and is not clogged.  If your port needs to remain accessed for a constant infusion, a clear (transparent) bandage will be placed over the needle site. The bandage and needle will need to be changed every week, or as directed by your health care provider.  Keep the bandage covering the needle clean and dry. Do not get it wet. Follow your health care provider's instructions on how to take a shower or bath while the port is accessed.  If your port does not need to stay accessed, no bandage is needed over the port.  What is flushing? Flushing helps keep the port from getting clogged. Follow your health care provider's instructions on how and when to flush the port. Ports are usually flushed with saline solution or a medicine called heparin. The need for flushing will depend on how the port is used.  If the port is used for intermittent medicines or blood draws, the port will need to be flushed: ? After medicines have been given. ? After blood has been drawn. ? As part of routine maintenance.  If a constant infusion is running, the port may not need to be flushed.  How long will my port stay implanted? The port can stay in for as long as your health care provider thinks it is needed. When it is time for the port to come out, surgery will be   done to remove it. The procedure is similar to the one performed when the port was put in. When should I seek immediate medical care? When you have an implanted port, you should seek immediate medical care if:  You notice a bad smell coming from the incision site.  You have swelling, redness, or drainage at the incision site.  You have more swelling or pain at the port site or the surrounding area.  You have a fever that is not controlled with medicine.  This information is not intended to replace advice given to you by your health care provider. Make sure you discuss any questions you have with your health care provider. Document  Released: 10/20/2005 Document Revised: 03/27/2016 Document Reviewed: 06/27/2013 Elsevier Interactive Patient Education  2017 [employer]  

## 2017-09-07 ENCOUNTER — Ambulatory Visit: Payer: BC Managed Care – PPO

## 2017-09-07 ENCOUNTER — Other Ambulatory Visit (HOSPITAL_BASED_OUTPATIENT_CLINIC_OR_DEPARTMENT_OTHER): Payer: BC Managed Care – PPO

## 2017-09-07 ENCOUNTER — Ambulatory Visit (HOSPITAL_BASED_OUTPATIENT_CLINIC_OR_DEPARTMENT_OTHER): Payer: BC Managed Care – PPO

## 2017-09-07 VITALS — BP 136/71 | HR 85 | Temp 98.1°F | Resp 18

## 2017-09-07 DIAGNOSIS — C481 Malignant neoplasm of specified parts of peritoneum: Secondary | ICD-10-CM

## 2017-09-07 DIAGNOSIS — C786 Secondary malignant neoplasm of retroperitoneum and peritoneum: Secondary | ICD-10-CM

## 2017-09-07 DIAGNOSIS — D6481 Anemia due to antineoplastic chemotherapy: Secondary | ICD-10-CM

## 2017-09-07 DIAGNOSIS — C801 Malignant (primary) neoplasm, unspecified: Secondary | ICD-10-CM

## 2017-09-07 DIAGNOSIS — Z5111 Encounter for antineoplastic chemotherapy: Secondary | ICD-10-CM | POA: Diagnosis not present

## 2017-09-07 DIAGNOSIS — T451X5A Adverse effect of antineoplastic and immunosuppressive drugs, initial encounter: Secondary | ICD-10-CM

## 2017-09-07 DIAGNOSIS — E538 Deficiency of other specified B group vitamins: Secondary | ICD-10-CM

## 2017-09-07 LAB — CBC WITH DIFFERENTIAL/PLATELET
BASO%: 0.8 % (ref 0.0–2.0)
BASOS ABS: 0 10*3/uL (ref 0.0–0.1)
EOS ABS: 0.2 10*3/uL (ref 0.0–0.5)
EOS%: 5.3 % (ref 0.0–7.0)
HEMATOCRIT: 29.9 % — AB (ref 34.8–46.6)
HEMOGLOBIN: 9.4 g/dL — AB (ref 11.6–15.9)
LYMPH#: 0.4 10*3/uL — AB (ref 0.9–3.3)
LYMPH%: 10.8 % — ABNORMAL LOW (ref 14.0–49.7)
MCH: 31.8 pg (ref 25.1–34.0)
MCHC: 31.4 g/dL — ABNORMAL LOW (ref 31.5–36.0)
MCV: 101 fL (ref 79.5–101.0)
MONO#: 0.4 10*3/uL (ref 0.1–0.9)
MONO%: 12.2 % (ref 0.0–14.0)
NEUT%: 70.9 % (ref 38.4–76.8)
NEUTROS ABS: 2.6 10*3/uL (ref 1.5–6.5)
PLATELETS: 200 10*3/uL (ref 145–400)
RBC: 2.96 10*6/uL — ABNORMAL LOW (ref 3.70–5.45)
RDW: 19.6 % — AB (ref 11.2–14.5)
WBC: 3.6 10*3/uL — AB (ref 3.9–10.3)

## 2017-09-07 LAB — COMPREHENSIVE METABOLIC PANEL
ALBUMIN: 3.9 g/dL (ref 3.5–5.0)
ALK PHOS: 97 U/L (ref 40–150)
ALT: 20 U/L (ref 0–55)
ANION GAP: 11 meq/L (ref 3–11)
AST: 19 U/L (ref 5–34)
BILIRUBIN TOTAL: 0.32 mg/dL (ref 0.20–1.20)
BUN: 22 mg/dL (ref 7.0–26.0)
CALCIUM: 9.7 mg/dL (ref 8.4–10.4)
CO2: 25 mEq/L (ref 22–29)
Chloride: 104 mEq/L (ref 98–109)
Creatinine: 1 mg/dL (ref 0.6–1.1)
EGFR: 58 mL/min/{1.73_m2} — AB (ref >60)
GLUCOSE: 117 mg/dL (ref 70–140)
POTASSIUM: 4.6 meq/L (ref 3.5–5.1)
SODIUM: 139 meq/L (ref 136–145)
TOTAL PROTEIN: 7.5 g/dL (ref 6.4–8.3)

## 2017-09-07 MED ORDER — PROCHLORPERAZINE MALEATE 10 MG PO TABS
10.0000 mg | ORAL_TABLET | Freq: Once | ORAL | Status: AC
  Administered 2017-09-07: 10 mg via ORAL

## 2017-09-07 MED ORDER — PROCHLORPERAZINE MALEATE 10 MG PO TABS
ORAL_TABLET | ORAL | Status: AC
  Filled 2017-09-07: qty 1

## 2017-09-07 MED ORDER — SODIUM CHLORIDE 0.9% FLUSH
10.0000 mL | Freq: Once | INTRAVENOUS | Status: AC
  Administered 2017-09-07: 10 mL
  Filled 2017-09-07: qty 10

## 2017-09-07 MED ORDER — SODIUM CHLORIDE 0.9 % IV SOLN
600.0000 mg/m2 | Freq: Once | INTRAVENOUS | Status: AC
  Administered 2017-09-07: 1254 mg via INTRAVENOUS
  Filled 2017-09-07: qty 32.98

## 2017-09-07 MED ORDER — HEPARIN SOD (PORK) LOCK FLUSH 100 UNIT/ML IV SOLN
500.0000 [IU] | Freq: Once | INTRAVENOUS | Status: AC | PRN
  Administered 2017-09-07: 500 [IU]
  Filled 2017-09-07: qty 5

## 2017-09-07 MED ORDER — SODIUM CHLORIDE 0.9 % IV SOLN
Freq: Once | INTRAVENOUS | Status: AC
  Administered 2017-09-07: 12:00:00 via INTRAVENOUS

## 2017-09-07 MED ORDER — SODIUM CHLORIDE 0.9% FLUSH
10.0000 mL | INTRAVENOUS | Status: DC | PRN
  Administered 2017-09-07: 10 mL
  Filled 2017-09-07: qty 10

## 2017-09-07 NOTE — Patient Instructions (Signed)
[facility] °Discharge Instructions for Patients Receiving Chemotherapy ° °Today you received the following chemotherapy agents Gemzar ° °To help prevent nausea and vomiting after your treatment, we encourage you to take your nausea medication as directed. °  °If you develop nausea and vomiting that is not controlled by your nausea medication, call the clinic.  ° °BELOW ARE SYMPTOMS THAT SHOULD BE REPORTED IMMEDIATELY: °· *FEVER GREATER THAN 100.5 F °· *CHILLS WITH OR WITHOUT FEVER °· NAUSEA AND VOMITING THAT IS NOT CONTROLLED WITH YOUR NAUSEA MEDICATION °· *UNUSUAL SHORTNESS OF BREATH °· *UNUSUAL BRUISING OR BLEEDING °· TENDERNESS IN MOUTH AND THROAT WITH OR WITHOUT PRESENCE OF ULCERS °· *URINARY PROBLEMS °· *BOWEL PROBLEMS °· UNUSUAL RASH °Items with * indicate a potential emergency and should be followed up as soon as possible. ° °Feel free to call the clinic should you have any questions or concerns. The clinic phone number is [phone]. ° °Please show the CHEMO ALERT CARD at check-in to the Emergency Department and triage nurse. ° ° °

## 2017-09-19 ENCOUNTER — Other Ambulatory Visit: Payer: Self-pay | Admitting: Pediatrics

## 2017-09-19 DIAGNOSIS — Z794 Long term (current) use of insulin: Secondary | ICD-10-CM

## 2017-09-19 DIAGNOSIS — I10 Essential (primary) hypertension: Secondary | ICD-10-CM

## 2017-09-19 DIAGNOSIS — E119 Type 2 diabetes mellitus without complications: Secondary | ICD-10-CM

## 2017-09-19 DIAGNOSIS — E785 Hyperlipidemia, unspecified: Secondary | ICD-10-CM

## 2017-09-21 ENCOUNTER — Ambulatory Visit (HOSPITAL_BASED_OUTPATIENT_CLINIC_OR_DEPARTMENT_OTHER): Payer: BC Managed Care – PPO

## 2017-09-21 ENCOUNTER — Telehealth: Payer: Self-pay | Admitting: Hematology and Oncology

## 2017-09-21 ENCOUNTER — Other Ambulatory Visit (HOSPITAL_BASED_OUTPATIENT_CLINIC_OR_DEPARTMENT_OTHER): Payer: BC Managed Care – PPO

## 2017-09-21 ENCOUNTER — Encounter: Payer: Self-pay | Admitting: Hematology and Oncology

## 2017-09-21 ENCOUNTER — Telehealth: Payer: Self-pay | Admitting: Pediatrics

## 2017-09-21 ENCOUNTER — Ambulatory Visit: Payer: BC Managed Care – PPO

## 2017-09-21 ENCOUNTER — Ambulatory Visit (HOSPITAL_BASED_OUTPATIENT_CLINIC_OR_DEPARTMENT_OTHER): Payer: BC Managed Care – PPO | Admitting: Hematology and Oncology

## 2017-09-21 VITALS — BP 123/65 | HR 88 | Temp 98.7°F | Resp 18 | Ht 62.0 in | Wt 203.3 lb

## 2017-09-21 DIAGNOSIS — C569 Malignant neoplasm of unspecified ovary: Secondary | ICD-10-CM

## 2017-09-21 DIAGNOSIS — C801 Malignant (primary) neoplasm, unspecified: Secondary | ICD-10-CM | POA: Diagnosis not present

## 2017-09-21 DIAGNOSIS — C786 Secondary malignant neoplasm of retroperitoneum and peritoneum: Secondary | ICD-10-CM

## 2017-09-21 DIAGNOSIS — C481 Malignant neoplasm of specified parts of peritoneum: Secondary | ICD-10-CM

## 2017-09-21 DIAGNOSIS — Z5111 Encounter for antineoplastic chemotherapy: Secondary | ICD-10-CM

## 2017-09-21 DIAGNOSIS — T451X5A Adverse effect of antineoplastic and immunosuppressive drugs, initial encounter: Secondary | ICD-10-CM

## 2017-09-21 DIAGNOSIS — Z794 Long term (current) use of insulin: Secondary | ICD-10-CM | POA: Diagnosis not present

## 2017-09-21 DIAGNOSIS — D6481 Anemia due to antineoplastic chemotherapy: Secondary | ICD-10-CM | POA: Diagnosis not present

## 2017-09-21 DIAGNOSIS — E119 Type 2 diabetes mellitus without complications: Secondary | ICD-10-CM | POA: Diagnosis not present

## 2017-09-21 DIAGNOSIS — E785 Hyperlipidemia, unspecified: Secondary | ICD-10-CM

## 2017-09-21 DIAGNOSIS — I10 Essential (primary) hypertension: Secondary | ICD-10-CM

## 2017-09-21 DIAGNOSIS — E538 Deficiency of other specified B group vitamins: Secondary | ICD-10-CM

## 2017-09-21 LAB — CBC WITH DIFFERENTIAL/PLATELET
BASO%: 0.8 % (ref 0.0–2.0)
BASOS ABS: 0 10*3/uL (ref 0.0–0.1)
EOS ABS: 0.5 10*3/uL (ref 0.0–0.5)
EOS%: 11.2 % — ABNORMAL HIGH (ref 0.0–7.0)
HCT: 30.6 % — ABNORMAL LOW (ref 34.8–46.6)
HGB: 10 g/dL — ABNORMAL LOW (ref 11.6–15.9)
LYMPH%: 7.2 % — AB (ref 14.0–49.7)
MCH: 33.2 pg (ref 25.1–34.0)
MCHC: 32.5 g/dL (ref 31.5–36.0)
MCV: 102.1 fL — AB (ref 79.5–101.0)
MONO#: 0.6 10*3/uL (ref 0.1–0.9)
MONO%: 13.6 % (ref 0.0–14.0)
NEUT%: 67.2 % (ref 38.4–76.8)
NEUTROS ABS: 2.9 10*3/uL (ref 1.5–6.5)
PLATELETS: 168 10*3/uL (ref 145–400)
RBC: 3 10*6/uL — AB (ref 3.70–5.45)
RDW: 22.6 % — ABNORMAL HIGH (ref 11.2–14.5)
WBC: 4.2 10*3/uL (ref 3.9–10.3)
lymph#: 0.3 10*3/uL — ABNORMAL LOW (ref 0.9–3.3)

## 2017-09-21 LAB — COMPREHENSIVE METABOLIC PANEL
ALBUMIN: 3.9 g/dL (ref 3.5–5.0)
ALK PHOS: 87 U/L (ref 40–150)
ALT: 19 U/L (ref 0–55)
ANION GAP: 12 meq/L — AB (ref 3–11)
AST: 19 U/L (ref 5–34)
BILIRUBIN TOTAL: 0.36 mg/dL (ref 0.20–1.20)
BUN: 27.9 mg/dL — ABNORMAL HIGH (ref 7.0–26.0)
CO2: 25 meq/L (ref 22–29)
Calcium: 9.8 mg/dL (ref 8.4–10.4)
Chloride: 104 mEq/L (ref 98–109)
Creatinine: 1.1 mg/dL (ref 0.6–1.1)
EGFR: 54 mL/min/{1.73_m2} — AB (ref >60)
Glucose: 115 mg/dl (ref 70–140)
POTASSIUM: 4.3 meq/L (ref 3.5–5.1)
SODIUM: 141 meq/L (ref 136–145)
TOTAL PROTEIN: 7.4 g/dL (ref 6.4–8.3)

## 2017-09-21 MED ORDER — SODIUM CHLORIDE 0.9% FLUSH
10.0000 mL | Freq: Once | INTRAVENOUS | Status: AC
  Administered 2017-09-21: 10 mL
  Filled 2017-09-21: qty 10

## 2017-09-21 MED ORDER — PROCHLORPERAZINE MALEATE 10 MG PO TABS
10.0000 mg | ORAL_TABLET | Freq: Once | ORAL | Status: AC
  Administered 2017-09-21: 10 mg via ORAL

## 2017-09-21 MED ORDER — HEPARIN SOD (PORK) LOCK FLUSH 100 UNIT/ML IV SOLN
500.0000 [IU] | Freq: Once | INTRAVENOUS | Status: AC | PRN
  Administered 2017-09-21: 500 [IU]
  Filled 2017-09-21: qty 5

## 2017-09-21 MED ORDER — PROCHLORPERAZINE MALEATE 10 MG PO TABS
ORAL_TABLET | ORAL | Status: AC
  Filled 2017-09-21: qty 1

## 2017-09-21 MED ORDER — SODIUM CHLORIDE 0.9 % IV SOLN
Freq: Once | INTRAVENOUS | Status: AC
  Administered 2017-09-21: 11:00:00 via INTRAVENOUS

## 2017-09-21 MED ORDER — LIDOCAINE-PRILOCAINE 2.5-2.5 % EX CREA: TOPICAL_CREAM | CUTANEOUS | 3 refills | Status: DC

## 2017-09-21 MED ORDER — OMEPRAZOLE 40 MG PO CPDR: 40.0000 mg | DELAYED_RELEASE_CAPSULE | Freq: Every day | ORAL | 3 refills | Status: DC

## 2017-09-21 MED ORDER — SODIUM CHLORIDE 0.9% FLUSH
10.0000 mL | INTRAVENOUS | Status: DC | PRN
  Administered 2017-09-21: 10 mL
  Filled 2017-09-21: qty 10

## 2017-09-21 MED ORDER — BENAZEPRIL HCL 10 MG PO TABS: ORAL_TABLET | ORAL | 0 refills | Status: DC

## 2017-09-21 MED ORDER — HYDROCODONE-ACETAMINOPHEN 10-325 MG PO TABS: 1.0000 | ORAL_TABLET | ORAL | 0 refills | Status: DC | PRN

## 2017-09-21 MED ORDER — SODIUM CHLORIDE 0.9 % IV SOLN
600.0000 mg/m2 | Freq: Once | INTRAVENOUS | Status: AC
  Administered 2017-09-21: 1254 mg via INTRAVENOUS
  Filled 2017-09-21: qty 32.98

## 2017-09-21 MED ORDER — ATORVASTATIN CALCIUM 40 MG PO TABS: ORAL_TABLET | ORAL | 0 refills | Status: DC

## 2017-09-21 NOTE — Telephone Encounter (Signed)
90 day supply sent to pharmacy

## 2017-09-21 NOTE — Patient Instructions (Signed)
Implanted Port Home Guide An implanted port is a type of central line that is placed under the skin. Central lines are used to provide IV access when treatment or nutrition needs to be given through a person's veins. Implanted ports are used for long-term IV access. An implanted port may be placed because:  You need IV medicine that would be irritating to the small veins in your hands or arms.  You need long-term IV medicines, such as antibiotics.  You need IV nutrition for a long period.  You need frequent blood draws for lab tests.  You need dialysis.  Implanted ports are usually placed in the chest area, but they can also be placed in the upper arm, the abdomen, or the leg. An implanted port has two main parts:  Reservoir. The reservoir is round and will appear as a small, raised area under your skin. The reservoir is the part where a needle is inserted to give medicines or draw blood.  Catheter. The catheter is a thin, flexible tube that extends from the reservoir. The catheter is placed into a large vein. Medicine that is inserted into the reservoir goes into the catheter and then into the vein.  How will I care for my incision site? Do not get the incision site wet. Bathe or shower as directed by your health care provider. How is my port accessed? Special steps must be taken to access the port:  Before the port is accessed, a numbing cream can be placed on the skin. This helps numb the skin over the port site.  Your health care provider uses a sterile technique to access the port. ? Your health care provider must put on a mask and sterile gloves. ? The skin over your port is cleaned carefully with an antiseptic and allowed to dry. ? The port is gently pinched between sterile gloves, and a needle is inserted into the port.  Only "non-coring" port needles should be used to access the port. Once the port is accessed, a blood return should be checked. This helps ensure that the port  is in the vein and is not clogged.  If your port needs to remain accessed for a constant infusion, a clear (transparent) bandage will be placed over the needle site. The bandage and needle will need to be changed every week, or as directed by your health care provider.  Keep the bandage covering the needle clean and dry. Do not get it wet. Follow your health care provider's instructions on how to take a shower or bath while the port is accessed.  If your port does not need to stay accessed, no bandage is needed over the port.  What is flushing? Flushing helps keep the port from getting clogged. Follow your health care provider's instructions on how and when to flush the port. Ports are usually flushed with saline solution or a medicine called heparin. The need for flushing will depend on how the port is used.  If the port is used for intermittent medicines or blood draws, the port will need to be flushed: ? After medicines have been given. ? After blood has been drawn. ? As part of routine maintenance.  If a constant infusion is running, the port may not need to be flushed.  How long will my port stay implanted? The port can stay in for as long as your health care provider thinks it is needed. When it is time for the port to come out, surgery will be   done to remove it. The procedure is similar to the one performed when the port was put in. When should I seek immediate medical care? When you have an implanted port, you should seek immediate medical care if:  You notice a bad smell coming from the incision site.  You have swelling, redness, or drainage at the incision site.  You have more swelling or pain at the port site or the surrounding area.  You have a fever that is not controlled with medicine.  This information is not intended to replace advice given to you by your health care provider. Make sure you discuss any questions you have with your health care provider. Document  Released: 10/20/2005 Document Revised: 03/27/2016 Document Reviewed: 06/27/2013 Elsevier Interactive Patient Education  2017 [employer]  

## 2017-09-21 NOTE — Telephone Encounter (Signed)
Scheduled appt per 11/19 los - Gave patient AVS and calender per los.  

## 2017-09-21 NOTE — Telephone Encounter (Signed)
Authorize 30 days only. Then contact the patient letting them know that they will need an appointment before any further prescriptions can be sent in. 

## 2017-09-21 NOTE — Assessment & Plan Note (Signed)
Her blood sugar control is stable while on treatment She will continue insulin treatment as directed

## 2017-09-21 NOTE — Assessment & Plan Note (Signed)

## 2017-09-21 NOTE — Patient Instructions (Addendum)
[facility] °Discharge Instructions for Patients Receiving Chemotherapy ° °Today you received the following chemotherapy agents Gemzar ° °To help prevent nausea and vomiting after your treatment, we encourage you to take your nausea medication as directed. °  °If you develop nausea and vomiting that is not controlled by your nausea medication, call the clinic.  ° °BELOW ARE SYMPTOMS THAT SHOULD BE REPORTED IMMEDIATELY: °· *FEVER GREATER THAN 100.5 F °· *CHILLS WITH OR WITHOUT FEVER °· NAUSEA AND VOMITING THAT IS NOT CONTROLLED WITH YOUR NAUSEA MEDICATION °· *UNUSUAL SHORTNESS OF BREATH °· *UNUSUAL BRUISING OR BLEEDING °· TENDERNESS IN MOUTH AND THROAT WITH OR WITHOUT PRESENCE OF ULCERS °· *URINARY PROBLEMS °· *BOWEL PROBLEMS °· UNUSUAL RASH °Items with * indicate a potential emergency and should be followed up as soon as possible. ° °Feel free to call the clinic should you have any questions or concerns. The clinic phone number is [phone]. ° °Please show the CHEMO ALERT CARD at check-in to the Emergency Department and triage nurse. ° ° °

## 2017-09-21 NOTE — Assessment & Plan Note (Signed)
The patient had recurrent pancytopenia despite dose adjustment I plan to modify her chemotherapy schedule to give her on treatment on days 1, 8 and rest day 15, with a cycle every 21 days She will continue reduced dose treatment I plan to repeat CT scan next month before I see her back So far, tumor markers are improving with treatment

## 2017-09-21 NOTE — Progress Notes (Signed)
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 This may potentially be ovarian or a GI in etiology.      04/30/2016 Tumor Marker    CA 125- 7149.0 (H)      05/01/2016 Procedure    US paracentesis- Successful ultrasound-guided paracentesis yielding 1.8 liters of peritoneal fluid.      05/01/2016 Imaging    US pelvis- Both transabdominal and transvaginal sonography are significantly limited by large patient habitus and ascites. Neither uterus or ovaries were visualized on this exam.      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      05/08/2016 Imaging    CT chest- No evidence of metastatic disease in the chest. Peritoneal/omental disease with abdominal ascites in the upper abdomen, incompletely visualized.       05/13/2016 Procedure    Placement of single lumen port a cath via right internal jugular vein. The catheter tip lies at the cavoatrial junction. A power injectable port a cath was placed and is ready for immediate use.      05/15/2016 Procedure    US Paracentesis- 3400 ml yellow colored ascites removed      05/15/2016 - 09/18/2016 Chemotherapy    Carboplatin/Paclitaxel every 21 days x 7 cycles      07/01/2016 Miscellaneous    Genetic Counseling by [name]-  Genetic testing was normal, and did not reveal a deleterious mutation in these genes.       07/08/2016 Imaging    CT CAP- 1. Small volume ascites, significantly  decreased. 2. Stable diffuse omental soft tissue caking and diffuse peritoneal thickening along the bilateral paracolic gutters and bilateral pelvic peritoneal reflections, consistent with peritoneal carcinomatosis. 3. Stable asymmetrically enlarged right ovary, which may represent the primary site of ovarian malignancy. 4. No evidence of metastatic disease in the chest. No new sites of metastatic disease in the abdomen or pelvis.      07/09/2016 Miscellaneous    Gyn Onc re-evaluation- modest response to therapy, 3 more cycles of chemotherapy recommended.        09/11/2016 Imaging    CT C/A/P No significant change omental soft tissue caking, consistent with metastatic disease. Mild ascites is decreased since previous study.  Increased calcification along peritoneal surface in pelvic cul-de-sac, consistent with treated peritoneal metastatic disease.  Stable 4.5cm homogeneous right pelvic mass, which favors a uterine fibroid although right ovarian neoplasm cannot definitely be excluded.  No new or progressive metastatic disease identified. No evidence of metastatic disease within the thorax.       10/14/2016 Procedure    Robotic-assisted laparoscopic total hysterectomy with bilateral salpingoophorectomy, ex lap omentectomy, radical tumor debulking by Dr. Denman George      10/17/2016 Pathology Results    Diagnosis 1. 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 - CERVIX AND ENDOMETRIUM FREE OF TUMOR. - SEE ONCOLOGY TABLE AND COMMENT. 2. Soft tissue, biopsy, umbilical nodule - HIGH GRADE SEROUS CARCINOMA. 3.  Omentum, resection for tumor - HIGH GRADE SEROUS CARCINOMA, 33 CM.      11/06/2016 - 12/25/2016 Chemotherapy    Carboplatin/Paclitaxel x 3 cycles       01/12/2017 Imaging    CT CAP- 1. Interval hysterectomy, bilateral salpingo-oophorectomy and omentectomy without evidence of  tumor recurrence. 2. No evidence of metastatic disease. 3. 5 mm nonobstructing lower pole left renal calculus.      01/13/2017 Remission    No evidence of residual disease on CT imaging.      05/04/2017 Imaging    CT CAP- New small amount of ascites within the abdomen and pelvis since 01/12/2017 which could indicate disease progression but no new identifiable tumor and no significant change in omental and mild peritoneal thickening.  No evidence of metastatic disease within the chest.  Coronary artery disease.  Aortic Atherosclerosis (ICD10-I70.0).      05/04/2017 Tumor Marker    Patient's tumor was tested for the following markers: CA 125 Results of the tumor marker test revealed 5885      05/18/2017 Imaging    ECHO; EF 60% -  65      05/25/2017 - 07/07/2017 Chemotherapy    She received Doxil and Avastin. Treatment is stopped due to disease progression      06/01/2017 Procedure    Successful ultrasound-guided therapeutic paracentesis yielding 2.8 liters of peritoneal fluid      06/22/2017 Tumor Marker    Patient's tumor was tested for the following markers: CA 125 Results of the tumor marker test revealed 13440      07/20/2017 Imaging    Mild increase in peritoneal carcinoma within abdomen pelvis since previous study. No significant change and minimal ascites.  No evidence of metastatic disease within the thorax. New mild airspace opacity in left lower lobe, consistent with inflammatory or infectious etiology.      07/30/2017 Tumor Marker    Patient's tumor was tested for the following markers: CA 125 Results of the tumor marker test revealed 12099      07/30/2017 -  Chemotherapy    The patient had gemzar      08/03/2017 - 08/05/2017 Hospital Admission    She was admitted to the hospital for management of UTI and neutropenic fever      08/13/2017 Adverse Reaction    Dose of chemotherapy is reduced due to neutropenic sepsis      08/24/2017 Tumor Marker     Patient's tumor was tested for the following markers: CA 125 Results of the tumor marker test revealed 7431       INTERVAL HISTORY: Please see below for problem oriented charting. She returns with her husband for further follow-up She tolerated last cycle of treatment well No recent infection The patient denies any recent signs or symptoms of bleeding such as spontaneous epistaxis, hematuria or hematochezia. She has an appointment pending to see a new endocrinologist for diabetes management So far, she has not experienced major side effects of treatment such as nausea, vomiting or changes in bowel habits  REVIEW OF SYSTEMS:   Constitutional: Denies fevers, chills or abnormal weight loss Eyes: Denies blurriness of vision Ears, nose, mouth, throat, and face: Denies mucositis or sore throat Respiratory: Denies cough, dyspnea or wheezes Cardiovascular: Denies palpitation, chest discomfort or lower extremity swelling Gastrointestinal:  Denies nausea, heartburn or change in bowel habits Skin: Denies abnormal skin rashes Lymphatics: Denies new lymphadenopathy or easy bruising Neurological:Denies numbness, tingling or new weaknesses Behavioral/Psych: Mood is stable, no new changes  All other systems were reviewed with the patient and are negative.  I have reviewed the past medical history, past surgical history, social history and family history with the patient and they are unchanged from previous note.  ALLERGIES:  is allergic to avandia [rosiglitazone]; micronase [glyburide]; and actos [pioglitazone].  MEDICATIONS:  Current Outpatient Medications  Medication Sig Dispense Refill  . atorvastatin (LIPITOR) 40 MG tablet TAKE 1 TABLET(40 MG) BY MOUTH DAILY 30 tablet 0  . benazepril (LOTENSIN) 10 MG tablet TAKE 1 TABLET(10 MG) BY MOUTH DAILY 30 tablet 0  . furosemide (LASIX) 20 MG tablet TAKE 1 TABLET(20 MG) BY MOUTH TWICE DAILY 90 tablet 0  . HYDROcodone-acetaminophen (NORCO) 10-325 MG  tablet Take 1 tablet every 4 (four) hours as needed by mouth. 90 tablet 0  . lidocaine-prilocaine (EMLA) cream Apply a quarter size amount to port site 1 hour prior to chemo. Do not rub in. Cover with plastic wrap. 30 g 3  . loperamide (IMODIUM A-D) 2 MG tablet Take 1 tablet (2 mg total) by mouth 4 (four) times daily as needed for diarrhea or loose stools. 30 tablet 0  . metFORMIN (GLUCOPHAGE) 1000 MG tablet TAKE 1 TABLET BY MOUTH TWICE DAILY WITH A MEAL 180 tablet 1  . metFORMIN (GLUCOPHAGE) 1000 MG tablet TAKE 1 TABLET BY MOUTH TWICE DAILY WITH A MEAL 60 tablet 0  . Multiple Vitamin (MULTIVITAMIN WITH MINERALS) TABS Take 1 tablet by mouth daily.    . multivitamin-lutein (OCUVITE-LUTEIN) CAPS capsule Take 1 capsule by mouth daily.    Marland Kitchen NOVOLOG 100 UNIT/ML injection USE IN INSULIN PUMP AS DIRECTED. MAX DAILY DOSE OF 110 UNITS PER DAY 120 mL 0  . omeprazole (PRILOSEC) 40 MG capsule Take 1 capsule (40 mg total) daily by mouth. 90 capsule 3  . ondansetron (ZOFRAN) 8 MG tablet Take by mouth every 8 (eight) hours as needed for nausea or vomiting.    . ONE TOUCH ULTRA TEST test strip USE TO CHECK BLOOD SUGAR UP TO 5 TIMES A DAY 450 each 2  . polyethylene glycol (MIRALAX / GLYCOLAX) packet Take 17 g by mouth daily as needed for mild constipation.     . senna (SENOKOT) 8.6 MG TABS tablet Take 1 tablet (8.6 mg total) by mouth at bedtime as needed for moderate constipation. 120 each 0  . vitamin B-12 (CYANOCOBALAMIN) 1000 MCG tablet Take 1,000 mcg by mouth daily.    . vitamin E (VITAMIN E) 400 UNIT capsule Take 400 Units by mouth daily.     No current facility-administered medications for this visit.    Facility-Administered Medications Ordered in Other Visits  Medication Dose Route Frequency Provider Last Rate Last Dose  . sodium chloride flush (NS) 0.9 % injection 10 mL  10 mL Intracatheter PRN Alvy Bimler, Mateja Dier, MD   10 mL at 09/21/17 1258    PHYSICAL EXAMINATION: ECOG PERFORMANCE STATUS: 1 - Symptomatic  but completely ambulatory  Vitals:   09/21/17 1005  BP: 123/65  Pulse: 88  Resp: 18  Temp: 98.7 F (37.1 C)  SpO2: 99%   Filed Weights   09/21/17 1005  Weight: 203 lb 4.8 oz (92.2 kg)    GENERAL:alert, no distress and comfortable.  She is morbidly obese  sKIN: skin color, texture, turgor are normal, no rashes or significant lesions EYES: normal, Conjunctiva are pink and non-injected, sclera clear OROPHARYNX:no exudate, no erythema and lips, buccal mucosa, and tongue normal  NECK: supple, thyroid normal size, non-tender, without nodularity LYMPH:  no palpable lymphadenopathy  in the cervical, axillary or inguinal LUNGS: clear to auscultation and percussion with normal breathing effort HEART: regular rate & rhythm and no murmurs and no lower extremity edema ABDOMEN:abdomen soft, non-tender and normal bowel sounds Musculoskeletal:no cyanosis of digits and no clubbing  NEURO: alert & oriented x 3 with fluent speech, no focal motor/sensory deficits  LABORATORY DATA:  I have reviewed the data as listed    Component Value Date/Time   NA 141 09/21/2017 0924   K 4.3 09/21/2017 0924   CL 114 (H) 08/05/2017 0448   CO2 25 09/21/2017 0924   GLUCOSE 115 09/21/2017 0924   BUN 27.9 (H) 09/21/2017 0924   CREATININE 1.1 09/21/2017 0924   CALCIUM 9.8 09/21/2017 0924   PROT 7.4 09/21/2017 0924   ALBUMIN 3.9 09/21/2017 0924   AST 19 09/21/2017 0924   ALT 19 09/21/2017 0924   ALKPHOS 87 09/21/2017 0924   BILITOT 0.36 09/21/2017 0924   GFRNONAA 60 (L) 08/05/2017 0448   GFRAA >60 08/05/2017 0448    No results found for: SPEP, UPEP  Lab Results  Component Value Date   WBC 4.2 09/21/2017   NEUTROABS 2.9 09/21/2017   HGB 10.0 (L) 09/21/2017   HCT 30.6 (L) 09/21/2017   MCV 102.1 (H) 09/21/2017   PLT 168 09/21/2017      Chemistry      Component Value Date/Time   NA 141 09/21/2017 0924   K 4.3 09/21/2017 0924   CL 114 (H) 08/05/2017 0448   CO2 25 09/21/2017 0924   BUN 27.9 (H)  09/21/2017 0924   CREATININE 1.1 09/21/2017 0924      Component Value Date/Time   CALCIUM 9.8 09/21/2017 0924   ALKPHOS 87 09/21/2017 0924   AST 19 09/21/2017 0924   ALT 19 09/21/2017 0924   BILITOT 0.36 09/21/2017 0924      ASSESSMENT & PLAN:  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 The patient denies recent history of bleeding such as epistaxis, hematuria or hematochezia. She is asymptomatic from the anemia. I will observe for now.  She does not require transfusion now. I will continue the chemotherapy at current dose without dosage adjustment.  If the anemia gets progressive worse in the future, I might have to delay her treatment or adjust the chemotherapy dose.   Type 2 diabetes mellitus treated with insulin ([facility]) Her blood sugar control is stable while on treatment She will continue insulin treatment as directed   Orders Placed This Encounter  Procedures  . CT ABDOMEN PELVIS W CONTRAST    Standing Status:   Future    Standing Expiration Date:   09/21/2018    Order Specific Question:   If indicated for the ordered procedure, I authorize the administration of contrast media per Radiology protocol    Answer:   Yes    Order Specific Question:   Preferred imaging location?    Answer:   [facility]    Order Specific Question:   Radiology Contrast Protocol - do NOT remove file path    Answer:   [email or web address]  All questions were answered. The patient knows to call the clinic with any problems, questions or  concerns. No barriers to learning was detected. I spent 15 minutes counseling the patient face to face. The total time spent in the appointment was 20 minutes and more than 50% was on counseling and review of test results     Heath Lark, MD 09/21/2017 3:15 PM

## 2017-09-21 NOTE — Telephone Encounter (Signed)
last seen 03/25/17  Dr Evette Doffing

## 2017-09-22 ENCOUNTER — Telehealth: Payer: Self-pay | Admitting: *Deleted

## 2017-09-22 LAB — CA 125

## 2017-09-22 NOTE — Telephone Encounter (Signed)
Notified of message below

## 2017-09-22 NOTE — Telephone Encounter (Signed)
-----   Message from Heath Lark, MD sent at 09/22/2017  8:11 AM EST ----- Regarding: CA-125 Let her know the level is dropping nicely ----- Message ----- From: Interface, Lab In Three Zero One Sent: 09/21/2017   9:52 AM To: Heath Lark, MD

## 2017-09-25 ENCOUNTER — Encounter: Payer: Self-pay | Admitting: Hematology and Oncology

## 2017-09-28 ENCOUNTER — Other Ambulatory Visit (HOSPITAL_BASED_OUTPATIENT_CLINIC_OR_DEPARTMENT_OTHER): Payer: BC Managed Care – PPO

## 2017-09-28 ENCOUNTER — Other Ambulatory Visit: Payer: Self-pay | Admitting: Hematology and Oncology

## 2017-09-28 ENCOUNTER — Ambulatory Visit (HOSPITAL_BASED_OUTPATIENT_CLINIC_OR_DEPARTMENT_OTHER): Payer: BC Managed Care – PPO

## 2017-09-28 VITALS — BP 125/49 | HR 87 | Temp 98.2°F | Resp 18 | Wt 201.8 lb

## 2017-09-28 DIAGNOSIS — C801 Malignant (primary) neoplasm, unspecified: Secondary | ICD-10-CM

## 2017-09-28 DIAGNOSIS — C786 Secondary malignant neoplasm of retroperitoneum and peritoneum: Secondary | ICD-10-CM | POA: Diagnosis not present

## 2017-09-28 DIAGNOSIS — C481 Malignant neoplasm of specified parts of peritoneum: Secondary | ICD-10-CM

## 2017-09-28 DIAGNOSIS — Z5111 Encounter for antineoplastic chemotherapy: Secondary | ICD-10-CM | POA: Diagnosis not present

## 2017-09-28 DIAGNOSIS — T451X5A Adverse effect of antineoplastic and immunosuppressive drugs, initial encounter: Secondary | ICD-10-CM

## 2017-09-28 DIAGNOSIS — D6481 Anemia due to antineoplastic chemotherapy: Secondary | ICD-10-CM

## 2017-09-28 LAB — CBC WITH DIFFERENTIAL/PLATELET
BASO%: 1.3 % (ref 0.0–2.0)
BASOS ABS: 0 10*3/uL (ref 0.0–0.1)
EOS ABS: 0.1 10*3/uL (ref 0.0–0.5)
EOS%: 3.9 % (ref 0.0–7.0)
HCT: 27.7 % — ABNORMAL LOW (ref 34.8–46.6)
HGB: 8.9 g/dL — ABNORMAL LOW (ref 11.6–15.9)
LYMPH%: 16.2 % (ref 14.0–49.7)
MCH: 33.5 pg (ref 25.1–34.0)
MCHC: 32.1 g/dL (ref 31.5–36.0)
MCV: 104.1 fL — AB (ref 79.5–101.0)
MONO#: 0.2 10*3/uL (ref 0.1–0.9)
MONO%: 15.6 % — AB (ref 0.0–14.0)
NEUT#: 1 10*3/uL — ABNORMAL LOW (ref 1.5–6.5)
NEUT%: 63 % (ref 38.4–76.8)
PLATELETS: 178 10*3/uL (ref 145–400)
RBC: 2.66 10*6/uL — ABNORMAL LOW (ref 3.70–5.45)
RDW: 18.8 % — ABNORMAL HIGH (ref 11.2–14.5)
WBC: 1.5 10*3/uL — ABNORMAL LOW (ref 3.9–10.3)
lymph#: 0.3 10*3/uL — ABNORMAL LOW (ref 0.9–3.3)

## 2017-09-28 LAB — COMPREHENSIVE METABOLIC PANEL
ALK PHOS: 90 U/L (ref 40–150)
ALT: 21 U/L (ref 0–55)
ANION GAP: 12 meq/L — AB (ref 3–11)
AST: 21 U/L (ref 5–34)
Albumin: 3.6 g/dL (ref 3.5–5.0)
BILIRUBIN TOTAL: 0.28 mg/dL (ref 0.20–1.20)
BUN: 25.1 mg/dL (ref 7.0–26.0)
CALCIUM: 9.6 mg/dL (ref 8.4–10.4)
CO2: 24 mEq/L (ref 22–29)
Chloride: 105 mEq/L (ref 98–109)
Creatinine: 0.9 mg/dL (ref 0.6–1.1)
GLUCOSE: 113 mg/dL (ref 70–140)
POTASSIUM: 4.6 meq/L (ref 3.5–5.1)
SODIUM: 140 meq/L (ref 136–145)
TOTAL PROTEIN: 7.3 g/dL (ref 6.4–8.3)

## 2017-09-28 MED ORDER — PROCHLORPERAZINE MALEATE 10 MG PO TABS
ORAL_TABLET | ORAL | Status: AC
  Filled 2017-09-28: qty 1

## 2017-09-28 MED ORDER — HEPARIN SOD (PORK) LOCK FLUSH 100 UNIT/ML IV SOLN
500.0000 [IU] | Freq: Once | INTRAVENOUS | Status: AC | PRN
  Administered 2017-09-28: 500 [IU]
  Filled 2017-09-28: qty 5

## 2017-09-28 MED ORDER — SODIUM CHLORIDE 0.9 % IV SOLN
600.0000 mg/m2 | Freq: Once | INTRAVENOUS | Status: AC
  Administered 2017-09-28: 1254 mg via INTRAVENOUS
  Filled 2017-09-28: qty 32.98

## 2017-09-28 MED ORDER — SODIUM CHLORIDE 0.9% FLUSH
10.0000 mL | INTRAVENOUS | Status: DC | PRN
  Administered 2017-09-28: 10 mL
  Filled 2017-09-28: qty 10

## 2017-09-28 MED ORDER — SODIUM CHLORIDE 0.9 % IV SOLN
Freq: Once | INTRAVENOUS | Status: AC
  Administered 2017-09-28: 10:00:00 via INTRAVENOUS

## 2017-09-28 MED ORDER — PROCHLORPERAZINE MALEATE 10 MG PO TABS
10.0000 mg | ORAL_TABLET | Freq: Once | ORAL | Status: AC
  Administered 2017-09-28: 10 mg via ORAL

## 2017-09-28 NOTE — Progress Notes (Signed)
Per Dr. Alvy Bimler, [place] to treat today with ANC  1.0 ,  Hbg  8.9 .  No need for blood transfusion; reinforced neutropenic precautions with pt and spouse.  Both voiced understanding.

## 2017-09-28 NOTE — Patient Instructions (Signed)
[facility] Discharge Instructions for Patients Receiving Chemotherapy  Today you received the following chemotherapy agents :  Gemcitabline.  To help prevent nausea and vomiting after your treatment, we encourage you to take your nausea medication as prescribed.   If you develop nausea and vomiting that is not controlled by your nausea medication, call the clinic.   BELOW ARE SYMPTOMS THAT SHOULD BE REPORTED IMMEDIATELY:  *FEVER GREATER THAN 100.5 F  *CHILLS WITH OR WITHOUT FEVER  NAUSEA AND VOMITING THAT IS NOT CONTROLLED WITH YOUR NAUSEA MEDICATION  *UNUSUAL SHORTNESS OF BREATH  *UNUSUAL BRUISING OR BLEEDING  TENDERNESS IN MOUTH AND THROAT WITH OR WITHOUT PRESENCE OF ULCERS  *URINARY PROBLEMS  *BOWEL PROBLEMS  UNUSUAL RASH Items with * indicate a potential emergency and should be followed up as soon as possible.  Feel free to call the clinic should you have any questions or concerns. The clinic phone number is [phone].  Please show the [facility] at check-in to the Emergency Department and triage nurse.

## 2017-09-29 ENCOUNTER — Encounter: Payer: Self-pay | Admitting: Internal Medicine

## 2017-09-29 ENCOUNTER — Ambulatory Visit: Payer: BC Managed Care – PPO | Admitting: Internal Medicine

## 2017-09-29 VITALS — BP 146/68 | HR 102 | Wt 202.6 lb

## 2017-09-29 DIAGNOSIS — E118 Type 2 diabetes mellitus with unspecified complications: Secondary | ICD-10-CM | POA: Diagnosis not present

## 2017-09-29 DIAGNOSIS — Z9641 Presence of insulin pump (external) (internal): Secondary | ICD-10-CM

## 2017-09-29 LAB — POCT GLYCOSYLATED HEMOGLOBIN (HGB A1C): Hemoglobin A1C: 5.5

## 2017-09-29 LAB — CA 125: Cancer Antigen (CA) 125: 4249 U/mL — ABNORMAL HIGH (ref 0.0–38.1)

## 2017-09-29 NOTE — Progress Notes (Signed)
Patient ID: Ellen Hunt, female   DOB: [birth year], 66 y.o.   MRN: [id number]   HPI: Ellen Hunt is a 66 y.o.-year-old female, referred by her PCP, Eustaquio Maize, MD, for management of DM2, dx 2002, insulin-dependent, uncontrolled, with complications (peripheral neuropathy).  She is here with her husband to [place] first part of the history especially about her past medical history of and diet.  Of note, patient has a history of ovarian cancer with extraovarian peritoneal carcinomatosis.  She has ascites and a high CA 125, however, last value was slightly better. Her WBC is low.  She is undergoing ChTx now, w/o Dexamethasone.  Last hemoglobin A1c was: Lab Results  Component Value Date   HGBA1C 6.2 (H) 10/09/2016   HGBA1C 6.8 10/03/2015   HGBA1C 6.9 07/02/2015   Pt is on an insulin pump: Medtr[employer] 722 - since 2012, without CGM, uses Novolog in the pump.  She is on: - Metformin 1000 mg twice a day  Pump settings: - basal rates: 12 am: 2.10 units/h 8 am: 2.15 5 pm: 1.90 - ICR: 8 - target:   12 am: 115-115  5 am: 100-100  8 pm: 115-115 - ISF: 13 - Insulin on Board: 4 h - bolus wizard: on TDD from basal insulin: 65% TDD from bolus insulin: 35% TDD 76.4 units - extended bolusing: not using - changes infusion site: q3 days - Meter: One Touch Ultra 2  Pt checks her sugars 2-6x a day and they are: - am: 117-158, 166, 181 - 2h after b'fast: n/c - before lunch: 81-136, 156 - 2h after lunch: n/c - before dinner: 86-179, 185 - 2h after dinner: 145 - bedtime: n/c - nighttime: 155-168 No lows. Lowest sugar was 60s; she has hypoglycemia awareness at 70. No previous hypoglycemia admission. She has a glucagon kit at home. Highest sugar was 400 during steroid treatment with previous chemotherapy regimen.   Pt's meals are: - Breakfast: egg + raisin bread + sometimes bacon - Lunch: varies, may eat out; tomato + cottage cheese - Dinner: meat + veggies + starch - Snacks:  bananas, oranges, PB, icecream Caffeine-free diet coke, Sprite 0  - no CKD, last BUN/creatinine:  Lab Results  Component Value Date   BUN 25.1 09/28/2017   BUN 27.9 (H) 09/21/2017   CREATININE 0.9 09/28/2017   CREATININE 1.1 09/21/2017  On benazepril. -+ Dyslipidemia; last set of lipids: Lab Results  Component Value Date   CHOL 129 03/29/2015   HDL 46 03/29/2015   LDLCALC 52 03/29/2015   LDLDIRECT 57 03/29/2015   TRIG 157 (H) 03/29/2015   CHOLHDL 2.8 03/29/2015  On atorvastatin. -+ Numbness and tingling in her feet.  ROS: Constitutional: no weight gain/loss, + fatigue, no subjective hyperthermia/hypothermia, + nocturia Eyes: no blurry vision, no xerophthalmia ENT: no sore throat, no nodules palpated in throat, no dysphagia/odynophagia, no hoarseness Cardiovascular: no CP/SOB/palpitations/leg swelling Respiratory: no cough/SOB Gastrointestinal: no N/V/+ D/+ C Musculoskeletal: + Muscle/+ joint aches - w/ Chemo Skin: no rashes, + hair loss Neurological: no tremors/+ numbness/+ tingling/no dizziness Psychiatric: no depression/anxiety  Past Medical History:  Diagnosis Date  . Diabetes mellitus without complication (HCC)    on insulin pump  . Dysrhythmia   . 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[place]) 05/09/2016  . Family history of breast cancer   . GERD (gastroesophageal reflux disease)   . History of blood transfusion   . History of bronchitis   . History of chemotherapy   . History of urinary tract infection   .  Hyperlipidemia   . Hypertension   . Low serum vitamin D   . Ovarian cancer ([place]) 05/09/2016  . Shingles    Past Surgical History:  Procedure Laterality Date  . ABDOMINAL HYSTERECTOMY  10/14/2016  . CESAREAN SECTION    . DEBULKING N/A 10/14/2016   Procedure: DEBULKING;  Surgeon: Everitt Amber, MD;  Location: [facility];  Service: Gynecology;  Laterality: N/A;  . IR PARACENTESIS  06/01/2017  . LAPAROTOMY WITH STAGING N/A 10/14/2016   Procedure: LAPAROTOMY WITH  OMENTECTOMY AND TUMOR DEBULGING;  Surgeon: Everitt Amber, MD;  Location: [facility];  Service: Gynecology;  Laterality: N/A;  . LUMBAR FUSION  08/21/15   L3-L4 Dr. Timmothy Euler  . OMENTECTOMY N/A 10/14/2016   Procedure: OMENTECTOMY;  Surgeon: Everitt Amber, MD;  Location: [facility];  Service: Gynecology;  Laterality: N/A;  . ROBOTIC ASSISTED TOTAL HYSTERECTOMY WITH BILATERAL SALPINGO OOPHERECTOMY Bilateral 10/14/2016   Procedure: XI ROBOTIC ASSISTED TOTAL LAPARSCOPIC  HYSTERECTOMY WITH BILATERAL SALPINGO OOPHORECTOMY;  Surgeon: Everitt Amber, MD;  Location: [facility];  Service: Gynecology;  Laterality: Bilateral;   Social History   Socioeconomic History  . Marital status: Married    Spouse name: Gershon Mussel  . Number of children: 2  Social Needs  Occupational History  . Occupation: Retired; [employer]  Tobacco Use  . Smoking status: Never Smoker  . Smokeless tobacco: Never Used  Substance and Sexual Activity  . Alcohol use: No  . Drug use: No  . Sexual activity: Not on file    Comment: married   Current Outpatient Medications on File Prior to Visit  Medication Sig Dispense Refill  . atorvastatin (LIPITOR) 40 MG tablet TAKE 1 TABLET(40 MG) BY MOUTH DAILY 90 tablet 0  . benazepril (LOTENSIN) 10 MG tablet TAKE 1 TABLET(10 MG) BY MOUTH DAILY 90 tablet 0  . furosemide (LASIX) 20 MG tablet TAKE 1 TABLET(20 MG) BY MOUTH TWICE DAILY 90 tablet 0  . HYDROcodone-acetaminophen (NORCO) 10-325 MG tablet Take 1 tablet every 4 (four) hours as needed by mouth. 90 tablet 0  . lidocaine-prilocaine (EMLA) cream Apply a quarter size amount to port site 1 hour prior to chemo. Do not rub in. Cover with plastic wrap. 30 g 3  . loperamide (IMODIUM A-D) 2 MG tablet Take 1 tablet (2 mg total) by mouth 4 (four) times daily as needed for diarrhea or loose stools. 30 tablet 0  . metFORMIN (GLUCOPHAGE) 1000 MG tablet TAKE 1 TABLET BY MOUTH TWICE DAILY WITH A MEAL 180 tablet 1  . Multiple Vitamin (MULTIVITAMIN WITH MINERALS) TABS  Take 1 tablet by mouth daily.    . multivitamin-lutein (OCUVITE-LUTEIN) CAPS capsule Take 1 capsule by mouth daily.    Marland Kitchen NOVOLOG 100 UNIT/ML injection USE IN INSULIN PUMP AS DIRECTED. MAX DAILY DOSE OF 110 UNITS PER DAY 120 mL 0  . omeprazole (PRILOSEC) 40 MG capsule Take 1 capsule (40 mg total) daily by mouth. 90 capsule 3  . ondansetron (ZOFRAN) 8 MG tablet Take by mouth every 8 (eight) hours as needed for nausea or vomiting.    . ONE TOUCH ULTRA TEST test strip USE TO CHECK BLOOD SUGAR UP TO 5 TIMES A DAY 450 each 2  . polyethylene glycol (MIRALAX / GLYCOLAX) packet Take 17 g by mouth daily as needed for mild constipation.     . senna (SENOKOT) 8.6 MG TABS tablet Take 1 tablet (8.6 mg total) by mouth at bedtime as needed for moderate constipation. 120 each 0  . vitamin B-12 (CYANOCOBALAMIN)  1000 MCG tablet Take 1,000 mcg by mouth daily.    . vitamin E (VITAMIN E) 400 UNIT capsule Take 400 Units by mouth daily.     No current facility-administered medications on file prior to visit.    Allergies  Allergen Reactions  . Avandia [Rosiglitazone] Other (See Comments)    Legs swelled  . Micronase [Glyburide] Swelling  . Actos [Pioglitazone] Other (See Comments)    Edema / leg swelling   Family History  Problem Relation Age of Onset  . Lung cancer Mother        smoker; dx in her 44s  . Leukemia Father   . Diabetes Paternal Grandmother   . Heart attack Paternal Grandmother   . Diabetes Paternal Grandfather   . Breast cancer Paternal Aunt        dx in her 43s-30s  . Leukemia Paternal Uncle   . Heart attack Maternal Grandfather   . Breast cancer Cousin        maternal first cousin    PE: BP (!) 146/68   Pulse (!) 102   Wt 202 lb 9.6 oz (91.9 kg)   SpO2 99%   BMI 37.06 kg/m  Wt Readings from Last 3 Encounters:  09/29/17 202 lb 9.6 oz (91.9 kg)  09/28/17 201 lb 12.8 oz (91.5 kg)  09/21/17 203 lb 4.8 oz (92.2 kg)   Constitutional: overweight, in NAD, pale Eyes: PERRLA, EOMI, no  exophthalmos ENT: moist mucous membranes, no thyromegaly, no cervical lymphadenopathy Cardiovascular: tachycardia, RR, No MRG Respiratory: CTA B Gastrointestinal: abdomen soft, NT, ND, BS+ Musculoskeletal: no deformities, strength intact in all 4 Skin: moist, warm, no rashes Neurological: no tremor with outstretched hands, DTR normal in all 4  ASSESSMENT: 1. DM1, uncontrolled, with complications - Peripheral neuropathy  PLAN:  1. Patient with long-standing, fairly well controlled type 2 diabetes, on insulin pump therapy. HbA1c today: 5.5%, excellent (however, she had a low Hb yesterday: 8.9, which can artificially lower the HbA1c) - we reviewed together with patient and her husband her insulin pump downloads, and it looks like she had slightly higher blood sugars in the morning and the sugars stay stable throughout the day at or close to the normal range.  We discussed the now that she is going through chemotherapy and she may not have the best eating regimen, I will suggest to continue the current regimen for now since she does not have extreme highs or lows and actually her sugars vary within a narrow range, which is desirable.  We also discussed about continuing with metformin, upon their questions.  I explained the benefits of continuing metformin and they agree to not stop it. - She loves her insulin pump, but it is out of warranty and I asked her to call [employer] and ask about changing the pump to a newer model. - We discussed about changes to his insulin regimen, as follows:  Patient Instructions  Please continue: - Metformin 1000 mg 2x a day  Continue same pump settings: - basal rates: 12 am: 2.10 units/h 8 am: 2.15 5 pm: 1.90 - ICR: 8 - target:   12 am: 115-115  5 am: 100-100  8 pm: 115-115 - insulin sensitivity factor: 13 - Insulin on Board: 4 h  Please return in 3 months with your sugar log.   - Strongly advised her to start checking sugars at different times of the  day - check at least 4 times a day, rotating checks - given foot care handout and  explained the principles  - given instructions for hypoglycemia management "15-15 rule"  - advised for yearly eye exams - She does have a non-expired glucagon kit at home - advised to get ketone strips - advised to always have Glu tablets with her -She may need a referral to DM education for further help with the pump: carb counting check, basal rate validation, extended bolusing, sick days rules, etc., but we can do this at next visit - given instruction Re: exercising and driving in DM (pt instructions) - Return to clinic in 3 mo with sugar log     Philemon Kingdom, MD PhD [facility] End[facility]

## 2017-09-29 NOTE — Patient Instructions (Addendum)
Please continue: - Metformin 1000 mg 2x a day  Continue same pump settings: - basal rates: 12 am: 2.10 units/h 8 am: 2.15 5 pm: 1.90 - ICR: 8 - target:   12 am: 115-115  5 am: 100-100  8 pm: 115-115 - insulin sensitivity factor: 13 - Insulin on Board: 4 h  Please return in 3 months with your sugar log.   Basic Rules for Patients with Type I Diabetes Mellitus  1. The American Diabetes Association (ADA) recommended targets: - fasting sugar <130 - after meal sugar <180 - HbA1C <7%  2. Engage in ?150 min moderate exercise per week  3. Make sure you have ?8h of sleep every night as this helps both blood sugars and your weight.  4. Always keep a sugar log (not only record in your meter) and bring it to all appointments with [place].  5. If you are on a pump, know how to access the settings and to modify the parameters.  6.  Remember, you can always call the number on the back of the pump for emergencies related to the pump.  7. "15-15 rule" for hypoglycemia: if sugars are low, take 15 g of carbs** ("fast sugar" - e.g. 4 glucose tablets, 4 oz orange juice), wait 15 min, then check sugars again. If still <80, repeat. Continue  until your sugars >80, then eat a normal meal.   8. Teach family members and coworkers to inject glucagon. Have a glucagon set at home and one at work. They should call 911 after using the set.  9. If you are on a pump, set "insulin on board" time for 5 hours (if your sugars tend to be higher, can use 4 hours).   10. If you are on a pump, use the "dual wave bolus" setting for high fat foods (e.g. pizza). Start with a setting of 50%-50% (50% instant bolus and 50% prolonged bolus over 3h, for e.g.).    11. If you are on a pump, make sure the basal daily insulin dose is approximately equal (not larger) to the daily insulin you get from boluses, otherwise you are at risk for hypoglycemia.  12. Check sugar before driving. If <100, correct, and only start driving if  sugars rise ?100. Check sugar every hour when on a long drive.  13. Check sugar before exercising. If <100, correct, and only start exercising if sugars rise ?100. Check sugar every hour when on a long exercise routine and 1h after you finished exercising.   If >250, check urine for ketones. If you have moderate-large ketones in urine, do not start exercise. Hydrate yourself with clear liquids and correct the high sugar. Recheck sugars and ketones before attempting to exercise.  Be aware that you might need less insulin when exercising.  *intense, short, exercise bursts can increase your sugars, but  *less intense, longer (>1h), exercise routines can decrease your sugars.  If you are on a pump, you might need to decrease your basal rate by 10% or more (or even disconnect your pump) while you exercise to prevent low sugars. Do not disconnect your pump by more than 3 hours at a time! You also might need to decrease your insulin bolus for the meal prior to your exercise time by 20% or more.  14. Make sure you have a MedAlert bracelet or pendant mentioning "Type I Diabetes Mellitus". If you have a prior episode of severe hypoglycemia or hypoglycemia unawareness, it should also mention this.  15. Please do  not walk barefoot. Inspect your feet for sores/cuts and let us know if you have them.  16. Please call [facility] with any questions and concerns [phone]).   **E.g. of "fast carbs": ? first choice (15 g):  1 tube glucose gel, GlucoPouch 15, 2 oz glucose liquid ? second choice (15-16 g):  3 or 4 glucose tablets (best taken  with water), 15 Dextrose Bits chewable ? third choice (15-20 g):   cup fruit juice,  cup regular soda, 1 cup skim milk,  1 cup sports drink ? fourth choice (15-20 g):  1 small tube [place] gel (not frosting), 2 tbsp raisins, 1 tbsp table sugar,  candy, jelly beans, gum drops - check package for carb amount   (adapted from: Lenice Pressman. "Insulin  therapy and hypoglycemia" Endocrinol Metab Clin N Am 2012, 41: 57-87)

## 2017-09-30 ENCOUNTER — Encounter (HOSPITAL_COMMUNITY): Payer: Self-pay | Admitting: Family Medicine

## 2017-09-30 ENCOUNTER — Emergency Department (HOSPITAL_COMMUNITY): Payer: BC Managed Care – PPO

## 2017-09-30 ENCOUNTER — Emergency Department (HOSPITAL_COMMUNITY)
Admission: EM | Admit: 2017-09-30 | Discharge: 2017-10-01 | Disposition: A | Discharge status: Home / Self Care | Payer: BC Managed Care – PPO | Attending: Emergency Medicine | Admitting: Emergency Medicine

## 2017-09-30 DIAGNOSIS — Z8543 Personal history of malignant neoplasm of ovary: Secondary | ICD-10-CM | POA: Diagnosis not present

## 2017-09-30 DIAGNOSIS — N390 Urinary tract infection, site not specified: Secondary | ICD-10-CM | POA: Diagnosis not present

## 2017-09-30 DIAGNOSIS — Z794 Long term (current) use of insulin: Secondary | ICD-10-CM | POA: Insufficient documentation

## 2017-09-30 DIAGNOSIS — I10 Essential (primary) hypertension: Secondary | ICD-10-CM | POA: Insufficient documentation

## 2017-09-30 DIAGNOSIS — R509 Fever, unspecified: Secondary | ICD-10-CM | POA: Diagnosis present

## 2017-09-30 DIAGNOSIS — Z79899 Other long term (current) drug therapy: Secondary | ICD-10-CM | POA: Insufficient documentation

## 2017-09-30 DIAGNOSIS — E119 Type 2 diabetes mellitus without complications: Secondary | ICD-10-CM | POA: Insufficient documentation

## 2017-09-30 LAB — URINALYSIS, ROUTINE W REFLEX MICROSCOPIC
Bilirubin Urine: NEGATIVE
GLUCOSE, UA: NEGATIVE mg/dL
Ketones, ur: NEGATIVE mg/dL
NITRITE: NEGATIVE
PH: 5 (ref 5.0–8.0)
PROTEIN: NEGATIVE mg/dL
SPECIFIC GRAVITY, URINE: 1.02 (ref 1.005–1.030)

## 2017-09-30 LAB — I-STAT CG4 LACTIC ACID, ED: Lactic Acid, Venous: 1.18 mmol/L (ref 0.5–1.9)

## 2017-09-30 LAB — CBC WITH DIFFERENTIAL/PLATELET
BASOS ABS: 0 10*3/uL (ref 0.0–0.1)
Basophils Relative: 0 %
EOS ABS: 0.1 10*3/uL (ref 0.0–0.7)
Eosinophils Relative: 1 %
HCT: 25.9 % — ABNORMAL LOW (ref 36.0–46.0)
HEMOGLOBIN: 8.4 g/dL — AB (ref 12.0–15.0)
LYMPHS ABS: 0.1 10*3/uL — AB (ref 0.7–4.0)
LYMPHS PCT: 2 %
MCH: 33.6 pg (ref 26.0–34.0)
MCHC: 32.4 g/dL (ref 30.0–36.0)
MCV: 103.6 fL — ABNORMAL HIGH (ref 78.0–100.0)
Monocytes Absolute: 0 10*3/uL — ABNORMAL LOW (ref 0.1–1.0)
Monocytes Relative: 1 %
NEUTROS PCT: 97 %
Neutro Abs: 6.9 10*3/uL (ref 1.7–7.7)
Platelets: 117 10*3/uL — ABNORMAL LOW (ref 150–400)
RBC: 2.5 MIL/uL — AB (ref 3.87–5.11)
RDW: 18.8 % — ABNORMAL HIGH (ref 11.5–15.5)
WBC: 7.1 10*3/uL (ref 4.0–10.5)

## 2017-09-30 LAB — COMPREHENSIVE METABOLIC PANEL
ALBUMIN: 3.5 g/dL (ref 3.5–5.0)
ALK PHOS: 68 U/L (ref 38–126)
ALT: 17 U/L (ref 14–54)
AST: 19 U/L (ref 15–41)
Anion gap: 9 (ref 5–15)
BUN: 33 mg/dL — AB (ref 6–20)
CHLORIDE: 104 mmol/L (ref 101–111)
CO2: 22 mmol/L (ref 22–32)
CREATININE: 1.21 mg/dL — AB (ref 0.44–1.00)
Calcium: 9 mg/dL (ref 8.9–10.3)
GFR calc Af Amer: 54 mL/min — ABNORMAL LOW (ref >60)
GFR calc non Af Amer: 46 mL/min — ABNORMAL LOW (ref >60)
GLUCOSE: 171 mg/dL — AB (ref 65–99)
Potassium: 4.8 mmol/L (ref 3.5–5.1)
SODIUM: 135 mmol/L (ref 135–145)
Total Bilirubin: 0.7 mg/dL (ref 0.3–1.2)
Total Protein: 6.7 g/dL (ref 6.5–8.1)

## 2017-09-30 LAB — PROTIME-INR
INR: 1.04
PROTHROMBIN TIME: 13.5 s (ref 11.4–15.2)

## 2017-09-30 MED ORDER — CEPHALEXIN 500 MG PO CAPS: 500.0000 mg | ORAL_CAPSULE | Freq: Three times a day (TID) | ORAL | 0 refills | Status: DC

## 2017-09-30 MED ORDER — DEXTROSE 5 % IV SOLN
1.0000 g | Freq: Once | INTRAVENOUS | Status: AC
  Administered 2017-09-30: 1 g via INTRAVENOUS
  Filled 2017-09-30: qty 10

## 2017-09-30 NOTE — Discharge Instructions (Signed)
You'll be contacted if your culture shows resistance to the antibiotic chosen to you. Return to ER with any worsening of your symptoms.

## 2017-09-30 NOTE — ED Notes (Signed)
Patient very upset about being told she needs a rectal temperature. Patient states she refuses a rectal temperature.

## 2017-09-30 NOTE — ED Notes (Signed)
Patient transported to X-ray 

## 2017-09-30 NOTE — ED Notes (Signed)
Writer mentioned to pt that we may check rectal temperature, and she adamantly refused.

## 2017-09-30 NOTE — ED Provider Notes (Signed)
[facility] DEPT Provider Note   CSN: [id number] Arrival date & time: 09/30/17  2026     History   Chief Complaint Chief Complaint  Patient presents with  . Fever    HPI Ellen Hunt is a 66 y.o. female. Chief complaint is fever, chemotherapy patient.  HPI: Temperature is 64. She was diagnosed with advanced ovarian cancer in 2017. She underwent staging robotic-assisted laparotomy for hysterectomy and debulking. She was on Taxol July, through February 2018. Had a chemotherapy holiday as her CA-125 decreased from 14,000 down to 100. It again rose the summer and she is started back on chemotherapy.  Her most recent chemotherapy was Monday of this week. Her dose was decreased as her white blood cell count had decreased from her previous week from 2000, down to 1000.  Today she had an episode of shakes and chills states her teeth were rattling together and she checked her temperature was 102. She is otherwise asymptomatic. She received a flu vaccine. No URI symptoms. No cough. No shortness of breath. No abdominal pain.  She has had UTIs in the past and been asymptomatic to them.  Past Medical History:  Diagnosis Date  . Diabetes mellitus without complication (HCC)    on insulin pump  . Dysrhythmia   . 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 Family history of breast cancer   . GERD (gastroesophageal reflux disease)   . History of blood transfusion   . History of bronchitis   . History of chemotherapy   . History of urinary tract infection   . Hyperlipidemia   . Hypertension   . Low serum vitamin D   . Ovarian cancer ([place]) 05/09/2016  . Shingles     Patient Active Problem List   Diagnosis Date Noted  . Elevated liver enzymes 08/25/2017  . Pancytopenia, acquired ([place]) 08/14/2017  . E-coli UTI 08/05/2017  . Thrombocytopenia ([place]) 08/04/2017  . Sepsis secondary to UTI ([place]) 08/04/2017  . AKI (acute kidney injury) ([place])  08/04/2017  . Goals of care, counseling/discussion 05/14/2017  . Edema leg 12/18/2016  . Influenza with pneumonia 11/20/2016  . Sepsis ([place]) 11/20/2016  . HCAP (healthcare-associated pneumonia) 11/19/2016  . Infection of urinary tract 10/14/2016  . B12 deficiency 08/31/2016  . Anemia due to antineoplastic chemotherapy 08/09/2016  . 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 Glaucoma 08/07/2016  . Genetic testing 06/27/2016  . Family history of breast cancer   . Shingles 05/09/2016  . 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 Nausea with vomiting 04/30/2016  . Sciatica neuralgia 02/12/2015  . Severe obesity (BMI >= 40) ([place]) 10/12/2014  . Hypertension 09/04/2014  . Obesity 09/04/2014  . Type 2 diabetes mellitus with complication, with long term current use of insulin pump ([place]) 01/25/2014  . Vitamin D insufficiency 01/25/2014  . Hyperlipidemia 01/25/2014  . Osteopenia 01/25/2014    Past Surgical History:  Procedure Laterality Date  . ABDOMINAL HYSTERECTOMY  10/14/2016  . CESAREAN SECTION    . DEBULKING N/A 10/14/2016   Procedure: DEBULKING;  Surgeon: Everitt Amber, MD;  Location: [facility] ORS;  Service: Gynecology;  Laterality: N/A;  . IR PARACENTESIS  06/01/2017  . LAPAROTOMY WITH STAGING N/A 10/14/2016   Procedure: LAPAROTOMY WITH OMENTECTOMY AND TUMOR DEBULGING;  Surgeon: Everitt Amber, MD;  Location: [facility];  Service: Gynecology;  Laterality: N/A;  . LUMBAR FUSION  08/21/15   L3-L4 Dr. Timmothy Euler  . OMENTECTOMY N/A 10/14/2016   Procedure: OMENTECTOMY;  Surgeon: Everitt Amber, MD;  Location: [facility];  Service: Gynecology;  Laterality: N/A;  . ROBOTIC ASSISTED TOTAL HYSTERECTOMY WITH BILATERAL SALPINGO OOPHERECTOMY Bilateral 10/14/2016   Procedure: XI ROBOTIC ASSISTED TOTAL LAPARSCOPIC  HYSTERECTOMY WITH BILATERAL SALPINGO OOPHORECTOMY;  Surgeon: Everitt Amber, MD;  Location: [facility];  Service: Gynecology;  Laterality: Bilateral;    OB History    No data  available       Home Medications    Prior to Admission medications   Medication Sig Start Date End Date Taking? Authorizing Provider  atorvastatin (LIPITOR) 40 MG tablet TAKE 1 TABLET(40 MG) BY MOUTH DAILY 09/21/17  Yes Eustaquio Maize, MD  benazepril (LOTENSIN) 10 MG tablet TAKE 1 TABLET(10 MG) BY MOUTH DAILY 09/21/17  Yes Eustaquio Maize, MD  furosemide (LASIX) 20 MG tablet TAKE 1 TABLET(20 MG) BY MOUTH TWICE DAILY 08/25/17  Yes Gorsuch, Ni, MD  HYDROcodone-acetaminophen (NORCO) 10-325 MG tablet Take 1 tablet every 4 (four) hours as needed by mouth. Patient taking differently: Take 1 tablet by mouth every 4 (four) hours as needed for moderate pain or severe pain.  09/21/17  Yes Gorsuch, Ni, MD  lidocaine-prilocaine (EMLA) cream Apply a quarter size amount to port site 1 hour prior to chemo. Do not rub in. Cover with plastic wrap. 09/21/17  Yes Gorsuch, Ni, MD  loperamide (IMODIUM A-D) 2 MG tablet Take 1 tablet (2 mg total) by mouth 4 (four) times daily as needed for diarrhea or loose stools. 08/05/17  Yes Dhungel, Nishant, MD  metFORMIN (GLUCOPHAGE) 1000 MG tablet TAKE 1 TABLET BY MOUTH TWICE DAILY WITH A MEAL 03/25/17  Yes Eustaquio Maize, MD  Multiple Vitamin (MULTIVITAMIN WITH MINERALS) TABS Take 1 tablet by mouth daily.   Yes [provider]  multivitamin-lutein (OCUVITE-LUTEIN) CAPS capsule Take 1 capsule by mouth daily.   Yes [provider]  NOVOLOG 100 UNIT/ML injection USE IN INSULIN PUMP AS DIRECTED. MAX DAILY DOSE OF 110 UNITS PER DAY 09/21/17  Yes Claretta Fraise, MD  omeprazole (PRILOSEC) 40 MG capsule Take 1 capsule (40 mg total) daily by mouth. 09/21/17  Yes Gorsuch, Ni, MD  vitamin B-12 (CYANOCOBALAMIN) 1000 MCG tablet Take 1,000 mcg by mouth daily.   Yes [provider]  vitamin E (VITAMIN E) 400 UNIT capsule Take 400 Units by mouth daily.   Yes [provider]  cephALEXin (KEFLEX) 500 MG capsule Take 1 capsule (500 mg total) by mouth 3  (three) times daily. 09/30/17   Tanna Furry, MD  loperamide (IMODIUM) 2 MG capsule  08/05/17   [provider]  ondansetron (ZOFRAN) 8 MG tablet Take by mouth every 8 (eight) hours as needed for nausea or vomiting.    [provider]  ONE TOUCH ULTRA TEST test strip USE TO CHECK BLOOD SUGAR UP TO 5 TIMES A DAY 12/26/16   [name], MD  polyethylene glycol [facility] / Floria Raveling) packet Take 17 g by mouth daily as needed for mild constipation.     [provider]  senna (SENOKOT) 8.6 MG TABS tablet Take 1 tablet (8.6 mg total) by mouth at bedtime as needed for moderate constipation. 10/15/16   Everitt Amber, MD    Family History Family History  Problem Relation Age of Onset  . Lung cancer Mother        smoker; dx in her 27s  . Leukemia Father   . Diabetes Paternal Grandmother   . Heart attack Paternal Grandmother   . Diabetes Paternal Grandfather   . Breast cancer Paternal Aunt  dx in her 20s-30s  . Leukemia Paternal Uncle   . Heart attack Maternal Grandfather   . Breast cancer Cousin        maternal first cousin    Social History Social History   Tobacco Use  . Smoking status: Never Smoker  . Smokeless tobacco: Never Used  Substance Use Topics  . Alcohol use: No  . Drug use: No     Allergies   Avandia [rosiglitazone]; Micronase [glyburide]; and Actos [pioglitazone]   Review of Systems Review of Systems  Constitutional: Positive for chills and fever. Negative for appetite change, diaphoresis and fatigue.  HENT: Negative for mouth sores, sore throat and trouble swallowing.   Eyes: Negative for visual disturbance.  Respiratory: Negative for cough, chest tightness, shortness of breath and wheezing.   Cardiovascular: Negative for chest pain.  Gastrointestinal: Negative for abdominal distention, abdominal pain, diarrhea, nausea and vomiting.  Endocrine: Negative for polydipsia, polyphagia and polyuria.  Genitourinary: Negative for dysuria,  frequency and hematuria.  Musculoskeletal: Negative for gait problem.  Skin: Negative for color change, pallor and rash.  Neurological: Negative for dizziness, syncope, light-headedness and headaches.  Hematological: Does not bruise/bleed easily.  Psychiatric/Behavioral: Negative for behavioral problems and confusion.     Physical Exam Updated Vital Signs BP (!) 130/55   Pulse (!) 113   Temp 99.7 F (37.6 C) (Oral)   Resp 15   Ht 5\' 3"  (1.6 m)   Wt 91.5 kg (201 lb 12.8 oz)   SpO2 94%   BMI 35.75 kg/m   Physical Exam  Constitutional: She is oriented to person, place, and time. She appears well-developed and well-nourished. No distress.  HENT:  Head: Normocephalic.  Eyes: Conjunctivae are normal. Pupils are equal, round, and reactive to light. No scleral icterus.  Neck: Normal range of motion. Neck supple. No thyromegaly present.  Cardiovascular: Normal rate and regular rhythm. Exam reveals no gallop and no friction rub.  No murmur heard. Pulmonary/Chest: Effort normal and breath sounds normal. No respiratory distress. She has no wheezes. She has no rales.  Abdominal: Soft. Bowel sounds are normal. She exhibits no distension. There is no tenderness. There is no rebound.  Musculoskeletal: Normal range of motion.  Neurological: She is alert and oriented to person, place, and time.  Skin: Skin is warm and dry. No rash noted.  Psychiatric: She has a normal mood and affect. Her behavior is normal.     ED Treatments / Results  Labs (all labs ordered are listed, but only abnormal results are displayed) Labs Reviewed  COMPREHENSIVE METABOLIC PANEL - Abnormal; Notable for the following components:      Result Value   Glucose, Bld 171 (*)    BUN 33 (*)    Creatinine, Ser 1.21 (*)    GFR calc non Af Amer 46 (*)    GFR calc Af Amer 54 (*)    All other components within normal limits  CBC WITH DIFFERENTIAL/PLATELET - Abnormal; Notable for the following components:   RBC 2.50 (*)     Hemoglobin 8.4 (*)    HCT 25.9 (*)    MCV 103.6 (*)    RDW 18.8 (*)    Platelets 117 (*)    Lymphs Abs 0.1 (*)    Monocytes Absolute 0.0 (*)    All other components within normal limits  URINALYSIS, ROUTINE W REFLEX MICROSCOPIC - Abnormal; Notable for the following components:   APPearance HAZY (*)    Hgb urine dipstick SMALL (*)  Leukocytes, UA LARGE (*)    Bacteria, UA MANY (*)    Squamous Epithelial / LPF 0-5 (*)    Non Squamous Epithelial 0-5 (*)    All other components within normal limits  CULTURE, BLOOD (ROUTINE X 2)  CULTURE, BLOOD (ROUTINE X 2)  URINE CULTURE  PROTIME-INR  INFLUENZA PANEL BY PCR (TYPE A & B)  I-STAT CG4 LACTIC ACID, ED    EKG  EKG Interpretation None       Radiology Dg Chest 2 View  Result Date: 09/30/2017 CLINICAL DATA:  66 y/o F; sudden onset fever. Patient receiving chemotherapy for ovarian cancer. EXAM: CHEST  2 VIEW COMPARISON:  08/04/2017 chest radiograph FINDINGS: Stable heart size and mediastinal contours are within normal limits. Both lungs are clear. Right port catheter tip is stable projecting over mid SVC. Calcifications along greater tuberosity of right humerus compatible with calcific tendinopathy of rotator cuff. IMPRESSION: No acute pulmonary process identified. Electronically Signed   By: Kristine Garbe M.D.   On: 09/30/2017 21:17    Procedures Procedures (including critical care time)  Medications Ordered in ED Medications  cefTRIAXone (ROCEPHIN) 1 g in dextrose 5 % 50 mL IVPB (1 g Intravenous New Bag/Given 09/30/17 2250)     Initial Impression / Assessment and Plan / ED Course  I have reviewed the triage vital signs and the nursing notes.  Pertinent labs & imaging results that were available during my care of the patient were reviewed by me and considered in my medical decision making (see chart for details).    She appears, and feels well. Her heart rate is 97. She is saturating well. Clear lungs. Normal  EKG and chest x-ray. Her CBC 7.197% neutrophils. Urine appears infected with too numerous to count white blood cells and bacteria. Nitrate negative. Culture of blood from port, blood from peripheral, and urine obtained. Given IV Rocephin.  Is strong desire to be treated at home. I'm comfortable with this. She isn't spent a diabetic and is on a insulin pump. She is very knowledgeable and comfortable with this. She had UTI in July that was Escherichia coli with some resistance. I reviewed this. She was initially started on Cipro then and changed to Keflex. We'll initiate Keflex now. Asked her to call Dr. Simeon Craft such her oncologist tomorrow for update and medially to the emergency room with recurrence or worsening.  Final Clinical Impressions(s) / ED Diagnoses   Final diagnoses:  Febrile illness  Urinary tract infection without hematuria, site unspecified    ED Discharge Orders        Ordered    cephALEXin (KEFLEX) 500 MG capsule  3 times daily     09/30/17 2312       Tanna Furry, MD 09/30/17 2332

## 2017-09-30 NOTE — ED Triage Notes (Signed)
Patient has ovarian cancer with last chemotherapy treatment on Monday. She started experiencing a fever at home today. The highest reading being 102.4 oral at home. She took her regular prescribed dose of Hydrocodone/APAP for her cancer pain that has decreased the fever. Patient complains of body aches.

## 2017-10-01 ENCOUNTER — Telehealth: Payer: Self-pay

## 2017-10-01 LAB — INFLUENZA PANEL BY PCR (TYPE A & B)
INFLAPCR: NEGATIVE
INFLBPCR: NEGATIVE

## 2017-10-01 MED ORDER — HEPARIN SOD (PORK) LOCK FLUSH 100 UNIT/ML IV SOLN
500.0000 [IU] | Freq: Once | INTRAVENOUS | Status: AC
  Administered 2017-10-01: 500 [IU]
  Filled 2017-10-01: qty 5

## 2017-10-01 NOTE — Telephone Encounter (Signed)
Called with below message. Instructed to call for questions. 

## 2017-10-01 NOTE — Telephone Encounter (Signed)
-----   Message from Heath Lark, MD sent at 10/01/2017  7:18 AM EST ----- Regarding: UTI Looks like she went to ER Prelim test showed probable UTI I am following culture Advise her to continue antibiotics as prescribed

## 2017-10-05 ENCOUNTER — Ambulatory Visit: Payer: BC Managed Care – PPO

## 2017-10-05 ENCOUNTER — Other Ambulatory Visit: Payer: BC Managed Care – PPO

## 2017-10-05 LAB — CULTURE, BLOOD (ROUTINE X 2)
CULTURE: NO GROWTH
Culture: NO GROWTH
Special Requests: ADEQUATE
Special Requests: ADEQUATE

## 2017-10-08 LAB — SUSCEPTIBILITY, AER + ANAEROB

## 2017-10-08 LAB — URINE CULTURE

## 2017-10-08 LAB — SUSCEPTIBILITY RESULT

## 2017-10-09 ENCOUNTER — Telehealth: Payer: Self-pay

## 2017-10-09 ENCOUNTER — Ambulatory Visit (HOSPITAL_COMMUNITY)
Admission: RE | Admit: 2017-10-09 | Discharge: 2017-10-09 | Disposition: A | Discharge status: Home / Self Care | Payer: BC Managed Care – PPO | Source: Ambulatory Visit | Attending: Hematology and Oncology | Admitting: Hematology and Oncology

## 2017-10-09 DIAGNOSIS — C786 Secondary malignant neoplasm of retroperitoneum and peritoneum: Secondary | ICD-10-CM | POA: Diagnosis present

## 2017-10-09 DIAGNOSIS — N200 Calculus of kidney: Secondary | ICD-10-CM | POA: Diagnosis not present

## 2017-10-09 DIAGNOSIS — C569 Malignant neoplasm of unspecified ovary: Secondary | ICD-10-CM

## 2017-10-09 DIAGNOSIS — C481 Malignant neoplasm of specified parts of peritoneum: Secondary | ICD-10-CM

## 2017-10-09 MED ORDER — HEPARIN SOD (PORK) LOCK FLUSH 100 UNIT/ML IV SOLN
INTRAVENOUS | Status: AC
  Filled 2017-10-09: qty 5

## 2017-10-09 MED ORDER — IOPAMIDOL (ISOVUE-300) INJECTION 61%
100.0000 mL | Freq: Once | INTRAVENOUS | Status: AC | PRN
  Administered 2017-10-09: 100 mL via INTRAVENOUS

## 2017-10-09 MED ORDER — IOPAMIDOL (ISOVUE-300) INJECTION 61%
INTRAVENOUS | Status: AC
  Filled 2017-10-09: qty 100

## 2017-10-09 MED ORDER — HEPARIN SOD (PORK) LOCK FLUSH 100 UNIT/ML IV SOLN
500.0000 [IU] | Freq: Once | INTRAVENOUS | Status: AC
  Administered 2017-10-09: 500 [IU] via INTRAVENOUS

## 2017-10-09 NOTE — Telephone Encounter (Signed)
Pt states she has not fever chills pain or burning. Has finished the Cephalexin. So further symptoms. If she does have symptoms then she would need Ciprofloxacin, see Pharm D note.

## 2017-10-09 NOTE — Progress Notes (Signed)
ED Antimicrobial Stewardship Positive Culture Follow Up   Ellen Hunt is an 66 y.o. female who presented to [facility] on 09/30/2017 with a chief complaint of  Chief Complaint  Patient presents with  . Fever    Recent Results (from the past 720 hour(s))  Culture, blood (Routine x 2)     Status: None   Collection Time: 09/30/17  8:48 PM  Result Value Ref Range Status   Specimen Description BLOOD RIGHT PORTA CATH  Final   Special Requests   Final    BOTTLES DRAWN AEROBIC AND ANAEROBIC Blood Culture adequate volume   Culture   Final    NO GROWTH 5 DAYS Performed at [facility], [address], [place], [place] [address]    Report Status 10/05/2017 FINAL  Final  Culture, blood (Routine x 2)     Status: None   Collection Time: 09/30/17  8:53 PM  Result Value Ref Range Status   Specimen Description BLOOD RIGHT HAND  Final   Special Requests   Final    BOTTLES DRAWN AEROBIC AND ANAEROBIC Blood Culture adequate volume   Culture   Final    NO GROWTH 5 DAYS Performed at [facility], [place] [address] [place], [place] [address]    Report Status 10/05/2017 FINAL  Final  Urine Culture     Status: Abnormal   Collection Time: 09/30/17  9:48 PM  Result Value Ref Range Status   Specimen Description URINE, CATHETERIZED  Final   Special Requests NONE  Final   Culture (A)  Final    >=100,000 COLONIES/mL ESCHERICHIA COLI 30,000 COLONIES/mL PSEUDOMONAS AERUGINOSA SEE SEPARATE REPORT FOR ADDITIONAL SUSCEPTIBILITY TESTING Performed at [facility], [place] [address], [place], [place] [address]    Report Status 10/08/2017 FINAL  Final   Organism ID, Bacteria PSEUDOMONAS AERUGINOSA (A)  Final      Susceptibility   Pseudomonas aeruginosa - MIC*    CEFTAZIDIME 4 SENSITIVE Sensitive     CIPROFLOXACIN <=0.25 SENSITIVE Sensitive     GENTAMICIN <=1 SENSITIVE Sensitive     IMIPENEM 1 SENSITIVE Sensitive     PIP/TAZO 8 SENSITIVE Sensitive     CEFEPIME 2 SENSITIVE Sensitive     *  30,000 COLONIES/mL PSEUDOMONAS AERUGINOSA  Susceptibility, Aer + Anaerob     Status: None   Collection Time: 09/30/17  9:48 PM  Result Value Ref Range Status   Suscept, Aer + Anaerob Final report  Corrected    Comment: (NOTE) Performed At: [facility] [address] [place], [place] [id number] Rush Farmer MD IF:[id number] CORRECTED ON 12/06 AT 0739: PREVIOUSLY REPORTED AS Preliminary report    Source of Sample URINE, RANDOM  Final    Comment: ESCHERICHIA COLI Performed at [facility], [place] [address], [place], [place] [address]   Susceptibility Result     Status: None   Collection Time: 09/30/17  9:48 PM  Result Value Ref Range Status   Suscept Result 1 Escherichia coli  Final    Comment: (NOTE) Identification performed by account, not confirmed by this laboratory. Cefazolin with an MIC <=16 predicts susceptibility to the oral agents cefaclor, cefdinir, cefpodoxime, cefprozil, cefuroxime, cephalexin, and loracarbef when used for therapy of uncomplicated urinary tract infections due to E. coli, Klebsiella pneumoniae, and Proteus mirabilis.    Antimicrobial Suscept Comment  Final    Comment: (NOTE)      ** S = Susceptible; I = Intermediate; R = Resistant **  P = Positive; N = Negative            MICS are expressed in micrograms per mL   Antibiotic                 RSLT#1    RSLT#2    RSLT#3    RSLT#4 Amoxicillin/Clavulanic Acid    I Ampicillin                     R Cefazolin                      S Cefepime                       S Ceftriaxone                    S Cefuroxime                     S Ciprofloxacin                  R Ertapenem                      S Gentamicin                     S Imipenem                       S Levofloxacin                   R Meropenem                      S Nitrofurantoin                 S Piperacillin/Tazobactam        S Tetracycline                   S Tobramycin                      S Trimethoprim/Sulfa             R Performed At: [facility] [place], [place] [id number] Rush Farmer MD [id number] Performed at [facility] [address], [place], [place] [address]     E. Coli already treated with Keflex  Flow manager will call patient and see if still reporting any fever, chills, or any urinary symptoms. If symptomatic give ciprofloxaxin 250mg  PO BID x 3 days for the pseudomonas.   ED Provider: Delia Heady PA-C   Reginia Naas 10/09/2017, 9:22 AM Infectious Diseases Pharmacist Phone# [phone]

## 2017-10-11 ENCOUNTER — Encounter: Payer: Self-pay | Admitting: Hematology and Oncology

## 2017-10-12 ENCOUNTER — Ambulatory Visit: Payer: BC Managed Care – PPO

## 2017-10-12 ENCOUNTER — Other Ambulatory Visit: Payer: BC Managed Care – PPO

## 2017-10-12 ENCOUNTER — Ambulatory Visit: Payer: BC Managed Care – PPO | Admitting: Hematology and Oncology

## 2017-10-19 ENCOUNTER — Other Ambulatory Visit: Payer: BC Managed Care – PPO

## 2017-10-19 ENCOUNTER — Ambulatory Visit (HOSPITAL_BASED_OUTPATIENT_CLINIC_OR_DEPARTMENT_OTHER): Payer: BC Managed Care – PPO | Admitting: Hematology and Oncology

## 2017-10-19 ENCOUNTER — Ambulatory Visit (HOSPITAL_BASED_OUTPATIENT_CLINIC_OR_DEPARTMENT_OTHER): Payer: BC Managed Care – PPO

## 2017-10-19 ENCOUNTER — Other Ambulatory Visit (HOSPITAL_BASED_OUTPATIENT_CLINIC_OR_DEPARTMENT_OTHER): Payer: BC Managed Care – PPO

## 2017-10-19 DIAGNOSIS — Z5111 Encounter for antineoplastic chemotherapy: Secondary | ICD-10-CM

## 2017-10-19 DIAGNOSIS — T451X5A Adverse effect of antineoplastic and immunosuppressive drugs, initial encounter: Secondary | ICD-10-CM

## 2017-10-19 DIAGNOSIS — D61818 Other pancytopenia: Secondary | ICD-10-CM | POA: Diagnosis not present

## 2017-10-19 DIAGNOSIS — C786 Secondary malignant neoplasm of retroperitoneum and peritoneum: Secondary | ICD-10-CM | POA: Diagnosis not present

## 2017-10-19 DIAGNOSIS — C801 Malignant (primary) neoplasm, unspecified: Secondary | ICD-10-CM

## 2017-10-19 DIAGNOSIS — C481 Malignant neoplasm of specified parts of peritoneum: Secondary | ICD-10-CM

## 2017-10-19 DIAGNOSIS — C569 Malignant neoplasm of unspecified ovary: Secondary | ICD-10-CM

## 2017-10-19 DIAGNOSIS — G620 Drug-induced polyneuropathy: Secondary | ICD-10-CM | POA: Diagnosis not present

## 2017-10-19 DIAGNOSIS — D6481 Anemia due to antineoplastic chemotherapy: Secondary | ICD-10-CM

## 2017-10-19 LAB — COMPREHENSIVE METABOLIC PANEL
ALT: 15 U/L (ref 0–55)
ANION GAP: 11 meq/L (ref 3–11)
AST: 18 U/L (ref 5–34)
Albumin: 3.8 g/dL (ref 3.5–5.0)
Alkaline Phosphatase: 93 U/L (ref 40–150)
BILIRUBIN TOTAL: 0.33 mg/dL (ref 0.20–1.20)
BUN: 27.9 mg/dL — ABNORMAL HIGH (ref 7.0–26.0)
CALCIUM: 9.5 mg/dL (ref 8.4–10.4)
CHLORIDE: 105 meq/L (ref 98–109)
CO2: 23 mEq/L (ref 22–29)
CREATININE: 1.1 mg/dL (ref 0.6–1.1)
EGFR: 53 mL/min/{1.73_m2} — ABNORMAL LOW (ref >60)
Glucose: 90 mg/dl (ref 70–140)
Potassium: 4.8 mEq/L (ref 3.5–5.1)
Sodium: 140 mEq/L (ref 136–145)
TOTAL PROTEIN: 7.1 g/dL (ref 6.4–8.3)

## 2017-10-19 LAB — CBC WITH DIFFERENTIAL/PLATELET
BASO%: 1 % (ref 0.0–2.0)
Basophils Absolute: 0 10*3/uL (ref 0.0–0.1)
EOS%: 8.4 % — AB (ref 0.0–7.0)
Eosinophils Absolute: 0.3 10*3/uL (ref 0.0–0.5)
HEMATOCRIT: 30.2 % — AB (ref 34.8–46.6)
HEMOGLOBIN: 9.8 g/dL — AB (ref 11.6–15.9)
LYMPH#: 0.3 10*3/uL — AB (ref 0.9–3.3)
LYMPH%: 7.2 % — ABNORMAL LOW (ref 14.0–49.7)
MCH: 34.2 pg — ABNORMAL HIGH (ref 25.1–34.0)
MCHC: 32.4 g/dL (ref 31.5–36.0)
MCV: 105.5 fL — ABNORMAL HIGH (ref 79.5–101.0)
MONO#: 0.9 10*3/uL (ref 0.1–0.9)
MONO%: 22.9 % — ABNORMAL HIGH (ref 0.0–14.0)
NEUT%: 60.5 % (ref 38.4–76.8)
NEUTROS ABS: 2.3 10*3/uL (ref 1.5–6.5)
PLATELETS: 242 10*3/uL (ref 145–400)
RBC: 2.87 10*6/uL — ABNORMAL LOW (ref 3.70–5.45)
RDW: 20.4 % — AB (ref 11.2–14.5)
WBC: 3.8 10*3/uL — AB (ref 3.9–10.3)

## 2017-10-19 MED ORDER — SODIUM CHLORIDE 0.9% FLUSH
10.0000 mL | INTRAVENOUS | Status: DC | PRN
  Administered 2017-10-19: 10 mL
  Filled 2017-10-19: qty 10

## 2017-10-19 MED ORDER — HYDROCODONE-ACETAMINOPHEN 10-325 MG PO TABS: 1.0000 | ORAL_TABLET | ORAL | 0 refills | Status: DC | PRN

## 2017-10-19 MED ORDER — HEPARIN SOD (PORK) LOCK FLUSH 100 UNIT/ML IV SOLN
500.0000 [IU] | Freq: Once | INTRAVENOUS | Status: AC | PRN
  Administered 2017-10-19: 500 [IU]
  Filled 2017-10-19: qty 5

## 2017-10-19 MED ORDER — ONDANSETRON HCL 8 MG PO TABS
8.0000 mg | ORAL_TABLET | Freq: Three times a day (TID) | ORAL | 3 refills | Status: DC | PRN
Start: 2017-10-19 — End: 2018-07-16

## 2017-10-19 MED ORDER — PROCHLORPERAZINE MALEATE 10 MG PO TABS
10.0000 mg | ORAL_TABLET | Freq: Once | ORAL | Status: AC
  Administered 2017-10-19: 10 mg via ORAL

## 2017-10-19 MED ORDER — PROCHLORPERAZINE MALEATE 10 MG PO TABS
ORAL_TABLET | ORAL | Status: AC
  Filled 2017-10-19: qty 1

## 2017-10-19 MED ORDER — SODIUM CHLORIDE 0.9 % IV SOLN
600.0000 mg/m2 | Freq: Once | INTRAVENOUS | Status: AC
  Administered 2017-10-19: 1254 mg via INTRAVENOUS
  Filled 2017-10-19: qty 32.98

## 2017-10-19 MED ORDER — SODIUM CHLORIDE 0.9 % IV SOLN
Freq: Once | INTRAVENOUS | Status: AC
  Administered 2017-10-19: 09:00:00 via INTRAVENOUS

## 2017-10-19 NOTE — Patient Instructions (Signed)
[facility] Discharge Instructions for Patients Receiving Chemotherapy  Today you received the following chemotherapy agents :  Gemcitabline.  To help prevent nausea and vomiting after your treatment, we encourage you to take your nausea medication as prescribed.   If you develop nausea and vomiting that is not controlled by your nausea medication, call the clinic.   BELOW ARE SYMPTOMS THAT SHOULD BE REPORTED IMMEDIATELY:  *FEVER GREATER THAN 100.5 F  *CHILLS WITH OR WITHOUT FEVER  NAUSEA AND VOMITING THAT IS NOT CONTROLLED WITH YOUR NAUSEA MEDICATION  *UNUSUAL SHORTNESS OF BREATH  *UNUSUAL BRUISING OR BLEEDING  TENDERNESS IN MOUTH AND THROAT WITH OR WITHOUT PRESENCE OF ULCERS  *URINARY PROBLEMS  *BOWEL PROBLEMS  UNUSUAL RASH Items with * indicate a potential emergency and should be followed up as soon as possible.  Feel free to call the clinic should you have any questions or concerns. The clinic phone number is [phone].  Please show the [facility] at check-in to the Emergency Department and triage nurse.

## 2017-10-20 ENCOUNTER — Encounter: Payer: Self-pay | Admitting: Hematology and Oncology

## 2017-10-20 ENCOUNTER — Telehealth: Payer: Self-pay | Admitting: Hematology and Oncology

## 2017-10-20 NOTE — Assessment & Plan Note (Signed)
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

## 2017-10-20 NOTE — Telephone Encounter (Signed)
No 12/17 los °

## 2017-10-20 NOTE — Assessment & Plan Note (Signed)
The patient has pancytopenia but not symptomatic We will continue mild scheduling changes due to the holidays She is not symptomatic from pancytopenia

## 2017-10-20 NOTE — Progress Notes (Signed)
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 This may potentially be ovarian or a GI in etiology.      04/30/2016 Tumor Marker    CA 125- 7149.0 (H)      05/01/2016 Procedure    US paracentesis- Successful ultrasound-guided paracentesis yielding 1.8 liters of peritoneal fluid.      05/01/2016 Imaging    US pelvis- Both transabdominal and transvaginal sonography are significantly limited by large patient habitus and ascites. Neither uterus or ovaries were visualized on this exam.      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      05/08/2016 Imaging    CT chest- No evidence of metastatic disease in the chest. Peritoneal/omental disease with abdominal ascites in the upper abdomen, incompletely visualized.       05/13/2016 Procedure    Placement of single lumen port a cath via right internal jugular vein. The catheter tip lies at the cavoatrial junction. A power injectable port a cath was placed and is ready for immediate use.      05/15/2016 Procedure    US Paracentesis- 3400 ml yellow colored ascites removed      05/15/2016 - 09/18/2016 Chemotherapy    Carboplatin/Paclitaxel every 21 days x 7 cycles      07/01/2016 Miscellaneous    Genetic Counseling by [name]-  Genetic testing was normal, and did not reveal a deleterious mutation in these genes.       07/08/2016 Imaging    CT CAP- 1. Small volume ascites, significantly  decreased. 2. Stable diffuse omental soft tissue caking and diffuse peritoneal thickening along the bilateral paracolic gutters and bilateral pelvic peritoneal reflections, consistent with peritoneal carcinomatosis. 3. Stable asymmetrically enlarged right ovary, which may represent the primary site of ovarian malignancy. 4. No evidence of metastatic disease in the chest. No new sites of metastatic disease in the abdomen or pelvis.      07/09/2016 Miscellaneous    Gyn Onc re-evaluation- modest response to therapy, 3 more cycles of chemotherapy recommended.        09/11/2016 Imaging    CT C/A/P No significant change omental soft tissue caking, consistent with metastatic disease. Mild ascites is decreased since previous study.  Increased calcification along peritoneal surface in pelvic cul-de-sac, consistent with treated peritoneal metastatic disease.  Stable 4.5cm homogeneous right pelvic mass, which favors a uterine fibroid although right ovarian neoplasm cannot definitely be excluded.  No new or progressive metastatic disease identified. No evidence of metastatic disease within the thorax.       10/14/2016 Procedure    Robotic-assisted laparoscopic total hysterectomy with bilateral salpingoophorectomy, ex lap omentectomy, radical tumor debulking by Dr. Denman George      10/17/2016 Pathology Results    Diagnosis 1. 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 - CERVIX AND ENDOMETRIUM FREE OF TUMOR. - SEE ONCOLOGY TABLE AND COMMENT. 2. Soft tissue, biopsy, umbilical nodule - HIGH GRADE SEROUS CARCINOMA. 3.  Omentum, resection for tumor - HIGH GRADE SEROUS CARCINOMA, 33 CM.      11/06/2016 - 12/25/2016 Chemotherapy    Carboplatin/Paclitaxel x 3 cycles       01/12/2017 Imaging    CT CAP- 1. Interval hysterectomy, bilateral salpingo-oophorectomy and omentectomy without evidence of  tumor recurrence. 2. No evidence of metastatic disease. 3. 5 mm nonobstructing lower pole left renal calculus.      01/13/2017 Remission    No evidence of residual disease on CT imaging.      05/04/2017 Imaging    CT CAP- New small amount of ascites within the abdomen and pelvis since 01/12/2017 which could indicate disease progression but no new identifiable tumor and no significant change in omental and mild peritoneal thickening.  No evidence of metastatic disease within the chest.  Coronary artery disease.  Aortic Atherosclerosis (ICD10-I70.0).      05/04/2017 Tumor Marker    Patient's tumor was tested for the following markers: CA 125 Results of the tumor marker test revealed 2952      05/18/2017 Imaging    ECHO; EF 60% -  65      05/25/2017 - 07/07/2017 Chemotherapy    She received Doxil and Avastin. Treatment is stopped due to disease progression      06/01/2017 Procedure    Successful ultrasound-guided therapeutic paracentesis yielding 2.8 liters of peritoneal fluid      06/22/2017 Tumor Marker    Patient's tumor was tested for the following markers: CA 125 Results of the tumor marker test revealed 13440      07/20/2017 Imaging    Mild increase in peritoneal carcinoma within abdomen pelvis since previous study. No significant change and minimal ascites.  No evidence of metastatic disease within the thorax. New mild airspace opacity in left lower lobe, consistent with inflammatory or infectious etiology.      07/30/2017 Tumor Marker    Patient's tumor was tested for the following markers: CA 125 Results of the tumor marker test revealed 12099      07/30/2017 -  Chemotherapy    The patient had gemzar      08/03/2017 - 08/05/2017 Hospital Admission    She was admitted to the hospital for management of UTI and neutropenic fever      08/13/2017 Adverse Reaction    Dose of chemotherapy is reduced due to neutropenic sepsis      08/24/2017 Tumor Marker     Patient's tumor was tested for the following markers: CA 125 Results of the tumor marker test revealed 7431      09/21/2017 Tumor Marker    Patient's tumor was tested for the following markers: CA 125 Results of the tumor marker test revealed 4176      10/09/2017 Imaging    No significant change in peritoneal carcinomatosis since previous study.  No new or progressive metastatic disease within the abdomen or pelvis.  Stable tiny nonobstructive left renal calculus. No evidence of ureteral calculi or hydronephrosis.       INTERVAL HISTORY: Please see below for problem oriented charting. She returns for further follow-up Her recent treatment was delayed due to the weather She felt well Denies nausea, vomiting or changes in bowel habits Denies abdominal pain She is not symptomatic from pancytopenia No recent infection  REVIEW OF SYSTEMS:   Constitutional: Denies fevers, chills or abnormal weight loss Eyes: Denies blurriness of vision Ears, nose, mouth, throat, and face: Denies mucositis or sore throat Respiratory: Denies cough, dyspnea or wheezes Cardiovascular: Denies  palpitation, chest discomfort or lower extremity swelling Gastrointestinal:  Denies nausea, heartburn or change in bowel habits Skin: Denies abnormal skin rashes Lymphatics: Denies new lymphadenopathy or easy bruising Neurological:Denies numbness, tingling or new weaknesses Behavioral/Psych: Mood is stable, no new changes  All other systems were reviewed with the patient and are negative.  I have reviewed the past medical history, past surgical history, social history and family history with the patient and they are unchanged from previous note.  ALLERGIES:  is allergic to avandia [rosiglitazone]; micronase [glyburide]; and actos [pioglitazone].  MEDICATIONS:  Current Outpatient Medications  Medication Sig Dispense Refill  . atorvastatin (LIPITOR) 40 MG tablet TAKE 1 TABLET(40 MG) BY MOUTH DAILY 90 tablet 0   . benazepril (LOTENSIN) 10 MG tablet TAKE 1 TABLET(10 MG) BY MOUTH DAILY 90 tablet 0  . furosemide (LASIX) 20 MG tablet TAKE 1 TABLET(20 MG) BY MOUTH TWICE DAILY 90 tablet 0  . HYDROcodone-acetaminophen (NORCO) 10-325 MG tablet Take 1 tablet by mouth every 4 (four) hours as needed. 90 tablet 0  . lidocaine-prilocaine (EMLA) cream Apply a quarter size amount to port site 1 hour prior to chemo. Do not rub in. Cover with plastic wrap. 30 g 3  . loperamide (IMODIUM A-D) 2 MG tablet Take 1 tablet (2 mg total) by mouth 4 (four) times daily as needed for diarrhea or loose stools. 30 tablet 0  . loperamide (IMODIUM) 2 MG capsule     . metFORMIN (GLUCOPHAGE) 1000 MG tablet TAKE 1 TABLET BY MOUTH TWICE DAILY WITH A MEAL 180 tablet 1  . Multiple Vitamin (MULTIVITAMIN WITH MINERALS) TABS Take 1 tablet by mouth daily.    . multivitamin-lutein (OCUVITE-LUTEIN) CAPS capsule Take 1 capsule by mouth daily.    Marland Kitchen NOVOLOG 100 UNIT/ML injection USE IN INSULIN PUMP AS DIRECTED. MAX DAILY DOSE OF 110 UNITS PER DAY 120 mL 0  . omeprazole (PRILOSEC) 40 MG capsule Take 1 capsule (40 mg total) daily by mouth. 90 capsule 3  . ondansetron (ZOFRAN) 8 MG tablet Take 1 tablet (8 mg total) by mouth every 8 (eight) hours as needed for nausea or vomiting. 90 tablet 3  . ONE TOUCH ULTRA TEST test strip USE TO CHECK BLOOD SUGAR UP TO 5 TIMES A DAY 450 each 2  . polyethylene glycol (MIRALAX / GLYCOLAX) packet Take 17 g by mouth daily as needed for mild constipation.     . senna (SENOKOT) 8.6 MG TABS tablet Take 1 tablet (8.6 mg total) by mouth at bedtime as needed for moderate constipation. 120 each 0  . vitamin B-12 (CYANOCOBALAMIN) 1000 MCG tablet Take 1,000 mcg by mouth daily.    . vitamin E (VITAMIN E) 400 UNIT capsule Take 400 Units by mouth daily.     No current facility-administered medications for this visit.     PHYSICAL EXAMINATION: ECOG PERFORMANCE STATUS: 1 - Symptomatic but completely ambulatory  Vitals:    10/19/17 0844  BP: (!) 136/49  Pulse: 85  Resp: 17  Temp: 98.5 F (36.9 C)  SpO2: 96%   Filed Weights   10/19/17 0844  Weight: 207 lb 8 oz (94.1 kg)    GENERAL:alert, no distress and comfortable.  She is morbidly obese SKIN: skin color, texture, turgor are normal, no rashes or significant lesions EYES: normal, Conjunctiva are pink and non-injected, sclera clear OROPHARYNX:no exudate, no erythema and lips, buccal mucosa, and tongue normal  NECK: supple, thyroid normal size, non-tender, without nodularity LYMPH:  no palpable lymphadenopathy in the cervical, axillary or  inguinal LUNGS: clear to auscultation and percussion with normal breathing effort HEART: regular rate & rhythm and no murmurs and no lower extremity edema ABDOMEN:abdomen soft, non-tender and normal bowel sounds Musculoskeletal:no cyanosis of digits and no clubbing  NEURO: alert & oriented x 3 with fluent speech, no focal motor/sensory deficits  LABORATORY DATA:  I have reviewed the data as listed    Component Value Date/Time   NA 140 10/19/2017 0821   K 4.8 10/19/2017 0821   CL 104 09/30/2017 2048   CO2 23 10/19/2017 0821   GLUCOSE 90 10/19/2017 0821   BUN 27.9 (H) 10/19/2017 0821   CREATININE 1.1 10/19/2017 0821   CALCIUM 9.5 10/19/2017 0821   PROT 7.1 10/19/2017 0821   ALBUMIN 3.8 10/19/2017 0821   AST 18 10/19/2017 0821   ALT 15 10/19/2017 0821   ALKPHOS 93 10/19/2017 0821   BILITOT 0.33 10/19/2017 0821   GFRNONAA 46 (L) 09/30/2017 2048   GFRAA 54 (L) 09/30/2017 2048    No results found for: SPEP, UPEP  Lab Results  Component Value Date   WBC 3.8 (L) 10/19/2017   NEUTROABS 2.3 10/19/2017   HGB 9.8 (L) 10/19/2017   HCT 30.2 (L) 10/19/2017   MCV 105.5 (H) 10/19/2017   PLT 242 10/19/2017      Chemistry      Component Value Date/Time   NA 140 10/19/2017 0821   K 4.8 10/19/2017 0821   CL 104 09/30/2017 2048   CO2 23 10/19/2017 0821   BUN 27.9 (H) 10/19/2017 0821   CREATININE 1.1  10/19/2017 0821      Component Value Date/Time   CALCIUM 9.5 10/19/2017 0821   ALKPHOS 93 10/19/2017 0821   AST 18 10/19/2017 0821   ALT 15 10/19/2017 0821   BILITOT 0.33 10/19/2017 0821       RADIOGRAPHIC STUDIES: I have reviewed imaging study extensively with her and her husband I have personally reviewed the radiological images as listed and agreed with the findings in the report. Dg Chest 2 View  Result Date: 09/30/2017 CLINICAL DATA:  66 y/o F; sudden onset fever. Patient receiving chemotherapy for ovarian cancer. EXAM: CHEST  2 VIEW COMPARISON:  08/04/2017 chest radiograph FINDINGS: Stable heart size and mediastinal contours are within normal limits. Both lungs are clear. Right port catheter tip is stable projecting over mid SVC. Calcifications along greater tuberosity of right humerus compatible with calcific tendinopathy of rotator cuff. IMPRESSION: No acute pulmonary process identified. Electronically Signed   By: Kristine Garbe M.D.   On: 09/30/2017 21:17   Ct Abdomen Pelvis W Contrast  Result Date: 10/09/2017 CLINICAL DATA:  Followup ovarian/peritoneal carcinoma. Ongoing chemotherapy. Rising CA 125 levels. EXAM: CT ABDOMEN AND PELVIS WITH CONTRAST TECHNIQUE: Multidetector CT imaging of the abdomen and pelvis was performed using the standard protocol following bolus administration of intravenous contrast. CONTRAST:  120mL ISOVUE-300 IOPAMIDOL (ISOVUE-300) INJECTION 61% COMPARISON:  07/20/2017 FINDINGS: Lower Chest: No acute findings. Hepatobiliary: No hepatic masses identified. Gallbladder is unremarkable. Pancreas:  No mass or inflammatory changes. Spleen: Within normal limits in size and appearance. Adrenals/Urinary Tract: No masses identified. Stable small approximately 5 mm nonobstructing calculus in lower pole of left kidney. No evidence of hydronephrosis. Stomach/Bowel: No evidence of obstruction, inflammatory process or abnormal fluid collections. Vascular/Lymphatic:  No pathologically enlarged lymph nodes. No abdominal aortic aneurysm. Aortic atherosclerosis. Reproductive: Prior hysterectomy noted. Adnexal regions are unremarkable in appearance. Peritoneal thickening in the abdomen and pelvis and soft tissue caking in the omentum show no significant  change, consistent with peritoneal carcinomatosis. No evidence of ascites. Other:  None. Musculoskeletal:  No suspicious bone lesions identified. IMPRESSION: No significant change in peritoneal carcinomatosis since previous study. No new or progressive metastatic disease within the abdomen or pelvis. Stable tiny nonobstructive left renal calculus. No evidence of ureteral calculi or hydronephrosis. Electronically Signed   By: Earle Gell M.D.   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[place]) She has responded well to gemcitabine but CT scan showed no significant changes We discussed the risk and benefit of addition of cisplatin for deeper response versus continue gemcitabine as scheduled The risks, benefits, side effects of cisplatin is fully discussed with the patient and she is undecided We will proceed with gemcitabine for now  Chemotherapy-induced neuropathy ([place]) She had history of peripheral neuropathy due to chemotherapy and diabetes Continue to monitor closely  Pancytopenia, acquired [facility]) The patient has pancytopenia but not symptomatic We will continue mild scheduling changes due to the holidays She is not symptomatic from pancytopenia   No orders of the defined types were placed in this encounter.  All questions were answered. The patient knows to call the clinic with any problems, questions or concerns. No barriers to learning was detected. I spent 20 minutes counseling the patient face to face. The total time spent in the appointment was 30 minutes and more than 50% was on counseling and review of test results     Heath Lark, MD 10/20/2017 4:58 PM

## 2017-10-20 NOTE — Assessment & Plan Note (Signed)
She has responded well to gemcitabine but CT scan showed no significant changes We discussed the risk and benefit of addition of cisplatin for deeper response versus continue gemcitabine as scheduled The risks, benefits, side effects of cisplatin is fully discussed with the patient and she is undecided We will proceed with gemcitabine for now

## 2017-10-21 ENCOUNTER — Encounter: Payer: Self-pay | Admitting: Hematology and Oncology

## 2017-10-21 ENCOUNTER — Other Ambulatory Visit: Payer: Self-pay | Admitting: Hematology and Oncology

## 2017-10-21 NOTE — Progress Notes (Signed)
The patient has made ultimate decision to add cisplatin along with gemcitabine for deeper response She is made aware of the risks, benefits, side effects of additional treatment with cisplatin along with gemcitabine I would reduce the dose of gemcitabine to 600 mg/m and split dose cisplatin to 25 mg/m square I will see her on day 8 of treatment She would need G-CSF support with this regimen

## 2017-10-21 NOTE — Progress Notes (Signed)
DISCONTINUE OFF PATHWAY REGIMEN - Ovarian   [id number]:Gemcitabine 1,000 mg/m2 Days 1, 8, 15 q28 Days:   A cycle is every 28 days (3 weeks on and 1 week off):     Gemcitabine   **Always confirm dose/schedule in your pharmacy ordering system**    REASON: Continuation Of Treatment PRIOR TREATMENT: Off Pathway: Gemcitabine 1,000 mg/m2 Days 1, 8, 15 q28 Days TREATMENT RESPONSE: Partial Response (PR)  START OFF PATHWAY REGIMEN - Ovarian   [id number]:Cisplatin 25 mg/m2 D1,8 + Gemcitabine 1,000 mg/m2 D1,8 q21 Days:   A cycle is every 21 days:     Gemcitabine      Cisplatin   **Always confirm dose/schedule in your pharmacy ordering system**    Patient Characteristics: Recurrent or Progressive Disease, Third Line Therapy, Platinum Sensitive and >  6 Months Since Last Platinum Therapy, BRCA Mutation Absent/Unknown AJCC T Category: T3 AJCC N Category: N0 AJCC M Category: M1 Therapeutic Status: Recurrent or Progressive Disease AJCC 8 Stage Grouping: IV Line of Therapy: Third Line BRCA Mutation Status: Absent Would you be surprised if this patient died  in the next year<= I would be surprised if this patient died in the next year Intent of Therapy: Non-Curative / Palliative Intent, Discussed with Patient

## 2017-10-23 ENCOUNTER — Other Ambulatory Visit: Payer: Self-pay | Admitting: Family Medicine

## 2017-10-23 ENCOUNTER — Telehealth: Payer: Self-pay | Admitting: Hematology and Oncology

## 2017-10-23 NOTE — Telephone Encounter (Signed)
Scheduled appt per 12/19 sch message - patient is  Aware of appt date and time.

## 2017-10-28 ENCOUNTER — Other Ambulatory Visit: Payer: Self-pay | Admitting: Internal Medicine

## 2017-10-28 ENCOUNTER — Telehealth: Payer: Self-pay | Admitting: Pediatrics

## 2017-10-28 ENCOUNTER — Telehealth: Payer: Self-pay

## 2017-10-28 DIAGNOSIS — I10 Essential (primary) hypertension: Secondary | ICD-10-CM

## 2017-10-28 DIAGNOSIS — E785 Hyperlipidemia, unspecified: Secondary | ICD-10-CM

## 2017-10-28 NOTE — Telephone Encounter (Signed)
Patient called and stated Dr. Cruzita Lederer changed her prescription was changed for Novolog so she needs accurate dosage- I saw Novolog in chart but does not specify how much daily- patient also asking for Dr. Cruzita Lederer to fill metformin, benazepril, atorvastatin which she has been getting filled by another doctor- please advise

## 2017-10-28 NOTE — Telephone Encounter (Signed)
Last seen 03/25/17  Dr Livia Snellen

## 2017-10-28 NOTE — Telephone Encounter (Signed)
Patient aware.

## 2017-10-28 NOTE — Telephone Encounter (Signed)
What is the name of the medication? Benazepril 10 mg, Atorvastatin 40 mg. Patient wants #90. Last refill called in was only #30.  Have you contacted your pharmacy to request a refill? YES  Which pharmacy would you like this sent to? [facility] in [place]   Patient notified that their request is being sent to the clinical staff for review and that they should receive a call once it is complete. If they do not receive a call within 24 hours they can check with their pharmacy or our office.

## 2017-10-28 NOTE — Telephone Encounter (Signed)
The test strips for one touch ultra need to be called into [facility]. All the meds need to be called into [facility].   Novolog does not need to be called in.

## 2017-10-28 NOTE — Telephone Encounter (Signed)
Yes. Please ask her to set up follow up.

## 2017-10-28 NOTE — Telephone Encounter (Signed)
I think you only did 30 because not seen since May  Dr Vincent's patient

## 2017-10-29 NOTE — Telephone Encounter (Signed)
Please advise on Novolog dosages and if you will be filling those medications pt asked about below.

## 2017-10-29 NOTE — Telephone Encounter (Signed)
Pending telephone call response

## 2017-10-29 NOTE — Telephone Encounter (Signed)
OK to fill Novolog: in my note, the total daily dose, TDD was:  TDD 76.4 units -  We can send this as up to 90 units in insulin pump per day  OK to refill the rest of the meds, also.

## 2017-11-02 ENCOUNTER — Other Ambulatory Visit: Payer: Self-pay

## 2017-11-02 ENCOUNTER — Ambulatory Visit (HOSPITAL_BASED_OUTPATIENT_CLINIC_OR_DEPARTMENT_OTHER): Payer: BC Managed Care – PPO

## 2017-11-02 ENCOUNTER — Ambulatory Visit: Payer: BC Managed Care – PPO

## 2017-11-02 ENCOUNTER — Other Ambulatory Visit (HOSPITAL_BASED_OUTPATIENT_CLINIC_OR_DEPARTMENT_OTHER): Payer: BC Managed Care – PPO

## 2017-11-02 VITALS — BP 128/72 | HR 76 | Temp 98.4°F | Resp 18

## 2017-11-02 DIAGNOSIS — C786 Secondary malignant neoplasm of retroperitoneum and peritoneum: Secondary | ICD-10-CM | POA: Diagnosis not present

## 2017-11-02 DIAGNOSIS — C481 Malignant neoplasm of specified parts of peritoneum: Secondary | ICD-10-CM

## 2017-11-02 DIAGNOSIS — Z5111 Encounter for antineoplastic chemotherapy: Secondary | ICD-10-CM | POA: Diagnosis not present

## 2017-11-02 DIAGNOSIS — C801 Malignant (primary) neoplasm, unspecified: Secondary | ICD-10-CM | POA: Diagnosis not present

## 2017-11-02 DIAGNOSIS — T451X5A Adverse effect of antineoplastic and immunosuppressive drugs, initial encounter: Secondary | ICD-10-CM

## 2017-11-02 DIAGNOSIS — D6481 Anemia due to antineoplastic chemotherapy: Secondary | ICD-10-CM

## 2017-11-02 DIAGNOSIS — E538 Deficiency of other specified B group vitamins: Secondary | ICD-10-CM

## 2017-11-02 LAB — CBC WITH DIFFERENTIAL/PLATELET
BASO%: 0.2 % (ref 0.0–2.0)
BASOS ABS: 0 10*3/uL (ref 0.0–0.1)
EOS ABS: 0.7 10*3/uL — AB (ref 0.0–0.5)
EOS%: 11.8 % — AB (ref 0.0–7.0)
HCT: 30.6 % — ABNORMAL LOW (ref 34.8–46.6)
HGB: 9.6 g/dL — ABNORMAL LOW (ref 11.6–15.9)
LYMPH%: 6.1 % — AB (ref 14.0–49.7)
MCH: 34 pg (ref 25.1–34.0)
MCHC: 31.4 g/dL — AB (ref 31.5–36.0)
MCV: 108.5 fL — AB (ref 79.5–101.0)
MONO#: 0.5 10*3/uL (ref 0.1–0.9)
MONO%: 8.7 % (ref 0.0–14.0)
NEUT#: 4.5 10*3/uL (ref 1.5–6.5)
NEUT%: 73.2 % (ref 38.4–76.8)
Platelets: 150 10*3/uL (ref 145–400)
RBC: 2.82 10*6/uL — AB (ref 3.70–5.45)
RDW: 17.8 % — AB (ref 11.2–14.5)
WBC: 6.1 10*3/uL (ref 3.9–10.3)
lymph#: 0.4 10*3/uL — ABNORMAL LOW (ref 0.9–3.3)

## 2017-11-02 LAB — COMPREHENSIVE METABOLIC PANEL
ALBUMIN: 3.5 g/dL (ref 3.5–5.0)
ALK PHOS: 108 U/L (ref 40–150)
ALT: 23 U/L (ref 0–55)
AST: 18 U/L (ref 5–34)
Anion Gap: 9 mEq/L (ref 3–11)
BUN: 23.7 mg/dL (ref 7.0–26.0)
CHLORIDE: 104 meq/L (ref 98–109)
CO2: 26 mEq/L (ref 22–29)
Calcium: 9.4 mg/dL (ref 8.4–10.4)
Creatinine: 0.9 mg/dL (ref 0.6–1.1)
GLUCOSE: 140 mg/dL (ref 70–140)
POTASSIUM: 4.7 meq/L (ref 3.5–5.1)
SODIUM: 139 meq/L (ref 136–145)
Total Bilirubin: 0.24 mg/dL (ref 0.20–1.20)
Total Protein: 7 g/dL (ref 6.4–8.3)

## 2017-11-02 MED ORDER — POTASSIUM CHLORIDE 2 MEQ/ML IV SOLN
Freq: Once | INTRAVENOUS | Status: AC
  Administered 2017-11-02: 10:00:00 via INTRAVENOUS
  Filled 2017-11-02: qty 10

## 2017-11-02 MED ORDER — SODIUM CHLORIDE 0.9 % IV SOLN
Freq: Once | INTRAVENOUS | Status: AC
  Administered 2017-11-02: 12:00:00 via INTRAVENOUS
  Filled 2017-11-02: qty 5

## 2017-11-02 MED ORDER — SODIUM CHLORIDE 0.9% FLUSH
10.0000 mL | INTRAVENOUS | Status: DC | PRN
  Administered 2017-11-02: 10 mL
  Filled 2017-11-02: qty 10

## 2017-11-02 MED ORDER — SODIUM CHLORIDE 0.9 % IV SOLN
24.5000 mg/m2 | Freq: Once | INTRAVENOUS | Status: AC
  Administered 2017-11-02: 50 mg via INTRAVENOUS
  Filled 2017-11-02: qty 50

## 2017-11-02 MED ORDER — PALONOSETRON HCL INJECTION 0.25 MG/5ML
0.2500 mg | Freq: Once | INTRAVENOUS | Status: AC
  Administered 2017-11-02: 0.25 mg via INTRAVENOUS

## 2017-11-02 MED ORDER — HEPARIN SOD (PORK) LOCK FLUSH 100 UNIT/ML IV SOLN
500.0000 [IU] | Freq: Once | INTRAVENOUS | Status: AC | PRN
  Administered 2017-11-02: 500 [IU]
  Filled 2017-11-02: qty 5

## 2017-11-02 MED ORDER — SODIUM CHLORIDE 0.9% FLUSH
10.0000 mL | Freq: Once | INTRAVENOUS | Status: AC
  Administered 2017-11-02: 10 mL
  Filled 2017-11-02: qty 10

## 2017-11-02 MED ORDER — PALONOSETRON HCL INJECTION 0.25 MG/5ML
INTRAVENOUS | Status: AC
  Filled 2017-11-02: qty 5

## 2017-11-02 MED ORDER — SODIUM CHLORIDE 0.9 % IV SOLN
Freq: Once | INTRAVENOUS | Status: AC
  Administered 2017-11-02: 10:00:00 via INTRAVENOUS

## 2017-11-02 MED ORDER — GEMCITABINE HCL CHEMO INJECTION 1 GM/26.3ML
600.0000 mg/m2 | Freq: Once | INTRAVENOUS | Status: AC
  Administered 2017-11-02: 1216 mg via INTRAVENOUS
  Filled 2017-11-02: qty 31.98

## 2017-11-02 MED ORDER — PROCHLORPERAZINE MALEATE 10 MG PO TABS: 10.0000 mg | ORAL_TABLET | Freq: Four times a day (QID) | ORAL | 0 refills | Status: DC | PRN

## 2017-11-02 NOTE — Patient Instructions (Signed)
[facility] Discharge Instructions for Patients Receiving Chemotherapy  Today you received the following chemotherapy agents :  Gemcitabline. And Cisplatin To help prevent nausea and vomiting after your treatment, we encourage you to take your nausea medication as prescribed.   If you develop nausea and vomiting that is not controlled by your nausea medication, call the clinic.   BELOW ARE SYMPTOMS THAT SHOULD BE REPORTED IMMEDIATELY:  *FEVER GREATER THAN 100.5 F  *CHILLS WITH OR WITHOUT FEVER  NAUSEA AND VOMITING THAT IS NOT CONTROLLED WITH YOUR NAUSEA MEDICATION  *UNUSUAL SHORTNESS OF BREATH  *UNUSUAL BRUISING OR BLEEDING  TENDERNESS IN MOUTH AND THROAT WITH OR WITHOUT PRESENCE OF ULCERS  *URINARY PROBLEMS  *BOWEL PROBLEMS  UNUSUAL RASH Items with * indicate a potential emergency and should be followed up as soon as possible.  Feel free to call the clinic should you have any questions or concerns. The clinic phone number is [phone].  Please show the [facility] at check-in to the Emergency Department and triage nurse.  Cisplatin injection What is this medicine? CISPLATIN (SIS pla tin) is a chemotherapy drug. It targets fast dividing cells, like cancer cells, and causes these cells to die. This medicine is used to treat many types of cancer like bladder, ovarian, and testicular cancers. This medicine may be used for other purposes; ask your health care provider or pharmacist if you have questions. COMMON BRAND NAME(S): Platinol, Platinol -AQ What should I tell my health care provider before I take this medicine? They need to know if you have any of these conditions: -blood disorders -hearing problems -kidney disease -recent or ongoing radiation therapy -an unusual or allergic reaction to cisplatin, carboplatin, other chemotherapy, other medicines, foods, dyes, or preservatives -pregnant or trying to get pregnant -breast-feeding How should I  use this medicine? This drug is given as an infusion into a vein. It is administered in a hospital or clinic by a specially trained health care professional. Talk to your pediatrician regarding the use of this medicine in children. Special care may be needed. Overdosage: If you think you have taken too much of this medicine contact a poison control center or emergency room at once. NOTE: This medicine is only for you. Do not share this medicine with others. What if I miss a dose? It is important not to miss a dose. Call your doctor or health care professional if you are unable to keep an appointment. What may interact with this medicine? -dofetilide -foscarnet -medicines for seizures -medicines to increase blood counts like filgrastim, pegfilgrastim, sargramostim -probenecid -pyridoxine used with altretamine -rituximab -some antibiotics like amikacin, gentamicin, neomycin, polymyxin B, streptomycin, tobramycin -sulfinpyrazone -vaccines -zalcitabine Talk to your doctor or health care professional before taking any of these medicines: -acetaminophen -aspirin -ibuprofen -ketoprofen -naproxen This list may not describe all possible interactions. Give your health care provider a list of all the medicines, herbs, non-prescription drugs, or dietary supplements you use. Also tell them if you smoke, drink alcohol, or use illegal drugs. Some items may interact with your medicine. What should I watch for while using this medicine? Your condition will be monitored carefully while you are receiving this medicine. You will need important blood work done while you are taking this medicine. This drug may make you feel generally unwell. This is not uncommon, as chemotherapy can affect healthy cells as well as cancer cells. Report any side effects. Continue your course of treatment even though you feel ill unless your doctor tells  you to stop. In some cases, you may be given additional medicines to help  with side effects. Follow all directions for their use. Call your doctor or health care professional for advice if you get a fever, chills or sore throat, or other symptoms of a cold or flu. Do not treat yourself. This drug decreases your body's ability to fight infections. Try to avoid being around people who are sick. This medicine may increase your risk to bruise or bleed. Call your doctor or health care professional if you notice any unusual bleeding. Be careful brushing and flossing your teeth or using a toothpick because you may get an infection or bleed more easily. If you have any dental work done, tell your dentist you are receiving this medicine. Avoid taking products that contain aspirin, acetaminophen, ibuprofen, naproxen, or ketoprofen unless instructed by your doctor. These medicines may hide a fever. Do not become pregnant while taking this medicine. Women should inform their doctor if they wish to become pregnant or think they might be pregnant. There is a potential for serious side effects to an unborn child. Talk to your health care professional or pharmacist for more information. Do not breast-feed an infant while taking this medicine. Drink fluids as directed while you are taking this medicine. This will help protect your kidneys. Call your doctor or health care professional if you get diarrhea. Do not treat yourself. What side effects may I notice from receiving this medicine? Side effects that you should report to your doctor or health care professional as soon as possible: -allergic reactions like skin rash, itching or hives, swelling of the face, lips, or tongue -signs of infection - fever or chills, cough, sore throat, pain or difficulty passing urine -signs of decreased platelets or bleeding - bruising, pinpoint red spots on the skin, black, tarry stools, nosebleeds -signs of decreased red blood cells - unusually weak or tired, fainting spells, lightheadedness -breathing  problems -changes in hearing -gout pain -low blood counts - This drug may decrease the number of white blood cells, red blood cells and platelets. You may be at increased risk for infections and bleeding. -nausea and vomiting -pain, swelling, redness or irritation at the injection site -pain, tingling, numbness in the hands or feet -problems with balance, movement -trouble passing urine or change in the amount of urine Side effects that usually do not require medical attention (report to your doctor or health care professional if they continue or are bothersome): -changes in vision -loss of appetite -metallic taste in the mouth or changes in taste This list may not describe all possible side effects. Call your doctor for medical advice about side effects. You may report side effects to FDA at 1-800-FDA-1088. Where should I keep my medicine? This drug is given in a hospital or clinic and will not be stored at home. NOTE: This sheet is a summary. It may not cover all possible information. If you have questions about this medicine, talk to your doctor, pharmacist, or health care provider.  2018 [employer]vier/Gold Standard (2008-01-25 14:40:54)

## 2017-11-04 ENCOUNTER — Telehealth: Payer: Self-pay | Admitting: *Deleted

## 2017-11-04 NOTE — Telephone Encounter (Signed)
Left message on cell phone for chemo follow up

## 2017-11-04 NOTE — Telephone Encounter (Signed)
Pt returned call. States she is doing very well post chemo. Eating and drinking without problems. Bowels moving. Will call if has any questions or concerns

## 2017-11-04 NOTE — Telephone Encounter (Signed)
-----   Message from [place], RN sent at 11/02/2017 10:19 AM EST ----- Regarding: Dr.Gorsuch. First time Chemo Cisplatin. Dr.Gorsuch. First time chemo follow up call. First time Cisplatin

## 2017-11-05 ENCOUNTER — Other Ambulatory Visit: Payer: Self-pay | Admitting: Hematology and Oncology

## 2017-11-05 DIAGNOSIS — C481 Malignant neoplasm of specified parts of peritoneum: Secondary | ICD-10-CM

## 2017-11-05 LAB — CA 125

## 2017-11-10 ENCOUNTER — Telehealth: Payer: Self-pay | Admitting: Hematology and Oncology

## 2017-11-10 ENCOUNTER — Inpatient Hospital Stay (HOSPITAL_BASED_OUTPATIENT_CLINIC_OR_DEPARTMENT_OTHER): Payer: BC Managed Care – PPO | Admitting: Hematology and Oncology

## 2017-11-10 ENCOUNTER — Other Ambulatory Visit: Payer: Self-pay | Admitting: Hematology and Oncology

## 2017-11-10 ENCOUNTER — Inpatient Hospital Stay: Payer: BC Managed Care – PPO

## 2017-11-10 ENCOUNTER — Inpatient Hospital Stay: Payer: BC Managed Care – PPO | Attending: Hematology and Oncology

## 2017-11-10 VITALS — BP 138/52 | HR 78 | Resp 16

## 2017-11-10 DIAGNOSIS — C786 Secondary malignant neoplasm of retroperitoneum and peritoneum: Secondary | ICD-10-CM | POA: Insufficient documentation

## 2017-11-10 DIAGNOSIS — R5383 Other fatigue: Secondary | ICD-10-CM | POA: Insufficient documentation

## 2017-11-10 DIAGNOSIS — Z794 Long term (current) use of insulin: Secondary | ICD-10-CM | POA: Diagnosis not present

## 2017-11-10 DIAGNOSIS — G620 Drug-induced polyneuropathy: Secondary | ICD-10-CM | POA: Diagnosis not present

## 2017-11-10 DIAGNOSIS — T451X5S Adverse effect of antineoplastic and immunosuppressive drugs, sequela: Secondary | ICD-10-CM

## 2017-11-10 DIAGNOSIS — D61818 Other pancytopenia: Secondary | ICD-10-CM

## 2017-11-10 DIAGNOSIS — Z5111 Encounter for antineoplastic chemotherapy: Secondary | ICD-10-CM | POA: Insufficient documentation

## 2017-11-10 DIAGNOSIS — N179 Acute kidney failure, unspecified: Secondary | ICD-10-CM | POA: Diagnosis not present

## 2017-11-10 DIAGNOSIS — E119 Type 2 diabetes mellitus without complications: Secondary | ICD-10-CM | POA: Diagnosis not present

## 2017-11-10 DIAGNOSIS — C569 Malignant neoplasm of unspecified ovary: Secondary | ICD-10-CM | POA: Diagnosis not present

## 2017-11-10 DIAGNOSIS — K5900 Constipation, unspecified: Secondary | ICD-10-CM | POA: Diagnosis not present

## 2017-11-10 DIAGNOSIS — I700 Atherosclerosis of aorta: Secondary | ICD-10-CM

## 2017-11-10 DIAGNOSIS — C548 Malignant neoplasm of overlapping sites of corpus uteri: Secondary | ICD-10-CM | POA: Insufficient documentation

## 2017-11-10 DIAGNOSIS — Z79899 Other long term (current) drug therapy: Secondary | ICD-10-CM

## 2017-11-10 DIAGNOSIS — C481 Malignant neoplasm of specified parts of peritoneum: Secondary | ICD-10-CM

## 2017-11-10 DIAGNOSIS — I2510 Atherosclerotic heart disease of native coronary artery without angina pectoris: Secondary | ICD-10-CM | POA: Diagnosis not present

## 2017-11-10 DIAGNOSIS — T451X5A Adverse effect of antineoplastic and immunosuppressive drugs, initial encounter: Secondary | ICD-10-CM

## 2017-11-10 DIAGNOSIS — E538 Deficiency of other specified B group vitamins: Secondary | ICD-10-CM

## 2017-11-10 DIAGNOSIS — I10 Essential (primary) hypertension: Secondary | ICD-10-CM

## 2017-11-10 LAB — CBC WITH DIFFERENTIAL/PLATELET
Abs Granulocyte: 2 10*3/uL (ref 1.5–6.5)
Basophils Absolute: 0 10*3/uL (ref 0.0–0.1)
Basophils Relative: 1 %
EOS ABS: 0.2 10*3/uL (ref 0.0–0.5)
EOS PCT: 7 %
HCT: 26.4 % — ABNORMAL LOW (ref 34.8–46.6)
HEMOGLOBIN: 8.7 g/dL — AB (ref 11.6–15.9)
LYMPHS ABS: 0.2 10*3/uL — AB (ref 0.9–3.3)
Lymphocytes Relative: 9 %
MCH: 34.7 pg — AB (ref 25.1–34.0)
MCHC: 32.9 g/dL (ref 31.5–36.0)
MCV: 105.5 fL — ABNORMAL HIGH (ref 79.5–101.0)
MONO ABS: 0.2 10*3/uL (ref 0.1–0.9)
MONOS PCT: 9 %
NEUTROS ABS: 2 10*3/uL (ref 1.5–6.5)
Neutrophils Relative %: 74 %
Platelets: 135 10*3/uL — ABNORMAL LOW (ref 145–400)
RBC: 2.51 MIL/uL — ABNORMAL LOW (ref 3.70–5.45)
RDW: 19.3 % — AB (ref 11.2–16.1)
WBC: 2.6 10*3/uL — ABNORMAL LOW (ref 3.9–10.3)

## 2017-11-10 LAB — COMPREHENSIVE METABOLIC PANEL
ALK PHOS: 87 U/L (ref 40–150)
ALT: 24 U/L (ref 0–55)
ANION GAP: 9 (ref 3–11)
AST: 15 U/L (ref 5–34)
Albumin: 3.5 g/dL (ref 3.5–5.0)
BUN: 38 mg/dL — ABNORMAL HIGH (ref 7–26)
CALCIUM: 9.2 mg/dL (ref 8.4–10.4)
CO2: 25 mmol/L (ref 22–29)
Chloride: 102 mmol/L (ref 98–109)
Creatinine, Ser: 1.38 mg/dL — ABNORMAL HIGH (ref 0.60–1.10)
GFR calc non Af Amer: 39 mL/min — ABNORMAL LOW (ref >60)
GFR, EST AFRICAN AMERICAN: 46 mL/min — AB (ref >60)
Glucose, Bld: 205 mg/dL — ABNORMAL HIGH (ref 70–140)
POTASSIUM: 5 mmol/L — AB (ref 3.3–4.7)
SODIUM: 136 mmol/L (ref 136–145)
Total Bilirubin: 0.3 mg/dL (ref 0.2–1.2)
Total Protein: 6.7 g/dL (ref 6.4–8.3)

## 2017-11-10 MED ORDER — PEGFILGRASTIM 6 MG/0.6ML ~~LOC~~ PSKT
PREFILLED_SYRINGE | SUBCUTANEOUS | Status: AC
  Filled 2017-11-10: qty 0.6

## 2017-11-10 MED ORDER — SODIUM CHLORIDE 0.9% FLUSH
10.0000 mL | INTRAVENOUS | Status: DC | PRN
  Administered 2017-11-10: 10 mL
  Filled 2017-11-10: qty 10

## 2017-11-10 MED ORDER — SODIUM CHLORIDE 0.9% FLUSH
10.0000 mL | Freq: Once | INTRAVENOUS | Status: AC
  Administered 2017-11-10: 10 mL
  Filled 2017-11-10: qty 10

## 2017-11-10 MED ORDER — MAGNESIUM SULFATE 50 % IJ SOLN
Freq: Once | INTRAVENOUS | Status: AC
  Administered 2017-11-10: 10:00:00 via INTRAVENOUS
  Filled 2017-11-10: qty 2.96

## 2017-11-10 MED ORDER — SODIUM CHLORIDE 0.9 % IV SOLN
600.0000 mg/m2 | Freq: Once | INTRAVENOUS | Status: AC
  Administered 2017-11-10: 1216 mg via INTRAVENOUS
  Filled 2017-11-10: qty 31.98

## 2017-11-10 MED ORDER — HYDROCODONE-ACETAMINOPHEN 10-325 MG PO TABS: 1.0000 | ORAL_TABLET | ORAL | 0 refills | Status: DC | PRN

## 2017-11-10 MED ORDER — PALONOSETRON HCL INJECTION 0.25 MG/5ML
0.2500 mg | Freq: Once | INTRAVENOUS | Status: AC
  Administered 2017-11-10: 0.25 mg via INTRAVENOUS

## 2017-11-10 MED ORDER — SODIUM CHLORIDE 0.9 % IV SOLN
24.5000 mg/m2 | Freq: Once | INTRAVENOUS | Status: AC
  Administered 2017-11-10: 50 mg via INTRAVENOUS
  Filled 2017-11-10: qty 50

## 2017-11-10 MED ORDER — GABAPENTIN 300 MG PO CAPS: 300.0000 mg | ORAL_CAPSULE | Freq: Three times a day (TID) | ORAL | 11 refills | Status: DC

## 2017-11-10 MED ORDER — PALONOSETRON HCL INJECTION 0.25 MG/5ML
INTRAVENOUS | Status: AC
  Filled 2017-11-10: qty 5

## 2017-11-10 MED ORDER — SODIUM CHLORIDE 0.9 % IV SOLN
Freq: Once | INTRAVENOUS | Status: AC
  Administered 2017-11-10: 11:00:00 via INTRAVENOUS

## 2017-11-10 MED ORDER — HEPARIN SOD (PORK) LOCK FLUSH 100 UNIT/ML IV SOLN
500.0000 [IU] | Freq: Once | INTRAVENOUS | Status: AC | PRN
  Administered 2017-11-10: 500 [IU]
  Filled 2017-11-10: qty 5

## 2017-11-10 MED ORDER — FOSAPREPITANT DIMEGLUMINE INJECTION 150 MG
Freq: Once | INTRAVENOUS | Status: AC
  Administered 2017-11-10: 11:00:00 via INTRAVENOUS
  Filled 2017-11-10: qty 5

## 2017-11-10 MED ORDER — PEGFILGRASTIM 6 MG/0.6ML ~~LOC~~ PSKT
6.0000 mg | PREFILLED_SYRINGE | Freq: Once | SUBCUTANEOUS | Status: AC
  Administered 2017-11-10: 6 mg via SUBCUTANEOUS

## 2017-11-10 NOTE — Progress Notes (Signed)
Per MD Alvy Bimler ok to treat with potassium 5.0.  Verbal order for pt to increase oral hydration as well as stop Lotensin for three days then to resume at half the original dose until the pt sees Md Alvy Bimler again.  Pt verbalized understanding.  Per MD Alvy Bimler insurance denied OnPro so pt will come back Thursday for Neulasta shot.  Pt verbalized udnerstanding.

## 2017-11-10 NOTE — Telephone Encounter (Signed)
Patient declined AVS and calendar of upcoming January and February appointments.

## 2017-11-10 NOTE — Patient Instructions (Signed)
[facility] Discharge Instructions for Patients Receiving Chemotherapy  Today you received the following chemotherapy agents:  Gemzar & Cisplatin To help prevent nausea and vomiting after your treatment, we encourage you to take your nausea medication as prescribed.   If you develop nausea and vomiting that is not controlled by your nausea medication, call the clinic.   BELOW ARE SYMPTOMS THAT SHOULD BE REPORTED IMMEDIATELY:  *FEVER GREATER THAN 100.5 F  *CHILLS WITH OR WITHOUT FEVER  NAUSEA AND VOMITING THAT IS NOT CONTROLLED WITH YOUR NAUSEA MEDICATION  *UNUSUAL SHORTNESS OF BREATH  *UNUSUAL BRUISING OR BLEEDING  TENDERNESS IN MOUTH AND THROAT WITH OR WITHOUT PRESENCE OF ULCERS  *URINARY PROBLEMS  *BOWEL PROBLEMS  UNUSUAL RASH Items with * indicate a potential emergency and should be followed up as soon as possible.  Feel free to call the clinic should you have any questions or concerns. The clinic phone number is [phone].  Please show the [facility] at check-in to the Emergency Department and triage nurse.  Cisplatin injection What is this medicine? CISPLATIN (SIS pla tin) is a chemotherapy drug. It targets fast dividing cells, like cancer cells, and causes these cells to die. This medicine is used to treat many types of cancer like bladder, ovarian, and testicular cancers. This medicine may be used for other purposes; ask your health care provider or pharmacist if you have questions. COMMON BRAND NAME(S): Platinol, Platinol -AQ What should I tell my health care provider before I take this medicine? They need to know if you have any of these conditions: -blood disorders -hearing problems -kidney disease -recent or ongoing radiation therapy -an unusual or allergic reaction to cisplatin, carboplatin, other chemotherapy, other medicines, foods, dyes, or preservatives -pregnant or trying to get pregnant -breast-feeding How should I use this  medicine? This drug is given as an infusion into a vein. It is administered in a hospital or clinic by a specially trained health care professional. Talk to your pediatrician regarding the use of this medicine in children. Special care may be needed. Overdosage: If you think you have taken too much of this medicine contact a poison control center or emergency room at once. NOTE: This medicine is only for you. Do not share this medicine with others. What if I miss a dose? It is important not to miss a dose. Call your doctor or health care professional if you are unable to keep an appointment. What may interact with this medicine? -dofetilide -foscarnet -medicines for seizures -medicines to increase blood counts like filgrastim, pegfilgrastim, sargramostim -probenecid -pyridoxine used with altretamine -rituximab -some antibiotics like amikacin, gentamicin, neomycin, polymyxin B, streptomycin, tobramycin -sulfinpyrazone -vaccines -zalcitabine Talk to your doctor or health care professional before taking any of these medicines: -acetaminophen -aspirin -ibuprofen -ketoprofen -naproxen This list may not describe all possible interactions. Give your health care provider a list of all the medicines, herbs, non-prescription drugs, or dietary supplements you use. Also tell them if you smoke, drink alcohol, or use illegal drugs. Some items may interact with your medicine. What should I watch for while using this medicine? Your condition will be monitored carefully while you are receiving this medicine. You will need important blood work done while you are taking this medicine. This drug may make you feel generally unwell. This is not uncommon, as chemotherapy can affect healthy cells as well as cancer cells. Report any side effects. Continue your course of treatment even though you feel ill unless your doctor tells you  to stop. In some cases, you may be given additional medicines to help with side  effects. Follow all directions for their use. Call your doctor or health care professional for advice if you get a fever, chills or sore throat, or other symptoms of a cold or flu. Do not treat yourself. This drug decreases your body's ability to fight infections. Try to avoid being around people who are sick. This medicine may increase your risk to bruise or bleed. Call your doctor or health care professional if you notice any unusual bleeding. Be careful brushing and flossing your teeth or using a toothpick because you may get an infection or bleed more easily. If you have any dental work done, tell your dentist you are receiving this medicine. Avoid taking products that contain aspirin, acetaminophen, ibuprofen, naproxen, or ketoprofen unless instructed by your doctor. These medicines may hide a fever. Do not become pregnant while taking this medicine. Women should inform their doctor if they wish to become pregnant or think they might be pregnant. There is a potential for serious side effects to an unborn child. Talk to your health care professional or pharmacist for more information. Do not breast-feed an infant while taking this medicine. Drink fluids as directed while you are taking this medicine. This will help protect your kidneys. Call your doctor or health care professional if you get diarrhea. Do not treat yourself. What side effects may I notice from receiving this medicine? Side effects that you should report to your doctor or health care professional as soon as possible: -allergic reactions like skin rash, itching or hives, swelling of the face, lips, or tongue -signs of infection - fever or chills, cough, sore throat, pain or difficulty passing urine -signs of decreased platelets or bleeding - bruising, pinpoint red spots on the skin, black, tarry stools, nosebleeds -signs of decreased red blood cells - unusually weak or tired, fainting spells, lightheadedness -breathing  problems -changes in hearing -gout pain -low blood counts - This drug may decrease the number of white blood cells, red blood cells and platelets. You may be at increased risk for infections and bleeding. -nausea and vomiting -pain, swelling, redness or irritation at the injection site -pain, tingling, numbness in the hands or feet -problems with balance, movement -trouble passing urine or change in the amount of urine Side effects that usually do not require medical attention (report to your doctor or health care professional if they continue or are bothersome): -changes in vision -loss of appetite -metallic taste in the mouth or changes in taste This list may not describe all possible side effects. Call your doctor for medical advice about side effects. You may report side effects to FDA at 1-800-FDA-1088. Where should I keep my medicine? This drug is given in a hospital or clinic and will not be stored at home. NOTE: This sheet is a summary. It may not cover all possible information. If you have questions about this medicine, talk to your doctor, pharmacist, or health care provider.  2018 [employer]vier/Gold Standard (2008-01-25 14:40:54)

## 2017-11-11 ENCOUNTER — Ambulatory Visit: Payer: BC Managed Care – PPO

## 2017-11-11 ENCOUNTER — Telehealth: Payer: Self-pay | Admitting: *Deleted

## 2017-11-11 NOTE — Telephone Encounter (Signed)
Dr Alvy Bimler obtained Onpro authorization. Effective 11/10/17- 05/18/18. Auth # [id number]

## 2017-11-12 ENCOUNTER — Encounter: Payer: Self-pay | Admitting: Hematology and Oncology

## 2017-11-12 ENCOUNTER — Ambulatory Visit: Payer: BC Managed Care – PPO

## 2017-11-12 LAB — CA 125: Cancer Antigen (CA) 125: 4257 U/mL — ABNORMAL HIGH (ref 0.0–38.1)

## 2017-11-12 NOTE — Assessment & Plan Note (Signed)
She has acute elevated creatinine with mild hypokalemia We will omit potassium from hydration fluid I recommend she hold benazepril for 3 days and resume at half a dose I reinforced the importance of adequate hydration

## 2017-11-12 NOTE — Assessment & Plan Note (Signed)
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

## 2017-11-12 NOTE — Assessment & Plan Note (Signed)
The patient is at risk of neutropenic fever I have obtained insurance authorization to proceed with G-CSF support She does not need blood transfusion She will continue dose reduced treatment

## 2017-11-12 NOTE — Progress Notes (Signed)
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 This may potentially be ovarian or a GI in etiology.      04/30/2016 Tumor Marker    CA 125- 7149.0 (H)      05/01/2016 Procedure    US paracentesis- Successful ultrasound-guided paracentesis yielding 1.8 liters of peritoneal fluid.      05/01/2016 Imaging    US pelvis- Both transabdominal and transvaginal sonography are significantly limited by large patient habitus and ascites. Neither uterus or ovaries were visualized on this exam.      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      05/08/2016 Imaging    CT chest- No evidence of metastatic disease in the chest. Peritoneal/omental disease with abdominal ascites in the upper abdomen, incompletely visualized.       05/13/2016 Procedure    Placement of single lumen port a cath via right internal jugular vein. The catheter tip lies at the cavoatrial junction. A power injectable port a cath was placed and is ready for immediate use.      05/15/2016 Procedure    US Paracentesis- 3400 ml yellow colored ascites removed      05/15/2016 - 09/18/2016 Chemotherapy    Carboplatin/Paclitaxel every 21 days x 7 cycles      07/01/2016 Miscellaneous    Genetic Counseling by [name]-  Genetic testing was normal, and did not reveal a deleterious mutation in these genes.       07/08/2016 Imaging    CT CAP- 1. Small volume ascites, significantly  decreased. 2. Stable diffuse omental soft tissue caking and diffuse peritoneal thickening along the bilateral paracolic gutters and bilateral pelvic peritoneal reflections, consistent with peritoneal carcinomatosis. 3. Stable asymmetrically enlarged right ovary, which may represent the primary site of ovarian malignancy. 4. No evidence of metastatic disease in the chest. No new sites of metastatic disease in the abdomen or pelvis.      07/09/2016 Miscellaneous    Gyn Onc re-evaluation- modest response to therapy, 3 more cycles of chemotherapy recommended.        09/11/2016 Imaging    CT C/A/P No significant change omental soft tissue caking, consistent with metastatic disease. Mild ascites is decreased since previous study.  Increased calcification along peritoneal surface in pelvic cul-de-sac, consistent with treated peritoneal metastatic disease.  Stable 4.5cm homogeneous right pelvic mass, which favors a uterine fibroid although right ovarian neoplasm cannot definitely be excluded.  No new or progressive metastatic disease identified. No evidence of metastatic disease within the thorax.       10/14/2016 Procedure    Robotic-assisted laparoscopic total hysterectomy with bilateral salpingoophorectomy, ex lap omentectomy, radical tumor debulking by Dr. Denman George      10/17/2016 Pathology Results    Diagnosis 1. 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 - CERVIX AND ENDOMETRIUM FREE OF TUMOR. - SEE ONCOLOGY TABLE AND COMMENT. 2. Soft tissue, biopsy, umbilical nodule - HIGH GRADE SEROUS CARCINOMA. 3.  Omentum, resection for tumor - HIGH GRADE SEROUS CARCINOMA, 33 CM.      11/06/2016 - 12/25/2016 Chemotherapy    Carboplatin/Paclitaxel x 3 cycles       01/12/2017 Imaging    CT CAP- 1. Interval hysterectomy, bilateral salpingo-oophorectomy and omentectomy without evidence of  tumor recurrence. 2. No evidence of metastatic disease. 3. 5 mm nonobstructing lower pole left renal calculus.      01/13/2017 Remission    No evidence of residual disease on CT imaging.      05/04/2017 Imaging    CT CAP- New small amount of ascites within the abdomen and pelvis since 01/12/2017 which could indicate disease progression but no new identifiable tumor and no significant change in omental and mild peritoneal thickening.  No evidence of metastatic disease within the chest.  Coronary artery disease.  Aortic Atherosclerosis (ICD10-I70.0).      05/04/2017 Tumor Marker    Patient's tumor was tested for the following markers: CA 125 Results of the tumor marker test revealed 6606      05/18/2017 Imaging    ECHO; EF 60% -  65      05/25/2017 - 07/07/2017 Chemotherapy    She received Doxil and Avastin. Treatment is stopped due to disease progression      06/01/2017 Procedure    Successful ultrasound-guided therapeutic paracentesis yielding 2.8 liters of peritoneal fluid      06/22/2017 Tumor Marker    Patient's tumor was tested for the following markers: CA 125 Results of the tumor marker test revealed 13440      07/20/2017 Imaging    Mild increase in peritoneal carcinoma within abdomen pelvis since previous study. No significant change and minimal ascites.  No evidence of metastatic disease within the thorax. New mild airspace opacity in left lower lobe, consistent with inflammatory or infectious etiology.      07/30/2017 Tumor Marker    Patient's tumor was tested for the following markers: CA 125 Results of the tumor marker test revealed 12099      07/30/2017 -  Chemotherapy    The patient had gemzar. Cisplatin is added on 11/02/18      08/03/2017 - 08/05/2017 Hospital Admission    She was admitted to the hospital for management of UTI and neutropenic fever      08/13/2017 Adverse Reaction    Dose of chemotherapy is reduced due to neutropenic sepsis       08/24/2017 Tumor Marker    Patient's tumor was tested for the following markers: CA 125 Results of the tumor marker test revealed 7431      09/21/2017 Tumor Marker    Patient's tumor was tested for the following markers: CA 125 Results of the tumor marker test revealed 4176      10/09/2017 Imaging    No significant change in peritoneal carcinomatosis since previous study.  No new or progressive metastatic disease within the abdomen or pelvis.  Stable tiny nonobstructive left renal calculus. No evidence of ureteral calculi or hydronephrosis.       INTERVAL HISTORY: Please see below for problem oriented charting. She returns for day 8 of treatment after addition of cisplatin recently She complained of fatigue and achiness Denies significant nausea or vomiting She admits she is not hydrating herself adequately She denies worsening peripheral neuropathy He denies abdominal bloating, changes in bowel habits or abdominal pain  REVIEW OF SYSTEMS:   Constitutional: Denies fevers, chills or abnormal weight loss Eyes: Denies blurriness of vision Ears, nose, mouth, throat,  and face: Denies mucositis or sore throat Respiratory: Denies cough, dyspnea or wheezes Cardiovascular: Denies palpitation, chest discomfort or lower extremity swelling Gastrointestinal:  Denies nausea, heartburn or change in bowel habits Skin: Denies abnormal skin rashes Lymphatics: Denies new lymphadenopathy or easy bruising Neurological:Denies numbness, tingling or new weaknesses Behavioral/Psych: Mood is stable, no new changes  All other systems were reviewed with the patient and are negative.  I have reviewed the past medical history, past surgical history, social history and family history with the patient and they are unchanged from previous note.  ALLERGIES:  is allergic to avandia [rosiglitazone]; micronase [glyburide]; and actos [pioglitazone].  MEDICATIONS:  Current Outpatient Medications  Medication  Sig Dispense Refill  . benazepril (LOTENSIN) 10 MG tablet Take 10 mg by mouth daily.    Marland Kitchen atorvastatin (LIPITOR) 40 MG tablet TAKE 1 TABLET(40 MG) BY MOUTH DAILY 30 tablet 0  . furosemide (LASIX) 20 MG tablet TAKE 1 TABLET(20 MG) BY MOUTH TWICE DAILY 90 tablet 0  . gabapentin (NEURONTIN) 300 MG capsule Take 1 capsule (300 mg total) by mouth 3 (three) times daily. 90 capsule 11  . HYDROcodone-acetaminophen (NORCO) 10-325 MG tablet Take 1 tablet by mouth every 4 (four) hours as needed. 90 tablet 0  . insulin aspart (NOVOLOG) 100 UNIT/ML injection 90 units into insulin pump daily 100 mL 0  . lidocaine-prilocaine (EMLA) cream Apply a quarter size amount to port site 1 hour prior to chemo. Do not rub in. Cover with plastic wrap. 30 g 3  . loperamide (IMODIUM A-D) 2 MG tablet Take 1 tablet (2 mg total) by mouth 4 (four) times daily as needed for diarrhea or loose stools. 30 tablet 0  . metFORMIN (GLUCOPHAGE) 1000 MG tablet TAKE 1 TABLET BY MOUTH TWICE DAILY WITH A MEAL 180 tablet 1  . Multiple Vitamin (MULTIVITAMIN WITH MINERALS) TABS Take 1 tablet by mouth daily.    . multivitamin-lutein (OCUVITE-LUTEIN) CAPS capsule Take 1 capsule by mouth daily.    Marland Kitchen NOVOLOG 100 UNIT/ML injection USE IN INSULIN PUMP AS DIRECTED. MAX DAILY DOSE OF 110 UNITS PER DAY 120 mL 0  . omeprazole (PRILOSEC) 40 MG capsule Take 1 capsule (40 mg total) daily by mouth. 90 capsule 3  . ondansetron (ZOFRAN) 8 MG tablet Take 1 tablet (8 mg total) by mouth every 8 (eight) hours as needed for nausea or vomiting. 90 tablet 3  . ONE TOUCH ULTRA TEST test strip USE TO CHECK BLOOD SUGAR UP TO 5 TIMES A DAY 450 each 0  . polyethylene glycol (MIRALAX / GLYCOLAX) packet Take 17 g by mouth daily as needed for mild constipation.     . prochlorperazine (COMPAZINE) 10 MG tablet Take 1 tablet (10 mg total) by mouth every 6 (six) hours as needed for nausea or vomiting. 30 tablet 0  . senna (SENOKOT) 8.6 MG TABS tablet Take 1 tablet (8.6 mg total) by  mouth at bedtime as needed for moderate constipation. 120 each 0  . vitamin B-12 (CYANOCOBALAMIN) 1000 MCG tablet Take 1,000 mcg by mouth daily.    . vitamin E (VITAMIN E) 400 UNIT capsule Take 400 Units by mouth daily.     No current facility-administered medications for this visit.     PHYSICAL EXAMINATION: ECOG PERFORMANCE STATUS: 1 - Symptomatic but completely ambulatory  Vitals:   11/10/17 0818  BP: (!) 155/48  Pulse: 85  Resp: 18  Temp: 98.4 F (36.9 C)  SpO2: 97%   Filed Weights   11/10/17 0818  Weight:  206 lb 6.4 oz (93.6 kg)    GENERAL:alert, no distress and comfortable.  She is morbidly obese SKIN: skin color, texture, turgor are normal, no rashes or significant lesions EYES: normal, Conjunctiva are pink and non-injected, sclera clear OROPHARYNX:no exudate, no erythema and lips, buccal mucosa, and tongue normal  NECK: supple, thyroid normal size, non-tender, without nodularity LYMPH:  no palpable lymphadenopathy in the cervical, axillary or inguinal LUNGS: clear to auscultation and percussion with normal breathing effort HEART: regular rate & rhythm and no murmurs and no lower extremity edema ABDOMEN:abdomen soft, non-tender and normal bowel sounds Musculoskeletal:no cyanosis of digits and no clubbing  NEURO: alert & oriented x 3 with fluent speech, no focal motor/sensory deficits  LABORATORY DATA:  I have reviewed the data as listed    Component Value Date/Time   NA 136 11/10/2017 0734   NA 139 11/02/2017 0756   K 5.0 (H) 11/10/2017 0734   K 4.7 11/02/2017 0756   CL 102 11/10/2017 0734   CO2 25 11/10/2017 0734   CO2 26 11/02/2017 0756   GLUCOSE 205 (H) 11/10/2017 0734   GLUCOSE 140 11/02/2017 0756   BUN 38 (H) 11/10/2017 0734   BUN 23.7 11/02/2017 0756   CREATININE 1.38 (H) 11/10/2017 0734   CREATININE 0.9 11/02/2017 0756   CALCIUM 9.2 11/10/2017 0734   CALCIUM 9.4 11/02/2017 0756   PROT 6.7 11/10/2017 0734   PROT 7.0 11/02/2017 0756   ALBUMIN 3.5  11/10/2017 0734   ALBUMIN 3.5 11/02/2017 0756   AST 15 11/10/2017 0734   AST 18 11/02/2017 0756   ALT 24 11/10/2017 0734   ALT 23 11/02/2017 0756   ALKPHOS 87 11/10/2017 0734   ALKPHOS 108 11/02/2017 0756   BILITOT 0.3 11/10/2017 0734   BILITOT 0.24 11/02/2017 0756   GFRNONAA 39 (L) 11/10/2017 0734   GFRAA 46 (L) 11/10/2017 0734    No results found for: SPEP, UPEP  Lab Results  Component Value Date   WBC 2.6 (L) 11/10/2017   NEUTROABS 2.0 11/10/2017   HGB 8.7 (L) 11/10/2017   HCT 26.4 (L) 11/10/2017   MCV 105.5 (H) 11/10/2017   PLT 135 (L) 11/10/2017      Chemistry      Component Value Date/Time   NA 136 11/10/2017 0734   NA 139 11/02/2017 0756   K 5.0 (H) 11/10/2017 0734   K 4.7 11/02/2017 0756   CL 102 11/10/2017 0734   CO2 25 11/10/2017 0734   CO2 26 11/02/2017 0756   BUN 38 (H) 11/10/2017 0734   BUN 23.7 11/02/2017 0756   CREATININE 1.38 (H) 11/10/2017 0734   CREATININE 0.9 11/02/2017 0756      Component Value Date/Time   CALCIUM 9.2 11/10/2017 0734   CALCIUM 9.4 11/02/2017 0756   ALKPHOS 87 11/10/2017 0734   ALKPHOS 108 11/02/2017 0756   AST 15 11/10/2017 0734   AST 18 11/02/2017 0756   ALT 24 11/10/2017 0734   ALT 23 11/02/2017 0756   BILITOT 0.3 11/10/2017 0734   BILITOT 0.24 11/02/2017 0756     ASSESSMENT & PLAN:  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[place]) The patient has progressive pancytopenia since addition of cisplatin She complained of more profound fatigue and achiness with treatment She denies significant worsening peripheral neuropathy We will continue treatment as scheduled After extensive discussion with her insurance company, I was able to get insurance authorization to proceed with G-CSF support with Neulasta after each cycle  Pancytopenia, acquired ([place]) The patient is at risk of  neutropenic fever I have obtained insurance authorization to proceed with G-CSF support She does not need blood transfusion She will continue dose  reduced treatment  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  All questions were answered. The patient knows to call the clinic with any problems, questions or concerns. No barriers to learning was detected. I spent 30 minutes counseling the patient face to face. The total time spent in the appointment was 40 minutes and more than 50% was on counseling and review of test results     Heath Lark, MD 11/12/2017 6:39 AM

## 2017-11-12 NOTE — Assessment & Plan Note (Signed)
The patient has progressive pancytopenia since addition of cisplatin She complained of more profound fatigue and achiness with treatment She denies significant worsening peripheral neuropathy We will continue treatment as scheduled After extensive discussion with her insurance company, I was able to get insurance authorization to proceed with G-CSF support with Neulasta after each cycle

## 2017-11-16 ENCOUNTER — Encounter: Payer: Self-pay | Admitting: Hematology and Oncology

## 2017-11-24 ENCOUNTER — Inpatient Hospital Stay: Payer: BC Managed Care – PPO

## 2017-11-24 ENCOUNTER — Encounter: Payer: Self-pay | Admitting: Hematology and Oncology

## 2017-11-24 ENCOUNTER — Telehealth: Payer: Self-pay | Admitting: Hematology and Oncology

## 2017-11-24 ENCOUNTER — Inpatient Hospital Stay (HOSPITAL_BASED_OUTPATIENT_CLINIC_OR_DEPARTMENT_OTHER): Payer: BC Managed Care – PPO | Admitting: Hematology and Oncology

## 2017-11-24 VITALS — BP 145/88 | HR 88 | Temp 98.4°F | Resp 16

## 2017-11-24 VITALS — BP 147/61 | HR 93 | Temp 98.3°F | Resp 18 | Ht 63.0 in | Wt 214.3 lb

## 2017-11-24 DIAGNOSIS — G620 Drug-induced polyneuropathy: Secondary | ICD-10-CM | POA: Diagnosis not present

## 2017-11-24 DIAGNOSIS — E119 Type 2 diabetes mellitus without complications: Secondary | ICD-10-CM

## 2017-11-24 DIAGNOSIS — R5383 Other fatigue: Secondary | ICD-10-CM

## 2017-11-24 DIAGNOSIS — C481 Malignant neoplasm of specified parts of peritoneum: Secondary | ICD-10-CM

## 2017-11-24 DIAGNOSIS — Z79899 Other long term (current) drug therapy: Secondary | ICD-10-CM | POA: Diagnosis not present

## 2017-11-24 DIAGNOSIS — T451X5A Adverse effect of antineoplastic and immunosuppressive drugs, initial encounter: Secondary | ICD-10-CM

## 2017-11-24 DIAGNOSIS — Z794 Long term (current) use of insulin: Secondary | ICD-10-CM

## 2017-11-24 DIAGNOSIS — C548 Malignant neoplasm of overlapping sites of corpus uteri: Secondary | ICD-10-CM | POA: Diagnosis not present

## 2017-11-24 DIAGNOSIS — C549 Malignant neoplasm of corpus uteri, unspecified: Secondary | ICD-10-CM | POA: Diagnosis not present

## 2017-11-24 DIAGNOSIS — C569 Malignant neoplasm of unspecified ovary: Secondary | ICD-10-CM

## 2017-11-24 DIAGNOSIS — E538 Deficiency of other specified B group vitamins: Secondary | ICD-10-CM

## 2017-11-24 DIAGNOSIS — C786 Secondary malignant neoplasm of retroperitoneum and peritoneum: Secondary | ICD-10-CM | POA: Diagnosis not present

## 2017-11-24 DIAGNOSIS — D6481 Anemia due to antineoplastic chemotherapy: Secondary | ICD-10-CM

## 2017-11-24 DIAGNOSIS — D649 Anemia, unspecified: Secondary | ICD-10-CM

## 2017-11-24 DIAGNOSIS — T451X5S Adverse effect of antineoplastic and immunosuppressive drugs, sequela: Secondary | ICD-10-CM

## 2017-11-24 DIAGNOSIS — K5900 Constipation, unspecified: Secondary | ICD-10-CM | POA: Diagnosis not present

## 2017-11-24 DIAGNOSIS — D61818 Other pancytopenia: Secondary | ICD-10-CM | POA: Diagnosis not present

## 2017-11-24 LAB — COMPREHENSIVE METABOLIC PANEL
ALK PHOS: 122 U/L (ref 40–150)
ALT: 18 U/L (ref 0–55)
AST: 18 U/L (ref 5–34)
Albumin: 3.5 g/dL (ref 3.5–5.0)
Anion gap: 12 — ABNORMAL HIGH (ref 3–11)
BILIRUBIN TOTAL: 0.2 mg/dL (ref 0.2–1.2)
BUN: 22 mg/dL (ref 7–26)
CALCIUM: 9.1 mg/dL (ref 8.4–10.4)
CO2: 25 mmol/L (ref 22–29)
CREATININE: 0.98 mg/dL (ref 0.60–1.10)
Chloride: 102 mmol/L (ref 98–109)
GFR, EST NON AFRICAN AMERICAN: 60 mL/min — AB (ref >60)
Glucose, Bld: 122 mg/dL (ref 70–140)
Potassium: 4.6 mmol/L (ref 3.3–4.7)
Sodium: 139 mmol/L (ref 136–145)
Total Protein: 6.5 g/dL (ref 6.4–8.3)

## 2017-11-24 LAB — CBC WITH DIFFERENTIAL/PLATELET
BASOS ABS: 0 10*3/uL (ref 0.0–0.1)
Basophils Relative: 0 %
Eosinophils Absolute: 0.1 10*3/uL (ref 0.0–0.5)
Eosinophils Relative: 1 %
HEMATOCRIT: 23.2 % — AB (ref 34.8–46.6)
Hemoglobin: 7.4 g/dL — ABNORMAL LOW (ref 11.6–15.9)
LYMPHS PCT: 4 %
Lymphs Abs: 0.6 10*3/uL — ABNORMAL LOW (ref 0.9–3.3)
MCH: 34.2 pg — AB (ref 25.1–34.0)
MCHC: 32 g/dL (ref 31.5–36.0)
MCV: 106.9 fL — AB (ref 79.5–101.0)
Monocytes Absolute: 1.3 10*3/uL — ABNORMAL HIGH (ref 0.1–0.9)
Monocytes Relative: 10 %
NEUTROS ABS: 10.9 10*3/uL — AB (ref 1.5–6.5)
Neutrophils Relative %: 85 %
Platelets: 67 10*3/uL — ABNORMAL LOW (ref 145–400)
RBC: 2.17 MIL/uL — AB (ref 3.70–5.45)
RDW: 19.4 % — ABNORMAL HIGH (ref 11.2–16.1)
WBC: 12.9 10*3/uL — AB (ref 3.9–10.3)

## 2017-11-24 LAB — ABO/RH: ABO/RH(D): O POS

## 2017-11-24 LAB — PREPARE RBC (CROSSMATCH)

## 2017-11-24 MED ORDER — HEPARIN SOD (PORK) LOCK FLUSH 100 UNIT/ML IV SOLN
500.0000 [IU] | Freq: Every day | INTRAVENOUS | Status: AC | PRN
  Administered 2017-11-24: 500 [IU]
  Filled 2017-11-24: qty 5

## 2017-11-24 MED ORDER — FUROSEMIDE 10 MG/ML IJ SOLN
INTRAMUSCULAR | Status: AC
  Filled 2017-11-24: qty 2

## 2017-11-24 MED ORDER — ACETAMINOPHEN 325 MG PO TABS
ORAL_TABLET | ORAL | Status: AC
Start: 2017-11-24 — End: 2017-11-24
  Filled 2017-11-24: qty 2

## 2017-11-24 MED ORDER — SODIUM CHLORIDE 0.9% FLUSH
10.0000 mL | Freq: Once | INTRAVENOUS | Status: AC
  Administered 2017-11-24: 10 mL
  Filled 2017-11-24: qty 10

## 2017-11-24 MED ORDER — PEGFILGRASTIM INJECTION 6 MG/0.6ML ~~LOC~~: 6.0000 mg | PREFILLED_SYRINGE | Freq: Once | SUBCUTANEOUS | Status: DC

## 2017-11-24 MED ORDER — SODIUM CHLORIDE 0.9% FLUSH
10.0000 mL | INTRAVENOUS | Status: AC | PRN
  Administered 2017-11-24: 10 mL
  Filled 2017-11-24: qty 10

## 2017-11-24 MED ORDER — ACETAMINOPHEN 325 MG PO TABS
650.0000 mg | ORAL_TABLET | Freq: Once | ORAL | Status: AC
  Administered 2017-11-24: 650 mg via ORAL

## 2017-11-24 MED ORDER — DIPHENHYDRAMINE HCL 25 MG PO CAPS
25.0000 mg | ORAL_CAPSULE | Freq: Once | ORAL | Status: AC
  Administered 2017-11-24: 25 mg via ORAL

## 2017-11-24 MED ORDER — DIPHENHYDRAMINE HCL 25 MG PO CAPS
ORAL_CAPSULE | ORAL | Status: AC
Start: 2017-11-24 — End: 2017-11-24
  Filled 2017-11-24: qty 1

## 2017-11-24 MED ORDER — SODIUM CHLORIDE 0.9 % IV SOLN
250.0000 mL | Freq: Once | INTRAVENOUS | Status: AC
  Administered 2017-11-24: 250 mL via INTRAVENOUS

## 2017-11-24 MED ORDER — FUROSEMIDE 10 MG/ML IJ SOLN
20.0000 mg | Freq: Once | INTRAMUSCULAR | Status: AC
  Administered 2017-11-24: 20 mg via INTRAVENOUS

## 2017-11-24 NOTE — Telephone Encounter (Signed)
Patient declined AVs and calendar of upcoming February appointments. Patient scheduled per 1/22 los.

## 2017-11-24 NOTE — Progress Notes (Signed)
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 This may potentially be ovarian or a GI in etiology.      04/30/2016 Tumor Marker    CA 125- 7149.0 (H)      05/01/2016 Procedure    US paracentesis- Successful ultrasound-guided paracentesis yielding 1.8 liters of peritoneal fluid.      05/01/2016 Imaging    US pelvis- Both transabdominal and transvaginal sonography are significantly limited by large patient habitus and ascites. Neither uterus or ovaries were visualized on this exam.      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      05/08/2016 Imaging    CT chest- No evidence of metastatic disease in the chest. Peritoneal/omental disease with abdominal ascites in the upper abdomen, incompletely visualized.       05/13/2016 Procedure    Placement of single lumen port a cath via right internal jugular vein. The catheter tip lies at the cavoatrial junction. A power injectable port a cath was placed and is ready for immediate use.      05/15/2016 Procedure    US Paracentesis- 3400 ml yellow colored ascites removed      05/15/2016 - 09/18/2016 Chemotherapy    Carboplatin/Paclitaxel every 21 days x 7 cycles      07/01/2016 Miscellaneous    Genetic Counseling by [name]-  Genetic testing was normal, and did not reveal a deleterious mutation in these genes.       07/08/2016 Imaging    CT CAP- 1. Small volume ascites, significantly  decreased. 2. Stable diffuse omental soft tissue caking and diffuse peritoneal thickening along the bilateral paracolic gutters and bilateral pelvic peritoneal reflections, consistent with peritoneal carcinomatosis. 3. Stable asymmetrically enlarged right ovary, which may represent the primary site of ovarian malignancy. 4. No evidence of metastatic disease in the chest. No new sites of metastatic disease in the abdomen or pelvis.      07/09/2016 Miscellaneous    Gyn Onc re-evaluation- modest response to therapy, 3 more cycles of chemotherapy recommended.        09/11/2016 Imaging    CT C/A/P No significant change omental soft tissue caking, consistent with metastatic disease. Mild ascites is decreased since previous study.  Increased calcification along peritoneal surface in pelvic cul-de-sac, consistent with treated peritoneal metastatic disease.  Stable 4.5cm homogeneous right pelvic mass, which favors a uterine fibroid although right ovarian neoplasm cannot definitely be excluded.  No new or progressive metastatic disease identified. No evidence of metastatic disease within the thorax.       10/14/2016 Procedure    Robotic-assisted laparoscopic total hysterectomy with bilateral salpingoophorectomy, ex lap omentectomy, radical tumor debulking by Dr. Denman George      10/17/2016 Pathology Results    Diagnosis 1. 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 - CERVIX AND ENDOMETRIUM FREE OF TUMOR. - SEE ONCOLOGY TABLE AND COMMENT. 2. Soft tissue, biopsy, umbilical nodule - HIGH GRADE SEROUS CARCINOMA. 3.  Omentum, resection for tumor - HIGH GRADE SEROUS CARCINOMA, 33 CM.      11/06/2016 - 12/25/2016 Chemotherapy    Carboplatin/Paclitaxel x 3 cycles       01/12/2017 Imaging    CT CAP- 1. Interval hysterectomy, bilateral salpingo-oophorectomy and omentectomy without evidence of  tumor recurrence. 2. No evidence of metastatic disease. 3. 5 mm nonobstructing lower pole left renal calculus.      01/13/2017 Remission    No evidence of residual disease on CT imaging.      05/04/2017 Imaging    CT CAP- New small amount of ascites within the abdomen and pelvis since 01/12/2017 which could indicate disease progression but no new identifiable tumor and no significant change in omental and mild peritoneal thickening.  No evidence of metastatic disease within the chest.  Coronary artery disease.  Aortic Atherosclerosis (ICD10-I70.0).      05/04/2017 Tumor Marker    Patient's tumor was tested for the following markers: CA 125 Results of the tumor marker test revealed 9675      05/18/2017 Imaging    ECHO; EF 60% -  65      05/25/2017 - 07/07/2017 Chemotherapy    She received Doxil and Avastin. Treatment is stopped due to disease progression      06/01/2017 Procedure    Successful ultrasound-guided therapeutic paracentesis yielding 2.8 liters of peritoneal fluid      06/22/2017 Tumor Marker    Patient's tumor was tested for the following markers: CA 125 Results of the tumor marker test revealed 13440      07/20/2017 Imaging    Mild increase in peritoneal carcinoma within abdomen pelvis since previous study. No significant change and minimal ascites.  No evidence of metastatic disease within the thorax. New mild airspace opacity in left lower lobe, consistent with inflammatory or infectious etiology.      07/30/2017 Tumor Marker    Patient's tumor was tested for the following markers: CA 125 Results of the tumor marker test revealed 12099      07/30/2017 -  Chemotherapy    The patient had gemzar. Cisplatin is added on 11/02/18      08/03/2017 - 08/05/2017 Hospital Admission    She was admitted to the hospital for management of UTI and neutropenic fever      08/13/2017 Adverse Reaction    Dose of chemotherapy is reduced due to neutropenic sepsis       08/24/2017 Tumor Marker    Patient's tumor was tested for the following markers: CA 125 Results of the tumor marker test revealed 7431      09/21/2017 Tumor Marker    Patient's tumor was tested for the following markers: CA 125 Results of the tumor marker test revealed 4176      10/09/2017 Imaging    No significant change in peritoneal carcinomatosis since previous study.  No new or progressive metastatic disease within the abdomen or pelvis.  Stable tiny nonobstructive left renal calculus. No evidence of ureteral calculi or hydronephrosis.      11/10/2017 Tumor Marker    Patient's tumor was tested for the following markers: CA 125 Results of the tumor marker test revealed 4257       INTERVAL HISTORY: Please see below for problem oriented charting. She returns for further follow-up She complained of profound fatigue With increased dose of gabapentin, she felt better Neuropathy is better controlled She has some mild constipation Denies recent nausea or vomiting The patient denies any recent signs or symptoms of  bleeding such as spontaneous epistaxis, hematuria or hematochezia.  REVIEW OF SYSTEMS:   Constitutional: Denies fevers, chills or abnormal weight loss Eyes: Denies blurriness of vision Ears, nose, mouth, throat, and face: Denies mucositis or sore throat Respiratory: Denies cough, dyspnea or wheezes Cardiovascular: Denies palpitation, chest discomfort or lower extremity swelling Skin: Denies abnormal skin rashes Lymphatics: Denies new lymphadenopathy or easy bruising Neurological:Denies numbness, tingling or new weaknesses Behavioral/Psych: Mood is stable, no new changes  All other systems were reviewed with the patient and are negative.  I have reviewed the past medical history, past surgical history, social history and family history with the patient and they are unchanged from previous note.  ALLERGIES:  is allergic to avandia [rosiglitazone]; micronase  [glyburide]; and actos [pioglitazone].  MEDICATIONS:  Current Outpatient Medications  Medication Sig Dispense Refill  . atorvastatin (LIPITOR) 40 MG tablet TAKE 1 TABLET(40 MG) BY MOUTH DAILY 30 tablet 0  . benazepril (LOTENSIN) 10 MG tablet Take 10 mg by mouth daily.    . furosemide (LASIX) 20 MG tablet TAKE 1 TABLET(20 MG) BY MOUTH TWICE DAILY 90 tablet 0  . gabapentin (NEURONTIN) 300 MG capsule Take 1 capsule (300 mg total) by mouth 3 (three) times daily. 90 capsule 11  . HYDROcodone-acetaminophen (NORCO) 10-325 MG tablet Take 1 tablet by mouth every 4 (four) hours as needed. 90 tablet 0  . insulin aspart (NOVOLOG) 100 UNIT/ML injection 90 units into insulin pump daily 100 mL 0  . lidocaine-prilocaine (EMLA) cream Apply a quarter size amount to port site 1 hour prior to chemo. Do not rub in. Cover with plastic wrap. 30 g 3  . loperamide (IMODIUM A-D) 2 MG tablet Take 1 tablet (2 mg total) by mouth 4 (four) times daily as needed for diarrhea or loose stools. 30 tablet 0  . metFORMIN (GLUCOPHAGE) 1000 MG tablet TAKE 1 TABLET BY MOUTH TWICE DAILY WITH A MEAL 180 tablet 1  . Multiple Vitamin (MULTIVITAMIN WITH MINERALS) TABS Take 1 tablet by mouth daily.    . multivitamin-lutein (OCUVITE-LUTEIN) CAPS capsule Take 1 capsule by mouth daily.    Marland Kitchen NOVOLOG 100 UNIT/ML injection USE IN INSULIN PUMP AS DIRECTED. MAX DAILY DOSE OF 110 UNITS PER DAY 120 mL 0  . omeprazole (PRILOSEC) 40 MG capsule Take 1 capsule (40 mg total) daily by mouth. 90 capsule 3  . ondansetron (ZOFRAN) 8 MG tablet Take 1 tablet (8 mg total) by mouth every 8 (eight) hours as needed for nausea or vomiting. 90 tablet 3  . ONE TOUCH ULTRA TEST test strip USE TO CHECK BLOOD SUGAR UP TO 5 TIMES A DAY 450 each 0  . polyethylene glycol (MIRALAX / GLYCOLAX) packet Take 17 g by mouth daily as needed for mild constipation.     . prochlorperazine (COMPAZINE) 10 MG tablet Take 1 tablet (10 mg total) by mouth every 6 (six) hours as needed for  nausea or vomiting. 30 tablet 0  . senna (SENOKOT) 8.6 MG TABS tablet Take 1 tablet (8.6 mg total) by mouth at bedtime as needed for moderate constipation. 120 each 0  . vitamin B-12 (CYANOCOBALAMIN) 1000 MCG tablet Take 1,000 mcg by mouth daily.    . vitamin E (VITAMIN E) 400 UNIT capsule Take 400 Units by mouth daily.     No current facility-administered medications for this visit.     PHYSICAL EXAMINATION: ECOG PERFORMANCE STATUS: 2 - Symptomatic, <50% confined to bed  Vitals:   11/24/17 0856  BP: (!) 147/61  Pulse: 93  Resp: 18  Temp: 98.3 F (36.8 C)  SpO2: 97%   Filed Weights   11/24/17 0856  Weight: 214 lb 4.8 oz (97.2 kg)    GENERAL:alert, no distress and comfortable.  She is moderately obese SKIN: skin color is pale, texture, turgor are normal, no rashes or significant lesions EYES: normal, Conjunctiva are pale and non-injected, sclera clear OROPHARYNX:no exudate, no erythema and lips, buccal mucosa, and tongue normal  NECK: supple, thyroid normal size, non-tender, without nodularity LYMPH:  no palpable lymphadenopathy in the cervical, axillary or inguinal LUNGS: clear to auscultation and percussion with normal breathing effort HEART: regular rate & rhythm and no murmurs and no lower extremity edema ABDOMEN:abdomen soft, non-tender and normal bowel sounds Musculoskeletal:no cyanosis of digits and no clubbing  NEURO: alert & oriented x 3 with fluent speech, no focal motor/sensory deficits  LABORATORY DATA:  I have reviewed the data as listed    Component Value Date/Time   NA 139 11/24/2017 0840   NA 139 11/02/2017 0756   K 4.6 11/24/2017 0840   K 4.7 11/02/2017 0756   CL 102 11/24/2017 0840   CO2 25 11/24/2017 0840   CO2 26 11/02/2017 0756   GLUCOSE 122 11/24/2017 0840   GLUCOSE 140 11/02/2017 0756   BUN 22 11/24/2017 0840   BUN 23.7 11/02/2017 0756   CREATININE 0.98 11/24/2017 0840   CREATININE 0.9 11/02/2017 0756   CALCIUM 9.1 11/24/2017 0840   CALCIUM  9.4 11/02/2017 0756   PROT 6.5 11/24/2017 0840   PROT 7.0 11/02/2017 0756   ALBUMIN 3.5 11/24/2017 0840   ALBUMIN 3.5 11/02/2017 0756   AST 18 11/24/2017 0840   AST 18 11/02/2017 0756   ALT 18 11/24/2017 0840   ALT 23 11/02/2017 0756   ALKPHOS 122 11/24/2017 0840   ALKPHOS 108 11/02/2017 0756   BILITOT 0.2 11/24/2017 0840   BILITOT 0.24 11/02/2017 0756   GFRNONAA 60 (L) 11/24/2017 0840   GFRAA >60 11/24/2017 0840    No results found for: SPEP, UPEP  Lab Results  Component Value Date   WBC 12.9 (H) 11/24/2017   NEUTROABS 10.9 (H) 11/24/2017   HGB 7.4 (L) 11/24/2017   HCT 23.2 (L) 11/24/2017   MCV 106.9 (H) 11/24/2017   PLT 67 (L) 11/24/2017      Chemistry      Component Value Date/Time   NA 139 11/24/2017 0840   NA 139 11/02/2017 0756   K 4.6 11/24/2017 0840   K 4.7 11/02/2017 0756   CL 102 11/24/2017 0840   CO2 25 11/24/2017 0840   CO2 26 11/02/2017 0756   BUN 22 11/24/2017 0840   BUN 23.7 11/02/2017 0756   CREATININE 0.98 11/24/2017 0840   CREATININE 0.9 11/02/2017 0756      Component Value Date/Time   CALCIUM 9.1 11/24/2017 0840   CALCIUM 9.4 11/02/2017 0756   ALKPHOS 122 11/24/2017 0840   ALKPHOS 108 11/02/2017 0756   AST 18 11/24/2017 0840   AST 18 11/02/2017 0756   ALT 18 11/24/2017 0840   ALT 23 11/02/2017 0756   BILITOT 0.2 11/24/2017 0840   BILITOT 0.24 11/02/2017 0756      ASSESSMENT & PLAN:  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 The patient is  symptomatic from anemia and the hemoglobin level is critically low.  Some of the side-effects to be expected including risks of transfusion reactions, chills, infection, syndrome of volume overload and risk of  hospitalization from various reasons and the patient is willing to proceed and went ahead to sign consent today. I plan to give HER-2 units of blood transfusion Due to risk of fluid overload, I will give her Lasix IV x1 dose after first unit of blood  Chemotherapy-induced neuropathy ([place]) She felt better with higher dose of gabapentin I recommend increasing the dose of 300 mg 3 times a day and reassess  Pancytopenia, acquired ([place]) We will hold treatment and plan to reduce dose of chemotherapy in the future I would prefer to restart chemotherapy once her platelet count is greater than 100,000 and hemoglobin at least 8 g   Orders Placed This Encounter  Procedures  . Sample to Blood Bank    Standing Status:   Standing    Number of Occurrences:   33    Standing Expiration Date:   11/24/2018   All questions were answered. The patient knows to call the clinic with any problems, questions or concerns. No barriers to learning was detected. I spent 25 minutes counseling the patient face to face. The total time spent in the appointment was 30 minutes and more than 50% was on counseling and review of test results     Heath Lark, MD 11/24/2017 5:09 PM

## 2017-11-24 NOTE — Patient Instructions (Signed)
Implanted Port Home Guide An implanted port is a type of central line that is placed under the skin. Central lines are used to provide IV access when treatment or nutrition needs to be given through a person's veins. Implanted ports are used for long-term IV access. An implanted port may be placed because:  You need IV medicine that would be irritating to the small veins in your hands or arms.  You need long-term IV medicines, such as antibiotics.  You need IV nutrition for a long period.  You need frequent blood draws for lab tests.  You need dialysis.  Implanted ports are usually placed in the chest area, but they can also be placed in the upper arm, the abdomen, or the leg. An implanted port has two main parts:  Reservoir. The reservoir is round and will appear as a small, raised area under your skin. The reservoir is the part where a needle is inserted to give medicines or draw blood.  Catheter. The catheter is a thin, flexible tube that extends from the reservoir. The catheter is placed into a large vein. Medicine that is inserted into the reservoir goes into the catheter and then into the vein.  How will I care for my incision site? Do not get the incision site wet. Bathe or shower as directed by your health care provider. How is my port accessed? Special steps must be taken to access the port:  Before the port is accessed, a numbing cream can be placed on the skin. This helps numb the skin over the port site.  Your health care provider uses a sterile technique to access the port. ? Your health care provider must put on a mask and sterile gloves. ? The skin over your port is cleaned carefully with an antiseptic and allowed to dry. ? The port is gently pinched between sterile gloves, and a needle is inserted into the port.  Only "non-coring" port needles should be used to access the port. Once the port is accessed, a blood return should be checked. This helps ensure that the port  is in the vein and is not clogged.  If your port needs to remain accessed for a constant infusion, a clear (transparent) bandage will be placed over the needle site. The bandage and needle will need to be changed every week, or as directed by your health care provider.  Keep the bandage covering the needle clean and dry. Do not get it wet. Follow your health care provider's instructions on how to take a shower or bath while the port is accessed.  If your port does not need to stay accessed, no bandage is needed over the port.  What is flushing? Flushing helps keep the port from getting clogged. Follow your health care provider's instructions on how and when to flush the port. Ports are usually flushed with saline solution or a medicine called heparin. The need for flushing will depend on how the port is used.  If the port is used for intermittent medicines or blood draws, the port will need to be flushed: ? After medicines have been given. ? After blood has been drawn. ? As part of routine maintenance.  If a constant infusion is running, the port may not need to be flushed.  How long will my port stay implanted? The port can stay in for as long as your health care provider thinks it is needed. When it is time for the port to come out, surgery will be   done to remove it. The procedure is similar to the one performed when the port was put in. When should I seek immediate medical care? When you have an implanted port, you should seek immediate medical care if:  You notice a bad smell coming from the incision site.  You have swelling, redness, or drainage at the incision site.  You have more swelling or pain at the port site or the surrounding area.  You have a fever that is not controlled with medicine.  This information is not intended to replace advice given to you by your health care provider. Make sure you discuss any questions you have with your health care provider. Document  Released: 10/20/2005 Document Revised: 03/27/2016 Document Reviewed: 06/27/2013 Elsevier Interactive Patient Education  2017 [employer]  

## 2017-11-24 NOTE — Assessment & Plan Note (Signed)
She is profoundly pancytopenic from recent chemotherapy I will hold treatment today I plan to further reduce the dose of Gemzar to 500 mg/m for future treatment

## 2017-11-24 NOTE — Assessment & Plan Note (Signed)
She felt better with higher dose of gabapentin I recommend increasing the dose of 300 mg 3 times a day and reassess

## 2017-11-24 NOTE — Assessment & Plan Note (Signed)
We discussed some of the risks, benefits, and alternatives of blood transfusions. The patient is symptomatic from anemia and the hemoglobin level is critically low.  Some of the side-effects to be expected including risks of transfusion reactions, chills, infection, syndrome of volume overload and risk of hospitalization from various reasons and the patient is willing to proceed and went ahead to sign consent today. I plan to give HER-2 units of blood transfusion Due to risk of fluid overload, I will give her Lasix IV x1 dose after first unit of blood

## 2017-11-24 NOTE — Patient Instructions (Signed)

## 2017-11-24 NOTE — Assessment & Plan Note (Signed)
We will hold treatment and plan to reduce dose of chemotherapy in the future I would prefer to restart chemotherapy once her platelet count is greater than 100,000 and hemoglobin at least 8 g

## 2017-11-25 LAB — TYPE AND SCREEN
ABO/RH(D): O POS
ANTIBODY SCREEN: NEGATIVE
Unit division: 0
Unit division: 0

## 2017-11-25 LAB — BPAM RBC
Blood Product Expiration Date: 201902192359
Blood Product Expiration Date: 201902192359
ISSUE DATE / TIME: 201901221124
ISSUE DATE / TIME: 201901221124
Unit Type and Rh: 5100
Unit Type and Rh: 5100

## 2017-11-26 LAB — CA 125: CANCER ANTIGEN (CA) 125: 3377 U/mL — AB (ref 0.0–38.1)

## 2017-11-30 ENCOUNTER — Other Ambulatory Visit: Payer: Self-pay | Admitting: *Deleted

## 2017-11-30 ENCOUNTER — Telehealth: Payer: Self-pay | Admitting: *Deleted

## 2017-11-30 DIAGNOSIS — C569 Malignant neoplasm of unspecified ovary: Secondary | ICD-10-CM

## 2017-11-30 MED ORDER — FUROSEMIDE 20 MG PO TABS: 20.0000 mg | ORAL_TABLET | Freq: Three times a day (TID) | ORAL | 0 refills | Status: DC

## 2017-11-30 NOTE — Telephone Encounter (Signed)
PLs refill electronically for 30 days supply

## 2017-11-30 NOTE — Telephone Encounter (Signed)
Pt left a message stating she needs refill of lasix 20 mg. States Dr Alvy Bimler increased from BID to TID.

## 2017-12-01 ENCOUNTER — Inpatient Hospital Stay: Payer: BC Managed Care – PPO

## 2017-12-01 ENCOUNTER — Other Ambulatory Visit: Payer: Self-pay | Admitting: *Deleted

## 2017-12-01 VITALS — BP 132/50 | HR 87 | Temp 98.0°F | Resp 18

## 2017-12-01 DIAGNOSIS — T451X5A Adverse effect of antineoplastic and immunosuppressive drugs, initial encounter: Principal | ICD-10-CM

## 2017-12-01 DIAGNOSIS — C569 Malignant neoplasm of unspecified ovary: Secondary | ICD-10-CM

## 2017-12-01 DIAGNOSIS — C548 Malignant neoplasm of overlapping sites of corpus uteri: Secondary | ICD-10-CM | POA: Diagnosis not present

## 2017-12-01 DIAGNOSIS — C481 Malignant neoplasm of specified parts of peritoneum: Secondary | ICD-10-CM

## 2017-12-01 DIAGNOSIS — D6481 Anemia due to antineoplastic chemotherapy: Secondary | ICD-10-CM

## 2017-12-01 LAB — CBC WITH DIFFERENTIAL/PLATELET
BASOS ABS: 0 10*3/uL (ref 0.0–0.1)
BASOS PCT: 0 %
EOS PCT: 2 %
Eosinophils Absolute: 0.2 10*3/uL (ref 0.0–0.5)
HEMATOCRIT: 30.1 % — AB (ref 34.8–46.6)
Hemoglobin: 10.1 g/dL — ABNORMAL LOW (ref 11.6–15.9)
Lymphocytes Relative: 6 %
Lymphs Abs: 0.4 10*3/uL — ABNORMAL LOW (ref 0.9–3.3)
MCH: 34.1 pg — ABNORMAL HIGH (ref 25.1–34.0)
MCHC: 33.5 g/dL (ref 31.5–36.0)
MCV: 101.9 fL — ABNORMAL HIGH (ref 79.5–101.0)
MONO ABS: 1.1 10*3/uL — AB (ref 0.1–0.9)
Monocytes Relative: 14 %
NEUTROS ABS: 6.3 10*3/uL (ref 1.5–6.5)
Neutrophils Relative %: 78 %
PLATELETS: 165 10*3/uL (ref 145–400)
RBC: 2.96 MIL/uL — ABNORMAL LOW (ref 3.70–5.45)
RDW: 21.1 % — ABNORMAL HIGH (ref 11.2–16.1)
WBC: 8.1 10*3/uL (ref 3.9–10.3)

## 2017-12-01 LAB — COMPREHENSIVE METABOLIC PANEL
ALBUMIN: 3.6 g/dL (ref 3.5–5.0)
ALT: 14 U/L (ref 0–55)
AST: 15 U/L (ref 5–34)
Alkaline Phosphatase: 116 U/L (ref 40–150)
Anion gap: 11 (ref 3–11)
BUN: 29 mg/dL — AB (ref 7–26)
CHLORIDE: 105 mmol/L (ref 98–109)
CO2: 24 mmol/L (ref 22–29)
Calcium: 9 mg/dL (ref 8.4–10.4)
Creatinine, Ser: 1.25 mg/dL — ABNORMAL HIGH (ref 0.60–1.10)
GFR calc Af Amer: 52 mL/min — ABNORMAL LOW (ref >60)
GFR calc non Af Amer: 44 mL/min — ABNORMAL LOW (ref >60)
GLUCOSE: 126 mg/dL (ref 70–140)
POTASSIUM: 4.3 mmol/L (ref 3.3–4.7)
Sodium: 140 mmol/L (ref 136–145)
Total Bilirubin: 0.2 mg/dL (ref 0.2–1.2)
Total Protein: 6.8 g/dL (ref 6.4–8.3)

## 2017-12-01 LAB — SAMPLE TO BLOOD BANK

## 2017-12-01 MED ORDER — PALONOSETRON HCL INJECTION 0.25 MG/5ML
0.2500 mg | Freq: Once | INTRAVENOUS | Status: AC
  Administered 2017-12-01: 0.25 mg via INTRAVENOUS

## 2017-12-01 MED ORDER — HYDROCODONE-ACETAMINOPHEN 10-325 MG PO TABS: 1.0000 | ORAL_TABLET | ORAL | 0 refills | Status: DC | PRN

## 2017-12-01 MED ORDER — SODIUM CHLORIDE 0.9 % IV SOLN
24.5000 mg/m2 | Freq: Once | INTRAVENOUS | Status: AC
  Administered 2017-12-01: 50 mg via INTRAVENOUS
  Filled 2017-12-01: qty 50

## 2017-12-01 MED ORDER — SODIUM CHLORIDE 0.9 % IV SOLN
Freq: Once | INTRAVENOUS | Status: AC
  Administered 2017-12-01: 09:00:00 via INTRAVENOUS

## 2017-12-01 MED ORDER — FOSAPREPITANT DIMEGLUMINE INJECTION 150 MG
Freq: Once | INTRAVENOUS | Status: AC
  Administered 2017-12-01: 12:00:00 via INTRAVENOUS
  Filled 2017-12-01: qty 5

## 2017-12-01 MED ORDER — PALONOSETRON HCL INJECTION 0.25 MG/5ML
INTRAVENOUS | Status: AC
  Filled 2017-12-01: qty 5

## 2017-12-01 MED ORDER — SODIUM CHLORIDE 0.9% FLUSH
10.0000 mL | INTRAVENOUS | Status: DC | PRN
  Administered 2017-12-01: 10 mL
  Filled 2017-12-01: qty 10

## 2017-12-01 MED ORDER — SODIUM CHLORIDE 0.9 % IV SOLN
500.0000 mg/m2 | Freq: Once | INTRAVENOUS | Status: AC
  Administered 2017-12-01: 1026 mg via INTRAVENOUS
  Filled 2017-12-01: qty 26.98

## 2017-12-01 MED ORDER — POTASSIUM CHLORIDE 2 MEQ/ML IV SOLN
Freq: Once | INTRAVENOUS | Status: AC
  Administered 2017-12-01: 11:00:00 via INTRAVENOUS
  Filled 2017-12-01: qty 10

## 2017-12-01 MED ORDER — HEPARIN SOD (PORK) LOCK FLUSH 100 UNIT/ML IV SOLN
500.0000 [IU] | Freq: Once | INTRAVENOUS | Status: AC | PRN
  Administered 2017-12-01: 500 [IU]
  Filled 2017-12-01: qty 5

## 2017-12-01 NOTE — Patient Instructions (Signed)
[facility] Instructions for Patients Receiving Chemotherapy  Today you received the following chemotherapy agents:  Gemzar & Cisplatin To help prevent nausea and vomiting after your treatment, we encourage you to take your nausea medication as prescribed.   If you develop nausea and vomiting that is not controlled by your nausea medication, call the clinic.   BELOW ARE SYMPTOMS THAT SHOULD BE REPORTED IMMEDIATELY:  *FEVER GREATER THAN 100.5 F  *CHILLS WITH OR WITHOUT FEVER  NAUSEA AND VOMITING THAT IS NOT CONTROLLED WITH YOUR NAUSEA MEDICATION  *UNUSUAL SHORTNESS OF BREATH  *UNUSUAL BRUISING OR BLEEDING  TENDERNESS IN MOUTH AND THROAT WITH OR WITHOUT PRESENCE OF ULCERS  *URINARY PROBLEMS  *BOWEL PROBLEMS  UNUSUAL RASH Items with * indicate a potential emergency and should be followed up as soon as possible.  Feel free to call the clinic should you have any questions or concerns. The clinic phone number is [phone].  Please show the [facility] at check-in to the Emergency Department and triage nurse.  Cisplatin injection What is this medicine? CISPLATIN (SIS pla tin) is a chemotherapy drug. It targets fast dividing cells, like cancer cells, and causes these cells to die. This medicine is used to treat many types of cancer like bladder, ovarian, and testicular cancers. This medicine may be used for other purposes; ask your health care provider or pharmacist if you have questions. COMMON BRAND NAME(S): Platinol, Platinol -AQ What should I tell my health care provider before I take this medicine? They need to know if you have any of these conditions: -blood disorders -hearing problems -kidney disease -recent or ongoing radiation therapy -an unusual or allergic reaction to cisplatin, carboplatin, other chemotherapy, other medicines, foods, dyes, or preservatives -pregnant or trying to get pregnant -breast-feeding How should I use this  medicine? This drug is given as an infusion into a vein. It is administered in a hospital or clinic by a specially trained health care professional. Talk to your pediatrician regarding the use of this medicine in children. Special care may be needed. Overdosage: If you think you have taken too much of this medicine contact a poison control center or emergency room at once. NOTE: This medicine is only for you. Do not share this medicine with others. What if I miss a dose? It is important not to miss a dose. Call your doctor or health care professional if you are unable to keep an appointment. What may interact with this medicine? -dofetilide -foscarnet -medicines for seizures -medicines to increase blood counts like filgrastim, pegfilgrastim, sargramostim -probenecid -pyridoxine used with altretamine -rituximab -some antibiotics like amikacin, gentamicin, neomycin, polymyxin B, streptomycin, tobramycin -sulfinpyrazone -vaccines -zalcitabine Talk to your doctor or health care professional before taking any of these medicines: -acetaminophen -aspirin -ibuprofen -ketoprofen -naproxen This list may not describe all possible interactions. Give your health care provider a list of all the medicines, herbs, non-prescription drugs, or dietary supplements you use. Also tell them if you smoke, drink alcohol, or use illegal drugs. Some items may interact with your medicine. What should I watch for while using this medicine? Your condition will be monitored carefully while you are receiving this medicine. You will need important blood work done while you are taking this medicine. This drug may make you feel generally unwell. This is not uncommon, as chemotherapy can affect healthy cells as well as cancer cells. Report any side effects. Continue your course of treatment even though you feel ill unless your doctor tells you  to stop. In some cases, you may be given additional medicines to help with side  effects. Follow all directions for their use. Call your doctor or health care professional for advice if you get a fever, chills or sore throat, or other symptoms of a cold or flu. Do not treat yourself. This drug decreases your body's ability to fight infections. Try to avoid being around people who are sick. This medicine may increase your risk to bruise or bleed. Call your doctor or health care professional if you notice any unusual bleeding. Be careful brushing and flossing your teeth or using a toothpick because you may get an infection or bleed more easily. If you have any dental work done, tell your dentist you are receiving this medicine. Avoid taking products that contain aspirin, acetaminophen, ibuprofen, naproxen, or ketoprofen unless instructed by your doctor. These medicines may hide a fever. Do not become pregnant while taking this medicine. Women should inform their doctor if they wish to become pregnant or think they might be pregnant. There is a potential for serious side effects to an unborn child. Talk to your health care professional or pharmacist for more information. Do not breast-feed an infant while taking this medicine. Drink fluids as directed while you are taking this medicine. This will help protect your kidneys. Call your doctor or health care professional if you get diarrhea. Do not treat yourself. What side effects may I notice from receiving this medicine? Side effects that you should report to your doctor or health care professional as soon as possible: -allergic reactions like skin rash, itching or hives, swelling of the face, lips, or tongue -signs of infection - fever or chills, cough, sore throat, pain or difficulty passing urine -signs of decreased platelets or bleeding - bruising, pinpoint red spots on the skin, black, tarry stools, nosebleeds -signs of decreased red blood cells - unusually weak or tired, fainting spells, lightheadedness -breathing  problems -changes in hearing -gout pain -low blood counts - This drug may decrease the number of white blood cells, red blood cells and platelets. You may be at increased risk for infections and bleeding. -nausea and vomiting -pain, swelling, redness or irritation at the injection site -pain, tingling, numbness in the hands or feet -problems with balance, movement -trouble passing urine or change in the amount of urine Side effects that usually do not require medical attention (report to your doctor or health care professional if they continue or are bothersome): -changes in vision -loss of appetite -metallic taste in the mouth or changes in taste This list may not describe all possible side effects. Call your doctor for medical advice about side effects. You may report side effects to FDA at 1-800-FDA-1088. Where should I keep my medicine? This drug is given in a hospital or clinic and will not be stored at home. NOTE: This sheet is a summary. It may not cover all possible information. If you have questions about this medicine, talk to your doctor, pharmacist, or health care provider.  2018 [employer]/Gold Standard (2008-01-25 14:40:54)

## 2017-12-09 ENCOUNTER — Other Ambulatory Visit: Payer: Self-pay

## 2017-12-09 ENCOUNTER — Inpatient Hospital Stay: Payer: BC Managed Care – PPO

## 2017-12-09 ENCOUNTER — Other Ambulatory Visit: Payer: Self-pay | Admitting: Hematology and Oncology

## 2017-12-09 ENCOUNTER — Inpatient Hospital Stay: Payer: BC Managed Care – PPO | Attending: Hematology and Oncology

## 2017-12-09 VITALS — BP 135/60 | HR 84 | Temp 98.2°F | Resp 18

## 2017-12-09 DIAGNOSIS — Z5111 Encounter for antineoplastic chemotherapy: Secondary | ICD-10-CM | POA: Insufficient documentation

## 2017-12-09 DIAGNOSIS — C481 Malignant neoplasm of specified parts of peritoneum: Secondary | ICD-10-CM

## 2017-12-09 DIAGNOSIS — C786 Secondary malignant neoplasm of retroperitoneum and peritoneum: Secondary | ICD-10-CM | POA: Diagnosis not present

## 2017-12-09 DIAGNOSIS — C569 Malignant neoplasm of unspecified ovary: Secondary | ICD-10-CM

## 2017-12-09 DIAGNOSIS — E1140 Type 2 diabetes mellitus with diabetic neuropathy, unspecified: Secondary | ICD-10-CM | POA: Insufficient documentation

## 2017-12-09 DIAGNOSIS — Z79899 Other long term (current) drug therapy: Secondary | ICD-10-CM | POA: Insufficient documentation

## 2017-12-09 DIAGNOSIS — C549 Malignant neoplasm of corpus uteri, unspecified: Secondary | ICD-10-CM | POA: Insufficient documentation

## 2017-12-09 DIAGNOSIS — T451X5S Adverse effect of antineoplastic and immunosuppressive drugs, sequela: Secondary | ICD-10-CM | POA: Diagnosis not present

## 2017-12-09 DIAGNOSIS — D6481 Anemia due to antineoplastic chemotherapy: Secondary | ICD-10-CM

## 2017-12-09 DIAGNOSIS — I2510 Atherosclerotic heart disease of native coronary artery without angina pectoris: Secondary | ICD-10-CM | POA: Insufficient documentation

## 2017-12-09 DIAGNOSIS — I700 Atherosclerosis of aorta: Secondary | ICD-10-CM | POA: Diagnosis not present

## 2017-12-09 DIAGNOSIS — D61810 Antineoplastic chemotherapy induced pancytopenia: Secondary | ICD-10-CM | POA: Insufficient documentation

## 2017-12-09 DIAGNOSIS — T451X5A Adverse effect of antineoplastic and immunosuppressive drugs, initial encounter: Secondary | ICD-10-CM

## 2017-12-09 DIAGNOSIS — Z9641 Presence of insulin pump (external) (internal): Secondary | ICD-10-CM | POA: Insufficient documentation

## 2017-12-09 DIAGNOSIS — C548 Malignant neoplasm of overlapping sites of corpus uteri: Secondary | ICD-10-CM | POA: Diagnosis present

## 2017-12-09 DIAGNOSIS — E538 Deficiency of other specified B group vitamins: Secondary | ICD-10-CM

## 2017-12-09 DIAGNOSIS — G620 Drug-induced polyneuropathy: Secondary | ICD-10-CM | POA: Insufficient documentation

## 2017-12-09 DIAGNOSIS — Z794 Long term (current) use of insulin: Secondary | ICD-10-CM | POA: Diagnosis not present

## 2017-12-09 LAB — COMPREHENSIVE METABOLIC PANEL
ALBUMIN: 3.6 g/dL (ref 3.5–5.0)
ALT: 18 U/L (ref 0–55)
AST: 15 U/L (ref 5–34)
Alkaline Phosphatase: 118 U/L (ref 40–150)
Anion gap: 11 (ref 3–11)
BUN: 29 mg/dL — ABNORMAL HIGH (ref 7–26)
CO2: 26 mmol/L (ref 22–29)
CREATININE: 1.05 mg/dL (ref 0.60–1.10)
Calcium: 8.8 mg/dL (ref 8.4–10.4)
Chloride: 104 mmol/L (ref 98–109)
GFR calc Af Amer: >60 mL/min (ref >60)
GFR calc non Af Amer: 55 mL/min — ABNORMAL LOW (ref >60)
GLUCOSE: 170 mg/dL — AB (ref 70–140)
Potassium: 4.7 mmol/L (ref 3.5–5.1)
SODIUM: 141 mmol/L (ref 136–145)
Total Bilirubin: 0.3 mg/dL (ref 0.2–1.2)
Total Protein: 6.7 g/dL (ref 6.4–8.3)

## 2017-12-09 LAB — CBC WITH DIFFERENTIAL/PLATELET
BASOS PCT: 1 %
Basophils Absolute: 0 10*3/uL (ref 0.0–0.1)
EOS ABS: 0.1 10*3/uL (ref 0.0–0.5)
Eosinophils Relative: 4 %
HCT: 27.8 % — ABNORMAL LOW (ref 34.8–46.6)
Hemoglobin: 9.3 g/dL — ABNORMAL LOW (ref 11.6–15.9)
Lymphocytes Relative: 8 %
Lymphs Abs: 0.3 10*3/uL — ABNORMAL LOW (ref 0.9–3.3)
MCH: 33.6 pg (ref 25.1–34.0)
MCHC: 33.2 g/dL (ref 31.5–36.0)
MCV: 101.2 fL — ABNORMAL HIGH (ref 79.5–101.0)
MONO ABS: 0.4 10*3/uL (ref 0.1–0.9)
MONOS PCT: 12 %
Neutro Abs: 2.6 10*3/uL (ref 1.5–6.5)
Neutrophils Relative %: 75 %
Platelets: 105 10*3/uL — ABNORMAL LOW (ref 145–400)
RBC: 2.75 MIL/uL — ABNORMAL LOW (ref 3.70–5.45)
RDW: 20 % — AB (ref 11.2–14.5)
WBC: 3.4 10*3/uL — ABNORMAL LOW (ref 3.9–10.3)

## 2017-12-09 LAB — SAMPLE TO BLOOD BANK

## 2017-12-09 MED ORDER — PEGFILGRASTIM 6 MG/0.6ML ~~LOC~~ PSKT
PREFILLED_SYRINGE | SUBCUTANEOUS | Status: AC
  Filled 2017-12-09: qty 0.6

## 2017-12-09 MED ORDER — SODIUM CHLORIDE 0.9% FLUSH
10.0000 mL | INTRAVENOUS | Status: DC | PRN
  Administered 2017-12-09: 10 mL
  Filled 2017-12-09: qty 10

## 2017-12-09 MED ORDER — SODIUM CHLORIDE 0.9 % IV SOLN
Freq: Once | INTRAVENOUS | Status: AC
  Administered 2017-12-09: 12:00:00 via INTRAVENOUS
  Filled 2017-12-09: qty 5

## 2017-12-09 MED ORDER — LIDOCAINE-PRILOCAINE 2.5-2.5 % EX CREA: TOPICAL_CREAM | CUTANEOUS | 3 refills | Status: AC

## 2017-12-09 MED ORDER — HEPARIN SOD (PORK) LOCK FLUSH 100 UNIT/ML IV SOLN
500.0000 [IU] | Freq: Once | INTRAVENOUS | Status: AC | PRN
  Administered 2017-12-09: 500 [IU]
  Filled 2017-12-09: qty 5

## 2017-12-09 MED ORDER — SODIUM CHLORIDE 0.9 % IV SOLN
24.5000 mg/m2 | Freq: Once | INTRAVENOUS | Status: AC
  Administered 2017-12-09: 50 mg via INTRAVENOUS
  Filled 2017-12-09: qty 50

## 2017-12-09 MED ORDER — POTASSIUM CHLORIDE 2 MEQ/ML IV SOLN
Freq: Once | INTRAVENOUS | Status: AC
  Administered 2017-12-09: 10:00:00 via INTRAVENOUS
  Filled 2017-12-09: qty 10

## 2017-12-09 MED ORDER — SODIUM CHLORIDE 0.9 % IV SOLN
500.0000 mg/m2 | Freq: Once | INTRAVENOUS | Status: AC
  Administered 2017-12-09: 1026 mg via INTRAVENOUS
  Filled 2017-12-09: qty 26.98

## 2017-12-09 MED ORDER — PEGFILGRASTIM 6 MG/0.6ML ~~LOC~~ PSKT
6.0000 mg | PREFILLED_SYRINGE | Freq: Once | SUBCUTANEOUS | Status: AC
  Administered 2017-12-09: 6 mg via SUBCUTANEOUS

## 2017-12-09 MED ORDER — SODIUM CHLORIDE 0.9% FLUSH
10.0000 mL | Freq: Once | INTRAVENOUS | Status: AC
  Administered 2017-12-09: 10 mL
  Filled 2017-12-09: qty 10

## 2017-12-09 MED ORDER — SODIUM CHLORIDE 0.9 % IV SOLN
Freq: Once | INTRAVENOUS | Status: AC
  Administered 2017-12-09: 12:00:00 via INTRAVENOUS

## 2017-12-09 MED ORDER — PALONOSETRON HCL INJECTION 0.25 MG/5ML
0.2500 mg | Freq: Once | INTRAVENOUS | Status: AC
  Administered 2017-12-09: 0.25 mg via INTRAVENOUS

## 2017-12-09 MED ORDER — PALONOSETRON HCL INJECTION 0.25 MG/5ML
INTRAVENOUS | Status: AC
  Filled 2017-12-09: qty 5

## 2017-12-09 NOTE — Patient Instructions (Signed)
[facility] Discharge Instructions for Patients Receiving Chemotherapy  Today you received the following chemotherapy agents Gemzar and Cisplatin  To help prevent nausea and vomiting after your treatment, we encourage you to take your nausea medication as directed   If you develop nausea and vomiting that is not controlled by your nausea medication, call the clinic.   BELOW ARE SYMPTOMS THAT SHOULD BE REPORTED IMMEDIATELY:  *FEVER GREATER THAN 100.5 F  *CHILLS WITH OR WITHOUT FEVER  NAUSEA AND VOMITING THAT IS NOT CONTROLLED WITH YOUR NAUSEA MEDICATION  *UNUSUAL SHORTNESS OF BREATH  *UNUSUAL BRUISING OR BLEEDING  TENDERNESS IN MOUTH AND THROAT WITH OR WITHOUT PRESENCE OF ULCERS  *URINARY PROBLEMS  *BOWEL PROBLEMS  UNUSUAL RASH Items with * indicate a potential emergency and should be followed up as soon as possible.  Feel free to call the clinic should you have any questions or concerns. The clinic phone number is [phone].  Please show the CHEMO ALERT CARD at check-in to the Emergency Department and triage nurse.   

## 2017-12-15 ENCOUNTER — Inpatient Hospital Stay (HOSPITAL_BASED_OUTPATIENT_CLINIC_OR_DEPARTMENT_OTHER): Payer: BC Managed Care – PPO | Admitting: Hematology and Oncology

## 2017-12-15 ENCOUNTER — Inpatient Hospital Stay: Payer: BC Managed Care – PPO

## 2017-12-15 ENCOUNTER — Telehealth: Payer: Self-pay | Admitting: Hematology and Oncology

## 2017-12-15 ENCOUNTER — Encounter: Payer: Self-pay | Admitting: Hematology and Oncology

## 2017-12-15 DIAGNOSIS — C549 Malignant neoplasm of corpus uteri, unspecified: Secondary | ICD-10-CM | POA: Diagnosis not present

## 2017-12-15 DIAGNOSIS — E1140 Type 2 diabetes mellitus with diabetic neuropathy, unspecified: Secondary | ICD-10-CM

## 2017-12-15 DIAGNOSIS — Z794 Long term (current) use of insulin: Secondary | ICD-10-CM | POA: Diagnosis not present

## 2017-12-15 DIAGNOSIS — G620 Drug-induced polyneuropathy: Secondary | ICD-10-CM

## 2017-12-15 DIAGNOSIS — D6481 Anemia due to antineoplastic chemotherapy: Secondary | ICD-10-CM

## 2017-12-15 DIAGNOSIS — C481 Malignant neoplasm of specified parts of peritoneum: Secondary | ICD-10-CM

## 2017-12-15 DIAGNOSIS — C786 Secondary malignant neoplasm of retroperitoneum and peritoneum: Secondary | ICD-10-CM | POA: Diagnosis not present

## 2017-12-15 DIAGNOSIS — T451X5S Adverse effect of antineoplastic and immunosuppressive drugs, sequela: Secondary | ICD-10-CM

## 2017-12-15 DIAGNOSIS — T451X5A Adverse effect of antineoplastic and immunosuppressive drugs, initial encounter: Secondary | ICD-10-CM

## 2017-12-15 DIAGNOSIS — Z79899 Other long term (current) drug therapy: Secondary | ICD-10-CM | POA: Diagnosis not present

## 2017-12-15 DIAGNOSIS — C569 Malignant neoplasm of unspecified ovary: Secondary | ICD-10-CM | POA: Diagnosis not present

## 2017-12-15 DIAGNOSIS — D61810 Antineoplastic chemotherapy induced pancytopenia: Secondary | ICD-10-CM | POA: Diagnosis not present

## 2017-12-15 DIAGNOSIS — E538 Deficiency of other specified B group vitamins: Secondary | ICD-10-CM

## 2017-12-15 LAB — CBC WITH DIFFERENTIAL/PLATELET
Basophils Absolute: 0 10*3/uL (ref 0.0–0.1)
Basophils Relative: 0 %
EOS ABS: 0.1 10*3/uL (ref 0.0–0.5)
EOS PCT: 1 %
HCT: 27.3 % — ABNORMAL LOW (ref 34.8–46.6)
Hemoglobin: 8.8 g/dL — ABNORMAL LOW (ref 11.6–15.9)
LYMPHS ABS: 0.4 10*3/uL — AB (ref 0.9–3.3)
LYMPHS PCT: 4 %
MCH: 33.6 pg (ref 25.1–34.0)
MCHC: 32.2 g/dL (ref 31.5–36.0)
MCV: 104.2 fL — AB (ref 79.5–101.0)
MONO ABS: 0.9 10*3/uL (ref 0.1–0.9)
MONOS PCT: 8 %
Neutro Abs: 9.9 10*3/uL — ABNORMAL HIGH (ref 1.5–6.5)
Neutrophils Relative %: 87 %
PLATELETS: 32 10*3/uL — AB (ref 145–400)
RBC: 2.62 MIL/uL — AB (ref 3.70–5.45)
RDW: 18.7 % — ABNORMAL HIGH (ref 11.2–14.5)
WBC: 11.3 10*3/uL — AB (ref 3.9–10.3)

## 2017-12-15 LAB — COMPREHENSIVE METABOLIC PANEL
ALT: 14 U/L (ref 0–55)
ANION GAP: 12 — AB (ref 3–11)
AST: 12 U/L (ref 5–34)
Albumin: 3.6 g/dL (ref 3.5–5.0)
Alkaline Phosphatase: 141 U/L (ref 40–150)
BUN: 37 mg/dL — ABNORMAL HIGH (ref 7–26)
CHLORIDE: 104 mmol/L (ref 98–109)
CO2: 25 mmol/L (ref 22–29)
CREATININE: 1.17 mg/dL — AB (ref 0.60–1.10)
Calcium: 8.8 mg/dL (ref 8.4–10.4)
GFR, EST AFRICAN AMERICAN: 56 mL/min — AB (ref >60)
GFR, EST NON AFRICAN AMERICAN: 48 mL/min — AB (ref >60)
Glucose, Bld: 134 mg/dL (ref 70–140)
Potassium: 4.6 mmol/L (ref 3.5–5.1)
Sodium: 141 mmol/L (ref 136–145)
Total Bilirubin: 0.6 mg/dL (ref 0.2–1.2)
Total Protein: 6.4 g/dL (ref 6.4–8.3)

## 2017-12-15 LAB — SAMPLE TO BLOOD BANK

## 2017-12-15 MED ORDER — SODIUM CHLORIDE 0.9% FLUSH
10.0000 mL | Freq: Once | INTRAVENOUS | Status: AC
  Administered 2017-12-15: 10 mL
  Filled 2017-12-15: qty 10

## 2017-12-15 NOTE — Progress Notes (Signed)
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 This may potentially be ovarian or a GI in etiology.      04/30/2016 Tumor Marker    CA 125- 7149.0 (H)      05/01/2016 Procedure    US paracentesis- Successful ultrasound-guided paracentesis yielding 1.8 liters of peritoneal fluid.      05/01/2016 Imaging    US pelvis- Both transabdominal and transvaginal sonography are significantly limited by large patient habitus and ascites. Neither uterus or ovaries were visualized on this exam.      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      05/08/2016 Imaging    CT chest- No evidence of metastatic disease in the chest. Peritoneal/omental disease with abdominal ascites in the upper abdomen, incompletely visualized.       05/13/2016 Procedure    Placement of single lumen port a cath via right internal jugular vein. The catheter tip lies at the cavoatrial junction. A power injectable port a cath was placed and is ready for immediate use.      05/15/2016 Procedure    US Paracentesis- 3400 ml yellow colored ascites removed      05/15/2016 - 09/18/2016 Chemotherapy    Carboplatin/Paclitaxel every 21 days x 7 cycles      07/01/2016 Miscellaneous    Genetic Counseling by [name]-  Genetic testing was normal, and did not reveal a deleterious mutation in these genes.       07/08/2016 Imaging    CT CAP- 1. Small volume ascites, significantly  decreased. 2. Stable diffuse omental soft tissue caking and diffuse peritoneal thickening along the bilateral paracolic gutters and bilateral pelvic peritoneal reflections, consistent with peritoneal carcinomatosis. 3. Stable asymmetrically enlarged right ovary, which may represent the primary site of ovarian malignancy. 4. No evidence of metastatic disease in the chest. No new sites of metastatic disease in the abdomen or pelvis.      07/09/2016 Miscellaneous    Gyn Onc re-evaluation- modest response to therapy, 3 more cycles of chemotherapy recommended.        09/11/2016 Imaging    CT C/A/P No significant change omental soft tissue caking, consistent with metastatic disease. Mild ascites is decreased since previous study.  Increased calcification along peritoneal surface in pelvic cul-de-sac, consistent with treated peritoneal metastatic disease.  Stable 4.5cm homogeneous right pelvic mass, which favors a uterine fibroid although right ovarian neoplasm cannot definitely be excluded.  No new or progressive metastatic disease identified. No evidence of metastatic disease within the thorax.       10/14/2016 Procedure    Robotic-assisted laparoscopic total hysterectomy with bilateral salpingoophorectomy, ex lap omentectomy, radical tumor debulking by Dr. Denman George      10/17/2016 Pathology Results    Diagnosis 1. 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 - CERVIX AND ENDOMETRIUM FREE OF TUMOR. - SEE ONCOLOGY TABLE AND COMMENT. 2. Soft tissue, biopsy, umbilical nodule - HIGH GRADE SEROUS CARCINOMA. 3.  Omentum, resection for tumor - HIGH GRADE SEROUS CARCINOMA, 33 CM.      11/06/2016 - 12/25/2016 Chemotherapy    Carboplatin/Paclitaxel x 3 cycles       01/12/2017 Imaging    CT CAP- 1. Interval hysterectomy, bilateral salpingo-oophorectomy and omentectomy without evidence of  tumor recurrence. 2. No evidence of metastatic disease. 3. 5 mm nonobstructing lower pole left renal calculus.      01/13/2017 Remission    No evidence of residual disease on CT imaging.      05/04/2017 Imaging    CT CAP- New small amount of ascites within the abdomen and pelvis since 01/12/2017 which could indicate disease progression but no new identifiable tumor and no significant change in omental and mild peritoneal thickening.  No evidence of metastatic disease within the chest.  Coronary artery disease.  Aortic Atherosclerosis (ICD10-I70.0).      05/04/2017 Tumor Marker    Patient's tumor was tested for the following markers: CA 125 Results of the tumor marker test revealed 4818      05/18/2017 Imaging    ECHO; EF 60% -  65      05/25/2017 - 07/07/2017 Chemotherapy    She received Doxil and Avastin. Treatment is stopped due to disease progression      06/01/2017 Procedure    Successful ultrasound-guided therapeutic paracentesis yielding 2.8 liters of peritoneal fluid      06/22/2017 Tumor Marker    Patient's tumor was tested for the following markers: CA 125 Results of the tumor marker test revealed 13440      07/20/2017 Imaging    Mild increase in peritoneal carcinoma within abdomen pelvis since previous study. No significant change and minimal ascites.  No evidence of metastatic disease within the thorax. New mild airspace opacity in left lower lobe, consistent with inflammatory or infectious etiology.      07/30/2017 Tumor Marker    Patient's tumor was tested for the following markers: CA 125 Results of the tumor marker test revealed 12099      07/30/2017 -  Chemotherapy    The patient had gemzar. Cisplatin is added on 11/02/18      08/03/2017 - 08/05/2017 Hospital Admission    She was admitted to the hospital for management of UTI and neutropenic fever      08/13/2017 Adverse Reaction    Dose of chemotherapy is reduced due to neutropenic sepsis       08/24/2017 Tumor Marker    Patient's tumor was tested for the following markers: CA 125 Results of the tumor marker test revealed 7431      09/21/2017 Tumor Marker    Patient's tumor was tested for the following markers: CA 125 Results of the tumor marker test revealed 4176      10/09/2017 Imaging    No significant change in peritoneal carcinomatosis since previous study.  No new or progressive metastatic disease within the abdomen or pelvis.  Stable tiny nonobstructive left renal calculus. No evidence of ureteral calculi or hydronephrosis.      11/10/2017 Tumor Marker    Patient's tumor was tested for the following markers: CA 125 Results of the tumor marker test revealed 4257      11/24/2017 Tumor Marker    Patient's tumor was tested for the following markers: CA 125 Results of the tumor marker test revealed [id number]       INTERVAL HISTORY: Please see below for problem oriented charting. She returns with her husband for further follow-up She complained of fatigue  No recent fever or chills The patient denies any recent signs or symptoms of bleeding such as spontaneous epistaxis, hematuria or hematochezia. Her blood sugar bump to over 400 range the day after chemotherapy, resolved with additional insulin treatment Overall, she denies excessive nausea, vomiting or changes in bowel habits She denies worsening peripheral neuropathy  REVIEW OF SYSTEMS:   Constitutional: Denies fevers, chills or abnormal weight loss Eyes: Denies blurriness of vision Ears, nose, mouth, throat, and face: Denies mucositis or sore throat Respiratory: Denies cough, dyspnea or wheezes Cardiovascular: Denies palpitation, chest discomfort or lower extremity swelling Gastrointestinal:  Denies nausea, heartburn or change in bowel habits Skin: Denies abnormal skin rashes Lymphatics: Denies new lymphadenopathy or easy bruising Neurological:Denies numbness, tingling or new weaknesses Behavioral/Psych: Mood is  stable, no new changes  All other systems were reviewed with the patient and are negative.  I have reviewed the past medical history, past surgical history, social history and family history with the patient and they are unchanged from previous note.  ALLERGIES:  is allergic to avandia [rosiglitazone]; micronase [glyburide]; and actos [pioglitazone].  MEDICATIONS:  Current Outpatient Medications  Medication Sig Dispense Refill  . atorvastatin (LIPITOR) 40 MG tablet TAKE 1 TABLET(40 MG) BY MOUTH DAILY 30 tablet 0  . benazepril (LOTENSIN) 10 MG tablet Take 10 mg by mouth daily.    . furosemide (LASIX) 20 MG tablet Take 1 tablet (20 mg total) by mouth 3 (three) times daily. 90 tablet 0  . gabapentin (NEURONTIN) 300 MG capsule Take 1 capsule (300 mg total) by mouth 3 (three) times daily. 90 capsule 11  . HYDROcodone-acetaminophen (NORCO) 10-325 MG tablet Take 1 tablet by mouth every 4 (four) hours as needed. 90 tablet 0  . insulin aspart (NOVOLOG) 100 UNIT/ML injection 90 units into insulin pump daily 100 mL 0  . lidocaine-prilocaine (EMLA) cream Apply a quarter size amount to port site 1 hour prior to chemo. Do not rub in. Cover with plastic wrap. 30 g 3  . loperamide (IMODIUM A-D) 2 MG tablet Take 1 tablet (2 mg total) by mouth 4 (four) times daily as needed for diarrhea or loose stools. 30 tablet 0  . metFORMIN (GLUCOPHAGE) 1000 MG tablet TAKE 1 TABLET BY MOUTH TWICE DAILY WITH A MEAL 180 tablet 1  . Multiple Vitamin (MULTIVITAMIN WITH MINERALS) TABS Take 1 tablet by mouth daily.    . multivitamin-lutein (OCUVITE-LUTEIN) CAPS capsule Take 1 capsule by mouth daily.    Marland Kitchen NOVOLOG 100 UNIT/ML injection USE IN INSULIN PUMP AS DIRECTED. MAX DAILY DOSE OF 110 UNITS PER DAY 120 mL 0  . omeprazole (PRILOSEC) 40 MG capsule Take 1 capsule (40 mg total) daily by mouth. 90 capsule 3  . ondansetron (ZOFRAN) 8 MG tablet Take 1 tablet (8 mg total) by mouth every 8 (eight) hours as needed for nausea or  vomiting. 90 tablet 3  . ONE TOUCH ULTRA TEST test strip USE TO CHECK BLOOD SUGAR UP TO 5 TIMES A DAY 450 each 0  . polyethylene glycol (MIRALAX / GLYCOLAX) packet Take 17 g by mouth daily as needed for mild constipation.     . prochlorperazine (COMPAZINE) 10 MG tablet Take 1 tablet (10 mg total) by mouth every 6 (six) hours as needed for nausea or vomiting. 30 tablet 0  . senna (SENOKOT) 8.6 MG TABS tablet Take 1 tablet (8.6 mg total) by mouth at bedtime as needed for moderate constipation. 120 each 0  . vitamin B-12 (CYANOCOBALAMIN) 1000 MCG tablet Take 1,000  mcg by mouth daily.    . vitamin E (VITAMIN E) 400 UNIT capsule Take 400 Units by mouth daily.     No current facility-administered medications for this visit.     PHYSICAL EXAMINATION: ECOG PERFORMANCE STATUS: 1 - Symptomatic but completely ambulatory  Vitals:   12/15/17 0844  BP: (!) 149/58  Pulse: (!) 102  Resp: 18  Temp: 98.4 F (36.9 C)  SpO2: 95%   Filed Weights   12/15/17 0844  Weight: 217 lb (98.4 kg)    GENERAL:alert, no distress and comfortable.  She is morbidly obese SKIN: skin color, texture, turgor are normal, no rashes or significant lesions EYES: normal, Conjunctiva are pink and non-injected, sclera clear OROPHARYNX:no exudate, no erythema and lips, buccal mucosa, and tongue normal  NECK: supple, thyroid normal size, non-tender, without nodularity LYMPH:  no palpable lymphadenopathy in the cervical, axillary or inguinal LUNGS: clear to auscultation and percussion with normal breathing effort HEART: regular rate & rhythm and no murmurs and no lower extremity edema ABDOMEN:abdomen soft, non-tender and normal bowel sounds Musculoskeletal:no cyanosis of digits and no clubbing  NEURO: alert & oriented x 3 with fluent speech, no focal motor/sensory deficits  LABORATORY DATA:  I have reviewed the data as listed    Component Value Date/Time   NA 141 12/15/2017 0750   NA 139 11/02/2017 0756   K 4.6  12/15/2017 0750   K 4.7 11/02/2017 0756   CL 104 12/15/2017 0750   CO2 25 12/15/2017 0750   CO2 26 11/02/2017 0756   GLUCOSE 134 12/15/2017 0750   GLUCOSE 140 11/02/2017 0756   BUN 37 (H) 12/15/2017 0750   BUN 23.7 11/02/2017 0756   CREATININE 1.17 (H) 12/15/2017 0750   CREATININE 0.9 11/02/2017 0756   CALCIUM 8.8 12/15/2017 0750   CALCIUM 9.4 11/02/2017 0756   PROT 6.4 12/15/2017 0750   PROT 7.0 11/02/2017 0756   ALBUMIN 3.6 12/15/2017 0750   ALBUMIN 3.5 11/02/2017 0756   AST 12 12/15/2017 0750   AST 18 11/02/2017 0756   ALT 14 12/15/2017 0750   ALT 23 11/02/2017 0756   ALKPHOS 141 12/15/2017 0750   ALKPHOS 108 11/02/2017 0756   BILITOT 0.6 12/15/2017 0750   BILITOT 0.24 11/02/2017 0756   GFRNONAA 48 (L) 12/15/2017 0750   GFRAA 56 (L) 12/15/2017 0750    No results found for: SPEP, UPEP  Lab Results  Component Value Date   WBC 11.3 (H) 12/15/2017   NEUTROABS 9.9 (H) 12/15/2017   HGB 8.8 (L) 12/15/2017   HCT 27.3 (L) 12/15/2017   MCV 104.2 (H) 12/15/2017   PLT 32 (L) 12/15/2017      Chemistry      Component Value Date/Time   NA 141 12/15/2017 0750   NA 139 11/02/2017 0756   K 4.6 12/15/2017 0750   K 4.7 11/02/2017 0756   CL 104 12/15/2017 0750   CO2 25 12/15/2017 0750   CO2 26 11/02/2017 0756   BUN 37 (H) 12/15/2017 0750   BUN 23.7 11/02/2017 0756   CREATININE 1.17 (H) 12/15/2017 0750   CREATININE 0.9 11/02/2017 0756      Component Value Date/Time   CALCIUM 8.8 12/15/2017 0750   CALCIUM 9.4 11/02/2017 0756   ALKPHOS 141 12/15/2017 0750   ALKPHOS 108 11/02/2017 0756   AST 12 12/15/2017 0750   AST 18 11/02/2017 0756   ALT 14 12/15/2017 0750   ALT 23 11/02/2017 0756   BILITOT 0.6 12/15/2017 0750   BILITOT 0.24 11/02/2017  0756       ASSESSMENT & PLAN:  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[place]) She is profoundly pancytopenic from recent chemotherapy She will continue on reduced dose chemotherapy I plan on giving her 6 doses of chemo before  repeat imaging Her most recent tumor markers showed positive response to treatment  Antineoplastic chemotherapy induced pancytopenia ([place]) She is profoundly pancytopenic from treatment She does not need blood transfusion today I warned her about risk of bleeding with thrombocytopenia I recommend return visit in 3 days' time for blood count check just in case she need transfusion support She is educated to watch out for signs and symptoms of bleeding She will continue G-CSF support with each cycle of treatment  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  All questions were answered. The patient knows to call the clinic with any problems, questions or concerns. No barriers to learning was detected. I spent 15 minutes counseling the patient face to face. The total time spent in the appointment was 20 minutes and more than 50% was on counseling and review of test results     Heath Lark, MD 12/15/2017 10:14 AM

## 2017-12-15 NOTE — Assessment & Plan Note (Signed)
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

## 2017-12-15 NOTE — Assessment & Plan Note (Signed)
She is profoundly pancytopenic from treatment She does not need blood transfusion today I warned her about risk of bleeding with thrombocytopenia I recommend return visit in 3 days' time for blood count check just in case she need transfusion support She is educated to watch out for signs and symptoms of bleeding She will continue G-CSF support with each cycle of treatment

## 2017-12-15 NOTE — Telephone Encounter (Signed)
Patient declined AVS and calendar of upcoming appointments. Patient will receive update in MyChart.  °

## 2017-12-15 NOTE — Assessment & Plan Note (Signed)
She is profoundly pancytopenic from recent chemotherapy She will continue on reduced dose chemotherapy I plan on giving her 6 doses of chemo before repeat imaging Her most recent tumor markers showed positive response to treatment

## 2017-12-16 ENCOUNTER — Encounter: Payer: Self-pay | Admitting: Hematology and Oncology

## 2017-12-18 ENCOUNTER — Inpatient Hospital Stay: Payer: BC Managed Care – PPO

## 2017-12-18 ENCOUNTER — Other Ambulatory Visit: Payer: Self-pay | Admitting: *Deleted

## 2017-12-18 ENCOUNTER — Other Ambulatory Visit: Payer: Self-pay | Admitting: Hematology and Oncology

## 2017-12-18 ENCOUNTER — Telehealth: Payer: Self-pay | Admitting: Hematology and Oncology

## 2017-12-18 DIAGNOSIS — D6481 Anemia due to antineoplastic chemotherapy: Secondary | ICD-10-CM

## 2017-12-18 DIAGNOSIS — T451X5A Adverse effect of antineoplastic and immunosuppressive drugs, initial encounter: Principal | ICD-10-CM

## 2017-12-18 DIAGNOSIS — C481 Malignant neoplasm of specified parts of peritoneum: Secondary | ICD-10-CM

## 2017-12-18 DIAGNOSIS — D61810 Antineoplastic chemotherapy induced pancytopenia: Secondary | ICD-10-CM

## 2017-12-18 DIAGNOSIS — C549 Malignant neoplasm of corpus uteri, unspecified: Secondary | ICD-10-CM | POA: Diagnosis not present

## 2017-12-18 LAB — COMPREHENSIVE METABOLIC PANEL
ALT: 14 U/L (ref 0–55)
ANION GAP: 13 — AB (ref 3–11)
AST: 16 U/L (ref 5–34)
Albumin: 3.7 g/dL (ref 3.5–5.0)
Alkaline Phosphatase: 136 U/L (ref 40–150)
BILIRUBIN TOTAL: 0.2 mg/dL (ref 0.2–1.2)
BUN: 32 mg/dL — ABNORMAL HIGH (ref 7–26)
CHLORIDE: 104 mmol/L (ref 98–109)
CO2: 24 mmol/L (ref 22–29)
Calcium: 9 mg/dL (ref 8.4–10.4)
Creatinine, Ser: 1.26 mg/dL — ABNORMAL HIGH (ref 0.60–1.10)
GFR calc Af Amer: 51 mL/min — ABNORMAL LOW (ref >60)
GFR, EST NON AFRICAN AMERICAN: 44 mL/min — AB (ref >60)
Glucose, Bld: 110 mg/dL (ref 70–140)
POTASSIUM: 4.5 mmol/L (ref 3.5–5.1)
Sodium: 141 mmol/L (ref 136–145)
TOTAL PROTEIN: 6.6 g/dL (ref 6.4–8.3)

## 2017-12-18 LAB — CBC WITH DIFFERENTIAL/PLATELET
Basophils Absolute: 0 10*3/uL (ref 0.0–0.1)
Basophils Relative: 0 %
EOS PCT: 2 %
Eosinophils Absolute: 0.1 10*3/uL (ref 0.0–0.5)
HEMATOCRIT: 26.4 % — AB (ref 34.8–46.6)
Hemoglobin: 8.4 g/dL — ABNORMAL LOW (ref 11.6–15.9)
Lymphocytes Relative: 6 %
Lymphs Abs: 0.5 10*3/uL — ABNORMAL LOW (ref 0.9–3.3)
MCH: 33.2 pg (ref 25.1–34.0)
MCHC: 31.8 g/dL (ref 31.5–36.0)
MCV: 104.3 fL — AB (ref 79.5–101.0)
MONO ABS: 1.2 10*3/uL — AB (ref 0.1–0.9)
MONOS PCT: 15 %
NEUTROS ABS: 6 10*3/uL (ref 1.5–6.5)
Neutrophils Relative %: 77 %
PLATELETS: 14 10*3/uL — AB (ref 145–400)
RBC: 2.53 MIL/uL — ABNORMAL LOW (ref 3.70–5.45)
RDW: 18.4 % — AB (ref 11.2–14.5)
WBC: 7.7 10*3/uL (ref 3.9–10.3)

## 2017-12-18 MED ORDER — DIPHENHYDRAMINE HCL 25 MG PO CAPS
ORAL_CAPSULE | ORAL | Status: AC
  Filled 2017-12-18: qty 1

## 2017-12-18 MED ORDER — SODIUM CHLORIDE 0.9% FLUSH
10.0000 mL | INTRAVENOUS | Status: AC | PRN
  Administered 2017-12-18: 10 mL
  Filled 2017-12-18: qty 10

## 2017-12-18 MED ORDER — ACETAMINOPHEN 325 MG PO TABS
650.0000 mg | ORAL_TABLET | Freq: Once | ORAL | Status: AC
  Administered 2017-12-18: 650 mg via ORAL

## 2017-12-18 MED ORDER — HEPARIN SOD (PORK) LOCK FLUSH 100 UNIT/ML IV SOLN
250.0000 [IU] | INTRAVENOUS | Status: AC | PRN
  Administered 2017-12-18: 500 [IU]
  Filled 2017-12-18: qty 5

## 2017-12-18 MED ORDER — SODIUM CHLORIDE 0.9 % IV SOLN
250.0000 mL | Freq: Once | INTRAVENOUS | Status: AC
  Administered 2017-12-18: 250 mL via INTRAVENOUS

## 2017-12-18 MED ORDER — ACETAMINOPHEN 325 MG PO TABS
ORAL_TABLET | ORAL | Status: AC
  Filled 2017-12-18: qty 2

## 2017-12-18 MED ORDER — DIPHENHYDRAMINE HCL 25 MG PO CAPS
25.0000 mg | ORAL_CAPSULE | Freq: Once | ORAL | Status: AC
  Administered 2017-12-18: 25 mg via ORAL

## 2017-12-18 NOTE — Telephone Encounter (Signed)
I have reviewed CBC with the patient today.  She has excessive bruising.  I recommend discontinuation of antiplatelet agents We discussed some of the risks, benefits, and alternatives of platelets transfusions. The patient is symptomatic from low platelet counts with bruising/bleeding/at high risk of life-threatening bleeding and the platelet count is critically low.  Some of the side-effects to be expected including risks of transfusion reactions, chills, infection, syndrome of volume overload and risk of hospitalization from various reasons and the patient is willing to proceed and went ahead to sign consent today. Recommend 1 unit of platelet transfusion She will return on Monday for repeat blood draw

## 2017-12-18 NOTE — Patient Instructions (Signed)
Platelet Transfusion A platelet transfusion is a procedure in which you receive donated platelets through an IV tube. Platelets are tiny pieces of blood cells. When a blood vessel is damaged, platelets collect in the damaged area to help form a blood clot. This begins the healing process. If your platelet count gets too low, your blood may have trouble clotting. You may need a platelet transfusion if you have a condition that causes a low number of platelets (thrombocytopenia). A platelet transfusion may be used to stop or prevent bleeding. Tell a health care provider about:  Any allergies you have.  All medicines you are taking, including vitamins, herbs, eye drops, creams, and over-the-counter medicines.  Any problems you or family members have had with anesthetic medicines.  Any blood disorders you have.  Any surgeries you have had.  Any medical conditions you have.  Any reactions you have had during a previous transfusion. What are the risks? Generally, this is a safe procedure. However, problems may occur, including:  Fever with or without chills. The fever usually occurs within the first 4 hours of the transfusion and returns to normal within 48 hours.  Allergic reaction. The reaction is most commonly caused by antibodies your body creates against substances in the transfusion. Signs of an allergic reaction may include itching, hives, difficulty breathing, shock, or low blood pressure.  Sudden (acute) or delayed hemolytic reaction. This rare reaction can occur during the transfusion and up to 28 days after the transfusion. The reaction usually occurs when your body's defense system (immune system) attacks the new platelets. Signs of a hemolytic reaction may include fever, headache, difficulty breathing, low blood pressure, a rapid heartbeat, or pain in your back, abdomen, chest, or IV site.  Transfusion-related acute lung injury (TRALI). TRALI can occur within hours of a transfusion,  or several days later. This is a rare reaction that causes lung damage. The cause is not known.  Infection. Signs of this rare complication may include fever, chills, vomiting, a rapid heartbeat, or low blood pressure.  What happens before the procedure?  You may have a blood test to determine your blood type. This is necessary to find out what kind ofplatelets best matches your platelets.  If you have had an allergic reaction to a transfusion in the past, you may be given medicine to help prevent a reaction. Take this medicine only as directed by your health care provider.  Your temperature, blood pressure, and pulse will be monitored before the transfusion. What happens during the procedure?  An IV will be started in your hand or arm.  The transfusion will be attached to your IV tubing. The bag of donated platelets will be attached to your IV tube andgiven into your vein.  Your temperature, blood pressure, and pulse will be monitored regularly during the transfusion. This monitoring is done to help detect early signs of a transfusion reaction.  If you have any signs or symptoms of a reaction, your transfusion will be stopped and you may be given medicine.  When your transfusion is complete, your IV will be removed.  Pressure may be applied to the IV site for a few minutes.  A bandage (dressing) will be applied. The procedure may vary among health care providers and hospitals. What happens after the procedure?  Your blood pressure, temperature, and pulse will be monitored regularly. This information is not intended to replace advice given to you by your health care provider. Make sure you discuss any questions you have   with your health care provider. Document Released: 08/17/2007 Document Revised: 03/27/2016 Document Reviewed: 08/30/2014 Elsevier Interactive Patient Education  2018 [employer]  

## 2017-12-19 LAB — BPAM PLATELET PHERESIS
BLOOD PRODUCT EXPIRATION DATE: 201902162359
ISSUE DATE / TIME: 201902151135
UNIT TYPE AND RH: 5100

## 2017-12-19 LAB — PREPARE PLATELET PHERESIS: Unit division: 0

## 2017-12-19 LAB — CA 125: CANCER ANTIGEN (CA) 125: 2212 U/mL — AB (ref 0.0–38.1)

## 2017-12-20 LAB — SAMPLE TO BLOOD BANK

## 2017-12-21 ENCOUNTER — Telehealth: Payer: Self-pay | Admitting: Hematology and Oncology

## 2017-12-21 ENCOUNTER — Inpatient Hospital Stay: Payer: BC Managed Care – PPO

## 2017-12-21 DIAGNOSIS — T451X5A Adverse effect of antineoplastic and immunosuppressive drugs, initial encounter: Secondary | ICD-10-CM

## 2017-12-21 DIAGNOSIS — D6481 Anemia due to antineoplastic chemotherapy: Secondary | ICD-10-CM

## 2017-12-21 DIAGNOSIS — C549 Malignant neoplasm of corpus uteri, unspecified: Secondary | ICD-10-CM | POA: Diagnosis not present

## 2017-12-21 DIAGNOSIS — C481 Malignant neoplasm of specified parts of peritoneum: Secondary | ICD-10-CM

## 2017-12-21 LAB — CBC WITH DIFFERENTIAL/PLATELET
BASOS ABS: 0.1 10*3/uL (ref 0.0–0.1)
Basophils Relative: 0 %
Eosinophils Absolute: 0.1 10*3/uL (ref 0.0–0.5)
Eosinophils Relative: 1 %
HEMATOCRIT: 24.4 % — AB (ref 34.8–46.6)
Hemoglobin: 7.9 g/dL — ABNORMAL LOW (ref 11.6–15.9)
LYMPHS PCT: 5 %
Lymphs Abs: 0.6 10*3/uL — ABNORMAL LOW (ref 0.9–3.3)
MCH: 33.3 pg (ref 25.1–34.0)
MCHC: 32.5 g/dL (ref 31.5–36.0)
MCV: 102.3 fL — AB (ref 79.5–101.0)
MONO ABS: 1.3 10*3/uL — AB (ref 0.1–0.9)
Monocytes Relative: 9 %
NEUTROS ABS: 11.7 10*3/uL — AB (ref 1.5–6.5)
Neutrophils Relative %: 85 %
Platelets: 64 10*3/uL — ABNORMAL LOW (ref 145–400)
RBC: 2.39 MIL/uL — ABNORMAL LOW (ref 3.70–5.45)
RDW: 20.5 % — AB (ref 11.2–14.5)
WBC: 13.7 10*3/uL — ABNORMAL HIGH (ref 3.9–10.3)

## 2017-12-21 LAB — COMPREHENSIVE METABOLIC PANEL
ALK PHOS: 143 U/L (ref 40–150)
ALT: 14 U/L (ref 0–55)
AST: 16 U/L (ref 5–34)
Albumin: 3.7 g/dL (ref 3.5–5.0)
Anion gap: 11 (ref 3–11)
BILIRUBIN TOTAL: 0.2 mg/dL (ref 0.2–1.2)
BUN: 36 mg/dL — AB (ref 7–26)
CALCIUM: 9.3 mg/dL (ref 8.4–10.4)
CO2: 23 mmol/L (ref 22–29)
Chloride: 106 mmol/L (ref 98–109)
Creatinine, Ser: 1.34 mg/dL — ABNORMAL HIGH (ref 0.60–1.10)
GFR calc Af Amer: 47 mL/min — ABNORMAL LOW (ref >60)
GFR, EST NON AFRICAN AMERICAN: 41 mL/min — AB (ref >60)
Glucose, Bld: 195 mg/dL — ABNORMAL HIGH (ref 70–140)
POTASSIUM: 4.7 mmol/L (ref 3.5–5.1)
Sodium: 140 mmol/L (ref 136–145)
Total Protein: 6.6 g/dL (ref 6.4–8.3)

## 2017-12-21 LAB — SAMPLE TO BLOOD BANK

## 2017-12-21 NOTE — Telephone Encounter (Signed)
I reviewed CBC with the patient. Will cancel chemo tomorrow due to persistent pancytopenia She is not symptomatic, no need transfusion

## 2017-12-21 NOTE — Telephone Encounter (Signed)
Patient declined AVS and calendar of upcoming February appointments. Patient scheduled per 2/18 sch message.

## 2017-12-22 ENCOUNTER — Other Ambulatory Visit: Payer: BC Managed Care – PPO

## 2017-12-22 ENCOUNTER — Ambulatory Visit: Payer: BC Managed Care – PPO

## 2017-12-24 ENCOUNTER — Other Ambulatory Visit: Payer: Self-pay | Admitting: Hematology and Oncology

## 2017-12-24 DIAGNOSIS — C569 Malignant neoplasm of unspecified ovary: Secondary | ICD-10-CM

## 2017-12-28 ENCOUNTER — Inpatient Hospital Stay: Payer: BC Managed Care – PPO

## 2017-12-28 DIAGNOSIS — D6481 Anemia due to antineoplastic chemotherapy: Secondary | ICD-10-CM

## 2017-12-28 DIAGNOSIS — C549 Malignant neoplasm of corpus uteri, unspecified: Secondary | ICD-10-CM | POA: Diagnosis not present

## 2017-12-28 DIAGNOSIS — C481 Malignant neoplasm of specified parts of peritoneum: Secondary | ICD-10-CM

## 2017-12-28 DIAGNOSIS — T451X5A Adverse effect of antineoplastic and immunosuppressive drugs, initial encounter: Principal | ICD-10-CM

## 2017-12-28 LAB — COMPREHENSIVE METABOLIC PANEL
ALBUMIN: 3.9 g/dL (ref 3.5–5.0)
ALT: 16 U/L (ref 0–55)
AST: 16 U/L (ref 5–34)
Alkaline Phosphatase: 129 U/L (ref 40–150)
Anion gap: 12 — ABNORMAL HIGH (ref 3–11)
BUN: 26 mg/dL (ref 7–26)
CHLORIDE: 103 mmol/L (ref 98–109)
CO2: 25 mmol/L (ref 22–29)
CREATININE: 1.06 mg/dL (ref 0.60–1.10)
Calcium: 9.9 mg/dL (ref 8.4–10.4)
GFR calc non Af Amer: 54 mL/min — ABNORMAL LOW (ref >60)
Glucose, Bld: 108 mg/dL (ref 70–140)
Potassium: 4.7 mmol/L (ref 3.5–5.1)
Sodium: 140 mmol/L (ref 136–145)
Total Bilirubin: 0.2 mg/dL (ref 0.2–1.2)
Total Protein: 7 g/dL (ref 6.4–8.3)

## 2017-12-28 LAB — CBC WITH DIFFERENTIAL/PLATELET
BASOS ABS: 0 10*3/uL (ref 0.0–0.1)
BASOS PCT: 0 %
EOS PCT: 3 %
Eosinophils Absolute: 0.2 10*3/uL (ref 0.0–0.5)
HCT: 27.7 % — ABNORMAL LOW (ref 34.8–46.6)
Hemoglobin: 8.7 g/dL — ABNORMAL LOW (ref 11.6–15.9)
Lymphocytes Relative: 5 %
Lymphs Abs: 0.5 10*3/uL — ABNORMAL LOW (ref 0.9–3.3)
MCH: 33.6 pg (ref 25.1–34.0)
MCHC: 31.4 g/dL — ABNORMAL LOW (ref 31.5–36.0)
MCV: 106.9 fL — ABNORMAL HIGH (ref 79.5–101.0)
Monocytes Absolute: 1.2 10*3/uL — ABNORMAL HIGH (ref 0.1–0.9)
Monocytes Relative: 13 %
NEUTROS PCT: 79 %
Neutro Abs: 7.2 10*3/uL — ABNORMAL HIGH (ref 1.5–6.5)
PLATELETS: 178 10*3/uL (ref 145–400)
RBC: 2.59 MIL/uL — AB (ref 3.70–5.45)
RDW: 21.2 % — ABNORMAL HIGH (ref 11.2–14.5)
WBC: 9.1 10*3/uL (ref 3.9–10.3)

## 2017-12-28 LAB — SAMPLE TO BLOOD BANK

## 2017-12-29 ENCOUNTER — Inpatient Hospital Stay (HOSPITAL_BASED_OUTPATIENT_CLINIC_OR_DEPARTMENT_OTHER): Payer: BC Managed Care – PPO | Admitting: Hematology and Oncology

## 2017-12-29 ENCOUNTER — Inpatient Hospital Stay: Payer: BC Managed Care – PPO

## 2017-12-29 ENCOUNTER — Telehealth: Payer: Self-pay | Admitting: Hematology and Oncology

## 2017-12-29 ENCOUNTER — Other Ambulatory Visit: Payer: BC Managed Care – PPO

## 2017-12-29 ENCOUNTER — Encounter: Payer: Self-pay | Admitting: Hematology and Oncology

## 2017-12-29 DIAGNOSIS — C786 Secondary malignant neoplasm of retroperitoneum and peritoneum: Secondary | ICD-10-CM | POA: Diagnosis not present

## 2017-12-29 DIAGNOSIS — Z794 Long term (current) use of insulin: Secondary | ICD-10-CM

## 2017-12-29 DIAGNOSIS — E1140 Type 2 diabetes mellitus with diabetic neuropathy, unspecified: Secondary | ICD-10-CM | POA: Diagnosis not present

## 2017-12-29 DIAGNOSIS — Z79899 Other long term (current) drug therapy: Secondary | ICD-10-CM

## 2017-12-29 DIAGNOSIS — I700 Atherosclerosis of aorta: Secondary | ICD-10-CM

## 2017-12-29 DIAGNOSIS — C548 Malignant neoplasm of overlapping sites of corpus uteri: Secondary | ICD-10-CM | POA: Diagnosis not present

## 2017-12-29 DIAGNOSIS — G620 Drug-induced polyneuropathy: Secondary | ICD-10-CM

## 2017-12-29 DIAGNOSIS — E118 Type 2 diabetes mellitus with unspecified complications: Secondary | ICD-10-CM

## 2017-12-29 DIAGNOSIS — Z9641 Presence of insulin pump (external) (internal): Secondary | ICD-10-CM | POA: Diagnosis not present

## 2017-12-29 DIAGNOSIS — T451X5S Adverse effect of antineoplastic and immunosuppressive drugs, sequela: Secondary | ICD-10-CM

## 2017-12-29 DIAGNOSIS — C569 Malignant neoplasm of unspecified ovary: Secondary | ICD-10-CM

## 2017-12-29 DIAGNOSIS — I2510 Atherosclerotic heart disease of native coronary artery without angina pectoris: Secondary | ICD-10-CM | POA: Diagnosis not present

## 2017-12-29 DIAGNOSIS — D61810 Antineoplastic chemotherapy induced pancytopenia: Secondary | ICD-10-CM | POA: Diagnosis not present

## 2017-12-29 DIAGNOSIS — T451X5A Adverse effect of antineoplastic and immunosuppressive drugs, initial encounter: Secondary | ICD-10-CM

## 2017-12-29 DIAGNOSIS — C481 Malignant neoplasm of specified parts of peritoneum: Secondary | ICD-10-CM

## 2017-12-29 DIAGNOSIS — C549 Malignant neoplasm of corpus uteri, unspecified: Secondary | ICD-10-CM | POA: Diagnosis not present

## 2017-12-29 MED ORDER — SODIUM CHLORIDE 0.9 % IV SOLN
24.5000 mg/m2 | Freq: Once | INTRAVENOUS | Status: AC
  Administered 2017-12-29: 50 mg via INTRAVENOUS
  Filled 2017-12-29: qty 50

## 2017-12-29 MED ORDER — PALONOSETRON HCL INJECTION 0.25 MG/5ML
INTRAVENOUS | Status: AC
  Filled 2017-12-29: qty 5

## 2017-12-29 MED ORDER — SODIUM CHLORIDE 0.9 % IV SOLN
500.0000 mg/m2 | Freq: Once | INTRAVENOUS | Status: AC
  Administered 2017-12-29: 1026 mg via INTRAVENOUS
  Filled 2017-12-29: qty 26.98

## 2017-12-29 MED ORDER — SODIUM CHLORIDE 0.9% FLUSH
10.0000 mL | INTRAVENOUS | Status: DC | PRN
  Administered 2017-12-29: 10 mL
  Filled 2017-12-29: qty 10

## 2017-12-29 MED ORDER — POTASSIUM CHLORIDE 2 MEQ/ML IV SOLN
Freq: Once | INTRAVENOUS | Status: AC
  Administered 2017-12-29: 10:00:00 via INTRAVENOUS
  Filled 2017-12-29: qty 10

## 2017-12-29 MED ORDER — PALONOSETRON HCL INJECTION 0.25 MG/5ML
0.2500 mg | Freq: Once | INTRAVENOUS | Status: AC
  Administered 2017-12-29: 0.25 mg via INTRAVENOUS

## 2017-12-29 MED ORDER — SODIUM CHLORIDE 0.9 % IV SOLN
Freq: Once | INTRAVENOUS | Status: AC
  Administered 2017-12-29: 10:00:00 via INTRAVENOUS

## 2017-12-29 MED ORDER — HYDROCODONE-ACETAMINOPHEN 10-325 MG PO TABS: 1.0000 | ORAL_TABLET | ORAL | 0 refills | Status: DC | PRN

## 2017-12-29 MED ORDER — HEPARIN SOD (PORK) LOCK FLUSH 100 UNIT/ML IV SOLN
500.0000 [IU] | Freq: Once | INTRAVENOUS | Status: AC | PRN
  Administered 2017-12-29: 500 [IU]
  Filled 2017-12-29: qty 5

## 2017-12-29 MED ORDER — FOSAPREPITANT DIMEGLUMINE INJECTION 150 MG
Freq: Once | INTRAVENOUS | Status: AC
  Administered 2017-12-29: 12:00:00 via INTRAVENOUS
  Filled 2017-12-29: qty 5

## 2017-12-29 NOTE — Telephone Encounter (Signed)
Patient declined AVS and calendar of upcoming March appointments.

## 2017-12-29 NOTE — Assessment & Plan Note (Signed)
She has recent fluctuation of blood sugar, likely precipitated by side effects of chemotherapy She follows closely with endocrinologist for medical management

## 2017-12-29 NOTE — Assessment & Plan Note (Signed)
She tolerated treatment poorly with fluctuation of blood sugar and severe pancytopenia I recommend modification of dosing schedule to give her treatment every other week instead However, due to pancytopenia and dependency for blood transfusion products, I plan to bring her back every  other week for transfusion support I plan to repeat imaging study after 3 cycles of chemotherapy, due around January 21, 2018 I will see her back next month before her scheduled chemotherapy to review response to therapy So far, tumor markers are improving

## 2017-12-29 NOTE — Patient Instructions (Signed)
[facility] Discharge Instructions for Patients Receiving Chemotherapy  Today you received the following chemotherapy agents Gemzar and Cisplatin  To help prevent nausea and vomiting after your treatment, we encourage you to take your nausea medication as directed   If you develop nausea and vomiting that is not controlled by your nausea medication, call the clinic.   BELOW ARE SYMPTOMS THAT SHOULD BE REPORTED IMMEDIATELY:  *FEVER GREATER THAN 100.5 F  *CHILLS WITH OR WITHOUT FEVER  NAUSEA AND VOMITING THAT IS NOT CONTROLLED WITH YOUR NAUSEA MEDICATION  *UNUSUAL SHORTNESS OF BREATH  *UNUSUAL BRUISING OR BLEEDING  TENDERNESS IN MOUTH AND THROAT WITH OR WITHOUT PRESENCE OF ULCERS  *URINARY PROBLEMS  *BOWEL PROBLEMS  UNUSUAL RASH Items with * indicate a potential emergency and should be followed up as soon as possible.  Feel free to call the clinic should you have any questions or concerns. The clinic phone number is [phone].  Please show the CHEMO ALERT CARD at check-in to the Emergency Department and triage nurse.   

## 2017-12-29 NOTE — Assessment & Plan Note (Signed)
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

## 2017-12-29 NOTE — Assessment & Plan Note (Signed)
The patient will come back every other week for transfusion support If her hemoglobin is less than 8, she would receive blood transfusion If her platelet count is less than 20,000, she will receive platelet transfusion She does not need transfusion support today

## 2017-12-29 NOTE — Progress Notes (Signed)
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[facility]) She tolerated treatment poorly with fluctuation of blood sugar and severe pancytopenia I recommend modification of dosing schedule to give her treatment every other week instead However, due to pancytopenia and dependency for blood transfusion products, I plan to bring her back every  other week for transfusion support I plan to repeat imaging study after 3 cycles of chemotherapy, due around January 21, 2018 I will see her back next month before her scheduled chemotherapy to review response to therapy So far, tumor markers are improving  Antineoplastic chemotherapy induced pancytopenia ([place]) The patient will come back every other week for transfusion support If her hemoglobin is less than 8, she would receive blood transfusion If her platelet count is less than 20,000, she will receive platelet transfusion She does not need transfusion support today  Chemotherapy-induced neuropathy ([place]) She had history of peripheral neuropathy due to chemotherapy and diabetes Continue to monitor closely  Type 2 diabetes mellitus with complication, with long term current use of insulin pump ([place]) She has recent fluctuation of blood sugar, likely precipitated by side effects of chemotherapy She follows closely with endocrinologist for medical management   Orders Placed This Encounter  Procedures  . CT ABDOMEN PELVIS W CONTRAST    Standing Status:   Future    Standing Expiration Date:   12/29/2018    Order Specific Question:   If indicated for the ordered procedure, I authorize the administration of contrast media per Radiology protocol    Answer:   Yes    Order Specific Question:   Preferred imaging location?    Answer:   [facility]    Order Specific Question:   Radiology Contrast Protocol - do NOT remove  file path    Answer:   [email or web address]    INTERVAL HISTORY: Please see below for problem oriented charting. She returns with her husband for further follow-up She denies nausea, vomiting or mucositis She has peripheral neuropathy grade 1 only affecting the toes Her recent blood sugar is poorly controlled likely precipitated by chemotherapy She has no recent infection The patient denies any recent signs or symptoms of bleeding such as spontaneous epistaxis, hematuria or hematochezia. She denies abdominal bloating, vaginal bleeding or changes in bowel habits  SUMMARY OF ONCOLOGIC HISTORY: Oncology History   Negative genetic testing ER PR positive, Her 2 neu negative     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[place])   04/29/2016 Imaging    CT abd/pelvis- Extensive omental caking as well as moderate amount of ascites within the abdomen most compatible with peritoneal metastatic disease, of unknown primary. This may potentially be ovarian or a GI in etiology.      04/30/2016 Tumor Marker    CA 125- 7149.0 (H)      05/01/2016 Procedure    US paracentesis- Successful ultrasound-guided paracentesis yielding 1.8 liters of peritoneal fluid.      05/01/2016 Imaging    US pelvis- Both transabdominal and transvaginal sonography are significantly limited by large patient habitus and ascites. Neither uterus or ovaries were visualized on this exam.      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      05/08/2016 Imaging    CT chest- No evidence of metastatic disease in the chest. Peritoneal/omental disease  with abdominal ascites in the upper abdomen, incompletely visualized.       05/13/2016 Procedure    Placement of single lumen port a cath via right internal jugular vein. The catheter tip lies at the cavoatrial junction. A power injectable  port a cath was placed and is ready for immediate use.      05/15/2016 Procedure    US Paracentesis- 3400 ml yellow colored ascites removed      05/15/2016 - 09/18/2016 Chemotherapy    Carboplatin/Paclitaxel every 21 days x 7 cycles      07/01/2016 Miscellaneous    Genetic Counseling by [name]-  Genetic testing was normal, and did not reveal a deleterious mutation in these genes.       07/08/2016 Imaging    CT CAP- 1. Small volume ascites, significantly decreased. 2. Stable diffuse omental soft tissue caking and diffuse peritoneal thickening along the bilateral paracolic gutters and bilateral pelvic peritoneal reflections, consistent with peritoneal carcinomatosis. 3. Stable asymmetrically enlarged right ovary, which may represent the primary site of ovarian malignancy. 4. No evidence of metastatic disease in the chest. No new sites of metastatic disease in the abdomen or pelvis.      07/09/2016 Miscellaneous    Gyn Onc re-evaluation- modest response to therapy, 3 more cycles of chemotherapy recommended.        09/11/2016 Imaging    CT C/A/P No significant change omental soft tissue caking, consistent with metastatic disease. Mild ascites is decreased since previous study.  Increased calcification along peritoneal surface in pelvic cul-de-sac, consistent with treated peritoneal metastatic disease.  Stable 4.5cm homogeneous right pelvic mass, which favors a uterine fibroid although right ovarian neoplasm cannot definitely be excluded.  No new or progressive metastatic disease identified. No evidence of metastatic disease within the thorax.       10/14/2016 Procedure    Robotic-assisted laparoscopic total hysterectomy with bilateral salpingoophorectomy, ex lap omentectomy, radical tumor debulking by Dr. Denman George      10/17/2016 Pathology Results    Diagnosis 1. 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 - CERVIX AND ENDOMETRIUM FREE OF TUMOR. - SEE ONCOLOGY TABLE AND COMMENT. 2. Soft tissue, biopsy, umbilical nodule - HIGH GRADE SEROUS CARCINOMA. 3. Omentum, resection for tumor - HIGH GRADE SEROUS CARCINOMA, 33 CM.      11/06/2016 - 12/25/2016 Chemotherapy    Carboplatin/Paclitaxel x 3 cycles       01/12/2017 Imaging    CT CAP- 1. Interval hysterectomy, bilateral salpingo-oophorectomy and omentectomy without evidence of tumor recurrence. 2. No evidence of metastatic disease. 3. 5 mm nonobstructing lower pole left renal calculus.      01/13/2017 Remission    No evidence of residual disease on CT imaging.      05/04/2017 Imaging    CT CAP- New small amount of ascites within the abdomen and pelvis since 01/12/2017 which could indicate disease progression but no new identifiable tumor and no significant change in omental and mild peritoneal thickening.  No evidence of metastatic disease within the chest.  Coronary artery disease.  Aortic Atherosclerosis (ICD10-I70.0).      05/04/2017 Tumor Marker    Patient's tumor was tested for the following markers: CA 125 Results of the tumor marker test revealed 2423      05/18/2017 Imaging    ECHO; EF 60% -  65      05/25/2017 - 07/07/2017 Chemotherapy    She received Doxil and Avastin.  Treatment is stopped due to disease progression      06/01/2017 Procedure    Successful ultrasound-guided therapeutic paracentesis yielding 2.8 liters of peritoneal fluid      06/22/2017 Tumor Marker    Patient's tumor was tested for the following markers: CA 125 Results of the tumor marker test revealed 13440      07/20/2017 Imaging    Mild increase in peritoneal carcinoma within abdomen pelvis since previous study. No significant change and minimal ascites.  No evidence of metastatic disease within the thorax. New mild airspace opacity in left lower lobe, consistent with inflammatory or infectious etiology.       07/30/2017 Tumor Marker    Patient's tumor was tested for the following markers: CA 125 Results of the tumor marker test revealed 12099      07/30/2017 -  Chemotherapy    The patient had gemzar. Cisplatin is added on 11/02/18      08/03/2017 - 08/05/2017 Hospital Admission    She was admitted to the hospital for management of UTI and neutropenic fever      08/13/2017 Adverse Reaction    Dose of chemotherapy is reduced due to neutropenic sepsis      08/24/2017 Tumor Marker    Patient's tumor was tested for the following markers: CA 125 Results of the tumor marker test revealed 7431      09/21/2017 Tumor Marker    Patient's tumor was tested for the following markers: CA 125 Results of the tumor marker test revealed 4176      10/09/2017 Imaging    No significant change in peritoneal carcinomatosis since previous study.  No new or progressive metastatic disease within the abdomen or pelvis.  Stable tiny nonobstructive left renal calculus. No evidence of ureteral calculi or hydronephrosis.      11/10/2017 Tumor Marker    Patient's tumor was tested for the following markers: CA 125 Results of the tumor marker test revealed 4257      11/24/2017 Tumor Marker    Patient's tumor was tested for the following markers: CA 125 Results of the tumor marker test revealed 3377       REVIEW OF SYSTEMS:   Constitutional: Denies fevers, chills or abnormal weight loss Eyes: Denies blurriness of vision Ears, nose, mouth, throat, and face: Denies mucositis or sore throat Respiratory: Denies cough, dyspnea or wheezes Cardiovascular: Denies palpitation, chest discomfort or lower extremity swelling Gastrointestinal:  Denies nausea, heartburn or change in bowel habits Skin: Denies abnormal skin rashes Lymphatics: Denies new lymphadenopathy or easy bruising Neurological:Denies numbness, tingling or new weaknesses Behavioral/Psych: Mood is stable, no new changes  All other systems were  reviewed with the patient and are negative.  I have reviewed the past medical history, past surgical history, social history and family history with the patient and they are unchanged from previous note.  ALLERGIES:  is allergic to avandia [rosiglitazone]; micronase [glyburide]; and actos [pioglitazone].  MEDICATIONS:  Current Outpatient Medications  Medication Sig Dispense Refill  . atorvastatin (LIPITOR) 40 MG tablet TAKE 1 TABLET(40 MG) BY MOUTH DAILY 30 tablet 0  . benazepril (LOTENSIN) 10 MG tablet Take 10 mg by mouth daily.    . furosemide (LASIX) 20 MG tablet TAKE 1 TABLET(20 MG) BY MOUTH THREE TIMES DAILY 90 tablet 0  . gabapentin (NEURONTIN) 300 MG capsule Take 1 capsule (300 mg total) by mouth 3 (three) times daily. 90 capsule 11  . HYDROcodone-acetaminophen (NORCO) 10-325 MG tablet Take 1 tablet by mouth every 4 (four)  hours as needed. 90 tablet 0  . insulin aspart (NOVOLOG) 100 UNIT/ML injection 90 units into insulin pump daily 100 mL 0  . lidocaine-prilocaine (EMLA) cream Apply a quarter size amount to port site 1 hour prior to chemo. Do not rub in. Cover with plastic wrap. 30 g 3  . loperamide (IMODIUM A-D) 2 MG tablet Take 1 tablet (2 mg total) by mouth 4 (four) times daily as needed for diarrhea or loose stools. 30 tablet 0  . metFORMIN (GLUCOPHAGE) 1000 MG tablet TAKE 1 TABLET BY MOUTH TWICE DAILY WITH A MEAL 180 tablet 1  . Multiple Vitamin (MULTIVITAMIN WITH MINERALS) TABS Take 1 tablet by mouth daily.    . multivitamin-lutein (OCUVITE-LUTEIN) CAPS capsule Take 1 capsule by mouth daily.    Marland Kitchen NOVOLOG 100 UNIT/ML injection USE IN INSULIN PUMP AS DIRECTED. MAX DAILY DOSE OF 110 UNITS PER DAY 120 mL 0  . omeprazole (PRILOSEC) 40 MG capsule Take 1 capsule (40 mg total) daily by mouth. 90 capsule 3  . ondansetron (ZOFRAN) 8 MG tablet Take 1 tablet (8 mg total) by mouth every 8 (eight) hours as needed for nausea or vomiting. 90 tablet 3  . ONE TOUCH ULTRA TEST test strip USE TO  CHECK BLOOD SUGAR UP TO 5 TIMES A DAY 450 each 0  . polyethylene glycol (MIRALAX / GLYCOLAX) packet Take 17 g by mouth daily as needed for mild constipation.     . prochlorperazine (COMPAZINE) 10 MG tablet Take 1 tablet (10 mg total) by mouth every 6 (six) hours as needed for nausea or vomiting. 30 tablet 0  . senna (SENOKOT) 8.6 MG TABS tablet Take 1 tablet (8.6 mg total) by mouth at bedtime as needed for moderate constipation. 120 each 0  . vitamin B-12 (CYANOCOBALAMIN) 1000 MCG tablet Take 1,000 mcg by mouth daily.    . vitamin E (VITAMIN E) 400 UNIT capsule Take 400 Units by mouth daily.     No current facility-administered medications for this visit.    Facility-Administered Medications Ordered in Other Visits  Medication Dose Route Frequency Provider Last Rate Last Dose  . 0.9 %  sodium chloride infusion   Intravenous Once Tarah Buboltz, MD      . dextrose 5 % and 0.45% NaCl 1,000 mL with potassium chloride 20 mEq, magnesium sulfate 12 mEq infusion   Intravenous Once Alvy Bimler, Manjinder Breau, MD      . heparin lock flush 100 unit/mL  500 Units Intracatheter Once PRN Alvy Bimler, Johnaton Sonneborn, MD      . sodium chloride flush (NS) 0.9 % injection 10 mL  10 mL Intracatheter PRN Alvy Bimler, Tyliyah Mcmeekin, MD        PHYSICAL EXAMINATION: ECOG PERFORMANCE STATUS: 1 - Symptomatic but completely ambulatory  Vitals:   12/29/17 0846  BP: (!) 135/58  Pulse: 85  Resp: 18  Temp: 97.8 F (36.6 C)  SpO2: 96%   Filed Weights   12/29/17 0846  Weight: 220 lb 3.2 oz (99.9 kg)    GENERAL:alert, no distress and comfortable SKIN: skin color, texture, turgor are normal, no rashes or significant lesions EYES: normal, Conjunctiva are pink and non-injected, sclera clear OROPHARYNX:no exudate, no erythema and lips, buccal mucosa, and tongue normal  NECK: supple, thyroid normal size, non-tender, without nodularity LYMPH:  no palpable lymphadenopathy in the cervical, axillary or inguinal LUNGS: clear to auscultation and percussion with  normal breathing effort HEART: regular rate & rhythm and no murmurs and no lower extremity edema ABDOMEN:abdomen soft, non-tender and normal bowel  sounds Musculoskeletal:no cyanosis of digits and no clubbing  NEURO: alert & oriented x 3 with fluent speech, no focal motor/sensory deficits  LABORATORY DATA:  I have reviewed the data as listed    Component Value Date/Time   NA 140 12/28/2017 0811   NA 139 11/02/2017 0756   K 4.7 12/28/2017 0811   K 4.7 11/02/2017 0756   CL 103 12/28/2017 0811   CO2 25 12/28/2017 0811   CO2 26 11/02/2017 0756   GLUCOSE 108 12/28/2017 0811   GLUCOSE 140 11/02/2017 0756   BUN 26 12/28/2017 0811   BUN 23.7 11/02/2017 0756   CREATININE 1.06 12/28/2017 0811   CREATININE 0.9 11/02/2017 0756   CALCIUM 9.9 12/28/2017 0811   CALCIUM 9.4 11/02/2017 0756   PROT 7.0 12/28/2017 0811   PROT 7.0 11/02/2017 0756   ALBUMIN 3.9 12/28/2017 0811   ALBUMIN 3.5 11/02/2017 0756   AST 16 12/28/2017 0811   AST 18 11/02/2017 0756   ALT 16 12/28/2017 0811   ALT 23 11/02/2017 0756   ALKPHOS 129 12/28/2017 0811   ALKPHOS 108 11/02/2017 0756   BILITOT 0.2 12/28/2017 0811   BILITOT 0.24 11/02/2017 0756   GFRNONAA 54 (L) 12/28/2017 0811   GFRAA >60 12/28/2017 0811    No results found for: SPEP, UPEP  Lab Results  Component Value Date   WBC 9.1 12/28/2017   NEUTROABS 7.2 (H) 12/28/2017   HGB 8.7 (L) 12/28/2017   HCT 27.7 (L) 12/28/2017   MCV 106.9 (H) 12/28/2017   PLT 178 12/28/2017      Chemistry      Component Value Date/Time   NA 140 12/28/2017 0811   NA 139 11/02/2017 0756   K 4.7 12/28/2017 0811   K 4.7 11/02/2017 0756   CL 103 12/28/2017 0811   CO2 25 12/28/2017 0811   CO2 26 11/02/2017 0756   BUN 26 12/28/2017 0811   BUN 23.7 11/02/2017 0756   CREATININE 1.06 12/28/2017 0811   CREATININE 0.9 11/02/2017 0756      Component Value Date/Time   CALCIUM 9.9 12/28/2017 0811   CALCIUM 9.4 11/02/2017 0756   ALKPHOS 129 12/28/2017 0811   ALKPHOS 108  11/02/2017 0756   AST 16 12/28/2017 0811   AST 18 11/02/2017 0756   ALT 16 12/28/2017 0811   ALT 23 11/02/2017 0756   BILITOT 0.2 12/28/2017 0811   BILITOT 0.24 11/02/2017 0756      All questions were answered. The patient knows to call the clinic with any problems, questions or concerns. No barriers to learning was detected.  I spent 30 minutes counseling the patient face to face. The total time spent in the appointment was 40 minutes and more than 50% was on counseling and review of test results  Heath Lark, MD 12/29/2017 9:54 AM

## 2017-12-30 ENCOUNTER — Ambulatory Visit: Payer: BC Managed Care – PPO | Admitting: Internal Medicine

## 2017-12-30 ENCOUNTER — Encounter: Payer: Self-pay | Admitting: Internal Medicine

## 2017-12-30 VITALS — BP 142/64 | HR 106 | Ht 63.0 in | Wt 222.2 lb

## 2017-12-30 DIAGNOSIS — Z794 Long term (current) use of insulin: Secondary | ICD-10-CM

## 2017-12-30 DIAGNOSIS — E119 Type 2 diabetes mellitus without complications: Secondary | ICD-10-CM

## 2017-12-30 DIAGNOSIS — E118 Type 2 diabetes mellitus with unspecified complications: Secondary | ICD-10-CM | POA: Diagnosis not present

## 2017-12-30 DIAGNOSIS — Z9641 Presence of insulin pump (external) (internal): Secondary | ICD-10-CM

## 2017-12-30 MED ORDER — INSULIN ASPART 100 UNIT/ML ~~LOC~~ SOLN: SUBCUTANEOUS | 2 refills | Status: DC

## 2017-12-30 MED ORDER — METFORMIN HCL 1000 MG PO TABS: ORAL_TABLET | ORAL | 3 refills | Status: DC

## 2017-12-30 NOTE — Patient Instructions (Addendum)
Please continue: - Metformin 1000 mg 2x a day  Continue same pump settings: - basal rates: 12 am: 2.10 units/h >> 2.25 8 am: 2.15 >> 2.25 5 pm: 1.90 >> 2.10 - ICR: 8, except 6 with lunch - target:   12 am: 115-115  5 am: 100-100  8 pm: 115-115 - insulin sensitivity factor: 13 - Insulin on Board: 4 h  Please return in 3 months with your sugar log.

## 2017-12-30 NOTE — Progress Notes (Addendum)
ctosamine   Patient ID: Ellen Hunt, female   DOB: 03/24/1951, 67 y.o.   MRN: [id number]   HPI: Ellen Hunt is a 67 y.o.-year-old female, initially referred by her PCP, Eustaquio Maize, MD, now returning for follow-up for DM2, dx 2002, insulin-dependent, uncontrolled, with complications (peripheral neuropathy).  Last visit 3 months ago.  She is here with her husband who offers part of the history, especially about her past medical history and diet.  Of note, patient has a history of ovarian cancer with extraovarian peritoneal carcinomatosis.  She has ascites and a high CA 125, however, this is decreasing.  At last visit, she was undergoing chemotherapy. Since then, she changed her ChTx >> sugars higher. She is getting steroids with the txs.  She has low blood counts. Has had an RBC and a Pt transfusion recently.  Last hemoglobin A1c was: Lab Results  Component Value Date   HGBA1C 5.5 09/29/2017   HGBA1C 6.2 (H) 10/09/2016   HGBA1C 6.8 10/03/2015   She is on the  - Now Medtronic 630 G - since 10/2017. No CGM.  Uses NovoLog in the pump. - Prev. Medtronic 722 insulin pump-since 2012.    She is on: - Metformin 1000 mg 2x a day  Pump settings: - basal rates: 12 am: 2.10 units/h 8 am: 2.15 5 pm: 1.90 - ICR: 8 - target:   12 am: 115-115  5 am: 100-100  8 pm: 115-115 - ISF: 13 - Insulin on Board: 4 h - bolus wizard: on TDD from basal insulin: 65% >> 50% TDD from bolus insulin: 35% >> 50% TDD 76.4 >> up to 100 units - extended bolusing: not using - changes infusion site: q 3 days - Meter: One Touch Ultra 2  Pt checks her sugars 4.1 times a day and they are - ave 206 +/- 70: - am: 117-158, 166, 181 >> 124-280, 318 - 2h after b'fast: n/c - before lunch: 81-136, 156 >> 113-215 - 2h after lunch: n/c - before dinner: 86-179, 185 >> 143-242, 376 - 2h after dinner: 145 >> 153-215, >400 - bedtime: n/c - nighttime: 155-168 >> 170-344 Lowest sugar was 60s >> 113; she has  hypoglycemia awareness in the 70s.  No previous hypoglycemia admission. She does have a glucagon kit at home.   Higher sugars: 400s 9ChTx) >> >400.   Pt's meals are: - Breakfast: egg + raisin bread + sometimes bacon - Lunch: varies, may eat out; tomato + cottage cheese - Dinner: meat + veggies + starch - Snacks: bananas, oranges, PB, icecream Caffeine-free diet coke, Sprite 0  - No history ofCKD, last BUN/creatinine:  Lab Results  Component Value Date   BUN 26 12/28/2017   BUN 36 (H) 12/21/2017   CREATININE 1.06 12/28/2017   CREATININE 1.34 (H) 12/21/2017  On benazepril. -+ Dyslipidemia; last set of lipids: Lab Results  Component Value Date   CHOL 129 03/29/2015   HDL 46 03/29/2015   LDLCALC 52 03/29/2015   LDLDIRECT 57 03/29/2015   TRIG 157 (H) 03/29/2015   CHOLHDL 2.8 03/29/2015  On atorvastatin. -+  Numbness and tingling in her feet. Was on Gabapentin 100 mg 2x a day >> swelling. Now on 300 mg 2x a day  By Dr. Alvy Bimler - some leg swelling.   ROS: Constitutional: + Fatigue, + weight gain, no subjective hyperthermia, no subjective hypothermia Eyes: no blurry vision, no xerophthalmia ENT: no sore throat, no nodules palpated in throat, no dysphagia, no odynophagia, no hoarseness Cardiovascular: no CP/no  SOB/no palpitations/+ leg swelling Respiratory: no cough/no SOB/no wheezing Gastrointestinal: no N/no V/no D/no C/no acid reflux Musculoskeletal: no muscle aches/no joint aches Skin: no rashes, + hair loss Neurological: no tremors/+ numbness/+ tingling/no dizziness  I reviewed pt's medications, allergies, PMH, social hx, family hx, and changes were documented in the history of present illness. Otherwise, unchanged from my initial visit note.  Past Medical History:  Diagnosis Date  . Diabetes mellitus without complication (HCC)    on insulin pump  . Dysrhythmia   . 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[place]) 05/09/2016  . Family history of breast cancer   . GERD  (gastroesophageal reflux disease)   . History of blood transfusion   . History of bronchitis   . History of chemotherapy   . History of urinary tract infection   . Hyperlipidemia   . Hypertension   . Low serum vitamin D   . Ovarian cancer ([place]) 05/09/2016  . Shingles    Past Surgical History:  Procedure Laterality Date  . ABDOMINAL HYSTERECTOMY  10/14/2016  . CESAREAN SECTION    . DEBULKING N/A 10/14/2016   Procedure: DEBULKING;  Surgeon: Everitt Amber, MD;  Location: [facility];  Service: Gynecology;  Laterality: N/A;  . IR PARACENTESIS  06/01/2017  . LAPAROTOMY WITH STAGING N/A 10/14/2016   Procedure: LAPAROTOMY WITH OMENTECTOMY AND TUMOR DEBULGING;  Surgeon: Everitt Amber, MD;  Location: [facility];  Service: Gynecology;  Laterality: N/A;  . LUMBAR FUSION  08/21/15   L3-L4 Dr. Timmothy Euler  . OMENTECTOMY N/A 10/14/2016   Procedure: OMENTECTOMY;  Surgeon: Everitt Amber, MD;  Location: [facility];  Service: Gynecology;  Laterality: N/A;  . ROBOTIC ASSISTED TOTAL HYSTERECTOMY WITH BILATERAL SALPINGO OOPHERECTOMY Bilateral 10/14/2016   Procedure: XI ROBOTIC ASSISTED TOTAL LAPARSCOPIC  HYSTERECTOMY WITH BILATERAL SALPINGO OOPHORECTOMY;  Surgeon: Everitt Amber, MD;  Location: [facility];  Service: Gynecology;  Laterality: Bilateral;   Social History   Socioeconomic History  . Marital status: Married    Spouse name: Gershon Mussel  . Number of children: 2  Social Needs  Occupational History  . Occupation: Retired; [employer]  Tobacco Use  . Smoking status: Never Smoker  . Smokeless tobacco: Never Used  Substance and Sexual Activity  . Alcohol use: No  . Drug use: No  . Sexual activity: Not on file    Comment: married   Current Outpatient Medications on File Prior to Visit  Medication Sig Dispense Refill  . atorvastatin (LIPITOR) 40 MG tablet TAKE 1 TABLET(40 MG) BY MOUTH DAILY 30 tablet 0  . benazepril (LOTENSIN) 10 MG tablet Take 10 mg by mouth daily.    . furosemide (LASIX) 20 MG tablet TAKE 1  TABLET(20 MG) BY MOUTH THREE TIMES DAILY 90 tablet 0  . gabapentin (NEURONTIN) 300 MG capsule Take 1 capsule (300 mg total) by mouth 3 (three) times daily. 90 capsule 11  . HYDROcodone-acetaminophen (NORCO) 10-325 MG tablet Take 1 tablet by mouth every 4 (four) hours as needed. 90 tablet 0  . insulin aspart (NOVOLOG) 100 UNIT/ML injection 90 units into insulin pump daily 100 mL 0  . lidocaine-prilocaine (EMLA) cream Apply a quarter size amount to port site 1 hour prior to chemo. Do not rub in. Cover with plastic wrap. 30 g 3  . loperamide (IMODIUM A-D) 2 MG tablet Take 1 tablet (2 mg total) by mouth 4 (four) times daily as needed for diarrhea or loose stools. 30 tablet 0  . metFORMIN (GLUCOPHAGE) 1000 MG tablet TAKE 1 TABLET BY MOUTH  TWICE DAILY WITH A MEAL 180 tablet 1  . Multiple Vitamin (MULTIVITAMIN WITH MINERALS) TABS Take 1 tablet by mouth daily.    . multivitamin-lutein (OCUVITE-LUTEIN) CAPS capsule Take 1 capsule by mouth daily.    Marland Kitchen NOVOLOG 100 UNIT/ML injection USE IN INSULIN PUMP AS DIRECTED. MAX DAILY DOSE OF 110 UNITS PER DAY 120 mL 0  . omeprazole (PRILOSEC) 40 MG capsule Take 1 capsule (40 mg total) daily by mouth. 90 capsule 3  . ondansetron (ZOFRAN) 8 MG tablet Take 1 tablet (8 mg total) by mouth every 8 (eight) hours as needed for nausea or vomiting. 90 tablet 3  . ONE TOUCH ULTRA TEST test strip USE TO CHECK BLOOD SUGAR UP TO 5 TIMES A DAY 450 each 0  . polyethylene glycol (MIRALAX / GLYCOLAX) packet Take 17 g by mouth daily as needed for mild constipation.     . prochlorperazine (COMPAZINE) 10 MG tablet Take 1 tablet (10 mg total) by mouth every 6 (six) hours as needed for nausea or vomiting. 30 tablet 0  . senna (SENOKOT) 8.6 MG TABS tablet Take 1 tablet (8.6 mg total) by mouth at bedtime as needed for moderate constipation. 120 each 0  . vitamin B-12 (CYANOCOBALAMIN) 1000 MCG tablet Take 1,000 mcg by mouth daily.    . vitamin E (VITAMIN E) 400 UNIT capsule Take 400 Units by  mouth daily.     No current facility-administered medications on file prior to visit.    Allergies  Allergen Reactions  . Avandia [Rosiglitazone] Other (See Comments)    Legs swelled  . Micronase [Glyburide] Swelling  . Actos [Pioglitazone] Other (See Comments)    Edema / leg swelling   Family History  Problem Relation Age of Onset  . Lung cancer Mother        smoker; dx in her 22s  . Leukemia Father   . Diabetes Paternal Grandmother   . Heart attack Paternal Grandmother   . Diabetes Paternal Grandfather   . Breast cancer Paternal Aunt        dx in her 60s-30s  . Leukemia Paternal Uncle   . Heart attack Maternal Grandfather   . Breast cancer Cousin        maternal first cousin    PE: BP (!) 142/64   Pulse (!) 106   Ht '5\' 3"'  (1.6 m)   Wt 222 lb 3.2 oz (100.8 kg)   SpO2 96%   BMI 39.36 kg/m  Wt Readings from Last 3 Encounters:  12/30/17 222 lb 3.2 oz (100.8 kg)  12/29/17 220 lb 3.2 oz (99.9 kg)  12/18/17 219 lb 9.6 oz (99.6 kg)   Constitutional: overweight, in NAD Eyes: PERRLA, EOMI, no exophthalmos ENT: moist mucous membranes, no thyromegaly, no cervical lymphadenopathy Cardiovascular: tachycardia, RR, No MRG, + bilateral pitting lower extremity edema Respiratory: CTA B Gastrointestinal: abdomen soft, NT, ND, BS+ Musculoskeletal: no deformities, strength intact in all 4 Skin: moist, warm, no rashes Neurological: no tremor with outstretched hands, DTR normal in all 4  ASSESSMENT: 1. DM1, uncontrolled, with complications - Peripheral neuropathy  PLAN:  1. Patient with long-standing, fairly well-controlled type 2 diabetes, on insulin pump therapy.  Before last visit, her sugars were higher during chemotherapy as she was having dexamethasone along with the treatments.  Highest sugar was in the 400s.  However, at last visit, I reviewed her insulin pump downloads along with patient and her husband.  She has slightly higher blood sugars in the morning but otherwise  sugars  throughout the day were better and close to target.  As she was still undergoing chemotherapy we decided not to change her regimen at that time.  We also continued metformin.   - I also advised her at last visit to call Medtronic to see if she can change her pump to a newer model  >> she is now on the newer, 630G pump. She likes it - However, she is still not proficient and entering the changes in her pump, so I had to do it for her. - At last visit, HbA1c was excellent, at 5.5% (however, she also had a low hemoglobin the day before the HbA1c was measured, and this could have influenced the results) - At this visit, sugars are higher, which are consistent with what she had before the previous round of chemotherapy, before last visit.  She mentions that her basal rates were even higher before, higher than 2.3. - At this visit, we reviewed together her pump downloads and these show the following patterns:   Sugars are high in the morning, therefore, will increase basal rates at night  They decreased after breakfast which is appropriate, so we will not modify the insulin to carb ratio with breakfast  Sugars are higher before dinner and after lunch, therefore, will decrease her insulin to carb ratio with lunch  They improve after dinner, therefore, will continue the same insulin to carb ratio with dinner  Since sugars are fairly high overall during the day, we will also increase her basal rates during the day - I suggested:  Patient Instructions  Please continue: - Metformin 1000 mg 2x a day  Continue same pump settings: - basal rates: 12 am: 2.10 units/h >> 2.25 8 am: 2.15 >> 2.25 5 pm: 1.90 >> 2.10 - ICR: 8, except 6 with lunch - target:   12 am: 115-115  5 am: 100-100  8 pm: 115-115 - insulin sensitivity factor: 13 - Insulin on Board: 4 h  Please return in 3 months with your sugar log.   - today, will check a fructosamine as her HbA1c will not be accurate in the presence of  a low Hb and a recent RBC transfusion - continue checking sugars at different times of the day - check 4x a day, rotating checks - advised for yearly eye exams >> she is UTD - Return to clinic in 3 mo with sugar log   - time spent with the patient: 40 min, of which >50% was spent in reviewing her pump downloads, discussing her hyper-glycemic episodes, reviewing previous labs and pump settings and developing a plan to avoid hypo- and hyper-glycemia.  I also spent time entering her new pump settings into the pump and adjusting the maximum basal settings.  Office Visit on 12/30/2017  Component Date Value Ref Range Status  . Fructosamine 12/30/2017 254  190 - 270 umol/L Final   12/30/2017: HbA1c calculated from fructosamine is 5.9% (does not appear to be accurate as does not correlate with CBGs at home). Will continue to follow her CBGs.  Philemon Kingdom, MD PhD [facility] Endocrinology

## 2017-12-31 LAB — FRUCTOSAMINE: Fructosamine: 254 umol/L (ref 190–270)

## 2018-01-01 ENCOUNTER — Encounter: Payer: Self-pay | Admitting: Internal Medicine

## 2018-01-01 ENCOUNTER — Other Ambulatory Visit: Payer: Self-pay | Admitting: *Deleted

## 2018-01-01 ENCOUNTER — Ambulatory Visit (HOSPITAL_COMMUNITY)
Admission: RE | Admit: 2018-01-01 | Discharge: 2018-01-01 | Disposition: A | Discharge status: Home / Self Care | Payer: BC Managed Care – PPO | Source: Ambulatory Visit | Attending: Hematology and Oncology | Admitting: Hematology and Oncology

## 2018-01-01 MED ORDER — GLUCOSE BLOOD VI STRP: ORAL_STRIP | 0 refills | Status: DC

## 2018-01-04 ENCOUNTER — Encounter: Payer: Self-pay | Admitting: Internal Medicine

## 2018-01-05 ENCOUNTER — Inpatient Hospital Stay: Payer: BC Managed Care – PPO | Attending: Hematology and Oncology

## 2018-01-05 ENCOUNTER — Ambulatory Visit: Payer: BC Managed Care – PPO

## 2018-01-05 ENCOUNTER — Inpatient Hospital Stay: Payer: BC Managed Care – PPO

## 2018-01-05 VITALS — BP 124/48 | HR 89 | Temp 98.5°F | Resp 18

## 2018-01-05 DIAGNOSIS — Z5111 Encounter for antineoplastic chemotherapy: Secondary | ICD-10-CM | POA: Diagnosis not present

## 2018-01-05 DIAGNOSIS — Z803 Family history of malignant neoplasm of breast: Secondary | ICD-10-CM | POA: Insufficient documentation

## 2018-01-05 DIAGNOSIS — N179 Acute kidney failure, unspecified: Secondary | ICD-10-CM | POA: Insufficient documentation

## 2018-01-05 DIAGNOSIS — D6481 Anemia due to antineoplastic chemotherapy: Secondary | ICD-10-CM

## 2018-01-05 DIAGNOSIS — R197 Diarrhea, unspecified: Secondary | ICD-10-CM | POA: Insufficient documentation

## 2018-01-05 DIAGNOSIS — T451X5A Adverse effect of antineoplastic and immunosuppressive drugs, initial encounter: Secondary | ICD-10-CM

## 2018-01-05 DIAGNOSIS — Z801 Family history of malignant neoplasm of trachea, bronchus and lung: Secondary | ICD-10-CM | POA: Diagnosis not present

## 2018-01-05 DIAGNOSIS — Z9641 Presence of insulin pump (external) (internal): Secondary | ICD-10-CM | POA: Insufficient documentation

## 2018-01-05 DIAGNOSIS — E860 Dehydration: Secondary | ICD-10-CM | POA: Diagnosis not present

## 2018-01-05 DIAGNOSIS — N200 Calculus of kidney: Secondary | ICD-10-CM | POA: Diagnosis not present

## 2018-01-05 DIAGNOSIS — E538 Deficiency of other specified B group vitamins: Secondary | ICD-10-CM | POA: Insufficient documentation

## 2018-01-05 DIAGNOSIS — Z90722 Acquired absence of ovaries, bilateral: Secondary | ICD-10-CM | POA: Diagnosis not present

## 2018-01-05 DIAGNOSIS — E119 Type 2 diabetes mellitus without complications: Secondary | ICD-10-CM | POA: Insufficient documentation

## 2018-01-05 DIAGNOSIS — Z806 Family history of leukemia: Secondary | ICD-10-CM | POA: Insufficient documentation

## 2018-01-05 DIAGNOSIS — E785 Hyperlipidemia, unspecified: Secondary | ICD-10-CM | POA: Insufficient documentation

## 2018-01-05 DIAGNOSIS — Z90710 Acquired absence of both cervix and uterus: Secondary | ICD-10-CM | POA: Diagnosis not present

## 2018-01-05 DIAGNOSIS — C481 Malignant neoplasm of specified parts of peritoneum: Secondary | ICD-10-CM | POA: Diagnosis not present

## 2018-01-05 DIAGNOSIS — I10 Essential (primary) hypertension: Secondary | ICD-10-CM | POA: Insufficient documentation

## 2018-01-05 DIAGNOSIS — C569 Malignant neoplasm of unspecified ovary: Secondary | ICD-10-CM

## 2018-01-05 LAB — COMPREHENSIVE METABOLIC PANEL
ALT: 17 U/L (ref 0–55)
AST: 15 U/L (ref 5–34)
Albumin: 3.7 g/dL (ref 3.5–5.0)
Alkaline Phosphatase: 100 U/L (ref 40–150)
Anion gap: 13 — ABNORMAL HIGH (ref 3–11)
BUN: 41 mg/dL — ABNORMAL HIGH (ref 7–26)
CHLORIDE: 106 mmol/L (ref 98–109)
CO2: 22 mmol/L (ref 22–29)
CREATININE: 1.2 mg/dL — AB (ref 0.60–1.10)
Calcium: 9.5 mg/dL (ref 8.4–10.4)
GFR calc non Af Amer: 47 mL/min — ABNORMAL LOW (ref >60)
GFR, EST AFRICAN AMERICAN: 54 mL/min — AB (ref >60)
Glucose, Bld: 115 mg/dL (ref 70–140)
POTASSIUM: 4.3 mmol/L (ref 3.5–5.1)
Sodium: 141 mmol/L (ref 136–145)
TOTAL PROTEIN: 7 g/dL (ref 6.4–8.3)
Total Bilirubin: 0.4 mg/dL (ref 0.2–1.2)

## 2018-01-05 LAB — SAMPLE TO BLOOD BANK

## 2018-01-05 LAB — CBC WITH DIFFERENTIAL/PLATELET
Basophils Absolute: 0 10*3/uL (ref 0.0–0.1)
Basophils Relative: 0 %
Eosinophils Absolute: 0.1 10*3/uL (ref 0.0–0.5)
Eosinophils Relative: 2 %
HCT: 25.2 % — ABNORMAL LOW (ref 34.8–46.6)
Hemoglobin: 8 g/dL — ABNORMAL LOW (ref 11.6–15.9)
LYMPHS ABS: 0.3 10*3/uL — AB (ref 0.9–3.3)
Lymphocytes Relative: 12 %
MCH: 33.1 pg (ref 25.1–34.0)
MCHC: 31.7 g/dL (ref 31.5–36.0)
MCV: 104.1 fL — AB (ref 79.5–101.0)
MONO ABS: 0.2 10*3/uL (ref 0.1–0.9)
MONOS PCT: 8 %
Neutro Abs: 2.2 10*3/uL (ref 1.5–6.5)
Neutrophils Relative %: 78 %
PLATELETS: 114 10*3/uL — AB (ref 145–400)
RBC: 2.42 MIL/uL — ABNORMAL LOW (ref 3.70–5.45)
RDW: 19.7 % — AB (ref 11.2–14.5)
WBC: 2.8 10*3/uL — ABNORMAL LOW (ref 3.9–10.3)

## 2018-01-05 LAB — PREPARE RBC (CROSSMATCH)

## 2018-01-05 MED ORDER — ACETAMINOPHEN 325 MG PO TABS
ORAL_TABLET | ORAL | Status: AC
  Filled 2018-01-05: qty 2

## 2018-01-05 MED ORDER — DIPHENHYDRAMINE HCL 25 MG PO CAPS
25.0000 mg | ORAL_CAPSULE | Freq: Once | ORAL | Status: AC
  Administered 2018-01-05: 25 mg via ORAL

## 2018-01-05 MED ORDER — HEPARIN SOD (PORK) LOCK FLUSH 100 UNIT/ML IV SOLN
250.0000 [IU] | INTRAVENOUS | Status: AC | PRN
  Administered 2018-01-05: 250 [IU]
  Filled 2018-01-05: qty 5

## 2018-01-05 MED ORDER — DIPHENHYDRAMINE HCL 25 MG PO CAPS
ORAL_CAPSULE | ORAL | Status: AC
  Filled 2018-01-05: qty 1

## 2018-01-05 MED ORDER — SODIUM CHLORIDE 0.9% FLUSH
10.0000 mL | Freq: Once | INTRAVENOUS | Status: DC
  Filled 2018-01-05: qty 10

## 2018-01-05 MED ORDER — SODIUM CHLORIDE 0.9% FLUSH
10.0000 mL | INTRAVENOUS | Status: AC | PRN
  Administered 2018-01-05: 10 mL
  Filled 2018-01-05: qty 10

## 2018-01-05 MED ORDER — ACETAMINOPHEN 325 MG PO TABS
650.0000 mg | ORAL_TABLET | Freq: Once | ORAL | Status: AC
  Administered 2018-01-05: 650 mg via ORAL

## 2018-01-05 MED ORDER — SODIUM CHLORIDE 0.9 % IV SOLN
250.0000 mL | Freq: Once | INTRAVENOUS | Status: AC
  Administered 2018-01-05: 250 mL via INTRAVENOUS

## 2018-01-05 NOTE — Patient Instructions (Signed)

## 2018-01-06 LAB — TYPE AND SCREEN
ABO/RH(D): O POS
Antibody Screen: NEGATIVE
Unit division: 0
Unit division: 0

## 2018-01-06 LAB — BPAM RBC
BLOOD PRODUCT EXPIRATION DATE: 201903312359
BLOOD PRODUCT EXPIRATION DATE: 201903312359
ISSUE DATE / TIME: 201903051049
ISSUE DATE / TIME: 201903051049
UNIT TYPE AND RH: 5100
Unit Type and Rh: 5100

## 2018-01-12 ENCOUNTER — Inpatient Hospital Stay: Payer: BC Managed Care – PPO

## 2018-01-12 ENCOUNTER — Telehealth: Payer: Self-pay | Admitting: *Deleted

## 2018-01-12 ENCOUNTER — Other Ambulatory Visit: Payer: Self-pay | Admitting: Hematology and Oncology

## 2018-01-12 VITALS — BP 155/66 | HR 88 | Temp 98.2°F | Resp 18

## 2018-01-12 DIAGNOSIS — C481 Malignant neoplasm of specified parts of peritoneum: Secondary | ICD-10-CM

## 2018-01-12 DIAGNOSIS — E538 Deficiency of other specified B group vitamins: Secondary | ICD-10-CM

## 2018-01-12 DIAGNOSIS — D6481 Anemia due to antineoplastic chemotherapy: Secondary | ICD-10-CM

## 2018-01-12 DIAGNOSIS — T451X5A Adverse effect of antineoplastic and immunosuppressive drugs, initial encounter: Secondary | ICD-10-CM

## 2018-01-12 DIAGNOSIS — C569 Malignant neoplasm of unspecified ovary: Secondary | ICD-10-CM

## 2018-01-12 LAB — COMPREHENSIVE METABOLIC PANEL
ALBUMIN: 3.8 g/dL (ref 3.5–5.0)
ALT: 14 U/L (ref 0–55)
ANION GAP: 11 (ref 3–11)
AST: 13 U/L (ref 5–34)
Alkaline Phosphatase: 126 U/L (ref 40–150)
BUN: 32 mg/dL — AB (ref 7–26)
CHLORIDE: 106 mmol/L (ref 98–109)
CO2: 23 mmol/L (ref 22–29)
Calcium: 9.5 mg/dL (ref 8.4–10.4)
Creatinine, Ser: 1.13 mg/dL — ABNORMAL HIGH (ref 0.60–1.10)
GFR calc Af Amer: 58 mL/min — ABNORMAL LOW (ref >60)
GFR, EST NON AFRICAN AMERICAN: 50 mL/min — AB (ref >60)
Glucose, Bld: 133 mg/dL (ref 70–140)
POTASSIUM: 4.4 mmol/L (ref 3.5–5.1)
Sodium: 140 mmol/L (ref 136–145)
Total Bilirubin: 0.3 mg/dL (ref 0.2–1.2)
Total Protein: 6.9 g/dL (ref 6.4–8.3)

## 2018-01-12 LAB — CBC WITH DIFFERENTIAL/PLATELET
BASOS ABS: 0 10*3/uL (ref 0.0–0.1)
BASOS PCT: 0 %
EOS PCT: 3 %
Eosinophils Absolute: 0.2 10*3/uL (ref 0.0–0.5)
HCT: 32.4 % — ABNORMAL LOW (ref 34.8–46.6)
Hemoglobin: 10.3 g/dL — ABNORMAL LOW (ref 11.6–15.9)
Lymphocytes Relative: 9 %
Lymphs Abs: 0.6 10*3/uL — ABNORMAL LOW (ref 0.9–3.3)
MCH: 32.5 pg (ref 25.1–34.0)
MCHC: 31.8 g/dL (ref 31.5–36.0)
MCV: 102.2 fL — AB (ref 79.5–101.0)
MONO ABS: 0.5 10*3/uL (ref 0.1–0.9)
Monocytes Relative: 7 %
Neutro Abs: 5.4 10*3/uL (ref 1.5–6.5)
Neutrophils Relative %: 81 %
PLATELETS: 101 10*3/uL — AB (ref 145–400)
RBC: 3.17 MIL/uL — ABNORMAL LOW (ref 3.70–5.45)
RDW: 19.3 % — AB (ref 11.2–14.5)
WBC: 6.6 10*3/uL (ref 3.9–10.3)

## 2018-01-12 LAB — SAMPLE TO BLOOD BANK

## 2018-01-12 LAB — MAGNESIUM: MAGNESIUM: 1 mg/dL — AB (ref 1.7–2.4)

## 2018-01-12 MED ORDER — SODIUM CHLORIDE 0.9 % IV SOLN
Freq: Once | INTRAVENOUS | Status: AC
  Administered 2018-01-12: 11:00:00 via INTRAVENOUS
  Filled 2018-01-12: qty 5

## 2018-01-12 MED ORDER — MAGNESIUM OXIDE 400 (241.3 MG) MG PO TABS: 400.0000 mg | ORAL_TABLET | Freq: Two times a day (BID) | ORAL | 0 refills | Status: DC

## 2018-01-12 MED ORDER — HYDROCODONE-ACETAMINOPHEN 10-325 MG PO TABS: 1.0000 | ORAL_TABLET | ORAL | 0 refills | Status: DC | PRN

## 2018-01-12 MED ORDER — POTASSIUM CHLORIDE 2 MEQ/ML IV SOLN
Freq: Once | INTRAVENOUS | Status: AC
  Administered 2018-01-12: 09:00:00 via INTRAVENOUS
  Filled 2018-01-12: qty 10

## 2018-01-12 MED ORDER — PEGFILGRASTIM 6 MG/0.6ML ~~LOC~~ PSKT
PREFILLED_SYRINGE | SUBCUTANEOUS | Status: AC
  Filled 2018-01-12: qty 0.6

## 2018-01-12 MED ORDER — SODIUM CHLORIDE 0.9 % IV SOLN
24.5000 mg/m2 | Freq: Once | INTRAVENOUS | Status: AC
  Administered 2018-01-12: 50 mg via INTRAVENOUS
  Filled 2018-01-12: qty 50

## 2018-01-12 MED ORDER — PEGFILGRASTIM 6 MG/0.6ML ~~LOC~~ PSKT
6.0000 mg | PREFILLED_SYRINGE | Freq: Once | SUBCUTANEOUS | Status: AC
  Administered 2018-01-12: 6 mg via SUBCUTANEOUS

## 2018-01-12 MED ORDER — SODIUM CHLORIDE 0.9% FLUSH
10.0000 mL | INTRAVENOUS | Status: DC | PRN
  Administered 2018-01-12: 10 mL
  Filled 2018-01-12: qty 10

## 2018-01-12 MED ORDER — SODIUM CHLORIDE 0.9 % IV SOLN
Freq: Once | INTRAVENOUS | Status: AC
  Administered 2018-01-12: 11:00:00 via INTRAVENOUS

## 2018-01-12 MED ORDER — HEPARIN SOD (PORK) LOCK FLUSH 100 UNIT/ML IV SOLN
500.0000 [IU] | Freq: Once | INTRAVENOUS | Status: AC | PRN
  Administered 2018-01-12: 500 [IU]
  Filled 2018-01-12: qty 5

## 2018-01-12 MED ORDER — SODIUM CHLORIDE 0.9 % IV SOLN
500.0000 mg/m2 | Freq: Once | INTRAVENOUS | Status: AC
  Administered 2018-01-12: 1026 mg via INTRAVENOUS
  Filled 2018-01-12: qty 26.98

## 2018-01-12 MED ORDER — PALONOSETRON HCL INJECTION 0.25 MG/5ML
INTRAVENOUS | Status: AC
  Filled 2018-01-12: qty 5

## 2018-01-12 MED ORDER — PALONOSETRON HCL INJECTION 0.25 MG/5ML
0.2500 mg | Freq: Once | INTRAVENOUS | Status: AC
  Administered 2018-01-12: 0.25 mg via INTRAVENOUS

## 2018-01-12 MED ORDER — SODIUM CHLORIDE 0.9% FLUSH
10.0000 mL | Freq: Once | INTRAVENOUS | Status: AC
  Administered 2018-01-12: 10 mL
  Filled 2018-01-12: qty 10

## 2018-01-12 NOTE — Telephone Encounter (Signed)
Notified of message below

## 2018-01-12 NOTE — Telephone Encounter (Signed)
-----   Message from Heath Lark, MD sent at 01/12/2018  9:52 AM EDT ----- Regarding: PO magnesium Can you call in on oral magnesium? 400 mg BID PO ----- Message ----- From: Interface, Lab In [place] Sent: 01/12/2018   8:19 AM To: Heath Lark, MD

## 2018-01-12 NOTE — Patient Instructions (Signed)
[facility] Discharge Instructions for Patients Receiving Chemotherapy  Today you received the following chemotherapy agents Cisplatin and Gemzar  To help prevent nausea and vomiting after your treatment, we encourage you to take your nausea medication as directed.    If you develop nausea and vomiting that is not controlled by your nausea medication, call the clinic.   BELOW ARE SYMPTOMS THAT SHOULD BE REPORTED IMMEDIATELY:  *FEVER GREATER THAN 100.5 F  *CHILLS WITH OR WITHOUT FEVER  NAUSEA AND VOMITING THAT IS NOT CONTROLLED WITH YOUR NAUSEA MEDICATION  *UNUSUAL SHORTNESS OF BREATH  *UNUSUAL BRUISING OR BLEEDING  TENDERNESS IN MOUTH AND THROAT WITH OR WITHOUT PRESENCE OF ULCERS  *URINARY PROBLEMS  *BOWEL PROBLEMS  UNUSUAL RASH Items with * indicate a potential emergency and should be followed up as soon as possible.  Feel free to call the clinic should you have any questions or concerns. The clinic phone number is [phone].  Please show the CHEMO ALERT CARD at check-in to the Emergency Department and triage nurse.   

## 2018-01-13 ENCOUNTER — Telehealth: Payer: Self-pay | Admitting: *Deleted

## 2018-01-13 LAB — CA 125: CANCER ANTIGEN (CA) 125: 1595 U/mL — AB (ref 0.0–38.1)

## 2018-01-13 NOTE — Telephone Encounter (Signed)
-----   Message from Heath Lark, MD sent at 01/13/2018  8:46 AM EDT ----- Regarding: CA-125 Result looks good. pls let her know ----- Message ----- From: Interface, Lab In [place] Sent: 01/12/2018   8:19 AM To: Heath Lark, MD

## 2018-01-13 NOTE — Telephone Encounter (Signed)
Notified of message below

## 2018-01-19 ENCOUNTER — Other Ambulatory Visit: Payer: Self-pay | Admitting: Hematology and Oncology

## 2018-01-19 ENCOUNTER — Inpatient Hospital Stay: Payer: BC Managed Care – PPO

## 2018-01-19 DIAGNOSIS — C569 Malignant neoplasm of unspecified ovary: Secondary | ICD-10-CM

## 2018-01-19 DIAGNOSIS — C481 Malignant neoplasm of specified parts of peritoneum: Secondary | ICD-10-CM | POA: Diagnosis not present

## 2018-01-19 DIAGNOSIS — T451X5A Adverse effect of antineoplastic and immunosuppressive drugs, initial encounter: Secondary | ICD-10-CM

## 2018-01-19 DIAGNOSIS — D6481 Anemia due to antineoplastic chemotherapy: Secondary | ICD-10-CM

## 2018-01-19 LAB — CBC WITH DIFFERENTIAL/PLATELET
BASOS ABS: 0 10*3/uL (ref 0.0–0.1)
BASOS PCT: 0 %
EOS PCT: 1 %
Eosinophils Absolute: 0.1 10*3/uL (ref 0.0–0.5)
HCT: 32.4 % — ABNORMAL LOW (ref 34.8–46.6)
Hemoglobin: 10.4 g/dL — ABNORMAL LOW (ref 11.6–15.9)
Lymphocytes Relative: 3 %
Lymphs Abs: 0.6 10*3/uL — ABNORMAL LOW (ref 0.9–3.3)
MCH: 32.8 pg (ref 25.1–34.0)
MCHC: 32.1 g/dL (ref 31.5–36.0)
MCV: 102.2 fL — ABNORMAL HIGH (ref 79.5–101.0)
MONO ABS: 2.3 10*3/uL — AB (ref 0.1–0.9)
MONOS PCT: 12 %
Neutro Abs: 16.7 10*3/uL — ABNORMAL HIGH (ref 1.5–6.5)
Neutrophils Relative %: 84 %
PLATELETS: 113 10*3/uL — AB (ref 145–400)
RBC: 3.17 MIL/uL — ABNORMAL LOW (ref 3.70–5.45)
RDW: 19.1 % — AB (ref 11.2–14.5)
WBC: 19.7 10*3/uL — ABNORMAL HIGH (ref 3.9–10.3)

## 2018-01-19 LAB — COMPREHENSIVE METABOLIC PANEL
ALBUMIN: 3.9 g/dL (ref 3.5–5.0)
ALK PHOS: 243 U/L — AB (ref 40–150)
ALT: 16 U/L (ref 0–55)
AST: 15 U/L (ref 5–34)
Anion gap: 12 — ABNORMAL HIGH (ref 3–11)
BUN: 30 mg/dL — AB (ref 7–26)
CALCIUM: 9.8 mg/dL (ref 8.4–10.4)
CO2: 25 mmol/L (ref 22–29)
CREATININE: 1.16 mg/dL — AB (ref 0.60–1.10)
Chloride: 105 mmol/L (ref 98–109)
GFR calc Af Amer: 56 mL/min — ABNORMAL LOW (ref >60)
GFR calc non Af Amer: 49 mL/min — ABNORMAL LOW (ref >60)
GLUCOSE: 129 mg/dL (ref 70–140)
Potassium: 4.6 mmol/L (ref 3.5–5.1)
Sodium: 142 mmol/L (ref 136–145)
TOTAL PROTEIN: 7.1 g/dL (ref 6.4–8.3)
Total Bilirubin: <0.2 mg/dL — ABNORMAL LOW (ref 0.2–1.2)

## 2018-01-19 LAB — SAMPLE TO BLOOD BANK

## 2018-01-19 NOTE — Progress Notes (Signed)
Hgb today 10.4. Patient provided copy of lab report from today. No transfusion needed. Patient reports feeling very good the past few days. Patient understands to call this office with any concerns or questions.

## 2018-01-21 ENCOUNTER — Other Ambulatory Visit: Payer: Self-pay | Admitting: *Deleted

## 2018-01-21 ENCOUNTER — Ambulatory Visit (HOSPITAL_COMMUNITY)
Admission: RE | Admit: 2018-01-21 | Discharge: 2018-01-21 | Disposition: A | Discharge status: Home / Self Care | Payer: BC Managed Care – PPO | Source: Ambulatory Visit | Attending: Hematology and Oncology | Admitting: Hematology and Oncology

## 2018-01-21 DIAGNOSIS — N200 Calculus of kidney: Secondary | ICD-10-CM | POA: Diagnosis not present

## 2018-01-21 DIAGNOSIS — C569 Malignant neoplasm of unspecified ovary: Secondary | ICD-10-CM | POA: Diagnosis present

## 2018-01-21 MED ORDER — IOPAMIDOL (ISOVUE-300) INJECTION 61%
100.0000 mL | Freq: Once | INTRAVENOUS | Status: AC | PRN
  Administered 2018-01-21: 100 mL via INTRAVENOUS

## 2018-01-21 MED ORDER — HEPARIN SOD (PORK) LOCK FLUSH 100 UNIT/ML IV SOLN
500.0000 [IU] | Freq: Once | INTRAVENOUS | Status: AC
  Administered 2018-01-21: 500 [IU] via INTRAVENOUS

## 2018-01-21 MED ORDER — IOPAMIDOL (ISOVUE-300) INJECTION 61%
INTRAVENOUS | Status: AC
  Administered 2018-01-21: 100 mL
  Filled 2018-01-21: qty 100

## 2018-01-21 MED ORDER — HEPARIN SOD (PORK) LOCK FLUSH 100 UNIT/ML IV SOLN
INTRAVENOUS | Status: AC
  Administered 2018-01-21: 500 [IU]
  Filled 2018-01-21: qty 5

## 2018-01-21 MED ORDER — GLUCOSE BLOOD VI STRP: ORAL_STRIP | 0 refills | Status: DC

## 2018-01-25 ENCOUNTER — Ambulatory Visit: Payer: BC Managed Care – PPO | Admitting: Hematology and Oncology

## 2018-01-25 ENCOUNTER — Other Ambulatory Visit: Payer: BC Managed Care – PPO

## 2018-01-25 ENCOUNTER — Emergency Department (HOSPITAL_COMMUNITY)
Admission: EM | Admit: 2018-01-25 | Discharge: 2018-01-26 | Disposition: A | Discharge status: Home / Self Care | Payer: BC Managed Care – PPO | Attending: Emergency Medicine | Admitting: Emergency Medicine

## 2018-01-25 ENCOUNTER — Other Ambulatory Visit: Payer: Self-pay | Admitting: Hematology and Oncology

## 2018-01-25 ENCOUNTER — Encounter (HOSPITAL_COMMUNITY): Payer: Self-pay

## 2018-01-25 ENCOUNTER — Telehealth: Payer: Self-pay | Admitting: *Deleted

## 2018-01-25 ENCOUNTER — Telehealth: Payer: Self-pay | Admitting: Hematology and Oncology

## 2018-01-25 ENCOUNTER — Other Ambulatory Visit: Payer: Self-pay

## 2018-01-25 DIAGNOSIS — N390 Urinary tract infection, site not specified: Secondary | ICD-10-CM | POA: Diagnosis not present

## 2018-01-25 DIAGNOSIS — Z79899 Other long term (current) drug therapy: Secondary | ICD-10-CM | POA: Insufficient documentation

## 2018-01-25 DIAGNOSIS — I10 Essential (primary) hypertension: Secondary | ICD-10-CM | POA: Insufficient documentation

## 2018-01-25 DIAGNOSIS — R197 Diarrhea, unspecified: Secondary | ICD-10-CM | POA: Diagnosis not present

## 2018-01-25 DIAGNOSIS — Z794 Long term (current) use of insulin: Secondary | ICD-10-CM | POA: Insufficient documentation

## 2018-01-25 DIAGNOSIS — Z8543 Personal history of malignant neoplasm of ovary: Secondary | ICD-10-CM | POA: Insufficient documentation

## 2018-01-25 DIAGNOSIS — E860 Dehydration: Secondary | ICD-10-CM | POA: Diagnosis not present

## 2018-01-25 DIAGNOSIS — E119 Type 2 diabetes mellitus without complications: Secondary | ICD-10-CM | POA: Diagnosis not present

## 2018-01-25 MED ORDER — SODIUM CHLORIDE 0.9 % IV BOLUS
1000.0000 mL | Freq: Once | INTRAVENOUS | Status: AC
  Administered 2018-01-26: 1000 mL via INTRAVENOUS

## 2018-01-25 NOTE — ED Notes (Signed)
Have paged lab to come draw blood work  

## 2018-01-25 NOTE — ED Triage Notes (Signed)
Pt reports highest fever at home was 101.9 and she has taken Tylenol. Last dose was at 1900.  Pt also reports diarrhea that started this morning (liquid stools x 5). Last episodes have been loose stools. Two doses of imodium given, last dose at [place]. Pt says diarrhea is improving, but she is worried about fever because she is an active chemo pt and her grandson was sent home with stomach virus today.

## 2018-01-25 NOTE — Telephone Encounter (Signed)
Pt reports diarrhea X 8 today, vomited X 2. Has appts this afternoon for lab and Dr Alvy Bimler. Does not feel she can drive this far for appts. States she is drinking well and has taken something for the diarrhea.  Dr Alvy Bimler will reschedule today and tomorrows aptps. Plan to see Dr Renold Don on Friday, then chemo on Monday. Notified that scans look better.  Message to scheduler

## 2018-01-25 NOTE — Telephone Encounter (Signed)
Scheduled appt per 3/25 sch msg - spoke with patient regarding appts that were added.

## 2018-01-26 ENCOUNTER — Ambulatory Visit: Payer: BC Managed Care – PPO

## 2018-01-26 ENCOUNTER — Telehealth: Payer: Self-pay | Admitting: *Deleted

## 2018-01-26 LAB — CBC WITH DIFFERENTIAL/PLATELET
BASOS ABS: 0 10*3/uL (ref 0.0–0.1)
Basophils Relative: 0 %
EOS PCT: 1 %
Eosinophils Absolute: 0.1 10*3/uL (ref 0.0–0.7)
HEMATOCRIT: 32.6 % — AB (ref 36.0–46.0)
Hemoglobin: 10.4 g/dL — ABNORMAL LOW (ref 12.0–15.0)
LYMPHS PCT: 5 %
Lymphs Abs: 0.8 10*3/uL (ref 0.7–4.0)
MCH: 33.2 pg (ref 26.0–34.0)
MCHC: 31.9 g/dL (ref 30.0–36.0)
MCV: 104.2 fL — AB (ref 78.0–100.0)
MONO ABS: 0.6 10*3/uL (ref 0.1–1.0)
Monocytes Relative: 4 %
Neutro Abs: 12.8 10*3/uL (ref 1.7–7.7)
Neutrophils Relative %: 90 %
Platelets: 84 10*3/uL — ABNORMAL LOW (ref 150–400)
RBC: 3.13 MIL/uL — ABNORMAL LOW (ref 3.87–5.11)
RDW: 21 % — AB (ref 11.5–15.5)
WBC: 14.2 10*3/uL — ABNORMAL HIGH (ref 4.0–10.5)

## 2018-01-26 LAB — COMPREHENSIVE METABOLIC PANEL
ALBUMIN: 3.9 g/dL (ref 3.5–5.0)
ALK PHOS: 148 U/L — AB (ref 38–126)
ALT: 17 U/L (ref 14–54)
AST: 20 U/L (ref 15–41)
Anion gap: 14 (ref 5–15)
BILIRUBIN TOTAL: 0.7 mg/dL (ref 0.3–1.2)
BUN: 41 mg/dL — AB (ref 6–20)
CO2: 22 mmol/L (ref 22–32)
Calcium: 9.1 mg/dL (ref 8.9–10.3)
Chloride: 102 mmol/L (ref 101–111)
Creatinine, Ser: 1.64 mg/dL — ABNORMAL HIGH (ref 0.44–1.00)
GFR calc Af Amer: 37 mL/min — ABNORMAL LOW (ref >60)
GFR calc non Af Amer: 32 mL/min — ABNORMAL LOW (ref >60)
GLUCOSE: 148 mg/dL — AB (ref 65–99)
POTASSIUM: 4 mmol/L (ref 3.5–5.1)
Sodium: 138 mmol/L (ref 135–145)
TOTAL PROTEIN: 6.8 g/dL (ref 6.5–8.1)

## 2018-01-26 LAB — URINALYSIS, ROUTINE W REFLEX MICROSCOPIC
BACTERIA UA: NONE SEEN
Bilirubin Urine: NEGATIVE
Glucose, UA: NEGATIVE mg/dL
Hgb urine dipstick: NEGATIVE
KETONES UR: NEGATIVE mg/dL
NITRITE: NEGATIVE
Protein, ur: NEGATIVE mg/dL
Specific Gravity, Urine: 1.025 (ref 1.005–1.030)
pH: 5 (ref 5.0–8.0)

## 2018-01-26 MED ORDER — CEPHALEXIN 500 MG PO CAPS: 500.0000 mg | ORAL_CAPSULE | Freq: Three times a day (TID) | ORAL | 0 refills | Status: DC

## 2018-01-26 MED ORDER — CEPHALEXIN 500 MG PO CAPS
500.0000 mg | ORAL_CAPSULE | Freq: Once | ORAL | Status: AC
  Administered 2018-01-26: 500 mg via ORAL
  Filled 2018-01-26: qty 1

## 2018-01-26 NOTE — Telephone Encounter (Signed)
Ellen Hunt left a message letting Dr Alvy Bimler know she went to the ED yesterday with diarrhea and elevated temp. Blood cultures, UA and culture were done- started on Keflex. Has appt with Dr Alvy Bimler on Friday

## 2018-01-26 NOTE — Discharge Instructions (Addendum)
Drink plenty of fluids (clear liquids) then start a bland diet later this morning such as toast, crackers, jello, [employer] chicken noodle soup. Use the zofran for nausea or vomiting. Take imodium OTC for diarrhea. Avoid milk products until the diarrhea is gone. Take the antibiotic until you get the results of your urine culture. I started you on this antibiotic based on your urine culture you had done in December, the urine sample you gave tonight had a lot of infection cells, but it also wasn't a "clean" sample, you didn't wipe well before getting the sample.   Recheck if you get a fever, uncontrolled vomiting or the diarrhea continue to get worse.

## 2018-01-26 NOTE — ED Notes (Signed)
Pt's fluids are done.

## 2018-01-26 NOTE — ED Provider Notes (Signed)
[facility] EMERGENCY DEPARTMENT Provider Note   CSN: [id number] Arrival date & time: 01/25/18  2005  Time seen 23:40 PM (not in room at 23:25 PM)  History   Chief Complaint Chief Complaint  Patient presents with  . Diarrhea    fever    HPI Ellen Hunt is a 67 y.o. female.  HPI patient has a history of ovarian cancer and states her last chemo was March 12.  She had blood work done March 19 and her white count was 19.7 and her hemoglobin was 10.4, platelets were 113,000.  She states she did get Neulasta after her chemotherapy.  She states she had a follow-up abdominal/pelvis CT scan done on March 21.  She states she was with her daughter and her to children which are 100 and 38 years old over the weekend and the 82-year-old had diarrhea once Saturday the 23rd, once Sunday the 24th, and mother got called today, March 25th, from daycare that he was having more diarrhea that the daughter described as explosive.  Patient states she woke up at 6:30 AM with watery dark diarrhea and had 4 episodes in 2 hours.  She states she has had a total of 7 or 8 episodes today.  She took Imodium at 11 AM and then again in the afternoon.  She states she feels bloated but denies abdominal pain.  She states when she has diarrhea the abdominal bloating is better.  She has had nausea and vomited twice earlier this morning after which she took Zofran which is helped with that.  She describes decreased urinary output.  She describes one episode of diarrhea in the ED that was yellow but still watery.  She states this afternoon she had a temperature of 100.7 however this evening it went up to 101.9.  She took Tylenol and then came to the ED.  She denies coughing, sore throat, or sneezing although she has had clear rhinorrhea.  She denies feeling dizzy or lightheaded.  She denies being on any antibiotics recently.  She denies any change in her diet.  Patient states her CBGs have been a little bit high because she could not  take her metformin for 2 days after her CT scan.  She also is on a insulin pump.  PCP Eustaquio Maize, MD Oncology Dr Alvy Bimler  Past Medical History:  Diagnosis Date  . Diabetes mellitus without complication (HCC)    on insulin pump  . Dysrhythmia   . 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 Family history of breast cancer   . GERD (gastroesophageal reflux disease)   . History of blood transfusion   . History of bronchitis   . History of chemotherapy   . History of urinary tract infection   . Hyperlipidemia   . Hypertension   . Low serum vitamin D   . Ovarian cancer ([place]) 05/09/2016  . Shingles     Patient Active Problem List   Diagnosis Date Noted  . 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 Elevated liver enzymes 08/25/2017  . Pancytopenia, acquired ([place]) 08/14/2017  . E-coli UTI 08/05/2017  . Thrombocytopenia ([place]) 08/04/2017  . Sepsis secondary to UTI ([place]) 08/04/2017  . AKI (acute kidney injury) ([place]) 08/04/2017  . Goals of care, counseling/discussion 05/14/2017  . Edema leg 12/18/2016  . Influenza with pneumonia 11/20/2016  . Sepsis ([place]) 11/20/2016  . Antineoplastic chemotherapy induced pancytopenia ([place]) 11/20/2016  . HCAP (healthcare-associated pneumonia) 11/19/2016  . Infection of urinary tract 10/14/2016  .  B12 deficiency 08/31/2016  . Anemia due to antineoplastic chemotherapy 08/09/2016  . 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 Glaucoma 08/07/2016  . Genetic testing 06/27/2016  . Family history of breast cancer   . Shingles 05/09/2016  . 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 Nausea with vomiting 04/30/2016  . Sciatica neuralgia 02/12/2015  . Severe obesity (BMI >= 40) ([place]) 10/12/2014  . Hypertension 09/04/2014  . Obesity 09/04/2014  . Type 2 diabetes mellitus with complication, with long term current use of insulin pump ([place]) 01/25/2014  . Vitamin D insufficiency 01/25/2014  .  Hyperlipidemia 01/25/2014  . Osteopenia 01/25/2014    Past Surgical History:  Procedure Laterality Date  . ABDOMINAL HYSTERECTOMY  10/14/2016  . CESAREAN SECTION    . DEBULKING N/A 10/14/2016   Procedure: DEBULKING;  Surgeon: Everitt Amber, MD;  Location: [facility] OR[facility];  Service: Gynecology;  Laterality: N/A;  . IR PARACENTESIS  06/01/2017  . LAPAROTOMY WITH STAGING N/A 10/14/2016   Procedure: LAPAROTOMY WITH OMENTECTOMY AND TUMOR DEBULGING;  Surgeon: Everitt Amber, MD;  Location: [facility];  Service: Gynecology;  Laterality: N/A;  . LUMBAR FUSION  08/21/15   L3-L4 Dr. Timmothy Euler  . OMENTECTOMY N/A 10/14/2016   Procedure: OMENTECTOMY;  Surgeon: Everitt Amber, MD;  Location: [facility];  Service: Gynecology;  Laterality: N/A;  . ROBOTIC ASSISTED TOTAL HYSTERECTOMY WITH BILATERAL SALPINGO OOPHERECTOMY Bilateral 10/14/2016   Procedure: XI ROBOTIC ASSISTED TOTAL LAPARSCOPIC  HYSTERECTOMY WITH BILATERAL SALPINGO OOPHORECTOMY;  Surgeon: Everitt Amber, MD;  Location: [facility];  Service: Gynecology;  Laterality: Bilateral;     OB History   None      Home Medications    Prior to Admission medications   Medication Sig Start Date End Date Taking? Authorizing Provider  atorvastatin (LIPITOR) 40 MG tablet TAKE 1 TABLET(40 MG) BY MOUTH DAILY 10/29/17   Philemon Kingdom, MD  benazepril (LOTENSIN) 10 MG tablet Take 10 mg by mouth daily.    [provider]  cephALEXin (KEFLEX) 500 MG capsule Take 1 capsule (500 mg total) by mouth 3 (three) times daily. 01/26/18   Rolland Porter, MD  furosemide (LASIX) 20 MG tablet TAKE 1 TABLET(20 MG) BY MOUTH THREE TIMES DAILY 01/19/18   Heath Lark, MD  gabapentin (NEURONTIN) 300 MG capsule Take 1 capsule (300 mg total) by mouth 3 (three) times daily. Patient not taking: Reported on 12/30/2017 11/10/17   Heath Lark, MD  glucose blood (ONE TOUCH ULTRA TEST) test strip USE TO CHECK BLOOD SUGAR UP TO 5 TIMES A DAY 01/21/18   Eustaquio Maize, MD  HYDROcodone-acetaminophen [facility]) 10-325  MG tablet Take 1 tablet by mouth every 4 (four) hours as needed. 01/12/18   Heath Lark, MD  insulin aspart (NOVOLOG) 100 UNIT/ML injection USE IN INSULIN PUMP AS DIRECTED. MAX DAILY DOSE OF 110 UNITS PER DAY 12/30/17   Philemon Kingdom, MD  lidocaine-prilocaine (EMLA) cream Apply a quarter size amount to port site 1 hour prior to chemo. Do not rub in. Cover with plastic wrap. 12/09/17   Heath Lark, MD  loperamide (IMODIUM A-D) 2 MG tablet Take 1 tablet (2 mg total) by mouth 4 (four) times daily as needed for diarrhea or loose stools. 08/05/17   Dhungel, Nishant, MD  magnesium oxide (MAG-OX) 400 (241.3 Mg) MG tablet Take 1 tablet (400 mg total) by mouth 2 (two) times daily. 01/12/18   Heath Lark, MD  metFORMIN (GLUCOPHAGE) 1000 MG tablet TAKE 1 TABLET BY MOUTH TWICE DAILY WITH A MEAL 12/30/17  Philemon Kingdom, MD  Multiple Vitamin (MULTIVITAMIN WITH MINERALS) TABS Take 1 tablet by mouth daily.    [provider]  multivitamin-lutein (OCUVITE-LUTEIN) CAPS capsule Take 1 capsule by mouth daily.    [provider]  omeprazole (PRILOSEC) 40 MG capsule Take 1 capsule (40 mg total) daily by mouth. 09/21/17   Heath Lark, MD  ondansetron (ZOFRAN) 8 MG tablet Take 1 tablet (8 mg total) by mouth every 8 (eight) hours as needed for nausea or vomiting. 10/19/17   Heath Lark, MD  polyethylene glycol (MIRALAX / GLYCOLAX) packet Take 17 g by mouth daily as needed for mild constipation.     [provider]  prochlorperazine (COMPAZINE) 10 MG tablet Take 1 tablet (10 mg total) by mouth every 6 (six) hours as needed for nausea or vomiting. 11/02/17   Nicholas Lose, MD  senna (SENOKOT) 8.6 MG TABS tablet Take 1 tablet (8.6 mg total) by mouth at bedtime as needed for moderate constipation. 10/15/16   Everitt Amber, MD  vitamin B-12 (CYANOCOBALAMIN) 1000 MCG tablet Take 1,000 mcg by mouth daily.    [provider]  vitamin E (VITAMIN E) 400 UNIT capsule Take 400 Units by mouth daily.     [provider]    Family History Family History  Problem Relation Age of Onset  . Lung cancer Mother        smoker; dx in her 33s  . Leukemia Father   . Diabetes Paternal Grandmother   . Heart attack Paternal Grandmother   . Diabetes Paternal Grandfather   . Breast cancer Paternal Aunt        dx in her 24s-30s  . Leukemia Paternal Uncle   . Heart attack Maternal Grandfather   . Breast cancer Cousin        maternal first cousin    Social History Social History   Tobacco Use  . Smoking status: Never Smoker  . Smokeless tobacco: Never Used  Substance Use Topics  . Alcohol use: No  . Drug use: No  retired Education officer, [employer]   Allergies   Avandia [rosiglitazone]; Micronase [glyburide]; and Actos [pioglitazone]   Review of Systems Review of Systems  All other systems reviewed and are negative.    Physical Exam Updated Vital Signs BP (!) 106/58 (BP Location: Right Arm)   Pulse 99   Temp 99.8 F (37.7 C) (Oral)   Resp 15   Ht 5\' 3"  (1.6 m)   Wt 95.3 kg (210 lb)   SpO2 93%   BMI 37.20 kg/m   Vital signs normal except borderline tachycardia   Physical Exam  Constitutional: She is oriented to person, place, and time. She appears well-developed and well-nourished.  Non-toxic appearance. She does not appear ill. No distress.  HENT:  Head: Normocephalic and atraumatic.  Right Ear: External ear normal.  Left Ear: External ear normal.  Nose: Nose normal. No mucosal edema or rhinorrhea.  Mouth/Throat: Mucous membranes are dry. No dental abscesses or uvula swelling.  Eyes: Pupils are equal, round, and reactive to light. Conjunctivae and EOM are normal.  Neck: Normal range of motion and full passive range of motion without pain. Neck supple.  Cardiovascular: Regular rhythm and normal heart sounds. Tachycardia present. Exam reveals no gallop and no friction rub.  No murmur heard. Pulmonary/Chest: Effort normal and breath sounds normal. No respiratory distress.  She has no wheezes. She has no rhonchi. She has no rales. She exhibits no tenderness and no crepitus.  Abdominal: Soft. Normal appearance  and bowel sounds are normal. She exhibits no distension. There is no tenderness. There is no rebound and no guarding.  Musculoskeletal: Normal range of motion. She exhibits no edema or tenderness.  Moves all extremities well.   Neurological: She is alert and oriented to person, place, and time. She has normal strength. No cranial nerve deficit.  Skin: Skin is warm, dry and intact. No rash noted. No erythema. No pallor.  Psychiatric: She has a normal mood and affect. Her speech is normal and behavior is normal. Her mood appears not anxious.  Nursing note and vitals reviewed.    ED Treatments / Results  Labs (all labs ordered are listed, but only abnormal results are displayed) Results for orders placed or performed during the hospital encounter of 01/25/18  Comprehensive metabolic panel  Result Value Ref Range   Sodium 138 135 - 145 mmol/L   Potassium 4.0 3.5 - 5.1 mmol/L   Chloride 102 101 - 111 mmol/L   CO2 22 22 - 32 mmol/L   Glucose, Bld 148 (H) 65 - 99 mg/dL   BUN 41 (H) 6 - 20 mg/dL   Creatinine, Ser 1.64 (H) 0.44 - 1.00 mg/dL   Calcium 9.1 8.9 - 10.3 mg/dL   Total Protein 6.8 6.5 - 8.1 g/dL   Albumin 3.9 3.5 - 5.0 g/dL   AST 20 15 - 41 U/L   ALT 17 14 - 54 U/L   Alkaline Phosphatase 148 (H) 38 - 126 U/L   Total Bilirubin 0.7 0.3 - 1.2 mg/dL   GFR calc non Af Amer 32 (L) >60 mL/min   GFR calc Af Amer 37 (L) >60 mL/min   Anion gap 14 5 - 15  CBC with Differential  Result Value Ref Range   WBC 14.2 (H) 4.0 - 10.5 K/uL   RBC 3.13 (L) 3.87 - 5.11 MIL/uL   Hemoglobin 10.4 (L) 12.0 - 15.0 g/dL   HCT 32.6 (L) 36.0 - 46.0 %   MCV 104.2 (H) 78.0 - 100.0 fL   MCH 33.2 26.0 - 34.0 pg   MCHC 31.9 30.0 - 36.0 g/dL   RDW 21.0 (H) 11.5 - 15.5 %   Platelets 84 (L) 150 - 400 K/uL   Neutrophils Relative % 90 %   Neutro Abs 12.8 1.7 - 7.7 K/uL    Lymphocytes Relative 5 %   Lymphs Abs 0.8 0.7 - 4.0 K/uL   Monocytes Relative 4 %   Monocytes Absolute 0.6 0.1 - 1.0 K/uL   Eosinophils Relative 1 %   Eosinophils Absolute 0.1 0.0 - 0.7 K/uL   Basophils Relative 0 %   Basophils Absolute 0.0 0.0 - 0.1 K/uL   WBC Morphology TOXIC GRANULATION   Urinalysis, Routine w reflex microscopic  Result Value Ref Range   Color, Urine YELLOW YELLOW   APPearance CLOUDY (A) CLEAR   Specific Gravity, Urine 1.025 1.005 - 1.030   pH 5.0 5.0 - 8.0   Glucose, UA NEGATIVE NEGATIVE mg/dL   Hgb urine dipstick NEGATIVE NEGATIVE   Bilirubin Urine NEGATIVE NEGATIVE   Ketones, ur NEGATIVE NEGATIVE mg/dL   Protein, ur NEGATIVE NEGATIVE mg/dL   Nitrite NEGATIVE NEGATIVE   Leukocytes, UA SMALL (A) NEGATIVE   RBC / HPF 0-5 0 - 5 RBC/hpf   WBC, UA TOO NUMEROUS TO COUNT 0 - 5 WBC/hpf   Bacteria, UA NONE SEEN NONE SEEN   Squamous Epithelial / LPF TOO NUMEROUS TO COUNT (A) NONE SEEN   Mucus PRESENT    Hyaline  Casts, UA PRESENT    Non Squamous Epithelial 0-5 (A) NONE SEEN   Laboratory interpretation all normal except possible UTI but contaminated sample, stable renal insufficiency   EKG None  Radiology No results found.   Ct Abdomen Pelvis W Contrast  Result Date: 01/21/2018 CLINICAL DATA:  Restaging ovarian cancer.  IMPRESSION: 1. Stable to slightly improved omental, gastrohepatic ligament and pelvic disease as detailed above. No new/progressive findings. 2. No acute abdominal or pelvic findings. 3. Stable lower pole left renal calculus. Electronically Signed   By: Marijo Sanes M.D.   On: 01/21/2018 14:21    Procedures Procedures (including critical care time)      Component 62mo ago  Specimen Description URINE, CATHETERIZED   Special Requests NONE   Culture Abnormal   >=100,000 COLONIES/mL ESCHERICHIA COLI  30,000 COLONIES/mL PSEUDOMONAS AERUGINOSA  SEE SEPARATE REPORT FOR ADDITIONAL SUSCEPTIBILITY TESTING  Performed at [facility],  [place] [address] [place], [place] [address]      Report Status 10/08/2017 FINAL   Organism ID, Bacteria PSEUDOMONAS AERUGINOSAAbnormal    Resulting Agency [facility]  Susceptibility    Pseudomonas aeruginosa    MIC    CEFEPIME 2 SENSITIVE  Sensitive    CEFTAZIDIME 4 SENSITIVE  Sensitive    CIPROFLOXACIN <=0.25 SENS... Sensitive    GENTAMICIN <=1 SENSITIVE  Sensitive    IMIPENEM 1 SENSITIVE  Sensitive    PIP/TAZO 8 SENSITIVE  Sensitive         Susceptibility Comments   Pseudomonas aeruginosa  30,000 COLONIES/mL PSEUDOMONAS AERUGINOSA      Specimen Collected: 09/30/17 21:48 Last Resulted: 10/08/17 15:58       Suscept Result 1 Escherichia coli   Comment: (NOTE)  Identification performed by account, not confirmed by this  laboratory.  Cefazolin with an MIC <=16 predicts susceptibility to the oral agents  cefaclor, cefdinir, cefpodoxime, cefprozil, cefuroxime, cephalexin,  and loracarbef when used for therapy of uncomplicated urinary tract  infections due to E. coli, Klebsiella pneumoniae, and Proteus  mirabilis.   Antimicrobial Suscept Comment   Comment: (NOTE)    ** S = Susceptible; I = Intermediate; R = Resistant **           P = Positive; N = Negative       MICS are expressed in micrograms per mL   Antibiotic         RSLT#1  RSLT#2  RSLT#3  RSLT#4  Amoxicillin/Clavulanic Acid  I  Ampicillin           R  Cefazolin           S  Cefepime            S  Ceftriaxone          S  Cefuroxime           S  Ciprofloxacin         R  Ertapenem           S  Gentamicin           S  Imipenem            S  Levofloxacin          R  Meropenem           S  Nitrofurantoin         S  Piperacillin/Tazobactam    S  Tetracycline          S  Tobramycin  S  Trimethoprim/Sulfa       R  Performed At: [facility]  [place], [place] [id number]  Rush Farmer MD SK:[id number]  Performed at [facility], [place] [address], [place], [place]  [address]      Medications Ordered in ED Medications  cephALEXin (KEFLEX) capsule 500 mg (has no administration in time range)  sodium chloride 0.9 % bolus 1,000 mL (1,000 mLs Intravenous Given 01/26/18 0011)  sodium chloride 0.9 % bolus 1,000 mL (1,000 mLs Intravenous Given 01/26/18 0011)     Initial Impression / Assessment and Plan / ED Course  I have reviewed the triage vital signs and the nursing notes.  Pertinent labs & imaging results that were available during my care of the patient were reviewed by me and considered in my medical decision making (see chart for details).    Pt appears to be dehydrated, her tongue is dry, she has borderline tachycardia and decreased urine output. She was given IV fluids. Blood cultures were obtained.    Patient's urine sample appears to be contaminated, there were too numerous to count epithelial cells in addition to too numerous to count white blood cells.  I looked at her last urine culture which was in December.  She had 2 organisms although E. coli was the predominant one.  The Pseudomonas was sensitive to Cipro however the E. coli was not.  It appeared to me that the Keflex would be the best choice for now until her culture results.  Patient was able to take the fluids and orally drink fluids and felt better at time of discharge.  She has not had any urinary output however she does not want to give another liter of fluid.  She states she will drink at home.  Final Clinical Impressions(s) / ED Diagnoses   Final diagnoses:  Diarrhea, unspecified type  Dehydration  Urinary tract infection without hematuria, site unspecified    ED Discharge Orders        Ordered    cephALEXin (KEFLEX) 500 MG capsule  3 times daily     01/26/18 0131    OTC imodium, pt already has zofran  Plan  discharge  Rolland Porter, MD, Barbette Or, MD 01/26/18 [phone]

## 2018-01-27 ENCOUNTER — Encounter: Payer: Self-pay | Admitting: Hematology and Oncology

## 2018-01-27 LAB — URINE CULTURE: SPECIAL REQUESTS: NORMAL

## 2018-01-28 ENCOUNTER — Telehealth: Payer: Self-pay

## 2018-01-28 ENCOUNTER — Inpatient Hospital Stay: Payer: BC Managed Care – PPO

## 2018-01-28 ENCOUNTER — Inpatient Hospital Stay (HOSPITAL_BASED_OUTPATIENT_CLINIC_OR_DEPARTMENT_OTHER): Payer: BC Managed Care – PPO | Admitting: Medical

## 2018-01-28 ENCOUNTER — Other Ambulatory Visit: Payer: Self-pay | Admitting: Hematology and Oncology

## 2018-01-28 VITALS — BP 152/46 | HR 92 | Temp 97.7°F | Resp 17 | Ht 63.0 in | Wt 216.2 lb

## 2018-01-28 DIAGNOSIS — Z801 Family history of malignant neoplasm of trachea, bronchus and lung: Secondary | ICD-10-CM

## 2018-01-28 DIAGNOSIS — N200 Calculus of kidney: Secondary | ICD-10-CM | POA: Diagnosis not present

## 2018-01-28 DIAGNOSIS — Z806 Family history of leukemia: Secondary | ICD-10-CM

## 2018-01-28 DIAGNOSIS — C481 Malignant neoplasm of specified parts of peritoneum: Secondary | ICD-10-CM

## 2018-01-28 DIAGNOSIS — Z90710 Acquired absence of both cervix and uterus: Secondary | ICD-10-CM

## 2018-01-28 DIAGNOSIS — E538 Deficiency of other specified B group vitamins: Secondary | ICD-10-CM | POA: Diagnosis not present

## 2018-01-28 DIAGNOSIS — E860 Dehydration: Secondary | ICD-10-CM | POA: Diagnosis not present

## 2018-01-28 DIAGNOSIS — Z9641 Presence of insulin pump (external) (internal): Secondary | ICD-10-CM

## 2018-01-28 DIAGNOSIS — N390 Urinary tract infection, site not specified: Principal | ICD-10-CM

## 2018-01-28 DIAGNOSIS — I10 Essential (primary) hypertension: Secondary | ICD-10-CM | POA: Diagnosis not present

## 2018-01-28 DIAGNOSIS — D6481 Anemia due to antineoplastic chemotherapy: Secondary | ICD-10-CM

## 2018-01-28 DIAGNOSIS — E119 Type 2 diabetes mellitus without complications: Secondary | ICD-10-CM

## 2018-01-28 DIAGNOSIS — E785 Hyperlipidemia, unspecified: Secondary | ICD-10-CM | POA: Diagnosis not present

## 2018-01-28 DIAGNOSIS — R197 Diarrhea, unspecified: Secondary | ICD-10-CM

## 2018-01-28 DIAGNOSIS — Z90722 Acquired absence of ovaries, bilateral: Secondary | ICD-10-CM | POA: Diagnosis not present

## 2018-01-28 DIAGNOSIS — T451X5A Adverse effect of antineoplastic and immunosuppressive drugs, initial encounter: Secondary | ICD-10-CM

## 2018-01-28 DIAGNOSIS — B9620 Unspecified Escherichia coli [E. coli] as the cause of diseases classified elsewhere: Secondary | ICD-10-CM

## 2018-01-28 DIAGNOSIS — Z803 Family history of malignant neoplasm of breast: Secondary | ICD-10-CM

## 2018-01-28 DIAGNOSIS — N179 Acute kidney failure, unspecified: Secondary | ICD-10-CM | POA: Diagnosis not present

## 2018-01-28 LAB — COMPREHENSIVE METABOLIC PANEL
ALBUMIN: 3.5 g/dL (ref 3.5–5.0)
ALT: 18 U/L (ref 0–55)
AST: 19 U/L (ref 5–34)
Alkaline Phosphatase: 119 U/L (ref 40–150)
Anion gap: 10 (ref 3–11)
BUN: 16 mg/dL (ref 7–26)
CHLORIDE: 106 mmol/L (ref 98–109)
CO2: 22 mmol/L (ref 22–29)
Calcium: 8.9 mg/dL (ref 8.4–10.4)
Creatinine, Ser: 0.9 mg/dL (ref 0.60–1.10)
GFR calc Af Amer: >60 mL/min (ref >60)
GLUCOSE: 84 mg/dL (ref 70–140)
Potassium: 3.8 mmol/L (ref 3.5–5.1)
SODIUM: 138 mmol/L (ref 136–145)
Total Bilirubin: 0.3 mg/dL (ref 0.2–1.2)
Total Protein: 6.4 g/dL (ref 6.4–8.3)

## 2018-01-28 LAB — CBC WITH DIFFERENTIAL/PLATELET
BASOS ABS: 0 10*3/uL (ref 0.0–0.1)
BASOS PCT: 0 %
EOS ABS: 0.2 10*3/uL (ref 0.0–0.5)
EOS PCT: 2 %
HCT: 27 % — ABNORMAL LOW (ref 34.8–46.6)
Hemoglobin: 9 g/dL — ABNORMAL LOW (ref 11.6–15.9)
Lymphocytes Relative: 6 %
Lymphs Abs: 0.5 10*3/uL — ABNORMAL LOW (ref 0.9–3.3)
MCH: 33.6 pg (ref 25.1–34.0)
MCHC: 33.3 g/dL (ref 31.5–36.0)
MCV: 100.7 fL (ref 79.5–101.0)
MONO ABS: 0.9 10*3/uL (ref 0.1–0.9)
Monocytes Relative: 12 %
Neutro Abs: 5.9 10*3/uL (ref 1.5–6.5)
Neutrophils Relative %: 80 %
Platelets: 113 10*3/uL — ABNORMAL LOW (ref 145–400)
RBC: 2.68 MIL/uL — ABNORMAL LOW (ref 3.70–5.45)
RDW: 22.1 % — AB (ref 11.2–14.5)
WBC: 7.4 10*3/uL (ref 3.9–10.3)

## 2018-01-28 LAB — SAMPLE TO BLOOD BANK

## 2018-01-28 LAB — MAGNESIUM: Magnesium: 1.8 mg/dL (ref 1.5–2.5)

## 2018-01-28 MED ORDER — HEPARIN SOD (PORK) LOCK FLUSH 100 UNIT/ML IV SOLN
500.0000 [IU] | Freq: Once | INTRAVENOUS | Status: AC
  Administered 2018-01-28: 500 [IU]
  Filled 2018-01-28: qty 5

## 2018-01-28 MED ORDER — SODIUM CHLORIDE 0.9% FLUSH
10.0000 mL | Freq: Once | INTRAVENOUS | Status: AC
  Administered 2018-01-28: 10 mL
  Filled 2018-01-28: qty 10

## 2018-01-28 NOTE — Progress Notes (Signed)
No need for IV fluids today per Sandi Mealy, PA.  [place] accessed and pt discharged home.

## 2018-01-28 NOTE — Patient Instructions (Signed)
Dehydration, Adult Dehydration is a condition in which there is not enough fluid or water in the body. This happens when you lose more fluids than you take in. Important organs, such as the kidneys, brain, and heart, cannot function without a proper amount of fluids. Any loss of fluids from the body can lead to dehydration. Dehydration can range from mild to severe. This condition should be treated right away to prevent it from becoming severe. What are the causes? This condition may be caused by:  Vomiting.  Diarrhea.  Excessive sweating, such as from heat exposure or exercise.  Not drinking enough fluid, especially: ? When ill. ? While doing activity that requires a lot of energy.  Excessive urination.  Fever.  Infection.  Certain medicines, such as medicines that cause the body to lose excess fluid (diuretics).  Inability to access safe drinking water.  Reduced physical ability to get adequate water and food.  What increases the risk? This condition is more likely to develop in people:  Who have a poorly controlled long-term (chronic) illness, such as diabetes, heart disease, or kidney disease.  Who are age 65 or older.  Who are disabled.  Who live in a place with high altitude.  Who play endurance sports.  What are the signs or symptoms? Symptoms of mild dehydration may include:  Thirst.  Dry lips.  Slightly dry mouth.  Dry, warm skin.  Dizziness. Symptoms of moderate dehydration may include:  Very dry mouth.  Muscle cramps.  Dark urine. Urine may be the color of tea.  Decreased urine production.  Decreased tear production.  Heartbeat that is irregular or faster than normal (palpitations).  Headache.  Light-headedness, especially when you stand up from a sitting position.  Fainting (syncope). Symptoms of severe dehydration may include:  Changes in skin, such as: ? Cold and clammy skin. ? Blotchy (mottled) or pale skin. ? Skin that does  not quickly return to normal after being lightly pinched and released (poor skin turgor).  Changes in body fluids, such as: ? Extreme thirst. ? No tear production. ? Inability to sweat when body temperature is high, such as in hot weather. ? Very little urine production.  Changes in vital signs, such as: ? Weak pulse. ? Pulse that is more than 100 beats a minute when sitting still. ? Rapid breathing. ? Low blood pressure.  Other changes, such as: ? Sunken eyes. ? Cold hands and feet. ? Confusion. ? Lack of energy (lethargy). ? Difficulty waking up from sleep. ? Short-term weight loss. ? Unconsciousness. How is this diagnosed? This condition is diagnosed based on your symptoms and a physical exam. Blood and urine tests may be done to help confirm the diagnosis. How is this treated? Treatment for this condition depends on the severity. Mild or moderate dehydration can often be treated at home. Treatment should be started right away. Do not wait until dehydration becomes severe. Severe dehydration is an emergency and it needs to be treated in a hospital. Treatment for mild dehydration may include:  Drinking more fluids.  Replacing salts and minerals in your blood (electrolytes) that you may have lost. Treatment for moderate dehydration may include:  Drinking an oral rehydration solution (ORS). This is a drink that helps you replace fluids and electrolytes (rehydrate). It can be found at pharmacies and retail stores. Treatment for severe dehydration may include:  Receiving fluids through an IV tube.  Receiving an electrolyte solution through a feeding tube that is passed through your nose   and into your stomach (nasogastric tube, or NG tube).  Correcting any abnormalities in electrolytes.  Treating the underlying cause of dehydration. Follow these instructions at home:  If directed by your health care provider, drink an ORS: ? Make an ORS by following instructions on the  package. ? Start by drinking small amounts, about  cup (120 mL) every 5-10 minutes. ? Slowly increase how much you drink until you have taken the amount recommended by your health care provider.  Drink enough clear fluid to keep your urine clear or pale yellow. If you were told to drink an ORS, finish the ORS first, then start slowly drinking other clear fluids. Drink fluids such as: ? Water. Do not drink only water. Doing that can lead to having too little salt (sodium) in the body (hyponatremia). ? Ice chips. ? Fruit juice that you have added water to (diluted fruit juice). ? Low-calorie sports drinks.  Avoid: ? Alcohol. ? Drinks that contain a lot of sugar. These include high-calorie sports drinks, fruit juice that is not diluted, and soda. ? Caffeine. ? Foods that are greasy or contain a lot of fat or sugar.  Take over-the-counter and prescription medicines only as told by your health care provider.  Do not take sodium tablets. This can lead to having too much sodium in the body (hypernatremia).  Eat foods that contain a healthy balance of electrolytes, such as bananas, oranges, potatoes, tomatoes, and spinach.  Keep all follow-up visits as told by your health care provider. This is important. Contact a health care provider if:  You have abdominal pain that: ? Gets worse. ? Stays in one area (localizes).  You have a rash.  You have a stiff neck.  You are more irritable than usual.  You are sleepier or more difficult to wake up than usual.  You feel weak or dizzy.  You feel very thirsty.  You have urinated only a small amount of very dark urine over 6-8 hours. Get help right away if:  You have symptoms of severe dehydration.  You cannot drink fluids without vomiting.  Your symptoms get worse with treatment.  You have a fever.  You have a severe headache.  You have vomiting or diarrhea that: ? Gets worse. ? Does not go away.  You have blood or green matter  (bile) in your vomit.  You have blood in your stool. This may cause stool to look black and tarry.  You have not urinated in 6-8 hours.  You faint.  Your heart rate while sitting still is over 100 beats a minute.  You have trouble breathing. This information is not intended to replace advice given to you by your health care provider. Make sure you discuss any questions you have with your health care provider. Document Released: 10/20/2005 Document Revised: 05/16/2016 Document Reviewed: 12/14/2015 Elsevier Interactive Patient Education  2018 [employer]  

## 2018-01-28 NOTE — Telephone Encounter (Signed)
Called regarding mychart message. Scheduling message sent for appt for labs at 10 am, flush, see Sandi Mealy and infusion for IV fluids today. Verbalized understanding.

## 2018-01-29 ENCOUNTER — Telehealth: Payer: Self-pay

## 2018-01-29 ENCOUNTER — Telehealth: Payer: Self-pay | Admitting: Hematology and Oncology

## 2018-01-29 ENCOUNTER — Ambulatory Visit: Payer: BC Managed Care – PPO | Admitting: Hematology and Oncology

## 2018-01-29 ENCOUNTER — Other Ambulatory Visit: Payer: BC Managed Care – PPO

## 2018-01-29 LAB — URINE CULTURE: CULTURE: NO GROWTH

## 2018-01-29 NOTE — Telephone Encounter (Signed)
Called with below message, verbalized understanding. 

## 2018-01-29 NOTE — Telephone Encounter (Signed)
Spoke to patient regarding upcoming April appointments per 3/28 sch message

## 2018-01-29 NOTE — Telephone Encounter (Signed)
-----   Message from Heath Lark, MD sent at 01/29/2018  1:27 PM EDT ----- Regarding: urine culture clear PLs let her know urine culture is clear ----- Message ----- From: Interface, Lab In [place] Sent: 12-Feb-202019  10:21 AM To: Heath Lark, MD

## 2018-01-29 NOTE — Progress Notes (Signed)
[facility] Progress Note   Ellen Hunt [id number] [birth year] 67 y.o.  Ellen Hunt is managed by Dr. Heath Lark  Actively treated with chemotherapy: yes  Current Therapy: Cisplatin and gemcitabine  Last Treated: 01/12/2018  Assessment: Plan:    B12 deficiency - Plan: heparin lock flush 100 unit/mL, sodium chloride flush (NS) 0.9 % injection 10 mL  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HCC) - Plan: heparin lock flush 100 unit/mL, sodium chloride flush (NS) 0.9 % injection 10 mL  AKI (acute kidney injury) ([place])  Diarrhea, unspecified type   Extraovarian primary peritoneal carcinoma: The patient is status post cisplatin and gemcitabine which was dosed on 01/12/2018.  She will return for treatment only on 02/01/2018 and then will see Dr. Heath Lark in follow-up on 02/08/2018.  Acute kidney injury: The patient was seen in the emergency room on 01/24/2018 for diarrhea, nausea and vomiting.  Her creatinine returned at 1.64 with a BUN at 41 at that time.  Her repeat labs today show normalization of these results with a BUN of 16 and creatinine of 0.9.  Her appointment previously scheduled for tomorrow will be canceled.  She will return as noted above.  Diarrhea: The patient's diarrhea has resolved.  She states that she was contacted by her daughter with the patient's daughter stating that the patient's grandson also developed diarrhea after seeing the patient.  Patient is instructed to continue to push fluids and return as needed.  Please see After Visit Summary for patient specific instructions.  Future Appointments  Date Time Provider [place]  02/01/2018  9:00 AM [facility] E14 [facility] None  02/08/2018  7:45 AM [facility] LAB 6 [facility] None  02/08/2018  8:00 AM [facility] I27 DNS [facility] None  02/08/2018  8:30 AM Alvy Bimler, Ni, MD [facility] None  02/08/2018  9:00 AM [facility] H28 [facility] None  02/15/2018  8:00 AM [facility] LAB 6  [facility] None  02/15/2018  8:15 AM [facility] FLUSH NURSE [facility] None  02/15/2018  9:15 AM [facility] B4 [facility] None  04/02/2018  9:15 AM Philemon Kingdom, MD [facility] None    No orders of the defined types were placed in this encounter.      Subjective:   Patient ID:  Ellen Hunt is a 67 y.o. (DOB Jan 15, 1951) female.  Chief Complaint:  Chief Complaint  Patient presents with  . Dehydration    HPI Ellen Hunt is a 67 year old female with a diagnosis of an extraovarian primary peritoneal carcinoma who was recently treated with cisplatin and gemcitabine on 01/12/2018.  The patient most recently developed diarrhea, nausea, and vomiting and was seen at the emergency room at [facility] on 01/24/2018.  She was found to have a creatinine elevated at 1.64 and a BUN at 41 at that time.  The patient reports that while she was in the emergency room she received a call from her daughter stating that the patient's grandson had developed similar symptoms of diarrhea and GI upset.  The patient had recently visited with her grandson.  She presents to the office today for follow-up of these events.  She states that her diarrhea has resolved.  She is not having any additional nausea or vomiting.  Her labs from today show that her kidney function has normalized with her creatinine at 0.9 and her BUN at 16.  She had been scheduled to return tomorrow for labs and was to receive IV fluids if needed today.  Medications: I have reviewed the patient's current medications.  Allergies:  Allergies  Allergen Reactions  . Avandia [Rosiglitazone] Other (See Comments)    Legs swelled  . Micronase [Glyburide] Swelling  . Actos [Pioglitazone] Other (See Comments)    Edema / leg swelling    Past Medical History:  Diagnosis Date  . Diabetes mellitus without complication (HCC)    on insulin pump  . Dysrhythmia   . 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[place]) 05/09/2016  . Family  history of breast cancer   . GERD (gastroesophageal reflux disease)   . History of blood transfusion   . History of bronchitis   . History of chemotherapy   . History of urinary tract infection   . Hyperlipidemia   . Hypertension   . Low serum vitamin D   . Ovarian cancer ([place]) 05/09/2016  . Shingles     Past Surgical History:  Procedure Laterality Date  . ABDOMINAL HYSTERECTOMY  10/14/2016  . CESAREAN SECTION    . DEBULKING N/A 10/14/2016   Procedure: DEBULKING;  Surgeon: Everitt Amber, MD;  Location: [facility];  Service: Gynecology;  Laterality: N/A;  . IR PARACENTESIS  06/01/2017  . LAPAROTOMY WITH STAGING N/A 10/14/2016   Procedure: LAPAROTOMY WITH OMENTECTOMY AND TUMOR DEBULGING;  Surgeon: Everitt Amber, MD;  Location: [facility];  Service: Gynecology;  Laterality: N/A;  . LUMBAR FUSION  08/21/15   L3-L4 Dr. Timmothy Euler  . OMENTECTOMY N/A 10/14/2016   Procedure: OMENTECTOMY;  Surgeon: Everitt Amber, MD;  Location: [facility];  Service: Gynecology;  Laterality: N/A;  . ROBOTIC ASSISTED TOTAL HYSTERECTOMY WITH BILATERAL SALPINGO OOPHERECTOMY Bilateral 10/14/2016   Procedure: XI ROBOTIC ASSISTED TOTAL LAPARSCOPIC  HYSTERECTOMY WITH BILATERAL SALPINGO OOPHORECTOMY;  Surgeon: Everitt Amber, MD;  Location: [facility];  Service: Gynecology;  Laterality: Bilateral;    Family History  Problem Relation Age of Onset  . Lung cancer Mother        smoker; dx in her 67s  . Leukemia Father   . Diabetes Paternal Grandmother   . Heart attack Paternal Grandmother   . Diabetes Paternal Grandfather   . Breast cancer Paternal Aunt        dx in her 22s-30s  . Leukemia Paternal Uncle   . Heart attack Maternal Grandfather   . Breast cancer Cousin        maternal first cousin    Social History   Socioeconomic History  . Marital status: Married    Spouse name: Gershon Mussel  . Number of children: 2  . Years of education: Not on file  . Highest education level: Not on file  Occupational History  . Occupation: retired    Scientific laboratory technician  . Financial resource strain: Not on file  . Food insecurity:    Worry: Not on file    Inability: Not on file  . Transportation needs:    Medical: Not on file    Non-medical: Not on file  Tobacco Use  . Smoking status: Never Smoker  . Smokeless tobacco: Never Used  Substance and Sexual Activity  . Alcohol use: No  . Drug use: No  . Sexual activity: Not on file    Comment: married  Lifestyle  . Physical activity:    Days per week: Not on file    Minutes per session: Not on file  . Stress: Not on file  Relationships  . Social connections:    Talks on phone: Not on file    Gets together: Not on file    Attends religious service: Not on file    Active member of  club or organization: Not on file    Attends meetings of clubs or organizations: Not on file    Relationship status: Not on file  . Intimate partner violence:    Fear of current or ex partner: Not on file    Emotionally abused: Not on file    Physically abused: Not on file    Forced sexual activity: Not on file  Other Topics Concern  . Not on file  Social History Narrative  . Not on file    Past Medical History, Surgical history, Social history, and Family history were reviewed and updated as appropriate.   Please see review of systems for further details on the patient's review from today.   Review of Systems:  Review of Systems  Constitutional: Negative for appetite change, chills, diaphoresis and fever.  Respiratory: Negative for cough, chest tightness and shortness of breath.   Cardiovascular: Negative for chest pain, palpitations and leg swelling.  Gastrointestinal: Positive for diarrhea, nausea and vomiting. Negative for abdominal distention, abdominal pain, blood in stool and constipation.  Genitourinary: Negative for decreased urine volume and difficulty urinating.  Neurological: Negative for weakness.    Objective:   Physical Exam:  BP (!) 152/46 (BP Location: Right Arm, Patient  Position: Sitting) Comment: told rn  Pulse 92   Temp 97.7 F (36.5 C) (Oral)   Resp 17   Ht 5\' 3"  (1.6 m)   Wt 216 lb 3.2 oz (98.1 kg)   SpO2 100%   BMI 38.30 kg/m  ECOG: 0  Physical Exam  Constitutional: No distress.  HENT:  Head: Normocephalic and atraumatic.  Mouth/Throat: Oropharynx is clear and moist.  Cardiovascular: Normal rate, regular rhythm and normal heart sounds. Exam reveals no gallop and no friction rub.  No murmur heard. Pulmonary/Chest: Effort normal and breath sounds normal. No respiratory distress. She has no wheezes. She has no rales.  Abdominal: Soft. Bowel sounds are normal. She exhibits no distension. There is no tenderness. There is no rebound and no guarding.  Musculoskeletal: She exhibits no edema.  Neurological: She is alert. Coordination normal.  Skin: Skin is warm and dry. She is not diaphoretic.    Lab Review:     Component Value Date/Time   NA 138 Sep 14, 202019 0956   NA 139 11/02/2017 0756   K 3.8 Sep 14, 202019 0956   K 4.7 11/02/2017 0756   CL 106 Sep 14, 202019 0956   CO2 22 Sep 14, 202019 0956   CO2 26 11/02/2017 0756   GLUCOSE 84 Sep 14, 202019 0956   GLUCOSE 140 11/02/2017 0756   BUN 16 Sep 14, 202019 0956   BUN 23.7 11/02/2017 0756   CREATININE 0.90 Sep 14, 202019 0956   CREATININE 0.9 11/02/2017 0756   CALCIUM 8.9 Sep 14, 202019 0956   CALCIUM 9.4 11/02/2017 0756   PROT 6.4 Sep 14, 202019 0956   PROT 7.0 11/02/2017 0756   ALBUMIN 3.5 Sep 14, 202019 0956   ALBUMIN 3.5 11/02/2017 0756   AST 19 Sep 14, 202019 0956   AST 18 11/02/2017 0756   ALT 18 Sep 14, 202019 0956   ALT 23 11/02/2017 0756   ALKPHOS 119 Sep 14, 202019 0956   ALKPHOS 108 11/02/2017 0756   BILITOT 0.3 Sep 14, 202019 0956   BILITOT 0.24 11/02/2017 0756   GFRNONAA >60 Sep 14, 202019 0956   GFRAA >60 Sep 14, 202019 0956       Component Value Date/Time   WBC 7.4 Sep 14, 202019 0956   RBC 2.68 (L) Sep 14, 202019 0956   HGB 9.0 (L) Sep 14, 202019 0956   HGB 9.6 (L) 11/02/2017 0756   HCT 27.0 (L) Sep 14, 202019 2836  HCT 30.6 (L)  11/02/2017 0756   PLT 113 (L) January 10, 202019 0956   PLT 150 11/02/2017 0756   PLT 40 (LL) 11/18/2016 1642   MCV 100.7 January 10, 202019 0956   MCV 108.5 (H) 11/02/2017 0756   MCH 33.6 January 10, 202019 0956   MCHC 33.3 January 10, 202019 0956   RDW 22.1 (H) January 10, 202019 0956   RDW 17.8 (H) 11/02/2017 0756   LYMPHSABS 0.5 (L) January 10, 202019 0956   LYMPHSABS 0.4 (L) 11/02/2017 0756   MONOABS 0.9 January 10, 202019 0956   MONOABS 0.5 11/02/2017 0756   EOSABS 0.2 January 10, 202019 0956   EOSABS 0.7 (H) 11/02/2017 0756   EOSABS 0.1 11/18/2016 1642   BASOSABS 0.0 January 10, 202019 0956   BASOSABS 0.0 11/02/2017 0756   -------------------------------  Imaging from last 24 hours (if applicable):  Radiology interpretation: Ct Abdomen Pelvis W Contrast  Result Date: 01/21/2018 CLINICAL DATA:  Restaging ovarian cancer. EXAM: CT ABDOMEN AND PELVIS WITH CONTRAST TECHNIQUE: Multidetector CT imaging of the abdomen and pelvis was performed using the standard protocol following bolus administration of intravenous contrast. CONTRAST:  175mL ISOVUE-300 IOPAMIDOL (ISOVUE-300) INJECTION 61% COMPARISON:  10/09/2017 FINDINGS: Lower chest: The lung bases are clear. No pulmonary lesions or pleural effusion. The heart is normal in size. No pericardial effusion. Hepatobiliary: No focal hepatic lesions or evidence of peritoneal surface disease involving the liver. No perihepatic fluid collections. The gallbladder is normal. No common bile duct dilatation. Pancreas: No mass, inflammation or ductal dilatation. Moderate pancreatic atrophy. Spleen: Borderline splenic enlargement and small calcified granulomas. Adrenals/Urinary Tract: The adrenal glands and kidneys are unremarkable and stable. Lower pole left renal calculus is again demonstrated. Stomach/Bowel: The stomach, duodenum, small bowel and colon are grossly normal. No acute inflammatory changes, mass lesions or obstructive findings. Vascular/Lymphatic: Stable aortic and iliac artery calcifications. No aneurysm. No  mesenteric or retroperitoneal lymphadenopathy. Persistent omental disease which appears fairly stable. 4.0 x 1.7 cm lesion on image number 29 is stable. Anterior lesion in the midline appears stable. No progressive findings are identified. Stable soft tissue mass in the gastrohepatic ligament area measures 3.2 x 2.2 cm on image number 25 and measures 3.7 x 2.5 cm on the prior study. In the pelvis there is a stable irregular soft tissue mass adjacent to the sigmoid colon with some surrounding fluid. This measures approximately 4.7 x 2.9 cm and previously measured 5.3 x 3.6 cm. Reproductive: Surgically absent. Other: No abdominal wall hernia or subcutaneous lesions. Musculoskeletal: No significant bony findings. IMPRESSION: 1. Stable to slightly improved omental, gastrohepatic ligament and pelvic disease as detailed above. No new/progressive findings. 2. No acute abdominal or pelvic findings. 3. Stable lower pole left renal calculus. Electronically Signed   By: Marijo Sanes M.D.   On: 01/21/2018 14:21        This case was discussed with Dr. Alvy Bimler. She expresses agreement with my management of this patient.

## 2018-01-31 LAB — CULTURE, BLOOD (ROUTINE X 2)
CULTURE: NO GROWTH
Culture: NO GROWTH
SPECIAL REQUESTS: ADEQUATE
Special Requests: ADEQUATE

## 2018-02-01 ENCOUNTER — Inpatient Hospital Stay: Payer: BC Managed Care – PPO | Attending: Hematology and Oncology

## 2018-02-01 DIAGNOSIS — R5383 Other fatigue: Secondary | ICD-10-CM | POA: Diagnosis not present

## 2018-02-01 DIAGNOSIS — Z9641 Presence of insulin pump (external) (internal): Secondary | ICD-10-CM | POA: Diagnosis not present

## 2018-02-01 DIAGNOSIS — T451X5S Adverse effect of antineoplastic and immunosuppressive drugs, sequela: Secondary | ICD-10-CM | POA: Insufficient documentation

## 2018-02-01 DIAGNOSIS — E1165 Type 2 diabetes mellitus with hyperglycemia: Secondary | ICD-10-CM | POA: Diagnosis not present

## 2018-02-01 DIAGNOSIS — Z90722 Acquired absence of ovaries, bilateral: Secondary | ICD-10-CM | POA: Diagnosis not present

## 2018-02-01 DIAGNOSIS — I700 Atherosclerosis of aorta: Secondary | ICD-10-CM | POA: Insufficient documentation

## 2018-02-01 DIAGNOSIS — D6481 Anemia due to antineoplastic chemotherapy: Secondary | ICD-10-CM | POA: Insufficient documentation

## 2018-02-01 DIAGNOSIS — Z79899 Other long term (current) drug therapy: Secondary | ICD-10-CM | POA: Diagnosis not present

## 2018-02-01 DIAGNOSIS — C481 Malignant neoplasm of specified parts of peritoneum: Secondary | ICD-10-CM | POA: Insufficient documentation

## 2018-02-01 DIAGNOSIS — G893 Neoplasm related pain (acute) (chronic): Secondary | ICD-10-CM | POA: Diagnosis not present

## 2018-02-01 DIAGNOSIS — Z90710 Acquired absence of both cervix and uterus: Secondary | ICD-10-CM | POA: Insufficient documentation

## 2018-02-01 MED ORDER — SODIUM CHLORIDE 0.9 % IV SOLN
Freq: Once | INTRAVENOUS | Status: AC
  Administered 2018-02-01: 11:00:00 via INTRAVENOUS

## 2018-02-01 MED ORDER — PALONOSETRON HCL INJECTION 0.25 MG/5ML
INTRAVENOUS | Status: AC
  Filled 2018-02-01: qty 5

## 2018-02-01 MED ORDER — SODIUM CHLORIDE 0.9% FLUSH
10.0000 mL | INTRAVENOUS | Status: DC | PRN
  Administered 2018-02-01: 10 mL
  Filled 2018-02-01: qty 10

## 2018-02-01 MED ORDER — POTASSIUM CHLORIDE 2 MEQ/ML IV SOLN
Freq: Once | INTRAVENOUS | Status: AC
  Administered 2018-02-01: 10:00:00 via INTRAVENOUS
  Filled 2018-02-01: qty 10

## 2018-02-01 MED ORDER — CISPLATIN CHEMO INJECTION 100MG/100ML
50.0000 mg | Freq: Once | INTRAVENOUS | Status: AC
  Administered 2018-02-01: 50 mg via INTRAVENOUS
  Filled 2018-02-01: qty 50

## 2018-02-01 MED ORDER — HEPARIN SOD (PORK) LOCK FLUSH 100 UNIT/ML IV SOLN
500.0000 [IU] | Freq: Once | INTRAVENOUS | Status: AC | PRN
  Administered 2018-02-01: 500 [IU]
  Filled 2018-02-01: qty 5

## 2018-02-01 MED ORDER — SODIUM CHLORIDE 0.9 % IV SOLN
500.0000 mg/m2 | Freq: Once | INTRAVENOUS | Status: AC
  Administered 2018-02-01: 1026 mg via INTRAVENOUS
  Filled 2018-02-01: qty 27

## 2018-02-01 MED ORDER — SODIUM CHLORIDE 0.9 % IV SOLN
Freq: Once | INTRAVENOUS | Status: AC
  Administered 2018-02-01: 11:00:00 via INTRAVENOUS
  Filled 2018-02-01: qty 5

## 2018-02-01 MED ORDER — PALONOSETRON HCL INJECTION 0.25 MG/5ML
0.2500 mg | Freq: Once | INTRAVENOUS | Status: AC
  Administered 2018-02-01: 0.25 mg via INTRAVENOUS

## 2018-02-01 NOTE — Patient Instructions (Signed)
[facility] Discharge Instructions for Patients Receiving Chemotherapy  Today you received the following chemotherapy agents:  Gemzar, Cisplatin  To help prevent nausea and vomiting after your treatment, we encourage you to take your nausea medication as prescribed.   If you develop nausea and vomiting that is not controlled by your nausea medication, call the clinic.   BELOW ARE SYMPTOMS THAT SHOULD BE REPORTED IMMEDIATELY:  *FEVER GREATER THAN 100.5 F  *CHILLS WITH OR WITHOUT FEVER  NAUSEA AND VOMITING THAT IS NOT CONTROLLED WITH YOUR NAUSEA MEDICATION  *UNUSUAL SHORTNESS OF BREATH  *UNUSUAL BRUISING OR BLEEDING  TENDERNESS IN MOUTH AND THROAT WITH OR WITHOUT PRESENCE OF ULCERS  *URINARY PROBLEMS  *BOWEL PROBLEMS  UNUSUAL RASH Items with * indicate a potential emergency and should be followed up as soon as possible.  Feel free to call the clinic should you have any questions or concerns. The clinic phone number is [phone].  Please show the CHEMO ALERT CARD at check-in to the Emergency Department and triage nurse.   

## 2018-02-08 ENCOUNTER — Inpatient Hospital Stay: Payer: BC Managed Care – PPO

## 2018-02-08 ENCOUNTER — Telehealth: Payer: Self-pay | Admitting: Hematology and Oncology

## 2018-02-08 ENCOUNTER — Telehealth: Payer: Self-pay | Admitting: *Deleted

## 2018-02-08 ENCOUNTER — Other Ambulatory Visit: Payer: Self-pay | Admitting: Hematology and Oncology

## 2018-02-08 ENCOUNTER — Inpatient Hospital Stay (HOSPITAL_BASED_OUTPATIENT_CLINIC_OR_DEPARTMENT_OTHER): Payer: BC Managed Care – PPO | Admitting: Hematology and Oncology

## 2018-02-08 ENCOUNTER — Encounter: Payer: Self-pay | Admitting: Hematology and Oncology

## 2018-02-08 VITALS — BP 129/49 | HR 93 | Temp 98.0°F | Resp 18 | Ht 63.0 in | Wt 220.6 lb

## 2018-02-08 DIAGNOSIS — E1165 Type 2 diabetes mellitus with hyperglycemia: Secondary | ICD-10-CM | POA: Diagnosis not present

## 2018-02-08 DIAGNOSIS — T451X5S Adverse effect of antineoplastic and immunosuppressive drugs, sequela: Secondary | ICD-10-CM | POA: Diagnosis not present

## 2018-02-08 DIAGNOSIS — Z9641 Presence of insulin pump (external) (internal): Secondary | ICD-10-CM | POA: Diagnosis not present

## 2018-02-08 DIAGNOSIS — C481 Malignant neoplasm of specified parts of peritoneum: Secondary | ICD-10-CM

## 2018-02-08 DIAGNOSIS — R5383 Other fatigue: Secondary | ICD-10-CM

## 2018-02-08 DIAGNOSIS — D6481 Anemia due to antineoplastic chemotherapy: Secondary | ICD-10-CM

## 2018-02-08 DIAGNOSIS — E118 Type 2 diabetes mellitus with unspecified complications: Secondary | ICD-10-CM

## 2018-02-08 DIAGNOSIS — Z79899 Other long term (current) drug therapy: Secondary | ICD-10-CM

## 2018-02-08 DIAGNOSIS — Z90722 Acquired absence of ovaries, bilateral: Secondary | ICD-10-CM

## 2018-02-08 DIAGNOSIS — Z90710 Acquired absence of both cervix and uterus: Secondary | ICD-10-CM | POA: Diagnosis not present

## 2018-02-08 DIAGNOSIS — I700 Atherosclerosis of aorta: Secondary | ICD-10-CM | POA: Diagnosis not present

## 2018-02-08 DIAGNOSIS — G893 Neoplasm related pain (acute) (chronic): Secondary | ICD-10-CM

## 2018-02-08 DIAGNOSIS — C569 Malignant neoplasm of unspecified ovary: Secondary | ICD-10-CM

## 2018-02-08 DIAGNOSIS — T451X5A Adverse effect of antineoplastic and immunosuppressive drugs, initial encounter: Secondary | ICD-10-CM

## 2018-02-08 DIAGNOSIS — E538 Deficiency of other specified B group vitamins: Secondary | ICD-10-CM

## 2018-02-08 LAB — COMPREHENSIVE METABOLIC PANEL
ALT: 19 U/L (ref 0–55)
AST: 14 U/L (ref 5–34)
Albumin: 3.6 g/dL (ref 3.5–5.0)
Alkaline Phosphatase: 122 U/L (ref 40–150)
Anion gap: 12 — ABNORMAL HIGH (ref 3–11)
BILIRUBIN TOTAL: 0.3 mg/dL (ref 0.2–1.2)
BUN: 35 mg/dL — AB (ref 7–26)
CHLORIDE: 102 mmol/L (ref 98–109)
CO2: 25 mmol/L (ref 22–29)
CREATININE: 1.07 mg/dL (ref 0.60–1.10)
Calcium: 9.4 mg/dL (ref 8.4–10.4)
GFR calc Af Amer: >60 mL/min (ref >60)
GFR, EST NON AFRICAN AMERICAN: 54 mL/min — AB (ref >60)
GLUCOSE: 155 mg/dL — AB (ref 70–140)
POTASSIUM: 4.6 mmol/L (ref 3.5–5.1)
Sodium: 139 mmol/L (ref 136–145)
Total Protein: 6.6 g/dL (ref 6.4–8.3)

## 2018-02-08 LAB — CBC WITH DIFFERENTIAL/PLATELET
Basophils Absolute: 0 10*3/uL (ref 0.0–0.1)
Basophils Relative: 1 %
EOS ABS: 0.1 10*3/uL (ref 0.0–0.5)
EOS PCT: 3 %
HCT: 24.8 % — ABNORMAL LOW (ref 34.8–46.6)
Hemoglobin: 8.3 g/dL — ABNORMAL LOW (ref 11.6–15.9)
LYMPHS ABS: 0.3 10*3/uL — AB (ref 0.9–3.3)
LYMPHS PCT: 11 %
MCH: 34.1 pg — AB (ref 25.1–34.0)
MCHC: 33.6 g/dL (ref 31.5–36.0)
MCV: 101.2 fL — AB (ref 79.5–101.0)
MONO ABS: 0.2 10*3/uL (ref 0.1–0.9)
Monocytes Relative: 7 %
Neutro Abs: 2.3 10*3/uL (ref 1.5–6.5)
Neutrophils Relative %: 78 %
PLATELETS: 125 10*3/uL — AB (ref 145–400)
RBC: 2.45 MIL/uL — AB (ref 3.70–5.45)
RDW: 21 % — ABNORMAL HIGH (ref 11.2–14.5)
WBC: 2.9 10*3/uL — AB (ref 3.9–10.3)

## 2018-02-08 LAB — SAMPLE TO BLOOD BANK

## 2018-02-08 LAB — MAGNESIUM: MAGNESIUM: 1.4 mg/dL — AB (ref 1.5–2.5)

## 2018-02-08 LAB — PREPARE RBC (CROSSMATCH)

## 2018-02-08 MED ORDER — DIPHENHYDRAMINE HCL 25 MG PO CAPS
25.0000 mg | ORAL_CAPSULE | Freq: Once | ORAL | Status: AC
  Administered 2018-02-08: 25 mg via ORAL

## 2018-02-08 MED ORDER — HEPARIN SOD (PORK) LOCK FLUSH 100 UNIT/ML IV SOLN
500.0000 [IU] | Freq: Every day | INTRAVENOUS | Status: AC | PRN
  Administered 2018-02-08: 500 [IU]
  Filled 2018-02-08: qty 5

## 2018-02-08 MED ORDER — DIPHENHYDRAMINE HCL 25 MG PO CAPS
ORAL_CAPSULE | ORAL | Status: AC
  Filled 2018-02-08: qty 1

## 2018-02-08 MED ORDER — SODIUM CHLORIDE 0.9 % IV SOLN
250.0000 mL | Freq: Once | INTRAVENOUS | Status: AC
  Administered 2018-02-08: 250 mL via INTRAVENOUS

## 2018-02-08 MED ORDER — SODIUM CHLORIDE 0.9% FLUSH
10.0000 mL | INTRAVENOUS | Status: AC | PRN
  Administered 2018-02-08: 10 mL
  Filled 2018-02-08: qty 10

## 2018-02-08 MED ORDER — SODIUM CHLORIDE 0.9% FLUSH
10.0000 mL | Freq: Once | INTRAVENOUS | Status: AC
  Administered 2018-02-08: 10 mL
  Filled 2018-02-08: qty 10

## 2018-02-08 MED ORDER — ACETAMINOPHEN 325 MG PO TABS
ORAL_TABLET | ORAL | Status: AC
  Filled 2018-02-08: qty 2

## 2018-02-08 MED ORDER — ACETAMINOPHEN 325 MG PO TABS
650.0000 mg | ORAL_TABLET | Freq: Once | ORAL | Status: AC
  Administered 2018-02-08: 650 mg via ORAL

## 2018-02-08 MED ORDER — MAGNESIUM OXIDE 400 (241.3 MG) MG PO TABS: 400.0000 mg | ORAL_TABLET | Freq: Two times a day (BID) | ORAL | 0 refills | Status: DC

## 2018-02-08 MED ORDER — HYDROCODONE-ACETAMINOPHEN 10-325 MG PO TABS: 1.0000 | ORAL_TABLET | ORAL | 0 refills | Status: DC | PRN

## 2018-02-08 NOTE — Telephone Encounter (Signed)
-----   Message from Heath Lark, MD sent at 02/08/2018  9:39 AM EDT ----- Regarding: low mag Magnesium still low Recommend continue oral BID dose Please refill to her pharmacy ----- Message ----- From: Buel Ream, Lab In [place] Sent: 02/08/2018   8:57 AM To: Heath Lark, MD

## 2018-02-08 NOTE — Assessment & Plan Note (Signed)
We discussed some of the risks, benefits, and alternatives of blood transfusions. The patient is symptomatic from anemia and the hemoglobin level is critically low.  Some of the side-effects to be expected including risks of transfusion reactions, chills, infection, syndrome of volume overload and risk of hospitalization from various reasons and the patient is willing to proceed and went ahead to sign consent today. We will proceed with 1 unit of blood transfusion due to presence of symptoms and mild hypotension.

## 2018-02-08 NOTE — Telephone Encounter (Signed)
Notified of message below. Verbalized understanding 

## 2018-02-08 NOTE — Assessment & Plan Note (Signed)
She has recent fluctuation of blood sugar, likely precipitated by side effects of chemotherapy She follows closely with endocrinologist for medical management We discussed dietary modification 

## 2018-02-08 NOTE — Assessment & Plan Note (Signed)
I have reviewed recent tumor marker and CT scan with the patient and family Overall, she is responding well to treatment since addition of cisplatin, although that was causing some issues with low blood count requiring blood transfusion. We will continue modified dosing and modify schedule She will continue G-CSF support after day 8 of treatment I plan to see her once a month for further supportive care

## 2018-02-08 NOTE — Telephone Encounter (Signed)
Patient scheduled per 4/8 los. Patient will receive update on mychart.

## 2018-02-08 NOTE — Progress Notes (Signed)
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 We will continue modified dosing and modify schedule She will continue G-CSF support after day 8 of treatment I plan to see her once a month for further supportive care  Anemia due to antineoplastic chemotherapy We discussed some of the risks, benefits, and alternatives of blood transfusions. The patient is symptomatic from anemia and the hemoglobin level is critically low.  Some of the side-effects to be expected including risks of transfusion reactions, chills, infection, syndrome of volume overload and risk of hospitalization from various reasons and the patient is willing to proceed and went ahead to sign consent today. We will proceed with 1 unit of blood transfusion due to presence of symptoms and mild hypotension.  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   INTERVAL HISTORY: Please see below for problem oriented charting. She returns with her husband for further follow-up She complained of mild fatigue Denies worsening peripheral neuropathy Her chronic cancer pain is stable No recent changes in bowel habits  SUMMARY OF ONCOLOGIC HISTORY: Oncology  History   Negative genetic testing ER PR positive, Her 2 neu negative     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[place])   04/29/2016 Imaging    CT abd/pelvis- Extensive omental caking as well as moderate amount of ascites within the abdomen most compatible with peritoneal metastatic disease, of unknown primary. This may potentially be ovarian or a GI in etiology.      04/30/2016 Tumor Marker    CA 125- 7149.0 (H)      05/01/2016 Procedure    US paracentesis- Successful ultrasound-guided paracentesis yielding 1.8 liters of peritoneal fluid.      05/01/2016 Imaging    US pelvis- Both transabdominal and transvaginal sonography are significantly limited by large patient habitus and ascites. Neither uterus or ovaries were visualized on this exam.      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      05/08/2016 Imaging    CT chest- No evidence of metastatic disease in the chest. Peritoneal/omental disease with abdominal ascites in the upper abdomen, incompletely visualized.       05/13/2016 Procedure    Placement of single lumen port a cath via right internal jugular vein. The catheter tip lies at the cavoatrial junction. A power injectable port a cath was placed and is ready for immediate use.      05/15/2016 Procedure    US Paracentesis- 3400 ml yellow colored ascites removed      05/15/2016 - 09/18/2016 Chemotherapy    Carboplatin/Paclitaxel every 21 days x 7 cycles      07/01/2016 Miscellaneous    Genetic Counseling by [name]-  Genetic testing was normal, and did not  reveal a deleterious mutation in these genes.       07/08/2016 Imaging    CT CAP- 1. Small volume ascites, significantly decreased. 2. Stable diffuse omental soft tissue caking and diffuse peritoneal thickening along the bilateral paracolic gutters and bilateral pelvic peritoneal reflections,  consistent with peritoneal carcinomatosis. 3. Stable asymmetrically enlarged right ovary, which may represent the primary site of ovarian malignancy. 4. No evidence of metastatic disease in the chest. No new sites of metastatic disease in the abdomen or pelvis.      07/09/2016 Miscellaneous    Gyn Onc re-evaluation- modest response to therapy, 3 more cycles of chemotherapy recommended.        09/11/2016 Imaging    CT C/A/P No significant change omental soft tissue caking, consistent with metastatic disease. Mild ascites is decreased since previous study.  Increased calcification along peritoneal surface in pelvic cul-de-sac, consistent with treated peritoneal metastatic disease.  Stable 4.5cm homogeneous right pelvic mass, which favors a uterine fibroid although right ovarian neoplasm cannot definitely be excluded.  No new or progressive metastatic disease identified. No evidence of metastatic disease within the thorax.       10/14/2016 Procedure    Robotic-assisted laparoscopic total hysterectomy with bilateral salpingoophorectomy, ex lap omentectomy, radical tumor debulking by Dr. Denman George      10/17/2016 Pathology Results    Diagnosis 1. 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 - CERVIX AND ENDOMETRIUM FREE OF TUMOR. - SEE ONCOLOGY TABLE AND COMMENT. 2. Soft tissue, biopsy, umbilical nodule - HIGH GRADE SEROUS CARCINOMA. 3. Omentum, resection for tumor - HIGH GRADE SEROUS CARCINOMA, 33 CM.      11/06/2016 - 12/25/2016 Chemotherapy    Carboplatin/Paclitaxel x 3 cycles       01/12/2017 Imaging    CT CAP- 1. Interval hysterectomy, bilateral salpingo-oophorectomy and omentectomy without evidence of tumor recurrence. 2. No evidence of metastatic disease. 3. 5 mm nonobstructing lower pole left renal calculus.      01/13/2017 Remission    No evidence of residual disease  on CT imaging.      05/04/2017 Imaging    CT CAP- New small amount of ascites within the abdomen and pelvis since 01/12/2017 which could indicate disease progression but no new identifiable tumor and no significant change in omental and mild peritoneal thickening.  No evidence of metastatic disease within the chest.  Coronary artery disease.  Aortic Atherosclerosis (ICD10-I70.0).      05/04/2017 Tumor Marker    Patient's tumor was tested for the following markers: CA 125 Results of the tumor marker test revealed 5809      05/18/2017 Imaging    ECHO; EF 60% -  65      05/25/2017 - 07/07/2017 Chemotherapy    She received Doxil and Avastin. Treatment is stopped due to disease progression      06/01/2017 Procedure    Successful ultrasound-guided therapeutic paracentesis yielding 2.8 liters of peritoneal fluid      06/22/2017 Tumor Marker    Patient's tumor was tested for the following markers: CA 125 Results of the tumor marker test revealed 13440      07/20/2017 Imaging    Mild increase in peritoneal carcinoma within abdomen pelvis since previous study. No significant change and minimal ascites.  No evidence of metastatic disease within the thorax. New mild airspace opacity in left lower lobe, consistent with inflammatory or infectious etiology.      07/30/2017 Tumor Marker  Patient's tumor was tested for the following markers: CA 125 Results of the tumor marker test revealed 12099      07/30/2017 -  Chemotherapy    The patient had gemzar. Cisplatin is added on 11/02/18      08/03/2017 - 08/05/2017 Hospital Admission    She was admitted to the hospital for management of UTI and neutropenic fever      08/13/2017 Adverse Reaction    Dose of chemotherapy is reduced due to neutropenic sepsis      08/24/2017 Tumor Marker    Patient's tumor was tested for the following markers: CA 125 Results of the tumor marker test revealed 7431      09/21/2017 Tumor Marker     Patient's tumor was tested for the following markers: CA 125 Results of the tumor marker test revealed 4176      10/09/2017 Imaging    No significant change in peritoneal carcinomatosis since previous study.  No new or progressive metastatic disease within the abdomen or pelvis.  Stable tiny nonobstructive left renal calculus. No evidence of ureteral calculi or hydronephrosis.      11/10/2017 Tumor Marker    Patient's tumor was tested for the following markers: CA 125 Results of the tumor marker test revealed 4257      11/24/2017 Tumor Marker    Patient's tumor was tested for the following markers: CA 125 Results of the tumor marker test revealed 3377      01/12/2018 Tumor Marker    Patient's tumor was tested for the following markers: CA 125 Results of the tumor marker test revealed 1595      01/21/2018 Imaging    1. Stable to slightly improved omental, gastrohepatic ligament and pelvic disease as detailed above. No new/progressive findings. 2. No acute abdominal or pelvic findings. 3. Stable lower pole left renal calculus.       REVIEW OF SYSTEMS:   Constitutional: Denies fevers, chills or abnormal weight loss Eyes: Denies blurriness of vision Ears, nose, mouth, throat, and face: Denies mucositis or sore throat Respiratory: Denies cough, dyspnea or wheezes Cardiovascular: Denies palpitation, chest discomfort or lower extremity swelling Gastrointestinal:  Denies nausea, heartburn or change in bowel habits Skin: Denies abnormal skin rashes Lymphatics: Denies new lymphadenopathy or easy bruising Neurological:Denies numbness, tingling or new weaknesses Behavioral/Psych: Mood is stable, no new changes  All other systems were reviewed with the patient and are negative.  I have reviewed the past medical history, past surgical history, social history and family history with the patient and they are unchanged from previous note.  ALLERGIES:  is allergic to avandia  [rosiglitazone]; micronase [glyburide]; and actos [pioglitazone].  MEDICATIONS:  Current Outpatient Medications  Medication Sig Dispense Refill  . atorvastatin (LIPITOR) 40 MG tablet TAKE 1 TABLET(40 MG) BY MOUTH DAILY 30 tablet 0  . benazepril (LOTENSIN) 10 MG tablet Take 10 mg by mouth daily.    . furosemide (LASIX) 20 MG tablet TAKE 1 TABLET(20 MG) BY MOUTH THREE TIMES DAILY 90 tablet 0  . gabapentin (NEURONTIN) 300 MG capsule Take 1 capsule (300 mg total) by mouth 3 (three) times daily. (Patient not taking: Reported on 12/30/2017) 90 capsule 11  . glucose blood (ONE TOUCH ULTRA TEST) test strip USE TO CHECK BLOOD SUGAR UP TO 5 TIMES A DAY 450 each 0  . HYDROcodone-acetaminophen (NORCO) 10-325 MG tablet Take 1 tablet by mouth every 4 (four) hours as needed. 90 tablet 0  . insulin aspart (NOVOLOG) 100 UNIT/ML injection USE IN INSULIN  PUMP AS DIRECTED. MAX DAILY DOSE OF 110 UNITS PER DAY 110 mL 2  . lidocaine-prilocaine (EMLA) cream Apply a quarter size amount to port site 1 hour prior to chemo. Do not rub in. Cover with plastic wrap. 30 g 3  . loperamide (IMODIUM A-D) 2 MG tablet Take 1 tablet (2 mg total) by mouth 4 (four) times daily as needed for diarrhea or loose stools. 30 tablet 0  . loratadine (CLARITIN) 10 MG tablet Take 10 mg by mouth daily.    . magnesium oxide (MAG-OX) 400 (241.3 Mg) MG tablet Take 1 tablet (400 mg total) by mouth 2 (two) times daily. 60 tablet 0  . metFORMIN (GLUCOPHAGE) 1000 MG tablet TAKE 1 TABLET BY MOUTH TWICE DAILY WITH A MEAL 180 tablet 3  . Multiple Vitamin (MULTIVITAMIN WITH MINERALS) TABS Take 1 tablet by mouth daily.    . multivitamin-lutein (OCUVITE-LUTEIN) CAPS capsule Take 1 capsule by mouth daily.    Marland Kitchen omeprazole (PRILOSEC) 40 MG capsule Take 1 capsule (40 mg total) daily by mouth. 90 capsule 3  . ondansetron (ZOFRAN) 8 MG tablet Take 1 tablet (8 mg total) by mouth every 8 (eight) hours as needed for nausea or vomiting. 90 tablet 3  . polyethylene  glycol (MIRALAX / GLYCOLAX) packet Take 17 g by mouth daily as needed for mild constipation.     . prochlorperazine (COMPAZINE) 10 MG tablet Take 1 tablet (10 mg total) by mouth every 6 (six) hours as needed for nausea or vomiting. 30 tablet 0  . senna (SENOKOT) 8.6 MG TABS tablet Take 1 tablet (8.6 mg total) by mouth at bedtime as needed for moderate constipation. 120 each 0  . vitamin B-12 (CYANOCOBALAMIN) 1000 MCG tablet Take 1,000 mcg by mouth daily.    . vitamin E (VITAMIN E) 400 UNIT capsule Take 400 Units by mouth daily.     No current facility-administered medications for this visit.    Facility-Administered Medications Ordered in Other Visits  Medication Dose Route Frequency Provider Last Rate Last Dose  . heparin lock flush 100 unit/mL  500 Units Intracatheter Daily PRN Alvy Bimler, Tomie Elko, MD      . sodium chloride flush (NS) 0.9 % injection 10 mL  10 mL Intracatheter PRN Alvy Bimler, Dionisio Aragones, MD        PHYSICAL EXAMINATION: ECOG PERFORMANCE STATUS: 1 - Symptomatic but completely ambulatory  Vitals:   02/08/18 0905  BP: (!) 129/49  Pulse: 93  Resp: 18  Temp: 98 F (36.7 C)  SpO2: 97%   Filed Weights   02/08/18 0905  Weight: 220 lb 9.6 oz (100.1 kg)    GENERAL:alert, no distress and comfortable SKIN: skin color, texture, turgor are normal, no rashes or significant lesions EYES: normal, Conjunctiva are pink and non-injected, sclera clear OROPHARYNX:no exudate, no erythema and lips, buccal mucosa, and tongue normal  NECK: supple, thyroid normal size, non-tender, without nodularity LYMPH:  no palpable lymphadenopathy in the cervical, axillary or inguinal LUNGS: clear to auscultation and percussion with normal breathing effort HEART: regular rate & rhythm and no murmurs with mild lower extremity edema ABDOMEN:abdomen soft, non-tender and normal bowel sounds Musculoskeletal:no cyanosis of digits and no clubbing  NEURO: alert & oriented x 3 with fluent speech, no focal motor/sensory  deficits  LABORATORY DATA:  I have reviewed the data as listed    Component Value Date/Time   NA 139 02/08/2018 0843   NA 139 11/02/2017 0756   K 4.6 02/08/2018 0843   K 4.7 11/02/2017 0756  CL 102 02/08/2018 0843   CO2 25 02/08/2018 0843   CO2 26 11/02/2017 0756   GLUCOSE 155 (H) 02/08/2018 0843   GLUCOSE 140 11/02/2017 0756   BUN 35 (H) 02/08/2018 0843   BUN 23.7 11/02/2017 0756   CREATININE 1.07 02/08/2018 0843   CREATININE 0.9 11/02/2017 0756   CALCIUM 9.4 02/08/2018 0843   CALCIUM 9.4 11/02/2017 0756   PROT 6.6 02/08/2018 0843   PROT 7.0 11/02/2017 0756   ALBUMIN 3.6 02/08/2018 0843   ALBUMIN 3.5 11/02/2017 0756   AST 14 02/08/2018 0843   AST 18 11/02/2017 0756   ALT 19 02/08/2018 0843   ALT 23 11/02/2017 0756   ALKPHOS 122 02/08/2018 0843   ALKPHOS 108 11/02/2017 0756   BILITOT 0.3 02/08/2018 0843   BILITOT 0.24 11/02/2017 0756   GFRNONAA 54 (L) 02/08/2018 0843   GFRAA >60 02/08/2018 0843    No results found for: SPEP, UPEP  Lab Results  Component Value Date   WBC 2.9 (L) 02/08/2018   NEUTROABS 2.3 02/08/2018   HGB 8.3 (L) 02/08/2018   HCT 24.8 (L) 02/08/2018   MCV 101.2 (H) 02/08/2018   PLT 125 (L) 02/08/2018      Chemistry      Component Value Date/Time   NA 139 02/08/2018 0843   NA 139 11/02/2017 0756   K 4.6 02/08/2018 0843   K 4.7 11/02/2017 0756   CL 102 02/08/2018 0843   CO2 25 02/08/2018 0843   CO2 26 11/02/2017 0756   BUN 35 (H) 02/08/2018 0843   BUN 23.7 11/02/2017 0756   CREATININE 1.07 02/08/2018 0843   CREATININE 0.9 11/02/2017 0756      Component Value Date/Time   CALCIUM 9.4 02/08/2018 0843   CALCIUM 9.4 11/02/2017 0756   ALKPHOS 122 02/08/2018 0843   ALKPHOS 108 11/02/2017 0756   AST 14 02/08/2018 0843   AST 18 11/02/2017 0756   ALT 19 02/08/2018 0843   ALT 23 11/02/2017 0756   BILITOT 0.3 02/08/2018 0843   BILITOT 0.24 11/02/2017 0756       RADIOGRAPHIC STUDIES: I have personally reviewed the radiological images  as listed and agreed with the findings in the report. Ct Abdomen Pelvis W Contrast  Result Date: 01/21/2018 CLINICAL DATA:  Restaging ovarian cancer. EXAM: CT ABDOMEN AND PELVIS WITH CONTRAST TECHNIQUE: Multidetector CT imaging of the abdomen and pelvis was performed using the standard protocol following bolus administration of intravenous contrast. CONTRAST:  174mL ISOVUE-300 IOPAMIDOL (ISOVUE-300) INJECTION 61% COMPARISON:  10/09/2017 FINDINGS: Lower chest: The lung bases are clear. No pulmonary lesions or pleural effusion. The heart is normal in size. No pericardial effusion. Hepatobiliary: No focal hepatic lesions or evidence of peritoneal surface disease involving the liver. No perihepatic fluid collections. The gallbladder is normal. No common bile duct dilatation. Pancreas: No mass, inflammation or ductal dilatation. Moderate pancreatic atrophy. Spleen: Borderline splenic enlargement and small calcified granulomas. Adrenals/Urinary Tract: The adrenal glands and kidneys are unremarkable and stable. Lower pole left renal calculus is again demonstrated. Stomach/Bowel: The stomach, duodenum, small bowel and colon are grossly normal. No acute inflammatory changes, mass lesions or obstructive findings. Vascular/Lymphatic: Stable aortic and iliac artery calcifications. No aneurysm. No mesenteric or retroperitoneal lymphadenopathy. Persistent omental disease which appears fairly stable. 4.0 x 1.7 cm lesion on image number 29 is stable. Anterior lesion in the midline appears stable. No progressive findings are identified. Stable soft tissue mass in the gastrohepatic ligament area measures 3.2 x 2.2 cm on image number 25  and measures 3.7 x 2.5 cm on the prior study. In the pelvis there is a stable irregular soft tissue mass adjacent to the sigmoid colon with some surrounding fluid. This measures approximately 4.7 x 2.9 cm and previously measured 5.3 x 3.6 cm. Reproductive: Surgically absent. Other: No abdominal wall  hernia or subcutaneous lesions. Musculoskeletal: No significant bony findings. IMPRESSION: 1. Stable to slightly improved omental, gastrohepatic ligament and pelvic disease as detailed above. No new/progressive findings. 2. No acute abdominal or pelvic findings. 3. Stable lower pole left renal calculus. Electronically Signed   By: Marijo Sanes M.D.   On: 01/21/2018 14:21    All questions were answered. The patient knows to call the clinic with any problems, questions or concerns. No barriers to learning was detected.  I spent 15 minutes counseling the patient face to face. The total time spent in the appointment was 20 minutes and more than 50% was on counseling and review of test results  Heath Lark, MD 02/08/2018 11:03 AM

## 2018-02-08 NOTE — Patient Instructions (Signed)
Implanted Port Home Guide An implanted port is a type of central line that is placed under the skin. Central lines are used to provide IV access when treatment or nutrition needs to be given through a person's veins. Implanted ports are used for long-term IV access. An implanted port may be placed because:  You need IV medicine that would be irritating to the small veins in your hands or arms.  You need long-term IV medicines, such as antibiotics.  You need IV nutrition for a long period.  You need frequent blood draws for lab tests.  You need dialysis.  Implanted ports are usually placed in the chest area, but they can also be placed in the upper arm, the abdomen, or the leg. An implanted port has two main parts:  Reservoir. The reservoir is round and will appear as a small, raised area under your skin. The reservoir is the part where a needle is inserted to give medicines or draw blood.  Catheter. The catheter is a thin, flexible tube that extends from the reservoir. The catheter is placed into a large vein. Medicine that is inserted into the reservoir goes into the catheter and then into the vein.  How will I care for my incision site? Do not get the incision site wet. Bathe or shower as directed by your health care provider. How is my port accessed? Special steps must be taken to access the port:  Before the port is accessed, a numbing cream can be placed on the skin. This helps numb the skin over the port site.  Your health care provider uses a sterile technique to access the port. ? Your health care provider must put on a mask and sterile gloves. ? The skin over your port is cleaned carefully with an antiseptic and allowed to dry. ? The port is gently pinched between sterile gloves, and a needle is inserted into the port.  Only "non-coring" port needles should be used to access the port. Once the port is accessed, a blood return should be checked. This helps ensure that the port  is in the vein and is not clogged.  If your port needs to remain accessed for a constant infusion, a clear (transparent) bandage will be placed over the needle site. The bandage and needle will need to be changed every week, or as directed by your health care provider.  Keep the bandage covering the needle clean and dry. Do not get it wet. Follow your health care provider's instructions on how to take a shower or bath while the port is accessed.  If your port does not need to stay accessed, no bandage is needed over the port.  What is flushing? Flushing helps keep the port from getting clogged. Follow your health care provider's instructions on how and when to flush the port. Ports are usually flushed with saline solution or a medicine called heparin. The need for flushing will depend on how the port is used.  If the port is used for intermittent medicines or blood draws, the port will need to be flushed: ? After medicines have been given. ? After blood has been drawn. ? As part of routine maintenance.  If a constant infusion is running, the port may not need to be flushed.  How long will my port stay implanted? The port can stay in for as long as your health care provider thinks it is needed. When it is time for the port to come out, surgery will be   done to remove it. The procedure is similar to the one performed when the port was put in. When should I seek immediate medical care? When you have an implanted port, you should seek immediate medical care if:  You notice a bad smell coming from the incision site.  You have swelling, redness, or drainage at the incision site.  You have more swelling or pain at the port site or the surrounding area.  You have a fever that is not controlled with medicine.  This information is not intended to replace advice given to you by your health care provider. Make sure you discuss any questions you have with your health care provider. Document  Released: 10/20/2005 Document Revised: 03/27/2016 Document Reviewed: 06/27/2013 Elsevier Interactive Patient Education  2017 [employer]  

## 2018-02-08 NOTE — Patient Instructions (Signed)

## 2018-02-08 NOTE — Assessment & Plan Note (Signed)
This is due to cisplatin side effects She will continue magnesium replacement therapy 

## 2018-02-09 LAB — BPAM RBC
Blood Product Expiration Date: 201905022359
ISSUE DATE / TIME: 201904081117
Unit Type and Rh: 5100

## 2018-02-09 LAB — TYPE AND SCREEN
ABO/RH(D): O POS
ANTIBODY SCREEN: NEGATIVE
UNIT DIVISION: 0

## 2018-02-09 LAB — CA 125: CANCER ANTIGEN (CA) 125: 1058 U/mL — AB (ref 0.0–38.1)

## 2018-02-15 ENCOUNTER — Inpatient Hospital Stay: Payer: BC Managed Care – PPO

## 2018-02-15 ENCOUNTER — Other Ambulatory Visit: Payer: Self-pay | Admitting: Hematology and Oncology

## 2018-02-15 ENCOUNTER — Telehealth: Payer: Self-pay

## 2018-02-15 VITALS — BP 136/55 | HR 85 | Temp 97.8°F | Resp 18

## 2018-02-15 DIAGNOSIS — C481 Malignant neoplasm of specified parts of peritoneum: Secondary | ICD-10-CM | POA: Diagnosis not present

## 2018-02-15 DIAGNOSIS — D6481 Anemia due to antineoplastic chemotherapy: Secondary | ICD-10-CM

## 2018-02-15 DIAGNOSIS — T451X5A Adverse effect of antineoplastic and immunosuppressive drugs, initial encounter: Secondary | ICD-10-CM

## 2018-02-15 DIAGNOSIS — E538 Deficiency of other specified B group vitamins: Secondary | ICD-10-CM

## 2018-02-15 LAB — COMPREHENSIVE METABOLIC PANEL
ALBUMIN: 3.7 g/dL (ref 3.5–5.0)
ALT: 17 U/L (ref 0–55)
ANION GAP: 11 (ref 3–11)
AST: 15 U/L (ref 5–34)
Alkaline Phosphatase: 109 U/L (ref 40–150)
BILIRUBIN TOTAL: 0.4 mg/dL (ref 0.2–1.2)
BUN: 42 mg/dL — ABNORMAL HIGH (ref 7–26)
CHLORIDE: 104 mmol/L (ref 98–109)
CO2: 24 mmol/L (ref 22–29)
Calcium: 9.4 mg/dL (ref 8.4–10.4)
Creatinine, Ser: 1.31 mg/dL — ABNORMAL HIGH (ref 0.60–1.10)
GFR calc Af Amer: 49 mL/min — ABNORMAL LOW (ref >60)
GFR calc non Af Amer: 42 mL/min — ABNORMAL LOW (ref >60)
GLUCOSE: 166 mg/dL — AB (ref 70–140)
POTASSIUM: 4.7 mmol/L (ref 3.5–5.1)
SODIUM: 139 mmol/L (ref 136–145)
TOTAL PROTEIN: 6.6 g/dL (ref 6.4–8.3)

## 2018-02-15 LAB — CBC WITH DIFFERENTIAL/PLATELET
BASOS PCT: 0 %
Basophils Absolute: 0 10*3/uL (ref 0.0–0.1)
EOS ABS: 0.2 10*3/uL (ref 0.0–0.5)
EOS PCT: 4 %
HEMATOCRIT: 28.2 % — AB (ref 34.8–46.6)
Hemoglobin: 9.2 g/dL — ABNORMAL LOW (ref 11.6–15.9)
Lymphocytes Relative: 10 %
Lymphs Abs: 0.5 10*3/uL — ABNORMAL LOW (ref 0.9–3.3)
MCH: 33.5 pg (ref 25.1–34.0)
MCHC: 32.6 g/dL (ref 31.5–36.0)
MCV: 102.5 fL — ABNORMAL HIGH (ref 79.5–101.0)
MONO ABS: 0.4 10*3/uL (ref 0.1–0.9)
MONOS PCT: 8 %
Neutro Abs: 3.8 10*3/uL (ref 1.5–6.5)
Neutrophils Relative %: 78 %
PLATELETS: 72 10*3/uL — AB (ref 145–400)
RBC: 2.75 MIL/uL — ABNORMAL LOW (ref 3.70–5.45)
RDW: 20.9 % — AB (ref 11.2–14.5)
WBC: 4.8 10*3/uL (ref 3.9–10.3)

## 2018-02-15 LAB — SAMPLE TO BLOOD BANK

## 2018-02-15 LAB — MAGNESIUM: MAGNESIUM: 1.5 mg/dL — AB (ref 1.7–2.4)

## 2018-02-15 MED ORDER — SODIUM CHLORIDE 0.9% FLUSH
10.0000 mL | INTRAVENOUS | Status: DC | PRN
  Administered 2018-02-15: 10 mL
  Filled 2018-02-15: qty 10

## 2018-02-15 MED ORDER — SODIUM CHLORIDE 0.9% FLUSH
10.0000 mL | Freq: Once | INTRAVENOUS | Status: AC
  Administered 2018-02-15: 10 mL
  Filled 2018-02-15: qty 10

## 2018-02-15 MED ORDER — PALONOSETRON HCL INJECTION 0.25 MG/5ML
0.2500 mg | Freq: Once | INTRAVENOUS | Status: AC
  Administered 2018-02-15: 0.25 mg via INTRAVENOUS

## 2018-02-15 MED ORDER — PALONOSETRON HCL INJECTION 0.25 MG/5ML
INTRAVENOUS | Status: AC
  Filled 2018-02-15: qty 5

## 2018-02-15 MED ORDER — HEPARIN SOD (PORK) LOCK FLUSH 100 UNIT/ML IV SOLN
500.0000 [IU] | Freq: Once | INTRAVENOUS | Status: AC | PRN
  Administered 2018-02-15: 500 [IU]
  Filled 2018-02-15: qty 5

## 2018-02-15 MED ORDER — SODIUM CHLORIDE 0.9 % IV SOLN
Freq: Once | INTRAVENOUS | Status: AC
  Administered 2018-02-15: 09:00:00 via INTRAVENOUS

## 2018-02-15 MED ORDER — SODIUM CHLORIDE 0.9 % IV SOLN
500.0000 mg/m2 | Freq: Once | INTRAVENOUS | Status: AC
  Administered 2018-02-15: 1026 mg via INTRAVENOUS
  Filled 2018-02-15: qty 26.98

## 2018-02-15 NOTE — Patient Instructions (Signed)
[facility] °Discharge Instructions for Patients Receiving Chemotherapy ° °Today you received the following chemotherapy agents Gemzar ° °To help prevent nausea and vomiting after your treatment, we encourage you to take your nausea medication as directed. °  °If you develop nausea and vomiting that is not controlled by your nausea medication, call the clinic.  ° °BELOW ARE SYMPTOMS THAT SHOULD BE REPORTED IMMEDIATELY: °· *FEVER GREATER THAN 100.5 F °· *CHILLS WITH OR WITHOUT FEVER °· NAUSEA AND VOMITING THAT IS NOT CONTROLLED WITH YOUR NAUSEA MEDICATION °· *UNUSUAL SHORTNESS OF BREATH °· *UNUSUAL BRUISING OR BLEEDING °· TENDERNESS IN MOUTH AND THROAT WITH OR WITHOUT PRESENCE OF ULCERS °· *URINARY PROBLEMS °· *BOWEL PROBLEMS °· UNUSUAL RASH °Items with * indicate a potential emergency and should be followed up as soon as possible. ° °Feel free to call the clinic should you have any questions or concerns. The clinic phone number is [phone]. ° °Please show the CHEMO ALERT CARD at check-in to the Emergency Department and triage nurse. ° ° °

## 2018-02-15 NOTE — Telephone Encounter (Signed)
Called nurse in the infusion room. Patient to continue oral Magnesium as ordered. Merleen Nicely, RN will give patient message.

## 2018-02-21 ENCOUNTER — Other Ambulatory Visit: Payer: Self-pay | Admitting: Hematology and Oncology

## 2018-02-21 DIAGNOSIS — C569 Malignant neoplasm of unspecified ovary: Secondary | ICD-10-CM

## 2018-02-22 ENCOUNTER — Other Ambulatory Visit: Payer: Self-pay

## 2018-02-22 ENCOUNTER — Inpatient Hospital Stay: Payer: BC Managed Care – PPO

## 2018-02-22 ENCOUNTER — Telehealth: Payer: Self-pay | Admitting: Medical

## 2018-02-22 ENCOUNTER — Telehealth: Payer: Self-pay

## 2018-02-22 ENCOUNTER — Other Ambulatory Visit: Payer: Self-pay | Admitting: Hematology and Oncology

## 2018-02-22 ENCOUNTER — Telehealth: Payer: Self-pay | Admitting: Hematology and Oncology

## 2018-02-22 DIAGNOSIS — T451X5A Adverse effect of antineoplastic and immunosuppressive drugs, initial encounter: Secondary | ICD-10-CM

## 2018-02-22 DIAGNOSIS — C481 Malignant neoplasm of specified parts of peritoneum: Secondary | ICD-10-CM

## 2018-02-22 DIAGNOSIS — D6481 Anemia due to antineoplastic chemotherapy: Secondary | ICD-10-CM

## 2018-02-22 DIAGNOSIS — C569 Malignant neoplasm of unspecified ovary: Secondary | ICD-10-CM

## 2018-02-22 DIAGNOSIS — D61818 Other pancytopenia: Secondary | ICD-10-CM

## 2018-02-22 LAB — CBC WITH DIFFERENTIAL/PLATELET
BASOS ABS: 0 10*3/uL (ref 0.0–0.1)
Basophils Relative: 1 %
EOS PCT: 5 %
Eosinophils Absolute: 0.1 10*3/uL (ref 0.0–0.5)
HCT: 25.5 % — ABNORMAL LOW (ref 34.8–46.6)
HEMOGLOBIN: 8.5 g/dL — AB (ref 11.6–15.9)
LYMPHS PCT: 26 %
Lymphs Abs: 0.3 10*3/uL — ABNORMAL LOW (ref 0.9–3.3)
MCH: 34.1 pg — ABNORMAL HIGH (ref 25.1–34.0)
MCHC: 33.5 g/dL (ref 31.5–36.0)
MCV: 102 fL — ABNORMAL HIGH (ref 79.5–101.0)
MONO ABS: 0.3 10*3/uL (ref 0.1–0.9)
Monocytes Relative: 26 %
NEUTROS ABS: 0.5 10*3/uL — AB (ref 1.5–6.5)
Neutrophils Relative %: 42 %
Platelets: 88 10*3/uL — ABNORMAL LOW (ref 145–400)
RBC: 2.5 MIL/uL — AB (ref 3.70–5.45)
RDW: 21.8 % — ABNORMAL HIGH (ref 11.2–14.5)
WBC: 1.3 10*3/uL — ABNORMAL LOW (ref 3.9–10.3)

## 2018-02-22 LAB — COMPREHENSIVE METABOLIC PANEL
ALBUMIN: 3.6 g/dL (ref 3.5–5.0)
ALK PHOS: 103 U/L (ref 40–150)
ALT: 22 U/L (ref 0–55)
AST: 21 U/L (ref 5–34)
Anion gap: 11 (ref 3–11)
BUN: 27 mg/dL — ABNORMAL HIGH (ref 7–26)
CALCIUM: 9.9 mg/dL (ref 8.4–10.4)
CHLORIDE: 103 mmol/L (ref 98–109)
CO2: 27 mmol/L (ref 22–29)
CREATININE: 1.1 mg/dL (ref 0.60–1.10)
GFR calc Af Amer: >60 mL/min (ref >60)
GFR calc non Af Amer: 52 mL/min — ABNORMAL LOW (ref >60)
GLUCOSE: 123 mg/dL (ref 70–140)
Potassium: 4.3 mmol/L (ref 3.5–5.1)
Sodium: 141 mmol/L (ref 136–145)
Total Bilirubin: 0.3 mg/dL (ref 0.2–1.2)
Total Protein: 7 g/dL (ref 6.4–8.3)

## 2018-02-22 LAB — MAGNESIUM: Magnesium: 1.2 mg/dL — CL (ref 1.7–2.4)

## 2018-02-22 LAB — SAMPLE TO BLOOD BANK

## 2018-02-22 NOTE — Patient Instructions (Signed)
Neutropenia Neutropenia is a condition that occurs when you have a lower-than-normal level of a type of white blood cell (neutrophil) in your body. Neutrophils are made in the spongy center of large bones (bone marrow) and they fight infections. Neutrophils are your body's main defense against bacterial and fungal infections. The fewer neutrophils you have and the longer your body remains without them, the greater your risk of getting a severe infection. What are the causes? This condition can occur if your body uses up or destroys neutrophils faster than your bone marrow can make them. This problem may happen because of:  Bacterial or fungal infection.  Allergic disorders.  Reactions to some medicines.  Autoimmune disease.  An enlarged spleen.  This condition can also occur if your bone marrow does not produce enough neutrophils. This problem may be caused by:  Cancer.  Cancer treatments, such as radiation or chemotherapy.  Viral infections.  Medicines, such as phenytoin.  Vitamin B12 deficiency.  Diseases of the bone marrow.  Environmental toxins, such as insecticides.  What are the signs or symptoms? This condition does not usually cause symptoms. If symptoms are present, they are usually caused by an underlying infection. Symptoms of an infection may include:  Fever.  Chills.  Swollen glands.  Oral or anal ulcers.  Cough and shortness of breath.  Rash.  Skin infection.  Fatigue.  How is this diagnosed? Your health care provider may suspect neutropenia if you have:  A condition that may cause neutropenia.  Symptoms of infection, especially fever.  Frequent and unusual infections.  You will have a medical history and physical exam. Tests will also be done, such as:  A complete blood count (CBC).  A procedure to collect a sample of bone marrow for examination (bone marrow biopsy).  A chest X-ray.  A urine culture.  A blood culture.  How is this  treated? Treatment depends on the underlying cause and severity of your condition. Mild neutropenia may not require treatment. Treatment may include medicines, such as:  Antibiotic medicine given through an IV tube.  Antiviral medicines.  Antifungal medicines.  A medicine to increase neutrophil production (colony-stimulating factor). You may get this drug through an IV tube or by injection.  Steroids given through an IV tube.  If an underlying condition is causing neutropenia, you may need treatment for that condition. If medicines you are taking are causing neutropenia, your health care provider may have you stop taking those medicines. Follow these instructions at home: Medicines  Take over-the-counter and prescription medicines only as told by your health care provider.  Get a seasonal flu shot (influenza vaccine). Lifestyle  Do not eat unpasteurized foods.Do not eat unwashed raw fruits or vegetables.  Avoid exposure to groups of people or children.  Avoid being around people who are sick.  Avoid being around dirt or dust, such as in construction areas or gardens.  Do not provide direct care for pets. Avoid animal droppings. Do not clean litter boxes and bird cages. Hygiene   Bathe daily.  Clean the area between the genitals and the anus (perineal area) after you urinate or have a bowel movement. If you are female, wipe from front to back.  Brush your teeth with a soft toothbrush before and after meals.  Do not use a razor that has a blade. Use an electric razor to remove hair.  Wash your hands often. Make sure others who come in contact with you also wash their hands. If soap and water  are not available, use hand sanitizer. General instructions  Do not have sex unless your health care provider has approved.  Take actions to avoid cuts and burns. For example: ? Be cautious when you use knives. Always cut away from yourself. ? Keep knives in protective sheaths or  guards when not in use. ? Use oven mitts when you cook with a hot stove, oven, or grill. ? Stand a safe distance away from open fires.  Avoid people who received a vaccine in the past 30 days if that vaccine contained a live version of the germ (live vaccine). You should not get a live vaccine. Common live vaccines are varicella, measles, mumps, and rubella.  Do not share food utensils.  Do not use tampons, enemas, or rectal suppositories unless your health care provider has approved.  Keep all appointments as told by your health care provider. This is important. Contact a health care provider if:  You have a fever.  You have chills or you start to shake.  You have: ? A sore throat. ? A warm, red, or tender area on your skin. ? A cough. ? Frequent or painful urination. ? Vaginal discharge or itching.  You develop: ? Sores in your mouth or anus. ? Swollen lymph nodes. ? Red streaks on the skin. ? A rash.  You feel: ? Nauseous or you vomit. ? Very fatigued. ? Short of breath. This information is not intended to replace advice given to you by your health care provider. Make sure you discuss any questions you have with your health care provider. Document Released: 04/11/2002 Document Revised: 03/27/2016 Document Reviewed: 05/02/2015 Elsevier Interactive Patient Education  2018 [employer]  

## 2018-02-22 NOTE — Telephone Encounter (Signed)
Received call from lab for critical lab ANC 0.5 and Magnesium 1.2.  Notified Sandi Mealy PA and he will see her in infusion. Notified Wendy/Tim RN in infusion also.

## 2018-02-22 NOTE — Telephone Encounter (Signed)
These labs are called to the patient.  Her magnesium level returned at 1.2.  She has been low previously.  She is taking magnesium oxide 400 mg p.o. twice daily.  She reports that this does not cause her to have diarrhea.  She was told to increase her magnesium oxide to 400 mg p.o. 3 times daily.  This will be rechecked on her return.  Dr. Dominica Severin B. Sherrill expressed agreement with this plan. Sandi Mealy, MHS, PA-C

## 2018-02-22 NOTE — Progress Notes (Signed)
Patient presented to clinic today for labs and possible blood transfusion. Labs reviewed. Per Dr. Benay Spice, we will schedule patient for repeat labs and possible transfusion on Friday, 02/26/2018. Neutropenic precautions reviewed with patient in detail. Patient verbalizes understanding of precautions and plan of care. Office to call patient with new schedule.

## 2018-02-22 NOTE — Telephone Encounter (Signed)
Spoke to patient regarding upcoming April appointments per 4/22 sch message

## 2018-02-26 ENCOUNTER — Inpatient Hospital Stay: Payer: BC Managed Care – PPO

## 2018-02-26 ENCOUNTER — Telehealth: Payer: Self-pay

## 2018-02-26 VITALS — BP 128/61 | HR 91 | Temp 98.4°F | Resp 16

## 2018-02-26 DIAGNOSIS — E538 Deficiency of other specified B group vitamins: Secondary | ICD-10-CM

## 2018-02-26 DIAGNOSIS — C481 Malignant neoplasm of specified parts of peritoneum: Secondary | ICD-10-CM | POA: Diagnosis not present

## 2018-02-26 DIAGNOSIS — T451X5A Adverse effect of antineoplastic and immunosuppressive drugs, initial encounter: Principal | ICD-10-CM

## 2018-02-26 DIAGNOSIS — D6481 Anemia due to antineoplastic chemotherapy: Secondary | ICD-10-CM

## 2018-02-26 LAB — CBC WITH DIFFERENTIAL/PLATELET
BASOS PCT: 1 %
Basophils Absolute: 0 10*3/uL (ref 0.0–0.1)
EOS PCT: 6 %
Eosinophils Absolute: 0.2 10*3/uL (ref 0.0–0.5)
HCT: 29.2 % — ABNORMAL LOW (ref 34.8–46.6)
Hemoglobin: 9.5 g/dL — ABNORMAL LOW (ref 11.6–15.9)
LYMPHS ABS: 0.3 10*3/uL — AB (ref 0.9–3.3)
Lymphocytes Relative: 12 %
MCH: 34 pg (ref 25.1–34.0)
MCHC: 32.7 g/dL (ref 31.5–36.0)
MCV: 104.3 fL — ABNORMAL HIGH (ref 79.5–101.0)
MONO ABS: 0.6 10*3/uL (ref 0.1–0.9)
MONOS PCT: 22 %
Neutro Abs: 1.7 10*3/uL (ref 1.5–6.5)
Neutrophils Relative %: 59 %
Platelets: 86 10*3/uL — ABNORMAL LOW (ref 145–400)
RBC: 2.8 MIL/uL — ABNORMAL LOW (ref 3.70–5.45)
RDW: 23.5 % — AB (ref 11.2–14.5)
WBC: 2.8 10*3/uL — ABNORMAL LOW (ref 3.9–10.3)

## 2018-02-26 LAB — COMPREHENSIVE METABOLIC PANEL
ALK PHOS: 114 U/L (ref 40–150)
ALT: 22 U/L (ref 0–55)
AST: 20 U/L (ref 5–34)
Albumin: 4.1 g/dL (ref 3.5–5.0)
Anion gap: 13 — ABNORMAL HIGH (ref 3–11)
BUN: 30 mg/dL — AB (ref 7–26)
CALCIUM: 10.1 mg/dL (ref 8.4–10.4)
CO2: 26 mmol/L (ref 22–29)
CREATININE: 1.26 mg/dL — AB (ref 0.60–1.10)
Chloride: 103 mmol/L (ref 98–109)
GFR calc non Af Amer: 44 mL/min — ABNORMAL LOW (ref >60)
GFR, EST AFRICAN AMERICAN: 51 mL/min — AB (ref >60)
Glucose, Bld: 94 mg/dL (ref 70–140)
Potassium: 4.4 mmol/L (ref 3.5–5.1)
SODIUM: 142 mmol/L (ref 136–145)
Total Bilirubin: 0.3 mg/dL (ref 0.2–1.2)
Total Protein: 7.4 g/dL (ref 6.4–8.3)

## 2018-02-26 LAB — MAGNESIUM: MAGNESIUM: 1.4 mg/dL — AB (ref 1.7–2.4)

## 2018-02-26 LAB — SAMPLE TO BLOOD BANK

## 2018-02-26 MED ORDER — SODIUM CHLORIDE 0.9 % IJ SOLN
10.0000 mL | Freq: Once | INTRAMUSCULAR | Status: AC
  Administered 2018-02-26: 10 mL via INTRAVENOUS
  Filled 2018-02-26: qty 10

## 2018-02-26 MED ORDER — HEPARIN SOD (PORK) LOCK FLUSH 100 UNIT/ML IV SOLN
500.0000 [IU] | Freq: Once | INTRAVENOUS | Status: AC
  Administered 2018-02-26: 500 [IU]
  Filled 2018-02-26: qty 5

## 2018-02-26 MED ORDER — SODIUM CHLORIDE 0.9% FLUSH
10.0000 mL | Freq: Once | INTRAVENOUS | Status: AC
  Administered 2018-02-26: 10 mL
  Filled 2018-02-26: qty 10

## 2018-02-26 NOTE — Patient Instructions (Signed)

## 2018-02-26 NOTE — Progress Notes (Signed)
Pt scheduled today for 1 unit PRBC. HGB 9.5, patient not symptomatic at this time, VSS.  Lab work reviewed with patient.  No blood transfusion needed today.  Mag reviewed, pt instructed to continue oral mag tid, labs to be rechecked Monday.  Pt in agreement with plan.

## 2018-02-26 NOTE — Telephone Encounter (Signed)
Per Sandi Mealy, PA patient does not need blood today with HGB 9.5. Due to magnesium at 1.4 she is to continue taking mag supplements 3x a day. She has labs on Monday. Infusion RN aware.  Cyndia Bent RN

## 2018-03-01 ENCOUNTER — Inpatient Hospital Stay: Payer: BC Managed Care – PPO

## 2018-03-01 ENCOUNTER — Other Ambulatory Visit: Payer: Self-pay | Admitting: *Deleted

## 2018-03-01 VITALS — BP 111/42 | HR 86 | Temp 97.7°F | Resp 18

## 2018-03-01 DIAGNOSIS — T451X5A Adverse effect of antineoplastic and immunosuppressive drugs, initial encounter: Secondary | ICD-10-CM

## 2018-03-01 DIAGNOSIS — C481 Malignant neoplasm of specified parts of peritoneum: Secondary | ICD-10-CM

## 2018-03-01 DIAGNOSIS — E538 Deficiency of other specified B group vitamins: Secondary | ICD-10-CM

## 2018-03-01 DIAGNOSIS — C569 Malignant neoplasm of unspecified ovary: Secondary | ICD-10-CM

## 2018-03-01 DIAGNOSIS — D6481 Anemia due to antineoplastic chemotherapy: Secondary | ICD-10-CM

## 2018-03-01 LAB — COMPREHENSIVE METABOLIC PANEL
ALBUMIN: 3.8 g/dL (ref 3.5–5.0)
ALK PHOS: 104 U/L (ref 40–150)
ALT: 18 U/L (ref 0–55)
AST: 18 U/L (ref 5–34)
Anion gap: 12 — ABNORMAL HIGH (ref 3–11)
BILIRUBIN TOTAL: 0.2 mg/dL (ref 0.2–1.2)
BUN: 33 mg/dL — AB (ref 7–26)
CALCIUM: 9.8 mg/dL (ref 8.4–10.4)
CO2: 23 mmol/L (ref 22–29)
Chloride: 104 mmol/L (ref 98–109)
Creatinine, Ser: 1.21 mg/dL — ABNORMAL HIGH (ref 0.60–1.10)
GFR calc Af Amer: 54 mL/min — ABNORMAL LOW (ref >60)
GFR calc non Af Amer: 46 mL/min — ABNORMAL LOW (ref >60)
GLUCOSE: 112 mg/dL (ref 70–140)
Potassium: 4.6 mmol/L (ref 3.5–5.1)
Sodium: 139 mmol/L (ref 136–145)
TOTAL PROTEIN: 6.7 g/dL (ref 6.4–8.3)

## 2018-03-01 LAB — CBC WITH DIFFERENTIAL/PLATELET
BASOS ABS: 0 10*3/uL (ref 0.0–0.1)
BASOS PCT: 0 %
Eosinophils Absolute: 0.2 10*3/uL (ref 0.0–0.5)
Eosinophils Relative: 5 %
HCT: 27.9 % — ABNORMAL LOW (ref 34.8–46.6)
HEMOGLOBIN: 8.9 g/dL — AB (ref 11.6–15.9)
Lymphocytes Relative: 15 %
Lymphs Abs: 0.5 10*3/uL — ABNORMAL LOW (ref 0.9–3.3)
MCH: 34.1 pg — ABNORMAL HIGH (ref 25.1–34.0)
MCHC: 31.9 g/dL (ref 31.5–36.0)
MCV: 106.9 fL — ABNORMAL HIGH (ref 79.5–101.0)
MONOS PCT: 14 %
Monocytes Absolute: 0.4 10*3/uL (ref 0.1–0.9)
Neutro Abs: 2.1 10*3/uL (ref 1.5–6.5)
Neutrophils Relative %: 66 %
Platelets: 132 10*3/uL — ABNORMAL LOW (ref 145–400)
RBC: 2.61 MIL/uL — ABNORMAL LOW (ref 3.70–5.45)
RDW: 21.3 % — ABNORMAL HIGH (ref 11.2–14.5)
WBC: 3.2 10*3/uL — ABNORMAL LOW (ref 3.9–10.3)

## 2018-03-01 LAB — MAGNESIUM: Magnesium: 1.4 mg/dL — CL (ref 1.7–2.4)

## 2018-03-01 LAB — SAMPLE TO BLOOD BANK

## 2018-03-01 MED ORDER — SODIUM CHLORIDE 0.9% FLUSH
10.0000 mL | Freq: Once | INTRAVENOUS | Status: AC
  Administered 2018-03-01: 10 mL
  Filled 2018-03-01: qty 10

## 2018-03-01 MED ORDER — SODIUM CHLORIDE 0.9 % IV SOLN
500.0000 mg/m2 | Freq: Once | INTRAVENOUS | Status: AC
  Administered 2018-03-01: 1026 mg via INTRAVENOUS
  Filled 2018-03-01: qty 26.98

## 2018-03-01 MED ORDER — SODIUM CHLORIDE 0.9% FLUSH
10.0000 mL | INTRAVENOUS | Status: DC | PRN
  Administered 2018-03-01: 10 mL
  Filled 2018-03-01: qty 10

## 2018-03-01 MED ORDER — HYDROCODONE-ACETAMINOPHEN 10-325 MG PO TABS: 1.0000 | ORAL_TABLET | ORAL | 0 refills | Status: DC | PRN

## 2018-03-01 MED ORDER — SODIUM CHLORIDE 0.9 % IV SOLN
50.0000 mg | Freq: Once | INTRAVENOUS | Status: AC
  Administered 2018-03-01: 50 mg via INTRAVENOUS
  Filled 2018-03-01: qty 50

## 2018-03-01 MED ORDER — PALONOSETRON HCL INJECTION 0.25 MG/5ML
INTRAVENOUS | Status: AC
  Filled 2018-03-01: qty 5

## 2018-03-01 MED ORDER — SODIUM CHLORIDE 0.9 % IV SOLN
Freq: Once | INTRAVENOUS | Status: AC
  Administered 2018-03-01: 12:00:00 via INTRAVENOUS
  Filled 2018-03-01: qty 5

## 2018-03-01 MED ORDER — PALONOSETRON HCL INJECTION 0.25 MG/5ML
0.2500 mg | Freq: Once | INTRAVENOUS | Status: AC
  Administered 2018-03-01: 0.25 mg via INTRAVENOUS

## 2018-03-01 MED ORDER — SODIUM CHLORIDE 0.9 % IV SOLN
Freq: Once | INTRAVENOUS | Status: AC
  Administered 2018-03-01: 12:00:00 via INTRAVENOUS

## 2018-03-01 MED ORDER — HEPARIN SOD (PORK) LOCK FLUSH 100 UNIT/ML IV SOLN
500.0000 [IU] | Freq: Once | INTRAVENOUS | Status: AC | PRN
  Administered 2018-03-01: 500 [IU]
  Filled 2018-03-01: qty 5

## 2018-03-01 MED ORDER — POTASSIUM CHLORIDE 2 MEQ/ML IV SOLN
Freq: Once | INTRAVENOUS | Status: AC
  Administered 2018-03-01: 10:00:00 via INTRAVENOUS
  Filled 2018-03-01: qty 10

## 2018-03-01 MED FILL — HYDROCODON-APAP 10-325: 10-325 | 15 days supply | Qty: 90 | Fill #0

## 2018-03-01 NOTE — Patient Instructions (Signed)
[facility] Discharge Instructions for Patients Receiving Chemotherapy  Today you received the following chemotherapy agents:  Gemzar, Cisplatin  To help prevent nausea and vomiting after your treatment, we encourage you to take your nausea medication as prescribed.   If you develop nausea and vomiting that is not controlled by your nausea medication, call the clinic.   BELOW ARE SYMPTOMS THAT SHOULD BE REPORTED IMMEDIATELY:  *FEVER GREATER THAN 100.5 F  *CHILLS WITH OR WITHOUT FEVER  NAUSEA AND VOMITING THAT IS NOT CONTROLLED WITH YOUR NAUSEA MEDICATION  *UNUSUAL SHORTNESS OF BREATH  *UNUSUAL BRUISING OR BLEEDING  TENDERNESS IN MOUTH AND THROAT WITH OR WITHOUT PRESENCE OF ULCERS  *URINARY PROBLEMS  *BOWEL PROBLEMS  UNUSUAL RASH Items with * indicate a potential emergency and should be followed up as soon as possible.  Feel free to call the clinic should you have any questions or concerns. The clinic phone number is [phone].  Please show the CHEMO ALERT CARD at check-in to the Emergency Department and triage nurse.   

## 2018-03-02 ENCOUNTER — Other Ambulatory Visit: Payer: Self-pay | Admitting: Pediatrics

## 2018-03-02 DIAGNOSIS — E785 Hyperlipidemia, unspecified: Secondary | ICD-10-CM

## 2018-03-08 ENCOUNTER — Inpatient Hospital Stay: Payer: BC Managed Care – PPO

## 2018-03-08 ENCOUNTER — Inpatient Hospital Stay (HOSPITAL_BASED_OUTPATIENT_CLINIC_OR_DEPARTMENT_OTHER): Payer: BC Managed Care – PPO | Admitting: Hematology and Oncology

## 2018-03-08 ENCOUNTER — Telehealth: Payer: Self-pay | Admitting: Hematology and Oncology

## 2018-03-08 ENCOUNTER — Inpatient Hospital Stay: Payer: BC Managed Care – PPO | Attending: Hematology and Oncology

## 2018-03-08 ENCOUNTER — Encounter: Payer: Self-pay | Admitting: Hematology and Oncology

## 2018-03-08 DIAGNOSIS — C786 Secondary malignant neoplasm of retroperitoneum and peritoneum: Secondary | ICD-10-CM | POA: Diagnosis not present

## 2018-03-08 DIAGNOSIS — G629 Polyneuropathy, unspecified: Secondary | ICD-10-CM

## 2018-03-08 DIAGNOSIS — T451X5A Adverse effect of antineoplastic and immunosuppressive drugs, initial encounter: Secondary | ICD-10-CM

## 2018-03-08 DIAGNOSIS — I2510 Atherosclerotic heart disease of native coronary artery without angina pectoris: Secondary | ICD-10-CM | POA: Insufficient documentation

## 2018-03-08 DIAGNOSIS — T451X5S Adverse effect of antineoplastic and immunosuppressive drugs, sequela: Secondary | ICD-10-CM | POA: Diagnosis not present

## 2018-03-08 DIAGNOSIS — Z79899 Other long term (current) drug therapy: Secondary | ICD-10-CM | POA: Diagnosis not present

## 2018-03-08 DIAGNOSIS — N183 Chronic kidney disease, stage 3 unspecified: Secondary | ICD-10-CM | POA: Insufficient documentation

## 2018-03-08 DIAGNOSIS — Z90710 Acquired absence of both cervix and uterus: Secondary | ICD-10-CM | POA: Diagnosis not present

## 2018-03-08 DIAGNOSIS — G620 Drug-induced polyneuropathy: Secondary | ICD-10-CM | POA: Insufficient documentation

## 2018-03-08 DIAGNOSIS — Z5111 Encounter for antineoplastic chemotherapy: Secondary | ICD-10-CM | POA: Diagnosis not present

## 2018-03-08 DIAGNOSIS — Z794 Long term (current) use of insulin: Secondary | ICD-10-CM | POA: Insufficient documentation

## 2018-03-08 DIAGNOSIS — R5383 Other fatigue: Secondary | ICD-10-CM

## 2018-03-08 DIAGNOSIS — C481 Malignant neoplasm of specified parts of peritoneum: Secondary | ICD-10-CM

## 2018-03-08 DIAGNOSIS — D61818 Other pancytopenia: Secondary | ICD-10-CM | POA: Insufficient documentation

## 2018-03-08 DIAGNOSIS — M791 Myalgia, unspecified site: Secondary | ICD-10-CM | POA: Insufficient documentation

## 2018-03-08 DIAGNOSIS — D6481 Anemia due to antineoplastic chemotherapy: Secondary | ICD-10-CM

## 2018-03-08 DIAGNOSIS — C578 Malignant neoplasm of overlapping sites of female genital organs: Secondary | ICD-10-CM

## 2018-03-08 DIAGNOSIS — Z90722 Acquired absence of ovaries, bilateral: Secondary | ICD-10-CM

## 2018-03-08 DIAGNOSIS — M25551 Pain in right hip: Secondary | ICD-10-CM | POA: Insufficient documentation

## 2018-03-08 DIAGNOSIS — G8929 Other chronic pain: Secondary | ICD-10-CM | POA: Insufficient documentation

## 2018-03-08 LAB — COMPREHENSIVE METABOLIC PANEL
ALK PHOS: 109 U/L (ref 40–150)
ALT: 17 U/L (ref 0–55)
ANION GAP: 8 (ref 3–11)
AST: 16 U/L (ref 5–34)
Albumin: 3.8 g/dL (ref 3.5–5.0)
BUN: 39 mg/dL — ABNORMAL HIGH (ref 7–26)
CALCIUM: 10.1 mg/dL (ref 8.4–10.4)
CO2: 29 mmol/L (ref 22–29)
Chloride: 104 mmol/L (ref 98–109)
Creatinine, Ser: 1.27 mg/dL — ABNORMAL HIGH (ref 0.60–1.10)
GFR, EST AFRICAN AMERICAN: 51 mL/min — AB (ref >60)
GFR, EST NON AFRICAN AMERICAN: 44 mL/min — AB (ref >60)
Glucose, Bld: 97 mg/dL (ref 70–140)
Potassium: 4.3 mmol/L (ref 3.5–5.1)
Sodium: 141 mmol/L (ref 136–145)
Total Bilirubin: 0.4 mg/dL (ref 0.2–1.2)
Total Protein: 6.9 g/dL (ref 6.4–8.3)

## 2018-03-08 LAB — CBC WITH DIFFERENTIAL/PLATELET
BASOS PCT: 1 %
Basophils Absolute: 0 10*3/uL (ref 0.0–0.1)
EOS ABS: 0.1 10*3/uL (ref 0.0–0.5)
Eosinophils Relative: 6 %
HEMATOCRIT: 27.6 % — AB (ref 34.8–46.6)
HEMOGLOBIN: 9 g/dL — AB (ref 11.6–15.9)
LYMPHS ABS: 0.3 10*3/uL — AB (ref 0.9–3.3)
Lymphocytes Relative: 31 %
MCH: 34.5 pg — ABNORMAL HIGH (ref 25.1–34.0)
MCHC: 32.6 g/dL (ref 31.5–36.0)
MCV: 105.7 fL — ABNORMAL HIGH (ref 79.5–101.0)
MONOS PCT: 4 %
Monocytes Absolute: 0 10*3/uL — ABNORMAL LOW (ref 0.1–0.9)
NEUTROS ABS: 0.7 10*3/uL — AB (ref 1.5–6.5)
Neutrophils Relative %: 58 %
PLATELETS: 125 10*3/uL — AB (ref 145–400)
RBC: 2.61 MIL/uL — AB (ref 3.70–5.45)
RDW: 19.4 % — ABNORMAL HIGH (ref 11.2–14.5)
WBC: 1.1 10*3/uL — AB (ref 3.9–10.3)

## 2018-03-08 LAB — MAGNESIUM: Magnesium: 2 mg/dL (ref 1.7–2.4)

## 2018-03-08 LAB — SAMPLE TO BLOOD BANK

## 2018-03-08 NOTE — Assessment & Plan Note (Signed)
She has stable chronic kidney disease stage III We will monitor her blood counts carefully and will continue on dose reduced treatment

## 2018-03-08 NOTE — Progress Notes (Signed)
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 We will continue modified dosing and modify schedule to get chemo every 2 weeks or longer to allow blood count recovery She will continue G-CSF support after day 8 of treatment I plan to see her once a month for further supportive care She does not need transfusion today I have requested her to come in Friday for blood count draw   Pancytopenia, acquired ([place]) She is profoundly pancytopenic from treatment She does not need blood transfusion today She is educated to watch out for signs and symptoms of bleeding and we discussed neutropenic precautions She will continue G-CSF support with each cycle of treatment  CKD (chronic kidney disease), stage III ([place]) She has stable chronic kidney disease stage III We will monitor her blood counts carefully and will continue on dose reduced treatment  Hypomagnesemia This is due to cisplatin side effects She will continue magnesium replacement therapy   No orders of the defined types were placed in this encounter.   INTERVAL HISTORY: Please see below for problem oriented charting. She returns for chemotherapy follow-up She feels well except for mild fatigue She is compliant taking magnesium supplement The patient denies any recent signs or symptoms of bleeding such as spontaneous epistaxis, hematuria or hematochezia. She denies recent infection No worsening peripheral neuropathy.  SUMMARY OF ONCOLOGIC HISTORY: Oncology History   Negative genetic testing ER PR positive, Her 2 neu negative     Extraovarian primary  peritoneal carcinoma ([place])   04/29/2016 Imaging    CT abd/pelvis- Extensive omental caking as well as moderate amount of ascites within the abdomen most compatible with peritoneal metastatic disease, of unknown primary. This may potentially be ovarian or a GI in etiology.      04/30/2016 Tumor Marker    CA 125- 7149.0 (H)      05/01/2016 Procedure    US paracentesis- Successful ultrasound-guided paracentesis yielding 1.8 liters of peritoneal fluid.      05/01/2016 Imaging    US pelvis- Both transabdominal and transvaginal sonography are significantly limited by large patient habitus and ascites. Neither uterus or ovaries were visualized on this exam.      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      05/08/2016 Imaging    CT chest- No evidence of metastatic disease in the chest. Peritoneal/omental disease with abdominal ascites in the upper abdomen, incompletely visualized.       05/13/2016 Procedure    Placement of single lumen port a cath via right internal jugular vein. The catheter tip lies at the cavoatrial junction. A power injectable port a cath was placed and is ready for immediate use.      05/15/2016 Procedure    US Paracentesis- 3400 ml yellow colored ascites removed      05/15/2016 - 09/18/2016 Chemotherapy    Carboplatin/Paclitaxel every 21 days x 7 cycles      07/01/2016 Miscellaneous    Genetic Counseling by [name]-  Genetic testing was normal, and did not reveal a deleterious mutation in these genes.  07/08/2016 Imaging    CT CAP- 1. Small volume ascites, significantly decreased. 2. Stable diffuse omental soft tissue caking and diffuse peritoneal thickening along the bilateral paracolic gutters and bilateral pelvic peritoneal reflections, consistent with peritoneal carcinomatosis. 3. Stable asymmetrically enlarged right ovary, which  may represent the primary site of ovarian malignancy. 4. No evidence of metastatic disease in the chest. No new sites of metastatic disease in the abdomen or pelvis.      07/09/2016 Miscellaneous    Gyn Onc re-evaluation- modest response to therapy, 3 more cycles of chemotherapy recommended.        09/11/2016 Imaging    CT C/A/P No significant change omental soft tissue caking, consistent with metastatic disease. Mild ascites is decreased since previous study.  Increased calcification along peritoneal surface in pelvic cul-de-sac, consistent with treated peritoneal metastatic disease.  Stable 4.5cm homogeneous right pelvic mass, which favors a uterine fibroid although right ovarian neoplasm cannot definitely be excluded.  No new or progressive metastatic disease identified. No evidence of metastatic disease within the thorax.       10/14/2016 Procedure    Robotic-assisted laparoscopic total hysterectomy with bilateral salpingoophorectomy, ex lap omentectomy, radical tumor debulking by Dr. Denman George      10/17/2016 Pathology Results    Diagnosis 1. 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 - CERVIX AND ENDOMETRIUM FREE OF TUMOR. - SEE ONCOLOGY TABLE AND COMMENT. 2. Soft tissue, biopsy, umbilical nodule - HIGH GRADE SEROUS CARCINOMA. 3. Omentum, resection for tumor - HIGH GRADE SEROUS CARCINOMA, 33 CM.      11/06/2016 - 12/25/2016 Chemotherapy    Carboplatin/Paclitaxel x 3 cycles       01/12/2017 Imaging    CT CAP- 1. Interval hysterectomy, bilateral salpingo-oophorectomy and omentectomy without evidence of tumor recurrence. 2. No evidence of metastatic disease. 3. 5 mm nonobstructing lower pole left renal calculus.      01/13/2017 Remission    No evidence of residual disease on CT imaging.      05/04/2017 Imaging    CT CAP- New small amount of ascites within the  abdomen and pelvis since 01/12/2017 which could indicate disease progression but no new identifiable tumor and no significant change in omental and mild peritoneal thickening.  No evidence of metastatic disease within the chest.  Coronary artery disease.  Aortic Atherosclerosis (ICD10-I70.0).      05/04/2017 Tumor Marker    Patient's tumor was tested for the following markers: CA 125 Results of the tumor marker test revealed 9811      05/18/2017 Imaging    ECHO; EF 60% -  65      05/25/2017 - 07/07/2017 Chemotherapy    She received Doxil and Avastin. Treatment is stopped due to disease progression      06/01/2017 Procedure    Successful ultrasound-guided therapeutic paracentesis yielding 2.8 liters of peritoneal fluid      06/22/2017 Tumor Marker    Patient's tumor was tested for the following markers: CA 125 Results of the tumor marker test revealed 13440      07/20/2017 Imaging    Mild increase in peritoneal carcinoma within abdomen pelvis since previous study. No significant change and minimal ascites.  No evidence of metastatic disease within the thorax. New mild airspace opacity in left lower lobe, consistent with inflammatory or infectious etiology.      07/30/2017 Tumor Marker    Patient's tumor was tested for the following markers: CA 125  Results of the tumor marker test revealed 12099      07/30/2017 -  Chemotherapy    The patient had gemzar. Cisplatin is added on 11/02/18      08/03/2017 - 08/05/2017 Hospital Admission    She was admitted to the hospital for management of UTI and neutropenic fever      08/13/2017 Adverse Reaction    Dose of chemotherapy is reduced due to neutropenic sepsis      08/24/2017 Tumor Marker    Patient's tumor was tested for the following markers: CA 125 Results of the tumor marker test revealed 7431      09/21/2017 Tumor Marker    Patient's tumor was tested for the following markers: CA 125 Results of the tumor marker test  revealed 4176      10/09/2017 Imaging    No significant change in peritoneal carcinomatosis since previous study.  No new or progressive metastatic disease within the abdomen or pelvis.  Stable tiny nonobstructive left renal calculus. No evidence of ureteral calculi or hydronephrosis.      11/10/2017 Tumor Marker    Patient's tumor was tested for the following markers: CA 125 Results of the tumor marker test revealed 4257      11/24/2017 Tumor Marker    Patient's tumor was tested for the following markers: CA 125 Results of the tumor marker test revealed 3377      01/12/2018 Tumor Marker    Patient's tumor was tested for the following markers: CA 125 Results of the tumor marker test revealed 1595      01/21/2018 Imaging    1. Stable to slightly improved omental, gastrohepatic ligament and pelvic disease as detailed above. No new/progressive findings. 2. No acute abdominal or pelvic findings. 3. Stable lower pole left renal calculus.      02/08/2018 Tumor Marker    Patient's tumor was tested for the following markers: CA 125 Results of the tumor marker test revealed 1058       REVIEW OF SYSTEMS:   Constitutional: Denies fevers, chills or abnormal weight loss Eyes: Denies blurriness of vision Ears, nose, mouth, throat, and face: Denies mucositis or sore throat Respiratory: Denies cough, dyspnea or wheezes Cardiovascular: Denies palpitation, chest discomfort or lower extremity swelling Gastrointestinal:  Denies nausea, heartburn or change in bowel habits Skin: Denies abnormal skin rashes Lymphatics: Denies new lymphadenopathy or easy bruising Neurological:Denies numbness, tingling or new weaknesses Behavioral/Psych: Mood is stable, no new changes  All other systems were reviewed with the patient and are negative.  I have reviewed the past medical history, past surgical history, social history and family history with the patient and they are unchanged from previous  note.  ALLERGIES:  is allergic to avandia [rosiglitazone]; micronase [glyburide]; and actos [pioglitazone].  MEDICATIONS:  Current Outpatient Medications  Medication Sig Dispense Refill  . Biotin 1000 MCG tablet Take 1,000 mcg by mouth 3 (three) times daily.    Marland Kitchen atorvastatin (LIPITOR) 40 MG tablet TAKE 1 TABLET(40 MG) BY MOUTH DAILY 30 tablet 0  . benazepril (LOTENSIN) 10 MG tablet Take 5 mg by mouth daily.     . furosemide (LASIX) 20 MG tablet TAKE 1 TABLET(20 MG) BY MOUTH THREE TIMES DAILY 90 tablet 0  . gabapentin (NEURONTIN) 300 MG capsule Take 1 capsule (300 mg total) by mouth 3 (three) times daily. (Patient not taking: Reported on 12/30/2017) 90 capsule 11  . glucose blood (ONE TOUCH ULTRA TEST) test strip USE TO CHECK BLOOD SUGAR UP TO 5 TIMES  A DAY 450 each 0  . HYDROcodone-acetaminophen (NORCO) 10-325 MG tablet Take 1 tablet by mouth every 4 (four) hours as needed. 90 tablet 0  . insulin aspart (NOVOLOG) 100 UNIT/ML injection USE IN INSULIN PUMP AS DIRECTED. MAX DAILY DOSE OF 110 UNITS PER DAY 110 mL 2  . lidocaine-prilocaine (EMLA) cream Apply a quarter size amount to port site 1 hour prior to chemo. Do not rub in. Cover with plastic wrap. 30 g 3  . loperamide (IMODIUM A-D) 2 MG tablet Take 1 tablet (2 mg total) by mouth 4 (four) times daily as needed for diarrhea or loose stools. 30 tablet 0  . loratadine (CLARITIN) 10 MG tablet Take 10 mg by mouth daily.    . magnesium oxide (MAG-OX) 400 (241.3 Mg) MG tablet Take 1 tablet (400 mg total) by mouth 2 (two) times daily. (Patient taking differently: Take 400 mg by mouth 3 (three) times daily. ) 60 tablet 0  . metFORMIN (GLUCOPHAGE) 1000 MG tablet TAKE 1 TABLET BY MOUTH TWICE DAILY WITH A MEAL 180 tablet 3  . Multiple Vitamin (MULTIVITAMIN WITH MINERALS) TABS Take 1 tablet by mouth daily.    . multivitamin-lutein (OCUVITE-LUTEIN) CAPS capsule Take 1 capsule by mouth daily.    Marland Kitchen omeprazole (PRILOSEC) 40 MG capsule Take 1 capsule (40 mg  total) daily by mouth. 90 capsule 3  . ondansetron (ZOFRAN) 8 MG tablet Take 1 tablet (8 mg total) by mouth every 8 (eight) hours as needed for nausea or vomiting. 90 tablet 3  . polyethylene glycol (MIRALAX / GLYCOLAX) packet Take 17 g by mouth daily as needed for mild constipation.     . prochlorperazine (COMPAZINE) 10 MG tablet Take 1 tablet (10 mg total) by mouth every 6 (six) hours as needed for nausea or vomiting. 30 tablet 0  . senna (SENOKOT) 8.6 MG TABS tablet Take 1 tablet (8.6 mg total) by mouth at bedtime as needed for moderate constipation. 120 each 0  . vitamin B-12 (CYANOCOBALAMIN) 1000 MCG tablet Take 1,000 mcg by mouth daily.    . vitamin E (VITAMIN E) 400 UNIT capsule Take 400 Units by mouth daily.     No current facility-administered medications for this visit.     PHYSICAL EXAMINATION: ECOG PERFORMANCE STATUS: 1 - Symptomatic but completely ambulatory  Vitals:   03/08/18 0931  BP: (!) 143/52  Pulse: 95  Resp: 18  Temp: 98.3 F (36.8 C)  SpO2: 96%   Filed Weights   03/08/18 0931  Weight: 218 lb 9.6 oz (99.2 kg)    GENERAL:alert, no distress and comfortable SKIN: skin color, texture, turgor are normal, no rashes or significant lesions EYES: normal, Conjunctiva are pink and non-injected, sclera clear OROPHARYNX:no exudate, no erythema and lips, buccal mucosa, and tongue normal  NECK: supple, thyroid normal size, non-tender, without nodularity LYMPH:  no palpable lymphadenopathy in the cervical, axillary or inguinal LUNGS: clear to auscultation and percussion with normal breathing effort HEART: regular rate & rhythm and no murmurs and no lower extremity edema ABDOMEN:abdomen soft, non-tender and normal bowel sounds Musculoskeletal:no cyanosis of digits and no clubbing  NEURO: alert & oriented x 3 with fluent speech, no focal motor/sensory deficits  LABORATORY DATA:  I have reviewed the data as listed    Component Value Date/Time   NA 141 03/08/2018 0907    NA 139 11/02/2017 0756   K 4.3 03/08/2018 0907   K 4.7 11/02/2017 0756   CL 104 03/08/2018 0907   CO2 29 03/08/2018 0907  CO2 26 11/02/2017 0756   GLUCOSE 97 03/08/2018 0907   GLUCOSE 140 11/02/2017 0756   BUN 39 (H) 03/08/2018 0907   BUN 23.7 11/02/2017 0756   CREATININE 1.27 (H) 03/08/2018 0907   CREATININE 0.9 11/02/2017 0756   CALCIUM 10.1 03/08/2018 0907   CALCIUM 9.4 11/02/2017 0756   PROT 6.9 03/08/2018 0907   PROT 7.0 11/02/2017 0756   ALBUMIN 3.8 03/08/2018 0907   ALBUMIN 3.5 11/02/2017 0756   AST 16 03/08/2018 0907   AST 18 11/02/2017 0756   ALT 17 03/08/2018 0907   ALT 23 11/02/2017 0756   ALKPHOS 109 03/08/2018 0907   ALKPHOS 108 11/02/2017 0756   BILITOT 0.4 03/08/2018 0907   BILITOT 0.24 11/02/2017 0756   GFRNONAA 44 (L) 03/08/2018 0907   GFRAA 51 (L) 03/08/2018 0907    No results found for: SPEP, UPEP  Lab Results  Component Value Date   WBC 1.1 (L) 03/08/2018   NEUTROABS 0.7 (L) 03/08/2018   HGB 9.0 (L) 03/08/2018   HCT 27.6 (L) 03/08/2018   MCV 105.7 (H) 03/08/2018   PLT 125 (L) 03/08/2018      Chemistry      Component Value Date/Time   NA 141 03/08/2018 0907   NA 139 11/02/2017 0756   K 4.3 03/08/2018 0907   K 4.7 11/02/2017 0756   CL 104 03/08/2018 0907   CO2 29 03/08/2018 0907   CO2 26 11/02/2017 0756   BUN 39 (H) 03/08/2018 0907   BUN 23.7 11/02/2017 0756   CREATININE 1.27 (H) 03/08/2018 0907   CREATININE 0.9 11/02/2017 0756      Component Value Date/Time   CALCIUM 10.1 03/08/2018 0907   CALCIUM 9.4 11/02/2017 0756   ALKPHOS 109 03/08/2018 0907   ALKPHOS 108 11/02/2017 0756   AST 16 03/08/2018 0907   AST 18 11/02/2017 0756   ALT 17 03/08/2018 0907   ALT 23 11/02/2017 0756   BILITOT 0.4 03/08/2018 0907   BILITOT 0.24 11/02/2017 0756       All questions were answered. The patient knows to call the clinic with any problems, questions or concerns. No barriers to learning was detected.  I spent 15 minutes counseling the  patient face to face. The total time spent in the appointment was 20 minutes and more than 50% was on counseling and review of test results  Heath Lark, MD 03/08/2018 1:06 PM

## 2018-03-08 NOTE — Assessment & Plan Note (Signed)
This is due to cisplatin side effects She will continue magnesium replacement therapy 

## 2018-03-08 NOTE — Assessment & Plan Note (Signed)
I have reviewed recent tumor marker and CT scan with the patient and family Overall, she is responding well to treatment since addition of cisplatin, although that was causing some issues with low blood count requiring blood transfusion. We will continue modified dosing and modify schedule to get chemo every 2 weeks or longer to allow blood count recovery She will continue G-CSF support after day 8 of treatment I plan to see her once a month for further supportive care She does not need transfusion today I have requested her to come in Friday for blood count draw

## 2018-03-08 NOTE — Telephone Encounter (Signed)
Gave avs and calendar ° °

## 2018-03-08 NOTE — Assessment & Plan Note (Signed)
She is profoundly pancytopenic from treatment She does not need blood transfusion today She is educated to watch out for signs and symptoms of bleeding and we discussed neutropenic precautions She will continue G-CSF support with each cycle of treatment

## 2018-03-09 ENCOUNTER — Telehealth: Payer: Self-pay | Admitting: *Deleted

## 2018-03-09 LAB — CA 125: CANCER ANTIGEN (CA) 125: 737.1 U/mL — AB (ref 0.0–38.1)

## 2018-03-09 NOTE — Telephone Encounter (Signed)
Left message with results below. To call if has questions 

## 2018-03-09 NOTE — Telephone Encounter (Signed)
-----   Message from Heath Lark, MD sent at 03/09/2018  8:40 AM EDT ----- Regarding: CA-125 PLs let her know the good news ----- Message ----- From: Interface, Lab In [facility] Sent: 03/08/2018   9:31 AM To: Heath Lark, MD

## 2018-03-12 ENCOUNTER — Inpatient Hospital Stay: Payer: BC Managed Care – PPO

## 2018-03-12 DIAGNOSIS — T451X5A Adverse effect of antineoplastic and immunosuppressive drugs, initial encounter: Secondary | ICD-10-CM

## 2018-03-12 DIAGNOSIS — C481 Malignant neoplasm of specified parts of peritoneum: Secondary | ICD-10-CM

## 2018-03-12 DIAGNOSIS — D6481 Anemia due to antineoplastic chemotherapy: Secondary | ICD-10-CM

## 2018-03-12 DIAGNOSIS — C578 Malignant neoplasm of overlapping sites of female genital organs: Secondary | ICD-10-CM | POA: Diagnosis not present

## 2018-03-12 LAB — CBC WITH DIFFERENTIAL/PLATELET
BASOS PCT: 0 %
Basophils Absolute: 0 10*3/uL (ref 0.0–0.1)
EOS ABS: 0.1 10*3/uL (ref 0.0–0.5)
Eosinophils Relative: 3 %
HEMATOCRIT: 25.4 % — AB (ref 34.8–46.6)
HEMOGLOBIN: 8.3 g/dL — AB (ref 11.6–15.9)
LYMPHS ABS: 0.3 10*3/uL — AB (ref 0.9–3.3)
Lymphocytes Relative: 12 %
MCH: 35.3 pg — ABNORMAL HIGH (ref 25.1–34.0)
MCHC: 32.7 g/dL (ref 31.5–36.0)
MCV: 108.1 fL — ABNORMAL HIGH (ref 79.5–101.0)
MONOS PCT: 19 %
Monocytes Absolute: 0.5 10*3/uL (ref 0.1–0.9)
Neutro Abs: 1.7 10*3/uL (ref 1.5–6.5)
Neutrophils Relative %: 66 %
Platelets: 64 10*3/uL — ABNORMAL LOW (ref 145–400)
RBC: 2.35 MIL/uL — AB (ref 3.70–5.45)
RDW: 19.9 % — ABNORMAL HIGH (ref 11.2–14.5)
WBC: 2.5 10*3/uL — ABNORMAL LOW (ref 3.9–10.3)

## 2018-03-12 LAB — COMPREHENSIVE METABOLIC PANEL
ALBUMIN: 3.8 g/dL (ref 3.5–5.0)
ALK PHOS: 101 U/L (ref 40–150)
ALT: 20 U/L (ref 0–55)
AST: 19 U/L (ref 5–34)
Anion gap: 11 (ref 3–11)
BILIRUBIN TOTAL: 0.3 mg/dL (ref 0.2–1.2)
BUN: 34 mg/dL — ABNORMAL HIGH (ref 7–26)
CALCIUM: 9.6 mg/dL (ref 8.4–10.4)
CO2: 27 mmol/L (ref 22–29)
Chloride: 104 mmol/L (ref 98–109)
Creatinine, Ser: 1.16 mg/dL — ABNORMAL HIGH (ref 0.60–1.10)
GFR calc Af Amer: 56 mL/min — ABNORMAL LOW (ref >60)
GFR calc non Af Amer: 49 mL/min — ABNORMAL LOW (ref >60)
GLUCOSE: 125 mg/dL (ref 70–140)
Potassium: 4.2 mmol/L (ref 3.5–5.1)
SODIUM: 142 mmol/L (ref 136–145)
TOTAL PROTEIN: 6.8 g/dL (ref 6.4–8.3)

## 2018-03-12 LAB — SAMPLE TO BLOOD BANK

## 2018-03-12 LAB — MAGNESIUM: Magnesium: 2.2 mg/dL (ref 1.7–2.4)

## 2018-03-15 ENCOUNTER — Inpatient Hospital Stay: Payer: BC Managed Care – PPO

## 2018-03-15 ENCOUNTER — Other Ambulatory Visit: Payer: BC Managed Care – PPO

## 2018-03-15 ENCOUNTER — Other Ambulatory Visit: Payer: Self-pay | Admitting: Pediatrics

## 2018-03-15 DIAGNOSIS — I10 Essential (primary) hypertension: Secondary | ICD-10-CM

## 2018-03-15 DIAGNOSIS — E785 Hyperlipidemia, unspecified: Secondary | ICD-10-CM

## 2018-03-18 ENCOUNTER — Telehealth: Payer: Self-pay

## 2018-03-18 ENCOUNTER — Other Ambulatory Visit: Payer: Self-pay

## 2018-03-18 DIAGNOSIS — C569 Malignant neoplasm of unspecified ovary: Secondary | ICD-10-CM

## 2018-03-18 MED ORDER — HYDROCODONE-ACETAMINOPHEN 10-325 MG PO TABS: 1.0000 | ORAL_TABLET | ORAL | 0 refills | Status: DC | PRN

## 2018-03-18 NOTE — Telephone Encounter (Signed)
Pt left VM with the following questions:  1. I have a lab appt tomorrow, should I wait around to find out if I need an transfusion?  2. There is a lab appt scheduled for me on Monday before chemo but I am getting labs tomorrow, do I need labs again on Monday before infusion?  3.  I am running out of hydrocodone, can I get a refill?  Will route this note to Dr Alvy Bimler then call pt back .

## 2018-03-18 NOTE — Telephone Encounter (Signed)
  Per my last telephone note, Dr Alvy Bimler addressed pt's questions at my desk:  1. I have a lab appt tomorrow, should I wait around to find out if I need an transfusion?   Yes, wait in lobby for results.   2. There is a lab appt scheduled for me on Monday before chemo but I am getting labs tomorrow, do I need labs again on Monday before infusion?  Depending on results of labs tomorrow, we may cancel the lab appt if your results are good.   3.  I am running out of hydrocodone, can I get a refill?  Dr Alvy Bimler will have prescription ready for tomorrow when you come for the lab appt.   Will route this note to Dr Alvy Bimler then call pt back .   Outgoing call to pt with above responses per Dr Alvy Bimler.  Pt voiced understanding. No other needs per pt at this time.

## 2018-03-19 ENCOUNTER — Telehealth: Payer: Self-pay

## 2018-03-19 ENCOUNTER — Other Ambulatory Visit: Payer: Self-pay | Admitting: Hematology and Oncology

## 2018-03-19 ENCOUNTER — Inpatient Hospital Stay: Payer: BC Managed Care – PPO

## 2018-03-19 DIAGNOSIS — D6481 Anemia due to antineoplastic chemotherapy: Secondary | ICD-10-CM

## 2018-03-19 DIAGNOSIS — C569 Malignant neoplasm of unspecified ovary: Secondary | ICD-10-CM

## 2018-03-19 DIAGNOSIS — C578 Malignant neoplasm of overlapping sites of female genital organs: Secondary | ICD-10-CM | POA: Diagnosis not present

## 2018-03-19 DIAGNOSIS — T451X5A Adverse effect of antineoplastic and immunosuppressive drugs, initial encounter: Secondary | ICD-10-CM

## 2018-03-19 DIAGNOSIS — C481 Malignant neoplasm of specified parts of peritoneum: Secondary | ICD-10-CM

## 2018-03-19 LAB — COMPREHENSIVE METABOLIC PANEL
ALT: 18 U/L (ref 0–55)
AST: 18 U/L (ref 5–34)
Albumin: 4.2 g/dL (ref 3.5–5.0)
Alkaline Phosphatase: 115 U/L (ref 40–150)
Anion gap: 11 (ref 3–11)
BUN: 35 mg/dL — ABNORMAL HIGH (ref 7–26)
CHLORIDE: 105 mmol/L (ref 98–109)
CO2: 25 mmol/L (ref 22–29)
CREATININE: 1.48 mg/dL — AB (ref 0.60–1.10)
Calcium: 10 mg/dL (ref 8.4–10.4)
GFR calc Af Amer: 42 mL/min — ABNORMAL LOW (ref >60)
GFR, EST NON AFRICAN AMERICAN: 36 mL/min — AB (ref >60)
Glucose, Bld: 153 mg/dL — ABNORMAL HIGH (ref 70–140)
Potassium: 4.7 mmol/L (ref 3.5–5.1)
Sodium: 141 mmol/L (ref 136–145)
Total Bilirubin: 0.3 mg/dL (ref 0.2–1.2)
Total Protein: 7.3 g/dL (ref 6.4–8.3)

## 2018-03-19 LAB — CBC WITH DIFFERENTIAL/PLATELET
Basophils Absolute: 0 10*3/uL (ref 0.0–0.1)
Basophils Relative: 0 %
EOS ABS: 0.1 10*3/uL (ref 0.0–0.5)
Eosinophils Relative: 4 %
HEMATOCRIT: 29.9 % — AB (ref 34.8–46.6)
Hemoglobin: 9.6 g/dL — ABNORMAL LOW (ref 11.6–15.9)
LYMPHS ABS: 0.4 10*3/uL — AB (ref 0.9–3.3)
LYMPHS PCT: 15 %
MCH: 35.2 pg — ABNORMAL HIGH (ref 25.1–34.0)
MCHC: 32.1 g/dL (ref 31.5–36.0)
MCV: 109.5 fL — AB (ref 79.5–101.0)
Monocytes Absolute: 0.5 10*3/uL (ref 0.1–0.9)
Monocytes Relative: 16 %
NEUTROS PCT: 65 %
NRBC: 1 /100{WBCs} — AB
Neutro Abs: 2 10*3/uL (ref 1.5–6.5)
PLATELETS: 187 10*3/uL (ref 145–400)
RBC: 2.73 MIL/uL — ABNORMAL LOW (ref 3.70–5.45)
RDW: 20.4 % — ABNORMAL HIGH (ref 11.2–14.5)
WBC: 3 10*3/uL — AB (ref 3.9–10.3)

## 2018-03-19 LAB — SAMPLE TO BLOOD BANK

## 2018-03-19 LAB — MAGNESIUM: Magnesium: 1.5 mg/dL — ABNORMAL LOW (ref 1.7–2.4)

## 2018-03-19 MED FILL — HYDROCODON-APAP 10-325: 10-325 | 15 days supply | Qty: 90 | Fill #0

## 2018-03-19 NOTE — Telephone Encounter (Addendum)
Lab called with hemoglobin results 9.6. Notified Dr Alvy Bimler. Pt in lobby. Per Dr Alvy Bimler, lab results given, let pt know that lab appt for Monday has been canceled, Dr Alvy Bimler is pleased with labs today - increase water intake per her and continue to take Magnesium as previously ordered.  Pt voiced understanding.

## 2018-03-22 ENCOUNTER — Inpatient Hospital Stay: Payer: BC Managed Care – PPO

## 2018-03-22 ENCOUNTER — Inpatient Hospital Stay (HOSPITAL_BASED_OUTPATIENT_CLINIC_OR_DEPARTMENT_OTHER): Payer: BC Managed Care – PPO | Admitting: Medical

## 2018-03-22 VITALS — BP 132/51 | HR 96 | Temp 98.1°F

## 2018-03-22 DIAGNOSIS — M791 Myalgia, unspecified site: Secondary | ICD-10-CM | POA: Diagnosis not present

## 2018-03-22 DIAGNOSIS — G8929 Other chronic pain: Secondary | ICD-10-CM

## 2018-03-22 DIAGNOSIS — R5383 Other fatigue: Secondary | ICD-10-CM | POA: Diagnosis not present

## 2018-03-22 DIAGNOSIS — G629 Polyneuropathy, unspecified: Secondary | ICD-10-CM | POA: Diagnosis not present

## 2018-03-22 DIAGNOSIS — N183 Chronic kidney disease, stage 3 (moderate): Secondary | ICD-10-CM | POA: Diagnosis not present

## 2018-03-22 DIAGNOSIS — M25551 Pain in right hip: Secondary | ICD-10-CM

## 2018-03-22 DIAGNOSIS — I2510 Atherosclerotic heart disease of native coronary artery without angina pectoris: Secondary | ICD-10-CM | POA: Diagnosis not present

## 2018-03-22 DIAGNOSIS — C578 Malignant neoplasm of overlapping sites of female genital organs: Secondary | ICD-10-CM

## 2018-03-22 DIAGNOSIS — Z90722 Acquired absence of ovaries, bilateral: Secondary | ICD-10-CM | POA: Diagnosis not present

## 2018-03-22 DIAGNOSIS — C481 Malignant neoplasm of specified parts of peritoneum: Secondary | ICD-10-CM

## 2018-03-22 DIAGNOSIS — C786 Secondary malignant neoplasm of retroperitoneum and peritoneum: Secondary | ICD-10-CM | POA: Diagnosis not present

## 2018-03-22 DIAGNOSIS — Z79899 Other long term (current) drug therapy: Secondary | ICD-10-CM

## 2018-03-22 DIAGNOSIS — Z794 Long term (current) use of insulin: Secondary | ICD-10-CM

## 2018-03-22 DIAGNOSIS — Z90710 Acquired absence of both cervix and uterus: Secondary | ICD-10-CM | POA: Diagnosis not present

## 2018-03-22 MED ORDER — GEMCITABINE HCL CHEMO INJECTION 1 GM/26.3ML
500.0000 mg/m2 | Freq: Once | INTRAVENOUS | Status: AC
  Administered 2018-03-22: 1026 mg via INTRAVENOUS
  Filled 2018-03-22: qty 26.98

## 2018-03-22 MED ORDER — SODIUM CHLORIDE 0.9% FLUSH
10.0000 mL | INTRAVENOUS | Status: DC | PRN
  Administered 2018-03-22: 10 mL
  Filled 2018-03-22: qty 10

## 2018-03-22 MED ORDER — PEGFILGRASTIM 6 MG/0.6ML ~~LOC~~ PSKT
PREFILLED_SYRINGE | SUBCUTANEOUS | Status: AC
  Filled 2018-03-22: qty 0.6

## 2018-03-22 MED ORDER — SODIUM CHLORIDE 0.9 % IV SOLN
24.5000 mg/m2 | Freq: Once | INTRAVENOUS | Status: AC
  Administered 2018-03-22: 50 mg via INTRAVENOUS
  Filled 2018-03-22: qty 50

## 2018-03-22 MED ORDER — POTASSIUM CHLORIDE 2 MEQ/ML IV SOLN
Freq: Once | INTRAVENOUS | Status: AC
  Administered 2018-03-22: 10:00:00 via INTRAVENOUS
  Filled 2018-03-22: qty 10

## 2018-03-22 MED ORDER — HEPARIN SOD (PORK) LOCK FLUSH 100 UNIT/ML IV SOLN
500.0000 [IU] | Freq: Once | INTRAVENOUS | Status: AC | PRN
  Administered 2018-03-22: 500 [IU]
  Filled 2018-03-22: qty 5

## 2018-03-22 MED ORDER — PEGFILGRASTIM 6 MG/0.6ML ~~LOC~~ PSKT
6.0000 mg | PREFILLED_SYRINGE | Freq: Once | SUBCUTANEOUS | Status: AC
  Administered 2018-03-22: 6 mg via SUBCUTANEOUS

## 2018-03-22 MED ORDER — PALONOSETRON HCL INJECTION 0.25 MG/5ML
0.2500 mg | Freq: Once | INTRAVENOUS | Status: AC
  Administered 2018-03-22: 0.25 mg via INTRAVENOUS

## 2018-03-22 MED ORDER — SODIUM CHLORIDE 0.9 % IV SOLN
Freq: Once | INTRAVENOUS | Status: AC
  Administered 2018-03-22: 12:00:00 via INTRAVENOUS
  Filled 2018-03-22: qty 5

## 2018-03-22 MED ORDER — PALONOSETRON HCL INJECTION 0.25 MG/5ML
INTRAVENOUS | Status: AC
  Filled 2018-03-22: qty 5

## 2018-03-22 MED ORDER — SODIUM CHLORIDE 0.9 % IV SOLN
Freq: Once | INTRAVENOUS | Status: AC
  Administered 2018-03-22: 09:00:00 via INTRAVENOUS

## 2018-03-22 NOTE — Patient Instructions (Signed)
[facility] Discharge Instructions for Patients Receiving Chemotherapy  Today you received the following chemotherapy agents:  Gemzar, Cisplatin  To help prevent nausea and vomiting after your treatment, we encourage you to take your nausea medication as prescribed.   If you develop nausea and vomiting that is not controlled by your nausea medication, call the clinic.   BELOW ARE SYMPTOMS THAT SHOULD BE REPORTED IMMEDIATELY:  *FEVER GREATER THAN 100.5 F  *CHILLS WITH OR WITHOUT FEVER  NAUSEA AND VOMITING THAT IS NOT CONTROLLED WITH YOUR NAUSEA MEDICATION  *UNUSUAL SHORTNESS OF BREATH  *UNUSUAL BRUISING OR BLEEDING  TENDERNESS IN MOUTH AND THROAT WITH OR WITHOUT PRESENCE OF ULCERS  *URINARY PROBLEMS  *BOWEL PROBLEMS  UNUSUAL RASH Items with * indicate a potential emergency and should be followed up as soon as possible.  Feel free to call the clinic should you have any questions or concerns. The clinic phone number is [phone].  Please show the CHEMO ALERT CARD at check-in to the Emergency Department and triage nurse.   

## 2018-03-22 NOTE — Progress Notes (Signed)
Pt c/o new bone/muscle pain in upper legs radiating to hips, predominantly right side. Pt desribes it as throbbing, walking triggers it, hydrocodone& asper cream relieves it, as well as rest, and use of wheelchair.  Per Dr Alvy Bimler ok to tx today, feels r/t history of low WBC counts and neuropathy. Per Dr Alvy Bimler pt offered option to have more scans done, pt refused.    Sandi Mealy PA notified to come to tx area to assess pt.

## 2018-03-23 ENCOUNTER — Telehealth: Payer: Self-pay | Admitting: Pediatrics

## 2018-03-23 ENCOUNTER — Ambulatory Visit (HOSPITAL_COMMUNITY)
Admission: RE | Admit: 2018-03-23 | Discharge: 2018-03-23 | Disposition: A | Discharge status: Home / Self Care | Payer: BC Managed Care – PPO | Source: Ambulatory Visit | Attending: Medical | Admitting: Medical

## 2018-03-23 DIAGNOSIS — M25551 Pain in right hip: Secondary | ICD-10-CM | POA: Diagnosis present

## 2018-03-23 DIAGNOSIS — Z981 Arthrodesis status: Secondary | ICD-10-CM | POA: Insufficient documentation

## 2018-03-23 DIAGNOSIS — E785 Hyperlipidemia, unspecified: Secondary | ICD-10-CM

## 2018-03-23 DIAGNOSIS — M47816 Spondylosis without myelopathy or radiculopathy, lumbar region: Secondary | ICD-10-CM | POA: Insufficient documentation

## 2018-03-23 DIAGNOSIS — I739 Peripheral vascular disease, unspecified: Secondary | ICD-10-CM | POA: Diagnosis not present

## 2018-03-23 DIAGNOSIS — M160 Bilateral primary osteoarthritis of hip: Secondary | ICD-10-CM | POA: Insufficient documentation

## 2018-03-23 DIAGNOSIS — G8929 Other chronic pain: Secondary | ICD-10-CM | POA: Diagnosis present

## 2018-03-23 DIAGNOSIS — M4186 Other forms of scoliosis, lumbar region: Secondary | ICD-10-CM | POA: Diagnosis not present

## 2018-03-23 NOTE — Telephone Encounter (Signed)
Please review and advise Pt requesting refill on Atorvastatin 40mg  Last seen 03/25/2017

## 2018-03-24 ENCOUNTER — Other Ambulatory Visit: Payer: Self-pay | Admitting: Pediatrics

## 2018-03-24 DIAGNOSIS — E785 Hyperlipidemia, unspecified: Secondary | ICD-10-CM

## 2018-03-24 MED ORDER — ATORVASTATIN CALCIUM 40 MG PO TABS: ORAL_TABLET | ORAL | 0 refills | Status: DC

## 2018-03-24 NOTE — Telephone Encounter (Signed)
Patient aware and verbalized understanding. °

## 2018-03-24 NOTE — Progress Notes (Signed)
[facility] Progress Note   Ellen Hunt [id number] 10/01/1951 67 y.o.  Aiden Rao is managed by Dr. Heath Lark  Actively treated with chemotherapy: yes  Current Therapy: Cisplatin and gemcitabine with PEG filgrastim support  Last Treated: 03/22/2018 (cycle 5, day 8) chronic right hip pain:  Assessment: Plan:    Hip pain, chronic, right - Plan: DG Hip Unilat W or W/O Pelvis 1 View Right   Chronic right hip pain: The patient was referred for an x-ray of her right hip.  Please see After Visit Summary for patient specific instructions.  Future Appointments  Date Time Provider [place]  04/02/2018  9:15 AM Philemon Kingdom, MD [facility] None  04/02/2018 10:30 AM [facility] LAB 4 [facility] None  04/02/2018 11:00 AM Heath Lark, MD [facility] None  04/05/2018  8:15 AM [facility] E14 [facility] None  04/07/2018  8:30 AM Eustaquio Maize, MD [facility] None  04/12/2018  8:00 AM [facility] LAB 2 [facility] None  04/12/2018  9:00 AM [facility] A2 [facility] None    Orders Placed This Encounter  Procedures  . DG Hip Unilat W or W/O Pelvis 1 View Right       Subjective:   Patient ID:  Ellen Hunt is a 67 y.o. (DOB [birth year]) female.  Chief Complaint: No chief complaint on file.   HPI Ellen Hunt is a 67 year old female with a diagnosis of a primary peritoneal malignancy who is managed by Dr. Heath Lark and who is currently treated with cisplatin and gemcitabine with Neulasta support.  She is receiving cycle 5, day 8 of therapy today.  She reports having bilateral lower extremity pain which is greater on her right leg and hip than on her left.  Her husband who accompanies her today reports that her hip pain has been long-standing.  She reports her pain is been increased in severity since a fall in March 2019 while traveling.  She has been treating her pain with hydrocodone, Aspercreme, and rest.  She had been on gabapentin 300 mg p.o. 3 times  daily but does taper this to 300 mg once daily due to her concern for swelling of her legs.  Medications: I have reviewed the patient's current medications.  Allergies:  Allergies  Allergen Reactions  . Avandia [Rosiglitazone] Other (See Comments)    Legs swelled  . Micronase [Glyburide] Swelling  . Actos [Pioglitazone] Other (See Comments)    Edema / leg swelling    Past Medical History:  Diagnosis Date  . Diabetes mellitus without complication (HCC)    on insulin pump  . Dysrhythmia   . 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 Family history of breast cancer   . GERD (gastroesophageal reflux disease)   . History of blood transfusion   . History of bronchitis   . History of chemotherapy   . History of urinary tract infection   . Hyperlipidemia   . Hypertension   . Low serum vitamin D   . Ovarian cancer ([place]) 05/09/2016  . Shingles     Past Surgical History:  Procedure Laterality Date  . ABDOMINAL HYSTERECTOMY  10/14/2016  . CESAREAN SECTION    . DEBULKING N/A 10/14/2016   Procedure: DEBULKING;  Surgeon: Everitt Amber, MD;  Location: [facility];  Service: Gynecology;  Laterality: N/A;  . IR PARACENTESIS  06/01/2017  . LAPAROTOMY WITH STAGING N/A 10/14/2016   Procedure: LAPAROTOMY WITH OMENTECTOMY AND TUMOR DEBULGING;  Surgeon: Everitt Amber, MD;  Location: [facility];  Service: Gynecology;  Laterality:  N/A;  . LUMBAR FUSION  08/21/15   L3-L4 Dr. Timmothy Euler  . OMENTECTOMY N/A 10/14/2016   Procedure: OMENTECTOMY;  Surgeon: Everitt Amber, MD;  Location: [facility];  Service: Gynecology;  Laterality: N/A;  . ROBOTIC ASSISTED TOTAL HYSTERECTOMY WITH BILATERAL SALPINGO OOPHERECTOMY Bilateral 10/14/2016   Procedure: XI ROBOTIC ASSISTED TOTAL LAPARSCOPIC  HYSTERECTOMY WITH BILATERAL SALPINGO OOPHORECTOMY;  Surgeon: Everitt Amber, MD;  Location: [facility];  Service: Gynecology;  Laterality: Bilateral;    Family History  Problem Relation Age of Onset  . Lung cancer Mother         smoker; dx in her 72s  . Leukemia Father   . Diabetes Paternal Grandmother   . Heart attack Paternal Grandmother   . Diabetes Paternal Grandfather   . Breast cancer Paternal Aunt        dx in her 57s-30s  . Leukemia Paternal Uncle   . Heart attack Maternal Grandfather   . Breast cancer Cousin        maternal first cousin    Social History   Socioeconomic History  . Marital status: Married    Spouse name: Gershon Mussel  . Number of children: 2  . Years of education: Not on file  . Highest education level: Not on file  Occupational History  . Occupation: retired  Scientific laboratory technician  . Financial resource strain: Not on file  . Food insecurity:    Worry: Not on file    Inability: Not on file  . Transportation needs:    Medical: Not on file    Non-medical: Not on file  Tobacco Use  . Smoking status: Never Smoker  . Smokeless tobacco: Never Used  Substance and Sexual Activity  . Alcohol use: No  . Drug use: No  . Sexual activity: Not on file    Comment: married  Lifestyle  . Physical activity:    Days per week: Not on file    Minutes per session: Not on file  . Stress: Not on file  Relationships  . Social connections:    Talks on phone: Not on file    Gets together: Not on file    Attends religious service: Not on file    Active member of club or organization: Not on file    Attends meetings of clubs or organizations: Not on file    Relationship status: Not on file  . Intimate partner violence:    Fear of current or ex partner: Not on file    Emotionally abused: Not on file    Physically abused: Not on file    Forced sexual activity: Not on file  Other Topics Concern  . Not on file  Social History Narrative  . Not on file    Past Medical History, Surgical history, Social history, and Family history were reviewed and updated as appropriate.   Please see review of systems for further details on the patient's review from today.   Review of Systems:  Review of Systems    Constitutional: Negative for diaphoresis.  HENT: Negative for trouble swallowing.   Respiratory: Negative for cough, choking, chest tightness, shortness of breath and wheezing.   Cardiovascular: Negative for chest pain and palpitations.  Gastrointestinal: Negative for nausea and vomiting.  Musculoskeletal: Positive for arthralgias, gait problem and myalgias. Negative for back pain.  Skin: Negative for color change and rash.  Neurological: Negative for dizziness, light-headedness and headaches.    Objective:   Physical Exam:  There were no vitals taken for this  visit. ECOG: 1  Physical Exam  Constitutional: No distress.  HENT:  Head: Normocephalic and atraumatic.  Cardiovascular: Normal rate, regular rhythm and normal heart sounds. Exam reveals no gallop and no friction rub.  No murmur heard. Pulmonary/Chest: Effort normal and breath sounds normal. No respiratory distress. She has no wheezes. She has no rales.  Musculoskeletal: She exhibits edema (Trace bilateral lower extremity edema.  Calves are bilaterally equal at 38 cm at 10 cm distal to the inferior pole of the patella.).  Neurological: She is alert.  Skin: Skin is warm and dry. No rash noted. She is not diaphoretic. No erythema.    Lab Review:     Component Value Date/Time   NA 141 03/19/2018 1111   NA 139 11/02/2017 0756   K 4.7 03/19/2018 1111   K 4.7 11/02/2017 0756   CL 105 03/19/2018 1111   CO2 25 03/19/2018 1111   CO2 26 11/02/2017 0756   GLUCOSE 153 (H) 03/19/2018 1111   GLUCOSE 140 11/02/2017 0756   BUN 35 (H) 03/19/2018 1111   BUN 23.7 11/02/2017 0756   CREATININE 1.48 (H) 03/19/2018 1111   CREATININE 0.9 11/02/2017 0756   CALCIUM 10.0 03/19/2018 1111   CALCIUM 9.4 11/02/2017 0756   PROT 7.3 03/19/2018 1111   PROT 7.0 11/02/2017 0756   ALBUMIN 4.2 03/19/2018 1111   ALBUMIN 3.5 11/02/2017 0756   AST 18 03/19/2018 1111   AST 18 11/02/2017 0756   ALT 18 03/19/2018 1111   ALT 23 11/02/2017 0756    ALKPHOS 115 03/19/2018 1111   ALKPHOS 108 11/02/2017 0756   BILITOT 0.3 03/19/2018 1111   BILITOT 0.24 11/02/2017 0756   GFRNONAA 36 (L) 03/19/2018 1111   GFRAA 42 (L) 03/19/2018 1111       Component Value Date/Time   WBC 3.0 (L) 03/19/2018 1111   RBC 2.73 (L) 03/19/2018 1111   HGB 9.6 (L) 03/19/2018 1111   HGB 9.6 (L) 11/02/2017 0756   HCT 29.9 (L) 03/19/2018 1111   HCT 30.6 (L) 11/02/2017 0756   PLT 187 03/19/2018 1111   PLT 150 11/02/2017 0756   PLT 40 (LL) 11/18/2016 1642   MCV 109.5 (H) 03/19/2018 1111   MCV 108.5 (H) 11/02/2017 0756   MCH 35.2 (H) 03/19/2018 1111   MCHC 32.1 03/19/2018 1111   RDW 20.4 (H) 03/19/2018 1111   RDW 17.8 (H) 11/02/2017 0756   LYMPHSABS 0.4 (L) 03/19/2018 1111   LYMPHSABS 0.4 (L) 11/02/2017 0756   MONOABS 0.5 03/19/2018 1111   MONOABS 0.5 11/02/2017 0756   EOSABS 0.1 03/19/2018 1111   EOSABS 0.7 (H) 11/02/2017 0756   EOSABS 0.1 11/18/2016 1642   BASOSABS 0.0 03/19/2018 1111   BASOSABS 0.0 11/02/2017 0756   -------------------------------  Imaging from last 24 hours (if applicable):  Radiology interpretation: Dg Hip Unilat W Or W/o Pelvis 1 View Right  Result Date: 03/23/2018 CLINICAL DATA:  Right hip pain.  Pain radiates down right leg. EXAM: DG HIP (WITH OR WITHOUT PELVIS) 1V RIGHT COMPARISON:  No recent. FINDINGS: Prior left lateral and interbody L3-L4 fusion. Degenerative changes and scoliosis lumbar spine. Degenerative changes both hips. No acute bony abnormality identified. No evidence of fracture. Aortoiliac atherosclerotic vascular calcification. Calcified pelvic densities consistent with phleboliths. IMPRESSION: 1. Prior left lateral and interbody L3-L4 fusion. Hardware intact. Anatomic alignment. Degenerative changes scoliosis lumbar spine. 2. Degenerative changes both hips. No acute bony abnormality identified. No evidence of hip fracture. 3.  Peripheral vascular disease. Electronically Signed   By:  Rice   On: 03/23/2018  13:46

## 2018-04-02 ENCOUNTER — Encounter: Payer: Self-pay | Admitting: Internal Medicine

## 2018-04-02 ENCOUNTER — Inpatient Hospital Stay: Payer: BC Managed Care – PPO

## 2018-04-02 ENCOUNTER — Inpatient Hospital Stay (HOSPITAL_BASED_OUTPATIENT_CLINIC_OR_DEPARTMENT_OTHER): Payer: BC Managed Care – PPO | Admitting: Hematology and Oncology

## 2018-04-02 ENCOUNTER — Telehealth: Payer: Self-pay | Admitting: Hematology and Oncology

## 2018-04-02 ENCOUNTER — Ambulatory Visit (INDEPENDENT_AMBULATORY_CARE_PROVIDER_SITE_OTHER): Payer: BC Managed Care – PPO | Admitting: Internal Medicine

## 2018-04-02 ENCOUNTER — Encounter: Payer: Self-pay | Admitting: Hematology and Oncology

## 2018-04-02 VITALS — BP 122/88 | HR 94 | Ht 63.0 in | Wt 216.4 lb

## 2018-04-02 DIAGNOSIS — I2510 Atherosclerotic heart disease of native coronary artery without angina pectoris: Secondary | ICD-10-CM

## 2018-04-02 DIAGNOSIS — D6481 Anemia due to antineoplastic chemotherapy: Secondary | ICD-10-CM

## 2018-04-02 DIAGNOSIS — C481 Malignant neoplasm of specified parts of peritoneum: Secondary | ICD-10-CM

## 2018-04-02 DIAGNOSIS — D61818 Other pancytopenia: Secondary | ICD-10-CM

## 2018-04-02 DIAGNOSIS — T451X5A Adverse effect of antineoplastic and immunosuppressive drugs, initial encounter: Secondary | ICD-10-CM

## 2018-04-02 DIAGNOSIS — Z794 Long term (current) use of insulin: Secondary | ICD-10-CM

## 2018-04-02 DIAGNOSIS — R5383 Other fatigue: Secondary | ICD-10-CM | POA: Diagnosis not present

## 2018-04-02 DIAGNOSIS — G8929 Other chronic pain: Secondary | ICD-10-CM | POA: Diagnosis not present

## 2018-04-02 DIAGNOSIS — Z90722 Acquired absence of ovaries, bilateral: Secondary | ICD-10-CM

## 2018-04-02 DIAGNOSIS — Z9641 Presence of insulin pump (external) (internal): Secondary | ICD-10-CM | POA: Diagnosis not present

## 2018-04-02 DIAGNOSIS — C569 Malignant neoplasm of unspecified ovary: Secondary | ICD-10-CM

## 2018-04-02 DIAGNOSIS — T451X5S Adverse effect of antineoplastic and immunosuppressive drugs, sequela: Secondary | ICD-10-CM | POA: Diagnosis not present

## 2018-04-02 DIAGNOSIS — N183 Chronic kidney disease, stage 3 unspecified: Secondary | ICD-10-CM

## 2018-04-02 DIAGNOSIS — E785 Hyperlipidemia, unspecified: Secondary | ICD-10-CM | POA: Diagnosis not present

## 2018-04-02 DIAGNOSIS — Z90710 Acquired absence of both cervix and uterus: Secondary | ICD-10-CM | POA: Diagnosis not present

## 2018-04-02 DIAGNOSIS — M25551 Pain in right hip: Secondary | ICD-10-CM | POA: Diagnosis not present

## 2018-04-02 DIAGNOSIS — Z79899 Other long term (current) drug therapy: Secondary | ICD-10-CM

## 2018-04-02 DIAGNOSIS — E118 Type 2 diabetes mellitus with unspecified complications: Secondary | ICD-10-CM

## 2018-04-02 DIAGNOSIS — M791 Myalgia, unspecified site: Secondary | ICD-10-CM

## 2018-04-02 DIAGNOSIS — C578 Malignant neoplasm of overlapping sites of female genital organs: Secondary | ICD-10-CM

## 2018-04-02 DIAGNOSIS — G620 Drug-induced polyneuropathy: Secondary | ICD-10-CM | POA: Diagnosis not present

## 2018-04-02 DIAGNOSIS — C786 Secondary malignant neoplasm of retroperitoneum and peritoneum: Secondary | ICD-10-CM

## 2018-04-02 LAB — POCT GLYCOSYLATED HEMOGLOBIN (HGB A1C): HEMOGLOBIN A1C: 5.7 % — AB (ref 4.0–5.6)

## 2018-04-02 LAB — CBC WITH DIFFERENTIAL/PLATELET
BASOS PCT: 0 %
Basophils Absolute: 0 10*3/uL (ref 0.0–0.1)
EOS ABS: 0.4 10*3/uL (ref 0.0–0.5)
EOS PCT: 4 %
HCT: 29.6 % — ABNORMAL LOW (ref 34.8–46.6)
HEMOGLOBIN: 9.5 g/dL — AB (ref 11.6–15.9)
Lymphocytes Relative: 10 %
Lymphs Abs: 1 10*3/uL (ref 0.9–3.3)
MCH: 35.3 pg — ABNORMAL HIGH (ref 25.1–34.0)
MCHC: 32.1 g/dL (ref 31.5–36.0)
MCV: 110 fL — ABNORMAL HIGH (ref 79.5–101.0)
Monocytes Absolute: 0.7 10*3/uL (ref 0.1–0.9)
Monocytes Relative: 7 %
NEUTROS PCT: 79 %
Neutro Abs: 8 10*3/uL — ABNORMAL HIGH (ref 1.5–6.5)
PLATELETS: 56 10*3/uL — AB (ref 145–400)
RBC: 2.69 MIL/uL — AB (ref 3.70–5.45)
RDW: 18.8 % — ABNORMAL HIGH (ref 11.2–14.5)
WBC: 10.1 10*3/uL (ref 3.9–10.3)

## 2018-04-02 LAB — COMPREHENSIVE METABOLIC PANEL
ALBUMIN: 4 g/dL (ref 3.5–5.0)
ALT: 19 U/L (ref 0–55)
ANION GAP: 12 — AB (ref 3–11)
AST: 18 U/L (ref 5–34)
Alkaline Phosphatase: 140 U/L (ref 40–150)
BUN: 35 mg/dL — ABNORMAL HIGH (ref 7–26)
CHLORIDE: 100 mmol/L (ref 98–109)
CO2: 26 mmol/L (ref 22–29)
Calcium: 9.8 mg/dL (ref 8.4–10.4)
Creatinine, Ser: 1.3 mg/dL — ABNORMAL HIGH (ref 0.60–1.10)
GFR calc non Af Amer: 42 mL/min — ABNORMAL LOW (ref >60)
GFR, EST AFRICAN AMERICAN: 49 mL/min — AB (ref >60)
GLUCOSE: 111 mg/dL (ref 70–140)
Potassium: 5 mmol/L (ref 3.5–5.1)
SODIUM: 138 mmol/L (ref 136–145)
Total Bilirubin: 0.3 mg/dL (ref 0.2–1.2)
Total Protein: 6.9 g/dL (ref 6.4–8.3)

## 2018-04-02 LAB — MAGNESIUM: MAGNESIUM: 1.7 mg/dL (ref 1.7–2.4)

## 2018-04-02 LAB — SAMPLE TO BLOOD BANK

## 2018-04-02 MED ORDER — GLUCOSE BLOOD VI STRP: ORAL_STRIP | 3 refills | Status: DC

## 2018-04-02 MED ORDER — HYDROCODONE-ACETAMINOPHEN 10-325 MG PO TABS: 1.0000 | ORAL_TABLET | ORAL | 0 refills | Status: DC | PRN

## 2018-04-02 NOTE — Patient Instructions (Addendum)
Please continue: - Metformin  1000 mg 2x a day  Please use the following pump settings - basal rates: 12 am: 2.35 8 am: 2.20 >> 2.35 5 pm: 2.20 >> 2.35 - ICR: 1:4 >> 1:3.5 - target:   12 am: 115-115   5 am: 100-100  8 pm: 115-115 >> 100-100 - insulin sensitivity factor: 13 - Insulin on Board: 4 h  Please return in 3-4 months with your sugar log.

## 2018-04-02 NOTE — Assessment & Plan Note (Signed)
She has persistent pancytopenia despite multiple dosage adjustment I recommend lengthening her treatment to every 3 weeks She agree with the plan of care I recommend she continues on G-CSF support after each cycle She does not need transfusion today

## 2018-04-02 NOTE — Addendum Note (Signed)
Addended by: Drucilla Schmidt on: 04/02/2018 03:52 PM   Modules accepted: Orders

## 2018-04-02 NOTE — Progress Notes (Signed)
ctosamine   Patient ID: Ellen Hunt, female   DOB: 05/31/1951, 66 y.o.   MRN: [id number]   HPI: Ellen Hunt is a 66 y.o.-year-old female, initially referred by her PCP, Vincent, Carol L, MD, now returning for follow-up for DM2, dx 2002, insulin-dependent, uncontrolled, with complications (peripheral neuropathy, DR, CKD).  Last visit 3 months ago.  She is here with her husband who offers part of the history, especially cancer history and diet.    Patient has a history of ovarian cancer with extraovarian peritoneal carcinomatosis.  She has ascites and a high CA 125, however, this is decreasing.  She is still doing chemotherapy, but a different regimen.  She has low blood counts and has RBC transfusions.  Last hemoglobin was 9.6.  She had an HbA1c of 5.5%, however, at that time, total hemoglobin was very low.  Last hemoglobin A1c was: 12/30/2017: HbA1c calculated from fructosamine is 5.9% (does not appear to be accurate as does not correlate with CBGs at home) Lab Results  Component Value Date   HGBA1C 5.5 09/29/2017   HGBA1C 6.2 (H) 10/09/2016   HGBA1C 6.8 10/03/2015   She is on an insulin pump: -Previously Medtronic 722 - since 2012 -Now Medtronic 630 G without CGM -since 10/2017  She uses NovoLog in the pump  She is on: - Metformin 1000 mg 2x a day  Pump settings: - basal rates: 12 am: 2.10 units/h >> 2.25 >> 2.35 8 am: 2.15 >> 2.25 >> 2.20 5 pm: 1.90 >> 2.10 >> 2.20 - ICR: 8, except 6 with lunch >> 1:4 - target:   12 am: 115-115  5 am: 100-100  8 pm: 115-115 - insulin sensitivity factor: 13 - Insulin on Board: 4 h TDD from basal insulin: 65% >> 50% >> 51% (54 units) TDD from bolus insulin: 35% >> 50% >> 49% (52 units) TDD 76.4 >> up to 100 units >> up to 120 units - extended bolusing: not using - changes infusion site: q 2 days - Meter: One Touch Ultra 2  Pt checks her sugars 3.9x a day and they are - ave 206 +/- 70 >> 194+/-72: - am: 117-158, 166, 181 >> 124-280,  318 >> 129-184, 300 - 2h after b'fast: n/c - before lunch: 81-136, 156 >> 113-215 >> 135-217 - 2h after lunch: n/c - before dinner: 86-179, 185 >> 143-242, 376 >> 93- 206, 312, 340 - 2h after dinner: 145 >> 153-215, >400 >> 157-274 - bedtime: n/c - nighttime: 155-168 >> 170-344 >> 168, 227, 396 Lowest sugar was 60s >> 113 >> 93; she has hypoglycemia awareness in the 70s.  No previous  hypoglycemia admission.  She does have a  glucagon kit at home.   Higher sugars: 400 >> 396.   Pt's meals are: - Breakfast: egg + raisin bread + sometimes bacon - Lunch: varies, may eat out; tomato + cottage cheese - Dinner: meat + veggies + starch - Snacks: bananas, oranges, PB, icecream Caffeine-free diet coke, Sprite 0  -+ CKD, last BUN/creatinine:  Lab Results  Component Value Date   BUN 35 (H) 03/19/2018   BUN 34 (H) 03/12/2018   CREATININE 1.48 (H) 03/19/2018   CREATININE 1.16 (H) 03/12/2018  On benazepril. -+ Dyslipidemia; last set of lipids: Lab Results  Component Value Date   CHOL 129 03/29/2015   HDL 46 03/29/2015   LDLCALC 52 03/29/2015   LDLDIRECT 57 03/29/2015   TRIG 157 (H) 03/29/2015   CHOLHDL 2.8 03/29/2015  On Lipitor. - + Numbness   and tingling in her feet. Was on Gabapentin 100 mg 2x a day >> swelling. Now off. - Last eye exam: Summer 2018: + reportedly DR. + cataract  ROS: Constitutional: no weight gain/no weight loss, no fatigue, no subjective hyperthermia, no subjective hypothermia Eyes: no blurry vision, no xerophthalmia ENT: no sore throat, no nodules palpated in throat, no dysphagia, no odynophagia, no hoarseness Cardiovascular: no CP/no SOB/no palpitations/no leg swelling Respiratory: no cough/no SOB/no wheezing Gastrointestinal: no N/no V/no D/no C/no acid reflux Musculoskeletal: + Muscle aches/+ joint aches Skin: no rashes, no hair loss Neurological: no tremors/+ numbness/+ tingling/no dizziness  I reviewed pt's medications, allergies, PMH, social hx, family  hx, and changes were documented in the history of present illness. Otherwise, unchanged from my initial visit note.  Past Medical History:  Diagnosis Date  . Diabetes mellitus without complication (HCC)    on insulin pump  . Dysrhythmia   . 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[facility]) 05/09/2016  . Family history of breast cancer   . GERD (gastroesophageal reflux disease)   . History of blood transfusion   . History of bronchitis   . History of chemotherapy   . History of urinary tract infection   . Hyperlipidemia   . Hypertension   . Low serum vitamin D   . Ovarian cancer ([facility]) 05/09/2016  . Shingles    Past Surgical History:  Procedure Laterality Date  . ABDOMINAL HYSTERECTOMY  10/14/2016  . CESAREAN SECTION    . DEBULKING N/A 10/14/2016   Procedure: DEBULKING;  Surgeon: Emma Rossi, MD;  Location: [facility];  Service: Gynecology;  Laterality: N/A;  . IR PARACENTESIS  06/01/2017  . LAPAROTOMY WITH STAGING N/A 10/14/2016   Procedure: LAPAROTOMY WITH OMENTECTOMY AND TUMOR DEBULGING;  Surgeon: Emma Rossi, MD;  Location: [facility];  Service: Gynecology;  Laterality: N/A;  . LUMBAR FUSION  08/21/15   L3-L4 Dr. Roy Morehead  . OMENTECTOMY N/A 10/14/2016   Procedure: OMENTECTOMY;  Surgeon: Emma Rossi, MD;  Location: [facility];  Service: Gynecology;  Laterality: N/A;  . ROBOTIC ASSISTED TOTAL HYSTERECTOMY WITH BILATERAL SALPINGO OOPHERECTOMY Bilateral 10/14/2016   Procedure: XI ROBOTIC ASSISTED TOTAL LAPARSCOPIC  HYSTERECTOMY WITH BILATERAL SALPINGO OOPHORECTOMY;  Surgeon: Emma Rossi, MD;  Location: [facility];  Service: Gynecology;  Laterality: Bilateral;   Social History   Socioeconomic History  . Marital status: Married    Spouse name: [name]  . Number of children: 2  Social Needs  Occupational History  . Occupation: Retired; fourth grade teacher  Tobacco Use  . Smoking status: Never Smoker  . Smokeless tobacco: Never Used  Substance and Sexual Activity  . Alcohol use: No  . Drug use: No   . Sexual activity: Not on file    Comment: married   Current Outpatient Medications on File Prior to Visit  Medication Sig Dispense Refill  . atorvastatin (LIPITOR) 40 MG tablet TAKE 1 TABLET(40 MG) BY MOUTH DAILY 90 tablet 0  . benazepril (LOTENSIN) 10 MG tablet Take 5 mg by mouth daily.     . Biotin 1000 MCG tablet Take 1,000 mcg by mouth 3 (three) times daily.    . furosemide (LASIX) 20 MG tablet TAKE 1 TABLET(20 MG) BY MOUTH THREE TIMES DAILY 90 tablet 0  . gabapentin (NEURONTIN) 300 MG capsule Take 1 capsule (300 mg total) by mouth 3 (three) times daily. (Patient not taking: Reported on 12/30/2017) 90 capsule 11  . glucose blood (ONE TOUCH ULTRA TEST) test strip USE TO CHECK BLOOD SUGAR UP TO   5 TIMES A DAY 450 each 0  . HYDROcodone-acetaminophen (NORCO) 10-325 MG tablet Take 1 tablet by mouth every 4 (four) hours as needed. 90 tablet 0  . insulin aspart (NOVOLOG) 100 UNIT/ML injection USE IN INSULIN PUMP AS DIRECTED. MAX DAILY DOSE OF 110 UNITS PER DAY 110 mL 2  . lidocaine-prilocaine (EMLA) cream Apply a quarter size amount to port site 1 hour prior to chemo. Do not rub in. Cover with plastic wrap. 30 g 3  . loperamide (IMODIUM A-D) 2 MG tablet Take 1 tablet (2 mg total) by mouth 4 (four) times daily as needed for diarrhea or loose stools. 30 tablet 0  . loratadine (CLARITIN) 10 MG tablet Take 10 mg by mouth daily.    . magnesium oxide (MAG-OX) 400 (241.3 Mg) MG tablet Take 1 tablet (400 mg total) by mouth 2 (two) times daily. (Patient taking differently: Take 400 mg by mouth 3 (three) times daily. ) 60 tablet 0  . metFORMIN (GLUCOPHAGE) 1000 MG tablet TAKE 1 TABLET BY MOUTH TWICE DAILY WITH A MEAL 180 tablet 3  . Multiple Vitamin (MULTIVITAMIN WITH MINERALS) TABS Take 1 tablet by mouth daily.    . multivitamin-lutein (OCUVITE-LUTEIN) CAPS capsule Take 1 capsule by mouth daily.    Marland Kitchen omeprazole (PRILOSEC) 40 MG capsule Take 1 capsule (40 mg total) daily by mouth. 90 capsule 3  .  ondansetron (ZOFRAN) 8 MG tablet Take 1 tablet (8 mg total) by mouth every 8 (eight) hours as needed for nausea or vomiting. 90 tablet 3  . polyethylene glycol (MIRALAX / GLYCOLAX) packet Take 17 g by mouth daily as needed for mild constipation.     . prochlorperazine (COMPAZINE) 10 MG tablet Take 1 tablet (10 mg total) by mouth every 6 (six) hours as needed for nausea or vomiting. 30 tablet 0  . senna (SENOKOT) 8.6 MG TABS tablet Take 1 tablet (8.6 mg total) by mouth at bedtime as needed for moderate constipation. 120 each 0  . vitamin B-12 (CYANOCOBALAMIN) 1000 MCG tablet Take 1,000 mcg by mouth daily.    . vitamin E (VITAMIN E) 400 UNIT capsule Take 400 Units by mouth daily.     No current facility-administered medications on file prior to visit.    Allergies  Allergen Reactions  . Avandia [Rosiglitazone] Other (See Comments)    Legs swelled  . Micronase [Glyburide] Swelling  . Actos [Pioglitazone] Other (See Comments)    Edema / leg swelling   Family History  Problem Relation Age of Onset  . Lung cancer Mother        smoker; dx in her 54s  . Leukemia Father   . Diabetes Paternal Grandmother   . Heart attack Paternal Grandmother   . Diabetes Paternal Grandfather   . Breast cancer Paternal Aunt        dx in her 32s-30s  . Leukemia Paternal Uncle   . Heart attack Maternal Grandfather   . Breast cancer Cousin        maternal first cousin    PE: BP 122/88   Pulse 94   Ht 5' 3" (1.6 m)   Wt 216 lb 6.4 oz (98.2 kg)   SpO2 94%   BMI 38.33 kg/m  Wt Readings from Last 3 Encounters:  04/02/18 216 lb 6.4 oz (98.2 kg)  03/08/18 218 lb 9.6 oz (99.2 kg)  02/08/18 220 lb 9.6 oz (100.1 kg)   Constitutional: overweight, in NAD Eyes: PERRLA, EOMI, no exophthalmos ENT: moist mucous membranes, no thyromegaly,  no cervical lymphadenopathy Cardiovascular: tachycardia, RR, No MRG, + bilateral LE swelling Respiratory: CTA B Gastrointestinal: abdomen soft, NT, ND, BS+ Musculoskeletal: no  deformities, strength intact in all 4 Skin: moist, warm, no rashes Neurological: no tremor with outstretched hands, DTR normal in all 4  ASSESSMENT: 1. DM1, uncontrolled, with complications - Peripheral neuropathy - CKD - DR  2. HL  PLAN:  1. Patient with long-standing, fairly well-controlled type 2 diabetes, on insulin pump therapy.  Her sugars were higher during chemotherapy as she was receiving dexamethasone along with the treatments.  After chemotherapy, sugars were  improving, with only slightly higher blood sugars in the morning but otherwise sugars were close to target later in the day.  At last visit, we continued metformin and I adjusted her basal rates up.  I also advised her to use a lower insulin to carb ratio with lunch to avoid hyperglycemia after this meal.  -  At this visit, sugars are better, especially in the morning after increasing her basal rates at last visit.  Since then she adjusted down her insulin to carb ratio, which also helped.  However, sugars still increase throughout the day and we discussed about decreasing her insulin to carb ratio  from 4 to 3.5  - Since her sugars are higher during the day I also advised her to increase her basal rates throughout the day to equal the one at night, at 2.35. - As sugars are highest after dinner, I will also decrease her target after 8 PM - We will continue metformin for now - Her insurance will soon cover only Humalog so we will need to change to this insulin.  They will let me know when to send the new prescription. - I suggested:  Patient Instructions  Please continue: - Metformin  1000 mg 2x a day  Please use the following pump settings - basal rates: 12 am: 2.35 8 am: 2.20 >> 2.35 5 pm: 2.20 >> 2.35 - ICR: 1:4 >> 1:3.5 - target:   12 am: 115-115   5 am: 100-100  8 pm: 115-115 >> 100-100 - insulin sensitivity factor: 13 - Insulin on Board: 4 h  Please return in 3-4 months with your sugar log.   - today,  HbA1c is 5.7% (slightly higher, but otherwise very good, however, this is not correlating with the average blood sugars at home, which is 194) - continue checking sugars at different times of the day - check 4x a day, rotating checks - advised for yearly eye exams >> she is UTD - Return to clinic in 3 mo with sugar log   2. HL - Reviewed latest lipid panel from 2016: at goal - Continues Lipitor without side effects. - She will need a new lipid panel, she has an appointment with PCP in June  - time spent with the patient: 40 min, of which >50% was spent in reviewing her pump and meter downloads, discussing her hyper-glycemic episodes, reviewing previous labs and pump settings and developing a plan to avoid hypo- and hyper-glycemia.    Philemon Kingdom, MD PhD [facility]

## 2018-04-02 NOTE — Assessment & Plan Note (Addendum)
She had history of peripheral neuropathy due to chemotherapy and diabetes Continue to monitor closely This is stable since recent dose adjustment She will continue pain medicine as prescribed

## 2018-04-02 NOTE — Progress Notes (Signed)
[facility] OFFICE PROGRESS NOTE  Patient Care Team: Eustaquio Maize, MD as PCP - General (Pediatrics)  ASSESSMENT & PLAN:  Extraovarian primary peritoneal carcinoma [facility] I have reviewed recent tumor marker and CT scan with the patient and family Overall, she is responding well to treatment since addition of cisplatin, although that was causing some issues with low blood count requiring transfusion. We will continue modified dosing and I plan to modify her schedule further to get chemo every 3 weeks to allow blood count recovery She will continue G-CSF support after treatment I plan to see her once a month for further supportive care She does not need transfusion today I will cancel her treatment next week due to pancytopenia I have requested her to come in Friday for blood count draw before her treatment the following week If her tumor marker continues to respond to treatment, I do not plan to repeat imaging study until September of this year  Chemotherapy-induced neuropathy ([place]) She had history of peripheral neuropathy due to chemotherapy and diabetes Continue to monitor closely This is stable since recent dose adjustment She will continue pain medicine as prescribed  Pancytopenia, acquired ([place]) She has persistent pancytopenia despite multiple dosage adjustment I recommend lengthening her treatment to every 3 weeks She agree with the plan of care I recommend she continues on G-CSF support after each cycle She does not need transfusion today  CKD (chronic kidney disease), stage III ([place]) She has chronic kidney disease stage III, stable She will continue risk factor modification   No orders of the defined types were placed in this encounter.   INTERVAL HISTORY: Please see below for problem oriented charting. She returns for further follow-up Her peripheral neuropathy is stable The leg pain has resolved Recent imaging of her hip show degenerative  arthritis She denies recent infection No recent nausea, vomiting or changes in bowel habits The patient denies any recent signs or symptoms of bleeding such as spontaneous epistaxis, hematuria or hematochezia.  SUMMARY OF ONCOLOGIC HISTORY: Oncology History   Negative genetic testing ER PR positive, Her 2 neu negative     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[place])   04/29/2016 Imaging    CT abd/pelvis- Extensive omental caking as well as moderate amount of ascites within the abdomen most compatible with peritoneal metastatic disease, of unknown primary. This may potentially be ovarian or a GI in etiology.      04/30/2016 Tumor Marker    CA 125- 7149.0 (H)      05/01/2016 Procedure    US paracentesis- Successful ultrasound-guided paracentesis yielding 1.8 liters of peritoneal fluid.      05/01/2016 Imaging    US pelvis- Both transabdominal and transvaginal sonography are significantly limited by large patient habitus and ascites. Neither uterus or ovaries were visualized on this exam.      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      05/08/2016 Imaging    CT chest- No evidence of metastatic disease in the chest. Peritoneal/omental disease with abdominal ascites in the upper abdomen, incompletely visualized.       05/13/2016 Procedure    Placement of single lumen port a cath via right internal jugular vein. The catheter tip lies at the cavoatrial junction. A power injectable port a cath was placed and is ready for immediate use.      05/15/2016 Procedure    US Paracentesis- 3400 ml  yellow colored ascites removed      05/15/2016 - 09/18/2016 Chemotherapy    Carboplatin/Paclitaxel every 21 days x 7 cycles      07/01/2016 Miscellaneous    Genetic Counseling by [name]-  Genetic testing was normal, and did not reveal a deleterious mutation in these  genes.       07/08/2016 Imaging    CT CAP- 1. Small volume ascites, significantly decreased. 2. Stable diffuse omental soft tissue caking and diffuse peritoneal thickening along the bilateral paracolic gutters and bilateral pelvic peritoneal reflections, consistent with peritoneal carcinomatosis. 3. Stable asymmetrically enlarged right ovary, which may represent the primary site of ovarian malignancy. 4. No evidence of metastatic disease in the chest. No new sites of metastatic disease in the abdomen or pelvis.      07/09/2016 Miscellaneous    Gyn Onc re-evaluation- modest response to therapy, 3 more cycles of chemotherapy recommended.        09/11/2016 Imaging    CT C/A/P No significant change omental soft tissue caking, consistent with metastatic disease. Mild ascites is decreased since previous study.  Increased calcification along peritoneal surface in pelvic cul-de-sac, consistent with treated peritoneal metastatic disease.  Stable 4.5cm homogeneous right pelvic mass, which favors a uterine fibroid although right ovarian neoplasm cannot definitely be excluded.  No new or progressive metastatic disease identified. No evidence of metastatic disease within the thorax.       10/14/2016 Procedure    Robotic-assisted laparoscopic total hysterectomy with bilateral salpingoophorectomy, ex lap omentectomy, radical tumor debulking by Dr. Denman George      10/17/2016 Pathology Results    Diagnosis 1. 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 - CERVIX AND ENDOMETRIUM FREE OF TUMOR. - SEE ONCOLOGY TABLE AND COMMENT. 2. Soft tissue, biopsy, umbilical nodule - HIGH GRADE SEROUS CARCINOMA. 3. Omentum, resection for tumor - HIGH GRADE SEROUS CARCINOMA, 33 CM.      11/06/2016 - 12/25/2016 Chemotherapy    Carboplatin/Paclitaxel x 3 cycles       01/12/2017 Imaging    CT CAP- 1. Interval  hysterectomy, bilateral salpingo-oophorectomy and omentectomy without evidence of tumor recurrence. 2. No evidence of metastatic disease. 3. 5 mm nonobstructing lower pole left renal calculus.      01/13/2017 Remission    No evidence of residual disease on CT imaging.      05/04/2017 Imaging    CT CAP- New small amount of ascites within the abdomen and pelvis since 01/12/2017 which could indicate disease progression but no new identifiable tumor and no significant change in omental and mild peritoneal thickening.  No evidence of metastatic disease within the chest.  Coronary artery disease.  Aortic Atherosclerosis (ICD10-I70.0).      05/04/2017 Tumor Marker    Patient's tumor was tested for the following markers: CA 125 Results of the tumor marker test revealed 7824      05/18/2017 Imaging    ECHO; EF 60% -  65      05/25/2017 - 07/07/2017 Chemotherapy    She received Doxil and Avastin. Treatment is stopped due to disease progression      06/01/2017 Procedure    Successful ultrasound-guided therapeutic paracentesis yielding 2.8 liters of peritoneal fluid      06/22/2017 Tumor Marker    Patient's tumor was tested for the following markers: CA 125 Results of the tumor marker test revealed 13440      07/20/2017 Imaging    Mild increase  in peritoneal carcinoma within abdomen pelvis since previous study. No significant change and minimal ascites.  No evidence of metastatic disease within the thorax. New mild airspace opacity in left lower lobe, consistent with inflammatory or infectious etiology.      07/30/2017 Tumor Marker    Patient's tumor was tested for the following markers: CA 125 Results of the tumor marker test revealed 12099      07/30/2017 -  Chemotherapy    The patient had gemzar. Cisplatin is added on 11/02/18      08/03/2017 - 08/05/2017 Hospital Admission    She was admitted to the hospital for management of UTI and neutropenic fever      08/13/2017 Adverse  Reaction    Dose of chemotherapy is reduced due to neutropenic sepsis      08/24/2017 Tumor Marker    Patient's tumor was tested for the following markers: CA 125 Results of the tumor marker test revealed 7431      09/21/2017 Tumor Marker    Patient's tumor was tested for the following markers: CA 125 Results of the tumor marker test revealed 4176      10/09/2017 Imaging    No significant change in peritoneal carcinomatosis since previous study.  No new or progressive metastatic disease within the abdomen or pelvis.  Stable tiny nonobstructive left renal calculus. No evidence of ureteral calculi or hydronephrosis.      11/10/2017 Tumor Marker    Patient's tumor was tested for the following markers: CA 125 Results of the tumor marker test revealed 4257      11/24/2017 Tumor Marker    Patient's tumor was tested for the following markers: CA 125 Results of the tumor marker test revealed 3377      01/12/2018 Tumor Marker    Patient's tumor was tested for the following markers: CA 125 Results of the tumor marker test revealed 1595      01/21/2018 Imaging    1. Stable to slightly improved omental, gastrohepatic ligament and pelvic disease as detailed above. No new/progressive findings. 2. No acute abdominal or pelvic findings. 3. Stable lower pole left renal calculus.      02/08/2018 Tumor Marker    Patient's tumor was tested for the following markers: CA 125 Results of the tumor marker test revealed 1058      03/08/2018 Tumor Marker    Patient's tumor was tested for the following markers: CA-125 Results of the tumor marker test revealed 737.1       REVIEW OF SYSTEMS:   Constitutional: Denies fevers, chills or abnormal weight loss Eyes: Denies blurriness of vision Ears, nose, mouth, throat, and face: Denies mucositis or sore throat Respiratory: Denies cough, dyspnea or wheezes Cardiovascular: Denies palpitation, chest discomfort or lower extremity  swelling Gastrointestinal:  Denies nausea, heartburn or change in bowel habits Skin: Denies abnormal skin rashes Lymphatics: Denies new lymphadenopathy or easy bruising Neurological:Denies numbness, tingling or new weaknesses Behavioral/Psych: Mood is stable, no new changes  All other systems were reviewed with the patient and are negative.  I have reviewed the past medical history, past surgical history, social history and family history with the patient and they are unchanged from previous note.  ALLERGIES:  is allergic to avandia [rosiglitazone]; micronase [glyburide]; and actos [pioglitazone].  MEDICATIONS:  Current Outpatient Medications  Medication Sig Dispense Refill  . atorvastatin (LIPITOR) 40 MG tablet TAKE 1 TABLET(40 MG) BY MOUTH DAILY 90 tablet 0  . benazepril (LOTENSIN) 10 MG tablet Take 5 mg by mouth  daily.     . Biotin 1000 MCG tablet Take 1,000 mcg by mouth 3 (three) times daily.    . furosemide (LASIX) 20 MG tablet TAKE 1 TABLET(20 MG) BY MOUTH THREE TIMES DAILY 90 tablet 0  . gabapentin (NEURONTIN) 300 MG capsule Take 1 capsule (300 mg total) by mouth 3 (three) times daily. 90 capsule 11  . glucose blood (ONE TOUCH ULTRA TEST) test strip USE TO CHECK BLOOD SUGAR UP TO 5 TIMES A DAY 500 each 3  . HYDROcodone-acetaminophen (NORCO) 10-325 MG tablet Take 1 tablet by mouth every 4 (four) hours as needed. 90 tablet 0  . insulin aspart (NOVOLOG) 100 UNIT/ML injection USE IN INSULIN PUMP AS DIRECTED. MAX DAILY DOSE OF 110 UNITS PER DAY 110 mL 2  . lidocaine-prilocaine (EMLA) cream Apply a quarter size amount to port site 1 hour prior to chemo. Do not rub in. Cover with plastic wrap. 30 g 3  . loperamide (IMODIUM A-D) 2 MG tablet Take 1 tablet (2 mg total) by mouth 4 (four) times daily as needed for diarrhea or loose stools. 30 tablet 0  . loratadine (CLARITIN) 10 MG tablet Take 10 mg by mouth daily.    . magnesium oxide (MAG-OX) 400 (241.3 Mg) MG tablet Take 1 tablet (400 mg  total) by mouth 2 (two) times daily. (Patient taking differently: Take 400 mg by mouth 3 (three) times daily. ) 60 tablet 0  . metFORMIN (GLUCOPHAGE) 1000 MG tablet TAKE 1 TABLET BY MOUTH TWICE DAILY WITH A MEAL 180 tablet 3  . Multiple Vitamin (MULTIVITAMIN WITH MINERALS) TABS Take 1 tablet by mouth daily.    . multivitamin-lutein (OCUVITE-LUTEIN) CAPS capsule Take 1 capsule by mouth daily.    Marland Kitchen omeprazole (PRILOSEC) 40 MG capsule Take 1 capsule (40 mg total) daily by mouth. 90 capsule 3  . ondansetron (ZOFRAN) 8 MG tablet Take 1 tablet (8 mg total) by mouth every 8 (eight) hours as needed for nausea or vomiting. 90 tablet 3  . polyethylene glycol (MIRALAX / GLYCOLAX) packet Take 17 g by mouth daily as needed for mild constipation.     . prochlorperazine (COMPAZINE) 10 MG tablet Take 1 tablet (10 mg total) by mouth every 6 (six) hours as needed for nausea or vomiting. 30 tablet 0  . senna (SENOKOT) 8.6 MG TABS tablet Take 1 tablet (8.6 mg total) by mouth at bedtime as needed for moderate constipation. 120 each 0  . vitamin B-12 (CYANOCOBALAMIN) 1000 MCG tablet Take 1,000 mcg by mouth daily.    . vitamin E (VITAMIN E) 400 UNIT capsule Take 400 Units by mouth daily.     No current facility-administered medications for this visit.     PHYSICAL EXAMINATION: ECOG PERFORMANCE STATUS: 1 - Symptomatic but completely ambulatory  Vitals:   04/02/18 1105  BP: (!) 114/43  Pulse: 88  Resp: 18  Temp: 98.4 F (36.9 C)  SpO2: 96%   Filed Weights   04/02/18 1105  Weight: 216 lb 6.4 oz (98.2 kg)    GENERAL:alert, no distress and comfortable SKIN: skin color, texture, turgor are normal, no rashes or significant lesions EYES: normal, Conjunctiva are pink and non-injected, sclera clear OROPHARYNX:no exudate, no erythema and lips, buccal mucosa, and tongue normal  NECK: supple, thyroid normal size, non-tender, without nodularity LYMPH:  no palpable lymphadenopathy in the cervical, axillary or  inguinal LUNGS: clear to auscultation and percussion with normal breathing effort HEART: regular rate & rhythm and no murmurs and no lower extremity edema  ABDOMEN:abdomen soft, non-tender and normal bowel sounds Musculoskeletal:no cyanosis of digits and no clubbing  NEURO: alert & oriented x 3 with fluent speech, no focal motor/sensory deficits  LABORATORY DATA:  I have reviewed the data as listed    Component Value Date/Time   NA 138 04/02/2018 1023   NA 139 11/02/2017 0756   K 5.0 04/02/2018 1023   K 4.7 11/02/2017 0756   CL 100 04/02/2018 1023   CO2 26 04/02/2018 1023   CO2 26 11/02/2017 0756   GLUCOSE 111 04/02/2018 1023   GLUCOSE 140 11/02/2017 0756   BUN 35 (H) 04/02/2018 1023   BUN 23.7 11/02/2017 0756   CREATININE 1.30 (H) 04/02/2018 1023   CREATININE 0.9 11/02/2017 0756   CALCIUM 9.8 04/02/2018 1023   CALCIUM 9.4 11/02/2017 0756   PROT 6.9 04/02/2018 1023   PROT 7.0 11/02/2017 0756   ALBUMIN 4.0 04/02/2018 1023   ALBUMIN 3.5 11/02/2017 0756   AST 18 04/02/2018 1023   AST 18 11/02/2017 0756   ALT 19 04/02/2018 1023   ALT 23 11/02/2017 0756   ALKPHOS 140 04/02/2018 1023   ALKPHOS 108 11/02/2017 0756   BILITOT 0.3 04/02/2018 1023   BILITOT 0.24 11/02/2017 0756   GFRNONAA 42 (L) 04/02/2018 1023   GFRAA 49 (L) 04/02/2018 1023    No results found for: SPEP, UPEP  Lab Results  Component Value Date   WBC 10.1 04/02/2018   NEUTROABS 8.0 (H) 04/02/2018   HGB 9.5 (L) 04/02/2018   HCT 29.6 (L) 04/02/2018   MCV 110.0 (H) 04/02/2018   PLT 56 (L) 04/02/2018      Chemistry      Component Value Date/Time   NA 138 04/02/2018 1023   NA 139 11/02/2017 0756   K 5.0 04/02/2018 1023   K 4.7 11/02/2017 0756   CL 100 04/02/2018 1023   CO2 26 04/02/2018 1023   CO2 26 11/02/2017 0756   BUN 35 (H) 04/02/2018 1023   BUN 23.7 11/02/2017 0756   CREATININE 1.30 (H) 04/02/2018 1023   CREATININE 0.9 11/02/2017 0756      Component Value Date/Time   CALCIUM 9.8 04/02/2018  1023   CALCIUM 9.4 11/02/2017 0756   ALKPHOS 140 04/02/2018 1023   ALKPHOS 108 11/02/2017 0756   AST 18 04/02/2018 1023   AST 18 11/02/2017 0756   ALT 19 04/02/2018 1023   ALT 23 11/02/2017 0756   BILITOT 0.3 04/02/2018 1023   BILITOT 0.24 11/02/2017 0756       RADIOGRAPHIC STUDIES: I have personally reviewed the radiological images as listed and agreed with the findings in the report. Dg Hip Unilat W Or W/o Pelvis 1 View Right  Result Date: 03/23/2018 CLINICAL DATA:  Right hip pain.  Pain radiates down right leg. EXAM: DG HIP (WITH OR WITHOUT PELVIS) 1V RIGHT COMPARISON:  No recent. FINDINGS: Prior left lateral and interbody L3-L4 fusion. Degenerative changes and scoliosis lumbar spine. Degenerative changes both hips. No acute bony abnormality identified. No evidence of fracture. Aortoiliac atherosclerotic vascular calcification. Calcified pelvic densities consistent with phleboliths. IMPRESSION: 1. Prior left lateral and interbody L3-L4 fusion. Hardware intact. Anatomic alignment. Degenerative changes scoliosis lumbar spine. 2. Degenerative changes both hips. No acute bony abnormality identified. No evidence of hip fracture. 3.  Peripheral vascular disease. Electronically Signed   By: Marcello Moores  Register   On: 03/23/2018 13:46    All questions were answered. The patient knows to call the clinic with any problems, questions or concerns. No barriers to  learning was detected.  I spent 20 minutes counseling the patient face to face. The total time spent in the appointment was 30 minutes and more than 50% was on counseling and review of test results  Heath Lark, MD 04/02/2018 12:07 PM

## 2018-04-02 NOTE — Assessment & Plan Note (Signed)
I have reviewed recent tumor marker and CT scan with the patient and family Overall, she is responding well to treatment since addition of cisplatin, although that was causing some issues with low blood count requiring transfusion. We will continue modified dosing and I plan to modify her schedule further to get chemo every 3 weeks to allow blood count recovery She will continue G-CSF support after treatment I plan to see her once a month for further supportive care She does not need transfusion today I will cancel her treatment next week due to pancytopenia I have requested her to come in Friday for blood count draw before her treatment the following week If her tumor marker continues to respond to treatment, I do not plan to repeat imaging study until September of this year

## 2018-04-02 NOTE — Assessment & Plan Note (Signed)
She has chronic kidney disease stage III, stable She will continue risk factor modification 

## 2018-04-02 NOTE — Telephone Encounter (Signed)
Patient declined avs and calendar of upcoming appointments. Will receive updated copy of schedule in treatment area.

## 2018-04-03 LAB — CA 125: Cancer Antigen (CA) 125: 542.8 U/mL — ABNORMAL HIGH (ref 0.0–38.1)

## 2018-04-05 ENCOUNTER — Telehealth: Payer: Self-pay | Admitting: *Deleted

## 2018-04-05 ENCOUNTER — Inpatient Hospital Stay: Payer: Medicare Other

## 2018-04-05 NOTE — Telephone Encounter (Signed)
LM to call Dr Gorsuch's nurse 

## 2018-04-05 NOTE — Telephone Encounter (Signed)
-----   Message from Heath Lark, MD sent at 04/05/2018  9:25 AM EDT ----- Regarding: CA-125 pls let her know CA-125 is still coming down to over 500 ----- Message ----- From: Interface, Lab In [place] Sent: 04/02/2018  10:47 AM To: Heath Lark, MD

## 2018-04-05 NOTE — Telephone Encounter (Signed)
Notified of message below

## 2018-04-07 ENCOUNTER — Encounter: Payer: Self-pay | Admitting: Pediatrics

## 2018-04-07 ENCOUNTER — Ambulatory Visit: Payer: Medicare Other | Admitting: Pediatrics

## 2018-04-07 VITALS — BP 130/68 | HR 86 | Temp 98.4°F | Ht 63.0 in | Wt 217.6 lb

## 2018-04-07 DIAGNOSIS — Z9641 Presence of insulin pump (external) (internal): Secondary | ICD-10-CM

## 2018-04-07 DIAGNOSIS — E785 Hyperlipidemia, unspecified: Secondary | ICD-10-CM | POA: Diagnosis not present

## 2018-04-07 DIAGNOSIS — I10 Essential (primary) hypertension: Secondary | ICD-10-CM

## 2018-04-07 DIAGNOSIS — C481 Malignant neoplasm of specified parts of peritoneum: Secondary | ICD-10-CM | POA: Diagnosis not present

## 2018-04-07 DIAGNOSIS — E118 Type 2 diabetes mellitus with unspecified complications: Secondary | ICD-10-CM | POA: Diagnosis not present

## 2018-04-07 MED ORDER — BENAZEPRIL HCL 10 MG PO TABS: 5.0000 mg | ORAL_TABLET | Freq: Every day | ORAL | 2 refills | Status: DC

## 2018-04-07 MED ORDER — ATORVASTATIN CALCIUM 40 MG PO TABS: 40.0000 mg | ORAL_TABLET | Freq: Every day | ORAL | 3 refills | Status: DC

## 2018-04-07 NOTE — Progress Notes (Signed)
  Subjective:   Patient ID: Ellen Hunt, female    DOB: [birth year], 67 y.o.   MRN: [id number] CC: Medical Management of Chronic Issues  HPI: Ellen Hunt is a 67 y.o. female   HLD: takes statin regularly, no s/e  HTN: taking med regularly, no lightheadedness or dizziness  Has been feeling well overall. [place].   Diabetes: following with endocrine, on insulin pump. Says BGL around 150 average all day. Due for foot and eye exam.  Ovarian cancer: on chemotherapy, following with oncology  Relevant past medical, surgical, family and social history reviewed. Allergies and medications reviewed and updated. Social History   Tobacco Use  Smoking Status Never Smoker  Smokeless Tobacco Never Used   ROS: Per HPI   Objective:    BP 130/68   Pulse 86   Temp 98.4 F (36.9 C) (Oral)   Ht 5\' 3"  (1.6 m)   Wt 217 lb 9.6 oz (98.7 kg)   BMI 38.55 kg/m   Wt Readings from Last 3 Encounters:  04/07/18 217 lb 9.6 oz (98.7 kg)  04/02/18 216 lb 6.4 oz (98.2 kg)  04/02/18 216 lb 6.4 oz (98.2 kg)    Gen: NAD, alert, cooperative with exam, NCAT EYES: EOMI, no conjunctival injection, or no icterus ENT:  OP without erythema LYMPH: no cervical LAD CV: NRRR, normal S1/S2, no murmur, distal pulses 2+ b/l Resp: CTABL, no wheezes, normal WOB Abd: +BS, soft, NT Ext: trace pitting edema b/l feet and ankles, warm Neuro: Alert and oriented, strength equal b/l UE and LE, coordination grossly normal MSK: normal muscle bulk  Assessment & Plan:  Ellen Hunt was seen today for medical management of chronic issues.  Diagnoses and all orders for this visit:  Essential hypertension Adequate control, cont below -     benazepril (LOTENSIN) 10 MG tablet; Take 0.5 tablets (5 mg total) by mouth daily.  Hyperlipidemia, unspecified hyperlipidemia type Stable, cont below -     atorvastatin (LIPITOR) 40 MG tablet; Take 1 tablet (40 mg total) by mouth daily. -     Lipid  panel  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[facility]) Following with oncology, receiving chemotherapy  Type 2 diabetes mellitus with complication, with long term current use of insulin pump ([employer]) Follows with endocrine. On pump, recent AM BGLs well controlled. Foot exam today, pt aware due for eye exam.   Follow up plan: Return in about 6 months (around 10/07/2018). Assunta Found, MD [place]

## 2018-04-08 LAB — LIPID PANEL
Chol/HDL Ratio: 3.5 ratio (ref 0.0–4.4)
Cholesterol, Total: 128 mg/dL (ref 100–199)
HDL: 37 mg/dL — ABNORMAL LOW
LDL Calculated: 53 mg/dL (ref 0–99)
Triglycerides: 191 mg/dL — ABNORMAL HIGH (ref 0–149)
VLDL Cholesterol Cal: 38 mg/dL (ref 5–40)

## 2018-04-09 ENCOUNTER — Inpatient Hospital Stay: Payer: Medicare Other | Attending: Hematology and Oncology

## 2018-04-09 ENCOUNTER — Telehealth: Payer: Self-pay

## 2018-04-09 ENCOUNTER — Other Ambulatory Visit: Payer: Self-pay | Admitting: Hematology and Oncology

## 2018-04-09 DIAGNOSIS — R531 Weakness: Secondary | ICD-10-CM | POA: Diagnosis not present

## 2018-04-09 DIAGNOSIS — E1122 Type 2 diabetes mellitus with diabetic chronic kidney disease: Secondary | ICD-10-CM | POA: Diagnosis not present

## 2018-04-09 DIAGNOSIS — Z90710 Acquired absence of both cervix and uterus: Secondary | ICD-10-CM | POA: Diagnosis not present

## 2018-04-09 DIAGNOSIS — T451X5A Adverse effect of antineoplastic and immunosuppressive drugs, initial encounter: Secondary | ICD-10-CM

## 2018-04-09 DIAGNOSIS — Z794 Long term (current) use of insulin: Secondary | ICD-10-CM | POA: Diagnosis not present

## 2018-04-09 DIAGNOSIS — Z90722 Acquired absence of ovaries, bilateral: Secondary | ICD-10-CM | POA: Diagnosis not present

## 2018-04-09 DIAGNOSIS — C786 Secondary malignant neoplasm of retroperitoneum and peritoneum: Secondary | ICD-10-CM | POA: Insufficient documentation

## 2018-04-09 DIAGNOSIS — Z79899 Other long term (current) drug therapy: Secondary | ICD-10-CM | POA: Insufficient documentation

## 2018-04-09 DIAGNOSIS — E1140 Type 2 diabetes mellitus with diabetic neuropathy, unspecified: Secondary | ICD-10-CM | POA: Insufficient documentation

## 2018-04-09 DIAGNOSIS — I2510 Atherosclerotic heart disease of native coronary artery without angina pectoris: Secondary | ICD-10-CM | POA: Diagnosis not present

## 2018-04-09 DIAGNOSIS — D61810 Antineoplastic chemotherapy induced pancytopenia: Secondary | ICD-10-CM | POA: Diagnosis not present

## 2018-04-09 DIAGNOSIS — C578 Malignant neoplasm of overlapping sites of female genital organs: Secondary | ICD-10-CM | POA: Insufficient documentation

## 2018-04-09 DIAGNOSIS — C481 Malignant neoplasm of specified parts of peritoneum: Secondary | ICD-10-CM

## 2018-04-09 DIAGNOSIS — Z5111 Encounter for antineoplastic chemotherapy: Secondary | ICD-10-CM | POA: Diagnosis not present

## 2018-04-09 DIAGNOSIS — N183 Chronic kidney disease, stage 3 (moderate): Secondary | ICD-10-CM | POA: Insufficient documentation

## 2018-04-09 DIAGNOSIS — G620 Drug-induced polyneuropathy: Secondary | ICD-10-CM | POA: Diagnosis not present

## 2018-04-09 DIAGNOSIS — T451X5S Adverse effect of antineoplastic and immunosuppressive drugs, sequela: Secondary | ICD-10-CM | POA: Diagnosis not present

## 2018-04-09 DIAGNOSIS — D6481 Anemia due to antineoplastic chemotherapy: Secondary | ICD-10-CM

## 2018-04-09 LAB — CBC WITH DIFFERENTIAL/PLATELET
BASOS ABS: 0 10*3/uL (ref 0.0–0.1)
BASOS PCT: 0 %
EOS ABS: 0.4 10*3/uL (ref 0.0–0.5)
Eosinophils Relative: 8 %
HCT: 26.8 % — ABNORMAL LOW (ref 34.8–46.6)
HEMOGLOBIN: 9.1 g/dL — AB (ref 11.6–15.9)
Lymphocytes Relative: 9 %
Lymphs Abs: 0.4 10*3/uL — ABNORMAL LOW (ref 0.9–3.3)
MCH: 36.7 pg — ABNORMAL HIGH (ref 25.1–34.0)
MCHC: 34 g/dL (ref 31.5–36.0)
MCV: 108.1 fL — ABNORMAL HIGH (ref 79.5–101.0)
MONO ABS: 0.7 10*3/uL (ref 0.1–0.9)
MONOS PCT: 15 %
NEUTROS PCT: 68 %
Neutro Abs: 3.1 10*3/uL (ref 1.5–6.5)
Platelets: 159 10*3/uL (ref 145–400)
RBC: 2.48 MIL/uL — ABNORMAL LOW (ref 3.70–5.45)
RDW: 20.5 % — AB (ref 11.2–14.5)
WBC: 4.5 10*3/uL (ref 3.9–10.3)

## 2018-04-09 LAB — MAGNESIUM: MAGNESIUM: 1.8 mg/dL (ref 1.7–2.4)

## 2018-04-09 LAB — COMPREHENSIVE METABOLIC PANEL
ALBUMIN: 4.1 g/dL (ref 3.5–5.0)
ALT: 15 U/L (ref 0–55)
ANION GAP: 13 — AB (ref 3–11)
AST: 17 U/L (ref 5–34)
Alkaline Phosphatase: 126 U/L (ref 40–150)
BUN: 33 mg/dL — ABNORMAL HIGH (ref 7–26)
CO2: 25 mmol/L (ref 22–29)
Calcium: 9.8 mg/dL (ref 8.4–10.4)
Chloride: 102 mmol/L (ref 98–109)
Creatinine, Ser: 1.17 mg/dL — ABNORMAL HIGH (ref 0.60–1.10)
GFR calc Af Amer: 56 mL/min — ABNORMAL LOW (ref >60)
GFR calc non Af Amer: 48 mL/min — ABNORMAL LOW (ref >60)
GLUCOSE: 107 mg/dL (ref 70–140)
POTASSIUM: 4.5 mmol/L (ref 3.5–5.1)
SODIUM: 140 mmol/L (ref 136–145)
Total Bilirubin: 0.2 mg/dL (ref 0.2–1.2)
Total Protein: 7 g/dL (ref 6.4–8.3)

## 2018-04-09 LAB — SAMPLE TO BLOOD BANK

## 2018-04-09 NOTE — Telephone Encounter (Signed)
Called with below message. She verbalized understanding. 

## 2018-04-09 NOTE — Telephone Encounter (Signed)
-----   Message from Heath Lark, MD sent at 04/09/2018  9:15 AM EDT ----- Regarding: labs are good Can you call and let her know labs are good OK to proceed with treatment next week ----- Message ----- From: Interface, Lab In [place] Sent: 04/09/2018   8:24 AM To: Heath Lark, MD

## 2018-04-12 ENCOUNTER — Inpatient Hospital Stay: Payer: Medicare Other

## 2018-04-12 ENCOUNTER — Other Ambulatory Visit: Payer: BC Managed Care – PPO

## 2018-04-12 VITALS — BP 118/56 | HR 87 | Temp 98.4°F | Resp 18

## 2018-04-12 DIAGNOSIS — C578 Malignant neoplasm of overlapping sites of female genital organs: Secondary | ICD-10-CM | POA: Diagnosis not present

## 2018-04-12 DIAGNOSIS — C481 Malignant neoplasm of specified parts of peritoneum: Secondary | ICD-10-CM

## 2018-04-12 MED ORDER — SODIUM CHLORIDE 0.9 % IV SOLN
Freq: Once | INTRAVENOUS | Status: AC
  Administered 2018-04-12: 08:00:00 via INTRAVENOUS

## 2018-04-12 MED ORDER — PALONOSETRON HCL INJECTION 0.25 MG/5ML
0.2500 mg | Freq: Once | INTRAVENOUS | Status: AC
  Administered 2018-04-12: 0.25 mg via INTRAVENOUS

## 2018-04-12 MED ORDER — PALONOSETRON HCL INJECTION 0.25 MG/5ML
INTRAVENOUS | Status: AC
  Filled 2018-04-12: qty 5

## 2018-04-12 MED ORDER — PEGFILGRASTIM 6 MG/0.6ML ~~LOC~~ PSKT
6.0000 mg | PREFILLED_SYRINGE | Freq: Once | SUBCUTANEOUS | Status: AC
  Administered 2018-04-12: 6 mg via SUBCUTANEOUS

## 2018-04-12 MED ORDER — SODIUM CHLORIDE 0.9 % IV SOLN
50.0000 mg | Freq: Once | INTRAVENOUS | Status: AC
  Administered 2018-04-12: 50 mg via INTRAVENOUS
  Filled 2018-04-12: qty 50

## 2018-04-12 MED ORDER — PEGFILGRASTIM 6 MG/0.6ML ~~LOC~~ PSKT
PREFILLED_SYRINGE | SUBCUTANEOUS | Status: AC
  Filled 2018-04-12: qty 0.6

## 2018-04-12 MED ORDER — SODIUM CHLORIDE 0.9% FLUSH
10.0000 mL | INTRAVENOUS | Status: DC | PRN
  Administered 2018-04-12: 10 mL
  Filled 2018-04-12: qty 10

## 2018-04-12 MED ORDER — HEPARIN SOD (PORK) LOCK FLUSH 100 UNIT/ML IV SOLN
500.0000 [IU] | Freq: Once | INTRAVENOUS | Status: AC | PRN
  Administered 2018-04-12: 500 [IU]
  Filled 2018-04-12: qty 5

## 2018-04-12 MED ORDER — POTASSIUM CHLORIDE 2 MEQ/ML IV SOLN
Freq: Once | INTRAVENOUS | Status: AC
  Administered 2018-04-12: 09:00:00 via INTRAVENOUS
  Filled 2018-04-12: qty 10

## 2018-04-12 MED ORDER — SODIUM CHLORIDE 0.9 % IV SOLN
Freq: Once | INTRAVENOUS | Status: AC
  Administered 2018-04-12: 11:00:00 via INTRAVENOUS
  Filled 2018-04-12: qty 5

## 2018-04-12 MED ORDER — SODIUM CHLORIDE 0.9 % IV SOLN
500.0000 mg/m2 | Freq: Once | INTRAVENOUS | Status: AC
  Administered 2018-04-12: 1026 mg via INTRAVENOUS
  Filled 2018-04-12: qty 26.98

## 2018-04-12 NOTE — Patient Instructions (Signed)
[facility] Discharge Instructions for Patients Receiving Chemotherapy  Today you received the following chemotherapy agents: cisplatin (Platinol) and gemcitabine (Gemzar)  To help prevent nausea and vomiting after your treatment, we encourage you to take your nausea medication as prescribed.   If you develop nausea and vomiting that is not controlled by your nausea medication, call the clinic.   BELOW ARE SYMPTOMS THAT SHOULD BE REPORTED IMMEDIATELY:  *FEVER GREATER THAN 100.5 F  *CHILLS WITH OR WITHOUT FEVER  NAUSEA AND VOMITING THAT IS NOT CONTROLLED WITH YOUR NAUSEA MEDICATION  *UNUSUAL SHORTNESS OF BREATH  *UNUSUAL BRUISING OR BLEEDING  TENDERNESS IN MOUTH AND THROAT WITH OR WITHOUT PRESENCE OF ULCERS  *URINARY PROBLEMS  *BOWEL PROBLEMS  UNUSUAL RASH Items with * indicate a potential emergency and should be followed up as soon as possible.  Feel free to call the clinic should you have any questions or concerns. The clinic phone number is [phone].  Please show the [facility] at check-in to the Emergency Department and triage nurse.

## 2018-04-17 ENCOUNTER — Other Ambulatory Visit: Payer: Self-pay | Admitting: Hematology and Oncology

## 2018-04-17 DIAGNOSIS — C569 Malignant neoplasm of unspecified ovary: Secondary | ICD-10-CM

## 2018-04-29 ENCOUNTER — Telehealth: Payer: Self-pay

## 2018-04-29 ENCOUNTER — Inpatient Hospital Stay (HOSPITAL_BASED_OUTPATIENT_CLINIC_OR_DEPARTMENT_OTHER): Payer: Medicare Other | Admitting: Hematology and Oncology

## 2018-04-29 ENCOUNTER — Encounter: Payer: Self-pay | Admitting: Hematology and Oncology

## 2018-04-29 ENCOUNTER — Telehealth: Payer: Self-pay | Admitting: Hematology and Oncology

## 2018-04-29 ENCOUNTER — Inpatient Hospital Stay: Payer: Medicare Other

## 2018-04-29 DIAGNOSIS — C569 Malignant neoplasm of unspecified ovary: Secondary | ICD-10-CM

## 2018-04-29 DIAGNOSIS — Z79899 Other long term (current) drug therapy: Secondary | ICD-10-CM

## 2018-04-29 DIAGNOSIS — E1140 Type 2 diabetes mellitus with diabetic neuropathy, unspecified: Secondary | ICD-10-CM | POA: Diagnosis not present

## 2018-04-29 DIAGNOSIS — R531 Weakness: Secondary | ICD-10-CM

## 2018-04-29 DIAGNOSIS — T451X5A Adverse effect of antineoplastic and immunosuppressive drugs, initial encounter: Secondary | ICD-10-CM

## 2018-04-29 DIAGNOSIS — C481 Malignant neoplasm of specified parts of peritoneum: Secondary | ICD-10-CM

## 2018-04-29 DIAGNOSIS — C786 Secondary malignant neoplasm of retroperitoneum and peritoneum: Secondary | ICD-10-CM

## 2018-04-29 DIAGNOSIS — Z9641 Presence of insulin pump (external) (internal): Secondary | ICD-10-CM

## 2018-04-29 DIAGNOSIS — D6481 Anemia due to antineoplastic chemotherapy: Secondary | ICD-10-CM

## 2018-04-29 DIAGNOSIS — G620 Drug-induced polyneuropathy: Secondary | ICD-10-CM

## 2018-04-29 DIAGNOSIS — T451X5S Adverse effect of antineoplastic and immunosuppressive drugs, sequela: Secondary | ICD-10-CM

## 2018-04-29 DIAGNOSIS — D61810 Antineoplastic chemotherapy induced pancytopenia: Secondary | ICD-10-CM

## 2018-04-29 DIAGNOSIS — N183 Chronic kidney disease, stage 3 unspecified: Secondary | ICD-10-CM

## 2018-04-29 DIAGNOSIS — E118 Type 2 diabetes mellitus with unspecified complications: Secondary | ICD-10-CM

## 2018-04-29 DIAGNOSIS — I2510 Atherosclerotic heart disease of native coronary artery without angina pectoris: Secondary | ICD-10-CM

## 2018-04-29 DIAGNOSIS — C578 Malignant neoplasm of overlapping sites of female genital organs: Secondary | ICD-10-CM | POA: Diagnosis not present

## 2018-04-29 DIAGNOSIS — E1122 Type 2 diabetes mellitus with diabetic chronic kidney disease: Secondary | ICD-10-CM

## 2018-04-29 DIAGNOSIS — Z90722 Acquired absence of ovaries, bilateral: Secondary | ICD-10-CM | POA: Diagnosis not present

## 2018-04-29 DIAGNOSIS — Z794 Long term (current) use of insulin: Secondary | ICD-10-CM

## 2018-04-29 DIAGNOSIS — D61818 Other pancytopenia: Secondary | ICD-10-CM

## 2018-04-29 DIAGNOSIS — Z90710 Acquired absence of both cervix and uterus: Secondary | ICD-10-CM | POA: Diagnosis not present

## 2018-04-29 LAB — CBC WITH DIFFERENTIAL/PLATELET
BASOS ABS: 0 10*3/uL (ref 0.0–0.1)
Basophils Relative: 0 %
EOS ABS: 0.4 10*3/uL (ref 0.0–0.5)
EOS PCT: 5 %
HCT: 29.4 % — ABNORMAL LOW (ref 34.8–46.6)
HEMOGLOBIN: 9.4 g/dL — AB (ref 11.6–15.9)
Lymphocytes Relative: 6 %
Lymphs Abs: 0.5 10*3/uL — ABNORMAL LOW (ref 0.9–3.3)
MCH: 36.2 pg — ABNORMAL HIGH (ref 25.1–34.0)
MCHC: 32 g/dL (ref 31.5–36.0)
MCV: 113.1 fL — ABNORMAL HIGH (ref 79.5–101.0)
Monocytes Absolute: 0.8 10*3/uL (ref 0.1–0.9)
Monocytes Relative: 12 %
NEUTROS PCT: 77 %
Neutro Abs: 5.4 10*3/uL (ref 1.5–6.5)
PLATELETS: 144 10*3/uL — AB (ref 145–400)
RBC: 2.6 MIL/uL — AB (ref 3.70–5.45)
RDW: 17.5 % — ABNORMAL HIGH (ref 11.2–14.5)
WBC: 7 10*3/uL (ref 3.9–10.3)

## 2018-04-29 LAB — COMPREHENSIVE METABOLIC PANEL
ALBUMIN: 3.9 g/dL (ref 3.5–5.0)
ALT: 18 U/L (ref 0–44)
ANION GAP: 8 (ref 5–15)
AST: 15 U/L (ref 15–41)
Alkaline Phosphatase: 142 U/L — ABNORMAL HIGH (ref 38–126)
BUN: 30 mg/dL — ABNORMAL HIGH (ref 8–23)
CALCIUM: 9.7 mg/dL (ref 8.9–10.3)
CHLORIDE: 105 mmol/L (ref 98–111)
CO2: 29 mmol/L (ref 22–32)
Creatinine, Ser: 1.33 mg/dL — ABNORMAL HIGH (ref 0.44–1.00)
GFR calc non Af Amer: 41 mL/min — ABNORMAL LOW (ref >60)
GFR, EST AFRICAN AMERICAN: 47 mL/min — AB (ref >60)
GLUCOSE: 140 mg/dL — AB (ref 70–99)
POTASSIUM: 4.5 mmol/L (ref 3.5–5.1)
SODIUM: 142 mmol/L (ref 135–145)
Total Bilirubin: 0.2 mg/dL — ABNORMAL LOW (ref 0.3–1.2)
Total Protein: 6.7 g/dL (ref 6.5–8.1)

## 2018-04-29 LAB — SAMPLE TO BLOOD BANK

## 2018-04-29 LAB — MAGNESIUM: MAGNESIUM: 1.4 mg/dL — AB (ref 1.7–2.4)

## 2018-04-29 MED ORDER — HYDROCODONE-ACETAMINOPHEN 10-325 MG PO TABS: 1.0000 | ORAL_TABLET | ORAL | 0 refills | Status: DC | PRN

## 2018-04-29 NOTE — Assessment & Plan Note (Signed)
I have reviewed recent tumor marker and CT scan with the patient and family Overall, she is responding well to treatment since addition of cisplatin, although that was causing some issues with low blood count requiring transfusion. We will continue modified dosing and I plan to modify her schedule further to get chemo every 3 weeks to allow blood count recovery She will continue G-CSF support after treatment Since we have modify her dosing, she has been transfusion independent and has no side effects from  GCSF support or risk of infection She does not need transfusion today I will cancel her treatment next week due to pancy If her tumor marker continues to respond to treatment, I do not plan to repeat imaging study until September of this year

## 2018-04-29 NOTE — Telephone Encounter (Signed)
Called with Magnesium results of 1.4, instructed to continue Magnesium Rx. She verbalized understanding.

## 2018-04-29 NOTE — Assessment & Plan Note (Signed)
This is due to cisplatin side effects She will continue magnesium replacement therapy

## 2018-04-29 NOTE — Telephone Encounter (Signed)
Gave patient avs and calendar of upcoming July and august appointments patient scheduled in July per avail in treatment area.

## 2018-04-29 NOTE — Progress Notes (Signed)
[place] OFFICE PROGRESS NOTE  Patient Care Team: Eustaquio Maize, MD as PCP - General (Pediatrics)  ASSESSMENT & PLAN:  Extraovarian primary peritoneal carcinoma [facility]) I have reviewed recent tumor marker and CT scan with the patient and family Overall, she is responding well to treatment since addition of cisplatin, although that was causing some issues with low blood count requiring transfusion. We will continue modified dosing and I plan to modify her schedule further to get chemo every 3 weeks to allow blood count recovery She will continue G-CSF support after treatment Since we have modify her dosing, she has been transfusion independent and has no side effects from  GCSF support or risk of infection She does not need transfusion today I will cancel her treatment next week due to pancy If her tumor marker continues to respond to treatment, I do not plan to repeat imaging study until September of this year  Pancytopenia, acquired [facility]) She has persistent pancytopenia despite multiple dosage adjustment She is doing better when the treatment interval is lengthened to every 3 weeks She agree with the plan of care I recommend she continues on G-CSF support after each cycle She does not need transfusion today  Type 2 diabetes mellitus with complication, with long term current use of insulin pump ([place]) She has recent fluctuation of blood sugar, likely precipitated by side effects of chemotherapy She follows closely with endocrinologist for medical management We discussed dietary modification  Chemotherapy-induced neuropathy ([place]) 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 Continue to monitor closely This is stable since recent dose adjustment She will continue pain medicine as prescribed She is having difficulties mobilizing because of this I gave her disability parking permit to try for 6 months If she remain permanently disabled, we  will certify her permanently disabled in the future  CKD (chronic kidney disease), stage III ([place]) She has chronic kidney disease stage III, stable She will continue risk factor modification  Hypomagnesemia This is due to cisplatin side effects She will continue magnesium replacement therapy   No orders of the defined types were placed in this encounter.   INTERVAL HISTORY: Please see below for problem oriented charting. She returns with her husband for further follow-up, to be seen prior to next dose of chemotherapy next week Since we lengthen the interval between treatment, she tolerated treatment better She complained of weakness and difficulties walking around secondary to pain and neuropathy affecting her feet Her diabetes is well controlled with current prescription insulin by her endocrinologist The patient denies any recent signs or symptoms of bleeding such as spontaneous epistaxis, hematuria or hematochezia. She denies nausea, changes of bowel habits or abdominal bloating She denies recent infection, fever or chills No recent cough  SUMMARY OF ONCOLOGIC HISTORY: Oncology History   Negative genetic testing ER PR positive, Her 2 neu negative     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[place])   04/29/2016 Imaging    CT abd/pelvis- Extensive omental caking as well as moderate amount of ascites within the abdomen most compatible with peritoneal metastatic disease, of unknown primary. This may potentially be ovarian or a GI in etiology.      04/30/2016 Tumor Marker    CA 125- 7149.0 (H)      05/01/2016 Procedure    US paracentesis- Successful ultrasound-guided paracentesis yielding 1.8 liters of peritoneal fluid.      05/01/2016 Imaging    US pelvis- Both transabdominal and transvaginal sonography are significantly limited by large  patient habitus and ascites. Neither uterus or ovaries were visualized on this exam.      05/02/2016 Pathology Results     PERITONEAL/ASCITIC FLUID(SPECIMEN 1 OF 1 COLLECTED 05/01/16): MALIGNANT CELLS CONSISTENT WITH METASTATIC HIGH GRADE SEROUS CARCINOMA.      05/08/2016 Imaging    CT chest- No evidence of metastatic disease in the chest. Peritoneal/omental disease with abdominal ascites in the upper abdomen, incompletely visualized.       05/13/2016 Procedure    Placement of single lumen port a cath via right internal jugular vein. The catheter tip lies at the cavoatrial junction. A power injectable port a cath was placed and is ready for immediate use.      05/15/2016 Procedure    US Paracentesis- 3400 ml yellow colored ascites removed      05/15/2016 - 09/18/2016 Chemotherapy    Carboplatin/Paclitaxel every 21 days x 7 cycles      07/01/2016 Miscellaneous    Genetic Counseling by [name]-  Genetic testing was normal, and did not reveal a deleterious mutation in these genes.       07/08/2016 Imaging    CT CAP- 1. Small volume ascites, significantly decreased. 2. Stable diffuse omental soft tissue caking and diffuse peritoneal thickening along the bilateral paracolic gutters and bilateral pelvic peritoneal reflections, consistent with peritoneal carcinomatosis. 3. Stable asymmetrically enlarged right ovary, which may represent the primary site of ovarian malignancy. 4. No evidence of metastatic disease in the chest. No new sites of metastatic disease in the abdomen or pelvis.      07/09/2016 Miscellaneous    Gyn Onc re-evaluation- modest response to therapy, 3 more cycles of chemotherapy recommended.        09/11/2016 Imaging    CT C/A/P No significant change omental soft tissue caking, consistent with metastatic disease. Mild ascites is decreased since previous study.  Increased calcification along peritoneal surface in pelvic cul-de-sac, consistent with treated peritoneal metastatic disease.  Stable 4.5cm homogeneous right pelvic mass, which favors a uterine fibroid although right ovarian  neoplasm cannot definitely be excluded.  No new or progressive metastatic disease identified. No evidence of metastatic disease within the thorax.       10/14/2016 Procedure    Robotic-assisted laparoscopic total hysterectomy with bilateral salpingoophorectomy, ex lap omentectomy, radical tumor debulking by Dr. Denman [place]      10/17/2016 Pathology Results    Diagnosis 1. 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 - CERVIX AND ENDOMETRIUM FREE OF TUMOR. - SEE ONCOLOGY TABLE AND COMMENT. 2. Soft tissue, biopsy, umbilical nodule - HIGH GRADE SEROUS CARCINOMA. 3. Omentum, resection for tumor - HIGH GRADE SEROUS CARCINOMA, 33 CM.      11/06/2016 - 12/25/2016 Chemotherapy    Carboplatin/Paclitaxel x 3 cycles       01/12/2017 Imaging    CT CAP- 1. Interval hysterectomy, bilateral salpingo-oophorectomy and omentectomy without evidence of tumor recurrence. 2. No evidence of metastatic disease. 3. 5 mm nonobstructing lower pole left renal calculus.      01/13/2017 Remission    No evidence of residual disease on CT imaging.      05/04/2017 Imaging    CT CAP- New small amount of ascites within the abdomen and pelvis since 01/12/2017 which could indicate disease progression but no new identifiable tumor and no significant change in omental and mild peritoneal thickening.  No evidence of metastatic disease within the chest.  Coronary artery disease.  Aortic Atherosclerosis (ICD10-I70.0).  05/04/2017 Tumor Marker    Patient's tumor was tested for the following markers: CA 125 Results of the tumor marker test revealed 1633      05/18/2017 Imaging    ECHO; EF 60% -  65      05/25/2017 - 07/07/2017 Chemotherapy    She received Doxil and Avastin. Treatment is stopped due to disease progression      06/01/2017 Procedure    Successful ultrasound-guided therapeutic paracentesis  yielding 2.8 liters of peritoneal fluid      06/22/2017 Tumor Marker    Patient's tumor was tested for the following markers: CA 125 Results of the tumor marker test revealed 13440      07/20/2017 Imaging    Mild increase in peritoneal carcinoma within abdomen pelvis since previous study. No significant change and minimal ascites.  No evidence of metastatic disease within the thorax. New mild airspace opacity in left lower lobe, consistent with inflammatory or infectious etiology.      07/30/2017 Tumor Marker    Patient's tumor was tested for the following markers: CA 125 Results of the tumor marker test revealed 12099      07/30/2017 -  Chemotherapy    The patient had gemzar. Cisplatin is added on 11/02/18      08/03/2017 - 08/05/2017 Hospital Admission    She was admitted to the hospital for management of UTI and neutropenic fever      08/13/2017 Adverse Reaction    Dose of chemotherapy is reduced due to neutropenic sepsis      08/24/2017 Tumor Marker    Patient's tumor was tested for the following markers: CA 125 Results of the tumor marker test revealed 7431      09/21/2017 Tumor Marker    Patient's tumor was tested for the following markers: CA 125 Results of the tumor marker test revealed 4176      10/09/2017 Imaging    No significant change in peritoneal carcinomatosis since previous study.  No new or progressive metastatic disease within the abdomen or pelvis.  Stable tiny nonobstructive left renal calculus. No evidence of ureteral calculi or hydronephrosis.      11/10/2017 Tumor Marker    Patient's tumor was tested for the following markers: CA 125 Results of the tumor marker test revealed 4257      11/24/2017 Tumor Marker    Patient's tumor was tested for the following markers: CA 125 Results of the tumor marker test revealed 3377      01/12/2018 Tumor Marker    Patient's tumor was tested for the following markers: CA 125 Results of the tumor marker test  revealed 1595      01/21/2018 Imaging    1. Stable to slightly improved omental, gastrohepatic ligament and pelvic disease as detailed above. No new/progressive findings. 2. No acute abdominal or pelvic findings. 3. Stable lower pole left renal calculus.      02/08/2018 Tumor Marker    Patient's tumor was tested for the following markers: CA 125 Results of the tumor marker test revealed 1058      03/08/2018 Tumor Marker    Patient's tumor was tested for the following markers: CA-125 Results of the tumor marker test revealed 737.1      04/02/2018 Tumor Marker    Patient's tumor was tested for the following markers: CA-125 Results of the tumor marker test revealed 542.8       REVIEW OF SYSTEMS:   Constitutional: Denies fevers, chills or abnormal weight loss Eyes: Denies blurriness of  vision Ears, nose, mouth, throat, and face: Denies mucositis or sore throat Respiratory: Denies cough, dyspnea or wheezes Cardiovascular: Denies palpitation, chest discomfort or lower extremity swelling Gastrointestinal:  Denies nausea, heartburn or change in bowel habits Skin: Denies abnormal skin rashes Lymphatics: Denies new lymphadenopathy or easy bruising Behavioral/Psych: Mood is stable, no new changes  All other systems were reviewed with the patient and are negative.  I have reviewed the past medical history, past surgical history, social history and family history with the patient and they are unchanged from previous note.  ALLERGIES:  is allergic to avandia [rosiglitazone]; micronase [glyburide]; and actos [pioglitazone].  MEDICATIONS:  Current Outpatient Medications  Medication Sig Dispense Refill  . atorvastatin (LIPITOR) 40 MG tablet Take 1 tablet (40 mg total) by mouth daily. 90 tablet 3  . benazepril (LOTENSIN) 10 MG tablet Take 0.5 tablets (5 mg total) by mouth daily. 45 tablet 2  . Biotin 1000 MCG tablet Take 1,000 mcg by mouth 3 (three) times daily.    . furosemide (LASIX) 20 MG  tablet TAKE 1 TABLET(20 MG) BY MOUTH THREE TIMES DAILY 90 tablet 0  . gabapentin (NEURONTIN) 300 MG capsule Take 1 capsule (300 mg total) by mouth 3 (three) times daily. 90 capsule 11  . glucose blood (ONE TOUCH ULTRA TEST) test strip USE TO CHECK BLOOD SUGAR UP TO 5 TIMES A DAY 500 each 3  . HYDROcodone-acetaminophen (NORCO) 10-325 MG tablet Take 1 tablet by mouth every 4 (four) hours as needed. 90 tablet 0  . insulin aspart (NOVOLOG) 100 UNIT/ML injection USE IN INSULIN PUMP AS DIRECTED. MAX DAILY DOSE OF 110 UNITS PER DAY 110 mL 2  . lidocaine-prilocaine (EMLA) cream Apply a quarter size amount to port site 1 hour prior to chemo. Do not rub in. Cover with plastic wrap. 30 g 3  . loperamide (IMODIUM A-D) 2 MG tablet Take 1 tablet (2 mg total) by mouth 4 (four) times daily as needed for diarrhea or loose stools. 30 tablet 0  . loratadine (CLARITIN) 10 MG tablet Take 10 mg by mouth daily.    . magnesium oxide (MAG-OX) 400 (241.3 Mg) MG tablet Take 1 tablet (400 mg total) by mouth 2 (two) times daily. (Patient taking differently: Take 400 mg by mouth 3 (three) times daily. ) 60 tablet 0  . metFORMIN (GLUCOPHAGE) 1000 MG tablet TAKE 1 TABLET BY MOUTH TWICE DAILY WITH A MEAL 180 tablet 3  . Multiple Vitamin (MULTIVITAMIN WITH MINERALS) TABS Take 1 tablet by mouth daily.    . multivitamin-lutein (OCUVITE-LUTEIN) CAPS capsule Take 1 capsule by mouth daily.    Marland Kitchen omeprazole (PRILOSEC) 40 MG capsule Take 1 capsule (40 mg total) daily by mouth. 90 capsule 3  . ondansetron (ZOFRAN) 8 MG tablet Take 1 tablet (8 mg total) by mouth every 8 (eight) hours as needed for nausea or vomiting. 90 tablet 3  . polyethylene glycol (MIRALAX / GLYCOLAX) packet Take 17 g by mouth daily as needed for mild constipation.     . prochlorperazine (COMPAZINE) 10 MG tablet Take 1 tablet (10 mg total) by mouth every 6 (six) hours as needed for nausea or vomiting. 30 tablet 0  . senna (SENOKOT) 8.6 MG TABS tablet Take 1 tablet (8.6 mg  total) by mouth at bedtime as needed for moderate constipation. 120 each 0  . vitamin B-12 (CYANOCOBALAMIN) 1000 MCG tablet Take 1,000 mcg by mouth daily.    . vitamin E (VITAMIN E) 400 UNIT capsule Take 400 Units by  mouth daily.     No current facility-administered medications for this visit.     PHYSICAL EXAMINATION: ECOG PERFORMANCE STATUS: 1 - Symptomatic but completely ambulatory  Vitals:   04/29/18 1020  BP: (!) 127/59  Pulse: 94  Resp: 18  Temp: 98.5 F (36.9 C)  SpO2: 96%   Filed Weights   04/29/18 1020  Weight: 218 lb 3.2 oz (99 kg)    GENERAL:alert, no distress and comfortable SKIN: skin color, texture, turgor are normal, no rashes or significant lesions EYES: normal, Conjunctiva are pink and non-injected, sclera clear OROPHARYNX:no exudate, no erythema and lips, buccal mucosa, and tongue normal  NECK: supple, thyroid normal size, non-tender, without nodularity LYMPH:  no palpable lymphadenopathy in the cervical, axillary or inguinal LUNGS: clear to auscultation and percussion with normal breathing effort HEART: regular rate & rhythm and no murmurs and no lower extremity edema ABDOMEN:abdomen soft, non-tender and normal bowel sounds Musculoskeletal:no cyanosis of digits and no clubbing  NEURO: alert & oriented x 3 with fluent speech, no focal motor/sensory deficits  LABORATORY DATA:  I have reviewed the data as listed    Component Value Date/Time   NA 142 04/29/2018 0956   NA 139 11/02/2017 0756   K 4.5 04/29/2018 0956   K 4.7 11/02/2017 0756   CL 105 04/29/2018 0956   CO2 29 04/29/2018 0956   CO2 26 11/02/2017 0756   GLUCOSE 140 (H) 04/29/2018 0956   GLUCOSE 140 11/02/2017 0756   BUN 30 (H) 04/29/2018 0956   BUN 23.7 11/02/2017 0756   CREATININE 1.33 (H) 04/29/2018 0956   CREATININE 0.9 11/02/2017 0756   CALCIUM 9.7 04/29/2018 0956   CALCIUM 9.4 11/02/2017 0756   PROT 6.7 04/29/2018 0956   PROT 7.0 11/02/2017 0756   ALBUMIN 3.9 04/29/2018 0956    ALBUMIN 3.5 11/02/2017 0756   AST 15 04/29/2018 0956   AST 18 11/02/2017 0756   ALT 18 04/29/2018 0956   ALT 23 11/02/2017 0756   ALKPHOS 142 (H) 04/29/2018 0956   ALKPHOS 108 11/02/2017 0756   BILITOT 0.2 (L) 04/29/2018 0956   BILITOT 0.24 11/02/2017 0756   GFRNONAA 41 (L) 04/29/2018 0956   GFRAA 47 (L) 04/29/2018 0956    No results found for: SPEP, UPEP  Lab Results  Component Value Date   WBC 7.0 04/29/2018   NEUTROABS 5.4 04/29/2018   HGB 9.4 (L) 04/29/2018   HCT 29.4 (L) 04/29/2018   MCV 113.1 (H) 04/29/2018   PLT 144 (L) 04/29/2018      Chemistry      Component Value Date/Time   NA 142 04/29/2018 0956   NA 139 11/02/2017 0756   K 4.5 04/29/2018 0956   K 4.7 11/02/2017 0756   CL 105 04/29/2018 0956   CO2 29 04/29/2018 0956   CO2 26 11/02/2017 0756   BUN 30 (H) 04/29/2018 0956   BUN 23.7 11/02/2017 0756   CREATININE 1.33 (H) 04/29/2018 0956   CREATININE 0.9 11/02/2017 0756      Component Value Date/Time   CALCIUM 9.7 04/29/2018 0956   CALCIUM 9.4 11/02/2017 0756   ALKPHOS 142 (H) 04/29/2018 0956   ALKPHOS 108 11/02/2017 0756   AST 15 04/29/2018 0956   AST 18 11/02/2017 0756   ALT 18 04/29/2018 0956   ALT 23 11/02/2017 0756   BILITOT 0.2 (L) 04/29/2018 0956   BILITOT 0.24 11/02/2017 0756      All questions were answered. The patient knows to call the clinic with any problems,  questions or concerns. No barriers to learning was detected.  I spent 25 minutes counseling the patient face to face. The total time spent in the appointment was 30 minutes and more than 50% was on counseling and review of test results  Heath Lark, MD 04/29/2018 11:24 AM

## 2018-04-29 NOTE — Assessment & Plan Note (Signed)
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

## 2018-04-29 NOTE — Assessment & Plan Note (Signed)
She has persistent pancytopenia despite multiple dosage adjustment She is doing better when the treatment interval is lengthened to every 3 weeks She agree with the plan of care I recommend she continues on G-CSF support after each cycle She does not need transfusion today

## 2018-04-29 NOTE — Assessment & Plan Note (Signed)
She has recent fluctuation of blood sugar, likely precipitated by side effects of chemotherapy She follows closely with endocrinologist for medical management We discussed dietary modification

## 2018-04-29 NOTE — Assessment & Plan Note (Signed)
She has chronic kidney disease stage III, stable She will continue risk factor modification

## 2018-04-30 LAB — CA 125: CANCER ANTIGEN (CA) 125: 538.9 U/mL — AB (ref 0.0–38.1)

## 2018-05-03 ENCOUNTER — Encounter: Payer: Self-pay | Admitting: Hematology and Oncology

## 2018-05-03 ENCOUNTER — Inpatient Hospital Stay: Payer: Medicare Other | Attending: Hematology and Oncology

## 2018-05-03 VITALS — BP 125/58 | HR 75 | Temp 98.2°F | Resp 17

## 2018-05-03 DIAGNOSIS — G620 Drug-induced polyneuropathy: Secondary | ICD-10-CM | POA: Diagnosis not present

## 2018-05-03 DIAGNOSIS — Z90722 Acquired absence of ovaries, bilateral: Secondary | ICD-10-CM | POA: Insufficient documentation

## 2018-05-03 DIAGNOSIS — E1121 Type 2 diabetes mellitus with diabetic nephropathy: Secondary | ICD-10-CM | POA: Insufficient documentation

## 2018-05-03 DIAGNOSIS — C578 Malignant neoplasm of overlapping sites of female genital organs: Secondary | ICD-10-CM | POA: Diagnosis present

## 2018-05-03 DIAGNOSIS — Z794 Long term (current) use of insulin: Secondary | ICD-10-CM | POA: Diagnosis not present

## 2018-05-03 DIAGNOSIS — Z79899 Other long term (current) drug therapy: Secondary | ICD-10-CM | POA: Diagnosis not present

## 2018-05-03 DIAGNOSIS — C786 Secondary malignant neoplasm of retroperitoneum and peritoneum: Secondary | ICD-10-CM | POA: Diagnosis not present

## 2018-05-03 DIAGNOSIS — D61818 Other pancytopenia: Secondary | ICD-10-CM | POA: Insufficient documentation

## 2018-05-03 DIAGNOSIS — N183 Chronic kidney disease, stage 3 (moderate): Secondary | ICD-10-CM | POA: Insufficient documentation

## 2018-05-03 DIAGNOSIS — Z5111 Encounter for antineoplastic chemotherapy: Secondary | ICD-10-CM | POA: Diagnosis not present

## 2018-05-03 DIAGNOSIS — I700 Atherosclerosis of aorta: Secondary | ICD-10-CM | POA: Insufficient documentation

## 2018-05-03 DIAGNOSIS — I2510 Atherosclerotic heart disease of native coronary artery without angina pectoris: Secondary | ICD-10-CM | POA: Insufficient documentation

## 2018-05-03 DIAGNOSIS — T451X5S Adverse effect of antineoplastic and immunosuppressive drugs, sequela: Secondary | ICD-10-CM | POA: Insufficient documentation

## 2018-05-03 DIAGNOSIS — E1122 Type 2 diabetes mellitus with diabetic chronic kidney disease: Secondary | ICD-10-CM | POA: Diagnosis not present

## 2018-05-03 DIAGNOSIS — C481 Malignant neoplasm of specified parts of peritoneum: Secondary | ICD-10-CM

## 2018-05-03 DIAGNOSIS — Z90710 Acquired absence of both cervix and uterus: Secondary | ICD-10-CM | POA: Diagnosis not present

## 2018-05-03 MED ORDER — SODIUM CHLORIDE 0.9 % IV SOLN
Freq: Once | INTRAVENOUS | Status: AC
  Administered 2018-05-03: 10:00:00 via INTRAVENOUS

## 2018-05-03 MED ORDER — PEGFILGRASTIM 6 MG/0.6ML ~~LOC~~ PSKT
PREFILLED_SYRINGE | SUBCUTANEOUS | Status: AC
  Filled 2018-05-03: qty 0.6

## 2018-05-03 MED ORDER — PALONOSETRON HCL INJECTION 0.25 MG/5ML
INTRAVENOUS | Status: AC
  Filled 2018-05-03: qty 5

## 2018-05-03 MED ORDER — SODIUM CHLORIDE 0.9% FLUSH
10.0000 mL | INTRAVENOUS | Status: DC | PRN
  Administered 2018-05-03: 10 mL
  Filled 2018-05-03: qty 10

## 2018-05-03 MED ORDER — HEPARIN SOD (PORK) LOCK FLUSH 100 UNIT/ML IV SOLN
500.0000 [IU] | Freq: Once | INTRAVENOUS | Status: AC | PRN
Start: 2018-05-03 — End: 2018-05-03
  Administered 2018-05-03: 500 [IU]
  Filled 2018-05-03: qty 5

## 2018-05-03 MED ORDER — SODIUM CHLORIDE 0.9 % IV SOLN
500.0000 mg/m2 | Freq: Once | INTRAVENOUS | Status: AC
  Administered 2018-05-03: 1026 mg via INTRAVENOUS
  Filled 2018-05-03: qty 26.98

## 2018-05-03 MED ORDER — PALONOSETRON HCL INJECTION 0.25 MG/5ML
0.2500 mg | Freq: Once | INTRAVENOUS | Status: AC
  Administered 2018-05-03: 0.25 mg via INTRAVENOUS

## 2018-05-03 MED ORDER — SODIUM CHLORIDE 0.9 % IV SOLN
Freq: Once | INTRAVENOUS | Status: AC
  Administered 2018-05-03: 11:00:00 via INTRAVENOUS
  Filled 2018-05-03: qty 5

## 2018-05-03 MED ORDER — POTASSIUM CHLORIDE 2 MEQ/ML IV SOLN
Freq: Once | INTRAVENOUS | Status: AC
  Administered 2018-05-03: 10:00:00 via INTRAVENOUS
  Filled 2018-05-03: qty 10

## 2018-05-03 MED ORDER — PEGFILGRASTIM 6 MG/0.6ML ~~LOC~~ PSKT
6.0000 mg | PREFILLED_SYRINGE | Freq: Once | SUBCUTANEOUS | Status: AC
  Administered 2018-05-03: 6 mg via SUBCUTANEOUS

## 2018-05-03 MED ORDER — SODIUM CHLORIDE 0.9 % IV SOLN
50.0000 mg | Freq: Once | INTRAVENOUS | Status: AC
  Administered 2018-05-03: 50 mg via INTRAVENOUS
  Filled 2018-05-03: qty 50

## 2018-05-03 NOTE — Patient Instructions (Signed)
[facility] Discharge Instructions for Patients Receiving Chemotherapy  Today you received the following chemotherapy agents: Gemzar, Cisplatin  To help prevent nausea and vomiting after your treatment, we encourage you to take your nausea medication as directed.   If you develop nausea and vomiting that is not controlled by your nausea medication, call the clinic.   BELOW ARE SYMPTOMS THAT SHOULD BE REPORTED IMMEDIATELY:  *FEVER GREATER THAN 100.5 F  *CHILLS WITH OR WITHOUT FEVER  NAUSEA AND VOMITING THAT IS NOT CONTROLLED WITH YOUR NAUSEA MEDICATION  *UNUSUAL SHORTNESS OF BREATH  *UNUSUAL BRUISING OR BLEEDING  TENDERNESS IN MOUTH AND THROAT WITH OR WITHOUT PRESENCE OF ULCERS  *URINARY PROBLEMS  *BOWEL PROBLEMS  UNUSUAL RASH Items with * indicate a potential emergency and should be followed up as soon as possible.  Feel free to call the clinic should you have any questions or concerns. The clinic phone number is [phone].  Please show the [place] at check-in to the Emergency Department and triage nurse.  Pegfilgrastim injection What is this medicine? PEGFILGRASTIM (PEG fil gra stim) is a long-acting granulocyte colony-stimulating factor that stimulates the growth of neutrophils, a type of white blood cell important in the body's fight against infection. It is used to reduce the incidence of fever and infection in patients with certain types of cancer who are receiving chemotherapy that affects the bone marrow, and to increase survival after being exposed to high doses of radiation. This medicine may be used for other purposes; ask your health care provider or pharmacist if you have questions. COMMON BRAND NAME(S): Neulasta What should I tell my health care provider before I take this medicine? They need to know if you have any of these conditions: -kidney disease -latex allergy -ongoing radiation therapy -sickle cell disease -skin reactions  to acrylic adhesives (On-Body Injector only) -an unusual or allergic reaction to pegfilgrastim, filgrastim, other medicines, foods, dyes, or preservatives -pregnant or trying to get pregnant -breast-feeding How should I use this medicine? This medicine is for injection under the skin. If you get this medicine at home, you will be taught how to prepare and give the pre-filled syringe or how to use the On-body Injector. Refer to the patient Instructions for Use for detailed instructions. Use exactly as directed. Tell your healthcare provider immediately if you suspect that the On-body Injector may not have performed as intended or if you suspect the use of the On-body Injector resulted in a missed or partial dose. It is important that you put your used needles and syringes in a special sharps container. Do not put them in a trash can. If you do not have a sharps container, call your pharmacist or healthcare provider to get one. Talk to your pediatrician regarding the use of this medicine in children. While this drug may be prescribed for selected conditions, precautions do apply. Overdosage: If you think you have taken too much of this medicine contact a poison control center or emergency room at once. NOTE: This medicine is only for you. Do not share this medicine with others. What if I miss a dose? It is important not to miss your dose. Call your doctor or health care professional if you miss your dose. If you miss a dose due to an On-body Injector failure or leakage, a new dose should be administered as soon as possible using a single prefilled syringe for manual use. What may interact with this medicine? Interactions have not been studied. Give your  health care provider a list of all the medicines, herbs, non-prescription drugs, or dietary supplements you use. Also tell them if you smoke, drink alcohol, or use illegal drugs. Some items may interact with your medicine. This list may not describe all  possible interactions. Give your health care provider a list of all the medicines, herbs, non-prescription drugs, or dietary supplements you use. Also tell them if you smoke, drink alcohol, or use illegal drugs. Some items may interact with your medicine. What should I watch for while using this medicine? You may need blood work done while you are taking this medicine. If you are going to need a MRI, CT scan, or other procedure, tell your doctor that you are using this medicine (On-Body Injector only). What side effects may I notice from receiving this medicine? Side effects that you should report to your doctor or health care professional as soon as possible: -allergic reactions like skin rash, itching or hives, swelling of the face, lips, or tongue -dizziness -fever -pain, redness, or irritation at site where injected -pinpoint red spots on the skin -red or dark-brown urine -shortness of breath or breathing problems -stomach or side pain, or pain at the shoulder -swelling -tiredness -trouble passing urine or change in the amount of urine Side effects that usually do not require medical attention (report to your doctor or health care professional if they continue or are bothersome): -bone pain -muscle pain This list may not describe all possible side effects. Call your doctor for medical advice about side effects. You may report side effects to FDA at 1-800-FDA-1088. Where should I keep my medicine? Keep out of the reach of children. Store pre-filled syringes in a refrigerator between 2 and 8 degrees C (36 and 46 degrees F). Do not freeze. Keep in carton to protect from light. Throw away this medicine if it is left out of the refrigerator for more than 48 hours. Throw away any unused medicine after the expiration date. NOTE: This sheet is a summary. It may not cover all possible information. If you have questions about this medicine, talk to your doctor, pharmacist, or health care  provider.  2018 Elsevier/Gold Standard (2016-10-16 12:58:03)

## 2018-05-14 ENCOUNTER — Telehealth: Payer: Self-pay

## 2018-05-14 ENCOUNTER — Other Ambulatory Visit: Payer: Self-pay

## 2018-05-14 DIAGNOSIS — C569 Malignant neoplasm of unspecified ovary: Secondary | ICD-10-CM

## 2018-05-14 MED ORDER — HYDROCODONE-ACETAMINOPHEN 10-325 MG PO TABS: 1.0000 | ORAL_TABLET | ORAL | 0 refills | Status: DC | PRN

## 2018-05-14 MED FILL — HYDROCODON-APAP 10-325: 10-325 | 15 days supply | Qty: 90 | Fill #0

## 2018-05-14 NOTE — Telephone Encounter (Signed)
Prescription for hydrocodone placed in the script binder in triage.  Pt is aware.

## 2018-05-16 ENCOUNTER — Other Ambulatory Visit: Payer: Self-pay | Admitting: Hematology and Oncology

## 2018-05-16 DIAGNOSIS — C569 Malignant neoplasm of unspecified ovary: Secondary | ICD-10-CM

## 2018-05-20 ENCOUNTER — Inpatient Hospital Stay (HOSPITAL_BASED_OUTPATIENT_CLINIC_OR_DEPARTMENT_OTHER): Payer: Medicare Other | Admitting: Hematology and Oncology

## 2018-05-20 ENCOUNTER — Inpatient Hospital Stay: Payer: Medicare Other

## 2018-05-20 ENCOUNTER — Telehealth: Payer: Self-pay | Admitting: Hematology and Oncology

## 2018-05-20 ENCOUNTER — Encounter: Payer: Self-pay | Admitting: Hematology and Oncology

## 2018-05-20 ENCOUNTER — Other Ambulatory Visit: Payer: Self-pay | Admitting: *Deleted

## 2018-05-20 ENCOUNTER — Other Ambulatory Visit: Payer: Self-pay | Admitting: Hematology and Oncology

## 2018-05-20 DIAGNOSIS — Z90710 Acquired absence of both cervix and uterus: Secondary | ICD-10-CM

## 2018-05-20 DIAGNOSIS — Z90722 Acquired absence of ovaries, bilateral: Secondary | ICD-10-CM

## 2018-05-20 DIAGNOSIS — C578 Malignant neoplasm of overlapping sites of female genital organs: Secondary | ICD-10-CM

## 2018-05-20 DIAGNOSIS — C786 Secondary malignant neoplasm of retroperitoneum and peritoneum: Secondary | ICD-10-CM | POA: Diagnosis not present

## 2018-05-20 DIAGNOSIS — E1122 Type 2 diabetes mellitus with diabetic chronic kidney disease: Secondary | ICD-10-CM

## 2018-05-20 DIAGNOSIS — C481 Malignant neoplasm of specified parts of peritoneum: Secondary | ICD-10-CM

## 2018-05-20 DIAGNOSIS — E1121 Type 2 diabetes mellitus with diabetic nephropathy: Secondary | ICD-10-CM

## 2018-05-20 DIAGNOSIS — I2510 Atherosclerotic heart disease of native coronary artery without angina pectoris: Secondary | ICD-10-CM

## 2018-05-20 DIAGNOSIS — Z79899 Other long term (current) drug therapy: Secondary | ICD-10-CM

## 2018-05-20 DIAGNOSIS — D61818 Other pancytopenia: Secondary | ICD-10-CM

## 2018-05-20 DIAGNOSIS — N183 Chronic kidney disease, stage 3 unspecified: Secondary | ICD-10-CM

## 2018-05-20 DIAGNOSIS — Z794 Long term (current) use of insulin: Secondary | ICD-10-CM

## 2018-05-20 DIAGNOSIS — G620 Drug-induced polyneuropathy: Secondary | ICD-10-CM

## 2018-05-20 DIAGNOSIS — T451X5A Adverse effect of antineoplastic and immunosuppressive drugs, initial encounter: Secondary | ICD-10-CM

## 2018-05-20 DIAGNOSIS — D6481 Anemia due to antineoplastic chemotherapy: Secondary | ICD-10-CM

## 2018-05-20 DIAGNOSIS — I700 Atherosclerosis of aorta: Secondary | ICD-10-CM

## 2018-05-20 DIAGNOSIS — T451X5S Adverse effect of antineoplastic and immunosuppressive drugs, sequela: Secondary | ICD-10-CM

## 2018-05-20 LAB — CBC WITH DIFFERENTIAL/PLATELET
Basophils Absolute: 0 10*3/uL (ref 0.0–0.1)
Basophils Relative: 0 %
EOS ABS: 0.2 10*3/uL (ref 0.0–0.5)
EOS PCT: 4 %
HCT: 29.5 % — ABNORMAL LOW (ref 34.8–46.6)
Hemoglobin: 10 g/dL — ABNORMAL LOW (ref 11.6–15.9)
LYMPHS ABS: 0.4 10*3/uL — AB (ref 0.9–3.3)
Lymphocytes Relative: 7 %
MCH: 36.6 pg — AB (ref 25.1–34.0)
MCHC: 33.7 g/dL (ref 31.5–36.0)
MCV: 108.6 fL — ABNORMAL HIGH (ref 79.5–101.0)
MONO ABS: 0.7 10*3/uL (ref 0.1–0.9)
MONOS PCT: 11 %
Neutro Abs: 5.1 10*3/uL (ref 1.5–6.5)
Neutrophils Relative %: 78 %
PLATELETS: 142 10*3/uL — AB (ref 145–400)
RBC: 2.72 MIL/uL — ABNORMAL LOW (ref 3.70–5.45)
RDW: 16.9 % — ABNORMAL HIGH (ref 11.2–14.5)
WBC: 6.5 10*3/uL (ref 3.9–10.3)

## 2018-05-20 LAB — COMPREHENSIVE METABOLIC PANEL
ALT: 18 U/L (ref 0–44)
ANION GAP: 10 (ref 5–15)
AST: 16 U/L (ref 15–41)
Albumin: 4 g/dL (ref 3.5–5.0)
Alkaline Phosphatase: 148 U/L — ABNORMAL HIGH (ref 38–126)
BUN: 31 mg/dL — ABNORMAL HIGH (ref 8–23)
CALCIUM: 9.2 mg/dL (ref 8.9–10.3)
CHLORIDE: 100 mmol/L (ref 98–111)
CO2: 28 mmol/L (ref 22–32)
Creatinine, Ser: 1.16 mg/dL — ABNORMAL HIGH (ref 0.44–1.00)
GFR, EST AFRICAN AMERICAN: 56 mL/min — AB (ref >60)
GFR, EST NON AFRICAN AMERICAN: 48 mL/min — AB (ref >60)
Glucose, Bld: 123 mg/dL — ABNORMAL HIGH (ref 70–99)
Potassium: 4.9 mmol/L (ref 3.5–5.1)
SODIUM: 138 mmol/L (ref 135–145)
Total Bilirubin: 0.3 mg/dL (ref 0.3–1.2)
Total Protein: 6.9 g/dL (ref 6.5–8.1)

## 2018-05-20 LAB — SAMPLE TO BLOOD BANK

## 2018-05-20 LAB — MAGNESIUM: Magnesium: 1.6 mg/dL — ABNORMAL LOW (ref 1.7–2.4)

## 2018-05-20 MED ORDER — MAGNESIUM OXIDE 400 (241.3 MG) MG PO TABS: 400.0000 mg | ORAL_TABLET | Freq: Three times a day (TID) | ORAL | 3 refills | Status: DC

## 2018-05-20 NOTE — Assessment & Plan Note (Signed)
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

## 2018-05-20 NOTE — Progress Notes (Signed)
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 We will continue modified dosing and I plan to modify her schedule further to get chemo every 3 weeks to allow blood count recovery She will continue G-CSF support after treatment Since we have modify her dosing, she has been transfusion independent and has no side effects from  GCSF support or risk of infection She does not need transfusion today She will continue chemotherapy as scheduled If her tumor marker continues to respond to treatment, I do not plan to repeat imaging study until September of this year  Pancytopenia, acquired [facility]) She has persistent pancytopenia despite multiple dosage adjustment She is doing better when the treatment interval is lengthened to every 3 weeks She agree with the plan of care I recommend she continues on G-CSF support after each cycle She does not need transfusion today  Chemotherapy-induced neuropathy ([place]) 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   INTERVAL HISTORY: Please see below for problem oriented charting. 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 This may potentially be ovarian or a GI in etiology.      04/30/2016 Tumor Marker    CA 125- 7149.0 (H)      05/01/2016 Procedure    US paracentesis- Successful ultrasound-guided paracentesis yielding 1.8 liters of peritoneal fluid.      05/01/2016 Imaging    US pelvis- Both transabdominal and transvaginal sonography are significantly limited by large patient habitus and ascites. Neither uterus or ovaries were visualized on this exam.      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      05/08/2016 Imaging    CT chest- No evidence of metastatic disease in the chest. Peritoneal/omental disease with abdominal ascites in the upper abdomen, incompletely visualized.       05/13/2016 Procedure    Placement of single lumen port a cath via right internal jugular vein. The catheter tip lies at the cavoatrial junction. A power injectable port a cath was placed and is ready for immediate use.      05/15/2016 Procedure    US Paracentesis- 3400 ml yellow colored ascites removed      05/15/2016 - 09/18/2016 Chemotherapy    Carboplatin/Paclitaxel every 21 days x 7 cycles  07/01/2016 Miscellaneous    Genetic Counseling by [name]-  Genetic testing was normal, and did not reveal a deleterious mutation in these genes.       07/08/2016 Imaging    CT CAP- 1. Small volume ascites, significantly decreased. 2. Stable diffuse omental soft tissue caking  and diffuse peritoneal thickening along the bilateral paracolic gutters and bilateral pelvic peritoneal reflections, consistent with peritoneal carcinomatosis. 3. Stable asymmetrically enlarged right ovary, which may represent the primary site of ovarian malignancy. 4. No evidence of metastatic disease in the chest. No new sites of metastatic disease in the abdomen or pelvis.      07/09/2016 Miscellaneous    Gyn Onc re-evaluation- modest response to therapy, 3 more cycles of chemotherapy recommended.        09/11/2016 Imaging    CT C/A/P No significant change omental soft tissue caking, consistent with metastatic disease. Mild ascites is decreased since previous study.  Increased calcification along peritoneal surface in pelvic cul-de-sac, consistent with treated peritoneal metastatic disease.  Stable 4.5cm homogeneous right pelvic mass, which favors a uterine fibroid although right ovarian neoplasm cannot definitely be excluded.  No new or progressive metastatic disease identified. No evidence of metastatic disease within the thorax.       10/14/2016 Procedure    Robotic-assisted laparoscopic total hysterectomy with bilateral salpingoophorectomy, ex lap omentectomy, radical tumor debulking by Dr. Denman George      10/17/2016 Pathology Results    Diagnosis 1. 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 - CERVIX AND ENDOMETRIUM FREE OF TUMOR. - SEE ONCOLOGY TABLE AND COMMENT. 2. Soft tissue, biopsy, umbilical nodule - HIGH GRADE SEROUS CARCINOMA. 3. Omentum, resection for tumor - HIGH GRADE SEROUS CARCINOMA, 33 CM.      11/06/2016 - 12/25/2016 Chemotherapy    Carboplatin/Paclitaxel x 3 cycles       01/12/2017 Imaging    CT CAP- 1. Interval hysterectomy, bilateral salpingo-oophorectomy and omentectomy without evidence of tumor recurrence. 2. No evidence of metastatic  disease. 3. 5 mm nonobstructing lower pole left renal calculus.      01/13/2017 Remission    No evidence of residual disease on CT imaging.      05/04/2017 Imaging    CT CAP- New small amount of ascites within the abdomen and pelvis since 01/12/2017 which could indicate disease progression but no new identifiable tumor and no significant change in omental and mild peritoneal thickening.  No evidence of metastatic disease within the chest.  Coronary artery disease.  Aortic Atherosclerosis (ICD10-I70.0).      05/04/2017 Tumor Marker    Patient's tumor was tested for the following markers: CA 125 Results of the tumor marker test revealed 6283      05/18/2017 Imaging    ECHO; EF 60% -  65      05/25/2017 - 07/07/2017 Chemotherapy    She received Doxil and Avastin. Treatment is stopped due to disease progression      06/01/2017 Procedure    Successful ultrasound-guided therapeutic paracentesis yielding 2.8 liters of peritoneal fluid      06/22/2017 Tumor Marker    Patient's tumor was tested for the following markers: CA 125 Results of the tumor marker test revealed 13440      07/20/2017 Imaging    Mild increase in peritoneal carcinoma within abdomen pelvis since previous study. No significant change and minimal ascites.  No evidence of metastatic disease within the thorax. New mild airspace opacity  in left lower lobe, consistent with inflammatory or infectious etiology.      07/30/2017 Tumor Marker    Patient's tumor was tested for the following markers: CA 125 Results of the tumor marker test revealed 12099      07/30/2017 -  Chemotherapy    The patient had gemzar. Cisplatin is added on 11/02/18      08/03/2017 - 08/05/2017 Hospital Admission    She was admitted to the hospital for management of UTI and neutropenic fever      08/13/2017 Adverse Reaction    Dose of chemotherapy is reduced due to neutropenic sepsis      08/24/2017 Tumor Marker    Patient's tumor was  tested for the following markers: CA 125 Results of the tumor marker test revealed 7431      09/21/2017 Tumor Marker    Patient's tumor was tested for the following markers: CA 125 Results of the tumor marker test revealed 4176      10/09/2017 Imaging    No significant change in peritoneal carcinomatosis since previous study.  No new or progressive metastatic disease within the abdomen or pelvis.  Stable tiny nonobstructive left renal calculus. No evidence of ureteral calculi or hydronephrosis.      11/10/2017 Tumor Marker    Patient's tumor was tested for the following markers: CA 125 Results of the tumor marker test revealed 4257      11/24/2017 Tumor Marker    Patient's tumor was tested for the following markers: CA 125 Results of the tumor marker test revealed 3377      01/12/2018 Tumor Marker    Patient's tumor was tested for the following markers: CA 125 Results of the tumor marker test revealed 1595      01/21/2018 Imaging    1. Stable to slightly improved omental, gastrohepatic ligament and pelvic disease as detailed above. No new/progressive findings. 2. No acute abdominal or pelvic findings. 3. Stable lower pole left renal calculus.      02/08/2018 Tumor Marker    Patient's tumor was tested for the following markers: CA 125 Results of the tumor marker test revealed 1058      03/08/2018 Tumor Marker    Patient's tumor was tested for the following markers: CA-125 Results of the tumor marker test revealed 737.1      04/02/2018 Tumor Marker    Patient's tumor was tested for the following markers: CA-125 Results of the tumor marker test revealed 542.8       REVIEW OF SYSTEMS:   Constitutional: Denies fevers, chills or abnormal weight loss Eyes: Denies blurriness of vision Ears, nose, mouth, throat, and face: Denies mucositis or sore throat Respiratory: Denies cough, dyspnea or wheezes Cardiovascular: Denies palpitation, chest discomfort or lower extremity  swelling Gastrointestinal:  Denies nausea, heartburn or change in bowel habits Skin: Denies abnormal skin rashes Lymphatics: Denies new lymphadenopathy or easy bruising Neurological:Denies numbness, tingling or new weaknesses Behavioral/Psych: Mood is stable, no new changes  All other systems were reviewed with the patient and are negative.  I have reviewed the past medical history, past surgical history, social history and family history with the patient and they are unchanged from previous note.  ALLERGIES:  is allergic to avandia [rosiglitazone]; micronase [glyburide]; and actos [pioglitazone].  MEDICATIONS:  Current Outpatient Medications  Medication Sig Dispense Refill  . atorvastatin (LIPITOR) 40 MG tablet Take 1 tablet (40 mg total) by mouth daily. 90 tablet 3  . benazepril (LOTENSIN) 10 MG tablet Take 0.5 tablets (  5 mg total) by mouth daily. 45 tablet 2  . Biotin 1000 MCG tablet Take 1,000 mcg by mouth 3 (three) times daily.    . furosemide (LASIX) 20 MG tablet TAKE 1 TABLET(20 MG) BY MOUTH THREE TIMES DAILY 90 tablet 0  . gabapentin (NEURONTIN) 300 MG capsule Take 1 capsule (300 mg total) by mouth 3 (three) times daily. 90 capsule 11  . glucose blood (ONE TOUCH ULTRA TEST) test strip USE TO CHECK BLOOD SUGAR UP TO 5 TIMES A DAY 500 each 3  . HYDROcodone-acetaminophen (NORCO) 10-325 MG tablet Take 1 tablet by mouth every 4 (four) hours as needed. 90 tablet 0  . insulin aspart (NOVOLOG) 100 UNIT/ML injection USE IN INSULIN PUMP AS DIRECTED. MAX DAILY DOSE OF 110 UNITS PER DAY 110 mL 2  . lidocaine-prilocaine (EMLA) cream Apply a quarter size amount to port site 1 hour prior to chemo. Do not rub in. Cover with plastic wrap. 30 g 3  . loperamide (IMODIUM A-D) 2 MG tablet Take 1 tablet (2 mg total) by mouth 4 (four) times daily as needed for diarrhea or loose stools. 30 tablet 0  . loratadine (CLARITIN) 10 MG tablet Take 10 mg by mouth daily.    . magnesium oxide (MAG-OX) 400 (241.3 Mg)  MG tablet Take 1 tablet (400 mg total) by mouth 3 (three) times daily. 90 tablet 3  . metFORMIN (GLUCOPHAGE) 1000 MG tablet TAKE 1 TABLET BY MOUTH TWICE DAILY WITH A MEAL 180 tablet 3  . Multiple Vitamin (MULTIVITAMIN WITH MINERALS) TABS Take 1 tablet by mouth daily.    . multivitamin-lutein (OCUVITE-LUTEIN) CAPS capsule Take 1 capsule by mouth daily.    Marland Kitchen omeprazole (PRILOSEC) 40 MG capsule Take 1 capsule (40 mg total) daily by mouth. 90 capsule 3  . ondansetron (ZOFRAN) 8 MG tablet Take 1 tablet (8 mg total) by mouth every 8 (eight) hours as needed for nausea or vomiting. 90 tablet 3  . polyethylene glycol (MIRALAX / GLYCOLAX) packet Take 17 g by mouth daily as needed for mild constipation.     . prochlorperazine (COMPAZINE) 10 MG tablet Take 1 tablet (10 mg total) by mouth every 6 (six) hours as needed for nausea or vomiting. 30 tablet 0  . senna (SENOKOT) 8.6 MG TABS tablet Take 1 tablet (8.6 mg total) by mouth at bedtime as needed for moderate constipation. 120 each 0  . vitamin B-12 (CYANOCOBALAMIN) 1000 MCG tablet Take 1,000 mcg by mouth daily.    . vitamin E (VITAMIN E) 400 UNIT capsule Take 400 Units by mouth daily.     No current facility-administered medications for this visit.     PHYSICAL EXAMINATION: ECOG PERFORMANCE STATUS: 1 - Symptomatic but completely ambulatory  Vitals:   05/20/18 0913  BP: (!) 133/51  Pulse: 68  Resp: 18  Temp: 98.1 F (36.7 C)  SpO2: 97%   Filed Weights   05/20/18 0913  Weight: 215 lb 1.6 oz (97.6 kg)    GENERAL:alert, no distress and comfortable SKIN: skin color, texture, turgor are normal, no rashes or significant lesions EYES: normal, Conjunctiva are pink and non-injected, sclera clear OROPHARYNX:no exudate, no erythema and lips, buccal mucosa, and tongue normal  NECK: supple, thyroid normal size, non-tender, without nodularity LYMPH:  no palpable lymphadenopathy in the cervical, axillary or inguinal LUNGS: clear to auscultation and  percussion with normal breathing effort HEART: regular rate & rhythm and no murmurs and no lower extremity edema ABDOMEN:abdomen soft, non-tender and normal bowel sounds Musculoskeletal:no  cyanosis of digits and no clubbing  NEURO: alert & oriented x 3 with fluent speech, no focal motor/sensory deficits  LABORATORY DATA:  I have reviewed the data as listed    Component Value Date/Time   NA 138 05/20/2018 0834   NA 139 11/02/2017 0756   K 4.9 05/20/2018 0834   K 4.7 11/02/2017 0756   CL 100 05/20/2018 0834   CO2 28 05/20/2018 0834   CO2 26 11/02/2017 0756   GLUCOSE 123 (H) 05/20/2018 0834   GLUCOSE 140 11/02/2017 0756   BUN 31 (H) 05/20/2018 0834   BUN 23.7 11/02/2017 0756   CREATININE 1.16 (H) 05/20/2018 0834   CREATININE 0.9 11/02/2017 0756   CALCIUM 9.2 05/20/2018 0834   CALCIUM 9.4 11/02/2017 0756   PROT 6.9 05/20/2018 0834   PROT 7.0 11/02/2017 0756   ALBUMIN 4.0 05/20/2018 0834   ALBUMIN 3.5 11/02/2017 0756   AST 16 05/20/2018 0834   AST 18 11/02/2017 0756   ALT 18 05/20/2018 0834   ALT 23 11/02/2017 0756   ALKPHOS 148 (H) 05/20/2018 0834   ALKPHOS 108 11/02/2017 0756   BILITOT 0.3 05/20/2018 0834   BILITOT 0.24 11/02/2017 0756   GFRNONAA 48 (L) 05/20/2018 0834   GFRAA 56 (L) 05/20/2018 0834    No results found for: SPEP, UPEP  Lab Results  Component Value Date   WBC 6.5 05/20/2018   NEUTROABS 5.1 05/20/2018   HGB 10.0 (L) 05/20/2018   HCT 29.5 (L) 05/20/2018   MCV 108.6 (H) 05/20/2018   PLT 142 (L) 05/20/2018      Chemistry      Component Value Date/Time   NA 138 05/20/2018 0834   NA 139 11/02/2017 0756   K 4.9 05/20/2018 0834   K 4.7 11/02/2017 0756   CL 100 05/20/2018 0834   CO2 28 05/20/2018 0834   CO2 26 11/02/2017 0756   BUN 31 (H) 05/20/2018 0834   BUN 23.7 11/02/2017 0756   CREATININE 1.16 (H) 05/20/2018 0834   CREATININE 0.9 11/02/2017 0756      Component Value Date/Time   CALCIUM 9.2 05/20/2018 0834   CALCIUM 9.4 11/02/2017 0756    ALKPHOS 148 (H) 05/20/2018 0834   ALKPHOS 108 11/02/2017 0756   AST 16 05/20/2018 0834   AST 18 11/02/2017 0756   ALT 18 05/20/2018 0834   ALT 23 11/02/2017 0756   BILITOT 0.3 05/20/2018 0834   BILITOT 0.24 11/02/2017 0756      All questions were answered. The patient knows to call the clinic with any problems, questions or concerns. No barriers to learning was detected.  I spent 15 minutes counseling the patient face to face. The total time spent in the appointment was 20 minutes and more than 50% was on counseling and review of test results  Heath Lark, MD 05/20/2018 12:56 PM

## 2018-05-20 NOTE — Assessment & Plan Note (Signed)
She has persistent pancytopenia despite multiple dosage adjustment She is doing better when the treatment interval is lengthened to every 3 weeks She agree with the plan of care I recommend she continues on G-CSF support after each cycle She does not need transfusion today

## 2018-05-20 NOTE — Assessment & Plan Note (Signed)
She will continue oral magnesium replacement therapy 3 times a day as scheduled

## 2018-05-20 NOTE — Assessment & Plan Note (Signed)
Overall, she is responding well to treatment since addition of cisplatin, although that was causing some issues with low blood count requiring transfusion. We will continue modified dosing and I plan to modify her schedule further to get chemo every 3 weeks to allow blood count recovery She will continue G-CSF support after treatment Since we have modify her dosing, she has been transfusion independent and has no side effects from  GCSF support or risk of infection She does not need transfusion today She will continue chemotherapy as scheduled If her tumor marker continues to respond to treatment, I do not plan to repeat imaging study until September of this year

## 2018-05-20 NOTE — Assessment & Plan Note (Signed)
She has chronic kidney disease stage III, stable She will continue risk factor modification

## 2018-05-20 NOTE — Telephone Encounter (Signed)
Per 7/18 los, no orders or referrals.

## 2018-05-21 ENCOUNTER — Inpatient Hospital Stay: Payer: Medicare Other

## 2018-05-21 DIAGNOSIS — C578 Malignant neoplasm of overlapping sites of female genital organs: Secondary | ICD-10-CM | POA: Diagnosis not present

## 2018-05-21 DIAGNOSIS — C481 Malignant neoplasm of specified parts of peritoneum: Secondary | ICD-10-CM

## 2018-05-21 MED ORDER — HEPARIN SOD (PORK) LOCK FLUSH 100 UNIT/ML IV SOLN
500.0000 [IU] | Freq: Once | INTRAVENOUS | Status: AC | PRN
  Administered 2018-05-21: 500 [IU]
  Filled 2018-05-21: qty 5

## 2018-05-21 MED ORDER — PEGFILGRASTIM 6 MG/0.6ML ~~LOC~~ PSKT
PREFILLED_SYRINGE | SUBCUTANEOUS | Status: AC
  Filled 2018-05-21: qty 0.6

## 2018-05-21 MED ORDER — CISPLATIN CHEMO INJECTION 100MG/100ML
50.0000 mg | Freq: Once | INTRAVENOUS | Status: AC
  Administered 2018-05-21: 50 mg via INTRAVENOUS
  Filled 2018-05-21: qty 50

## 2018-05-21 MED ORDER — PALONOSETRON HCL INJECTION 0.25 MG/5ML
INTRAVENOUS | Status: AC
  Filled 2018-05-21: qty 5

## 2018-05-21 MED ORDER — SODIUM CHLORIDE 0.9 % IV SOLN
500.0000 mg/m2 | Freq: Once | INTRAVENOUS | Status: AC
  Administered 2018-05-21: 1026 mg via INTRAVENOUS
  Filled 2018-05-21: qty 26.98

## 2018-05-21 MED ORDER — PALONOSETRON HCL INJECTION 0.25 MG/5ML
0.2500 mg | Freq: Once | INTRAVENOUS | Status: AC
  Administered 2018-05-21: 0.25 mg via INTRAVENOUS

## 2018-05-21 MED ORDER — SODIUM CHLORIDE 0.9 % IV SOLN
Freq: Once | INTRAVENOUS | Status: AC
  Administered 2018-05-21: 11:00:00 via INTRAVENOUS
  Filled 2018-05-21: qty 5

## 2018-05-21 MED ORDER — SODIUM CHLORIDE 0.9 % IV SOLN
Freq: Once | INTRAVENOUS | Status: AC
  Administered 2018-05-21: 09:00:00 via INTRAVENOUS

## 2018-05-21 MED ORDER — SODIUM CHLORIDE 0.9% FLUSH
10.0000 mL | INTRAVENOUS | Status: DC | PRN
  Administered 2018-05-21: 10 mL
  Filled 2018-05-21: qty 10

## 2018-05-21 MED ORDER — PEGFILGRASTIM 6 MG/0.6ML ~~LOC~~ PSKT
6.0000 mg | PREFILLED_SYRINGE | Freq: Once | SUBCUTANEOUS | Status: AC
  Administered 2018-05-21: 6 mg via SUBCUTANEOUS

## 2018-05-21 MED ORDER — POTASSIUM CHLORIDE 2 MEQ/ML IV SOLN
Freq: Once | INTRAVENOUS | Status: AC
  Administered 2018-05-21: 09:00:00 via INTRAVENOUS
  Filled 2018-05-21: qty 10

## 2018-05-21 NOTE — Patient Instructions (Signed)
[facility] Discharge Instructions for Patients Receiving Chemotherapy  Today you received the following chemotherapy agents :  Gemcitabine,  Cisplatin.  To help prevent nausea and vomiting after your treatment, we encourage you to take your nausea medication as prescribed.   If you develop nausea and vomiting that is not controlled by your nausea medication, call the clinic.   BELOW ARE SYMPTOMS THAT SHOULD BE REPORTED IMMEDIATELY:  *FEVER GREATER THAN 100.5 F  *CHILLS WITH OR WITHOUT FEVER  NAUSEA AND VOMITING THAT IS NOT CONTROLLED WITH YOUR NAUSEA MEDICATION  *UNUSUAL SHORTNESS OF BREATH  *UNUSUAL BRUISING OR BLEEDING  TENDERNESS IN MOUTH AND THROAT WITH OR WITHOUT PRESENCE OF ULCERS  *URINARY PROBLEMS  *BOWEL PROBLEMS  UNUSUAL RASH Items with * indicate a potential emergency and should be followed up as soon as possible.  Feel free to call the clinic should you have any questions or concerns. The clinic phone number is [phone].  Please show the CHEMO ALERT CARD at check-in to the Emergency Department and triage nurse.   

## 2018-05-22 ENCOUNTER — Other Ambulatory Visit: Payer: Self-pay | Admitting: Emergency Medicine

## 2018-05-22 ENCOUNTER — Telehealth: Payer: Self-pay | Admitting: Emergency Medicine

## 2018-05-22 DIAGNOSIS — C569 Malignant neoplasm of unspecified ovary: Secondary | ICD-10-CM

## 2018-05-22 MED ORDER — PEGFILGRASTIM INJECTION 6 MG/0.6ML ~~LOC~~: 6.0000 mg | PREFILLED_SYRINGE | Freq: Once | SUBCUTANEOUS | Status: DC

## 2018-05-22 NOTE — Telephone Encounter (Signed)
Pt arrived 7/20 with neulasta onpro kit no longer attached.  Pt called triage line around 1740 on 7/19 who contacted MD Gorsuch.  Pt advised to come in on 7/20 to receive replacement onpro since the infusion had not started.  RN Learta Codding & Santiago Glad assessed, onpro cannot be placed back on pt at this time.  Spoke to MD Alvy Bimler on the phone, verbal order to either give pt a replacement onpro kit today or to have her come back on 7/22 for neulasta shot.  Pt agreed to Monday appt in [facility] for 0900 neulasta shot.  Order placed for neulasta shot to be approved by pharmacy.  Removed onpro kit & placed in biohazard bag with patient label, to give to pharmacy for insurance/disposal purposes.

## 2018-05-24 ENCOUNTER — Inpatient Hospital Stay: Payer: Medicare Other

## 2018-05-24 VITALS — BP 127/54 | HR 86 | Temp 97.8°F | Resp 18

## 2018-05-24 DIAGNOSIS — C481 Malignant neoplasm of specified parts of peritoneum: Secondary | ICD-10-CM

## 2018-05-24 DIAGNOSIS — E538 Deficiency of other specified B group vitamins: Secondary | ICD-10-CM

## 2018-05-24 DIAGNOSIS — C578 Malignant neoplasm of overlapping sites of female genital organs: Secondary | ICD-10-CM | POA: Diagnosis not present

## 2018-05-24 MED ORDER — PEGFILGRASTIM-CBQV 6 MG/0.6ML ~~LOC~~ SOSY
PREFILLED_SYRINGE | SUBCUTANEOUS | Status: AC
Start: 2018-05-24 — End: ?
  Filled 2018-05-24: qty 0.6

## 2018-05-24 MED ORDER — PEGFILGRASTIM-CBQV 6 MG/0.6ML ~~LOC~~ SOSY
6.0000 mg | PREFILLED_SYRINGE | Freq: Once | SUBCUTANEOUS | Status: AC
  Administered 2018-05-24: 6 mg via SUBCUTANEOUS

## 2018-05-24 MED ORDER — PEGFILGRASTIM INJECTION 6 MG/0.6ML ~~LOC~~
PREFILLED_SYRINGE | SUBCUTANEOUS | Status: AC
  Filled 2018-05-24: qty 0.6

## 2018-05-24 NOTE — Patient Instructions (Signed)
Pegfilgrastim injection What is this medicine? PEGFILGRASTIM (PEG fil gra stim) is a long-acting granulocyte colony-stimulating factor that stimulates the growth of neutrophils, a type of white blood cell important in the body's fight against infection. It is used to reduce the incidence of fever and infection in patients with certain types of cancer who are receiving chemotherapy that affects the bone marrow, and to increase survival after being exposed to high doses of radiation. This medicine may be used for other purposes; ask your health care provider or pharmacist if you have questions. COMMON BRAND NAME(S): Neulasta What should I tell my health care provider before I take this medicine? They need to know if you have any of these conditions: -kidney disease -latex allergy -ongoing radiation therapy -sickle cell disease -skin reactions to acrylic adhesives (On-Body Injector only) -an unusual or allergic reaction to pegfilgrastim, filgrastim, other medicines, foods, dyes, or preservatives -pregnant or trying to get pregnant -breast-feeding How should I use this medicine? This medicine is for injection under the skin. If you get this medicine at home, you will be taught how to prepare and give the pre-filled syringe or how to use the On-body Injector. Refer to the patient Instructions for Use for detailed instructions. Use exactly as directed. Tell your healthcare provider immediately if you suspect that the On-body Injector may not have performed as intended or if you suspect the use of the On-body Injector resulted in a missed or partial dose. It is important that you put your used needles and syringes in a special sharps container. Do not put them in a trash can. If you do not have a sharps container, call your pharmacist or healthcare provider to get one. Talk to your pediatrician regarding the use of this medicine in children. While this drug may be prescribed for selected conditions,  precautions do apply. Overdosage: If you think you have taken too much of this medicine contact a poison control center or emergency room at once. NOTE: This medicine is only for you. Do not share this medicine with others. What if I miss a dose? It is important not to miss your dose. Call your doctor or health care professional if you miss your dose. If you miss a dose due to an On-body Injector failure or leakage, a new dose should be administered as soon as possible using a single prefilled syringe for manual use. What may interact with this medicine? Interactions have not been studied. Give your health care provider a list of all the medicines, herbs, non-prescription drugs, or dietary supplements you use. Also tell them if you smoke, drink alcohol, or use illegal drugs. Some items may interact with your medicine. This list may not describe all possible interactions. Give your health care provider a list of all the medicines, herbs, non-prescription drugs, or dietary supplements you use. Also tell them if you smoke, drink alcohol, or use illegal drugs. Some items may interact with your medicine. What should I watch for while using this medicine? You may need blood work done while you are taking this medicine. If you are going to need a MRI, CT scan, or other procedure, tell your doctor that you are using this medicine (On-Body Injector only). What side effects may I notice from receiving this medicine? Side effects that you should report to your doctor or health care professional as soon as possible: -allergic reactions like skin rash, itching or hives, swelling of the face, lips, or tongue -dizziness -fever -pain, redness, or irritation at site   where injected -pinpoint red spots on the skin -red or dark-brown urine -shortness of breath or breathing problems -stomach or side pain, or pain at the shoulder -swelling -tiredness -trouble passing urine or change in the amount of urine Side  effects that usually do not require medical attention (report to your doctor or health care professional if they continue or are bothersome): -bone pain -muscle pain This list may not describe all possible side effects. Call your doctor for medical advice about side effects. You may report side effects to FDA at 1-800-FDA-1088. Where should I keep my medicine? Keep out of the reach of children. Store pre-filled syringes in a refrigerator between 2 and 8 degrees C (36 and 46 degrees F). Do not freeze. Keep in carton to protect from light. Throw away this medicine if it is left out of the refrigerator for more than 48 hours. Throw away any unused medicine after the expiration date. NOTE: This sheet is a summary. It may not cover all possible information. If you have questions about this medicine, talk to your doctor, pharmacist, or health care provider.  2018 Elsevier/Gold Standard (2016-10-16 12:58:03)  

## 2018-06-07 ENCOUNTER — Telehealth: Payer: Self-pay | Admitting: *Deleted

## 2018-06-07 DIAGNOSIS — C569 Malignant neoplasm of unspecified ovary: Secondary | ICD-10-CM

## 2018-06-07 MED ORDER — HYDROCODONE-ACETAMINOPHEN 10-325 MG PO TABS: 1.0000 | ORAL_TABLET | ORAL | 0 refills | Status: DC | PRN

## 2018-06-07 MED FILL — HYDROCODON-APAP 10-325: 10-325 | 15 days supply | Qty: 90 | Fill #0

## 2018-06-07 NOTE — Telephone Encounter (Signed)
Notified that prescription is ready for pick up

## 2018-06-07 NOTE — Telephone Encounter (Signed)
Pt left a message stating she needs a refill of hydrocodone.

## 2018-06-11 ENCOUNTER — Inpatient Hospital Stay: Payer: Medicare Other | Attending: Hematology and Oncology

## 2018-06-11 ENCOUNTER — Other Ambulatory Visit: Payer: Self-pay | Admitting: Hematology and Oncology

## 2018-06-11 ENCOUNTER — Telehealth: Payer: Self-pay | Admitting: Hematology and Oncology

## 2018-06-11 ENCOUNTER — Encounter: Payer: Self-pay | Admitting: Hematology and Oncology

## 2018-06-11 ENCOUNTER — Inpatient Hospital Stay: Payer: Medicare Other | Admitting: Hematology and Oncology

## 2018-06-11 VITALS — BP 117/43 | HR 88 | Temp 98.1°F | Resp 18 | Ht 63.0 in | Wt 223.0 lb

## 2018-06-11 DIAGNOSIS — Z794 Long term (current) use of insulin: Secondary | ICD-10-CM | POA: Insufficient documentation

## 2018-06-11 DIAGNOSIS — T451X5S Adverse effect of antineoplastic and immunosuppressive drugs, sequela: Secondary | ICD-10-CM | POA: Diagnosis not present

## 2018-06-11 DIAGNOSIS — C578 Malignant neoplasm of overlapping sites of female genital organs: Secondary | ICD-10-CM | POA: Diagnosis present

## 2018-06-11 DIAGNOSIS — I2510 Atherosclerotic heart disease of native coronary artery without angina pectoris: Secondary | ICD-10-CM | POA: Diagnosis not present

## 2018-06-11 DIAGNOSIS — C569 Malignant neoplasm of unspecified ovary: Secondary | ICD-10-CM

## 2018-06-11 DIAGNOSIS — Z5111 Encounter for antineoplastic chemotherapy: Secondary | ICD-10-CM | POA: Insufficient documentation

## 2018-06-11 DIAGNOSIS — T451X5A Adverse effect of antineoplastic and immunosuppressive drugs, initial encounter: Secondary | ICD-10-CM | POA: Insufficient documentation

## 2018-06-11 DIAGNOSIS — N183 Chronic kidney disease, stage 3 unspecified: Secondary | ICD-10-CM

## 2018-06-11 DIAGNOSIS — E538 Deficiency of other specified B group vitamins: Secondary | ICD-10-CM | POA: Diagnosis not present

## 2018-06-11 DIAGNOSIS — Z79899 Other long term (current) drug therapy: Secondary | ICD-10-CM | POA: Insufficient documentation

## 2018-06-11 DIAGNOSIS — Z7689 Persons encountering health services in other specified circumstances: Secondary | ICD-10-CM | POA: Insufficient documentation

## 2018-06-11 DIAGNOSIS — C786 Secondary malignant neoplasm of retroperitoneum and peritoneum: Secondary | ICD-10-CM

## 2018-06-11 DIAGNOSIS — D6481 Anemia due to antineoplastic chemotherapy: Secondary | ICD-10-CM | POA: Insufficient documentation

## 2018-06-11 DIAGNOSIS — I952 Hypotension due to drugs: Secondary | ICD-10-CM

## 2018-06-11 DIAGNOSIS — Z90722 Acquired absence of ovaries, bilateral: Secondary | ICD-10-CM | POA: Diagnosis not present

## 2018-06-11 DIAGNOSIS — C481 Malignant neoplasm of specified parts of peritoneum: Secondary | ICD-10-CM

## 2018-06-11 DIAGNOSIS — R180 Malignant ascites: Secondary | ICD-10-CM | POA: Diagnosis not present

## 2018-06-11 DIAGNOSIS — E1122 Type 2 diabetes mellitus with diabetic chronic kidney disease: Secondary | ICD-10-CM | POA: Diagnosis not present

## 2018-06-11 DIAGNOSIS — Z90710 Acquired absence of both cervix and uterus: Secondary | ICD-10-CM

## 2018-06-11 LAB — COMPREHENSIVE METABOLIC PANEL
ALK PHOS: 130 U/L — AB (ref 38–126)
ALT: 14 U/L (ref 0–44)
AST: 16 U/L (ref 15–41)
Albumin: 3.9 g/dL (ref 3.5–5.0)
Anion gap: 13 (ref 5–15)
BUN: 31 mg/dL — AB (ref 8–23)
CALCIUM: 9.4 mg/dL (ref 8.9–10.3)
CO2: 25 mmol/L (ref 22–32)
CREATININE: 1.24 mg/dL — AB (ref 0.44–1.00)
Chloride: 102 mmol/L (ref 98–111)
GFR calc non Af Amer: 45 mL/min — ABNORMAL LOW (ref >60)
GFR, EST AFRICAN AMERICAN: 52 mL/min — AB (ref >60)
GLUCOSE: 101 mg/dL — AB (ref 70–99)
Potassium: 4.5 mmol/L (ref 3.5–5.1)
SODIUM: 140 mmol/L (ref 135–145)
Total Bilirubin: 0.3 mg/dL (ref 0.3–1.2)
Total Protein: 6.8 g/dL (ref 6.5–8.1)

## 2018-06-11 LAB — CBC WITH DIFFERENTIAL/PLATELET
Basophils Absolute: 0 10*3/uL (ref 0.0–0.1)
Basophils Relative: 0 %
EOS ABS: 0.4 10*3/uL (ref 0.0–0.5)
Eosinophils Relative: 5 %
HCT: 30.2 % — ABNORMAL LOW (ref 34.8–46.6)
HEMOGLOBIN: 10 g/dL — AB (ref 11.6–15.9)
LYMPHS ABS: 0.4 10*3/uL — AB (ref 0.9–3.3)
LYMPHS PCT: 5 %
MCH: 36.3 pg — AB (ref 25.1–34.0)
MCHC: 33.2 g/dL (ref 31.5–36.0)
MCV: 109.4 fL — AB (ref 79.5–101.0)
Monocytes Absolute: 0.7 10*3/uL (ref 0.1–0.9)
Monocytes Relative: 9 %
NEUTROS PCT: 81 %
Neutro Abs: 6 10*3/uL (ref 1.5–6.5)
Platelets: 197 10*3/uL (ref 145–400)
RBC: 2.76 MIL/uL — ABNORMAL LOW (ref 3.70–5.45)
RDW: 16.5 % — ABNORMAL HIGH (ref 11.2–14.5)
WBC: 7.5 10*3/uL (ref 3.9–10.3)

## 2018-06-11 LAB — MAGNESIUM: MAGNESIUM: 1.5 mg/dL — AB (ref 1.7–2.4)

## 2018-06-11 NOTE — Assessment & Plan Note (Signed)
She has chronic kidney disease stage III, stable She will continue risk factor modification

## 2018-06-11 NOTE — Assessment & Plan Note (Signed)
She is on oral replacement therapy I plan to recheck serum vitamin B12 in her next visit to see if this could be a contributing factor to her feeling unwell

## 2018-06-11 NOTE — Progress Notes (Signed)
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 We will continue modified dosing and I plan to modify her schedule further to get chemo every 3 weeks to allow blood count recovery She will continue G-CSF support after treatment Since we have modify her dosing, she has been transfusion independent and has no side effects from GCSF support or risk of infection She does not need transfusion today She will continue chemotherapy as scheduled I plan to repeat CT imaging in September for objective assessment of response to therapy  CKD (chronic kidney disease), stage III ([place]) She has chronic kidney disease stage III, stable She will continue risk factor modification  Hypomagnesemia She will continue oral magnesium replacement therapy 3 times a day as scheduled  Anemia due to antineoplastic chemotherapy This is likely due to recent treatment. The patient denies recent history of bleeding such as epistaxis, hematuria or hematochezia. She is asymptomatic from the anemia. I will observe for now.  She does not require transfusion now.  Drug-induced hypotension She has complaints of feeling unwell She is noted to be mildly hypotensive I recommend blood pressure monitoring twice a day and to consider discontinuation of blood pressure medication if her blood pressure remains low  B12 deficiency She is on oral replacement therapy I plan to recheck serum vitamin B12 in her next visit to see if this could be a contributing factor to her feeling unwell   Orders Placed This Encounter  Procedures  . CT ABDOMEN PELVIS W CONTRAST    Standing Status:   Future    Standing Expiration Date:   06/12/2019    Order  Specific Question:   If indicated for the ordered procedure, I authorize the administration of contrast media per Radiology protocol    Answer:   Yes    Order Specific Question:   Preferred imaging location?    Answer:   [facility]    Order Specific Question:   Radiology Contrast Protocol - do NOT remove file path    Answer:   [email or web address]  . CA 125    Standing Status:   Standing    Number of Occurrences:   9    Standing Expiration Date:   06/12/2019  . Vitamin B12    Standing Status:   Future    Standing Expiration Date:   07/16/2019    INTERVAL HISTORY: Please see below for problem oriented charting. She returns to be seen with her husband for further follow-up She felt unwell in her last visit but she could not really pinpoint exactly what she meant by that She denies nausea, changes in bowel habits or worsening peripheral neuropathy She felt that her blood sugar was well controlled I noted her blood pressure has been running low.  She did not check her blood pressure on a regular basis She takes furosemide once in a while for fluid retention She denies recent infection, fever, chills or cough.  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 This may potentially be ovarian or a GI in etiology.    04/30/2016 Tumor Marker    CA 125- 7149.0 (H)    05/01/2016 Procedure    US paracentesis- Successful ultrasound-guided paracentesis yielding 1.8 liters of peritoneal fluid.    05/01/2016 Imaging    US pelvis- Both transabdominal and transvaginal sonography are significantly limited by large patient habitus and ascites. Neither uterus or ovaries  were visualized on this exam.    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    05/08/2016 Imaging    CT chest- No evidence of metastatic disease in the chest. Peritoneal/omental disease with abdominal ascites in the upper abdomen, incompletely visualized.     05/13/2016 Procedure    Placement of single lumen port a cath via right internal jugular vein. The catheter tip lies at the cavoatrial junction. A power injectable port a cath was placed and is ready for immediate use.    05/15/2016 Procedure    US Paracentesis- 3400 ml yellow colored ascites removed    05/15/2016 - 09/18/2016 Chemotherapy    Carboplatin/Paclitaxel every 21 days x 7 cycles    07/01/2016 Miscellaneous    Genetic Counseling by [name]-  Genetic testing was normal, and did not reveal a deleterious mutation in these genes.     07/08/2016 Imaging    CT CAP- 1. Small volume ascites, significantly decreased. 2. Stable diffuse omental soft tissue caking and diffuse peritoneal thickening along the bilateral paracolic gutters and bilateral pelvic peritoneal reflections, consistent with peritoneal carcinomatosis. 3. Stable asymmetrically enlarged right ovary, which may represent the primary site of ovarian malignancy. 4. No evidence of metastatic disease in the chest. No new sites of metastatic disease in the abdomen or pelvis.    07/09/2016 Miscellaneous    Gyn Onc re-evaluation- modest response to therapy, 3 more cycles of chemotherapy recommended.      09/11/2016 Imaging    CT C/A/P No significant change omental soft tissue caking, consistent with metastatic disease. Mild ascites is decreased since previous study.  Increased calcification along peritoneal surface in pelvic cul-de-sac, consistent with treated peritoneal metastatic disease.  Stable 4.5cm homogeneous right pelvic mass, which favors a  uterine fibroid although right ovarian neoplasm cannot definitely be excluded.  No new or progressive metastatic disease identified. No evidence of metastatic disease within the thorax.     10/14/2016 Procedure    Robotic-assisted laparoscopic total hysterectomy with bilateral salpingoophorectomy, ex lap omentectomy, radical tumor debulking by Dr. Denman George    10/17/2016 Pathology Results    Diagnosis 1. 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 - CERVIX AND ENDOMETRIUM FREE OF TUMOR. - SEE ONCOLOGY TABLE AND COMMENT. 2. Soft tissue, biopsy, umbilical nodule - HIGH GRADE SEROUS CARCINOMA. 3. Omentum, resection for tumor - HIGH GRADE SEROUS CARCINOMA, 33 CM.    11/06/2016 - 12/25/2016 Chemotherapy    Carboplatin/Paclitaxel x 3 cycles     01/12/2017 Imaging    CT CAP- 1. Interval hysterectomy, bilateral salpingo-oophorectomy and omentectomy without evidence of tumor recurrence. 2. No evidence of metastatic disease. 3. 5 mm nonobstructing lower pole left renal calculus.    01/13/2017 Remission    No evidence of residual disease on CT imaging.    05/04/2017 Imaging    CT CAP- New small amount of ascites within the abdomen and pelvis since 01/12/2017  which could indicate disease progression but no new identifiable tumor and no significant change in omental and mild peritoneal thickening.  No evidence of metastatic disease within the chest.  Coronary artery disease.  Aortic Atherosclerosis (ICD10-I70.0).    05/04/2017 Tumor Marker    Patient's tumor was tested for the following markers: CA 125 Results of the tumor marker test revealed 8527    05/18/2017 Imaging    ECHO; EF 60% -  65    05/25/2017 - 07/07/2017 Chemotherapy    She received Doxil and Avastin. Treatment is stopped due to disease progression    06/01/2017 Procedure    Successful ultrasound-guided therapeutic  paracentesis yielding 2.8 liters of peritoneal fluid    06/22/2017 Tumor Marker    Patient's tumor was tested for the following markers: CA 125 Results of the tumor marker test revealed 13440    07/20/2017 Imaging    Mild increase in peritoneal carcinoma within abdomen pelvis since previous study. No significant change and minimal ascites.  No evidence of metastatic disease within the thorax. New mild airspace opacity in left lower lobe, consistent with inflammatory or infectious etiology.    07/30/2017 Tumor Marker    Patient's tumor was tested for the following markers: CA 125 Results of the tumor marker test revealed 12099    07/30/2017 -  Chemotherapy    The patient had gemzar. Cisplatin is added on 11/02/18    08/03/2017 - 08/05/2017 Hospital Admission    She was admitted to the hospital for management of UTI and neutropenic fever    08/13/2017 Adverse Reaction    Dose of chemotherapy is reduced due to neutropenic sepsis    08/24/2017 Tumor Marker    Patient's tumor was tested for the following markers: CA 125 Results of the tumor marker test revealed 7431    09/21/2017 Tumor Marker    Patient's tumor was tested for the following markers: CA 125 Results of the tumor marker test revealed 4176    10/09/2017 Imaging    No significant change in peritoneal carcinomatosis since previous study.  No new or progressive metastatic disease within the abdomen or pelvis.  Stable tiny nonobstructive left renal calculus. No evidence of ureteral calculi or hydronephrosis.    11/10/2017 Tumor Marker    Patient's tumor was tested for the following markers: CA 125 Results of the tumor marker test revealed 4257    11/24/2017 Tumor Marker    Patient's tumor was tested for the following markers: CA 125 Results of the tumor marker test revealed 3377    01/12/2018 Tumor Marker    Patient's tumor was tested for the following markers: CA 125 Results of the tumor marker test revealed 1595     01/21/2018 Imaging    1. Stable to slightly improved omental, gastrohepatic ligament and pelvic disease as detailed above. No new/progressive findings. 2. No acute abdominal or pelvic findings. 3. Stable lower pole left renal calculus.    02/08/2018 Tumor Marker    Patient's tumor was tested for the following markers: CA 125 Results of the tumor marker test revealed 1058    03/08/2018 Tumor Marker    Patient's tumor was tested for the following markers: CA-125 Results of the tumor marker test revealed 737.1    04/02/2018 Tumor Marker    Patient's tumor was tested for the following markers: CA-125 Results of the tumor marker test revealed 542.8     REVIEW OF SYSTEMS:   Constitutional: Denies fevers, chills or abnormal weight loss Eyes: Denies blurriness of  vision Ears, nose, mouth, throat, and face: Denies mucositis or sore throat Respiratory: Denies cough, dyspnea or wheezes Cardiovascular: Denies palpitation, chest discomfort or lower extremity swelling Gastrointestinal:  Denies nausea, heartburn or change in bowel habits Skin: Denies abnormal skin rashes Lymphatics: Denies new lymphadenopathy or easy bruising Neurological:Denies numbness, tingling or new weaknesses Behavioral/Psych: Mood is stable, no new changes  All other systems were reviewed with the patient and are negative.  I have reviewed the past medical history, past surgical history, social history and family history with the patient and they are unchanged from previous note.  ALLERGIES:  is allergic to avandia [rosiglitazone]; micronase [glyburide]; and actos [pioglitazone].  MEDICATIONS:  Current Outpatient Medications  Medication Sig Dispense Refill  . atorvastatin (LIPITOR) 40 MG tablet Take 1 tablet (40 mg total) by mouth daily. 90 tablet 3  . benazepril (LOTENSIN) 10 MG tablet Take 0.5 tablets (5 mg total) by mouth daily. 45 tablet 2  . Biotin 1000 MCG tablet Take 1,000 mcg by mouth 3 (three) times daily.    .  furosemide (LASIX) 20 MG tablet TAKE 1 TABLET(20 MG) BY MOUTH THREE TIMES DAILY 90 tablet 0  . gabapentin (NEURONTIN) 300 MG capsule Take 1 capsule (300 mg total) by mouth 3 (three) times daily. 90 capsule 11  . glucose blood (ONE TOUCH ULTRA TEST) test strip USE TO CHECK BLOOD SUGAR UP TO 5 TIMES A DAY 500 each 3  . HYDROcodone-acetaminophen (NORCO) 10-325 MG tablet Take 1 tablet by mouth every 4 (four) hours as needed. 90 tablet 0  . insulin aspart (NOVOLOG) 100 UNIT/ML injection USE IN INSULIN PUMP AS DIRECTED. MAX DAILY DOSE OF 110 UNITS PER DAY 110 mL 2  . lidocaine-prilocaine (EMLA) cream Apply a quarter size amount to port site 1 hour prior to chemo. Do not rub in. Cover with plastic wrap. 30 g 3  . loperamide (IMODIUM A-D) 2 MG tablet Take 1 tablet (2 mg total) by mouth 4 (four) times daily as needed for diarrhea or loose stools. 30 tablet 0  . loratadine (CLARITIN) 10 MG tablet Take 10 mg by mouth daily.    . magnesium oxide (MAG-OX) 400 (241.3 Mg) MG tablet Take 1 tablet (400 mg total) by mouth 3 (three) times daily. 90 tablet 3  . metFORMIN (GLUCOPHAGE) 1000 MG tablet TAKE 1 TABLET BY MOUTH TWICE DAILY WITH A MEAL 180 tablet 3  . Multiple Vitamin (MULTIVITAMIN WITH MINERALS) TABS Take 1 tablet by mouth daily.    . multivitamin-lutein (OCUVITE-LUTEIN) CAPS capsule Take 1 capsule by mouth daily.    Marland Kitchen omeprazole (PRILOSEC) 40 MG capsule Take 1 capsule (40 mg total) daily by mouth. 90 capsule 3  . ondansetron (ZOFRAN) 8 MG tablet Take 1 tablet (8 mg total) by mouth every 8 (eight) hours as needed for nausea or vomiting. 90 tablet 3  . polyethylene glycol (MIRALAX / GLYCOLAX) packet Take 17 g by mouth daily as needed for mild constipation.     . prochlorperazine (COMPAZINE) 10 MG tablet Take 1 tablet (10 mg total) by mouth every 6 (six) hours as needed for nausea or vomiting. 30 tablet 0  . senna (SENOKOT) 8.6 MG TABS tablet Take 1 tablet (8.6 mg total) by mouth at bedtime as needed for  moderate constipation. 120 each 0  . vitamin B-12 (CYANOCOBALAMIN) 1000 MCG tablet Take 1,000 mcg by mouth daily.    . vitamin E (VITAMIN E) 400 UNIT capsule Take 400 Units by mouth daily.     No  current facility-administered medications for this visit.     PHYSICAL EXAMINATION: ECOG PERFORMANCE STATUS: 1 - Symptomatic but completely ambulatory  Vitals:   06/11/18 0929  BP: (!) 117/43  Pulse: 88  Resp: 18  Temp: 98.1 F (36.7 C)  SpO2: 96%   Filed Weights   06/11/18 0929  Weight: 223 lb (101.2 kg)    GENERAL:alert, no distress and comfortable SKIN: skin color, texture, turgor are normal, no rashes or significant lesions EYES: normal, Conjunctiva are pink and non-injected, sclera clear OROPHARYNX:no exudate, no erythema and lips, buccal mucosa, and tongue normal  NECK: supple, thyroid normal size, non-tender, without nodularity LYMPH:  no palpable lymphadenopathy in the cervical, axillary or inguinal LUNGS: clear to auscultation and percussion with normal breathing effort HEART: regular rate & rhythm and no murmurs and no lower extremity edema ABDOMEN:abdomen soft, non-tender and normal bowel sounds Musculoskeletal:no cyanosis of digits and no clubbing  NEURO: alert & oriented x 3 with fluent speech, no focal motor/sensory deficits  LABORATORY DATA:  I have reviewed the data as listed    Component Value Date/Time   NA 140 06/11/2018 0851   NA 139 11/02/2017 0756   K 4.5 06/11/2018 0851   K 4.7 11/02/2017 0756   CL 102 06/11/2018 0851   CO2 25 06/11/2018 0851   CO2 26 11/02/2017 0756   GLUCOSE 101 (H) 06/11/2018 0851   GLUCOSE 140 11/02/2017 0756   BUN 31 (H) 06/11/2018 0851   BUN 23.7 11/02/2017 0756   CREATININE 1.24 (H) 06/11/2018 0851   CREATININE 0.9 11/02/2017 0756   CALCIUM 9.4 06/11/2018 0851   CALCIUM 9.4 11/02/2017 0756   PROT 6.8 06/11/2018 0851   PROT 7.0 11/02/2017 0756   ALBUMIN 3.9 06/11/2018 0851   ALBUMIN 3.5 11/02/2017 0756   AST 16  06/11/2018 0851   AST 18 11/02/2017 0756   ALT 14 06/11/2018 0851   ALT 23 11/02/2017 0756   ALKPHOS 130 (H) 06/11/2018 0851   ALKPHOS 108 11/02/2017 0756   BILITOT 0.3 06/11/2018 0851   BILITOT 0.24 11/02/2017 0756   GFRNONAA 45 (L) 06/11/2018 0851   GFRAA 52 (L) 06/11/2018 0851    No results found for: SPEP, UPEP  Lab Results  Component Value Date   WBC 7.5 06/11/2018   NEUTROABS 6.0 06/11/2018   HGB 10.0 (L) 06/11/2018   HCT 30.2 (L) 06/11/2018   MCV 109.4 (H) 06/11/2018   PLT 197 06/11/2018      Chemistry      Component Value Date/Time   NA 140 06/11/2018 0851   NA 139 11/02/2017 0756   K 4.5 06/11/2018 0851   K 4.7 11/02/2017 0756   CL 102 06/11/2018 0851   CO2 25 06/11/2018 0851   CO2 26 11/02/2017 0756   BUN 31 (H) 06/11/2018 0851   BUN 23.7 11/02/2017 0756   CREATININE 1.24 (H) 06/11/2018 0851   CREATININE 0.9 11/02/2017 0756      Component Value Date/Time   CALCIUM 9.4 06/11/2018 0851   CALCIUM 9.4 11/02/2017 0756   ALKPHOS 130 (H) 06/11/2018 0851   ALKPHOS 108 11/02/2017 0756   AST 16 06/11/2018 0851   AST 18 11/02/2017 0756   ALT 14 06/11/2018 0851   ALT 23 11/02/2017 0756   BILITOT 0.3 06/11/2018 0851   BILITOT 0.24 11/02/2017 0756     All questions were answered. The patient knows to call the clinic with any problems, questions or concerns. No barriers to learning was detected.  I spent 20  minutes counseling the patient face to face. The total time spent in the appointment was 25 minutes and more than 50% was on counseling and review of test results  Heath Lark, MD 06/11/2018 1:36 PM

## 2018-06-11 NOTE — Assessment & Plan Note (Signed)
She has complaints of feeling unwell She is noted to be mildly hypotensive I recommend blood pressure monitoring twice a day and to consider discontinuation of blood pressure medication if her blood pressure remains low

## 2018-06-11 NOTE — Assessment & Plan Note (Signed)
Overall, she is responding well to treatment since addition of cisplatin, although that was causing some issues with low blood count requiring transfusion. We will continue modified dosing and I plan to modify her schedule further to get chemo every 3 weeks to allow blood count recovery She will continue G-CSF support after treatment Since we have modify her dosing, she has been transfusion independent and has no side effects from GCSF support or risk of infection She does not need transfusion today She will continue chemotherapy as scheduled I plan to repeat CT imaging in September for objective assessment of response to therapy

## 2018-06-11 NOTE — Assessment & Plan Note (Signed)
She will continue oral magnesium replacement therapy 3 times a day as scheduled

## 2018-06-11 NOTE — Telephone Encounter (Signed)
Gave patient avs and calendar of upcoming appts.  °

## 2018-06-11 NOTE — Assessment & Plan Note (Signed)
This is likely due to recent treatment. The patient denies recent history of bleeding such as epistaxis, hematuria or hematochezia. She is asymptomatic from the anemia. I will observe for now.  She does not require transfusion now. 

## 2018-06-12 LAB — SAMPLE TO BLOOD BANK

## 2018-06-14 ENCOUNTER — Inpatient Hospital Stay: Payer: Medicare Other

## 2018-06-14 VITALS — BP 141/40 | HR 88 | Temp 98.0°F | Resp 18

## 2018-06-14 DIAGNOSIS — C578 Malignant neoplasm of overlapping sites of female genital organs: Secondary | ICD-10-CM | POA: Diagnosis not present

## 2018-06-14 DIAGNOSIS — C481 Malignant neoplasm of specified parts of peritoneum: Secondary | ICD-10-CM

## 2018-06-14 MED ORDER — HEPARIN SOD (PORK) LOCK FLUSH 100 UNIT/ML IV SOLN
500.0000 [IU] | Freq: Once | INTRAVENOUS | Status: AC | PRN
  Administered 2018-06-14: 500 [IU]
  Filled 2018-06-14: qty 5

## 2018-06-14 MED ORDER — SODIUM CHLORIDE 0.9 % IV SOLN
50.0000 mg | Freq: Once | INTRAVENOUS | Status: AC
  Administered 2018-06-14: 50 mg via INTRAVENOUS
  Filled 2018-06-14: qty 50

## 2018-06-14 MED ORDER — PEGFILGRASTIM 6 MG/0.6ML ~~LOC~~ PSKT
PREFILLED_SYRINGE | SUBCUTANEOUS | Status: AC
  Filled 2018-06-14: qty 0.6

## 2018-06-14 MED ORDER — PALONOSETRON HCL INJECTION 0.25 MG/5ML
INTRAVENOUS | Status: AC
  Filled 2018-06-14: qty 5

## 2018-06-14 MED ORDER — SODIUM CHLORIDE 0.9 % IV SOLN
500.0000 mg/m2 | Freq: Once | INTRAVENOUS | Status: AC
  Administered 2018-06-14: 1026 mg via INTRAVENOUS
  Filled 2018-06-14: qty 26.98

## 2018-06-14 MED ORDER — PALONOSETRON HCL INJECTION 0.25 MG/5ML
0.2500 mg | Freq: Once | INTRAVENOUS | Status: AC
  Administered 2018-06-14: 0.25 mg via INTRAVENOUS

## 2018-06-14 MED ORDER — PEGFILGRASTIM 6 MG/0.6ML ~~LOC~~ PSKT
6.0000 mg | PREFILLED_SYRINGE | Freq: Once | SUBCUTANEOUS | Status: AC
  Administered 2018-06-14: 6 mg via SUBCUTANEOUS

## 2018-06-14 MED ORDER — SODIUM CHLORIDE 0.9 % IV SOLN
Freq: Once | INTRAVENOUS | Status: AC
  Administered 2018-06-14: 12:00:00 via INTRAVENOUS
  Filled 2018-06-14: qty 250

## 2018-06-14 MED ORDER — POTASSIUM CHLORIDE 2 MEQ/ML IV SOLN
Freq: Once | INTRAVENOUS | Status: AC
  Administered 2018-06-14: 10:00:00 via INTRAVENOUS
  Filled 2018-06-14: qty 10

## 2018-06-14 MED ORDER — FOSAPREPITANT DIMEGLUMINE INJECTION 150 MG
Freq: Once | INTRAVENOUS | Status: AC
  Administered 2018-06-14: 12:00:00 via INTRAVENOUS
  Filled 2018-06-14: qty 5

## 2018-06-14 MED ORDER — SODIUM CHLORIDE 0.9% FLUSH
10.0000 mL | INTRAVENOUS | Status: DC | PRN
  Administered 2018-06-14: 10 mL
  Filled 2018-06-14: qty 10

## 2018-06-14 NOTE — Patient Instructions (Signed)
[facility] Discharge Instructions for Patients Receiving Chemotherapy  Today you received the following chemotherapy agents :  Gemcitabine and Cisplatin.  To help prevent nausea and vomiting after your treatment, we encourage you to take your nausea medication as prescribed.   If you develop nausea and vomiting that is not controlled by your nausea medication, call the clinic.   BELOW ARE SYMPTOMS THAT SHOULD BE REPORTED IMMEDIATELY:  *FEVER GREATER THAN 100.5 F  *CHILLS WITH OR WITHOUT FEVER  NAUSEA AND VOMITING THAT IS NOT CONTROLLED WITH YOUR NAUSEA MEDICATION  *UNUSUAL SHORTNESS OF BREATH  *UNUSUAL BRUISING OR BLEEDING  TENDERNESS IN MOUTH AND THROAT WITH OR WITHOUT PRESENCE OF ULCERS  *URINARY PROBLEMS  *BOWEL PROBLEMS  UNUSUAL RASH Items with * indicate a potential emergency and should be followed up as soon as possible.  Feel free to call the clinic should you have any questions or concerns. The clinic phone number is [phone].  Please show the [facility] at check-in to the Emergency Department and triage nurse.

## 2018-06-15 LAB — CA 125: CANCER ANTIGEN (CA) 125: 1010 U/mL — AB (ref 0.0–38.1)

## 2018-06-16 ENCOUNTER — Encounter: Payer: Self-pay | Admitting: Gynecologic Oncology

## 2018-06-16 ENCOUNTER — Telehealth: Payer: Self-pay

## 2018-06-16 ENCOUNTER — Telehealth: Payer: Self-pay | Admitting: Gynecologic Oncology

## 2018-06-16 NOTE — Telephone Encounter (Signed)
She called and left a message that she got the message. Requesting CA 125 results be given to her. She left a loud message insisting that it was her CA 125 results and she would be given the results in person.    Above message given to Joylene John, NP. She will call patient.

## 2018-06-16 NOTE — Telephone Encounter (Signed)
Returned call to patient.  Informed of CA 125 results.  Copy of results sent to patient in [place] as well.  Advised that her results would be available to Dr. Calton Dach review when she returns.  No concerns voiced.

## 2018-06-16 NOTE — Telephone Encounter (Signed)
She called and left a message to call her with CA 125 results.  Called back and left a message that Dr. Alvy Bimler was out of the office and will be back Monday 8/19. I am unable to give her results. The office will call her Monday with results.

## 2018-06-19 ENCOUNTER — Other Ambulatory Visit: Payer: Self-pay | Admitting: Pediatrics

## 2018-06-19 DIAGNOSIS — E785 Hyperlipidemia, unspecified: Secondary | ICD-10-CM

## 2018-06-21 ENCOUNTER — Telehealth: Payer: Self-pay

## 2018-06-21 ENCOUNTER — Telehealth: Payer: Self-pay | Admitting: Internal Medicine

## 2018-06-21 MED ORDER — INSULIN LISPRO 100 UNIT/ML ~~LOC~~ SOLN: SUBCUTANEOUS | 0 refills | Status: DC

## 2018-06-21 NOTE — Telephone Encounter (Signed)
Sent!

## 2018-06-21 NOTE — Telephone Encounter (Signed)
Medicare-[employer] will not cover Novalog but they will cover Humalog. Please send RX for Humalog to [facility] in [place] ph# [phone].

## 2018-06-21 NOTE — Telephone Encounter (Signed)
Called and given below message. She verbalized understanding. 

## 2018-06-21 NOTE — Telephone Encounter (Signed)
-----   Message from Heath Lark, MD sent at 06/21/2018  8:42 AM EDT ----- Regarding: CA-125 Let her know CA-125 is high Waiting for CT to be scheduled to find out what is going on ----- Message ----- From: Interface, Lab In [place] Sent: 06/15/2018   7:37 AM EDT To: Heath Lark, MD

## 2018-06-30 ENCOUNTER — Ambulatory Visit (HOSPITAL_COMMUNITY)
Admission: RE | Admit: 2018-06-30 | Discharge: 2018-06-30 | Disposition: A | Discharge status: Home / Self Care | Payer: Medicare Other | Source: Ambulatory Visit | Attending: Hematology and Oncology | Admitting: Hematology and Oncology

## 2018-06-30 DIAGNOSIS — C481 Malignant neoplasm of specified parts of peritoneum: Secondary | ICD-10-CM | POA: Insufficient documentation

## 2018-06-30 DIAGNOSIS — R188 Other ascites: Secondary | ICD-10-CM | POA: Insufficient documentation

## 2018-06-30 DIAGNOSIS — I700 Atherosclerosis of aorta: Secondary | ICD-10-CM | POA: Diagnosis not present

## 2018-06-30 MED ORDER — HEPARIN SOD (PORK) LOCK FLUSH 100 UNIT/ML IV SOLN
INTRAVENOUS | Status: AC
  Filled 2018-06-30: qty 5

## 2018-06-30 MED ORDER — HEPARIN SOD (PORK) LOCK FLUSH 100 UNIT/ML IV SOLN
500.0000 [IU] | Freq: Once | INTRAVENOUS | Status: AC
  Administered 2018-06-30: 500 [IU]

## 2018-06-30 MED ORDER — IOHEXOL 300 MG/ML  SOLN
100.0000 mL | Freq: Once | INTRAMUSCULAR | Status: AC | PRN
  Administered 2018-06-30: 80 mL via INTRAVENOUS

## 2018-07-01 ENCOUNTER — Telehealth: Payer: Self-pay | Admitting: Hematology and Oncology

## 2018-07-01 ENCOUNTER — Inpatient Hospital Stay: Payer: Medicare Other

## 2018-07-01 ENCOUNTER — Inpatient Hospital Stay: Payer: Medicare Other | Admitting: Hematology and Oncology

## 2018-07-01 DIAGNOSIS — C481 Malignant neoplasm of specified parts of peritoneum: Secondary | ICD-10-CM

## 2018-07-01 DIAGNOSIS — C569 Malignant neoplasm of unspecified ovary: Secondary | ICD-10-CM

## 2018-07-01 DIAGNOSIS — R180 Malignant ascites: Secondary | ICD-10-CM | POA: Diagnosis not present

## 2018-07-01 DIAGNOSIS — Z90710 Acquired absence of both cervix and uterus: Secondary | ICD-10-CM

## 2018-07-01 DIAGNOSIS — C786 Secondary malignant neoplasm of retroperitoneum and peritoneum: Secondary | ICD-10-CM

## 2018-07-01 DIAGNOSIS — C578 Malignant neoplasm of overlapping sites of female genital organs: Secondary | ICD-10-CM | POA: Diagnosis not present

## 2018-07-01 DIAGNOSIS — Z7189 Other specified counseling: Secondary | ICD-10-CM

## 2018-07-01 DIAGNOSIS — Z79899 Other long term (current) drug therapy: Secondary | ICD-10-CM

## 2018-07-01 DIAGNOSIS — E538 Deficiency of other specified B group vitamins: Secondary | ICD-10-CM

## 2018-07-01 DIAGNOSIS — Z90722 Acquired absence of ovaries, bilateral: Secondary | ICD-10-CM

## 2018-07-01 LAB — COMPREHENSIVE METABOLIC PANEL
ALBUMIN: 3.7 g/dL (ref 3.5–5.0)
ALK PHOS: 137 U/L — AB (ref 38–126)
ALT: 9 U/L (ref 0–44)
ANION GAP: 11 (ref 5–15)
AST: 15 U/L (ref 15–41)
BUN: 26 mg/dL — ABNORMAL HIGH (ref 8–23)
CALCIUM: 9.4 mg/dL (ref 8.9–10.3)
CO2: 27 mmol/L (ref 22–32)
Chloride: 102 mmol/L (ref 98–111)
Creatinine, Ser: 1.29 mg/dL — ABNORMAL HIGH (ref 0.44–1.00)
GFR calc Af Amer: 49 mL/min — ABNORMAL LOW (ref >60)
GFR calc non Af Amer: 42 mL/min — ABNORMAL LOW (ref >60)
GLUCOSE: 92 mg/dL (ref 70–99)
POTASSIUM: 5.1 mmol/L (ref 3.5–5.1)
Sodium: 140 mmol/L (ref 135–145)
TOTAL PROTEIN: 6.5 g/dL (ref 6.5–8.1)
Total Bilirubin: <0.2 mg/dL — ABNORMAL LOW (ref 0.3–1.2)

## 2018-07-01 LAB — CBC WITH DIFFERENTIAL/PLATELET
BASOS PCT: 1 %
Basophils Absolute: 0.1 10*3/uL (ref 0.0–0.1)
Eosinophils Absolute: 0.2 10*3/uL (ref 0.0–0.5)
Eosinophils Relative: 3 %
HEMATOCRIT: 28.1 % — AB (ref 34.8–46.6)
HEMOGLOBIN: 9.3 g/dL — AB (ref 11.6–15.9)
Lymphocytes Relative: 5 %
Lymphs Abs: 0.4 10*3/uL — ABNORMAL LOW (ref 0.9–3.3)
MCH: 35.9 pg — AB (ref 25.1–34.0)
MCHC: 33.2 g/dL (ref 31.5–36.0)
MCV: 108.1 fL — AB (ref 79.5–101.0)
MONO ABS: 0.8 10*3/uL (ref 0.1–0.9)
MONOS PCT: 10 %
NEUTROS ABS: 6.6 10*3/uL — AB (ref 1.5–6.5)
NEUTROS PCT: 81 %
Platelets: 141 10*3/uL — ABNORMAL LOW (ref 145–400)
RBC: 2.6 MIL/uL — ABNORMAL LOW (ref 3.70–5.45)
RDW: 16.2 % — AB (ref 11.2–14.5)
WBC: 8.1 10*3/uL (ref 3.9–10.3)

## 2018-07-01 LAB — MAGNESIUM: Magnesium: 1.5 mg/dL — ABNORMAL LOW (ref 1.7–2.4)

## 2018-07-01 LAB — VITAMIN B12: VITAMIN B 12: 4883 pg/mL — AB (ref 180–914)

## 2018-07-01 MED ORDER — HYDROCODONE-ACETAMINOPHEN 10-325 MG PO TABS: 1.0000 | ORAL_TABLET | ORAL | 0 refills | Status: DC | PRN

## 2018-07-01 MED FILL — HYDROCODON-APAP 10-325: 10-325 | 15 days supply | Qty: 90 | Fill #0

## 2018-07-01 NOTE — Telephone Encounter (Signed)
Gave patient avs.  Patient did not need calendar.  °

## 2018-07-02 ENCOUNTER — Ambulatory Visit: Payer: Medicare Other

## 2018-07-02 ENCOUNTER — Encounter: Payer: Self-pay | Admitting: Hematology and Oncology

## 2018-07-02 DIAGNOSIS — R180 Malignant ascites: Secondary | ICD-10-CM | POA: Insufficient documentation

## 2018-07-02 LAB — CA 125: CANCER ANTIGEN (CA) 125: 2005 U/mL — AB (ref 0.0–38.1)

## 2018-07-02 NOTE — Assessment & Plan Note (Signed)
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 We had an honest discussion about expected benefits from future treatment Typically, expected benefits are less than 20% regardless of choices of treatment, with the expense of poor quality of life She did not progress on paclitaxel and has never received Taxotere Potentially, if the patient is interested for further palliative chemotherapy, those are reasonable options We also discussed potential referral for her to meet with her GYN oncologist or tertiary referral elsewhere for second opinion We also discussed potential enrollment in palliative care/hospice The patient is very tearful She is undecided After prolonged discussion, she is interested to return next week for further discussion about treatment options and goals of care I will see her next week I have cancelled all her future chemotherapy treatment

## 2018-07-02 NOTE — Assessment & Plan Note (Signed)
We had multiple goals of care discussion throughout last year The patient understood that any form of future treatment is strictly palliative in nature We discussed poor prognosis which I suspect would be less than a year, potentially less than 6 months if she chooses not to undergo palliative treatment Any form of future treatment is unlikely going to bring significant benefit with estimated response rate less than 20% with progression free survival in a few months time The patient is very tearful We discussed importance of advanced directives and living will and I plan to meet with her and her husband again next week for further discussion

## 2018-07-02 NOTE — Progress Notes (Signed)
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 We had an honest discussion about expected benefits from future treatment Typically, expected benefits are less than 20% regardless of choices of treatment, with the expense of poor quality of life She did not progress on paclitaxel and has never received Taxotere Potentially, if the patient is interested for further palliative chemotherapy, those are reasonable options We also discussed potential referral for her to meet with her GYN oncologist or tertiary referral elsewhere for second opinion We also discussed potential enrollment in palliative care/hospice The patient is very tearful She is undecided After prolonged discussion, she is interested to return next week for further discussion about treatment options and goals of care I will see her next week I have cancelled all her future chemotherapy treatment  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 Currently, she is not symptomatic Recommend close observation only  Goals of care, counseling/discussion We had multiple goals of care discussion throughout last year The patient understood that any form of future treatment is strictly palliative in nature We discussed poor prognosis which I suspect would be less than a year, potentially less than 6 months if she chooses not to undergo palliative treatment Any form of future treatment is unlikely going to bring significant benefit with estimated response rate less than 20%  with progression free survival in a few months time The patient is very tearful We discussed importance of advanced directives and living will and I plan to meet with her and her husband again next week for further discussion   No orders of the defined types were placed in this encounter.   INTERVAL HISTORY: Please see below for problem oriented charting. She returns with her husband for further review of test results Since last time I saw her, she has gained some weight which she thought is related to abdominal swelling She denies nausea No recent changes in bowel habits.  SUMMARY OF ONCOLOGIC HISTORY: Oncology History   Negative genetic testing ER 80% PR 5% positive, Her 2 neu negative     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[facility])   04/29/2016 Imaging    CT abd/pelvis- Extensive omental caking as well as moderate amount of ascites within the abdomen most compatible with peritoneal metastatic disease, of unknown primary. This may potentially be ovarian or a GI in etiology.    04/30/2016 Tumor Marker    CA 125- 7149.0 (H)    05/01/2016 Procedure    US paracentesis- Successful ultrasound-guided paracentesis yielding 1.8 liters of peritoneal fluid.    05/01/2016 Imaging    US pelvis- Both transabdominal and transvaginal sonography are significantly limited by large patient habitus and ascites. Neither uterus or ovaries were visualized on this exam.    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    05/08/2016 Imaging    CT chest- No evidence of metastatic disease in the chest. Peritoneal/omental disease with abdominal ascites in the upper abdomen, incompletely visualized.     05/13/2016 Procedure    Placement of single lumen  port a cath via right internal jugular vein. The catheter tip lies at the cavoatrial junction. A power injectable port a cath was placed  and is ready for immediate use.    05/15/2016 Procedure    US Paracentesis- 3400 ml yellow colored ascites removed    05/15/2016 - 09/18/2016 Chemotherapy    Carboplatin/Paclitaxel every 21 days x 7 cycles    07/01/2016 Miscellaneous    Genetic Counseling by [name]-  Genetic testing was normal, and did not reveal a deleterious mutation in these genes.     07/08/2016 Imaging    CT CAP- 1. Small volume ascites, significantly decreased. 2. Stable diffuse omental soft tissue caking and diffuse peritoneal thickening along the bilateral paracolic gutters and bilateral pelvic peritoneal reflections, consistent with peritoneal carcinomatosis. 3. Stable asymmetrically enlarged right ovary, which may represent the primary site of ovarian malignancy. 4. No evidence of metastatic disease in the chest. No new sites of metastatic disease in the abdomen or pelvis.    07/09/2016 Miscellaneous    Gyn Onc re-evaluation- modest response to therapy, 3 more cycles of chemotherapy recommended.      09/11/2016 Imaging    CT C/A/P No significant change omental soft tissue caking, consistent with metastatic disease. Mild ascites is decreased since previous study.  Increased calcification along peritoneal surface in pelvic cul-de-sac, consistent with treated peritoneal metastatic disease.  Stable 4.5cm homogeneous right pelvic mass, which favors a uterine fibroid although right ovarian neoplasm cannot definitely be excluded.  No new or progressive metastatic disease identified. No evidence of metastatic disease within the thorax.     10/14/2016 Procedure    Robotic-assisted laparoscopic total hysterectomy with bilateral salpingoophorectomy, ex lap omentectomy, radical tumor debulking by Dr. Denman George    10/17/2016 Pathology Results    Diagnosis 1. 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 - CERVIX AND  ENDOMETRIUM FREE OF TUMOR. - SEE ONCOLOGY TABLE AND COMMENT. 2. Soft tissue, biopsy, umbilical nodule - HIGH GRADE SEROUS CARCINOMA. 3. Omentum, resection for tumor - HIGH GRADE SEROUS CARCINOMA, 33 CM.    11/06/2016 - 12/25/2016 Chemotherapy    Carboplatin/Paclitaxel x 3 cycles     01/12/2017 Imaging    CT CAP- 1. Interval hysterectomy, bilateral salpingo-oophorectomy and omentectomy without evidence of tumor recurrence. 2. No evidence of metastatic disease. 3. 5 mm nonobstructing lower pole left renal calculus.    01/13/2017 Remission    No evidence of residual disease on CT imaging.    05/04/2017 Imaging    CT CAP- New small amount of ascites within the abdomen and pelvis since 01/12/2017 which could indicate disease progression but no new identifiable tumor and no significant change in omental and mild peritoneal thickening.  No evidence of metastatic disease within the chest.  Coronary artery disease.  Aortic Atherosclerosis (ICD10-I70.0).    05/04/2017 Tumor Marker    Patient's tumor was tested for the following markers: CA 125 Results of the tumor marker test revealed 1962    05/18/2017 Imaging    ECHO; EF 60% -  65    05/25/2017 - 07/07/2017 Chemotherapy    She received Doxil and Avastin. Treatment is stopped due to disease progression    06/01/2017 Procedure    Successful ultrasound-guided therapeutic paracentesis yielding 2.8 liters of peritoneal fluid    06/22/2017 Tumor Marker    Patient's tumor was tested for the following markers: CA 125 Results of the tumor marker test revealed 22979  07/20/2017 Imaging    Mild increase in peritoneal carcinoma within abdomen pelvis since previous study. No significant change and minimal ascites.  No evidence of metastatic disease within the thorax. New mild airspace opacity in left lower lobe, consistent with inflammatory or infectious etiology.    07/30/2017 Tumor Marker    Patient's tumor was tested for the following markers:  CA 125 Results of the tumor marker test revealed 12099    07/30/2017 - 06/14/2018 Chemotherapy    The patient had gemzar. Cisplatin is added on 11/02/18    08/03/2017 - 08/05/2017 Hospital Admission    She was admitted to the hospital for management of UTI and neutropenic fever    08/13/2017 Adverse Reaction    Dose of chemotherapy is reduced due to neutropenic sepsis    08/24/2017 Tumor Marker    Patient's tumor was tested for the following markers: CA 125 Results of the tumor marker test revealed 7431    09/21/2017 Tumor Marker    Patient's tumor was tested for the following markers: CA 125 Results of the tumor marker test revealed 4176    10/09/2017 Imaging    No significant change in peritoneal carcinomatosis since previous study.  No new or progressive metastatic disease within the abdomen or pelvis.  Stable tiny nonobstructive left renal calculus. No evidence of ureteral calculi or hydronephrosis.    11/10/2017 Tumor Marker    Patient's tumor was tested for the following markers: CA 125 Results of the tumor marker test revealed 4257    11/24/2017 Tumor Marker    Patient's tumor was tested for the following markers: CA 125 Results of the tumor marker test revealed 3377    01/12/2018 Tumor Marker    Patient's tumor was tested for the following markers: CA 125 Results of the tumor marker test revealed 1595    01/21/2018 Imaging    1. Stable to slightly improved omental, gastrohepatic ligament and pelvic disease as detailed above. No new/progressive findings. 2. No acute abdominal or pelvic findings. 3. Stable lower pole left renal calculus.    02/08/2018 Tumor Marker    Patient's tumor was tested for the following markers: CA 125 Results of the tumor marker test revealed 1058    03/08/2018 Tumor Marker    Patient's tumor was tested for the following markers: CA-125 Results of the tumor marker test revealed 737.1    04/02/2018 Tumor Marker    Patient's tumor was tested for the  following markers: CA-125 Results of the tumor marker test revealed 542.8    06/14/2018 Tumor Marker    CA 125- 1010    06/30/2018 Imaging    1. Mixed appearance but overall progression, with the left upper quadrant omental tumor slightly improved but with some potential new deposition of tumor along the hepatic flexure margin. There is also new moderate ascites which must be considered suspicious for malignant ascites/peritoneal spread. 2. Other imaging findings of potential clinical significance: Nonobstructive left nephrolithiasis. Aortic Atherosclerosis (ICD10-I70.0). Mild subcutaneous edema along the sixth anterior abdominal wall potentially from mild panniculitis.     REVIEW OF SYSTEMS:   Constitutional: Denies fevers, chills or abnormal weight loss Eyes: Denies blurriness of vision Ears, nose, mouth, throat, and face: Denies mucositis or sore throat Respiratory: Denies cough, dyspnea or wheezes Cardiovascular: Denies palpitation, chest discomfort or lower extremity swelling Gastrointestinal:  Denies nausea, heartburn or change in bowel habits Skin: Denies abnormal skin rashes Lymphatics: Denies new lymphadenopathy or easy bruising Neurological:Denies numbness, tingling or new weaknesses Behavioral/Psych: Mood is  stable, no new changes  All other systems were reviewed with the patient and are negative.  I have reviewed the past medical history, past surgical history, social history and family history with the patient and they are unchanged from previous note.  ALLERGIES:  is allergic to avandia [rosiglitazone]; micronase [glyburide]; and actos [pioglitazone].  MEDICATIONS:  Current Outpatient Medications  Medication Sig Dispense Refill  . atorvastatin (LIPITOR) 40 MG tablet Take 1 tablet (40 mg total) by mouth daily. 90 tablet 3  . atorvastatin (LIPITOR) 40 MG tablet TAKE 1 TABLET BY MOUTH DAILY 90 tablet 0  . benazepril (LOTENSIN) 10 MG tablet Take 0.5 tablets (5 mg total) by  mouth daily. 45 tablet 2  . Biotin 1000 MCG tablet Take 1,000 mcg by mouth 3 (three) times daily.    . furosemide (LASIX) 20 MG tablet TAKE 1 TABLET(20 MG) BY MOUTH THREE TIMES DAILY 90 tablet 0  . gabapentin (NEURONTIN) 300 MG capsule Take 1 capsule (300 mg total) by mouth 3 (three) times daily. 90 capsule 11  . glucose blood (ONE TOUCH ULTRA TEST) test strip USE TO CHECK BLOOD SUGAR UP TO 5 TIMES A DAY 500 each 3  . HYDROcodone-acetaminophen (NORCO) 10-325 MG tablet Take 1 tablet by mouth every 4 (four) hours as needed. 90 tablet 0  . insulin lispro (HUMALOG) 100 UNIT/ML injection USE IN INSULIN PUMP AS DIRECTED. MAX DAILY DOSE OF 110 UNITS PER DAY 110 mL 0  . lidocaine-prilocaine (EMLA) cream Apply a quarter size amount to port site 1 hour prior to chemo. Do not rub in. Cover with plastic wrap. 30 g 3  . loperamide (IMODIUM A-D) 2 MG tablet Take 1 tablet (2 mg total) by mouth 4 (four) times daily as needed for diarrhea or loose stools. 30 tablet 0  . loratadine (CLARITIN) 10 MG tablet Take 10 mg by mouth daily.    . magnesium oxide (MAG-OX) 400 (241.3 Mg) MG tablet Take 1 tablet (400 mg total) by mouth 3 (three) times daily. 90 tablet 3  . metFORMIN (GLUCOPHAGE) 1000 MG tablet TAKE 1 TABLET BY MOUTH TWICE DAILY WITH A MEAL 180 tablet 3  . Multiple Vitamin (MULTIVITAMIN WITH MINERALS) TABS Take 1 tablet by mouth daily.    . multivitamin-lutein (OCUVITE-LUTEIN) CAPS capsule Take 1 capsule by mouth daily.    Marland Kitchen omeprazole (PRILOSEC) 40 MG capsule Take 1 capsule (40 mg total) daily by mouth. 90 capsule 3  . ondansetron (ZOFRAN) 8 MG tablet Take 1 tablet (8 mg total) by mouth every 8 (eight) hours as needed for nausea or vomiting. 90 tablet 3  . polyethylene glycol (MIRALAX / GLYCOLAX) packet Take 17 g by mouth daily as needed for mild constipation.     . prochlorperazine (COMPAZINE) 10 MG tablet Take 1 tablet (10 mg total) by mouth every 6 (six) hours as needed for nausea or vomiting. 30 tablet 0  .  senna (SENOKOT) 8.6 MG TABS tablet Take 1 tablet (8.6 mg total) by mouth at bedtime as needed for moderate constipation. 120 each 0  . vitamin B-12 (CYANOCOBALAMIN) 1000 MCG tablet Take 1,000 mcg by mouth daily.    . vitamin E (VITAMIN E) 400 UNIT capsule Take 400 Units by mouth daily.     No current facility-administered medications for this visit.     PHYSICAL EXAMINATION: ECOG PERFORMANCE STATUS: 1 - Symptomatic but completely ambulatory  Vitals:   07/01/18 0952  BP: (!) 132/52  Pulse: 73  Resp: 18  Temp: 98 F (36.7  C)  SpO2: 100%   Filed Weights   07/01/18 0952  Weight: 229 lb 9.6 oz (104.1 kg)    GENERAL:alert, no distress and comfortable Musculoskeletal:no cyanosis of digits and no clubbing  NEURO: alert & oriented x 3 with fluent speech, no focal motor/sensory deficits  LABORATORY DATA:  I have reviewed the data as listed    Component Value Date/Time   NA 140 07/01/2018 0906   NA 139 11/02/2017 0756   K 5.1 07/01/2018 0906   K 4.7 11/02/2017 0756   CL 102 07/01/2018 0906   CO2 27 07/01/2018 0906   CO2 26 11/02/2017 0756   GLUCOSE 92 07/01/2018 0906   GLUCOSE 140 11/02/2017 0756   BUN 26 (H) 07/01/2018 0906   BUN 23.7 11/02/2017 0756   CREATININE 1.29 (H) 07/01/2018 0906   CREATININE 0.9 11/02/2017 0756   CALCIUM 9.4 07/01/2018 0906   CALCIUM 9.4 11/02/2017 0756   PROT 6.5 07/01/2018 0906   PROT 7.0 11/02/2017 0756   ALBUMIN 3.7 07/01/2018 0906   ALBUMIN 3.5 11/02/2017 0756   AST 15 07/01/2018 0906   AST 18 11/02/2017 0756   ALT 9 07/01/2018 0906   ALT 23 11/02/2017 0756   ALKPHOS 137 (H) 07/01/2018 0906   ALKPHOS 108 11/02/2017 0756   BILITOT <0.2 (L) 07/01/2018 0906   BILITOT 0.24 11/02/2017 0756   GFRNONAA 42 (L) 07/01/2018 0906   GFRAA 49 (L) 07/01/2018 0906    No results found for: SPEP, UPEP  Lab Results  Component Value Date   WBC 8.1 07/01/2018   NEUTROABS 6.6 (H) 07/01/2018   HGB 9.3 (L) 07/01/2018   HCT 28.1 (L) 07/01/2018   MCV  108.1 (H) 07/01/2018   PLT 141 (L) 07/01/2018      Chemistry      Component Value Date/Time   NA 140 07/01/2018 0906   NA 139 11/02/2017 0756   K 5.1 07/01/2018 0906   K 4.7 11/02/2017 0756   CL 102 07/01/2018 0906   CO2 27 07/01/2018 0906   CO2 26 11/02/2017 0756   BUN 26 (H) 07/01/2018 0906   BUN 23.7 11/02/2017 0756   CREATININE 1.29 (H) 07/01/2018 0906   CREATININE 0.9 11/02/2017 0756      Component Value Date/Time   CALCIUM 9.4 07/01/2018 0906   CALCIUM 9.4 11/02/2017 0756   ALKPHOS 137 (H) 07/01/2018 0906   ALKPHOS 108 11/02/2017 0756   AST 15 07/01/2018 0906   AST 18 11/02/2017 0756   ALT 9 07/01/2018 0906   ALT 23 11/02/2017 0756   BILITOT <0.2 (L) 07/01/2018 0906   BILITOT 0.24 11/02/2017 0756       RADIOGRAPHIC STUDIES: I have reviewed multiple imaging study with the patient and family I have personally reviewed the radiological images as listed and agreed with the findings in the report. Ct Abdomen Pelvis W Contrast  Result Date: 07/01/2018 CLINICAL DATA:  Restaging of ovarian cancer EXAM: CT ABDOMEN AND PELVIS WITH CONTRAST TECHNIQUE: Multidetector CT imaging of the abdomen and pelvis was performed using the standard protocol following bolus administration of intravenous contrast. CONTRAST:  31mL OMNIPAQUE IOHEXOL 300 MG/ML  SOLN COMPARISON:  01/21/2018 FINDINGS: Lower chest: Unremarkable Hepatobiliary: Linear calcifications along the upper margin of the caudate lobe similar to prior. Gallbladder unremarkable. No well-defined intraparenchymal mass. No biliary dilatation. Pancreas: Unremarkable Spleen: Punctate calcifications indicating old granulomatous disease. Adrenals/Urinary Tract: Adrenal glands normal. 0.4 cm left kidney lower pole nonobstructive renal calculus. Stomach/Bowel: On image 34/5 there is a somewhat  masslike appearance within or along the hepatic flexure of the colon. This may simply be due to a confluence of fluid as there is no mass in this  vicinity the prior exam the tumor deposition along the colon wall is also a possibility. Vascular/Lymphatic: Aortoiliac atherosclerotic vascular disease. No retroperitoneal or pelvic adenopathy. Reproductive: The uterus and ovaries are absent. Other: Moderate amount of perihepatic and perisplenic ascites also tracking along the paracolic gutters, in the pelvis, along the loops of bowel as well as the gastric margins. Left upper quadrant band of omental tumor 1.8 by 3.7 cm, formerly 1.7 by 4.0 cm. Gastrohepatic ligament tumor nodularity in right upper quadrant tumor nodularity are roughly stable compared to previous exam. Mild subcutaneous inflammation along the pannus. Musculoskeletal: Postoperative findings in the lumbar spine, not changed. IMPRESSION: 1. Mixed appearance but overall progression, with the left upper quadrant omental tumor slightly improved but with some potential new deposition of tumor along the hepatic flexure margin. There is also new moderate ascites which must be considered suspicious for malignant ascites/peritoneal spread. 2. Other imaging findings of potential clinical significance: Nonobstructive left nephrolithiasis. Aortic Atherosclerosis (ICD10-I70.0). Mild subcutaneous edema along the sixth anterior abdominal wall potentially from mild panniculitis. Electronically Signed   By: Van Clines M.D.   On: 07/01/2018 07:30    All questions were answered. The patient knows to call the clinic with any problems, questions or concerns. No barriers to learning was detected.  I spent 40 minutes counseling the patient face to face. The total time spent in the appointment was 55 minutes and more than 50% was on counseling and review of test results  Heath Lark, MD 07/02/2018 7:45 AM

## 2018-07-02 NOTE — Assessment & Plan Note (Signed)
Imaging studies reviewed recurrence of malignant ascites Currently, she is not symptomatic Recommend close observation only

## 2018-07-07 ENCOUNTER — Telehealth: Payer: Self-pay | Admitting: *Deleted

## 2018-07-07 ENCOUNTER — Inpatient Hospital Stay: Payer: Medicare Other | Attending: Hematology and Oncology | Admitting: Hematology and Oncology

## 2018-07-07 ENCOUNTER — Encounter: Payer: Self-pay | Admitting: Hematology and Oncology

## 2018-07-07 VITALS — BP 166/56 | HR 103 | Temp 98.4°F | Resp 17 | Ht 63.0 in | Wt 227.8 lb

## 2018-07-07 DIAGNOSIS — Z90710 Acquired absence of both cervix and uterus: Secondary | ICD-10-CM | POA: Diagnosis not present

## 2018-07-07 DIAGNOSIS — Z7189 Other specified counseling: Secondary | ICD-10-CM

## 2018-07-07 DIAGNOSIS — R600 Localized edema: Secondary | ICD-10-CM

## 2018-07-07 DIAGNOSIS — N183 Chronic kidney disease, stage 3 (moderate): Secondary | ICD-10-CM | POA: Insufficient documentation

## 2018-07-07 DIAGNOSIS — Z7689 Persons encountering health services in other specified circumstances: Secondary | ICD-10-CM | POA: Diagnosis not present

## 2018-07-07 DIAGNOSIS — Z90722 Acquired absence of ovaries, bilateral: Secondary | ICD-10-CM | POA: Diagnosis not present

## 2018-07-07 DIAGNOSIS — E1122 Type 2 diabetes mellitus with diabetic chronic kidney disease: Secondary | ICD-10-CM | POA: Diagnosis not present

## 2018-07-07 DIAGNOSIS — Z79899 Other long term (current) drug therapy: Secondary | ICD-10-CM

## 2018-07-07 DIAGNOSIS — C481 Malignant neoplasm of specified parts of peritoneum: Secondary | ICD-10-CM

## 2018-07-07 DIAGNOSIS — N189 Chronic kidney disease, unspecified: Secondary | ICD-10-CM

## 2018-07-07 DIAGNOSIS — I700 Atherosclerosis of aorta: Secondary | ICD-10-CM

## 2018-07-07 DIAGNOSIS — Z5111 Encounter for antineoplastic chemotherapy: Secondary | ICD-10-CM | POA: Insufficient documentation

## 2018-07-07 DIAGNOSIS — G620 Drug-induced polyneuropathy: Secondary | ICD-10-CM | POA: Insufficient documentation

## 2018-07-07 DIAGNOSIS — R180 Malignant ascites: Secondary | ICD-10-CM | POA: Diagnosis not present

## 2018-07-07 DIAGNOSIS — C578 Malignant neoplasm of overlapping sites of female genital organs: Secondary | ICD-10-CM

## 2018-07-07 DIAGNOSIS — C786 Secondary malignant neoplasm of retroperitoneum and peritoneum: Secondary | ICD-10-CM

## 2018-07-07 DIAGNOSIS — Z794 Long term (current) use of insulin: Secondary | ICD-10-CM

## 2018-07-07 DIAGNOSIS — D6481 Anemia due to antineoplastic chemotherapy: Secondary | ICD-10-CM | POA: Diagnosis not present

## 2018-07-07 DIAGNOSIS — I2510 Atherosclerotic heart disease of native coronary artery without angina pectoris: Secondary | ICD-10-CM | POA: Diagnosis not present

## 2018-07-07 DIAGNOSIS — T451X5S Adverse effect of antineoplastic and immunosuppressive drugs, sequela: Secondary | ICD-10-CM | POA: Diagnosis not present

## 2018-07-07 DIAGNOSIS — C569 Malignant neoplasm of unspecified ovary: Secondary | ICD-10-CM

## 2018-07-07 MED ORDER — FUROSEMIDE 40 MG PO TABS: 40.0000 mg | ORAL_TABLET | Freq: Two times a day (BID) | ORAL | 9 refills | Status: DC

## 2018-07-07 NOTE — Progress Notes (Signed)
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[facility]) We had extensive discussion about treatment options We discussed the role of palliative chemotherapy with either Taxotere or weekly Taxol versus antiestrogen therapy Due to her current reasonable performance status, I recommend consideration of either Taxotere every 3 weeks of weekly Taxol We also discussed potential referral back to [place] for treatment locally She is undecided Her husband will call me in 2 days for further discussion about plan of care The risk, benefits, side effects of Taxol and Taxotere were discussed including risk of pancytopenia, hair loss, nausea, leg swelling and neuropathy  Malignant ascites She has recurrent malignant ascites She desired to have paracentesis locally I will try to order urgent paracentesis locally I will keep maximum fluid removal at 5 L  Edema leg She has chronic bilateral lower extremity edema likely due to fluid retention from chronic kidney disease and other causes I recommend furosemide twice a day I refilled her prescription  Goals of care, counseling/discussion We had extensive discussion about goals of care She understood that treatment goals are strictly palliative in nature The patient is not willing to accept palliative care/hospice yet and desire further treatment    Orders Placed This Encounter  Procedures  . US Paracentesis    Standing Status:   Future    Standing Expiration Date:   07/07/2019    Order Specific Question:   If therapeutic, is there a maximum amount of fluid to be removed?    Answer:   Yes    Comments:   5 liters    Order Specific Question:   What is the maximum amount of fluide to be removed?    Answer:   5 liters    Order Specific Question:   Are labs required for specimen collection?    Answer:    No    Order Specific Question:   Is Albumin medication needed?    Answer:   No    Order Specific Question:   Reason for Exam (SYMPTOM  OR DIAGNOSIS REQUIRED)    Answer:   maligant ascites    Order Specific Question:   Preferred imaging location?    Answer:   [facility]    INTERVAL HISTORY: Please see below for problem oriented charting. She returns with her husband for further follow-up She complained of worsening abdominal bloating and leg swelling She denies worsening abdominal pain She is tearful and undecided about treatment options She is concerned about side effects of chemotherapy and is wondering about side effects of antiestrogen therapy  SUMMARY OF ONCOLOGIC HISTORY: Oncology History   Negative genetic testing ER 80% PR 5% positive, Her 2 neu negative     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[place])   04/29/2016 Imaging    CT abd/pelvis- Extensive omental caking as well as moderate amount of ascites within the abdomen most compatible with peritoneal metastatic disease, of unknown primary. This may potentially be ovarian or a GI in etiology.    04/30/2016 Tumor Marker    CA 125- 7149.0 (H)    05/01/2016 Procedure    US paracentesis- Successful ultrasound-guided paracentesis yielding 1.8 liters of peritoneal fluid.    05/01/2016 Imaging    US pelvis- Both transabdominal and transvaginal sonography are significantly limited by large patient habitus and ascites. Neither uterus or ovaries were visualized on this exam.  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    05/08/2016 Imaging    CT chest- No evidence of metastatic disease in the chest. Peritoneal/omental disease with abdominal ascites in the upper abdomen, incompletely visualized.     05/13/2016 Procedure    Placement of single lumen port a cath via right internal jugular vein. The  catheter tip lies at the cavoatrial junction. A power injectable port a cath was placed and is ready for immediate use.    05/15/2016 Procedure    US Paracentesis- 3400 ml yellow colored ascites removed    05/15/2016 - 09/18/2016 Chemotherapy    Carboplatin/Paclitaxel every 21 days x 7 cycles    07/01/2016 Miscellaneous    Genetic Counseling by [name]-  Genetic testing was normal, and did not reveal a deleterious mutation in these genes.     07/08/2016 Imaging    CT CAP- 1. Small volume ascites, significantly decreased. 2. Stable diffuse omental soft tissue caking and diffuse peritoneal thickening along the bilateral paracolic gutters and bilateral pelvic peritoneal reflections, consistent with peritoneal carcinomatosis. 3. Stable asymmetrically enlarged right ovary, which may represent the primary site of ovarian malignancy. 4. No evidence of metastatic disease in the chest. No new sites of metastatic disease in the abdomen or pelvis.    07/09/2016 Miscellaneous    Gyn Onc re-evaluation- modest response to therapy, 3 more cycles of chemotherapy recommended.      09/11/2016 Imaging    CT C/A/P No significant change omental soft tissue caking, consistent with metastatic disease. Mild ascites is decreased since previous study.  Increased calcification along peritoneal surface in pelvic cul-de-sac, consistent with treated peritoneal metastatic disease.  Stable 4.5cm homogeneous right pelvic mass, which favors a uterine fibroid although right ovarian neoplasm cannot definitely be excluded.  No new or progressive metastatic disease identified. No evidence of metastatic disease within the thorax.     10/14/2016 Procedure    Robotic-assisted laparoscopic total hysterectomy with bilateral salpingoophorectomy, ex lap omentectomy, radical tumor debulking by Dr. Denman George    10/17/2016 Pathology Results    Diagnosis 1. Uterus +/- tubes/ovaries, neoplastic - HIGH GRADE SEROUS CARCINOMA  INVOLVING SEROSA OF UTERUS, BILATERAL FALLOPIAN TUBES AND BILATERAL OVARIES. - CERVIX AND ENDOMETRIUM FREE OF TUMOR. - SEE ONCOLOGY TABLE AND COMMENT. 2. Soft tissue, biopsy, umbilical nodule - HIGH GRADE SEROUS CARCINOMA. 3. Omentum, resection for tumor - HIGH GRADE SEROUS CARCINOMA, 33 CM.    11/06/2016 - 12/25/2016 Chemotherapy    Carboplatin/Paclitaxel x 3 cycles     01/12/2017 Imaging    CT CAP- 1. Interval hysterectomy, bilateral salpingo-oophorectomy and omentectomy without evidence of tumor recurrence. 2. No evidence of metastatic disease. 3. 5 mm nonobstructing lower pole left renal calculus.    01/13/2017 Remission    No evidence of residual disease on CT imaging.    05/04/2017 Imaging    CT CAP- New small amount of ascites within the abdomen and pelvis since 01/12/2017 which could indicate disease progression but no new identifiable tumor and no significant change in omental and mild peritoneal thickening.  No evidence of metastatic disease within the chest.  Coronary artery disease.  Aortic Atherosclerosis (ICD10-I70.0).    05/04/2017 Tumor Marker    Patient's tumor was tested for the following markers: CA 125 Results of the tumor marker test revealed 1633    05/18/2017 Imaging    ECHO; EF 60% -  65    05/25/2017 -  07/07/2017 Chemotherapy    She received Doxil and Avastin. Treatment is stopped due to disease progression    06/01/2017 Procedure    Successful ultrasound-guided therapeutic paracentesis yielding 2.8 liters of peritoneal fluid    06/22/2017 Tumor Marker    Patient's tumor was tested for the following markers: CA 125 Results of the tumor marker test revealed 13440    07/20/2017 Imaging    Mild increase in peritoneal carcinoma within abdomen pelvis since previous study. No significant change and minimal ascites.  No evidence of metastatic disease within the thorax. New mild airspace opacity in left lower lobe, consistent with inflammatory or infectious  etiology.    07/30/2017 Tumor Marker    Patient's tumor was tested for the following markers: CA 125 Results of the tumor marker test revealed 12099    07/30/2017 - 06/14/2018 Chemotherapy    The patient had gemzar. Cisplatin is added on 11/02/18    08/03/2017 - 08/05/2017 Hospital Admission    She was admitted to the hospital for management of UTI and neutropenic fever    08/13/2017 Adverse Reaction    Dose of chemotherapy is reduced due to neutropenic sepsis    08/24/2017 Tumor Marker    Patient's tumor was tested for the following markers: CA 125 Results of the tumor marker test revealed 7431    09/21/2017 Tumor Marker    Patient's tumor was tested for the following markers: CA 125 Results of the tumor marker test revealed 4176    10/09/2017 Imaging    No significant change in peritoneal carcinomatosis since previous study.  No new or progressive metastatic disease within the abdomen or pelvis.  Stable tiny nonobstructive left renal calculus. No evidence of ureteral calculi or hydronephrosis.    11/10/2017 Tumor Marker    Patient's tumor was tested for the following markers: CA 125 Results of the tumor marker test revealed 4257    11/24/2017 Tumor Marker    Patient's tumor was tested for the following markers: CA 125 Results of the tumor marker test revealed 3377    01/12/2018 Tumor Marker    Patient's tumor was tested for the following markers: CA 125 Results of the tumor marker test revealed 1595    01/21/2018 Imaging    1. Stable to slightly improved omental, gastrohepatic ligament and pelvic disease as detailed above. No new/progressive findings. 2. No acute abdominal or pelvic findings. 3. Stable lower pole left renal calculus.    02/08/2018 Tumor Marker    Patient's tumor was tested for the following markers: CA 125 Results of the tumor marker test revealed 1058    03/08/2018 Tumor Marker    Patient's tumor was tested for the following markers: CA-125 Results of the  tumor marker test revealed 737.1    04/02/2018 Tumor Marker    Patient's tumor was tested for the following markers: CA-125 Results of the tumor marker test revealed 542.8    06/14/2018 Tumor Marker    CA 125- 1010    06/30/2018 Imaging    1. Mixed appearance but overall progression, with the left upper quadrant omental tumor slightly improved but with some potential new deposition of tumor along the hepatic flexure margin. There is also new moderate ascites which must be considered suspicious for malignant ascites/peritoneal spread. 2. Other imaging findings of potential clinical significance: Nonobstructive left nephrolithiasis. Aortic Atherosclerosis (ICD10-I70.0). Mild subcutaneous edema along the sixth anterior abdominal wall potentially from mild panniculitis.     REVIEW OF SYSTEMS:   Constitutional: Denies fevers, chills or  abnormal weight loss Eyes: Denies blurriness of vision Ears, nose, mouth, throat, and face: Denies mucositis or sore throat Respiratory: Denies cough, dyspnea or wheezes Cardiovascular: Denies palpitation, chest discomfort Gastrointestinal:  Denies nausea, heartburn or change in bowel habits Skin: Denies abnormal skin rashes Lymphatics: Denies new lymphadenopathy or easy bruising Neurological:Denies numbness, tingling or new weaknesses Behavioral/Psych: Mood is stable, no new changes  All other systems were reviewed with the patient and are negative.  I have reviewed the past medical history, past surgical history, social history and family history with the patient and they are unchanged from previous note.  ALLERGIES:  is allergic to avandia [rosiglitazone]; micronase [glyburide]; and actos [pioglitazone].  MEDICATIONS:  Current Outpatient Medications  Medication Sig Dispense Refill  . atorvastatin (LIPITOR) 40 MG tablet Take 1 tablet (40 mg total) by mouth daily. 90 tablet 3  . benazepril (LOTENSIN) 10 MG tablet Take 0.5 tablets (5 mg total) by mouth  daily. 45 tablet 2  . Biotin 1000 MCG tablet Take 1,000 mcg by mouth 3 (three) times daily.    . furosemide (LASIX) 40 MG tablet Take 1 tablet (40 mg total) by mouth 2 (two) times daily. 60 tablet 9  . gabapentin (NEURONTIN) 300 MG capsule Take 1 capsule (300 mg total) by mouth 3 (three) times daily. 90 capsule 11  . glucose blood (ONE TOUCH ULTRA TEST) test strip USE TO CHECK BLOOD SUGAR UP TO 5 TIMES A DAY 500 each 3  . HYDROcodone-acetaminophen (NORCO) 10-325 MG tablet Take 1 tablet by mouth every 4 (four) hours as needed. 90 tablet 0  . insulin lispro (HUMALOG) 100 UNIT/ML injection USE IN INSULIN PUMP AS DIRECTED. MAX DAILY DOSE OF 110 UNITS PER DAY 110 mL 0  . lidocaine-prilocaine (EMLA) cream Apply a quarter size amount to port site 1 hour prior to chemo. Do not rub in. Cover with plastic wrap. 30 g 3  . loperamide (IMODIUM A-D) 2 MG tablet Take 1 tablet (2 mg total) by mouth 4 (four) times daily as needed for diarrhea or loose stools. 30 tablet 0  . loratadine (CLARITIN) 10 MG tablet Take 10 mg by mouth daily.    . magnesium oxide (MAG-OX) 400 (241.3 Mg) MG tablet Take 1 tablet (400 mg total) by mouth 3 (three) times daily. 90 tablet 3  . metFORMIN (GLUCOPHAGE) 1000 MG tablet TAKE 1 TABLET BY MOUTH TWICE DAILY WITH A MEAL 180 tablet 3  . Multiple Vitamin (MULTIVITAMIN WITH MINERALS) TABS Take 1 tablet by mouth daily.    . multivitamin-lutein (OCUVITE-LUTEIN) CAPS capsule Take 1 capsule by mouth daily.    Marland Kitchen omeprazole (PRILOSEC) 40 MG capsule Take 1 capsule (40 mg total) daily by mouth. 90 capsule 3  . ondansetron (ZOFRAN) 8 MG tablet Take 1 tablet (8 mg total) by mouth every 8 (eight) hours as needed for nausea or vomiting. 90 tablet 3  . polyethylene glycol (MIRALAX / GLYCOLAX) packet Take 17 g by mouth daily as needed for mild constipation.     . prochlorperazine (COMPAZINE) 10 MG tablet Take 1 tablet (10 mg total) by mouth every 6 (six) hours as needed for nausea or vomiting. 30 tablet 0   . senna (SENOKOT) 8.6 MG TABS tablet Take 1 tablet (8.6 mg total) by mouth at bedtime as needed for moderate constipation. 120 each 0  . vitamin B-12 (CYANOCOBALAMIN) 1000 MCG tablet Take 1,000 mcg by mouth daily.    . vitamin E (VITAMIN E) 400 UNIT capsule Take 400 Units by mouth  daily.     No current facility-administered medications for this visit.     PHYSICAL EXAMINATION: ECOG PERFORMANCE STATUS: 2 - Symptomatic, <50% confined to bed  Vitals:   07/07/18 1102  BP: (!) 166/56  Pulse: (!) 103  Resp: 17  Temp: 98.4 F (36.9 C)  SpO2: 93%   Filed Weights   07/07/18 1102  Weight: 227 lb 12.8 oz (103.3 kg)    GENERAL:alert, no distress and comfortable HEART:noted mild bilateral lower extremity edema NEURO: alert & oriented x 3 with fluent speech, no focal motor/sensory deficits.  She is tearful  LABORATORY DATA:  I have reviewed the data as listed    Component Value Date/Time   NA 140 07/01/2018 0906   NA 139 11/02/2017 0756   K 5.1 07/01/2018 0906   K 4.7 11/02/2017 0756   CL 102 07/01/2018 0906   CO2 27 07/01/2018 0906   CO2 26 11/02/2017 0756   GLUCOSE 92 07/01/2018 0906   GLUCOSE 140 11/02/2017 0756   BUN 26 (H) 07/01/2018 0906   BUN 23.7 11/02/2017 0756   CREATININE 1.29 (H) 07/01/2018 0906   CREATININE 0.9 11/02/2017 0756   CALCIUM 9.4 07/01/2018 0906   CALCIUM 9.4 11/02/2017 0756   PROT 6.5 07/01/2018 0906   PROT 7.0 11/02/2017 0756   ALBUMIN 3.7 07/01/2018 0906   ALBUMIN 3.5 11/02/2017 0756   AST 15 07/01/2018 0906   AST 18 11/02/2017 0756   ALT 9 07/01/2018 0906   ALT 23 11/02/2017 0756   ALKPHOS 137 (H) 07/01/2018 0906   ALKPHOS 108 11/02/2017 0756   BILITOT <0.2 (L) 07/01/2018 0906   BILITOT 0.24 11/02/2017 0756   GFRNONAA 42 (L) 07/01/2018 0906   GFRAA 49 (L) 07/01/2018 0906    No results found for: SPEP, UPEP  Lab Results  Component Value Date   WBC 8.1 07/01/2018   NEUTROABS 6.6 (H) 07/01/2018   HGB 9.3 (L) 07/01/2018   HCT 28.1 (L)  07/01/2018   MCV 108.1 (H) 07/01/2018   PLT 141 (L) 07/01/2018      Chemistry      Component Value Date/Time   NA 140 07/01/2018 0906   NA 139 11/02/2017 0756   K 5.1 07/01/2018 0906   K 4.7 11/02/2017 0756   CL 102 07/01/2018 0906   CO2 27 07/01/2018 0906   CO2 26 11/02/2017 0756   BUN 26 (H) 07/01/2018 0906   BUN 23.7 11/02/2017 0756   CREATININE 1.29 (H) 07/01/2018 0906   CREATININE 0.9 11/02/2017 0756      Component Value Date/Time   CALCIUM 9.4 07/01/2018 0906   CALCIUM 9.4 11/02/2017 0756   ALKPHOS 137 (H) 07/01/2018 0906   ALKPHOS 108 11/02/2017 0756   AST 15 07/01/2018 0906   AST 18 11/02/2017 0756   ALT 9 07/01/2018 0906   ALT 23 11/02/2017 0756   BILITOT <0.2 (L) 07/01/2018 0906   BILITOT 0.24 11/02/2017 0756       RADIOGRAPHIC STUDIES: I have personally reviewed the radiological images as listed and agreed with the findings in the report. Ct Abdomen Pelvis W Contrast  Result Date: 07/01/2018 CLINICAL DATA:  Restaging of ovarian cancer EXAM: CT ABDOMEN AND PELVIS WITH CONTRAST TECHNIQUE: Multidetector CT imaging of the abdomen and pelvis was performed using the standard protocol following bolus administration of intravenous contrast. CONTRAST:  6mL OMNIPAQUE IOHEXOL 300 MG/ML  SOLN COMPARISON:  01/21/2018 FINDINGS: Lower chest: Unremarkable Hepatobiliary: Linear calcifications along the upper margin of the caudate lobe similar to  prior. Gallbladder unremarkable. No well-defined intraparenchymal mass. No biliary dilatation. Pancreas: Unremarkable Spleen: Punctate calcifications indicating old granulomatous disease. Adrenals/Urinary Tract: Adrenal glands normal. 0.4 cm left kidney lower pole nonobstructive renal calculus. Stomach/Bowel: On image 34/5 there is a somewhat masslike appearance within or along the hepatic flexure of the colon. This may simply be due to a confluence of fluid as there is no mass in this vicinity the prior exam the tumor deposition along the  colon wall is also a possibility. Vascular/Lymphatic: Aortoiliac atherosclerotic vascular disease. No retroperitoneal or pelvic adenopathy. Reproductive: The uterus and ovaries are absent. Other: Moderate amount of perihepatic and perisplenic ascites also tracking along the paracolic gutters, in the pelvis, along the loops of bowel as well as the gastric margins. Left upper quadrant band of omental tumor 1.8 by 3.7 cm, formerly 1.7 by 4.0 cm. Gastrohepatic ligament tumor nodularity in right upper quadrant tumor nodularity are roughly stable compared to previous exam. Mild subcutaneous inflammation along the pannus. Musculoskeletal: Postoperative findings in the lumbar spine, not changed. IMPRESSION: 1. Mixed appearance but overall progression, with the left upper quadrant omental tumor slightly improved but with some potential new deposition of tumor along the hepatic flexure margin. There is also new moderate ascites which must be considered suspicious for malignant ascites/peritoneal spread. 2. Other imaging findings of potential clinical significance: Nonobstructive left nephrolithiasis. Aortic Atherosclerosis (ICD10-I70.0). Mild subcutaneous edema along the sixth anterior abdominal wall potentially from mild panniculitis. Electronically Signed   By: Van Clines M.D.   On: 07/01/2018 07:30    All questions were answered. The patient knows to call the clinic with any problems, questions or concerns. No barriers to learning was detected.  I spent 40 minutes counseling the patient face to face. The total time spent in the appointment was 55 minutes and more than 50% was on counseling and review of test results  Heath Lark, MD 07/07/2018 11:42 AM

## 2018-07-07 NOTE — Assessment & Plan Note (Signed)
We had extensive discussion about treatment options We discussed the role of palliative chemotherapy with either Taxotere or weekly Taxol versus antiestrogen therapy Due to her current reasonable performance status, I recommend consideration of either Taxotere every 3 weeks of weekly Taxol We also discussed potential referral back to [place] for treatment locally She is undecided Her husband will call me in 2 days for further discussion about plan of care The risk, benefits, side effects of Taxol and Taxotere were discussed including risk of pancytopenia, hair loss, nausea, leg swelling and neuropathy

## 2018-07-07 NOTE — Assessment & Plan Note (Signed)
She has recurrent malignant ascites She desired to have paracentesis locally I will try to order urgent paracentesis locally I will keep maximum fluid removal at 5 L

## 2018-07-07 NOTE — Assessment & Plan Note (Signed)
We had extensive discussion about goals of care She understood that treatment goals are strictly palliative in nature The patient is not willing to accept palliative care/hospice yet and desire further treatment

## 2018-07-07 NOTE — Assessment & Plan Note (Signed)
She has chronic bilateral lower extremity edema likely due to fluid retention from chronic kidney disease and other causes I recommend furosemide twice a day I refilled her prescription

## 2018-07-07 NOTE — Telephone Encounter (Signed)
Called pt & informed of US/paracentesis scheduled for tomorrow at [place] informed to be there @ 1:15 pm.  Pt expressed understanding.

## 2018-07-08 ENCOUNTER — Telehealth: Payer: Self-pay

## 2018-07-08 ENCOUNTER — Ambulatory Visit (HOSPITAL_COMMUNITY)
Admission: RE | Admit: 2018-07-08 | Discharge: 2018-07-08 | Disposition: A | Discharge status: Home / Self Care | Payer: Medicare Other | Source: Ambulatory Visit | Attending: Hematology and Oncology | Admitting: Hematology and Oncology

## 2018-07-08 ENCOUNTER — Other Ambulatory Visit: Payer: Self-pay | Admitting: Hematology and Oncology

## 2018-07-08 DIAGNOSIS — C481 Malignant neoplasm of specified parts of peritoneum: Secondary | ICD-10-CM

## 2018-07-08 DIAGNOSIS — R180 Malignant ascites: Secondary | ICD-10-CM | POA: Diagnosis present

## 2018-07-08 DIAGNOSIS — C569 Malignant neoplasm of unspecified ovary: Secondary | ICD-10-CM | POA: Insufficient documentation

## 2018-07-08 MED ORDER — DEXAMETHASONE 4 MG PO TABS: 8.0000 mg | ORAL_TABLET | Freq: Two times a day (BID) | ORAL | 1 refills | Status: DC

## 2018-07-08 NOTE — Telephone Encounter (Signed)
Spoke with pt by phone.  Explained that Dr Alvy Bimler wants her to start chemo on 07/16/18.  Scheduling dept will call her to schedule lab, flush, MD, and infusion appt.  And an appt for growth factor injection on 9/16. A prescription for dexamethasone will be sent to her pharmacy by Dr Alvy Bimler.  Pt instructed to take medication 2 times a day on the day before chemo (Thursday) and the 2 days after chemo (Saturday and Sunday).  Also told pt to make sure to take second dose around 3pm so it does not keep her awake at night.   Pt verbalizes understanding of these instructions.

## 2018-07-08 NOTE — Progress Notes (Signed)
Paracentesis complete no signs of distress.  

## 2018-07-08 NOTE — Procedures (Signed)
PreOperative Dx: Ovarian cancer, malignant ascites Postoperative Dx: Ovarian cancer, malignant ascites Procedure:   US guided paracentesis Radiologist:  Thornton Papas Anesthesia:  10 ml of1% lidocaine Specimen:  4 L of yellow ascitic fluid EBL:   < 1 ml Complications: None

## 2018-07-08 NOTE — Progress Notes (Signed)
DISCONTINUE OFF PATHWAY REGIMEN - Ovarian   [id number]:Cisplatin 25 mg/m2 D1,8 + Gemcitabine 1,000 mg/m2 D1,8 q21 Days:   A cycle is every 21 days:     Gemcitabine      Cisplatin   **Always confirm dose/schedule in your pharmacy ordering system**  REASON: Disease Progression PRIOR TREATMENT: Off Pathway: Cisplatin 25 mg/m2 D1,8 + Gemcitabine 1,000 mg/m2 D1,8 q21 Days TREATMENT RESPONSE: Progressive Disease (PD)  START OFF PATHWAY REGIMEN - Ovarian   OFF00006:Docetaxel (100 mg/m2):   A cycle is every 21 days:     Docetaxel   **Always confirm dose/schedule in your pharmacy ordering system**  Patient Characteristics: Recurrent or Progressive Disease, Third Line Therapy, Platinum Resistant or < 6 Months Since Last Platinum Therapy Therapeutic Status: Recurrent or Progressive Disease BRCA Mutation Status: Absent Line of Therapy: Third Line  Intent of Therapy: Non-Curative / Palliative Intent, Discussed with Patient

## 2018-07-12 ENCOUNTER — Telehealth: Payer: Self-pay | Admitting: Hematology and Oncology

## 2018-07-12 ENCOUNTER — Telehealth: Payer: Self-pay

## 2018-07-12 NOTE — Telephone Encounter (Signed)
Called patient to verify her added appointment for infusion. Per 9/9 follow up from [place]. Patient has MyChart

## 2018-07-12 NOTE — Telephone Encounter (Signed)
Spoke with patient regarding appointments added per 9/5 & 9/9 sch msg

## 2018-07-15 ENCOUNTER — Telehealth: Payer: Self-pay | Admitting: Hematology and Oncology

## 2018-07-15 ENCOUNTER — Encounter: Payer: Self-pay | Admitting: Hematology and Oncology

## 2018-07-15 NOTE — Telephone Encounter (Signed)
Pt appt canceled per 9/11 sch message. Pt aware.

## 2018-07-16 ENCOUNTER — Inpatient Hospital Stay: Payer: Medicare Other

## 2018-07-16 ENCOUNTER — Telehealth: Payer: Self-pay | Admitting: Hematology and Oncology

## 2018-07-16 ENCOUNTER — Ambulatory Visit: Payer: Medicare Other | Admitting: Hematology and Oncology

## 2018-07-16 ENCOUNTER — Other Ambulatory Visit: Payer: Medicare Other

## 2018-07-16 ENCOUNTER — Encounter: Payer: Self-pay | Admitting: Hematology and Oncology

## 2018-07-16 ENCOUNTER — Inpatient Hospital Stay: Payer: Medicare Other | Admitting: Hematology and Oncology

## 2018-07-16 VITALS — BP 139/63 | HR 70 | Temp 98.1°F | Resp 16

## 2018-07-16 DIAGNOSIS — E1122 Type 2 diabetes mellitus with diabetic chronic kidney disease: Secondary | ICD-10-CM

## 2018-07-16 DIAGNOSIS — E118 Type 2 diabetes mellitus with unspecified complications: Secondary | ICD-10-CM

## 2018-07-16 DIAGNOSIS — R180 Malignant ascites: Secondary | ICD-10-CM

## 2018-07-16 DIAGNOSIS — C578 Malignant neoplasm of overlapping sites of female genital organs: Secondary | ICD-10-CM | POA: Diagnosis not present

## 2018-07-16 DIAGNOSIS — D6481 Anemia due to antineoplastic chemotherapy: Secondary | ICD-10-CM

## 2018-07-16 DIAGNOSIS — Z7189 Other specified counseling: Secondary | ICD-10-CM

## 2018-07-16 DIAGNOSIS — G620 Drug-induced polyneuropathy: Secondary | ICD-10-CM

## 2018-07-16 DIAGNOSIS — Z79899 Other long term (current) drug therapy: Secondary | ICD-10-CM

## 2018-07-16 DIAGNOSIS — C481 Malignant neoplasm of specified parts of peritoneum: Secondary | ICD-10-CM

## 2018-07-16 DIAGNOSIS — Z90710 Acquired absence of both cervix and uterus: Secondary | ICD-10-CM

## 2018-07-16 DIAGNOSIS — C786 Secondary malignant neoplasm of retroperitoneum and peritoneum: Secondary | ICD-10-CM

## 2018-07-16 DIAGNOSIS — Z794 Long term (current) use of insulin: Secondary | ICD-10-CM

## 2018-07-16 DIAGNOSIS — N183 Chronic kidney disease, stage 3 unspecified: Secondary | ICD-10-CM

## 2018-07-16 DIAGNOSIS — T451X5A Adverse effect of antineoplastic and immunosuppressive drugs, initial encounter: Secondary | ICD-10-CM

## 2018-07-16 DIAGNOSIS — C569 Malignant neoplasm of unspecified ovary: Secondary | ICD-10-CM

## 2018-07-16 DIAGNOSIS — Z90722 Acquired absence of ovaries, bilateral: Secondary | ICD-10-CM

## 2018-07-16 DIAGNOSIS — I700 Atherosclerosis of aorta: Secondary | ICD-10-CM

## 2018-07-16 DIAGNOSIS — Z9641 Presence of insulin pump (external) (internal): Secondary | ICD-10-CM

## 2018-07-16 DIAGNOSIS — T451X5S Adverse effect of antineoplastic and immunosuppressive drugs, sequela: Secondary | ICD-10-CM

## 2018-07-16 LAB — MAGNESIUM: Magnesium: 1.9 mg/dL (ref 1.7–2.4)

## 2018-07-16 LAB — CBC WITH DIFFERENTIAL/PLATELET
BASOS ABS: 0 10*3/uL (ref 0.0–0.1)
Basophils Relative: 0 %
EOS PCT: 0 %
Eosinophils Absolute: 0 10*3/uL (ref 0.0–0.5)
HEMATOCRIT: 28.9 % — AB (ref 34.8–46.6)
Hemoglobin: 9.1 g/dL — ABNORMAL LOW (ref 11.6–15.9)
LYMPHS ABS: 0.3 10*3/uL — AB (ref 0.9–3.3)
LYMPHS PCT: 3 %
MCH: 34.2 pg — AB (ref 25.1–34.0)
MCHC: 31.5 g/dL (ref 31.5–36.0)
MCV: 108.6 fL — AB (ref 79.5–101.0)
MONO ABS: 0.5 10*3/uL (ref 0.1–0.9)
Monocytes Relative: 5 %
NEUTROS ABS: 8.6 10*3/uL — AB (ref 1.5–6.5)
Neutrophils Relative %: 92 %
Platelets: 194 10*3/uL (ref 145–400)
RBC: 2.66 MIL/uL — AB (ref 3.70–5.45)
RDW: 14.4 % (ref 11.2–14.5)
WBC: 9.4 10*3/uL (ref 3.9–10.3)

## 2018-07-16 LAB — COMPREHENSIVE METABOLIC PANEL
ALT: 12 U/L (ref 0–44)
AST: 13 U/L — AB (ref 15–41)
Albumin: 3.6 g/dL (ref 3.5–5.0)
Alkaline Phosphatase: 103 U/L (ref 38–126)
Anion gap: 15 (ref 5–15)
BUN: 33 mg/dL — AB (ref 8–23)
CHLORIDE: 104 mmol/L (ref 98–111)
CO2: 24 mmol/L (ref 22–32)
Calcium: 10 mg/dL (ref 8.9–10.3)
Creatinine, Ser: 1.28 mg/dL — ABNORMAL HIGH (ref 0.44–1.00)
GFR calc Af Amer: 50 mL/min — ABNORMAL LOW (ref >60)
GFR, EST NON AFRICAN AMERICAN: 43 mL/min — AB (ref >60)
GLUCOSE: 171 mg/dL — AB (ref 70–99)
POTASSIUM: 4.9 mmol/L (ref 3.5–5.1)
Sodium: 143 mmol/L (ref 135–145)
TOTAL PROTEIN: 6.7 g/dL (ref 6.5–8.1)
Total Bilirubin: 0.3 mg/dL (ref 0.3–1.2)

## 2018-07-16 MED ORDER — DEXAMETHASONE SODIUM PHOSPHATE 10 MG/ML IJ SOLN
INTRAMUSCULAR | Status: AC
  Filled 2018-07-16: qty 1

## 2018-07-16 MED ORDER — HEPARIN SOD (PORK) LOCK FLUSH 100 UNIT/ML IV SOLN
500.0000 [IU] | Freq: Once | INTRAVENOUS | Status: AC | PRN
  Administered 2018-07-16: 500 [IU]
  Filled 2018-07-16: qty 5

## 2018-07-16 MED ORDER — SODIUM CHLORIDE 0.9 % IV SOLN
75.0000 mg/m2 | Freq: Once | INTRAVENOUS | Status: AC
  Administered 2018-07-16: 160 mg via INTRAVENOUS
  Filled 2018-07-16: qty 16

## 2018-07-16 MED ORDER — ONDANSETRON HCL 8 MG PO TABS: 8.0000 mg | ORAL_TABLET | Freq: Three times a day (TID) | ORAL | 3 refills | Status: DC | PRN

## 2018-07-16 MED ORDER — DEXAMETHASONE SODIUM PHOSPHATE 10 MG/ML IJ SOLN
10.0000 mg | Freq: Once | INTRAMUSCULAR | Status: AC
  Administered 2018-07-16: 10 mg via INTRAVENOUS

## 2018-07-16 MED ORDER — SODIUM CHLORIDE 0.9 % IV SOLN: 10.0000 mg | Freq: Once | INTRAVENOUS | Status: DC

## 2018-07-16 MED ORDER — SODIUM CHLORIDE 0.9% FLUSH
10.0000 mL | INTRAVENOUS | Status: DC | PRN
  Administered 2018-07-16: 10 mL
  Filled 2018-07-16: qty 10

## 2018-07-16 MED ORDER — PEGFILGRASTIM 6 MG/0.6ML ~~LOC~~ PSKT
PREFILLED_SYRINGE | SUBCUTANEOUS | Status: AC
  Filled 2018-07-16: qty 0.6

## 2018-07-16 MED ORDER — SODIUM CHLORIDE 0.9 % IV SOLN
Freq: Once | INTRAVENOUS | Status: AC
  Administered 2018-07-16: 11:00:00 via INTRAVENOUS
  Filled 2018-07-16: qty 250

## 2018-07-16 MED ORDER — PROCHLORPERAZINE MALEATE 10 MG PO TABS: 10.0000 mg | ORAL_TABLET | Freq: Four times a day (QID) | ORAL | 9 refills | Status: AC | PRN

## 2018-07-16 MED ORDER — PEGFILGRASTIM 6 MG/0.6ML ~~LOC~~ PSKT
6.0000 mg | PREFILLED_SYRINGE | Freq: Once | SUBCUTANEOUS | Status: AC
  Administered 2018-07-16: 6 mg via SUBCUTANEOUS

## 2018-07-16 MED ORDER — HYDROCODONE-ACETAMINOPHEN 10-325 MG PO TABS: 1.0000 | ORAL_TABLET | ORAL | 0 refills | Status: DC | PRN

## 2018-07-16 MED FILL — HYDROCODON-APAP 10-325: 10-325 | 15 days supply | Qty: 90 | Fill #0

## 2018-07-16 NOTE — Patient Instructions (Signed)
[facility] Discharge Instructions for Patients Receiving Chemotherapy  Today you received the following chemotherapy agents docetaxel (Taxotere).   To help prevent nausea and vomiting after your treatment, we encourage you to take your nausea medication as prescribed by your physician.    If you develop nausea and vomiting that is not controlled by your nausea medication, call the clinic.   BELOW ARE SYMPTOMS THAT SHOULD BE REPORTED IMMEDIATELY:  *FEVER GREATER THAN 100.5 F  *CHILLS WITH OR WITHOUT FEVER  NAUSEA AND VOMITING THAT IS NOT CONTROLLED WITH YOUR NAUSEA MEDICATION  *UNUSUAL SHORTNESS OF BREATH  *UNUSUAL BRUISING OR BLEEDING  TENDERNESS IN MOUTH AND THROAT WITH OR WITHOUT PRESENCE OF ULCERS  *URINARY PROBLEMS  *BOWEL PROBLEMS  UNUSUAL RASH Items with * indicate a potential emergency and should be followed up as soon as possible.  Feel free to call the clinic should you have any questions or concerns. The clinic phone number is [phone].  Please show the [facility] at check-in to the Emergency Department and triage nurse.  Docetaxel injection What is this medicine? DOCETAXEL (doe se TAX el) is a chemotherapy drug. It targets fast dividing cells, like cancer cells, and causes these cells to die. This medicine is used to treat many types of cancers like breast cancer, certain stomach cancers, head and neck cancer, lung cancer, and prostate cancer. This medicine may be used for other purposes; ask your health care provider or pharmacist if you have questions. COMMON BRAND NAME(S): Docefrez, Taxotere What should I tell my health care provider before I take this medicine? They need to know if you have any of these conditions: -infection (especially a virus infection such as chickenpox, cold sores, or herpes) -liver disease -low blood counts, like low white cell, platelet, or red cell counts -an unusual or allergic reaction to docetaxel,  polysorbate 80, other chemotherapy agents, other medicines, foods, dyes, or preservatives -pregnant or trying to get pregnant -breast-feeding How should I use this medicine? This drug is given as an infusion into a vein. It is administered in a hospital or clinic by a specially trained health care professional. Talk to your pediatrician regarding the use of this medicine in children. Special care may be needed. Overdosage: If you think you have taken too much of this medicine contact a poison control center or emergency room at once. NOTE: This medicine is only for you. Do not share this medicine with others. What if I miss a dose? It is important not to miss your dose. Call your doctor or health care professional if you are unable to keep an appointment. What may interact with this medicine? -cyclosporine -erythromycin -ketoconazole -medicines to increase blood counts like filgrastim, pegfilgrastim, sargramostim -vaccines Talk to your doctor or health care professional before taking any of these medicines: -acetaminophen -aspirin -ibuprofen -ketoprofen -naproxen This list may not describe all possible interactions. Give your health care provider a list of all the medicines, herbs, non-prescription drugs, or dietary supplements you use. Also tell them if you smoke, drink alcohol, or use illegal drugs. Some items may interact with your medicine. What should I watch for while using this medicine? Your condition will be monitored carefully while you are receiving this medicine. You will need important blood work done while you are taking this medicine. This drug may make you feel generally unwell. This is not uncommon, as chemotherapy can affect healthy cells as well as cancer cells. Report any side effects. Continue your course of treatment  even though you feel ill unless your doctor tells you to stop. In some cases, you may be given additional medicines to help with side effects. Follow all  directions for their use. Call your doctor or health care professional for advice if you get a fever, chills or sore throat, or other symptoms of a cold or flu. Do not treat yourself. This drug decreases your body's ability to fight infections. Try to avoid being around people who are sick. This medicine may increase your risk to bruise or bleed. Call your doctor or health care professional if you notice any unusual bleeding. This medicine may contain alcohol in the product. You may get drowsy or dizzy. Do not drive, use machinery, or do anything that needs mental alertness until you know how this medicine affects you. Do not stand or sit up quickly, especially if you are an older patient. This reduces the risk of dizzy or fainting spells. Avoid alcoholic drinks. Do not become pregnant while taking this medicine. Women should inform their doctor if they wish to become pregnant or think they might be pregnant. There is a potential for serious side effects to an unborn child. Talk to your health care professional or pharmacist for more information. Do not breast-feed an infant while taking this medicine. What side effects may I notice from receiving this medicine? Side effects that you should report to your doctor or health care professional as soon as possible: -allergic reactions like skin rash, itching or hives, swelling of the face, lips, or tongue -low blood counts - This drug may decrease the number of white blood cells, red blood cells and platelets. You may be at increased risk for infections and bleeding. -signs of infection - fever or chills, cough, sore throat, pain or difficulty passing urine -signs of decreased platelets or bleeding - bruising, pinpoint red spots on the skin, black, tarry stools, nosebleeds -signs of decreased red blood cells - unusually weak or tired, fainting spells, lightheadedness -breathing problems -fast or irregular heartbeat -low blood pressure -mouth sores -nausea  and vomiting -pain, swelling, redness or irritation at the injection site -pain, tingling, numbness in the hands or feet -swelling of the ankle, feet, hands -weight gain Side effects that usually do not require medical attention (report to your doctor or health care professional if they continue or are bothersome): -bone pain -complete hair loss including hair on your head, underarms, pubic hair, eyebrows, and eyelashes -diarrhea -excessive tearing -changes in the color of fingernails -loosening of the fingernails -nausea -muscle pain -red flush to skin -sweating -weak or tired This list may not describe all possible side effects. Call your doctor for medical advice about side effects. You may report side effects to FDA at 1-800-FDA-1088. Where should I keep my medicine? This drug is given in a hospital or clinic and will not be stored at home. NOTE: This sheet is a summary. It may not cover all possible information. If you have questions about this medicine, talk to your doctor, pharmacist, or health care provider.  2018 [employer]vier/Gold Standard (2015-11-22 12:32:56)

## 2018-07-16 NOTE — Assessment & Plan Note (Signed)
She has symptomatic improvement since recent paracentesis

## 2018-07-16 NOTE — Telephone Encounter (Signed)
Pt scheduled per 9/13 los.

## 2018-07-16 NOTE — Assessment & Plan Note (Signed)
She has chronic kidney disease stage III, stable She will continue risk factor modification

## 2018-07-16 NOTE — Assessment & Plan Note (Signed)
I reviewed the current guidelines with her This is an FDA approved drug Goals of treatment is palliative  Gynecol Oncol. 2003 Feb;88(2):130-5. 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 Rose PG1, Blessing JA, Wheeler HG, [place], Cumming D, [facility]. Abstract OBJECTIVES:  Docetaxel is an inhibitor of microtubule depolymerization and has demonstrated activity in paclitaxel-resistant breast cancer and gynecologic cancer. The Gynecologic Oncology Group (GOG) conducted a study of docetaxel in paclitaxel-resistant ovarian and peritoneal carcinoma to determine its activity, and nature and degree of toxicity, in this cohort of patients. METHODS:  Patients with platinum- and paclitaxel-resistant ovarian or peritoneal carcinoma, defined as progression while on or within 6 months of therapy, were eligible if they had measurable disease and had not received more than one chemotherapy regimen. Docetaxel at a dose of 100 mg/m(2) was administered iv over 1 h every 21 days. A prophylactic regimen of oral dexamethasone 8 mg bid was begun 24 h before docetaxel administration and continued for 48 h thereafter. Hepatic function was strictly monitored. RESULTS:  Sixty patients were entered and treated with a total of 256 courses, with all 60 evaluable for toxicity and 58 evaluable for response. Responses were observed in 22.4% of patients, with 5.2% achieving complete response and 17.2% achieving partial response (95% CI, 12.5-35.3%). The median duration of response was 2.5 months. The likelihood of observing a response did not appear to be related to the length of the prior paclitaxel-free interval or duration of prior paclitaxel infusions. The principal adverse effect of grade 4 neutropenia occurred in 75% of patients. There was one treatment-related death. Dose reductions were required in 36% of patients. 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  We discussed the role of chemotherapy. The intent is for palliative.  We discussed some of the risks, benefits, side-effects of Docetaxel.   Some of the short term side-effects included, though not limited to, risk of severe allergic reaction, fatigue, weight loss, pancytopenia, life-threatening infections, need for transfusions of blood products, nausea, vomiting, change in bowel habits, loss of hair, admission to hospital for various reasons, and risks of death.   Long term side-effects are also discussed including risks of infertility, permanent damage to nerve function, chronic fatigue, and rare secondary malignancy including bone marrow disorders.   The patient is aware that the response rates discussed earlier is not guaranteed.   After a long discussion, patient made an informed decision to proceed with the prescribed plan of care.   Patient education material was dispensed  The patient is willing to proceed with palliative chemotherapy with single agent Taxotere Due to her high body mass index, I recommend we keep at 75 mg/m We discussed the risk and benefits of G-CSF support and she agreed to proceed She would like her future treatment to be given locally and she has appointment to meet with the medical oncologist in 2 weeks I review all her medication list and refill all her prescriptions today.

## 2018-07-16 NOTE — Assessment & Plan Note (Signed)
We had extensive discussion about goals of care She understood that treatment goals are strictly palliative in nature The patient is not willing to accept palliative care/hospice yet and desire further treatment

## 2018-07-16 NOTE — Assessment & Plan Note (Signed)
This is likely due to recent treatment. The patient denies recent history of bleeding such as epistaxis, hematuria or hematochezia. She is asymptomatic from the anemia. I will observe for now.   

## 2018-07-16 NOTE — Assessment & Plan Note (Signed)
We discussed the risk of severe hyperglycemia while on steroid therapy She will adjust her insulin accordingly

## 2018-07-16 NOTE — Assessment & Plan Note (Signed)
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

## 2018-07-16 NOTE — Progress Notes (Signed)
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 2003 Feb;88(2):130-5. 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 Rose PG1, Blessing JA, Elbe HG, [place], Pence D, [facility]. Abstract OBJECTIVES:  Docetaxel is an inhibitor of microtubule depolymerization and has demonstrated activity in paclitaxel-resistant breast cancer and gynecologic cancer. The Gynecologic Oncology Group (GOG) conducted a study of docetaxel in paclitaxel-resistant ovarian and peritoneal carcinoma to determine its activity, and nature and degree of toxicity, in this cohort of patients. METHODS:  Patients with platinum- and paclitaxel-resistant ovarian or peritoneal carcinoma, defined as progression while on or within 6 months of therapy, were eligible if they had measurable disease and had not received more than one chemotherapy regimen. Docetaxel at a dose of 100 mg/m(2) was administered iv over 1 h every 21 days. A prophylactic regimen of oral dexamethasone 8 mg bid was begun 24 h before docetaxel administration and continued for 48 h thereafter. Hepatic function was strictly monitored. RESULTS:  Sixty patients were entered and treated with a total of 256 courses, with all 60 evaluable for toxicity and 58 evaluable for response. Responses were observed in 22.4% of patients, with 5.2% achieving complete response and 17.2% achieving partial response (95% CI, 12.5-35.3%). The median duration of response was 2.5 months. The likelihood of observing a response did not appear to be related to the length of the prior paclitaxel-free interval or duration of prior paclitaxel infusions.  The principal adverse effect of grade 4 neutropenia occurred in 75% of patients. There was one treatment-related death. Dose reductions were required in 36% of patients. CONCLUSIONS:  Docetaxel is active in paclitaxel-resistant ovarian and peritoneal cancer but, in view of significant hematologic toxicity, further study is warranted to ascertain its optimal dose and schedule.  We discussed the role of chemotherapy. The intent is for palliative.  We discussed some of the risks, benefits, side-effects of Docetaxel.   Some of the short term side-effects included, though not limited to, risk of severe allergic reaction, fatigue, weight loss, pancytopenia, life-threatening infections, need for transfusions of blood products, nausea, vomiting, change in bowel habits, loss of hair, admission to hospital for various reasons, and risks of death.   Long term side-effects are also discussed including risks of infertility, permanent damage to nerve function, chronic fatigue, and rare secondary malignancy including bone marrow disorders.   The patient is aware that the response rates discussed earlier is not guaranteed.   After a long discussion, patient made an informed decision to proceed with the prescribed plan of care.   Patient education material was dispensed  The patient is willing to proceed with palliative chemotherapy with single agent Taxotere Due to her high body mass index, I recommend we keep at 75 mg/m We discussed the risk and benefits of G-CSF support and she agreed to proceed She would like her future treatment to be given locally and she has appointment to meet with the medical oncologist in 2 weeks I review all her medication list and refill all her prescriptions today.  Anemia due to antineoplastic chemotherapy This is likely due to recent treatment. The patient denies recent history of bleeding such as epistaxis, hematuria or hematochezia. She is asymptomatic from the  anemia. I  will observe for now.    Malignant ascites She has symptomatic improvement since recent paracentesis   Type 2 diabetes mellitus with complication, with long term current use of insulin pump (HCC) We discussed the risk of severe hyperglycemia while on steroid therapy She will adjust her insulin accordingly  Peripheral neuropathy due to chemotherapy [facility]) She has mild residual peripheral neuropathy from prior treatment She is aware about slight risk of worsening neuropathy while on treatment  CKD (chronic kidney disease), stage III ([place]) She has chronic kidney disease stage III, stable She will continue risk factor modification  Goals of care, counseling/discussion We had extensive discussion about goals of care She understood that treatment goals are strictly palliative in nature The patient is not willing to accept palliative care/hospice yet and desire further treatment    No orders of the defined types were placed in this encounter.   INTERVAL HISTORY: Please see below for problem oriented charting. She returns with her husband to start cycle 1 of treatment today Since last time I saw her, she felt better She denies worsening abdominal distention since paracentesis Her appetite is stable No recent nausea or changes in bowel habits.  SUMMARY OF ONCOLOGIC HISTORY: Oncology History   Negative genetic testing ER 80% PR 5% positive, Her 2 neu negative     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[place])   04/29/2016 Imaging    CT abd/pelvis- Extensive omental caking as well as moderate amount of ascites within the abdomen most compatible with peritoneal metastatic disease, of unknown primary. This may potentially be ovarian or a GI in etiology.    04/30/2016 Tumor Marker    CA 125- 7149.0 (H)    05/01/2016 Procedure    US paracentesis- Successful ultrasound-guided paracentesis yielding 1.8 liters of peritoneal fluid.    05/01/2016 Imaging    US pelvis- Both transabdominal  and transvaginal sonography are significantly limited by large patient habitus and ascites. Neither uterus or ovaries were visualized on this exam.    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    05/08/2016 Imaging    CT chest- No evidence of metastatic disease in the chest. Peritoneal/omental disease with abdominal ascites in the upper abdomen, incompletely visualized.     05/13/2016 Procedure    Placement of single lumen port a cath via right internal jugular vein. The catheter tip lies at the cavoatrial junction. A power injectable port a cath was placed and is ready for immediate use.    05/15/2016 Procedure    US Paracentesis- 3400 ml yellow colored ascites removed    05/15/2016 - 09/18/2016 Chemotherapy    Carboplatin/Paclitaxel every 21 days x 7 cycles    07/01/2016 Miscellaneous    Genetic Counseling by [name]-  Genetic testing was normal, and did not reveal a deleterious mutation in these genes.     07/08/2016 Imaging    CT CAP- 1. Small volume ascites, significantly decreased. 2. Stable diffuse omental soft tissue caking and diffuse peritoneal thickening along the bilateral paracolic gutters and bilateral pelvic peritoneal reflections, consistent with peritoneal carcinomatosis. 3. Stable asymmetrically enlarged right ovary, which may represent the primary site of ovarian malignancy. 4. No evidence of metastatic disease in the chest. No new sites of metastatic disease in the abdomen or pelvis.    07/09/2016 Miscellaneous    Gyn Onc re-evaluation- modest response to therapy, 3 more cycles of chemotherapy recommended.  09/11/2016 Imaging    CT C/A/P No significant change omental soft tissue caking, consistent with metastatic disease. Mild ascites is decreased since previous study.  Increased calcification along peritoneal surface in pelvic cul-de-sac,  consistent with treated peritoneal metastatic disease.  Stable 4.5cm homogeneous right pelvic mass, which favors a uterine fibroid although right ovarian neoplasm cannot definitely be excluded.  No new or progressive metastatic disease identified. No evidence of metastatic disease within the thorax.     10/14/2016 Procedure    Robotic-assisted laparoscopic total hysterectomy with bilateral salpingoophorectomy, ex lap omentectomy, radical tumor debulking by Dr. Denman George    10/17/2016 Pathology Results    Diagnosis 1. 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 - CERVIX AND ENDOMETRIUM FREE OF TUMOR. - SEE ONCOLOGY TABLE AND COMMENT. 2. Soft tissue, biopsy, umbilical nodule - HIGH GRADE SEROUS CARCINOMA. 3. Omentum, resection for tumor - HIGH GRADE SEROUS CARCINOMA, 33 CM.    11/06/2016 - 12/25/2016 Chemotherapy    Carboplatin/Paclitaxel x 3 cycles     01/12/2017 Imaging    CT CAP- 1. Interval hysterectomy, bilateral salpingo-oophorectomy and omentectomy without evidence of tumor recurrence. 2. No evidence of metastatic disease. 3. 5 mm nonobstructing lower pole left renal calculus.    01/13/2017 Remission    No evidence of residual disease on CT imaging.    05/04/2017 Imaging    CT CAP- New small amount of ascites within the abdomen and pelvis since 01/12/2017 which could indicate disease progression but no new identifiable tumor and no significant change in omental and mild peritoneal thickening.  No evidence of metastatic disease within the chest.  Coronary artery disease.  Aortic Atherosclerosis (ICD10-I70.0).    05/04/2017 Tumor Marker    Patient's tumor was tested for the following markers: CA 125 Results of the tumor marker test revealed 3244    05/18/2017 Imaging    ECHO; EF 60% -  65    05/25/2017 - 07/07/2017 Chemotherapy    She received Doxil and Avastin. Treatment is  stopped due to disease progression    06/01/2017 Procedure    Successful ultrasound-guided therapeutic paracentesis yielding 2.8 liters of peritoneal fluid    06/22/2017 Tumor Marker    Patient's tumor was tested for the following markers: CA 125 Results of the tumor marker test revealed 13440    07/20/2017 Imaging    Mild increase in peritoneal carcinoma within abdomen pelvis since previous study. No significant change and minimal ascites.  No evidence of metastatic disease within the thorax. New mild airspace opacity in left lower lobe, consistent with inflammatory or infectious etiology.    07/30/2017 Tumor Marker    Patient's tumor was tested for the following markers: CA 125 Results of the tumor marker test revealed 12099    07/30/2017 - 06/14/2018 Chemotherapy    The patient had gemzar. Cisplatin is added on 11/02/18    08/03/2017 - 08/05/2017 Hospital Admission    She was admitted to the hospital for management of UTI and neutropenic fever    08/13/2017 Adverse Reaction    Dose of chemotherapy is reduced due to neutropenic sepsis    08/24/2017 Tumor Marker    Patient's tumor was tested for the following markers: CA 125 Results of the tumor marker test revealed 7431    09/21/2017 Tumor Marker    Patient's tumor was tested for the following markers: CA 125 Results of the tumor marker test revealed 4176    10/09/2017 Imaging  No significant change in peritoneal carcinomatosis since previous study.  No new or progressive metastatic disease within the abdomen or pelvis.  Stable tiny nonobstructive left renal calculus. No evidence of ureteral calculi or hydronephrosis.    11/10/2017 Tumor Marker    Patient's tumor was tested for the following markers: CA 125 Results of the tumor marker test revealed 4257    11/24/2017 Tumor Marker    Patient's tumor was tested for the following markers: CA 125 Results of the tumor marker test revealed 3377    01/12/2018 Tumor Marker     Patient's tumor was tested for the following markers: CA 125 Results of the tumor marker test revealed 1595    01/21/2018 Imaging    1. Stable to slightly improved omental, gastrohepatic ligament and pelvic disease as detailed above. No new/progressive findings. 2. No acute abdominal or pelvic findings. 3. Stable lower pole left renal calculus.    02/08/2018 Tumor Marker    Patient's tumor was tested for the following markers: CA 125 Results of the tumor marker test revealed 1058    03/08/2018 Tumor Marker    Patient's tumor was tested for the following markers: CA-125 Results of the tumor marker test revealed 737.1    04/02/2018 Tumor Marker    Patient's tumor was tested for the following markers: CA-125 Results of the tumor marker test revealed 542.8    06/14/2018 Tumor Marker    CA 125- 1010    06/30/2018 Imaging    1. Mixed appearance but overall progression, with the left upper quadrant omental tumor slightly improved but with some potential new deposition of tumor along the hepatic flexure margin. There is also new moderate ascites which must be considered suspicious for malignant ascites/peritoneal spread. 2. Other imaging findings of potential clinical significance: Nonobstructive left nephrolithiasis. Aortic Atherosclerosis (ICD10-I70.0). Mild subcutaneous edema along the sixth anterior abdominal wall potentially from mild panniculitis.    07/08/2018 Procedure    Successful ultrasound-guided paracentesis yielding 4 liters of peritoneal fluid.    07/15/2018 -  Chemotherapy    The patient had pegfilgrastim (NEULASTA ONPRO KIT) injection 6 mg, 6 mg, Subcutaneous, Once, 1 of 4 cycles Administration: 6 mg (07/16/2018) DOCEtaxel (TAXOTERE) 160 mg in sodium chloride 0.9 % 250 mL chemo infusion, 75 mg/m2 = 160 mg, Intravenous,  Once, 1 of 4 cycles Administration: 160 mg (07/16/2018)  for chemotherapy treatment.      REVIEW OF SYSTEMS:   Constitutional: Denies fevers, chills or abnormal  weight loss Eyes: Denies blurriness of vision Ears, nose, mouth, throat, and face: Denies mucositis or sore throat Respiratory: Denies cough, dyspnea or wheezes Cardiovascular: Denies palpitation, chest discomfort or lower extremity swelling Skin: Denies abnormal skin rashes Lymphatics: Denies new lymphadenopathy or easy bruising Neurological:Denies numbness, tingling or new weaknesses Behavioral/Psych: Mood is stable, no new changes  All other systems were reviewed with the patient and are negative.  I have reviewed the past medical history, past surgical history, social history and family history with the patient and they are unchanged from previous note.  ALLERGIES:  is allergic to avandia [rosiglitazone]; micronase [glyburide]; and actos [pioglitazone].  MEDICATIONS:  Current Outpatient Medications  Medication Sig Dispense Refill  . atorvastatin (LIPITOR) 40 MG tablet Take 1 tablet (40 mg total) by mouth daily. 90 tablet 3  . benazepril (LOTENSIN) 10 MG tablet Take 0.5 tablets (5 mg total) by mouth daily. 45 tablet 2  . Biotin 1000 MCG tablet Take 1,000 mcg by mouth 3 (three) times daily.    Marland Kitchen  dexamethasone (DECADRON) 4 MG tablet Take 2 tablets (8 mg total) by mouth 2 (two) times daily. Start the day before Taxotere. Then daily after chemo for 2 days, every 3 weeks 30 tablet 1  . furosemide (LASIX) 40 MG tablet Take 1 tablet (40 mg total) by mouth 2 (two) times daily. 60 tablet 9  . gabapentin (NEURONTIN) 300 MG capsule Take 1 capsule (300 mg total) by mouth 3 (three) times daily. 90 capsule 11  . glucose blood (ONE TOUCH ULTRA TEST) test strip USE TO CHECK BLOOD SUGAR UP TO 5 TIMES A DAY 500 each 3  . HYDROcodone-acetaminophen (NORCO) 10-325 MG tablet Take 1 tablet by mouth every 4 (four) hours as needed. 90 tablet 0  . insulin lispro (HUMALOG) 100 UNIT/ML injection USE IN INSULIN PUMP AS DIRECTED. MAX DAILY DOSE OF 110 UNITS PER DAY 110 mL 0  . lidocaine-prilocaine (EMLA) cream Apply  a quarter size amount to port site 1 hour prior to chemo. Do not rub in. Cover with plastic wrap. 30 g 3  . loperamide (IMODIUM A-D) 2 MG tablet Take 1 tablet (2 mg total) by mouth 4 (four) times daily as needed for diarrhea or loose stools. 30 tablet 0  . loratadine (CLARITIN) 10 MG tablet Take 10 mg by mouth daily.    . magnesium oxide (MAG-OX) 400 (241.3 Mg) MG tablet Take 1 tablet (400 mg total) by mouth 3 (three) times daily. 90 tablet 3  . metFORMIN (GLUCOPHAGE) 1000 MG tablet TAKE 1 TABLET BY MOUTH TWICE DAILY WITH A MEAL 180 tablet 3  . Multiple Vitamin (MULTIVITAMIN WITH MINERALS) TABS Take 1 tablet by mouth daily.    . multivitamin-lutein (OCUVITE-LUTEIN) CAPS capsule Take 1 capsule by mouth daily.    Marland Kitchen omeprazole (PRILOSEC) 40 MG capsule Take 1 capsule (40 mg total) daily by mouth. 90 capsule 3  . ondansetron (ZOFRAN) 8 MG tablet Take 1 tablet (8 mg total) by mouth every 8 (eight) hours as needed for nausea or vomiting. 90 tablet 3  . polyethylene glycol (MIRALAX / GLYCOLAX) packet Take 17 g by mouth daily as needed for mild constipation.     . prochlorperazine (COMPAZINE) 10 MG tablet Take 1 tablet (10 mg total) by mouth every 6 (six) hours as needed for nausea or vomiting. 60 tablet 9  . senna (SENOKOT) 8.6 MG TABS tablet Take 1 tablet (8.6 mg total) by mouth at bedtime as needed for moderate constipation. 120 each 0  . vitamin B-12 (CYANOCOBALAMIN) 1000 MCG tablet Take 1,000 mcg by mouth daily.    . vitamin E (VITAMIN E) 400 UNIT capsule Take 400 Units by mouth daily.     No current facility-administered medications for this visit.    Facility-Administered Medications Ordered in Other Visits  Medication Dose Route Frequency Provider Last Rate Last Dose  . sodium chloride flush (NS) 0.9 % injection 10 mL  10 mL Intracatheter PRN Alvy Bimler, Michelyn Scullin, MD   10 mL at 07/16/18 1342    PHYSICAL EXAMINATION: ECOG PERFORMANCE STATUS: 2 - Symptomatic, <50% confined to bed  Vitals:   07/16/18  0941  BP: (!) 147/47  Pulse: 99  Resp: 18  Temp: 98.4 F (36.9 C)  SpO2: 95%   Filed Weights   07/16/18 0941  Weight: 224 lb 6.4 oz (101.8 kg)    GENERAL:alert, no distress and comfortable SKIN: skin color, texture, turgor are normal, no rashes or significant lesions EYES: normal, Conjunctiva are pink and non-injected, sclera clear OROPHARYNX:no exudate, no erythema and  lips, buccal mucosa, and tongue normal  NECK: supple, thyroid normal size, non-tender, without nodularity LYMPH:  no palpable lymphadenopathy in the cervical, axillary or inguinal LUNGS: clear to auscultation and percussion with normal breathing effort HEART: regular rate & rhythm and no murmurs and no lower extremity edema ABDOMEN:abdomen soft, non-tender and normal bowel sounds Musculoskeletal:no cyanosis of digits and no clubbing  NEURO: alert & oriented x 3 with fluent speech, no focal motor/sensory deficits  LABORATORY DATA:  I have reviewed the data as listed    Component Value Date/Time   NA 143 07/16/2018 0824   NA 139 11/02/2017 0756   K 4.9 07/16/2018 0824   K 4.7 11/02/2017 0756   CL 104 07/16/2018 0824   CO2 24 07/16/2018 0824   CO2 26 11/02/2017 0756   GLUCOSE 171 (H) 07/16/2018 0824   GLUCOSE 140 11/02/2017 0756   BUN 33 (H) 07/16/2018 0824   BUN 23.7 11/02/2017 0756   CREATININE 1.28 (H) 07/16/2018 0824   CREATININE 0.9 11/02/2017 0756   CALCIUM 10.0 07/16/2018 0824   CALCIUM 9.4 11/02/2017 0756   PROT 6.7 07/16/2018 0824   PROT 7.0 11/02/2017 0756   ALBUMIN 3.6 07/16/2018 0824   ALBUMIN 3.5 11/02/2017 0756   AST 13 (L) 07/16/2018 0824   AST 18 11/02/2017 0756   ALT 12 07/16/2018 0824   ALT 23 11/02/2017 0756   ALKPHOS 103 07/16/2018 0824   ALKPHOS 108 11/02/2017 0756   BILITOT 0.3 07/16/2018 0824   BILITOT 0.24 11/02/2017 0756   GFRNONAA 43 (L) 07/16/2018 0824   GFRAA 50 (L) 07/16/2018 0824    No results found for: SPEP, UPEP  Lab Results  Component Value Date   WBC 9.4  07/16/2018   NEUTROABS 8.6 (H) 07/16/2018   HGB 9.1 (L) 07/16/2018   HCT 28.9 (L) 07/16/2018   MCV 108.6 (H) 07/16/2018   PLT 194 07/16/2018      Chemistry      Component Value Date/Time   NA 143 07/16/2018 0824   NA 139 11/02/2017 0756   K 4.9 07/16/2018 0824   K 4.7 11/02/2017 0756   CL 104 07/16/2018 0824   CO2 24 07/16/2018 0824   CO2 26 11/02/2017 0756   BUN 33 (H) 07/16/2018 0824   BUN 23.7 11/02/2017 0756   CREATININE 1.28 (H) 07/16/2018 0824   CREATININE 0.9 11/02/2017 0756      Component Value Date/Time   CALCIUM 10.0 07/16/2018 0824   CALCIUM 9.4 11/02/2017 0756   ALKPHOS 103 07/16/2018 0824   ALKPHOS 108 11/02/2017 0756   AST 13 (L) 07/16/2018 0824   AST 18 11/02/2017 0756   ALT 12 07/16/2018 0824   ALT 23 11/02/2017 0756   BILITOT 0.3 07/16/2018 0824   BILITOT 0.24 11/02/2017 0756       RADIOGRAPHIC STUDIES: I have personally reviewed the radiological images as listed and agreed with the findings in the report. Ct Abdomen Pelvis W Contrast  Result Date: 07/01/2018 CLINICAL DATA:  Restaging of ovarian cancer EXAM: CT ABDOMEN AND PELVIS WITH CONTRAST TECHNIQUE: Multidetector CT imaging of the abdomen and pelvis was performed using the standard protocol following bolus administration of intravenous contrast. CONTRAST:  35m OMNIPAQUE IOHEXOL 300 MG/ML  SOLN COMPARISON:  01/21/2018 FINDINGS: Lower chest: Unremarkable Hepatobiliary: Linear calcifications along the upper margin of the caudate lobe similar to prior. Gallbladder unremarkable. No well-defined intraparenchymal mass. No biliary dilatation. Pancreas: Unremarkable Spleen: Punctate calcifications indicating old granulomatous disease. Adrenals/Urinary Tract: Adrenal glands normal. 0.4 cm  left kidney lower pole nonobstructive renal calculus. Stomach/Bowel: On image 34/5 there is a somewhat masslike appearance within or along the hepatic flexure of the colon. This may simply be due to a confluence of fluid as  there is no mass in this vicinity the prior exam the tumor deposition along the colon wall is also a possibility. Vascular/Lymphatic: Aortoiliac atherosclerotic vascular disease. No retroperitoneal or pelvic adenopathy. Reproductive: The uterus and ovaries are absent. Other: Moderate amount of perihepatic and perisplenic ascites also tracking along the paracolic gutters, in the pelvis, along the loops of bowel as well as the gastric margins. Left upper quadrant band of omental tumor 1.8 by 3.7 cm, formerly 1.7 by 4.0 cm. Gastrohepatic ligament tumor nodularity in right upper quadrant tumor nodularity are roughly stable compared to previous exam. Mild subcutaneous inflammation along the pannus. Musculoskeletal: Postoperative findings in the lumbar spine, not changed. IMPRESSION: 1. Mixed appearance but overall progression, with the left upper quadrant omental tumor slightly improved but with some potential new deposition of tumor along the hepatic flexure margin. There is also new moderate ascites which must be considered suspicious for malignant ascites/peritoneal spread. 2. Other imaging findings of potential clinical significance: Nonobstructive left nephrolithiasis. Aortic Atherosclerosis (ICD10-I70.0). Mild subcutaneous edema along the sixth anterior abdominal wall potentially from mild panniculitis. Electronically Signed   By: Van Clines M.D.   On: 07/01/2018 07:30   US Paracentesis  Result Date: 07/08/2018 INDICATION: Ovarian cancer, malignant ascites EXAM: ULTRASOUND GUIDED THERAPEUTIC PARACENTESIS MEDICATIONS: None. COMPLICATIONS: None immediate. PROCEDURE: Procedure, benefits, and risks of procedure were discussed with patient. Written informed consent for procedure was obtained. Time out protocol followed. Adequate collection of ascites localized by ultrasound in RIGHT lower quadrant. Skin prepped and draped in usual sterile fashion. Skin and soft tissues anesthetized with 10 mL of 1% lidocaine.  5 [place] Yueh catheter placed into peritoneal cavity. 4 L of yellow ascitic fluid aspirated by vacuum bottle suction. Procedure tolerated well by patient without immediate complication. FINDINGS: As above IMPRESSION: Successful ultrasound-guided paracentesis yielding 4 liters of peritoneal fluid. Electronically Signed   By: Lavonia Dana M.D.   On: 07/08/2018 15:05    All questions were answered. The patient knows to call the clinic with any problems, questions or concerns. No barriers to learning was detected.  I spent 30 minutes counseling the patient face to face. The total time spent in the appointment was 40 minutes and more than 50% was on counseling and review of test results  Heath Lark, MD 07/16/2018 4:49 PM

## 2018-07-17 LAB — CA 125: Cancer Antigen (CA) 125: 1172 U/mL — ABNORMAL HIGH (ref 0.0–38.1)

## 2018-07-19 ENCOUNTER — Ambulatory Visit: Payer: Medicare Other

## 2018-07-19 ENCOUNTER — Inpatient Hospital Stay: Payer: Medicare Other

## 2018-07-23 ENCOUNTER — Other Ambulatory Visit: Payer: Medicare Other

## 2018-07-26 ENCOUNTER — Ambulatory Visit: Payer: Medicare Other | Admitting: Hematology and Oncology

## 2018-07-26 ENCOUNTER — Ambulatory Visit: Payer: Medicare Other

## 2018-07-28 ENCOUNTER — Inpatient Hospital Stay (HOSPITAL_COMMUNITY): Payer: Medicare Other | Attending: Hematology | Admitting: Hematology

## 2018-07-28 ENCOUNTER — Encounter (HOSPITAL_COMMUNITY): Payer: Self-pay | Admitting: Hematology

## 2018-07-28 ENCOUNTER — Other Ambulatory Visit: Payer: Self-pay

## 2018-07-28 VITALS — BP 127/46 | HR 117 | Resp 18 | Wt 222.6 lb

## 2018-07-28 DIAGNOSIS — N189 Chronic kidney disease, unspecified: Secondary | ICD-10-CM | POA: Insufficient documentation

## 2018-07-28 DIAGNOSIS — K219 Gastro-esophageal reflux disease without esophagitis: Secondary | ICD-10-CM | POA: Insufficient documentation

## 2018-07-28 DIAGNOSIS — E1122 Type 2 diabetes mellitus with diabetic chronic kidney disease: Secondary | ICD-10-CM | POA: Insufficient documentation

## 2018-07-28 DIAGNOSIS — C578 Malignant neoplasm of overlapping sites of female genital organs: Secondary | ICD-10-CM | POA: Diagnosis not present

## 2018-07-28 DIAGNOSIS — Z801 Family history of malignant neoplasm of trachea, bronchus and lung: Secondary | ICD-10-CM

## 2018-07-28 DIAGNOSIS — Z90722 Acquired absence of ovaries, bilateral: Secondary | ICD-10-CM | POA: Insufficient documentation

## 2018-07-28 DIAGNOSIS — G893 Neoplasm related pain (acute) (chronic): Secondary | ICD-10-CM | POA: Diagnosis not present

## 2018-07-28 DIAGNOSIS — Z794 Long term (current) use of insulin: Secondary | ICD-10-CM | POA: Insufficient documentation

## 2018-07-28 DIAGNOSIS — C569 Malignant neoplasm of unspecified ovary: Secondary | ICD-10-CM

## 2018-07-28 DIAGNOSIS — G629 Polyneuropathy, unspecified: Secondary | ICD-10-CM | POA: Insufficient documentation

## 2018-07-28 DIAGNOSIS — I700 Atherosclerosis of aorta: Secondary | ICD-10-CM | POA: Insufficient documentation

## 2018-07-28 DIAGNOSIS — C786 Secondary malignant neoplasm of retroperitoneum and peritoneum: Secondary | ICD-10-CM | POA: Insufficient documentation

## 2018-07-28 DIAGNOSIS — Z9641 Presence of insulin pump (external) (internal): Secondary | ICD-10-CM

## 2018-07-28 DIAGNOSIS — R180 Malignant ascites: Secondary | ICD-10-CM | POA: Insufficient documentation

## 2018-07-28 DIAGNOSIS — Z806 Family history of leukemia: Secondary | ICD-10-CM | POA: Diagnosis not present

## 2018-07-28 DIAGNOSIS — I129 Hypertensive chronic kidney disease with stage 1 through stage 4 chronic kidney disease, or unspecified chronic kidney disease: Secondary | ICD-10-CM | POA: Diagnosis not present

## 2018-07-28 DIAGNOSIS — Z803 Family history of malignant neoplasm of breast: Secondary | ICD-10-CM | POA: Diagnosis not present

## 2018-07-28 DIAGNOSIS — I2510 Atherosclerotic heart disease of native coronary artery without angina pectoris: Secondary | ICD-10-CM

## 2018-07-28 DIAGNOSIS — C481 Malignant neoplasm of specified parts of peritoneum: Secondary | ICD-10-CM

## 2018-07-28 DIAGNOSIS — R600 Localized edema: Secondary | ICD-10-CM

## 2018-07-28 DIAGNOSIS — Z90710 Acquired absence of both cervix and uterus: Secondary | ICD-10-CM | POA: Diagnosis not present

## 2018-07-28 DIAGNOSIS — R5383 Other fatigue: Secondary | ICD-10-CM | POA: Insufficient documentation

## 2018-07-28 DIAGNOSIS — Z79899 Other long term (current) drug therapy: Secondary | ICD-10-CM | POA: Diagnosis not present

## 2018-07-28 DIAGNOSIS — E785 Hyperlipidemia, unspecified: Secondary | ICD-10-CM | POA: Insufficient documentation

## 2018-07-28 MED ORDER — HYDROCODONE-ACETAMINOPHEN 10-325 MG PO TABS: 1.0000 | ORAL_TABLET | ORAL | 0 refills | Status: DC | PRN

## 2018-07-28 NOTE — Patient Instructions (Signed)
[facility] at [facility] Discharge Instructions     Thank you for choosing [facility] at [facility] to provide your oncology and hematology care.  To afford each patient quality time with our provider, please arrive at least 15 minutes before your scheduled appointment time.   If you have a lab appointment with the Cancer Center please come in thru the  Main Entrance and check in at the main information desk  You need to re-schedule your appointment should you arrive 10 or more minutes late.  We strive to give you quality time with our providers, and arriving late affects you and other patients whose appointments are after yours.  Also, if you no show three or more times for appointments you may be dismissed from the clinic at the providers discretion.     Again, thank you for choosing [facility].  Our hope is that these requests will decrease the amount of time that you wait before being seen by our physicians.       _____________________________________________________________  Should you have questions after your visit to [facility], please contact our office at [phone] between the hours of 8:00 a.m. and 4:30 p.m.  Voicemails left after 4:00 p.m. will not be returned until the following business day.  For prescription refill requests, have your pharmacy contact our office and allow 72 hours.    Cancer Center Support Programs:   > Cancer Support Group  2nd Tuesday of the month 1pm-2pm, Journey Room    

## 2018-07-28 NOTE — Progress Notes (Signed)
[facility] NOTE  Patient Care Team: Eustaquio Maize, MD as PCP - General (Pediatrics)  CHIEF COMPLAINTS/PURPOSE OF CONSULTATION:  Palliative treatment for peritoneal carcinoma close to home.  HISTORY OF PRESENTING ILLNESS:  Ellen Hunt 67 y.o. female is seen in consultation today for further work-up and management of high-grade serous carcinoma of the peritoneum.  She was originally diagnosed in June 2017 with abdominal distention.  She underwent chemotherapy with 7 cycles of carboplatin and paclitaxel from 05/15/2016 through 09/18/2016 followed by robotic assisted laparoscopic total hysterectomy, bilateral salpingo-oophorectomy and omentectomy on 10/14/2016.  After tumor debulking surgery, she underwent 3 cycles of carboplatin and paclitaxel from 11/06/2016 through 12/25/2016.  She had a recurrence in July 2018 for which she was treated with Doxil and bevacizumab from 05/25/2017 through 07/07/2017.  As there was disease progression, she was started on gemcitabine on 07/30/2017.  This regimen was later changed to cisplatin and gemcitabine from 11/02/2017.  She completed 7 cycles of day 1, day 8 gemcitabine and cisplatin every 21 days until 06/14/2018.  CT scan on 06/24/2018 showed progression.  She was recommended to have docetaxel every 21 days.  She received first cycle on 07/16/2018.  She did experience more pains in the abdominal region and in the lower back for which she takes hydrocodone up to 6 tablets daily.  She did experience occasional nausea but denied any vomiting.  She underwent germline mutation testing on 06/25/2018 which was negative for common mutations.  She lives at home with her husband and does her daily activities without any major problems.  She had paracentesis done on 07/08/2018 with 4 L of fluid removed.  She is taking Lasix 40 mg twice daily.  She has some neuropathy with on and off numbness in the toes.  She is not taking any treatment for it. -Family history  significant for father who had leukemia.  Mother had lung cancer and a smoker.  Paternal aunt had breast cancer.  No ovarian cancer reported.  MEDICAL HISTORY:  Past Medical History:  Diagnosis Date  . Diabetes mellitus without complication (HCC)    on insulin pump  . Dysrhythmia   . 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 Family history of breast cancer   . GERD (gastroesophageal reflux disease)   . History of blood transfusion   . History of bronchitis   . History of chemotherapy   . History of urinary tract infection   . Hyperlipidemia   . Hypertension   . Low serum vitamin D   . Ovarian cancer ([place]) 05/09/2016  . Shingles     SURGICAL HISTORY: Past Surgical History:  Procedure Laterality Date  . ABDOMINAL HYSTERECTOMY  10/14/2016  . CESAREAN SECTION    . DEBULKING N/A 10/14/2016   Procedure: DEBULKING;  Surgeon: Everitt Amber, MD;  Location: [facility];  Service: Gynecology;  Laterality: N/A;  . IR PARACENTESIS  06/01/2017  . LAPAROTOMY WITH STAGING N/A 10/14/2016   Procedure: LAPAROTOMY WITH OMENTECTOMY AND TUMOR DEBULGING;  Surgeon: Everitt Amber, MD;  Location: [facility];  Service: Gynecology;  Laterality: N/A;  . LUMBAR FUSION  08/21/15   L3-L4 Dr. Timmothy Euler  . OMENTECTOMY N/A 10/14/2016   Procedure: OMENTECTOMY;  Surgeon: Everitt Amber, MD;  Location: [facility];  Service: Gynecology;  Laterality: N/A;  . ROBOTIC ASSISTED TOTAL HYSTERECTOMY WITH BILATERAL SALPINGO OOPHERECTOMY Bilateral 10/14/2016   Procedure: XI ROBOTIC ASSISTED TOTAL LAPARSCOPIC  HYSTERECTOMY WITH BILATERAL SALPINGO OOPHORECTOMY;  Surgeon: Everitt Amber, MD;  Location: [facility];  Service: Gynecology;  Laterality: Bilateral;    SOCIAL HISTORY: Social History   Socioeconomic History  . Marital status: Married    Spouse name: Gershon Mussel  . Number of children: 2  . Years of education: Not on file  . Highest education level: Not on file  Occupational History  . Occupation: retired  Scientific laboratory technician  .  Financial resource strain: Not on file  . Food insecurity:    Worry: Not on file    Inability: Not on file  . Transportation needs:    Medical: Not on file    Non-medical: Not on file  Tobacco Use  . Smoking status: Never Smoker  . Smokeless tobacco: Never Used  Substance and Sexual Activity  . Alcohol use: No  . Drug use: No  . Sexual activity: Not on file    Comment: married  Lifestyle  . Physical activity:    Days per week: Not on file    Minutes per session: Not on file  . Stress: Not on file  Relationships  . Social connections:    Talks on phone: Not on file    Gets together: Not on file    Attends religious service: Not on file    Active member of club or organization: Not on file    Attends meetings of clubs or organizations: Not on file    Relationship status: Not on file  . Intimate partner violence:    Fear of current or ex partner: Not on file    Emotionally abused: Not on file    Physically abused: Not on file    Forced sexual activity: Not on file  Other Topics Concern  . Not on file  Social History Narrative  . Not on file    FAMILY HISTORY: Family History  Problem Relation Age of Onset  . Lung cancer Mother        smoker; dx in her 50s  . Leukemia Father   . Diabetes Paternal Grandmother   . Heart attack Paternal Grandmother   . Diabetes Paternal Grandfather   . Breast cancer Paternal Aunt        dx in her 34s-30s  . Leukemia Paternal Uncle   . Heart attack Maternal Grandfather   . Breast cancer Cousin        maternal first cousin    ALLERGIES:  is allergic to avandia [rosiglitazone]; micronase [glyburide]; and actos [pioglitazone].  MEDICATIONS:  Current Outpatient Medications  Medication Sig Dispense Refill  . atorvastatin (LIPITOR) 40 MG tablet Take 1 tablet (40 mg total) by mouth daily. 90 tablet 3  . benazepril (LOTENSIN) 10 MG tablet Take 0.5 tablets (5 mg total) by mouth daily. 45 tablet 2  . Biotin 1000 MCG tablet Take 1,000 mcg  by mouth 3 (three) times daily.    Marland Kitchen dexamethasone (DECADRON) 4 MG tablet Take 2 tablets (8 mg total) by mouth 2 (two) times daily. Start the day before Taxotere. Then daily after chemo for 2 days, every 3 weeks 30 tablet 1  . furosemide (LASIX) 40 MG tablet Take 1 tablet (40 mg total) by mouth 2 (two) times daily. 60 tablet 9  . glucose blood (ONE TOUCH ULTRA TEST) test strip USE TO CHECK BLOOD SUGAR UP TO 5 TIMES A DAY 500 each 3  . HYDROcodone-acetaminophen (NORCO) 10-325 MG tablet Take 1 tablet by mouth every 4 (four) hours as needed. 90 tablet 0  . insulin lispro (HUMALOG) 100 UNIT/ML injection USE IN INSULIN PUMP AS  DIRECTED. MAX DAILY DOSE OF 110 UNITS PER DAY 110 mL 0  . lidocaine-prilocaine (EMLA) cream Apply a quarter size amount to port site 1 hour prior to chemo. Do not rub in. Cover with plastic wrap. 30 g 3  . loperamide (IMODIUM A-D) 2 MG tablet Take 1 tablet (2 mg total) by mouth 4 (four) times daily as needed for diarrhea or loose stools. 30 tablet 0  . loratadine (CLARITIN) 10 MG tablet Take 10 mg by mouth daily.    . magnesium oxide (MAG-OX) 400 (241.3 Mg) MG tablet Take 1 tablet (400 mg total) by mouth 3 (three) times daily. 90 tablet 3  . metFORMIN (GLUCOPHAGE) 1000 MG tablet TAKE 1 TABLET BY MOUTH TWICE DAILY WITH A MEAL 180 tablet 3  . Multiple Vitamin (MULTIVITAMIN WITH MINERALS) TABS Take 1 tablet by mouth daily.    . multivitamin-lutein (OCUVITE-LUTEIN) CAPS capsule Take 1 capsule by mouth daily.    Marland Kitchen omeprazole (PRILOSEC) 40 MG capsule Take 1 capsule (40 mg total) daily by mouth. 90 capsule 3  . ondansetron (ZOFRAN) 8 MG tablet Take 1 tablet (8 mg total) by mouth every 8 (eight) hours as needed for nausea or vomiting. 90 tablet 3  . polyethylene glycol (MIRALAX / GLYCOLAX) packet Take 17 g by mouth daily as needed for mild constipation.     . prochlorperazine (COMPAZINE) 10 MG tablet Take 1 tablet (10 mg total) by mouth every 6 (six) hours as needed for nausea or vomiting.  60 tablet 9  . senna (SENOKOT) 8.6 MG TABS tablet Take 1 tablet (8.6 mg total) by mouth at bedtime as needed for moderate constipation. 120 each 0  . vitamin B-12 (CYANOCOBALAMIN) 1000 MCG tablet Take 1,000 mcg by mouth daily.    . vitamin E (VITAMIN E) 400 UNIT capsule Take 400 Units by mouth daily.     No current facility-administered medications for this visit.     REVIEW OF SYSTEMS:   Constitutional: Denies fevers, chills or abnormal night sweats.  Positive for fatigue. Eyes: Denies blurriness of vision, double vision or watery eyes Ears, nose, mouth, throat, and face: Denies mucositis or sore throat Respiratory: Denies cough, dyspnea or wheezes Cardiovascular: Denies palpitation, chest discomfort.  Positive for lower extremity swelling. Gastrointestinal: Positive for occasional nausea and diarrhea and constipation. Skin: Denies abnormal skin rashes Lymphatics: Denies new lymphadenopathy or easy bruising Neurological:Denies numbness, tingling or new weaknesses Behavioral/Psych: Mood is stable, no new changes  All other systems were reviewed with the patient and are negative.  PHYSICAL EXAMINATION: ECOG PERFORMANCE STATUS: 1 - Symptomatic but completely ambulatory  Vitals:   07/28/18 1402  BP: (!) 127/46  Pulse: (!) 117  Resp: 18  SpO2: 94%   Filed Weights   07/28/18 1402  Weight: 222 lb 9.6 oz (101 kg)    GENERAL:alert, no distress and comfortable SKIN: skin color, texture, turgor are normal, no rashes or significant lesions EYES: normal, conjunctiva are pink and non-injected, sclera clear OROPHARYNX:no exudate, no erythema and lips, buccal mucosa, and tongue normal  NECK: supple, thyroid normal size, non-tender, without nodularity LYMPH:  no palpable lymphadenopathy in the cervical, axillary or inguinal LUNGS: clear to auscultation and percussion with normal breathing effort HEART: regular rate & rhythm and no murmurs and no lower extremity edema ABDOMEN: Soft,  distended.  There is a 2.5 cm skin nodule at the site of prior paracentesis in the right mid quadrant. Musculoskeletal:no cyanosis of digits and no clubbing  PSYCH: alert & oriented x 3  with fluent speech NEURO: no focal motor/sensory deficits Extremities: There is 1+ edema in the right lower extremity.  Trace edema in the left lower extremity.  LABORATORY DATA:  I have reviewed the data as listed Lab Results  Component Value Date   WBC 9.4 07/16/2018   HGB 9.1 (L) 07/16/2018   HCT 28.9 (L) 07/16/2018   MCV 108.6 (H) 07/16/2018   PLT 194 07/16/2018     Chemistry      Component Value Date/Time   NA 143 07/16/2018 0824   NA 139 11/02/2017 0756   K 4.9 07/16/2018 0824   K 4.7 11/02/2017 0756   CL 104 07/16/2018 0824   CO2 24 07/16/2018 0824   CO2 26 11/02/2017 0756   BUN 33 (H) 07/16/2018 0824   BUN 23.7 11/02/2017 0756   CREATININE 1.28 (H) 07/16/2018 0824   CREATININE 0.9 11/02/2017 0756      Component Value Date/Time   CALCIUM 10.0 07/16/2018 0824   CALCIUM 9.4 11/02/2017 0756   ALKPHOS 103 07/16/2018 0824   ALKPHOS 108 11/02/2017 0756   AST 13 (L) 07/16/2018 0824   AST 18 11/02/2017 0756   ALT 12 07/16/2018 0824   ALT 23 11/02/2017 0756   BILITOT 0.3 07/16/2018 0824   BILITOT 0.24 11/02/2017 0756       RADIOGRAPHIC STUDIES: I have personally reviewed the radiological images as listed and agreed with the findings in the report. Ct Abdomen Pelvis W Contrast  Result Date: 07/01/2018 CLINICAL DATA:  Restaging of ovarian cancer EXAM: CT ABDOMEN AND PELVIS WITH CONTRAST TECHNIQUE: Multidetector CT imaging of the abdomen and pelvis was performed using the standard protocol following bolus administration of intravenous contrast. CONTRAST:  5m OMNIPAQUE IOHEXOL 300 MG/ML  SOLN COMPARISON:  01/21/2018 FINDINGS: Lower chest: Unremarkable Hepatobiliary: Linear calcifications along the upper margin of the caudate lobe similar to prior. Gallbladder unremarkable. No well-defined  intraparenchymal mass. No biliary dilatation. Pancreas: Unremarkable Spleen: Punctate calcifications indicating old granulomatous disease. Adrenals/Urinary Tract: Adrenal glands normal. 0.4 cm left kidney lower pole nonobstructive renal calculus. Stomach/Bowel: On image 34/5 there is a somewhat masslike appearance within or along the hepatic flexure of the colon. This may simply be due to a confluence of fluid as there is no mass in this vicinity the prior exam the tumor deposition along the colon wall is also a possibility. Vascular/Lymphatic: Aortoiliac atherosclerotic vascular disease. No retroperitoneal or pelvic adenopathy. Reproductive: The uterus and ovaries are absent. Other: Moderate amount of perihepatic and perisplenic ascites also tracking along the paracolic gutters, in the pelvis, along the loops of bowel as well as the gastric margins. Left upper quadrant band of omental tumor 1.8 by 3.7 cm, formerly 1.7 by 4.0 cm. Gastrohepatic ligament tumor nodularity in right upper quadrant tumor nodularity are roughly stable compared to previous exam. Mild subcutaneous inflammation along the pannus. Musculoskeletal: Postoperative findings in the lumbar spine, not changed. IMPRESSION: 1. Mixed appearance but overall progression, with the left upper quadrant omental tumor slightly improved but with some potential new deposition of tumor along the hepatic flexure margin. There is also new moderate ascites which must be considered suspicious for malignant ascites/peritoneal spread. 2. Other imaging findings of potential clinical significance: Nonobstructive left nephrolithiasis. Aortic Atherosclerosis (ICD10-I70.0). Mild subcutaneous edema along the sixth anterior abdominal wall potentially from mild panniculitis. Electronically Signed   By: WVan ClinesM.D.   On: 07/01/2018 07:30   [place]acent[place]  Result Date: 07/08/2018 INDICATION: Ovarian cancer, malignant ascites EXAM: ULTRASOUND GUIDED THERAPEUTIC  PARACENTESIS MEDICATIONS: None. COMPLICATIONS: None immediate. PROCEDURE: Procedure, benefits, and risks of procedure were discussed with patient. Written informed consent for procedure was obtained. Time out protocol followed. Adequate collection of ascites localized by ultrasound in RIGHT lower quadrant. Skin prepped and draped in usual sterile fashion. Skin and soft tissues anesthetized with 10 mL of 1% lidocaine. 5 [place] Yueh catheter placed into peritoneal cavity. 4 L of yellow ascitic fluid aspirated by vacuum bottle suction. Procedure tolerated well by patient without immediate complication. FINDINGS: As above IMPRESSION: Successful ultrasound-guided paracentesis yielding 4 liters of peritoneal fluid. Electronically Signed   By: Lavonia Dana M.D.   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 -Underwent 7 cycles of chemotherapy with carboplatin and paclitaxel from 06/01/2016 through 09/18/2016. -Germline mutation testing on 06/25/2016 was negative. - Underwent robotic assisted tumor debulking surgery on 10/14/2016 by Dr. Denman George. -3 cycles of chemotherapy with carboplatin and paclitaxel from 11/06/2016 through 12/25/2016. - Second line therapy with Doxil and bevacizumab from 05/25/2017 through 07/07/2017. - Third line therapy with gemcitabine from 07/30/2017 through December 2018, gemcitabine and cisplatin day 1 and day 8 q. 21 days from 11/02/2017, 7 cycles completed on 06/14/2018, with CT scan on 07/01/2018 showing progression. -She underwent paracentesis, with 4 L removed on 07/08/2018.  There is a subcutaneous area of skin thickening at the paracentesis site, questionable for seeding. - Started on docetaxel 75 mg/m q. 21 days on 07/16/2018, tolerated reasonably well. -She will come back next Thursday for her  cycle 2.  I plan to repeat Ca1 25 prior to each cycle.  We plan to do CT scans after 2-3 cycles. -I have recommended foundation 1 testing for somatic BRCA1/2 and other mutations.  2.  Malignant ascites: -Last paracentesis was on 07/08/2018, 4 L removed. -She will continue Lasix 40 mg twice daily.  We will arrange for another paracentesis if she has fluid recommendation again.  3.  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  Will closely monitor it.  4.  Diabetes type 2: -She is on insulin pump and her sugars are fairly well controlled.  5.  Cancer related pain: -She has abdominal pains and low back pain.  She is taking hydrocodone 10 mg every 4 hours as needed.  She requires more medication during the first week after treatment.  We will send a refill for it.  6.  Peripheral neuropathy: -She has on and off numbness in the toes.  No numbness in the hands reported.  She was prescribed gabapentin few months ago but she is not taking it.  Orders Placed This Encounter  Procedures  . Magnesium    Standing Status:   Future    Standing Expiration Date:   07/29/2019  . CBC with Differential/Platelet    Standing Status:   Future    Standing Expiration Date:   07/29/2019  . Comprehensive metabolic panel    Standing Status:   Future    Standing Expiration Date:   07/29/2019  . CA 125    Standing Status:   Future    Standing Expiration Date:   07/28/2019  . Magnesium    Standing Status:   Future    Standing Expiration Date:   07/29/2019  . CBC with Differential/Platelet    Standing Status:   Future    Standing Expiration Date:  07/29/2019  . Comprehensive metabolic panel    Standing Status:   Future    Standing Expiration Date:   07/29/2019  . CA 125    Standing Status:   Future    Standing Expiration Date:   07/28/2019    All questions were answered. The patient knows to call the clinic with any problems, questions or concerns.      Derek Jack, MD 07/28/2018 4:00  PM

## 2018-07-28 NOTE — Assessment & Plan Note (Addendum)
1.  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 -Underwent 7 cycles of chemotherapy with carboplatin and paclitaxel from 06/01/2016 through 09/18/2016. -Germline mutation testing on 06/25/2016 was negative. - Underwent robotic assisted tumor debulking surgery on 10/14/2016 by Dr. Denman George. -3 cycles of chemotherapy with carboplatin and paclitaxel from 11/06/2016 through 12/25/2016. - Second line therapy with Doxil and bevacizumab from 05/25/2017 through 07/07/2017. - Third line therapy with gemcitabine from 07/30/2017 through December 2018, gemcitabine and cisplatin day 1 and day 8 q. 21 days from 11/02/2017, 7 cycles completed on 06/14/2018, with CT scan on 07/01/2018 showing progression. -She underwent paracentesis, with 4 L removed on 07/08/2018.  There is a subcutaneous area of skin thickening at the paracentesis site, questionable for seeding. - Started on docetaxel 75 mg/m q. 21 days on 07/16/2018, tolerated reasonably well. -She will come back next Thursday for her cycle 2.  I plan to repeat Ca1 25 prior to each cycle.  We plan to do CT scans after 2-3 cycles. -I have recommended foundation 1 testing for somatic BRCA1/2 and other mutations.  2.  Malignant ascites: -Last paracentesis was on 07/08/2018, 4 L removed. -She will continue Lasix 40 mg twice daily.  We will arrange for another paracentesis if she has fluid recommendation again.  3.  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  Will closely monitor it.  4.  Diabetes type 2: -She is on insulin pump and her sugars are fairly well controlled.  5.  Cancer related pain: -She has abdominal pains and low back pain.  She is taking hydrocodone 10 mg every 4 hours as needed.  She requires more medication during the first week after treatment.  We will send a refill for it.  6.  Peripheral neuropathy: -She has on and off numbness in the toes.  No  numbness in the hands reported.  She was prescribed gabapentin few months ago but she is not taking it.

## 2018-07-29 ENCOUNTER — Encounter (HOSPITAL_COMMUNITY): Payer: Self-pay | Admitting: *Deleted

## 2018-07-29 NOTE — Progress Notes (Signed)
I spoke with Suanne Marker at [facility] pathology and ordered foundation one on [phone].  C48.1 Stage IIIC

## 2018-08-03 ENCOUNTER — Other Ambulatory Visit (HOSPITAL_COMMUNITY): Payer: Self-pay | Admitting: Hematology

## 2018-08-03 ENCOUNTER — Encounter: Payer: Self-pay | Admitting: Internal Medicine

## 2018-08-03 ENCOUNTER — Ambulatory Visit: Payer: BC Managed Care – PPO | Admitting: Internal Medicine

## 2018-08-03 VITALS — BP 140/60 | HR 101 | Ht 63.0 in

## 2018-08-03 DIAGNOSIS — E785 Hyperlipidemia, unspecified: Secondary | ICD-10-CM

## 2018-08-03 DIAGNOSIS — E118 Type 2 diabetes mellitus with unspecified complications: Secondary | ICD-10-CM | POA: Diagnosis not present

## 2018-08-03 DIAGNOSIS — Z9641 Presence of insulin pump (external) (internal): Secondary | ICD-10-CM | POA: Diagnosis not present

## 2018-08-03 LAB — POCT GLYCOSYLATED HEMOGLOBIN (HGB A1C): HEMOGLOBIN A1C: 5.1 % (ref 4.0–5.6)

## 2018-08-03 NOTE — Patient Instructions (Addendum)
Please continue: - Metformin 1000 mg 2x a day  Please use the following pump settings: - basal rates: 12 am: 2.35 - ICR: 1:3.5, except 1:4 with dinner - target:   12 am: 115-115   5 am: 100-100  8 pm: 100-100 - insulin sensitivity factor: 13 - Insulin on Board: 4 h  When you start Dexamethasone: - temporary basal  12 am: 2.35 >> 2.45 If sugars still high on the temporary basal rate, then decrease the insulin sensitivity factor to 10.  Please return in 3-4 months with your sugar log.

## 2018-08-03 NOTE — Progress Notes (Signed)
ctosamine   Patient ID: Ellen Hunt, female   DOB: October 06, 1951, 67 y.o.   MRN: [id number]   HPI: Ellen Hunt is a 67 y.o.-year-old female, initially referred by her PCP, Eustaquio Maize, MD, now returning for follow-up for DM2, dx 2002, insulin-dependent, uncontrolled, with complications (peripheral neuropathy, DR, CKD).  Last visit 4 months ago.  She is here with her husband who offers part of the history especially regarding her medical history and insulin doses.  Patient has a history of ovarian cancer with extraovarian peritoneal carcinomatosis.  She has ascites (recent paracentesis) and a high CA 125. She is now on her 4th ChTx. She has Dexamethasone for 4 days with every tx. sugars are higher (2x) during these treatments.  She has low hemoglobin A1c levels, not correlating with her blood sugars at home.  Of note, she has low blood counts and has had RBC transfusions.  Last hemoglobin was 9.1.  She is now on Humalog, changed from Novolog.  Last hemoglobin A1c was: Lab Results  Component Value Date   HGBA1C 5.7 (A) 04/02/2018   HGBA1C 5.5 09/29/2017   HGBA1C 6.2 (H) 10/09/2016  12/30/2017: HbA1c calculated from fructosamine is 5.9% (does not appear to be accurate as does not correlate with CBGs at home)  She is on insulin pump: -Previously Medtronic 722 - since 2012 -Now on Medtronic 630 G pump without CGM-since 10/2017  She is on: - Metformin 1000 mg twice a day  Pump settings: - basal rates: 12 am: 2.35 8 am: 2.20 >> 2.35 5 pm: 2.20 >> 2.35 - ICR: 1:4 >> 1:3.5 - target:   12 am: 115-115   5 am: 100-100  8 pm: 115-115 >> 100-100 - insulin sensitivity factor: 13 - Insulin on Board: 4 h TDD from basal insulin: 65% >> 50% >> 51% (54 units) >> 56% TDD from bolus insulin: 35% >> 50% >> 49% (52 units) >> 44% TDD 76.4 >> up to 100 units >> up to 120 units - changes infusion site: Every 2 days - Meter: One Touch Ultra 2  Pt checks her sugars 4 times a day- ave 206 +/- 70  >> 194 +/- 72 >> 133 +/-43.7: - am: 124-280, 318 >> 129-184, 300 >> 89-153, 173 - 2h after b'fast: n/c >> 77- 116 - before lunch: 113-215 >> 135-217 >> 58, 81- 140 - 2h after lunch: n/c >> 93-129, 160 - before dinner: 143-242, 376 >> 93- 206, 312, 340 >> 86-131 - 2h after dinner:153-215, >400 >> 157-274 >> 83-144, 172 - bedtime: n/c >> 64, 65, 84- 119, 255, 286 - nighttime:170-344 >> 168, 227, 396 >> 114-159 Lowest sugar was 93 >> 58; she has hypoglycemia awareness in the 70s.  No previous hypoglycemia admission.  She does have a glucagon kit at home.   Higher sugars: 396 >> 315.  Pt's meals are: - Breakfast: egg + raisin bread + sometimes bacon - Lunch: varies, may eat out; tomato + cottage cheese - Dinner: meat + veggies + starch - Snacks: bananas, oranges, PB, icecream Caffeine-free diet coke, Sprite 0  -+ CKD, last BUN/creatinine:  Lab Results  Component Value Date   BUN 33 (H) 07/16/2018   BUN 26 (H) 07/01/2018   CREATININE 1.28 (H) 07/16/2018   CREATININE 1.29 (H) 07/01/2018  On benazepril. -+ Dyslipidemia; last set of lipids: Lab Results  Component Value Date   CHOL 128 04/07/2018   HDL 37 (L) 04/07/2018   LDLCALC 53 04/07/2018   LDLDIRECT 57 03/29/2015  TRIG 191 (H) 04/07/2018   CHOLHDL 3.5 04/07/2018  On Lipitor. - + Numbness and tingling in her feet.  She developed swelling from Neurontin so she is now off. - Last eye exam: Summer 2018: + DR reportedly, + cataract  ROS: Constitutional: + weight gain/no weight loss, no fatigue, no subjective hyperthermia, no subjective hypothermia Eyes: no blurry vision, no xerophthalmia ENT: no sore throat, no nodules palpated in throat, no dysphagia, no odynophagia, no hoarseness Cardiovascular: no CP/no SOB/no palpitations/no leg swelling Respiratory: no cough/no SOB/no wheezing Gastrointestinal: no N/no V/no D/no C/no acid reflux Musculoskeletal: no muscle aches/no joint aches Skin: no rashes, no hair loss Neurological:  no tremors/+ numbness/+ tingling/no dizziness  I reviewed pt's medications, allergies, PMH, social hx, family hx, and changes were documented in the history of present illness. Otherwise, unchanged from my initial visit note.  Past Medical History:  Diagnosis Date  . Diabetes mellitus without complication (HCC)    on insulin pump  . Dysrhythmia   . 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[place]) 05/09/2016  . Family history of breast cancer   . GERD (gastroesophageal reflux disease)   . History of blood transfusion   . History of bronchitis   . History of chemotherapy   . History of urinary tract infection   . Hyperlipidemia   . Hypertension   . Low serum vitamin D   . Ovarian cancer ([place]) 05/09/2016  . Shingles    Past Surgical History:  Procedure Laterality Date  . ABDOMINAL HYSTERECTOMY  10/14/2016  . CESAREAN SECTION    . DEBULKING N/A 10/14/2016   Procedure: DEBULKING;  Surgeon: Everitt Amber, MD;  Location: [facility];  Service: Gynecology;  Laterality: N/A;  . IR PARACENTESIS  06/01/2017  . LAPAROTOMY WITH STAGING N/A 10/14/2016   Procedure: LAPAROTOMY WITH OMENTECTOMY AND TUMOR DEBULGING;  Surgeon: Everitt Amber, MD;  Location: [facility];  Service: Gynecology;  Laterality: N/A;  . LUMBAR FUSION  08/21/15   L3-L4 Dr. Timmothy Euler  . OMENTECTOMY N/A 10/14/2016   Procedure: OMENTECTOMY;  Surgeon: Everitt Amber, MD;  Location: [facility];  Service: Gynecology;  Laterality: N/A;  . ROBOTIC ASSISTED TOTAL HYSTERECTOMY WITH BILATERAL SALPINGO OOPHERECTOMY Bilateral 10/14/2016   Procedure: XI ROBOTIC ASSISTED TOTAL LAPARSCOPIC  HYSTERECTOMY WITH BILATERAL SALPINGO OOPHORECTOMY;  Surgeon: Everitt Amber, MD;  Location: [facility];  Service: Gynecology;  Laterality: Bilateral;   Social History   Socioeconomic History  . Marital status: Married    Spouse name: Gershon Mussel  . Number of children: 2  Social Needs  Occupational History  . Occupation: Retired; [employer]  Tobacco Use  . Smoking status:  Never Smoker  . Smokeless tobacco: Never Used  Substance and Sexual Activity  . Alcohol use: No  . Drug use: No  . Sexual activity: Not on file    Comment: married   Current Outpatient Medications on File Prior to Visit  Medication Sig Dispense Refill  . atorvastatin (LIPITOR) 40 MG tablet Take 1 tablet (40 mg total) by mouth daily. 90 tablet 3  . benazepril (LOTENSIN) 10 MG tablet Take 0.5 tablets (5 mg total) by mouth daily. 45 tablet 2  . Biotin 1000 MCG tablet Take 1,000 mcg by mouth 3 (three) times daily.    Marland Kitchen dexamethasone (DECADRON) 4 MG tablet Take 2 tablets (8 mg total) by mouth 2 (two) times daily. Start the day before Taxotere. Then daily after chemo for 2 days, every 3 weeks 30 tablet 1  . furosemide (LASIX) 40 MG tablet Take  1 tablet (40 mg total) by mouth 2 (two) times daily. 60 tablet 9  . glucose blood (ONE TOUCH ULTRA TEST) test strip USE TO CHECK BLOOD SUGAR UP TO 5 TIMES A DAY 500 each 3  . HYDROcodone-acetaminophen (NORCO) 10-325 MG tablet Take 1 tablet by mouth every 4 (four) hours as needed. 90 tablet 0  . insulin lispro (HUMALOG) 100 UNIT/ML injection USE IN INSULIN PUMP AS DIRECTED. MAX DAILY DOSE OF 110 UNITS PER DAY 110 mL 0  . lidocaine-prilocaine (EMLA) cream Apply a quarter size amount to port site 1 hour prior to chemo. Do not rub in. Cover with plastic wrap. 30 g 3  . loperamide (IMODIUM A-D) 2 MG tablet Take 1 tablet (2 mg total) by mouth 4 (four) times daily as needed for diarrhea or loose stools. 30 tablet 0  . loratadine (CLARITIN) 10 MG tablet Take 10 mg by mouth daily.    . magnesium oxide (MAG-OX) 400 (241.3 Mg) MG tablet Take 1 tablet (400 mg total) by mouth 3 (three) times daily. 90 tablet 3  . metFORMIN (GLUCOPHAGE) 1000 MG tablet TAKE 1 TABLET BY MOUTH TWICE DAILY WITH A MEAL 180 tablet 3  . Multiple Vitamin (MULTIVITAMIN WITH MINERALS) TABS Take 1 tablet by mouth daily.    . multivitamin-lutein (OCUVITE-LUTEIN) CAPS capsule Take 1 capsule by mouth  daily.    Marland Kitchen omeprazole (PRILOSEC) 40 MG capsule Take 1 capsule (40 mg total) daily by mouth. 90 capsule 3  . ondansetron (ZOFRAN) 8 MG tablet Take 1 tablet (8 mg total) by mouth every 8 (eight) hours as needed for nausea or vomiting. 90 tablet 3  . polyethylene glycol (MIRALAX / GLYCOLAX) packet Take 17 g by mouth daily as needed for mild constipation.     . prochlorperazine (COMPAZINE) 10 MG tablet Take 1 tablet (10 mg total) by mouth every 6 (six) hours as needed for nausea or vomiting. 60 tablet 9  . senna (SENOKOT) 8.6 MG TABS tablet Take 1 tablet (8.6 mg total) by mouth at bedtime as needed for moderate constipation. 120 each 0  . vitamin B-12 (CYANOCOBALAMIN) 1000 MCG tablet Take 1,000 mcg by mouth daily.    . vitamin E (VITAMIN E) 400 UNIT capsule Take 400 Units by mouth daily.     No current facility-administered medications on file prior to visit.    Allergies  Allergen Reactions  . Avandia [Rosiglitazone] Other (See Comments)    Legs swelled  . Micronase [Glyburide] Swelling  . Actos [Pioglitazone] Other (See Comments)    Edema / leg swelling   Family History  Problem Relation Age of Onset  . Lung cancer Mother        smoker; dx in her 27s  . Leukemia Father   . Diabetes Paternal Grandmother   . Heart attack Paternal Grandmother   . Diabetes Paternal Grandfather   . Breast cancer Paternal Aunt        dx in her 42s-30s  . Leukemia Paternal Uncle   . Heart attack Maternal Grandfather   . Breast cancer Cousin        maternal first cousin    PE: BP 140/60   Pulse (!) 101   Ht 5' 3" (1.6 m)   SpO2 95%   BMI 39.43 kg/m  Wt Readings from Last 3 Encounters:  07/28/18 222 lb 9.6 oz (101 kg)  07/16/18 224 lb 6.4 oz (101.8 kg)  07/07/18 227 lb 12.8 oz (103.3 kg)   Constitutional: overweight, in NAD Eyes:  PERRLA, EOMI, no exophthalmos ENT: moist mucous membranes, no thyromegaly, no cervical lymphadenopathy Cardiovascular: tachycardia, RR, No MRG, + B LE  swelling Respiratory: CTA B Gastrointestinal: abdomen soft, NT, ND, BS+ Musculoskeletal: no deformities, strength intact in all 4 Skin: moist, warm, no rashes Neurological: no tremor with outstretched hands, DTR normal in all 4  ASSESSMENT: 1. DM1, uncontrolled, with complications - Peripheral neuropathy - CKD - DR  2. HL  PLAN:  1. Patient with long-standing, fairly well-controlled type 2 diabetes, on insulin pump therapy.  Her sugars were higher during chemotherapy as she was receiving dexamethasone along with her treatments.  Afterwards, sugars improved, with only slightly higher blood sugars in the morning but otherwise sugars close to goal later in the day.  At last visit, sugars were again improved, especially in the morning after increasing her basal rates at the previous visit.  She also adjusted down her insulin to carb ratio, which also help.  However, sugars were increasing throughout the day and we decrease her insulin to carb ratio further.  We also increased her basal rate later in the day.  Since sugars were high after dinner I decreased her CBG target after 8 PM.  We continued metformin. -At this visit, we reviewed her glucometer and pump downloads along with patient and her husband.  In the last 3 weeks, her sugars are mostly at goal, with only occasional hyperglycemic spikes but several low blood sugars after dinner.  She had only one one blood sugar at 58 before lunch. -At this visit, we discussed about increasing her insulin to carb ratio with dinner to avoid low blood sugars after she eats dinner -However, reviewing sugars from the time of her chemotherapy treatment (she has 1 every 3 weeks), they have increased significantly and especially before meals.  Therefore, I do not feel that her insulin to carb ratios will need to be changed during her dexamethasone treatments, but I did advise her to use a temporary basal rate that is higher at that time.  I only changed it in a  conservative way, but we discussed that she may need to go even higher if needed.  We also discussed about decreasing her insulin sensitivity factor to 10 if still needed after changing the basal rates. -We will continue metformin, which she tolerates well -No other changes are needed for now - I suggested:  Patient Instructions  Please continue: - Metformin 1000 mg 2x a day  Please use the following pump settings: - basal rates: 12 am: 2.35 - ICR: 1:3.5, except 1:4 with dinner - target:   12 am: 115-115   5 am: 100-100  8 pm: 100-100 - insulin sensitivity factor: 13 - Insulin on Board: 4 h  When you start Dexamethasone: - temporary basal  12 am: 2.35 >> 2.45 If sugars still high on the temporary basal rate, then decrease the insulin sensitivity factor to 10.  Please return in 3-4 months with your sugar log.   - today, HbA1c is 5.1% (lower) - continue checking sugars at different times of the day - check 4x a day, rotating checks - advised for yearly eye exams >> she is not UTD - she had the flu shot already this season - Return to clinic in 3-4 mo with sugar log    2. HL - Reviewed latest lipid panel from 04/2018: LDL at goal, triglycerides high, HDL low Lab Results  Component Value Date   CHOL 128 04/07/2018   HDL 37 (L) 04/07/2018  LDLCALC 53 04/07/2018   LDLDIRECT 57 03/29/2015   TRIG 191 (H) 04/07/2018   CHOLHDL 3.5 04/07/2018  - Continues Lipitor without side effects.  - time spent with the patient and her husband: 40 min, of which >50% was spent in reviewing her pump downloads, discussing her hypo- and hyper-glycemic episodes, reviewing previous labs and pump settings and developing a plan to avoid hypo- and hyper-glycemia.   Philemon Kingdom, MD PhD [facility]

## 2018-08-05 ENCOUNTER — Ambulatory Visit (HOSPITAL_COMMUNITY): Payer: Medicare Other

## 2018-08-05 ENCOUNTER — Inpatient Hospital Stay (HOSPITAL_COMMUNITY): Payer: Medicare Other

## 2018-08-05 ENCOUNTER — Encounter (HOSPITAL_COMMUNITY): Payer: Self-pay

## 2018-08-05 ENCOUNTER — Inpatient Hospital Stay (HOSPITAL_COMMUNITY): Payer: Medicare Other | Attending: Hematology

## 2018-08-05 VITALS — BP 119/49 | HR 70 | Temp 98.1°F | Resp 18 | Wt 231.8 lb

## 2018-08-05 DIAGNOSIS — Z5111 Encounter for antineoplastic chemotherapy: Secondary | ICD-10-CM | POA: Diagnosis not present

## 2018-08-05 DIAGNOSIS — Z79899 Other long term (current) drug therapy: Secondary | ICD-10-CM | POA: Diagnosis not present

## 2018-08-05 DIAGNOSIS — Z90722 Acquired absence of ovaries, bilateral: Secondary | ICD-10-CM | POA: Insufficient documentation

## 2018-08-05 DIAGNOSIS — C786 Secondary malignant neoplasm of retroperitoneum and peritoneum: Secondary | ICD-10-CM | POA: Insufficient documentation

## 2018-08-05 DIAGNOSIS — C569 Malignant neoplasm of unspecified ovary: Secondary | ICD-10-CM

## 2018-08-05 DIAGNOSIS — E1122 Type 2 diabetes mellitus with diabetic chronic kidney disease: Secondary | ICD-10-CM | POA: Insufficient documentation

## 2018-08-05 DIAGNOSIS — E1142 Type 2 diabetes mellitus with diabetic polyneuropathy: Secondary | ICD-10-CM | POA: Diagnosis not present

## 2018-08-05 DIAGNOSIS — Z9221 Personal history of antineoplastic chemotherapy: Secondary | ICD-10-CM | POA: Diagnosis not present

## 2018-08-05 DIAGNOSIS — G893 Neoplasm related pain (acute) (chronic): Secondary | ICD-10-CM | POA: Diagnosis not present

## 2018-08-05 DIAGNOSIS — C578 Malignant neoplasm of overlapping sites of female genital organs: Secondary | ICD-10-CM | POA: Diagnosis not present

## 2018-08-05 DIAGNOSIS — I700 Atherosclerosis of aorta: Secondary | ICD-10-CM | POA: Insufficient documentation

## 2018-08-05 DIAGNOSIS — Z90710 Acquired absence of both cervix and uterus: Secondary | ICD-10-CM | POA: Diagnosis not present

## 2018-08-05 DIAGNOSIS — C481 Malignant neoplasm of specified parts of peritoneum: Secondary | ICD-10-CM

## 2018-08-05 DIAGNOSIS — R180 Malignant ascites: Secondary | ICD-10-CM | POA: Insufficient documentation

## 2018-08-05 DIAGNOSIS — N189 Chronic kidney disease, unspecified: Secondary | ICD-10-CM | POA: Insufficient documentation

## 2018-08-05 DIAGNOSIS — I2510 Atherosclerotic heart disease of native coronary artery without angina pectoris: Secondary | ICD-10-CM | POA: Diagnosis not present

## 2018-08-05 DIAGNOSIS — Z9641 Presence of insulin pump (external) (internal): Secondary | ICD-10-CM | POA: Insufficient documentation

## 2018-08-05 LAB — CBC WITH DIFFERENTIAL/PLATELET
BASOS PCT: 0 %
Basophils Absolute: 0 10*3/uL (ref 0.0–0.1)
EOS ABS: 0 10*3/uL (ref 0.0–0.7)
Eosinophils Relative: 0 %
HCT: 27.3 % — ABNORMAL LOW (ref 36.0–46.0)
HEMOGLOBIN: 8.5 g/dL — AB (ref 12.0–15.0)
Lymphocytes Relative: 3 %
Lymphs Abs: 0.5 10*3/uL — ABNORMAL LOW (ref 0.7–4.0)
MCH: 33.7 pg (ref 26.0–34.0)
MCHC: 31.1 g/dL (ref 30.0–36.0)
MCV: 108.3 fL — ABNORMAL HIGH (ref 78.0–100.0)
MONOS PCT: 5 %
Monocytes Absolute: 0.8 10*3/uL (ref 0.1–1.0)
NEUTROS PCT: 92 %
Neutro Abs: 13.9 10*3/uL — ABNORMAL HIGH (ref 1.7–7.7)
Platelets: 176 10*3/uL (ref 150–400)
RBC: 2.52 MIL/uL — ABNORMAL LOW (ref 3.87–5.11)
RDW: 14.8 % (ref 11.5–15.5)
WBC: 15.2 10*3/uL — AB (ref 4.0–10.5)

## 2018-08-05 LAB — COMPREHENSIVE METABOLIC PANEL
ALK PHOS: 76 U/L (ref 38–126)
ALT: 13 U/L (ref 0–44)
ANION GAP: 14 (ref 5–15)
AST: 23 U/L (ref 15–41)
Albumin: 3.4 g/dL — ABNORMAL LOW (ref 3.5–5.0)
BUN: 32 mg/dL — ABNORMAL HIGH (ref 8–23)
CALCIUM: 9.4 mg/dL (ref 8.9–10.3)
CHLORIDE: 101 mmol/L (ref 98–111)
CO2: 25 mmol/L (ref 22–32)
Creatinine, Ser: 1.31 mg/dL — ABNORMAL HIGH (ref 0.44–1.00)
GFR calc non Af Amer: 42 mL/min — ABNORMAL LOW (ref >60)
GFR, EST AFRICAN AMERICAN: 48 mL/min — AB (ref >60)
Glucose, Bld: 120 mg/dL — ABNORMAL HIGH (ref 70–99)
Potassium: 4.7 mmol/L (ref 3.5–5.1)
SODIUM: 140 mmol/L (ref 135–145)
Total Bilirubin: 0.4 mg/dL (ref 0.3–1.2)
Total Protein: 6.1 g/dL — ABNORMAL LOW (ref 6.5–8.1)

## 2018-08-05 LAB — MAGNESIUM: MAGNESIUM: 1.9 mg/dL (ref 1.7–2.4)

## 2018-08-05 MED ORDER — SODIUM CHLORIDE 0.9 % IV SOLN
Freq: Once | INTRAVENOUS | Status: AC
  Administered 2018-08-05: 12:00:00 via INTRAVENOUS

## 2018-08-05 MED ORDER — HEPARIN SOD (PORK) LOCK FLUSH 100 UNIT/ML IV SOLN
500.0000 [IU] | Freq: Once | INTRAVENOUS | Status: AC | PRN
  Administered 2018-08-05: 500 [IU]

## 2018-08-05 MED ORDER — SODIUM CHLORIDE 0.9% FLUSH
10.0000 mL | INTRAVENOUS | Status: DC | PRN
  Administered 2018-08-05: 10 mL
  Filled 2018-08-05: qty 10

## 2018-08-05 MED ORDER — SODIUM CHLORIDE 0.9 % IV SOLN
75.0000 mg/m2 | Freq: Once | INTRAVENOUS | Status: AC
  Administered 2018-08-05: 160 mg via INTRAVENOUS
  Filled 2018-08-05: qty 16

## 2018-08-05 MED ORDER — PEGFILGRASTIM 6 MG/0.6ML ~~LOC~~ PSKT
6.0000 mg | PREFILLED_SYRINGE | Freq: Once | SUBCUTANEOUS | Status: AC
  Administered 2018-08-05: 6 mg via SUBCUTANEOUS
  Filled 2018-08-05: qty 0.6

## 2018-08-05 MED ORDER — SODIUM CHLORIDE 0.9 % IV SOLN
10.0000 mg | Freq: Once | INTRAVENOUS | Status: AC
  Administered 2018-08-05: 10 mg via INTRAVENOUS
  Filled 2018-08-05: qty 1

## 2018-08-05 NOTE — Progress Notes (Signed)
[place] reviewed with Dr. Delton Coombes and pt approved for chemo tx today per MD                       Riki Rusk tolerated Taxotere infusion with Neulasta on-pro application well without complaints or incident. Neulasta on-pro applied to pt's left arm with green indicator light flashing. VSS upon discharge. Pt discharged self ambulatory in satisfactory condition accompanied by her husband

## 2018-08-05 NOTE — Patient Instructions (Addendum)
[facility] Discharge Instructions for Patients Receiving Chemotherapy   Beginning January 23rd 2017 lab work for the Cancer Center will be done in the  Main lab at [place] on 1st floor. If you have a lab appointment with the Cancer Center please come in thru the  Main Entrance and check in at the main information desk   Today you received the following chemotherapy agents Taxotere as well as Neulasta on pro. Follow-up as scheduled. Call clinic for any questions or concerns  To help prevent nausea and vomiting after your treatment, we encourage you to take your nausea medication   If you develop nausea and vomiting, or diarrhea that is not controlled by your medication, call the clinic.  The clinic phone number is [phone]. Office hours are Monday-Friday 8:30am-5:00pm.  BELOW ARE SYMPTOMS THAT SHOULD BE REPORTED IMMEDIATELY:  *FEVER GREATER THAN 101.0 F  *CHILLS WITH OR WITHOUT FEVER  NAUSEA AND VOMITING THAT IS NOT CONTROLLED WITH YOUR NAUSEA MEDICATION  *UNUSUAL SHORTNESS OF BREATH  *UNUSUAL BRUISING OR BLEEDING  TENDERNESS IN MOUTH AND THROAT WITH OR WITHOUT PRESENCE OF ULCERS  *URINARY PROBLEMS  *BOWEL PROBLEMS  UNUSUAL RASH Items with * indicate a potential emergency and should be followed up as soon as possible. If you have an emergency after office hours please contact your primary care physician or go to the nearest emergency department.  Please call the clinic during office hours if you have any questions or concerns.   You may also contact the Patient Navigator at [phone] should you have any questions or need assistance in obtaining follow up care.      Resources For Cancer Patients and their Caregivers ? American Cancer Society: Can assist with transportation, wigs, general needs, runs Look Good Feel Better.        1-888-227-6333 ? Cancer Care: Provides financial assistance, online support groups, medication/co-pay  assistance.  1-800-813-HOPE (4673) ? [facility] Assists [place] cancer patients and their families through emotional , educational and financial support.  336-427-4357 ? [place] DSS Where to apply for food stamps, Medicaid and utility assistance. 336-342-1394 ? [facility]: Transportation to medical appointments. 336-347-2287 ? Social Security Administration: May apply for disability if have a Stage IV cancer. 336-342-7796 1-800-772-1213 ? [place] Aging, Disability and Transit Services: Assists with nutrition, care and transit needs. 336-349-2343         

## 2018-08-06 ENCOUNTER — Ambulatory Visit (HOSPITAL_COMMUNITY): Payer: Medicare Other

## 2018-08-06 LAB — CA 125: CANCER ANTIGEN (CA) 125: 2367 U/mL — AB (ref 0.0–38.1)

## 2018-08-11 ENCOUNTER — Encounter (HOSPITAL_COMMUNITY): Payer: Self-pay

## 2018-08-11 ENCOUNTER — Encounter (HOSPITAL_COMMUNITY): Payer: Self-pay | Admitting: Hematology

## 2018-08-12 ENCOUNTER — Telehealth (HOSPITAL_COMMUNITY): Payer: Self-pay | Admitting: *Deleted

## 2018-08-12 NOTE — Telephone Encounter (Signed)
-----   Message from Glennie Isle, NP-C sent at 08/12/2018  2:59 PM EDT ----- Tell her the cancer number goes up after the [place] few cycles then it starts to come down but to keep her appointment and we will see her then.  ----- Message ----- From: Farley Ly, LPN Sent: 00/76/2263  11:33 AM EDT To: Derek Jack, MD, Glennie Isle, NP-C  Pt sent a message wanting to know what her CEA numbers are and what we need to do. Can you please review her labs and either call the pt and discuss this with her or let me know what you would like me have her do.   Thanks!!

## 2018-08-12 NOTE — Telephone Encounter (Signed)
Pt aware that per Randi it's normal for numbers to go up and then slowly decrease and to keep her appointment. Pt verbalized understanding.

## 2018-08-16 ENCOUNTER — Other Ambulatory Visit (HOSPITAL_COMMUNITY): Payer: Self-pay | Admitting: *Deleted

## 2018-08-16 DIAGNOSIS — C569 Malignant neoplasm of unspecified ovary: Secondary | ICD-10-CM

## 2018-08-16 MED ORDER — HYDROCODONE-ACETAMINOPHEN 10-325 MG PO TABS: 1.0000 | ORAL_TABLET | ORAL | 0 refills | Status: DC | PRN

## 2018-08-26 ENCOUNTER — Inpatient Hospital Stay (HOSPITAL_COMMUNITY): Payer: Medicare Other

## 2018-08-26 ENCOUNTER — Inpatient Hospital Stay (HOSPITAL_COMMUNITY): Payer: Medicare Other | Admitting: Hematology

## 2018-08-26 ENCOUNTER — Encounter (HOSPITAL_COMMUNITY): Payer: Self-pay | Admitting: Hematology

## 2018-08-26 ENCOUNTER — Other Ambulatory Visit: Payer: Self-pay

## 2018-08-26 ENCOUNTER — Other Ambulatory Visit (HOSPITAL_COMMUNITY): Payer: Medicare Other

## 2018-08-26 VITALS — BP 117/53 | HR 75 | Temp 97.5°F | Resp 18

## 2018-08-26 DIAGNOSIS — Z90722 Acquired absence of ovaries, bilateral: Secondary | ICD-10-CM

## 2018-08-26 DIAGNOSIS — R180 Malignant ascites: Secondary | ICD-10-CM | POA: Diagnosis not present

## 2018-08-26 DIAGNOSIS — E1122 Type 2 diabetes mellitus with diabetic chronic kidney disease: Secondary | ICD-10-CM

## 2018-08-26 DIAGNOSIS — Z9221 Personal history of antineoplastic chemotherapy: Secondary | ICD-10-CM | POA: Diagnosis not present

## 2018-08-26 DIAGNOSIS — T451X5A Adverse effect of antineoplastic and immunosuppressive drugs, initial encounter: Principal | ICD-10-CM

## 2018-08-26 DIAGNOSIS — C481 Malignant neoplasm of specified parts of peritoneum: Secondary | ICD-10-CM

## 2018-08-26 DIAGNOSIS — G893 Neoplasm related pain (acute) (chronic): Secondary | ICD-10-CM

## 2018-08-26 DIAGNOSIS — E1142 Type 2 diabetes mellitus with diabetic polyneuropathy: Secondary | ICD-10-CM

## 2018-08-26 DIAGNOSIS — C578 Malignant neoplasm of overlapping sites of female genital organs: Secondary | ICD-10-CM

## 2018-08-26 DIAGNOSIS — R600 Localized edema: Secondary | ICD-10-CM

## 2018-08-26 DIAGNOSIS — Z9641 Presence of insulin pump (external) (internal): Secondary | ICD-10-CM

## 2018-08-26 DIAGNOSIS — C569 Malignant neoplasm of unspecified ovary: Secondary | ICD-10-CM

## 2018-08-26 DIAGNOSIS — Z90710 Acquired absence of both cervix and uterus: Secondary | ICD-10-CM

## 2018-08-26 DIAGNOSIS — I2510 Atherosclerotic heart disease of native coronary artery without angina pectoris: Secondary | ICD-10-CM

## 2018-08-26 DIAGNOSIS — Z79899 Other long term (current) drug therapy: Secondary | ICD-10-CM

## 2018-08-26 DIAGNOSIS — I700 Atherosclerosis of aorta: Secondary | ICD-10-CM

## 2018-08-26 DIAGNOSIS — C786 Secondary malignant neoplasm of retroperitoneum and peritoneum: Secondary | ICD-10-CM | POA: Diagnosis not present

## 2018-08-26 DIAGNOSIS — N189 Chronic kidney disease, unspecified: Secondary | ICD-10-CM

## 2018-08-26 DIAGNOSIS — D6481 Anemia due to antineoplastic chemotherapy: Secondary | ICD-10-CM

## 2018-08-26 LAB — CBC WITH DIFFERENTIAL/PLATELET
ABS IMMATURE GRANULOCYTES: 0.04 10*3/uL (ref 0.00–0.07)
Basophils Absolute: 0 10*3/uL (ref 0.0–0.1)
Basophils Relative: 0 %
Eosinophils Absolute: 0 10*3/uL (ref 0.0–0.5)
Eosinophils Relative: 0 %
HCT: 26.2 % — ABNORMAL LOW (ref 36.0–46.0)
HEMOGLOBIN: 7.8 g/dL — AB (ref 12.0–15.0)
IMMATURE GRANULOCYTES: 1 %
LYMPHS PCT: 5 %
Lymphs Abs: 0.4 10*3/uL — ABNORMAL LOW (ref 0.7–4.0)
MCH: 31.3 pg (ref 26.0–34.0)
MCHC: 29.8 g/dL — ABNORMAL LOW (ref 30.0–36.0)
MCV: 105.2 fL — ABNORMAL HIGH (ref 80.0–100.0)
MONO ABS: 0.4 10*3/uL (ref 0.1–1.0)
MONOS PCT: 4 %
NEUTROS ABS: 7.7 10*3/uL (ref 1.7–7.7)
NEUTROS PCT: 90 %
Platelets: 226 10*3/uL (ref 150–400)
RBC: 2.49 MIL/uL — AB (ref 3.87–5.11)
RDW: 15.6 % — ABNORMAL HIGH (ref 11.5–15.5)
WBC: 8.6 10*3/uL (ref 4.0–10.5)
nRBC: 0 % (ref 0.0–0.2)

## 2018-08-26 LAB — COMPREHENSIVE METABOLIC PANEL
ALT: 14 U/L (ref 0–44)
AST: 22 U/L (ref 15–41)
Albumin: 3.3 g/dL — ABNORMAL LOW (ref 3.5–5.0)
Alkaline Phosphatase: 69 U/L (ref 38–126)
Anion gap: 13 (ref 5–15)
BILIRUBIN TOTAL: 0.5 mg/dL (ref 0.3–1.2)
BUN: 30 mg/dL — AB (ref 8–23)
CHLORIDE: 99 mmol/L (ref 98–111)
CO2: 25 mmol/L (ref 22–32)
Calcium: 9.1 mg/dL (ref 8.9–10.3)
Creatinine, Ser: 1.09 mg/dL — ABNORMAL HIGH (ref 0.44–1.00)
GFR calc Af Amer: >60 mL/min (ref >60)
GFR calc non Af Amer: 52 mL/min — ABNORMAL LOW (ref >60)
GLUCOSE: 136 mg/dL — AB (ref 70–99)
POTASSIUM: 4.6 mmol/L (ref 3.5–5.1)
SODIUM: 137 mmol/L (ref 135–145)
Total Protein: 6 g/dL — ABNORMAL LOW (ref 6.5–8.1)

## 2018-08-26 LAB — MAGNESIUM: Magnesium: 1.7 mg/dL (ref 1.7–2.4)

## 2018-08-26 LAB — PREPARE RBC (CROSSMATCH)

## 2018-08-26 MED ORDER — HEPARIN SOD (PORK) LOCK FLUSH 100 UNIT/ML IV SOLN
500.0000 [IU] | Freq: Every day | INTRAVENOUS | Status: AC | PRN
  Administered 2018-08-26: 500 [IU]
  Filled 2018-08-26: qty 5

## 2018-08-26 MED ORDER — PEGFILGRASTIM 6 MG/0.6ML ~~LOC~~ PSKT
6.0000 mg | PREFILLED_SYRINGE | Freq: Once | SUBCUTANEOUS | Status: AC
  Administered 2018-08-26: 6 mg via SUBCUTANEOUS
  Filled 2018-08-26: qty 0.6

## 2018-08-26 MED ORDER — SODIUM CHLORIDE 0.9% FLUSH
10.0000 mL | INTRAVENOUS | Status: AC | PRN
  Administered 2018-08-26: 10 mL

## 2018-08-26 MED ORDER — ACETAMINOPHEN 325 MG PO TABS
650.0000 mg | ORAL_TABLET | Freq: Once | ORAL | Status: AC
  Administered 2018-08-26: 650 mg via ORAL
  Filled 2018-08-26: qty 2

## 2018-08-26 MED ORDER — SODIUM CHLORIDE 0.9 % IV SOLN
75.0000 mg/m2 | Freq: Once | INTRAVENOUS | Status: AC
  Administered 2018-08-26: 160 mg via INTRAVENOUS
  Filled 2018-08-26: qty 16

## 2018-08-26 MED ORDER — DIPHENHYDRAMINE HCL 25 MG PO CAPS
25.0000 mg | ORAL_CAPSULE | Freq: Once | ORAL | Status: AC
  Administered 2018-08-26: 25 mg via ORAL
  Filled 2018-08-26: qty 1

## 2018-08-26 MED ORDER — SODIUM CHLORIDE 0.9 % IV SOLN
Freq: Once | INTRAVENOUS | Status: AC
  Administered 2018-08-26: 11:00:00 via INTRAVENOUS

## 2018-08-26 MED ORDER — SODIUM CHLORIDE 0.9 % IV SOLN
10.0000 mg | Freq: Once | INTRAVENOUS | Status: AC
  Administered 2018-08-26: 10 mg via INTRAVENOUS
  Filled 2018-08-26: qty 1

## 2018-08-26 MED ORDER — SODIUM CHLORIDE 0.9% IV SOLUTION
250.0000 mL | Freq: Once | INTRAVENOUS | Status: AC
  Administered 2018-08-26: 250 mL via INTRAVENOUS

## 2018-08-26 NOTE — Progress Notes (Signed)
Labs reviewed with Dr. Delton Coombes today, will proceed with treatment and will give one unit of blood today per MD.   .Ellen Hunt arrived today for [facility] neulasta on body injector. See MAR for administration details. Injector in place and engaged with green light indicator on flashing. Tolerated application with out problems.  Treatment given per orders. Patient tolerated it well without problems. Vitals stable and discharged home from clinic ambulatory. Follow up as scheduled.

## 2018-08-26 NOTE — Patient Instructions (Signed)
[facility] Discharge Instructions for Patients Receiving Chemotherapy   Beginning January 23rd 2017 lab work for the Cancer Center will be done in the  Main lab at [place] on 1st floor. If you have a lab appointment with the Cancer Center please come in thru the  Main Entrance and check in at the main information desk   Today you received the following chemotherapy agents   To help prevent nausea and vomiting after your treatment, we encourage you to take your nausea medication     If you develop nausea and vomiting, or diarrhea that is not controlled by your medication, call the clinic.  The clinic phone number is [phone]. Office hours are Monday-Friday 8:30am-5:00pm.  BELOW ARE SYMPTOMS THAT SHOULD BE REPORTED IMMEDIATELY:  *FEVER GREATER THAN 101.0 F  *CHILLS WITH OR WITHOUT FEVER  NAUSEA AND VOMITING THAT IS NOT CONTROLLED WITH YOUR NAUSEA MEDICATION  *UNUSUAL SHORTNESS OF BREATH  *UNUSUAL BRUISING OR BLEEDING  TENDERNESS IN MOUTH AND THROAT WITH OR WITHOUT PRESENCE OF ULCERS  *URINARY PROBLEMS  *BOWEL PROBLEMS  UNUSUAL RASH Items with * indicate a potential emergency and should be followed up as soon as possible. If you have an emergency after office hours please contact your primary care physician or go to the nearest emergency department.  Please call the clinic during office hours if you have any questions or concerns.   You may also contact the Patient Navigator at [phone] should you have any questions or need assistance in obtaining follow up care.      Resources For Cancer Patients and their Caregivers ? American Cancer Society: Can assist with transportation, wigs, general needs, runs Look Good Feel Better.        1-888-227-6333 ? Cancer Care: Provides financial assistance, online support groups, medication/co-pay assistance.  1-800-813-HOPE (4673) ? [facility] Assists [place] cancer  patients and their families through emotional , educational and financial support.  336-427-4357 ? [place] DSS Where to apply for food stamps, Medicaid and utility assistance. 336-342-1394 ? [facility]: Transportation to medical appointments. 336-347-2287 ? Social Security Administration: May apply for disability if have a Stage IV cancer. 336-342-7796 1-800-772-1213 ? [place] Aging, Disability and Transit Services: Assists with nutrition, care and transit needs. 336-349-2343         

## 2018-08-26 NOTE — Assessment & Plan Note (Signed)
1.  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 -Underwent 7 cycles of chemotherapy with carboplatin and paclitaxel from 06/01/2016 through 09/18/2016. -Germline mutation testing on 06/25/2016 was negative. - Underwent robotic assisted tumor debulking surgery on 10/14/2016 by Dr. Denman George. -3 cycles of chemotherapy with carboplatin and paclitaxel from 11/06/2016 through 12/25/2016. - Second line therapy with Doxil and bevacizumab from 05/25/2017 through 07/07/2017. - Third line therapy with gemcitabine from 07/30/2017 through December 2018, gemcitabine and cisplatin day 1 and day 8 q. 21 days from 11/02/2017, 7 cycles completed on 06/14/2018, with CT scan on 07/01/2018 showing progression. -She underwent paracentesis, with 4 L removed on 07/08/2018.  There is a subcutaneous area of skin thickening at the paracentesis site, questionable for seeding. - She received cycle 1 of docetaxel on 07/16/2018.  Cycle 2 was on 08/05/2018. - She experienced more bone pains mainly in the hip region.  She was told to take Zyrtec. -Her Ca1 25 level has increased from 1172 to 2367 after cycle 1.  This is likely from tumor lysis. - She may proceed with cycle 3 today.  She will receive 1 unit of blood transfusion.  I will plan to repeat CT scans prior to next visit.  2.  Malignant ascites: -Last paracentesis was on 07/08/2018, 4 L removed. -She will continue 40 mg Lasix twice daily.  We will consider another paracentesis if the fluid reaccumulate second.  3.  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  Will closely monitor it.  4.  Diabetes type 2: -She is on insulin pump and her sugars are fairly well controlled.  5.  Cancer related pain: -She has abdominal and low back pain.  She is taking hydrocodone 10 mg 4 to 5 tablets/day.  6.  Peripheral neuropathy: -She has on and off numbness in the toes.  No numbness in the hands  reported.  She was prescribed gabapentin few months ago but she is not taking it.

## 2018-08-26 NOTE — Progress Notes (Signed)
 Patient Care Team: Vincent, Carol L, MD as PCP - General (Pediatrics)  DIAGNOSIS:  Encounter Diagnosis  Name Primary?  . 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HCC)     SUMMARY OF ONCOLOGIC HISTORY: Oncology History   Negative genetic testing ER 80% PR 5% positive, Her 2 neu negative  Progressed on gemzar, cisplatin, doxil and Avastin     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HCC)   04/29/2016 Imaging    CT abd/pelvis- Extensive omental caking as well as moderate amount of ascites within the abdomen most compatible with peritoneal metastatic disease, of unknown primary. This may potentially be ovarian or a GI in etiology.    04/30/2016 Tumor Marker    CA 125- 7149.0 (H)    05/01/2016 Procedure    US paracentesis- Successful ultrasound-guided paracentesis yielding 1.8 liters of peritoneal fluid.    05/01/2016 Imaging    US pelvis- Both transabdominal and transvaginal sonography are significantly limited by large patient habitus and ascites. Neither uterus or ovaries were visualized on this exam.    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    05/08/2016 Imaging    CT chest- No evidence of metastatic disease in the chest. Peritoneal/omental disease with abdominal ascites in the upper abdomen, incompletely visualized.     05/13/2016 Procedure    Placement of single lumen port a cath via right internal jugular vein. The catheter tip lies at the cavoatrial junction. A power injectable port a cath was placed and is ready for immediate use.    05/15/2016 Procedure    US Paracentesis- 3400 ml yellow colored ascites removed    05/15/2016 - 09/18/2016 Chemotherapy    Carboplatin/Paclitaxel every 21 days x 7 cycles    07/01/2016 Miscellaneous    Genetic Counseling by Karen Powell-  Genetic testing was normal, and did not reveal a deleterious mutation in these  genes.     07/08/2016 Imaging    CT CAP- 1. Small volume ascites, significantly decreased. 2. Stable diffuse omental soft tissue caking and diffuse peritoneal thickening along the bilateral paracolic gutters and bilateral pelvic peritoneal reflections, consistent with peritoneal carcinomatosis. 3. Stable asymmetrically enlarged right ovary, which may represent the primary site of ovarian malignancy. 4. No evidence of metastatic disease in the chest. No new sites of metastatic disease in the abdomen or pelvis.    07/09/2016 Miscellaneous    Gyn Onc re-evaluation- modest response to therapy, 3 more cycles of chemotherapy recommended.      09/11/2016 Imaging    CT C/A/P No significant change omental soft tissue caking, consistent with metastatic disease. Mild ascites is decreased since previous study.  Increased calcification along peritoneal surface in pelvic cul-de-sac, consistent with treated peritoneal metastatic disease.  Stable 4.5cm homogeneous right pelvic mass, which favors a uterine fibroid although right ovarian neoplasm cannot definitely be excluded.  No new or progressive metastatic disease identified. No evidence of metastatic disease within the thorax.     10/14/2016 Procedure    Robotic-assisted laparoscopic total hysterectomy with bilateral salpingoophorectomy, ex lap omentectomy, radical tumor debulking by Dr. Rossi    10/17/2016 Pathology Results    Diagnosis 1. 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 - CERVIX AND ENDOMETRIUM FREE OF TUMOR. - SEE ONCOLOGY TABLE AND COMMENT. 2. Soft tissue, biopsy, umbilical nodule - HIGH GRADE SEROUS CARCINOMA. 3. Omentum, resection for tumor - HIGH GRADE   SEROUS CARCINOMA, 33 CM.    11/06/2016 - 12/25/2016 Chemotherapy    Carboplatin/Paclitaxel x 3 cycles     01/12/2017 Imaging    CT CAP- 1. Interval hysterectomy, bilateral  salpingo-oophorectomy and omentectomy without evidence of tumor recurrence. 2. No evidence of metastatic disease. 3. 5 mm nonobstructing lower pole left renal calculus.    01/13/2017 Remission    No evidence of residual disease on CT imaging.    05/04/2017 Imaging    CT CAP- New small amount of ascites within the abdomen and pelvis since 01/12/2017 which could indicate disease progression but no new identifiable tumor and no significant change in omental and mild peritoneal thickening.  No evidence of metastatic disease within the chest.  Coronary artery disease.  Aortic Atherosclerosis (ICD10-I70.0).    05/04/2017 Tumor Marker    Patient's tumor was tested for the following markers: CA 125 Results of the tumor marker test revealed 0034    05/18/2017 Imaging    ECHO; EF 60% -  65    05/25/2017 - 07/07/2017 Chemotherapy    She received Doxil and Avastin. Treatment is stopped due to disease progression    06/01/2017 Procedure    Successful ultrasound-guided therapeutic paracentesis yielding 2.8 liters of peritoneal fluid    06/22/2017 Tumor Marker    Patient's tumor was tested for the following markers: CA 125 Results of the tumor marker test revealed 13440    07/20/2017 Imaging    Mild increase in peritoneal carcinoma within abdomen pelvis since previous study. No significant change and minimal ascites.  No evidence of metastatic disease within the thorax. New mild airspace opacity in left lower lobe, consistent with inflammatory or infectious etiology.    07/30/2017 Tumor Marker    Patient's tumor was tested for the following markers: CA 125 Results of the tumor marker test revealed 12099    07/30/2017 - 06/14/2018 Chemotherapy    The patient had gemzar. Cisplatin is added on 11/02/18    08/03/2017 - 08/05/2017 Hospital Admission    She was admitted to the hospital for management of UTI and neutropenic fever    08/13/2017 Adverse Reaction    Dose of chemotherapy is reduced due to  neutropenic sepsis    08/24/2017 Tumor Marker    Patient's tumor was tested for the following markers: CA 125 Results of the tumor marker test revealed 7431    09/21/2017 Tumor Marker    Patient's tumor was tested for the following markers: CA 125 Results of the tumor marker test revealed 4176    10/09/2017 Imaging    No significant change in peritoneal carcinomatosis since previous study.  No new or progressive metastatic disease within the abdomen or pelvis.  Stable tiny nonobstructive left renal calculus. No evidence of ureteral calculi or hydronephrosis.    11/10/2017 Tumor Marker    Patient's tumor was tested for the following markers: CA 125 Results of the tumor marker test revealed 4257    11/24/2017 Tumor Marker    Patient's tumor was tested for the following markers: CA 125 Results of the tumor marker test revealed 3377    01/12/2018 Tumor Marker    Patient's tumor was tested for the following markers: CA 125 Results of the tumor marker test revealed 1595    01/21/2018 Imaging    1. Stable to slightly improved omental, gastrohepatic ligament and pelvic disease as detailed above. No new/progressive findings. 2. No acute abdominal or pelvic findings. 3. Stable lower pole left renal calculus.    02/08/2018 Tumor Marker  Patient's tumor was tested for the following markers: CA 125 Results of the tumor marker test revealed 1058    03/08/2018 Tumor Marker    Patient's tumor was tested for the following markers: CA-125 Results of the tumor marker test revealed 737.1    04/02/2018 Tumor Marker    Patient's tumor was tested for the following markers: CA-125 Results of the tumor marker test revealed 542.8    06/14/2018 Tumor Marker    CA 125- 1010    06/30/2018 Imaging    1. Mixed appearance but overall progression, with the left upper quadrant omental tumor slightly improved but with some potential new deposition of tumor along the hepatic flexure margin. There is also new  moderate ascites which must be considered suspicious for malignant ascites/peritoneal spread. 2. Other imaging findings of potential clinical significance: Nonobstructive left nephrolithiasis. Aortic Atherosclerosis (ICD10-I70.0). Mild subcutaneous edema along the sixth anterior abdominal wall potentially from mild panniculitis.    07/08/2018 Procedure    Successful ultrasound-guided paracentesis yielding 4 liters of peritoneal fluid.    07/15/2018 -  Chemotherapy    The patient had pegfilgrastim (NEULASTA ONPRO KIT) injection with Taxotere    07/16/2018 Tumor Marker    CA 125- 1172     CHIEF COMPLIANT: Cycle 3 of chemotherapy.  INTERVAL HISTORY: Ellen Hunt is seen for follow-up visit for next cycle of chemotherapy.  After her second cycle, she did not experience any nausea vomiting or diarrhea.  Peripheral neuropathy in the feet has been stable.  Leg swelling and abdominal swelling is also stable.  She is taking up to 45 tablets of hydrocodone for her abdominal and low back pain.  She did experience more pains in her hip region in the first few days after her last chemotherapy.  Appetite and weight has been more or less stable.  REVIEW OF SYSTEMS:   Constitutional: Denies fevers, chills or abnormal weight loss.  Positive for fatigue. Eyes: Denies blurriness of vision Ears, nose, mouth, throat, and face: Denies mucositis or sore throat Respiratory: Denies cough, dyspnea or wheezes Cardiovascular: Denies palpitation, chest discomfort Gastrointestinal:  Denies nausea, heartburn or change in bowel habits Skin: Denies abnormal skin rashes Lymphatics: Denies new lymphadenopathy or easy bruising Neurological: Occasional numbness in the feet.   Behavioral/Psych: Mood is stable, no new changes  Extremities: No lower extremity edema All other systems were reviewed with the patient and are negative.  I have reviewed the past medical history, past surgical history, social history and family  history with the patient and they are unchanged from previous note.  ALLERGIES:  is allergic to avandia [rosiglitazone]; micronase [glyburide]; and actos [pioglitazone].  MEDICATIONS:  Current Outpatient Medications  Medication Sig Dispense Refill  . atorvastatin (LIPITOR) 40 MG tablet Take 1 tablet (40 mg total) by mouth daily. 90 tablet 3  . benazepril (LOTENSIN) 10 MG tablet Take 0.5 tablets (5 mg total) by mouth daily. 45 tablet 2  . Biotin 1000 MCG tablet Take 1,000 mcg by mouth 3 (three) times daily.    Marland Kitchen dexamethasone (DECADRON) 4 MG tablet Take 2 tablets (8 mg total) by mouth 2 (two) times daily. Start the day before Taxotere. Then daily after chemo for 2 days, every 3 weeks 30 tablet 1  . furosemide (LASIX) 40 MG tablet Take 1 tablet (40 mg total) by mouth 2 (two) times daily. 60 tablet 9  . glucose blood (ONE TOUCH ULTRA TEST) test strip USE TO CHECK BLOOD SUGAR UP TO 5 TIMES A DAY 500 each  3  . HYDROcodone-acetaminophen (NORCO) 10-325 MG tablet Take 1 tablet by mouth every 4 (four) hours as needed. 90 tablet 0  . insulin lispro (HUMALOG) 100 UNIT/ML injection USE IN INSULIN PUMP AS DIRECTED. MAX DAILY DOSE OF 110 UNITS PER DAY 110 mL 0  . lidocaine-prilocaine (EMLA) cream Apply a quarter size amount to port site 1 hour prior to chemo. Do not rub in. Cover with plastic wrap. 30 g 3  . loperamide (IMODIUM A-D) 2 MG tablet Take 1 tablet (2 mg total) by mouth 4 (four) times daily as needed for diarrhea or loose stools. 30 tablet 0  . loratadine (CLARITIN) 10 MG tablet Take 10 mg by mouth daily.    . magnesium oxide (MAG-OX) 400 (241.3 Mg) MG tablet Take 1 tablet (400 mg total) by mouth 3 (three) times daily. 90 tablet 3  . metFORMIN (GLUCOPHAGE) 1000 MG tablet TAKE 1 TABLET BY MOUTH TWICE DAILY WITH A MEAL 180 tablet 3  . Multiple Vitamin (MULTIVITAMIN WITH MINERALS) TABS Take 1 tablet by mouth daily.    . multivitamin-lutein (OCUVITE-LUTEIN) CAPS capsule Take 1 capsule by mouth daily.      . omeprazole (PRILOSEC) 40 MG capsule Take 1 capsule (40 mg total) daily by mouth. 90 capsule 3  . ondansetron (ZOFRAN) 8 MG tablet Take 1 tablet (8 mg total) by mouth every 8 (eight) hours as needed for nausea or vomiting. 90 tablet 3  . polyethylene glycol (MIRALAX / GLYCOLAX) packet Take 17 g by mouth daily as needed for mild constipation.     . prochlorperazine (COMPAZINE) 10 MG tablet Take 1 tablet (10 mg total) by mouth every 6 (six) hours as needed for nausea or vomiting. 60 tablet 9  . senna (SENOKOT) 8.6 MG TABS tablet Take 1 tablet (8.6 mg total) by mouth at bedtime as needed for moderate constipation. 120 each 0  . vitamin B-12 (CYANOCOBALAMIN) 1000 MCG tablet Take 1,000 mcg by mouth daily.    . vitamin E (VITAMIN E) 400 UNIT capsule Take 400 Units by mouth daily.     No current facility-administered medications for this visit.    Facility-Administered Medications Ordered in Other Visits  Medication Dose Route Frequency Provider Last Rate Last Dose  . 0.9 %  sodium chloride infusion   Intravenous Once , , MD      . dexamethasone (DECADRON) 10 mg in sodium chloride 0.9 % 50 mL IVPB  10 mg Intravenous Once , , MD      . DOCEtaxel (TAXOTERE) 160 mg in sodium chloride 0.9 % 250 mL chemo infusion  75 mg/m2 (Treatment Plan Recorded) Intravenous Once , , MD      . pegfilgrastim (NEULASTA ONPRO KIT) injection 6 mg  6 mg Subcutaneous Once , , MD        PHYSICAL EXAMINATION: ECOG PERFORMANCE STATUS: 1 - Symptomatic but completely ambulatory  Vitals:   08/26/18 0948  BP: (!) 144/50  Pulse: 95  Resp: 18  Temp: 98 F (36.7 C)  SpO2: 97%   Filed Weights   08/26/18 0948  Weight: 233 lb 14.5 oz (106.1 kg)    GENERAL:alert, no distress and comfortable SKIN: skin color, texture, turgor are normal, no rashes or significant lesions EYES: normal, Conjunctiva are pink and non-injected, sclera clear OROPHARYNX:no  mucositis, no erythema and lips, buccal mucosa, and tongue normal  NECK: supple, thyroid normal size, non-tender, without nodularity LYMPH:  no palpable lymphadenopathy in the cervical, axillary or inguinal LUNGS: clear to auscultation and percussion   with normal breathing effort HEART: regular rate & rhythm and no murmurs and no lower extremity edema ABDOMEN: Soft, distended. MUSCULOSKELETAL:no cyanosis of digits and no clubbing  EXTREMITIES: 1+ edema bilaterally.  LABORATORY DATA:  I have reviewed the data as listed CMP Latest Ref Rng & Units 08/26/2018 08/05/2018 07/16/2018  Glucose 70 - 99 mg/dL 136(H) 120(H) 171(H)  BUN 8 - 23 mg/dL 30(H) 32(H) 33(H)  Creatinine 0.44 - 1.00 mg/dL 1.09(H) 1.31(H) 1.28(H)  Sodium 135 - 145 mmol/L 137 140 143  Potassium 3.5 - 5.1 mmol/L 4.6 4.7 4.9  Chloride 98 - 111 mmol/L 99 101 104  CO2 22 - 32 mmol/L 25 25 24  Calcium 8.9 - 10.3 mg/dL 9.1 9.4 10.0  Total Protein 6.5 - 8.1 g/dL 6.0(L) 6.1(L) 6.7  Total Bilirubin 0.3 - 1.2 mg/dL 0.5 0.4 0.3  Alkaline Phos 38 - 126 U/L 69 76 103  AST 15 - 41 U/L 22 23 13(L)  ALT 0 - 44 U/L 14 13 12   No results found for: CAN153   Lab Results  Component Value Date   WBC 8.6 08/26/2018   HGB 7.8 (L) 08/26/2018   HCT 26.2 (L) 08/26/2018   MCV 105.2 (H) 08/26/2018   PLT 226 08/26/2018   NEUTROABS 7.7 08/26/2018    ASSESSMENT & PLAN:  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HCC) 1.  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 -Underwent 7 cycles of chemotherapy with carboplatin and paclitaxel from 06/01/2016 through 09/18/2016. -Germline mutation testing on 06/25/2016 was negative. - Underwent robotic assisted tumor debulking surgery on 10/14/2016 by Dr. Rossi. -3 cycles of chemotherapy with carboplatin and paclitaxel from 11/06/2016 through 12/25/2016. - Second line therapy with Doxil and bevacizumab from  05/25/2017 through 07/07/2017. - Third line therapy with gemcitabine from 07/30/2017 through December 2018, gemcitabine and cisplatin day 1 and day 8 q. 21 days from 11/02/2017, 7 cycles completed on 06/14/2018, with CT scan on 07/01/2018 showing progression. -She underwent paracentesis, with 4 L removed on 07/08/2018.  There is a subcutaneous area of skin thickening at the paracentesis site, questionable for seeding. - She received cycle 1 of docetaxel on 07/16/2018.  Cycle 2 was on 08/05/2018. - She experienced more bone pains mainly in the hip region.  She was told to take Zyrtec. -Her Ca1 25 level has increased from 1172 to 2367 after cycle 1.  This is likely from tumor lysis. - She may proceed with cycle 3 today.  She will receive 1 unit of blood transfusion.  I will plan to repeat CT scans prior to next visit.  2.  Malignant ascites: -Last paracentesis was on 07/08/2018, 4 L removed. -She will continue 40 mg Lasix twice daily.  We will consider another paracentesis if the fluid reaccumulate second.  3.  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  Will closely monitor it.  4.  Diabetes type 2: -She is on insulin pump and her sugars are fairly well controlled.  5.  Cancer related pain: -She has abdominal and low back pain.  She is taking hydrocodone 10 mg 4 to 5 tablets/day.  6.  Peripheral neuropathy: -She has on and off numbness in the toes.  No numbness in the hands reported.  She was prescribed gabapentin few months ago but she is not taking it.     The patient has a good understanding of the overall plan. she agrees with it. she will call   with any problems that may develop before the next visit here.   , MD 08/26/18     

## 2018-08-27 ENCOUNTER — Ambulatory Visit (HOSPITAL_COMMUNITY): Payer: Medicare Other

## 2018-08-27 LAB — TYPE AND SCREEN
ABO/RH(D): O POS
ANTIBODY SCREEN: NEGATIVE
Unit division: 0

## 2018-08-27 LAB — BPAM RBC
BLOOD PRODUCT EXPIRATION DATE: 201911262359
ISSUE DATE / TIME: 201910241330
UNIT TYPE AND RH: 5100

## 2018-08-27 LAB — CA 125: CANCER ANTIGEN (CA) 125: 2333 U/mL — AB (ref 0.0–38.1)

## 2018-09-06 ENCOUNTER — Other Ambulatory Visit: Payer: Self-pay

## 2018-09-06 ENCOUNTER — Other Ambulatory Visit (HOSPITAL_COMMUNITY): Payer: Self-pay | Admitting: Nurse Practitioner

## 2018-09-06 ENCOUNTER — Encounter: Payer: Self-pay | Admitting: Internal Medicine

## 2018-09-06 DIAGNOSIS — C569 Malignant neoplasm of unspecified ovary: Secondary | ICD-10-CM

## 2018-09-06 MED ORDER — HYDROCODONE-ACETAMINOPHEN 10-325 MG PO TABS: 1.0000 | ORAL_TABLET | ORAL | 0 refills | Status: DC | PRN

## 2018-09-06 MED ORDER — GLUCOSE BLOOD VI STRP: ORAL_STRIP | 3 refills | Status: DC

## 2018-09-13 ENCOUNTER — Ambulatory Visit (HOSPITAL_COMMUNITY)
Admission: RE | Admit: 2018-09-13 | Discharge: 2018-09-13 | Disposition: A | Discharge status: Home / Self Care | Payer: Medicare Other | Source: Ambulatory Visit | Attending: Hematology | Admitting: Hematology

## 2018-09-13 DIAGNOSIS — C481 Malignant neoplasm of specified parts of peritoneum: Secondary | ICD-10-CM | POA: Insufficient documentation

## 2018-09-13 DIAGNOSIS — R188 Other ascites: Secondary | ICD-10-CM | POA: Insufficient documentation

## 2018-09-13 MED ORDER — IOPAMIDOL (ISOVUE-300) INJECTION 61%
100.0000 mL | Freq: Once | INTRAVENOUS | Status: AC | PRN
  Administered 2018-09-13: 100 mL via INTRAVENOUS

## 2018-09-14 ENCOUNTER — Other Ambulatory Visit: Payer: Self-pay | Admitting: Hematology and Oncology

## 2018-09-15 ENCOUNTER — Other Ambulatory Visit (HOSPITAL_COMMUNITY): Payer: Self-pay | Admitting: *Deleted

## 2018-09-15 MED ORDER — OMEPRAZOLE 40 MG PO CPDR: 40.0000 mg | DELAYED_RELEASE_CAPSULE | Freq: Every day | ORAL | 3 refills | Status: AC

## 2018-09-16 ENCOUNTER — Inpatient Hospital Stay (HOSPITAL_COMMUNITY): Payer: Medicare Other

## 2018-09-16 ENCOUNTER — Other Ambulatory Visit: Payer: Self-pay

## 2018-09-16 ENCOUNTER — Encounter (HOSPITAL_COMMUNITY): Payer: Self-pay | Admitting: Hematology

## 2018-09-16 ENCOUNTER — Inpatient Hospital Stay (HOSPITAL_COMMUNITY): Payer: Medicare Other | Attending: Hematology

## 2018-09-16 ENCOUNTER — Inpatient Hospital Stay (HOSPITAL_COMMUNITY): Payer: Medicare Other | Admitting: Hematology

## 2018-09-16 VITALS — BP 138/49 | HR 104 | Temp 97.6°F | Resp 18 | Wt 234.0 lb

## 2018-09-16 DIAGNOSIS — I2510 Atherosclerotic heart disease of native coronary artery without angina pectoris: Secondary | ICD-10-CM

## 2018-09-16 DIAGNOSIS — R600 Localized edema: Secondary | ICD-10-CM | POA: Diagnosis not present

## 2018-09-16 DIAGNOSIS — G629 Polyneuropathy, unspecified: Secondary | ICD-10-CM | POA: Insufficient documentation

## 2018-09-16 DIAGNOSIS — E1122 Type 2 diabetes mellitus with diabetic chronic kidney disease: Secondary | ICD-10-CM

## 2018-09-16 DIAGNOSIS — C481 Malignant neoplasm of specified parts of peritoneum: Secondary | ICD-10-CM | POA: Insufficient documentation

## 2018-09-16 DIAGNOSIS — Z79899 Other long term (current) drug therapy: Secondary | ICD-10-CM | POA: Insufficient documentation

## 2018-09-16 DIAGNOSIS — Z9221 Personal history of antineoplastic chemotherapy: Secondary | ICD-10-CM

## 2018-09-16 DIAGNOSIS — I700 Atherosclerosis of aorta: Secondary | ICD-10-CM | POA: Insufficient documentation

## 2018-09-16 DIAGNOSIS — G893 Neoplasm related pain (acute) (chronic): Secondary | ICD-10-CM | POA: Diagnosis not present

## 2018-09-16 DIAGNOSIS — C569 Malignant neoplasm of unspecified ovary: Secondary | ICD-10-CM | POA: Insufficient documentation

## 2018-09-16 DIAGNOSIS — Z794 Long term (current) use of insulin: Secondary | ICD-10-CM | POA: Diagnosis not present

## 2018-09-16 DIAGNOSIS — R5383 Other fatigue: Secondary | ICD-10-CM | POA: Diagnosis not present

## 2018-09-16 DIAGNOSIS — N189 Chronic kidney disease, unspecified: Secondary | ICD-10-CM | POA: Insufficient documentation

## 2018-09-16 DIAGNOSIS — Z9641 Presence of insulin pump (external) (internal): Secondary | ICD-10-CM | POA: Insufficient documentation

## 2018-09-16 DIAGNOSIS — T451X5A Adverse effect of antineoplastic and immunosuppressive drugs, initial encounter: Secondary | ICD-10-CM

## 2018-09-16 DIAGNOSIS — R180 Malignant ascites: Secondary | ICD-10-CM | POA: Diagnosis not present

## 2018-09-16 DIAGNOSIS — D6481 Anemia due to antineoplastic chemotherapy: Secondary | ICD-10-CM

## 2018-09-16 LAB — CBC WITH DIFFERENTIAL/PLATELET
Abs Immature Granulocytes: 0.19 10*3/uL — ABNORMAL HIGH (ref 0.00–0.07)
BASOS ABS: 0 10*3/uL (ref 0.0–0.1)
BASOS PCT: 0 %
Eosinophils Absolute: 0 10*3/uL (ref 0.0–0.5)
Eosinophils Relative: 0 %
HCT: 28.2 % — ABNORMAL LOW (ref 36.0–46.0)
Hemoglobin: 8.4 g/dL — ABNORMAL LOW (ref 12.0–15.0)
Immature Granulocytes: 1 %
Lymphocytes Relative: 4 %
Lymphs Abs: 0.6 10*3/uL — ABNORMAL LOW (ref 0.7–4.0)
MCH: 30.7 pg (ref 26.0–34.0)
MCHC: 29.8 g/dL — ABNORMAL LOW (ref 30.0–36.0)
MCV: 102.9 fL — AB (ref 80.0–100.0)
MONOS PCT: 6 %
Monocytes Absolute: 0.9 10*3/uL (ref 0.1–1.0)
Neutro Abs: 13.9 10*3/uL — ABNORMAL HIGH (ref 1.7–7.7)
Neutrophils Relative %: 89 %
PLATELETS: 222 10*3/uL (ref 150–400)
RBC: 2.74 MIL/uL — ABNORMAL LOW (ref 3.87–5.11)
RDW: 17 % — AB (ref 11.5–15.5)
WBC: 15.6 10*3/uL — ABNORMAL HIGH (ref 4.0–10.5)
nRBC: 0 % (ref 0.0–0.2)

## 2018-09-16 LAB — COMPREHENSIVE METABOLIC PANEL
ALK PHOS: 77 U/L (ref 38–126)
ALT: 14 U/L (ref 0–44)
AST: 25 U/L (ref 15–41)
Albumin: 3.4 g/dL — ABNORMAL LOW (ref 3.5–5.0)
Anion gap: 13 (ref 5–15)
BUN: 30 mg/dL — ABNORMAL HIGH (ref 8–23)
CALCIUM: 9.3 mg/dL (ref 8.9–10.3)
CO2: 25 mmol/L (ref 22–32)
Chloride: 100 mmol/L (ref 98–111)
Creatinine, Ser: 1.34 mg/dL — ABNORMAL HIGH (ref 0.44–1.00)
GFR calc Af Amer: 47 mL/min — ABNORMAL LOW (ref >60)
GFR calc non Af Amer: 41 mL/min — ABNORMAL LOW (ref >60)
GLUCOSE: 100 mg/dL — AB (ref 70–99)
Potassium: 4.1 mmol/L (ref 3.5–5.1)
SODIUM: 138 mmol/L (ref 135–145)
Total Bilirubin: 0.7 mg/dL (ref 0.3–1.2)
Total Protein: 6.7 g/dL (ref 6.5–8.1)

## 2018-09-16 LAB — SAMPLE TO BLOOD BANK

## 2018-09-16 MED ORDER — RUCAPARIB CAMSYLATE 300 MG PO TABS: 600.0000 mg | ORAL_TABLET | Freq: Two times a day (BID) | ORAL | 0 refills | Status: DC

## 2018-09-16 NOTE — Addendum Note (Signed)
Addended by: Derek Jack on: 09/16/2018 01:50 PM   Modules accepted: Orders

## 2018-09-16 NOTE — Progress Notes (Signed)
Patient Care Team: Ellen Maize, MD as PCP - General (Pediatrics)  DIAGNOSIS:  Encounter Diagnoses  Name Primary?  . Ovarian cancer, unspecified laterality ([name]) Yes  . 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 This may potentially be ovarian or a GI in etiology.    04/30/2016 Tumor Marker    CA 125- 7149.0 (H)    05/01/2016 Procedure    US paracentesis- Successful ultrasound-guided paracentesis yielding 1.8 liters of peritoneal fluid.    05/01/2016 Imaging    US pelvis- Both transabdominal and transvaginal sonography are significantly limited by large patient habitus and ascites. Neither uterus or ovaries were visualized on this exam.    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    05/08/2016 Imaging    CT chest- No evidence of metastatic disease in the chest. Peritoneal/omental disease with abdominal ascites in the upper abdomen, incompletely visualized.     05/13/2016 Procedure    Placement of single lumen port a cath via right internal jugular vein. The catheter tip lies at the cavoatrial junction. A power injectable port a cath was placed and is ready for immediate use.    05/15/2016 Procedure    US Paracentesis- 3400 ml yellow colored ascites removed    05/15/2016 - 09/18/2016 Chemotherapy    Carboplatin/Paclitaxel every 21 days x 7 cycles    07/01/2016 Miscellaneous    Genetic Counseling by Ellen Hunt-  Genetic testing was  normal, and did not reveal a deleterious mutation in these genes.     07/08/2016 Imaging    CT CAP- 1. Small volume ascites, significantly decreased. 2. Stable diffuse omental soft tissue caking and diffuse peritoneal thickening along the bilateral paracolic gutters and bilateral pelvic peritoneal reflections, consistent with peritoneal carcinomatosis. 3. Stable asymmetrically enlarged right ovary, which may represent the primary site of ovarian malignancy. 4. No evidence of metastatic disease in the chest. No new sites of metastatic disease in the abdomen or pelvis.    07/09/2016 Miscellaneous    Gyn Onc re-evaluation- modest response to therapy, 3 more cycles of chemotherapy recommended.      09/11/2016 Imaging    CT C/A/P No significant change omental soft tissue caking, consistent with metastatic disease. Mild ascites is decreased since previous study.  Increased calcification along peritoneal surface in pelvic cul-de-sac, consistent with treated peritoneal metastatic disease.  Stable 4.5cm homogeneous right pelvic mass, which favors a uterine fibroid although right ovarian neoplasm cannot definitely be excluded.  No new or progressive metastatic disease identified. No evidence of metastatic disease within the thorax.     10/14/2016 Procedure    Robotic-assisted laparoscopic total hysterectomy with bilateral salpingoophorectomy, ex lap omentectomy, radical tumor debulking by Dr. Denman Hunt    10/17/2016 Pathology Results    Diagnosis 1. 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 - CERVIX AND ENDOMETRIUM FREE OF TUMOR. - SEE ONCOLOGY TABLE AND COMMENT. 2. Soft tissue, biopsy, umbilical nodule - HIGH GRADE SEROUS CARCINOMA.  3. Omentum, resection for tumor - HIGH GRADE SEROUS CARCINOMA, 33 CM.    11/06/2016 - 12/25/2016 Chemotherapy    Carboplatin/Paclitaxel x 3 cycles      01/12/2017 Imaging    CT CAP- 1. Interval hysterectomy, bilateral salpingo-oophorectomy and omentectomy without evidence of tumor recurrence. 2. No evidence of metastatic disease. 3. 5 mm nonobstructing lower pole left renal calculus.    01/13/2017 Remission    No evidence of residual disease on CT imaging.    05/04/2017 Imaging    CT CAP- New small amount of ascites within the abdomen and pelvis since 01/12/2017 which could indicate disease progression but no new identifiable tumor and no significant change in omental and mild peritoneal thickening.  No evidence of metastatic disease within the chest.  Coronary artery disease.  Aortic Atherosclerosis (ICD10-I70.0).    05/04/2017 Tumor Marker    Patient's tumor was tested for the following markers: CA 125 Results of the tumor marker test revealed 8032    05/18/2017 Imaging    ECHO; EF 60% -  65    05/25/2017 - 07/07/2017 Chemotherapy    She received Doxil and Avastin. Treatment is stopped due to disease progression    06/01/2017 Procedure    Successful ultrasound-guided therapeutic paracentesis yielding 2.8 liters of peritoneal fluid    06/22/2017 Tumor Marker    Patient's tumor was tested for the following markers: CA 125 Results of the tumor marker test revealed 13440    07/20/2017 Imaging    Mild increase in peritoneal carcinoma within abdomen pelvis since previous study. No significant change and minimal ascites.  No evidence of metastatic disease within the thorax. New mild airspace opacity in left lower lobe, consistent with inflammatory or infectious etiology.    07/30/2017 Tumor Marker    Patient's tumor was tested for the following markers: CA 125 Results of the tumor marker test revealed 12099    07/30/2017 - 06/14/2018 Chemotherapy    The patient had gemzar. Cisplatin is added on 11/02/18    08/03/2017 - 08/05/2017 Hospital Admission    She was admitted to the hospital for management of UTI and neutropenic fever     08/13/2017 Adverse Reaction    Dose of chemotherapy is reduced due to neutropenic sepsis    08/24/2017 Tumor Marker    Patient's tumor was tested for the following markers: CA 125 Results of the tumor marker test revealed 7431    09/21/2017 Tumor Marker    Patient's tumor was tested for the following markers: CA 125 Results of the tumor marker test revealed 4176    10/09/2017 Imaging    No significant change in peritoneal carcinomatosis since previous study.  No new or progressive metastatic disease within the abdomen or pelvis.  Stable tiny nonobstructive left renal calculus. No evidence of ureteral calculi or hydronephrosis.    11/10/2017 Tumor Marker    Patient's tumor was tested for the following markers: CA 125 Results of the tumor marker test revealed 4257    11/24/2017 Tumor Marker    Patient's tumor was tested for the following markers: CA 125 Results of the tumor marker test revealed 3377    01/12/2018 Tumor Marker    Patient's tumor was tested for the following markers: CA 125 Results of the tumor marker test revealed 1595    01/21/2018 Imaging    1. Stable to slightly improved omental, gastrohepatic ligament and pelvic disease as detailed above. No new/progressive findings. 2. No acute abdominal or pelvic findings. 3. Stable lower pole left  renal calculus.    02/08/2018 Tumor Marker    Patient's tumor was tested for the following markers: CA 125 Results of the tumor marker test revealed 1058    03/08/2018 Tumor Marker    Patient's tumor was tested for the following markers: CA-125 Results of the tumor marker test revealed 737.1    04/02/2018 Tumor Marker    Patient's tumor was tested for the following markers: CA-125 Results of the tumor marker test revealed 542.8    06/14/2018 Tumor Marker    CA 125- 1010    06/30/2018 Imaging    1. Mixed appearance but overall progression, with the left upper quadrant omental tumor slightly improved but with some potential new  deposition of tumor along the hepatic flexure margin. There is also new moderate ascites which must be considered suspicious for malignant ascites/peritoneal spread. 2. Other imaging findings of potential clinical significance: Nonobstructive left nephrolithiasis. Aortic Atherosclerosis (ICD10-I70.0). Mild subcutaneous edema along the sixth anterior abdominal wall potentially from mild panniculitis.    07/08/2018 Procedure    Successful ultrasound-guided paracentesis yielding 4 liters of peritoneal fluid.    07/15/2018 -  Chemotherapy    The patient had pegfilgrastim (NEULASTA ONPRO KIT) injection with Taxotere    07/16/2018 Tumor Marker    CA 125- 1172     CHIEF COMPLIANT: Follow-up after CT scans.  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  She has received 3 cycles of docetaxel on 08/26/2018.  She has some numbness on the top of her feet.  She also has lower extremity swelling for which she takes Lasix 40 mg twice daily.  Denied any major problems with nausea or vomiting or diarrhea.  Denies any fevers or infections.  REVIEW OF SYSTEMS:   Constitutional: Denies fevers, chills or abnormal weight loss.  Positive for fatigue. Eyes: Denies blurriness of vision Ears, nose, mouth, throat, and face: Denies mucositis or sore throat Respiratory: Denies cough, dyspnea or wheezes Cardiovascular: Denies palpitation, chest discomfort Gastrointestinal:  Denies nausea, heartburn or change in bowel habits.  Abdomen is distended. Skin: Denies abnormal skin rashes Lymphatics: Denies new lymphadenopathy or easy bruising Neurological: Occasional numbness in the feet.   Behavioral/Psych: Mood is stable, no new changes  Extremities: 1+ edema bilaterally. All other systems were reviewed with the patient and are negative.  I have reviewed the past medical history, past surgical history, social history and family history with the patient and they are unchanged from  previous note.  ALLERGIES:  is allergic to avandia [rosiglitazone]; micronase [glyburide]; and actos [pioglitazone].  MEDICATIONS:  Current Outpatient Medications  Medication Sig Dispense Refill  . atorvastatin (LIPITOR) 40 MG tablet Take 1 tablet (40 mg total) by mouth daily. 90 tablet 3  . benazepril (LOTENSIN) 10 MG tablet Take 0.5 tablets (5 mg total) by mouth daily. 45 tablet 2  . Biotin 1000 MCG tablet Take 1,000 mcg by mouth 3 (three) times daily.    Marland Kitchen dexamethasone (DECADRON) 4 MG tablet Take 2 tablets (8 mg total) by mouth 2 (two) times daily. Start the day before Taxotere. Then daily after chemo for 2 days, every 3 weeks 30 tablet 1  . furosemide (LASIX) 40 MG tablet Take 1 tablet (40 mg total) by mouth 2 (two) times daily. 60 tablet 9  . glucose blood (ONE TOUCH ULTRA TEST) test strip USE TO CHECK BLOOD SUGAR UP TO 5 TIMES A DAY 500 each 3  . HYDROcodone-acetaminophen (NORCO) 10-325 MG tablet Take 1 tablet by mouth  every 4 (four) hours as needed. 90 tablet 0  . insulin lispro (HUMALOG) 100 UNIT/ML injection USE IN INSULIN PUMP AS DIRECTED. MAX DAILY DOSE OF 110 UNITS PER DAY 110 mL 0  . lidocaine-prilocaine (EMLA) cream Apply a quarter size amount to port site 1 hour prior to chemo. Do not rub in. Cover with plastic wrap. 30 g 3  . loperamide (IMODIUM A-D) 2 MG tablet Take 1 tablet (2 mg total) by mouth 4 (four) times daily as needed for diarrhea or loose stools. 30 tablet 0  . loratadine (CLARITIN) 10 MG tablet Take 10 mg by mouth daily.    . magnesium oxide (MAG-OX) 400 (241.3 Mg) MG tablet Take 1 tablet (400 mg total) by mouth 3 (three) times daily. 90 tablet 3  . metFORMIN (GLUCOPHAGE) 1000 MG tablet TAKE 1 TABLET BY MOUTH TWICE DAILY WITH A MEAL 180 tablet 3  . Multiple Vitamin (MULTIVITAMIN WITH MINERALS) TABS Take 1 tablet by mouth daily.    . multivitamin-lutein (OCUVITE-LUTEIN) CAPS capsule Take 1 capsule by mouth daily.    Marland Kitchen omeprazole (PRILOSEC) 40 MG capsule Take 1  capsule (40 mg total) by mouth daily. 90 capsule 3  . ondansetron (ZOFRAN) 8 MG tablet Take 1 tablet (8 mg total) by mouth every 8 (eight) hours as needed for nausea or vomiting. 90 tablet 3  . polyethylene glycol (MIRALAX / GLYCOLAX) packet Take 17 g by mouth daily as needed for mild constipation.     . prochlorperazine (COMPAZINE) 10 MG tablet Take 1 tablet (10 mg total) by mouth every 6 (six) hours as needed for nausea or vomiting. 60 tablet 9  . senna (SENOKOT) 8.6 MG TABS tablet Take 1 tablet (8.6 mg total) by mouth at bedtime as needed for moderate constipation. 120 each 0  . vitamin B-12 (CYANOCOBALAMIN) 1000 MCG tablet Take 1,000 mcg by mouth daily.    . vitamin E (VITAMIN E) 400 UNIT capsule Take 400 Units by mouth daily.     No current facility-administered medications for this visit.     PHYSICAL EXAMINATION: ECOG PERFORMANCE STATUS: 1 - Symptomatic but completely ambulatory  Vitals:   09/16/18 1010  BP: (!) 138/49  Pulse: (!) 104  Resp: 18  Temp: 97.6 F (36.4 C)  SpO2: 95%   Filed Weights   09/16/18 1010  Weight: 234 lb (106.1 kg)    GENERAL:alert, no distress and comfortable SKIN: skin color, texture, turgor are normal, no rashes or significant lesions EYES: normal, Conjunctiva are pink and non-injected, sclera clear OROPHARYNX:no mucositis, no erythema and lips, buccal mucosa, and tongue normal  NECK: supple, thyroid normal size, non-tender, without nodularity LYMPH:  no palpable lymphadenopathy in the cervical, axillary or inguinal LUNGS: clear to auscultation and percussion with normal breathing effort HEART: regular rate & rhythm and no murmurs and no lower extremity edema ABDOMEN: Soft, distended. MUSCULOSKELETAL:no cyanosis of digits and no clubbing  EXTREMITIES: 1+ edema bilaterally.  LABORATORY DATA:  I have reviewed the data as listed CMP Latest Ref Rng & Units 09/16/2018 08/26/2018 08/05/2018  Glucose 70 - 99 mg/dL 100(H) 136(H) 120(H)  BUN 8 - 23  mg/dL 30(H) 30(H) 32(H)  Creatinine 0.44 - 1.00 mg/dL 1.34(H) 1.09(H) 1.31(H)  Sodium 135 - 145 mmol/L 138 137 140  Potassium 3.5 - 5.1 mmol/L 4.1 4.6 4.7  Chloride 98 - 111 mmol/L 100 99 101  CO2 22 - 32 mmol/L '25 25 25  ' Calcium 8.9 - 10.3 mg/dL 9.3 9.1 9.4  Total Protein 6.5 -  8.1 g/dL 6.7 6.0(L) 6.1(L)  Total Bilirubin 0.3 - 1.2 mg/dL 0.7 0.5 0.4  Alkaline Phos 38 - 126 U/L 77 69 76  AST 15 - 41 U/L '25 22 23  ' ALT 0 - 44 U/L '14 14 13   ' No results found for: MIW803   Lab Results  Component Value Date   WBC 15.6 (H) 09/16/2018   HGB 8.4 (L) 09/16/2018   HCT 28.2 (L) 09/16/2018   MCV 102.9 (H) 09/16/2018   PLT 222 09/16/2018   NEUTROABS 13.9 (H) 09/16/2018   I have independently reviewed CT scan images and discussed with the patient.  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 -Underwent 7 cycles of chemotherapy with carboplatin and paclitaxel from 06/01/2016 through 09/18/2016. -Germline mutation testing on 06/25/2016 was negative. - Underwent robotic assisted tumor debulking surgery on 10/14/2016 by Dr. Denman Hunt. -3 cycles of chemotherapy with carboplatin and paclitaxel from 11/06/2016 through 12/25/2016. - Second line therapy with Doxil and bevacizumab from 05/25/2017 through 07/07/2017. - Third line therapy with gemcitabine from 07/30/2017 through December 2018, gemcitabine and cisplatin day 1 and day 8 q. 21 days from 11/02/2017, 7 cycles completed on 06/14/2018, with CT scan on 07/01/2018 showing progression. -She underwent paracentesis, with 4 L removed on 07/08/2018.  There is a subcutaneous area of skin thickening at the paracentesis site, questionable for seeding. - 3 cycles of docetaxel 75 mg/m from 07/16/2018 through 08/26/2018. - Ca1 25 levels on 07/01/2018 was 2005, 2367 on day of cycle 2, 2333 on day of cycle  3. -We discussed the results of the CT scan CAP which showed progression of her disease.  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  FDA approved therapeutic option rucaparib was suggested. - Other option is chemotherapy with topotecan.  I have recommended recall prep at this time.  We talked about the side effects in detail.  We will send the prescription to specialty pharmacy.  She will also take Compazine prior to taking her cooperative.  We will see her back in 2 weeks after start of her cooperative. -I will also arrange for ultrasound-guided paracentesis as she is feeling tightness in her abdomen.  2.  Malignant ascites: -Last paracentesis was on 07/08/2018, 4 L removed. -She will continue 40 mg Lasix twice daily.  3.  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  We will continue to monitor it.  4.  Diabetes type 2: -She is on insulin pump and her sugars are fairly well controlled.  5.  Cancer related pain: -She has abdominal and low back pain.  She is taking hydrocodone 10 mg 4 to 5 tablets/day.  6.  Peripheral neuropathy: -She has on and off numbness in the toes.  No numbness in the hands reported.  She was prescribed gabapentin few months ago but she is not taking it.     The patient has a good understanding of the overall plan. she agrees with it. she will call with any problems that may develop before the next visit here.  Derek Jack, MD 09/16/18

## 2018-09-16 NOTE — Progress Notes (Signed)
Dr. Delton Coombes at the bedside. Plan of care discussed. Pt presents today for Taxotere Chemo Infusion. Pt complains of swelling in the legs pre-tibial and feet. 3+ swelling noted in feet. No other complaints at this time.   Per Dr. Delton Coombes, " We are going to stop the chemo treatment IV and start her on Rubraca." Jene Every RN, nurse navigator informed by [facility] RN. Consent obtained for Rubraca and teaching performed by Jene Every RN at the bedside.

## 2018-09-16 NOTE — Progress Notes (Signed)
[email or web address] information given to patient. Explained to patient that we would be sending her prescription off to [place] and she will receive a phone call from Nuala Alpha, RPhT for formal education on medication. I explained to her that it could be a week to 1.5 weeks before she gets the medication. She verbalizes understanding of the wait time.  I gave her the number to the outpatient pharmacy, my contact number as the navigator and made sure she had the number to the clinic.  I advised her to call should she have any questions or concerns.  Consent was obtained for Rubraca.

## 2018-09-16 NOTE — Assessment & Plan Note (Signed)
1.  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 -Underwent 7 cycles of chemotherapy with carboplatin and paclitaxel from 06/01/2016 through 09/18/2016. -Germline mutation testing on 06/25/2016 was negative. - Underwent robotic assisted tumor debulking surgery on 10/14/2016 by Dr. Denman George. -3 cycles of chemotherapy with carboplatin and paclitaxel from 11/06/2016 through 12/25/2016. - Second line therapy with Doxil and bevacizumab from 05/25/2017 through 07/07/2017. - Third line therapy with gemcitabine from 07/30/2017 through December 2018, gemcitabine and cisplatin day 1 and day 8 q. 21 days from 11/02/2017, 7 cycles completed on 06/14/2018, with CT scan on 07/01/2018 showing progression. -She underwent paracentesis, with 4 L removed on 07/08/2018.  There is a subcutaneous area of skin thickening at the paracentesis site, questionable for seeding. - 3 cycles of docetaxel 75 mg/m from 07/16/2018 through 08/26/2018. - Ca1 25 levels on 07/01/2018 was 2005, 2367 on day of cycle 2, 2333 on day of cycle 3. -We discussed the results of the CT scan CAP which showed progression of her disease.  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  FDA approved therapeutic option rucaparib was suggested. - Other option is chemotherapy with topotecan.  I have recommended recall prep at this time.  We talked about the side effects in detail.  We will send the prescription to specialty pharmacy.  She will also take Compazine prior to taking her cooperative.  We will see her back in 2 weeks after start of her cooperative. -I will also arrange for ultrasound-guided paracentesis as she is feeling tightness in her abdomen.  2.  Malignant ascites: -Last paracentesis was on 07/08/2018, 4 L removed. -She will continue 40 mg Lasix twice  daily.  3.  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  We will continue to monitor it.  4.  Diabetes type 2: -She is on insulin pump and her sugars are fairly well controlled.  5.  Cancer related pain: -She has abdominal and low back pain.  She is taking hydrocodone 10 mg 4 to 5 tablets/day.  6.  Peripheral neuropathy: -She has on and off numbness in the toes.  No numbness in the hands reported.  She was prescribed gabapentin few months ago but she is not taking it.

## 2018-09-17 LAB — CA 125: Cancer Antigen (CA) 125: 2798 U/mL — ABNORMAL HIGH (ref 0.0–38.1)

## 2018-09-20 ENCOUNTER — Telehealth (HOSPITAL_COMMUNITY): Payer: Self-pay | Admitting: Pharmacist

## 2018-09-20 NOTE — Telephone Encounter (Signed)
Oral Oncology Pharmacist Encounter  Received new prescription for Rubraca (rucaparib) for the treatment of high-grade serous carcinoma of the peritoneum, planned duration until disease progression or unacceptable drug toxicity. 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   CBC/CMP from 09/16/18 assessed, no relevant lab abnormalities. Prescription dose and frequency assessed.   Current medication list in Epic reviewed, no DDIs with rucaparib identified.  Prescription has been e-scribed to the [facility] for benefits analysis and approval.  Oral Oncology Clinic will continue to follow for insurance authorization, copayment issues, initial counseling and start date.  Darl Pikes, PharmD, BCPS, [facility] Hematology/Oncology Clinical Pharmacist [facility]/AP [facility] [place] [facility] [phone]  09/20/2018 11:46 AM

## 2018-09-21 ENCOUNTER — Ambulatory Visit (HOSPITAL_COMMUNITY)
Admission: RE | Admit: 2018-09-21 | Discharge: 2018-09-21 | Disposition: A | Discharge status: Home / Self Care | Payer: Medicare Other | Source: Ambulatory Visit | Attending: Hematology | Admitting: Hematology

## 2018-09-21 ENCOUNTER — Encounter (HOSPITAL_COMMUNITY): Payer: Self-pay

## 2018-09-21 DIAGNOSIS — C786 Secondary malignant neoplasm of retroperitoneum and peritoneum: Secondary | ICD-10-CM | POA: Diagnosis not present

## 2018-09-21 DIAGNOSIS — C569 Malignant neoplasm of unspecified ovary: Secondary | ICD-10-CM | POA: Insufficient documentation

## 2018-09-21 DIAGNOSIS — C801 Malignant (primary) neoplasm, unspecified: Secondary | ICD-10-CM | POA: Diagnosis not present

## 2018-09-21 DIAGNOSIS — R188 Other ascites: Secondary | ICD-10-CM | POA: Diagnosis present

## 2018-09-21 NOTE — Progress Notes (Signed)
Paracentesis complete no signs of distress.  

## 2018-09-21 NOTE — Procedures (Signed)
PreOperative Dx: Ovarian cancer, peritoneal carcinomatosis, ascites Postoperative Dx: Ovarian cancer, peritoneal carcinomatosis, ascites Procedure:   US guided paracentesis Radiologist:  Thornton Papas Anesthesia:  10 ml of1% lidocaine Specimen:  7 L of yellow ascitic fluid EBL:   < 1 ml Complications: None

## 2018-09-22 ENCOUNTER — Telehealth (HOSPITAL_COMMUNITY): Payer: Self-pay | Admitting: Pharmacy Technician

## 2018-09-22 NOTE — Telephone Encounter (Signed)
Oral Oncology Patient Advocate Encounter  Was successful in securing patient a $3500 grant from [employer] to provide copayment coverage for Rubraca.  This will keep the out of pocket expense at $0.     [employer] ID: [id number]  I have spoken with the patient..     The billing information is as follows and has been shared with [place].    RxBin: [id number] PCN: [id number] Member ID: [id number] Group ID: [id number] Dates of Eligibility: 08/23/18 through 08/23/19  [place] Patient [place] Phone [phone] Fax [phone] 09/22/2018 2:43 PM

## 2018-09-22 NOTE — Telephone Encounter (Signed)
Oral Oncology Patient Advocate Encounter  Prior Authorization for Ellen Hunt has been approved.    PA# [id number] Effective dates: 09/22/18 through 11/03/19  Patients co-pay is $100.  We will apply for copay assistance.   Oral Oncology Clinic will continue to follow.   [place] Patient [name] Phone [phone] Fax [phone] 09/22/2018 2:25 PM

## 2018-09-22 NOTE — Telephone Encounter (Signed)
Oral Oncology Patient Advocate Encounter  Received notification from [employer] that prior authorization for Rubraca is required.  PA submitted on CoverMyMeds Key [id number] Status is pending  Oral Oncology Clinic will continue to follow.  [place] Patient [place] Phone [phone] Fax [phone] 09/22/2018 8:35 AM

## 2018-09-23 ENCOUNTER — Telehealth (HOSPITAL_COMMUNITY): Payer: Self-pay | Admitting: Pharmacist

## 2018-09-23 MED FILL — RUBRACA 300 MG TAB: 300 | 30 days supply | Qty: 120 | Fill #0

## 2018-09-23 NOTE — Telephone Encounter (Signed)
Called patient and scheduled delivery of Rubraca for 11/22.

## 2018-09-23 NOTE — Telephone Encounter (Signed)
Oral Chemotherapy Pharmacist Encounter  Medication will be delivered to Ellen Hunt tomorrow 09/24/18 for her to get started.  Patient Education I spoke with patient for overview of new oral chemotherapy medication: Rubraca (rucaparib) for the treatment of high-grade serous carcinoma of the peritoneum, planned duration until disease progression or unacceptable drug toxicity. 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    Counseled patient on administration, dosing, side effects, monitoring, drug-food interactions, safe handling, storage, and disposal. Patient will take 2 tablets (600 mg total) by mouth 2 (two) times daily.  Discussed with Ellen Hunt the moderate to high emetic risk of rucaparib and the use of her prochlorperazine and ondansetron to prevent/treat N/V.  Side effects include but not limited to: N/V, diarrhea, constipation, fatigue, decrease wbc/hgb/plt.    Reviewed with patient importance of keeping a medication schedule and plan for any missed doses.  Ellen Hunt voiced understanding and appreciation. All questions answered. Medication handout placed in the mail.  Provided patient with [facility] phone number. Patient knows to call the office with questions or concerns. [facility] will continue to follow.   N. , PharmD, BCPS, BCOP Hematology/Oncology Clinical Pharmacist [facility] Oral [facility] [phone]  09/23/2018 1:53 PM  

## 2018-09-26 ENCOUNTER — Other Ambulatory Visit: Payer: Self-pay | Admitting: Internal Medicine

## 2018-10-04 ENCOUNTER — Telehealth (HOSPITAL_COMMUNITY): Payer: Self-pay | Admitting: *Deleted

## 2018-10-04 ENCOUNTER — Telehealth (HOSPITAL_COMMUNITY): Payer: Self-pay | Admitting: Surgery

## 2018-10-04 ENCOUNTER — Other Ambulatory Visit (HOSPITAL_COMMUNITY): Payer: Self-pay | Admitting: *Deleted

## 2018-10-04 DIAGNOSIS — C569 Malignant neoplasm of unspecified ovary: Secondary | ICD-10-CM

## 2018-10-04 MED ORDER — HYDROCODONE-ACETAMINOPHEN 10-325 MG PO TABS: 1.0000 | ORAL_TABLET | ORAL | 0 refills | Status: DC | PRN

## 2018-10-04 NOTE — Telephone Encounter (Signed)
Pt had called stating that she was having pain in her legs after starting the Rucaparib, and that her hydrocodone is not taking the pain completely away.  Pt wanted to know if she could also take advil as needed for pain.  Per Dr. Delton Coombes, pt can take 1-2 tablets of advil a day as needed for pain but no more.  Pt verbalized understanding.

## 2018-10-05 ENCOUNTER — Telehealth (HOSPITAL_COMMUNITY): Payer: Self-pay | Admitting: Surgery

## 2018-10-05 ENCOUNTER — Other Ambulatory Visit (HOSPITAL_COMMUNITY): Payer: Self-pay | Admitting: Nurse Practitioner

## 2018-10-05 NOTE — Telephone Encounter (Signed)
Pt left voicemail saying she was feeling miserable, throwing up and weak.  Called pt back and per husband, pt is shaking, weak, vomiting, and her blood sugar is dropping.  Told pt's husband I would talk to Dr. Delton Coombes and call them back with his suggestions.      Per Dr. Delton Coombes, pt is to hold her Rucaparib until her appointment on Friday.  If she has no improvement in her symptoms before Friday, pt is to call our office back and at that point she may need to be brought in for fluids.  Called pt's husband back and provided this information-he verbalized understanding of instructions.

## 2018-10-07 ENCOUNTER — Ambulatory Visit (INDEPENDENT_AMBULATORY_CARE_PROVIDER_SITE_OTHER): Payer: Medicare Other | Admitting: Pediatrics

## 2018-10-07 ENCOUNTER — Encounter: Payer: Self-pay | Admitting: Pediatrics

## 2018-10-07 VITALS — BP 115/60 | HR 82 | Temp 97.5°F | Ht 63.0 in | Wt 225.4 lb

## 2018-10-07 DIAGNOSIS — I10 Essential (primary) hypertension: Secondary | ICD-10-CM | POA: Diagnosis not present

## 2018-10-07 DIAGNOSIS — E118 Type 2 diabetes mellitus with unspecified complications: Secondary | ICD-10-CM

## 2018-10-07 DIAGNOSIS — C481 Malignant neoplasm of specified parts of peritoneum: Secondary | ICD-10-CM | POA: Diagnosis not present

## 2018-10-07 DIAGNOSIS — M7989 Other specified soft tissue disorders: Secondary | ICD-10-CM

## 2018-10-07 DIAGNOSIS — Z9641 Presence of insulin pump (external) (internal): Secondary | ICD-10-CM

## 2018-10-07 DIAGNOSIS — E785 Hyperlipidemia, unspecified: Secondary | ICD-10-CM

## 2018-10-07 DIAGNOSIS — K5900 Constipation, unspecified: Secondary | ICD-10-CM

## 2018-10-07 MED ORDER — ATORVASTATIN CALCIUM 40 MG PO TABS: 40.0000 mg | ORAL_TABLET | Freq: Every day | ORAL | 3 refills | Status: AC

## 2018-10-07 MED ORDER — BENAZEPRIL HCL 10 MG PO TABS: 5.0000 mg | ORAL_TABLET | Freq: Every day | ORAL | 2 refills | Status: DC

## 2018-10-07 NOTE — Progress Notes (Signed)
Subjective:   Patient ID: Ellen Hunt, female    DOB: 06-08-1951, 67 y.o.   MRN: [id number] CC: Medical Management of Chronic Issues  HPI: Ellen Hunt is a 67 y.o. female   Here today with husband  Ovarian cancer, extraovarian primary peritoneal carcinoma: Follows with [facility].  Recently started on new oral chemotherapy 11/23.  Stopped it 2 days ago because of side effects, was very shaky, feeling awful, not able to walk normally.  Symptoms gotten better over the last 24 hours since being off of the medicine.  Has follow-up tomorrow with oncology.  Per patient, she had a 7 L paracentesis on 11/19.  Diabetes: Following with endocrinology, on insulin pump.  Has been using for years.  For the last 2 days because of symptoms as above, has not been able to use pump regularly.  Sugars have been 70s to 140s to 150s when she checks.  Rarely has lows into the 60s.  Constipation: Increases prns as needed at home, keeps MiraLAX as needed.  Hypertension: On benazepril, needs refill.  Dose recently decreased after AKI event.  Hyperlipidemia: On Lipitor, needs refill  She says overall her mood has been doing okay for last few weeks to months.  The last 2 days have been hard feeling as poorly as she did.  She is hopeful that she will start feeling better soon.  She is worried about what the next steps are in terms of treatment.  She is not currently seeing a counselor at the cancer center but is aware that they are available.  Her husband has been great support.  Depression screen [facility] 2/9 10/07/2018 04/07/2018 03/25/2017  Decreased Interest 2 0 0  Down, Depressed, Hopeless 2 0 0  PHQ - 2 Score 4 0 0  Altered sleeping 2 - -  Tired, decreased energy 3 - -  Change in appetite 1 - -  Feeling bad or failure about yourself  0 - -  Trouble concentrating 0 - -  Moving slowly or fidgety/restless 2 - -  Suicidal thoughts 0 - -  PHQ-9 Score 12 - -  Difficult doing work/chores Somewhat  difficult - -  Some recent data might be hidden     Relevant past medical, surgical, family and social history reviewed. Allergies and medications reviewed and updated. Social History   Tobacco Use  Smoking Status Never Smoker  Smokeless Tobacco Never Used   ROS: Per HPI   Objective:    BP 115/60   Pulse 82   Temp (!) 97.5 F (36.4 C) (Oral)   Ht 5\' 3"  (1.6 m)   Wt 225 lb 6.4 oz (102.2 kg)   BMI 39.93 kg/m   Wt Readings from Last 3 Encounters:  10/07/18 225 lb 6.4 oz (102.2 kg)  09/16/18 234 lb (106.1 kg)  08/26/18 233 lb 14.5 oz (106.1 kg)    Gen: NAD, alert, cooperative with exam, NCAT EYES: EOMI, no conjunctival injection, or no icterus CV: NRRR, normal S1/S2, no murmur, distal pulses 2+ b/l Resp: CTABL, no wheezes, normal WOB Abd: +BS, soft, mildly distended Ext: 1+ pitting edema, warm Neuro: Alert and oriented MSK: normal muscle bulk  Assessment & Plan:  Ellen Hunt was seen today for medical management of chronic issues.  Diagnoses and all orders for this visit:  Essential hypertension Stable, continue below.  If blood pressure decreases much more likely be able to stop. -     benazepril (LOTENSIN) 10 MG tablet; Take 0.5 tablets (5 mg total)  by mouth daily.  Hyperlipidemia, unspecified hyperlipidemia type Stable, continue below -     atorvastatin (LIPITOR) 40 MG tablet; Take 1 tablet (40 mg total) by mouth daily.  Constipation, unspecified constipation type Stable, taking MiraLAX as needed.  Leg swelling Stable, ongoing symptoms.  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  Has been well controlled.  Following blood sugars regularly.  Following with endocrinology.  Extraovarian primary peritoneal carcinoma [facility]) Has appointment tomorrow for follow-up with oncology.  Depressed mood Acutely worsened when feeling poorly last few days.  Positive PHQ 9  today.  Patient hopeful will improve over the next week.  Discussed resources including counselors at the cancer center, if not improving let me know.  Follow up plan: Return in about 6 months (around 04/08/2019). Assunta Found, MD [place]

## 2018-10-08 ENCOUNTER — Inpatient Hospital Stay (HOSPITAL_COMMUNITY): Payer: Medicare Other | Attending: Hematology | Admitting: Hematology

## 2018-10-08 ENCOUNTER — Encounter (HOSPITAL_COMMUNITY): Payer: Self-pay | Admitting: Family Medicine

## 2018-10-08 ENCOUNTER — Other Ambulatory Visit: Payer: Self-pay

## 2018-10-08 ENCOUNTER — Inpatient Hospital Stay (HOSPITAL_COMMUNITY)
Admission: AD | Admit: 2018-10-08 | Discharge: 2018-10-11 | DRG: 683 | Disposition: A | Discharge status: Home / Self Care | Payer: Medicare Other | Source: Ambulatory Visit | Attending: Internal Medicine | Admitting: Internal Medicine

## 2018-10-08 ENCOUNTER — Inpatient Hospital Stay (HOSPITAL_COMMUNITY): Payer: Medicare Other

## 2018-10-08 ENCOUNTER — Encounter (HOSPITAL_COMMUNITY): Payer: Self-pay | Admitting: Hematology

## 2018-10-08 ENCOUNTER — Other Ambulatory Visit (HOSPITAL_COMMUNITY): Payer: Self-pay | Admitting: Nurse Practitioner

## 2018-10-08 DIAGNOSIS — R739 Hyperglycemia, unspecified: Secondary | ICD-10-CM | POA: Diagnosis present

## 2018-10-08 DIAGNOSIS — R180 Malignant ascites: Secondary | ICD-10-CM | POA: Diagnosis present

## 2018-10-08 DIAGNOSIS — R875 Abnormal microbiological findings in specimens from female genital organs: Secondary | ICD-10-CM

## 2018-10-08 DIAGNOSIS — E1165 Type 2 diabetes mellitus with hyperglycemia: Secondary | ICD-10-CM | POA: Diagnosis present

## 2018-10-08 DIAGNOSIS — C55 Malignant neoplasm of uterus, part unspecified: Secondary | ICD-10-CM | POA: Diagnosis not present

## 2018-10-08 DIAGNOSIS — C786 Secondary malignant neoplasm of retroperitoneum and peritoneum: Secondary | ICD-10-CM | POA: Diagnosis not present

## 2018-10-08 DIAGNOSIS — D6481 Anemia due to antineoplastic chemotherapy: Secondary | ICD-10-CM | POA: Diagnosis present

## 2018-10-08 DIAGNOSIS — D638 Anemia in other chronic diseases classified elsewhere: Secondary | ICD-10-CM | POA: Diagnosis present

## 2018-10-08 DIAGNOSIS — E785 Hyperlipidemia, unspecified: Secondary | ICD-10-CM | POA: Diagnosis present

## 2018-10-08 DIAGNOSIS — I10 Essential (primary) hypertension: Secondary | ICD-10-CM | POA: Diagnosis not present

## 2018-10-08 DIAGNOSIS — I129 Hypertensive chronic kidney disease with stage 1 through stage 4 chronic kidney disease, or unspecified chronic kidney disease: Secondary | ICD-10-CM | POA: Diagnosis present

## 2018-10-08 DIAGNOSIS — C482 Malignant neoplasm of peritoneum, unspecified: Secondary | ICD-10-CM | POA: Diagnosis present

## 2018-10-08 DIAGNOSIS — E1149 Type 2 diabetes mellitus with other diabetic neurological complication: Secondary | ICD-10-CM | POA: Diagnosis present

## 2018-10-08 DIAGNOSIS — T451X5A Adverse effect of antineoplastic and immunosuppressive drugs, initial encounter: Secondary | ICD-10-CM | POA: Diagnosis present

## 2018-10-08 DIAGNOSIS — Z79899 Other long term (current) drug therapy: Secondary | ICD-10-CM | POA: Insufficient documentation

## 2018-10-08 DIAGNOSIS — R188 Other ascites: Secondary | ICD-10-CM | POA: Diagnosis not present

## 2018-10-08 DIAGNOSIS — E1142 Type 2 diabetes mellitus with diabetic polyneuropathy: Secondary | ICD-10-CM | POA: Diagnosis present

## 2018-10-08 DIAGNOSIS — E860 Dehydration: Secondary | ICD-10-CM | POA: Diagnosis present

## 2018-10-08 DIAGNOSIS — H409 Unspecified glaucoma: Secondary | ICD-10-CM | POA: Diagnosis present

## 2018-10-08 DIAGNOSIS — N179 Acute kidney failure, unspecified: Secondary | ICD-10-CM | POA: Diagnosis present

## 2018-10-08 DIAGNOSIS — E875 Hyperkalemia: Secondary | ICD-10-CM | POA: Diagnosis present

## 2018-10-08 DIAGNOSIS — R234 Changes in skin texture: Secondary | ICD-10-CM

## 2018-10-08 DIAGNOSIS — E1122 Type 2 diabetes mellitus with diabetic chronic kidney disease: Secondary | ICD-10-CM | POA: Insufficient documentation

## 2018-10-08 DIAGNOSIS — Z806 Family history of leukemia: Secondary | ICD-10-CM

## 2018-10-08 DIAGNOSIS — N390 Urinary tract infection, site not specified: Secondary | ICD-10-CM | POA: Diagnosis not present

## 2018-10-08 DIAGNOSIS — Z8543 Personal history of malignant neoplasm of ovary: Secondary | ICD-10-CM

## 2018-10-08 DIAGNOSIS — G893 Neoplasm related pain (acute) (chronic): Secondary | ICD-10-CM | POA: Insufficient documentation

## 2018-10-08 DIAGNOSIS — Z90710 Acquired absence of both cervix and uterus: Secondary | ICD-10-CM

## 2018-10-08 DIAGNOSIS — Z6841 Body Mass Index (BMI) 40.0 and over, adult: Secondary | ICD-10-CM

## 2018-10-08 DIAGNOSIS — Z8249 Family history of ischemic heart disease and other diseases of the circulatory system: Secondary | ICD-10-CM

## 2018-10-08 DIAGNOSIS — B9620 Unspecified Escherichia coli [E. coli] as the cause of diseases classified elsewhere: Secondary | ICD-10-CM | POA: Diagnosis not present

## 2018-10-08 DIAGNOSIS — G629 Polyneuropathy, unspecified: Secondary | ICD-10-CM | POA: Insufficient documentation

## 2018-10-08 DIAGNOSIS — C569 Malignant neoplasm of unspecified ovary: Secondary | ICD-10-CM

## 2018-10-08 DIAGNOSIS — Z9641 Presence of insulin pump (external) (internal): Secondary | ICD-10-CM | POA: Diagnosis not present

## 2018-10-08 DIAGNOSIS — K7460 Unspecified cirrhosis of liver: Secondary | ICD-10-CM | POA: Diagnosis present

## 2018-10-08 DIAGNOSIS — Z888 Allergy status to other drugs, medicaments and biological substances status: Secondary | ICD-10-CM

## 2018-10-08 DIAGNOSIS — G620 Drug-induced polyneuropathy: Secondary | ICD-10-CM | POA: Diagnosis present

## 2018-10-08 DIAGNOSIS — K219 Gastro-esophageal reflux disease without esophagitis: Secondary | ICD-10-CM | POA: Diagnosis present

## 2018-10-08 DIAGNOSIS — Z90722 Acquired absence of ovaries, bilateral: Secondary | ICD-10-CM

## 2018-10-08 DIAGNOSIS — Z981 Arthrodesis status: Secondary | ICD-10-CM

## 2018-10-08 DIAGNOSIS — Z794 Long term (current) use of insulin: Secondary | ICD-10-CM | POA: Insufficient documentation

## 2018-10-08 DIAGNOSIS — Z801 Family history of malignant neoplasm of trachea, bronchus and lung: Secondary | ICD-10-CM

## 2018-10-08 DIAGNOSIS — N189 Chronic kidney disease, unspecified: Secondary | ICD-10-CM

## 2018-10-08 DIAGNOSIS — E118 Type 2 diabetes mellitus with unspecified complications: Secondary | ICD-10-CM

## 2018-10-08 DIAGNOSIS — R7989 Other specified abnormal findings of blood chemistry: Secondary | ICD-10-CM | POA: Diagnosis present

## 2018-10-08 DIAGNOSIS — Z9221 Personal history of antineoplastic chemotherapy: Secondary | ICD-10-CM

## 2018-10-08 DIAGNOSIS — N183 Chronic kidney disease, stage 3 unspecified: Secondary | ICD-10-CM | POA: Diagnosis present

## 2018-10-08 DIAGNOSIS — C481 Malignant neoplasm of specified parts of peritoneum: Secondary | ICD-10-CM

## 2018-10-08 DIAGNOSIS — M545 Low back pain: Secondary | ICD-10-CM | POA: Insufficient documentation

## 2018-10-08 DIAGNOSIS — Z803 Family history of malignant neoplasm of breast: Secondary | ICD-10-CM

## 2018-10-08 DIAGNOSIS — Z833 Family history of diabetes mellitus: Secondary | ICD-10-CM

## 2018-10-08 LAB — COMPREHENSIVE METABOLIC PANEL
ALBUMIN: 3.1 g/dL — AB (ref 3.5–5.0)
ALT: 18 U/L (ref 0–44)
AST: 27 U/L (ref 15–41)
Alkaline Phosphatase: 82 U/L (ref 38–126)
Anion gap: 11 (ref 5–15)
BUN: 62 mg/dL — AB (ref 8–23)
CO2: 24 mmol/L (ref 22–32)
CREATININE: 4.87 mg/dL — AB (ref 0.44–1.00)
Calcium: 8.1 mg/dL — ABNORMAL LOW (ref 8.9–10.3)
Chloride: 98 mmol/L (ref 98–111)
GFR calc Af Amer: 10 mL/min — ABNORMAL LOW (ref >60)
GFR, EST NON AFRICAN AMERICAN: 9 mL/min — AB (ref >60)
GLUCOSE: 148 mg/dL — AB (ref 70–99)
POTASSIUM: 5.2 mmol/L — AB (ref 3.5–5.1)
Sodium: 133 mmol/L — ABNORMAL LOW (ref 135–145)
Total Bilirubin: 0.6 mg/dL (ref 0.3–1.2)
Total Protein: 5.8 g/dL — ABNORMAL LOW (ref 6.5–8.1)

## 2018-10-08 LAB — LACTATE DEHYDROGENASE: LDH: 153 U/L (ref 98–192)

## 2018-10-08 LAB — CBC WITH DIFFERENTIAL/PLATELET
Abs Immature Granulocytes: 0.02 10*3/uL (ref 0.00–0.07)
BASOS PCT: 1 %
Basophils Absolute: 0 10*3/uL (ref 0.0–0.1)
EOS ABS: 0.7 10*3/uL — AB (ref 0.0–0.5)
EOS PCT: 10 %
HCT: 27.3 % — ABNORMAL LOW (ref 36.0–46.0)
Hemoglobin: 8.1 g/dL — ABNORMAL LOW (ref 12.0–15.0)
Immature Granulocytes: 0 %
Lymphocytes Relative: 4 %
Lymphs Abs: 0.3 10*3/uL — ABNORMAL LOW (ref 0.7–4.0)
MCH: 30.7 pg (ref 26.0–34.0)
MCHC: 29.7 g/dL — ABNORMAL LOW (ref 30.0–36.0)
MCV: 103.4 fL — AB (ref 80.0–100.0)
MONOS PCT: 7 %
Monocytes Absolute: 0.5 10*3/uL (ref 0.1–1.0)
NEUTROS ABS: 5.6 10*3/uL (ref 1.7–7.7)
Neutrophils Relative %: 78 %
PLATELETS: 173 10*3/uL (ref 150–400)
RBC: 2.64 MIL/uL — ABNORMAL LOW (ref 3.87–5.11)
RDW: 17 % — ABNORMAL HIGH (ref 11.5–15.5)
WBC: 7.2 10*3/uL (ref 4.0–10.5)
nRBC: 0 % (ref 0.0–0.2)

## 2018-10-08 LAB — URINALYSIS, ROUTINE W REFLEX MICROSCOPIC
Bilirubin Urine: NEGATIVE
Glucose, UA: NEGATIVE mg/dL
HGB URINE DIPSTICK: NEGATIVE
Ketones, ur: NEGATIVE mg/dL
Nitrite: NEGATIVE
Protein, ur: NEGATIVE mg/dL
Specific Gravity, Urine: 1.016 (ref 1.005–1.030)
WBC, UA: >50 WBC/hpf — ABNORMAL HIGH (ref 0–5)
pH: 5 (ref 5.0–8.0)

## 2018-10-08 LAB — SAMPLE TO BLOOD BANK

## 2018-10-08 LAB — GLUCOSE, CAPILLARY
Glucose-Capillary: 110 mg/dL — ABNORMAL HIGH (ref 70–99)
Glucose-Capillary: 91 mg/dL (ref 70–99)

## 2018-10-08 LAB — MAGNESIUM: Magnesium: 3.7 mg/dL — ABNORMAL HIGH (ref 1.7–2.4)

## 2018-10-08 MED ORDER — ENOXAPARIN SODIUM 30 MG/0.3ML ~~LOC~~ SOLN
30.0000 mg | SUBCUTANEOUS | Status: DC
  Administered 2018-10-08 – 2018-10-11 (×4): 30 mg via SUBCUTANEOUS
  Filled 2018-10-08 (×4): qty 0.3

## 2018-10-08 MED ORDER — INSULIN PUMP
Freq: Three times a day (TID) | SUBCUTANEOUS | Status: DC
  Administered 2018-10-09: 14.5 via SUBCUTANEOUS
  Filled 2018-10-08: qty 1

## 2018-10-08 MED ORDER — NYSTATIN 100000 UNIT/GM EX POWD
Freq: Three times a day (TID) | CUTANEOUS | Status: DC
  Administered 2018-10-08 – 2018-10-11 (×10): via TOPICAL
  Filled 2018-10-08: qty 15

## 2018-10-08 MED ORDER — SODIUM CHLORIDE 0.9% FLUSH: 10.0000 mL | Freq: Once | INTRAVENOUS | Status: DC

## 2018-10-08 MED ORDER — SODIUM CHLORIDE 0.9 % IV SOLN
1.0000 g | INTRAVENOUS | Status: AC
  Administered 2018-10-09 – 2018-10-10 (×3): 1 g via INTRAVENOUS
  Filled 2018-10-08 (×2): qty 1
  Filled 2018-10-08 (×2): qty 10

## 2018-10-08 MED ORDER — SODIUM CHLORIDE 0.9 % IV SOLN
INTRAVENOUS | Status: DC
  Administered 2018-10-08: 11:00:00 via INTRAVENOUS

## 2018-10-08 MED ORDER — POLYETHYLENE GLYCOL 3350 17 G PO PACK: 17.0000 g | PACK | Freq: Every day | ORAL | Status: DC | PRN

## 2018-10-08 MED ORDER — ACETAMINOPHEN 325 MG PO TABS: 650.0000 mg | ORAL_TABLET | Freq: Four times a day (QID) | ORAL | Status: DC | PRN

## 2018-10-08 MED ORDER — OXYCODONE HCL 5 MG PO TABS
5.0000 mg | ORAL_TABLET | ORAL | Status: DC | PRN
  Administered 2018-10-08 (×2): 5 mg via ORAL
  Filled 2018-10-08 (×2): qty 1

## 2018-10-08 MED ORDER — SODIUM CHLORIDE 0.9 % IV SOLN
INTRAVENOUS | Status: DC
  Administered 2018-10-08 – 2018-10-09 (×2): via INTRAVENOUS

## 2018-10-08 MED ORDER — ONDANSETRON HCL 4 MG/2ML IJ SOLN
4.0000 mg | Freq: Four times a day (QID) | INTRAMUSCULAR | Status: DC | PRN
  Administered 2018-10-11: 4 mg via INTRAVENOUS
  Filled 2018-10-08: qty 2

## 2018-10-08 MED ORDER — SODIUM ZIRCONIUM CYCLOSILICATE 10 G PO PACK
10.0000 g | PACK | Freq: Once | ORAL | Status: AC
  Administered 2018-10-08: 10 g via ORAL
  Filled 2018-10-08: qty 1

## 2018-10-08 MED ORDER — ONDANSETRON HCL 4 MG PO TABS
4.0000 mg | ORAL_TABLET | Freq: Four times a day (QID) | ORAL | Status: DC | PRN
  Administered 2018-10-10: 4 mg via ORAL
  Filled 2018-10-08: qty 1

## 2018-10-08 MED ORDER — ACETAMINOPHEN 650 MG RE SUPP: 650.0000 mg | Freq: Four times a day (QID) | RECTAL | Status: DC | PRN

## 2018-10-08 NOTE — Progress Notes (Signed)
  [facility] [address] 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 This may potentially be ovarian or a GI in etiology.    04/30/2016 Tumor Marker    CA 125- 7149.0 (H)    05/01/2016 Procedure    US paracentesis- Successful ultrasound-guided paracentesis yielding 1.8 liters of peritoneal fluid.    05/01/2016 Imaging    US pelvis- Both transabdominal and transvaginal sonography are significantly limited by large patient habitus and ascites. Neither uterus or ovaries were visualized on this exam.    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    05/08/2016 Imaging    CT chest- No evidence of metastatic disease in the chest. Peritoneal/omental disease with abdominal ascites in the upper abdomen, incompletely visualized.     05/13/2016 Procedure    Placement of single lumen port a cath via right internal jugular vein. The catheter tip lies at the cavoatrial junction. A power injectable port a cath was placed and is ready for immediate use.    05/15/2016 Procedure    US Paracentesis- 3400 ml yellow colored ascites removed    05/15/2016 - 09/18/2016 Chemotherapy    Carboplatin/Paclitaxel every 21 days x 7 cycles    07/01/2016 Miscellaneous   Genetic Counseling by Karen Powell-  Genetic testing was normal, and did not reveal a deleterious mutation in these genes.     07/08/2016 Imaging    CT CAP- 1. Small volume ascites, significantly decreased. 2. Stable diffuse omental soft tissue caking and diffuse peritoneal thickening along the bilateral paracolic gutters and bilateral pelvic peritoneal reflections, consistent with peritoneal carcinomatosis. 3. Stable asymmetrically enlarged right ovary, which may represent the primary site of ovarian malignancy. 4. No evidence of metastatic disease in the chest. No new sites of metastatic disease in the abdomen or pelvis.    07/09/2016 Miscellaneous    Gyn Onc re-evaluation- modest response to therapy, 3 more cycles of chemotherapy recommended.      09/11/2016 Imaging    CT C/A/P No significant change omental soft tissue caking, consistent with metastatic disease. Mild ascites is decreased since previous study.  Increased calcification along peritoneal surface in pelvic cul-de-sac, consistent with treated peritoneal metastatic disease.  Stable 4.5cm homogeneous right pelvic mass, which favors a uterine fibroid although right ovarian neoplasm cannot definitely be excluded.  No new or progressive metastatic disease identified. No evidence of metastatic disease within the thorax.     10/14/2016 Procedure    Robotic-assisted laparoscopic total hysterectomy with bilateral salpingoophorectomy, ex lap omentectomy, radical tumor debulking by Dr. Rossi    10/17/2016 Pathology Results    Diagnosis 1. 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 - CERVIX AND ENDOMETRIUM FREE OF TUMOR. - SEE ONCOLOGY TABLE AND COMMENT. 2. Soft   tissue, biopsy, umbilical nodule - HIGH GRADE SEROUS CARCINOMA. 3. Omentum, resection for tumor - HIGH GRADE SEROUS CARCINOMA, 33 CM.    11/06/2016 - 12/25/2016  Chemotherapy    Carboplatin/Paclitaxel x 3 cycles     01/12/2017 Imaging    CT CAP- 1. Interval hysterectomy, bilateral salpingo-oophorectomy and omentectomy without evidence of tumor recurrence. 2. No evidence of metastatic disease. 3. 5 mm nonobstructing lower pole left renal calculus.    01/13/2017 Remission    No evidence of residual disease on CT imaging.    05/04/2017 Imaging    CT CAP- New small amount of ascites within the abdomen and pelvis since 01/12/2017 which could indicate disease progression but no new identifiable tumor and no significant change in omental and mild peritoneal thickening.  No evidence of metastatic disease within the chest.  Coronary artery disease.  Aortic Atherosclerosis (ICD10-I70.0).    05/04/2017 Tumor Marker    Patient's tumor was tested for the following markers: CA 125 Results of the tumor marker test revealed 7654    05/18/2017 Imaging    ECHO; EF 60% -  65    05/25/2017 - 07/07/2017 Chemotherapy    She received Doxil and Avastin. Treatment is stopped due to disease progression    06/01/2017 Procedure    Successful ultrasound-guided therapeutic paracentesis yielding 2.8 liters of peritoneal fluid    06/22/2017 Tumor Marker    Patient's tumor was tested for the following markers: CA 125 Results of the tumor marker test revealed 13440    07/20/2017 Imaging    Mild increase in peritoneal carcinoma within abdomen pelvis since previous study. No significant change and minimal ascites.  No evidence of metastatic disease within the thorax. New mild airspace opacity in left lower lobe, consistent with inflammatory or infectious etiology.    07/30/2017 Tumor Marker    Patient's tumor was tested for the following markers: CA 125 Results of the tumor marker test revealed 12099    07/30/2017 - 06/14/2018 Chemotherapy    The patient had gemzar. Cisplatin is added on 11/02/18    08/03/2017 - 08/05/2017 Hospital Admission    She was admitted to the  hospital for management of UTI and neutropenic fever    08/13/2017 Adverse Reaction    Dose of chemotherapy is reduced due to neutropenic sepsis    08/24/2017 Tumor Marker    Patient's tumor was tested for the following markers: CA 125 Results of the tumor marker test revealed 7431    09/21/2017 Tumor Marker    Patient's tumor was tested for the following markers: CA 125 Results of the tumor marker test revealed 4176    10/09/2017 Imaging    No significant change in peritoneal carcinomatosis since previous study.  No new or progressive metastatic disease within the abdomen or pelvis.  Stable tiny nonobstructive left renal calculus. No evidence of ureteral calculi or hydronephrosis.    11/10/2017 Tumor Marker    Patient's tumor was tested for the following markers: CA 125 Results of the tumor marker test revealed 4257    11/24/2017 Tumor Marker    Patient's tumor was tested for the following markers: CA 125 Results of the tumor marker test revealed 3377    01/12/2018 Tumor Marker    Patient's tumor was tested for the following markers: CA 125 Results of the tumor marker test revealed 1595    01/21/2018 Imaging    1. Stable to slightly improved omental, gastrohepatic ligament and pelvic disease as detailed above. No new/progressive findings. 2. No acute  abdominal or pelvic findings. 3. Stable lower pole left renal calculus.    02/08/2018 Tumor Marker    Patient's tumor was tested for the following markers: CA 125 Results of the tumor marker test revealed 1058    03/08/2018 Tumor Marker    Patient's tumor was tested for the following markers: CA-125 Results of the tumor marker test revealed 737.1    04/02/2018 Tumor Marker    Patient's tumor was tested for the following markers: CA-125 Results of the tumor marker test revealed 542.8    06/14/2018 Tumor Marker    CA 125- 1010    06/30/2018 Imaging    1. Mixed appearance but overall progression, with the left upper quadrant omental  tumor slightly improved but with some potential new deposition of tumor along the hepatic flexure margin. There is also new moderate ascites which must be considered suspicious for malignant ascites/peritoneal spread. 2. Other imaging findings of potential clinical significance: Nonobstructive left nephrolithiasis. Aortic Atherosclerosis (ICD10-I70.0). Mild subcutaneous edema along the sixth anterior abdominal wall potentially from mild panniculitis.    07/08/2018 Procedure    Successful ultrasound-guided paracentesis yielding 4 liters of peritoneal fluid.    07/15/2018 -  Chemotherapy    The patient had pegfilgrastim (NEULASTA ONPRO KIT) injection with Taxotere    07/16/2018 Tumor Marker    CA 125- 1172      CANCER STAGING: Cancer Staging 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[place]) Staging form: [place], AJCC 7th Edition - Pathologic stage from 10/17/2016: FIGO Stage IIIC, calculated as Stage III (T3, N0, cM0) - Signed by Baird Cancer, PA-C on 10/30/2016    INTERVAL HISTORY:  Ellen Hunt 67 y.o. female returns for routine follow-up peritoneal carcinoma. She is here today with her husband. She is not feeling well today. She is very shakey, unable to eat due to nausea. She takes her nausea medication every 6 hours. She is unable to walk without assistance. She is unable to get comfortable and sleep. Her vision is more blurry than before she started the medication. Denies any abdominal pain. Denies any fevers or recent infections. Denies any vomiting or diarrhea. Denies any bleeding. She reports her appetite and energy level at zero.     REVIEW OF SYSTEMS:  Review of Systems  Gastrointestinal: Positive for constipation, diarrhea and nausea.  Musculoskeletal: Positive for arthralgias.  Psychiatric/Behavioral: Positive for sleep disturbance.  All other systems reviewed and are negative.    PAST MEDICAL/SURGICAL HISTORY:  Past Medical History:  Diagnosis Date  . Diabetes mellitus  without complication (HCC)    on insulin pump  . Dysrhythmia   . 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[place]) 05/09/2016  . Family history of breast cancer   . GERD (gastroesophageal reflux disease)   . History of blood transfusion   . History of bronchitis   . History of chemotherapy   . History of urinary tract infection   . Hyperlipidemia   . Hypertension   . Low serum vitamin D   . Ovarian cancer ([place]) 05/09/2016  . Shingles    Past Surgical History:  Procedure Laterality Date  . ABDOMINAL HYSTERECTOMY  10/14/2016  . CESAREAN SECTION    . DEBULKING N/A 10/14/2016   Procedure: DEBULKING;  Surgeon: Everitt Amber, MD;  Location: [facility];  Service: Gynecology;  Laterality: N/A;  . IR PARACENTESIS  06/01/2017  . LAPAROTOMY WITH STAGING N/A 10/14/2016   Procedure: LAPAROTOMY WITH OMENTECTOMY AND TUMOR DEBULGING;  Surgeon: Everitt Amber, MD;  Location: [facility];  Service: Gynecology;  Laterality:  N/A;  . LUMBAR FUSION  08/21/15   L3-L4 Dr. Timmothy Euler  . OMENTECTOMY N/A 10/14/2016   Procedure: OMENTECTOMY;  Surgeon: Everitt Amber, MD;  Location: [facility];  Service: Gynecology;  Laterality: N/A;  . ROBOTIC ASSISTED TOTAL HYSTERECTOMY WITH BILATERAL SALPINGO OOPHERECTOMY Bilateral 10/14/2016   Procedure: XI ROBOTIC ASSISTED TOTAL LAPARSCOPIC  HYSTERECTOMY WITH BILATERAL SALPINGO OOPHORECTOMY;  Surgeon: Everitt Amber, MD;  Location: [facility];  Service: Gynecology;  Laterality: Bilateral;     SOCIAL HISTORY:  Social History   Socioeconomic History  . Marital status: Married    Spouse name: Gershon Mussel  . Number of children: 2  . Years of education: Not on file  . Highest education level: Not on file  Occupational History  . Occupation: retired  Scientific laboratory technician  . Financial resource strain: Not on file  . Food insecurity:    Worry: Not on file    Inability: Not on file  . Transportation needs:    Medical: Not on file    Non-medical: Not on file  Tobacco Use  . Smoking status: Never Smoker  .  Smokeless tobacco: Never Used  Substance and Sexual Activity  . Alcohol use: No  . Drug use: No  . Sexual activity: Not on file    Comment: married  Lifestyle  . Physical activity:    Days per week: Not on file    Minutes per session: Not on file  . Stress: Not on file  Relationships  . Social connections:    Talks on phone: Not on file    Gets together: Not on file    Attends religious service: Not on file    Active member of club or organization: Not on file    Attends meetings of clubs or organizations: Not on file    Relationship status: Not on file  . Intimate partner violence:    Fear of current or ex partner: Not on file    Emotionally abused: Not on file    Physically abused: Not on file    Forced sexual activity: Not on file  Other Topics Concern  . Not on file  Social History Narrative  . Not on file    FAMILY HISTORY:  Family History  Problem Relation Age of Onset  . Lung cancer Mother        smoker; dx in her 41s  . Leukemia Father   . Diabetes Paternal Grandmother   . Heart attack Paternal Grandmother   . Diabetes Paternal Grandfather   . Breast cancer Paternal Aunt        dx in her 64s-30s  . Leukemia Paternal Uncle   . Heart attack Maternal Grandfather   . Breast cancer Cousin        maternal first cousin    CURRENT MEDICATIONS:  Outpatient Encounter Medications as of 10/08/2018  Medication Sig Note  . atorvastatin (LIPITOR) 40 MG tablet Take 1 tablet (40 mg total) by mouth daily.   . benazepril (LOTENSIN) 10 MG tablet Take 0.5 tablets (5 mg total) by mouth daily.   . Biotin 1000 MCG tablet Take 1,000 mcg by mouth 3 (three) times daily.   . furosemide (LASIX) 40 MG tablet Take 1 tablet (40 mg total) by mouth 2 (two) times daily.   Marland Kitchen glucose blood (ONE TOUCH ULTRA TEST) test strip USE TO CHECK BLOOD SUGAR UP TO 5 TIMES A DAY   . HYDROcodone-acetaminophen (NORCO) 10-325 MG tablet Take 1 tablet by mouth every 4 (four) hours as  needed.   . insulin  lispro (HUMALOG) 100 UNIT/ML injection USE IN INSULIN PUMP AS DIRECTED. MAX DAILY DOSE OF 110 UNITS PER DAY.   Marland Kitchen lidocaine-prilocaine (EMLA) cream Apply a quarter size amount to port site 1 hour prior to chemo. Do not rub in. Cover with plastic wrap.   . loperamide (IMODIUM A-D) 2 MG tablet Take 1 tablet (2 mg total) by mouth 4 (four) times daily as needed for diarrhea or loose stools. 02-02-202019: As needed  . loratadine (CLARITIN) 10 MG tablet Take 10 mg by mouth daily.   . magnesium oxide (MAG-OX) 400 (241.3 Mg) MG tablet Take 1 tablet (400 mg total) by mouth 3 (three) times daily.   . metFORMIN (GLUCOPHAGE) 1000 MG tablet TAKE 1 TABLET BY MOUTH TWICE DAILY WITH A MEAL   . Multiple Vitamin (MULTIVITAMIN WITH MINERALS) TABS Take 1 tablet by mouth daily.   . multivitamin-lutein (OCUVITE-LUTEIN) CAPS capsule Take 1 capsule by mouth daily.   Marland Kitchen omeprazole (PRILOSEC) 40 MG capsule Take 1 capsule (40 mg total) by mouth daily.   . ondansetron (ZOFRAN) 8 MG tablet Take 1 tablet (8 mg total) by mouth every 8 (eight) hours as needed for nausea or vomiting.   . polyethylene glycol (MIRALAX / GLYCOLAX) packet Take 17 g by mouth daily as needed for mild constipation.    . prochlorperazine (COMPAZINE) 10 MG tablet Take 1 tablet (10 mg total) by mouth every 6 (six) hours as needed for nausea or vomiting.   . rucaparib camsylate (RUBRACA) 300 MG tablet Take 2 tablets (600 mg total) by mouth 2 (two) times daily.   Marland Kitchen senna (SENOKOT) 8.6 MG TABS tablet Take 1 tablet (8.6 mg total) by mouth at bedtime as needed for moderate constipation.   . vitamin B-12 (CYANOCOBALAMIN) 1000 MCG tablet Take 1,000 mcg by mouth daily.   . vitamin E (VITAMIN E) 400 UNIT capsule Take 400 Units by mouth daily.    No facility-administered encounter medications on file as of 10/08/2018.     ALLERGIES:  Allergies  Allergen Reactions  . Avandia [Rosiglitazone] Other (See Comments)    Legs swelled  . Micronase [Glyburide] Swelling  .  Actos [Pioglitazone] Other (See Comments)    Edema / leg swelling     PHYSICAL EXAM:  ECOG Performance status: 1  Vitals:   10/08/18 0947  BP: (!) 144/39  Pulse: 89  Resp: 20  Temp: 98.6 F (37 C)  SpO2: 96%   Filed Weights   10/08/18 0947  Weight: 224 lb 8 oz (101.8 kg)    Physical Exam  Constitutional: She is oriented to person, place, and time. She appears well-developed and well-nourished.  Cardiovascular: Normal rate, regular rhythm and normal heart sounds.  Pulmonary/Chest: Effort normal and breath sounds normal.  Musculoskeletal:  Wheelchair   Neurological: She is alert and oriented to person, place, and time.  Skin: Skin is warm and dry.  Psychiatric: She has a normal mood and affect. Her behavior is normal. Judgment and thought content normal.     LABORATORY DATA:  I have reviewed the labs as listed.  CBC    Component Value Date/Time   WBC 7.2 10/08/2018 0910   RBC 2.64 (L) 10/08/2018 0910   HGB 8.1 (L) 10/08/2018 0910   HGB 9.6 (L) 11/02/2017 0756   HCT 27.3 (L) 10/08/2018 0910   HCT 30.6 (L) 11/02/2017 0756   PLT 173 10/08/2018 0910   PLT 150 11/02/2017 0756   PLT 40 (LL) 11/18/2016 1642  MCV 103.4 (H) 10/08/2018 0910   MCV 108.5 (H) 11/02/2017 0756   MCH 30.7 10/08/2018 0910   MCHC 29.7 (L) 10/08/2018 0910   RDW 17.0 (H) 10/08/2018 0910   RDW 17.8 (H) 11/02/2017 0756   LYMPHSABS 0.3 (L) 10/08/2018 0910   LYMPHSABS 0.4 (L) 11/02/2017 0756   MONOABS 0.5 10/08/2018 0910   MONOABS 0.5 11/02/2017 0756   EOSABS 0.7 (H) 10/08/2018 0910   EOSABS 0.7 (H) 11/02/2017 0756   EOSABS 0.1 11/18/2016 1642   BASOSABS 0.0 10/08/2018 0910   BASOSABS 0.0 11/02/2017 0756   CMP Latest Ref Rng & Units 10/08/2018 09/16/2018 08/26/2018  Glucose 70 - 99 mg/dL 148(H) 100(H) 136(H)  BUN 8 - 23 mg/dL 62(H) 30(H) 30(H)  Creatinine 0.44 - 1.00 mg/dL 4.87(H) 1.34(H) 1.09(H)  Sodium 135 - 145 mmol/L 133(L) 138 137  Potassium 3.5 - 5.1 mmol/L 5.2(H) 4.1 4.6  Chloride  98 - 111 mmol/L 98 100 99  CO2 22 - 32 mmol/L _0 Calcium 8.9 - 10.3 mg/dL 8.1(L) 9.3 9.1  Total Protein 6.5 - 8.1 g/dL 5.8(L) 6.7 6.0(L)  Total Bilirubin 0.3 - 1.2 mg/dL 0.6 0.7 0.5  Alkaline Phos 38 - 126 U/L 82 77 69  AST 15 - 41 U/L _1 ALT 0 - 44 U/L _2 I have reviewed Francene Finders, NP's note and agree with the documentation.  I personally performed a face-to-face visit, made revisions and my assessment and plan is as follows.      ASSESSMENT & PLAN:   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[place]) 1.  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 -Underwent 7 cycles of chemotherapy with carboplatin and paclitaxel from 06/01/2016 through 09/18/2016. -Germline mutation testing on 06/25/2016 was negative. - Underwent robotic assisted tumor debulking surgery on 10/14/2016 by Dr. Denman George. -3 cycles of chemotherapy with carboplatin and paclitaxel from 11/06/2016 through 12/25/2016. - Second line therapy with Doxil and bevacizumab from 05/25/2017 through 07/07/2017. - Third line therapy with gemcitabine from 07/30/2017 through December 2018, gemcitabine and cisplatin day 1 and day 8 q. 21 days from 11/02/2017, 7 cycles completed on 06/14/2018, with CT scan on 07/01/2018 showing progression. -She underwent paracentesis, with 4 L removed on 07/08/2018.  There is a subcutaneous area of skin thickening at the paracentesis site, questionable for seeding. - 3 cycles of docetaxel 75 mg/m from 07/16/2018 through 08/26/2018. - Ca1 25 levels on 07/01/2018 was 2005, 2367 on day of cycle 2, 2333 on day of cycle 3. -We discussed the results of the CT scan CAP which showed progression of her disease.  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  FDA approved therapeutic option rucaparib  was suggested. - Rucaparib 600 mg twice daily started on 09/25/2018. -She had paracentesis on 09/21/2018, 7 L fluid removed. - After she started rucaparib, 7 days later she started feeling very weak.  She was also nauseous.  She was taking Compazine every 6 hours.  She denied any vomiting.  She did have some diarrhea. -When she called [place] with severe weakness, we have told her to hold rucaparib on 10/05/2018. -Today she is still feeling very weak.  I reviewed blood work.  She developed acute renal insufficiency with creatinine of 4.87, BUN of 62.  Potassium was elevated at 5.2. - Rucaparib by itself  can cause increased creatinine.  However she is also on Lasix and losartan which can worsen creatinine.  I will hold them temporarily. -I have suggested her to be admitted to the hospital for IV hydration.  Nephrology consultation will also be requested. -I have talked to Dr. Wynetta Emery who kindly agreed to admit this patient to the floor.  2.  Malignant ascites: -Paracentesis on 07/08/2018, 4 L removed. -Paracentesis on 09/21/2018, 7 L removed. -We will hold her Lasix until creatinine improves.  3.  CKD: -She has mild CKD with a creatinine of 1.3 baseline.  However she developed acute renal insufficiency.  She is putting out enough urine. -We will hydrate her and admit her to the hospital.  4.  Diabetes type 2: -She is on insulin pump and her sugars are fairly well controlled.  5.  Cancer related pain: -She has abdominal and low back pain.  She is taking hydrocodone 10 mg 4 to 5 tablets/day.  6.  Peripheral neuropathy: -She has on and off numbness in the toes.  No numbness in the hands reported.  She was prescribed gabapentin few months ago but she is not taking it.      Orders placed this encounter:  No orders of the defined types were placed in this encounter.     Derek Jack, MD [place] [phone]

## 2018-10-08 NOTE — H&P (Addendum)
History and Physical  Ellen Hunt WUJ:[id number] DOB: 1951/02/14 DOA: 10/08/2018  Referring physician: Delton Coombes, MD PCP: Eustaquio Maize, MD   Chief Complaint: not feeling well / abnormal labs  HPI: Ellen Hunt is a 67 y.o. female admitted directly from the cancer center today for abnormal labs.  The patient presented to the cancer center today for a regular office visit and reported that she had not been feeling well.  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  Apparently she had recently started rucaparib and 7 days later she started feeling very weak.  She is also nauseous.  She denied emesis.  She has had some diarrhea.  Her weakness progressed to more severe weakness in the past couple of days.  Her oncologist told her to stop taking the rucaparib.  She had labs done in the oncology center today that revealed an acute renal insufficiency with creatinine of 4.87, BUN of 62.  The potassium was elevated at 5.2.  Her baseline creatinine is noted to be 1.3.  She is still urinating.  IV fluids were started in the cancer center as she is starting to feel better.  The patient has type 2 diabetes mellitus on an insulin pump and reports that her blood sugars have been stable although she has had to reduce her basal rates and prandial boluses in the last several days.   Review of Systems: All systems reviewed and apart from history of presenting illness, are negative.  Past Medical History:  Diagnosis Date  . Diabetes mellitus without complication (HCC)    on insulin pump  . Dysrhythmia   . 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 Family history of breast cancer   . GERD (gastroesophageal reflux disease)   . History of blood transfusion   . History of bronchitis   . History of chemotherapy   . History of urinary tract infection   . Hyperlipidemia   . Hypertension   . Low serum vitamin D   . Ovarian cancer ([place]) 05/09/2016  . Shingles     Past Surgical History:  Procedure Laterality Date  . ABDOMINAL HYSTERECTOMY  10/14/2016  . CESAREAN SECTION    . DEBULKING N/A 10/14/2016   Procedure: DEBULKING;  Surgeon: Everitt Amber, MD;  Location: [facility];  Service: Gynecology;  Laterality: N/A;  . IR PARACENTESIS  06/01/2017  . LAPAROTOMY WITH STAGING N/A 10/14/2016   Procedure: LAPAROTOMY WITH OMENTECTOMY AND TUMOR DEBULGING;  Surgeon: Everitt Amber, MD;  Location: [facility];  Service: Gynecology;  Laterality: N/A;  . LUMBAR FUSION  08/21/15   L3-L4 Dr. Timmothy Euler  . OMENTECTOMY N/A 10/14/2016   Procedure: OMENTECTOMY;  Surgeon: Everitt Amber, MD;  Location: [facility];  Service: Gynecology;  Laterality: N/A;  . ROBOTIC ASSISTED TOTAL HYSTERECTOMY WITH BILATERAL SALPINGO OOPHERECTOMY Bilateral 10/14/2016   Procedure: XI ROBOTIC ASSISTED TOTAL LAPARSCOPIC  HYSTERECTOMY WITH BILATERAL SALPINGO OOPHORECTOMY;  Surgeon: Everitt Amber, MD;  Location: [facility];  Service: Gynecology;  Laterality: Bilateral;   Social History:  reports that she has never smoked. She has never used smokeless tobacco. She reports that she does not drink alcohol or use drugs.  Allergies  Allergen Reactions  . Avandia [Rosiglitazone] Other (See Comments)    Legs swelled  . Micronase [Glyburide] Swelling  . Actos [Pioglitazone] Other (See Comments)    Edema / leg swelling    Family History  Problem Relation Age of Onset  . Lung cancer Mother  smoker; dx in her 40s  . Leukemia Father   . Diabetes Paternal Grandmother   . Heart attack Paternal Grandmother   . Diabetes Paternal Grandfather   . Breast cancer Paternal Aunt        dx in her 11s-30s  . Leukemia Paternal Uncle   . Heart attack Maternal Grandfather   . Breast cancer Cousin        maternal first cousin    Prior to Admission medications   Medication Sig Start Date End Date Taking? Authorizing Provider  atorvastatin (LIPITOR) 40 MG tablet Take 1 tablet (40 mg total) by mouth daily. 10/07/18   Eustaquio Maize, MD  benazepril (LOTENSIN) 10 MG tablet Take 0.5 tablets (5 mg total) by mouth daily. 10/07/18   Eustaquio Maize, MD  Biotin 1000 MCG tablet Take 1,000 mcg by mouth 3 (three) times daily.    [provider]  furosemide (LASIX) 40 MG tablet Take 1 tablet (40 mg total) by mouth 2 (two) times daily. 07/07/18   Heath Lark, MD  glucose blood (ONE TOUCH ULTRA TEST) test strip USE TO CHECK BLOOD SUGAR UP TO 5 TIMES A DAY 09/06/18   Philemon Kingdom, MD  HYDROcodone-acetaminophen (NORCO) 10-325 MG tablet Take 1 tablet by mouth every 4 (four) hours as needed. 10/04/18   Lockamy, Randi L, NP-C  insulin lispro (HUMALOG) 100 UNIT/ML injection USE IN INSULIN PUMP AS DIRECTED. MAX DAILY DOSE OF 110 UNITS PER DAY. 09/27/18   Philemon Kingdom, MD  lidocaine-prilocaine (EMLA) cream Apply a quarter size amount to port site 1 hour prior to chemo. Do not rub in. Cover with plastic wrap. 12/09/17   Heath Lark, MD  loperamide (IMODIUM A-D) 2 MG tablet Take 1 tablet (2 mg total) by mouth 4 (four) times daily as needed for diarrhea or loose stools. 08/05/17   Dhungel, Flonnie Overman, MD  loratadine (CLARITIN) 10 MG tablet Take 10 mg by mouth daily.    [provider]  magnesium oxide (MAG-OX) 400 (241.3 Mg) MG tablet Take 1 tablet (400 mg total) by mouth 3 (three) times daily. 05/20/18   Heath Lark, MD  metFORMIN (GLUCOPHAGE) 1000 MG tablet TAKE 1 TABLET BY MOUTH TWICE DAILY WITH A MEAL 12/30/17   Philemon Kingdom, MD  Multiple Vitamin (MULTIVITAMIN WITH MINERALS) TABS Take 1 tablet by mouth daily.    [provider]  multivitamin-lutein (OCUVITE-LUTEIN) CAPS capsule Take 1 capsule by mouth daily.    [provider]  omeprazole (PRILOSEC) 40 MG capsule Take 1 capsule (40 mg total) by mouth daily. 09/15/18   Derek Jack, MD  ondansetron (ZOFRAN) 8 MG tablet Take 1 tablet (8 mg total) by mouth every 8 (eight) hours as needed for nausea or vomiting. 07/16/18   Heath Lark, MD    polyethylene glycol (MIRALAX / GLYCOLAX) packet Take 17 g by mouth daily as needed for mild constipation.     [provider]  prochlorperazine (COMPAZINE) 10 MG tablet Take 1 tablet (10 mg total) by mouth every 6 (six) hours as needed for nausea or vomiting. 07/16/18   Heath Lark, MD  rucaparib camsylate (RUBRACA) 300 MG tablet Take 2 tablets (600 mg total) by mouth 2 (two) times daily. Patient not taking: Reported on 10/08/2018 09/16/18   Derek Jack, MD  senna (SENOKOT) 8.6 MG TABS tablet Take 1 tablet (8.6 mg total) by mouth at bedtime as needed for moderate constipation. 10/15/16   Everitt Amber, MD  vitamin B-12 (CYANOCOBALAMIN) 1000 MCG tablet Take 1,000  mcg by mouth daily.    [provider]  vitamin E (VITAMIN E) 400 UNIT capsule Take 400 Units by mouth daily.    [provider]   Physical Exam: Vitals:   10/08/18 1434  BP: 119/81  Pulse: 75  Resp: 16  Temp: 97.9 F (36.6 C)  TempSrc: Oral  SpO2: 100%     General exam: morbidly obese female sitting in chair, cooperative, in no obvious distress.  Oriented x 4.   Head, eyes and ENT: Nontraumatic and normocephalic. Pupils equally reacting to light and accommodation. Oral mucosa dry and pale.  Neck: Supple. No JVD, carotid bruit or thyromegaly.  Lymphatics: No lymphadenopathy.  Respiratory system: Clear to auscultation. No increased work of breathing.  Cardiovascular system: S1 and S2 heard, RRR. No JVD. 2+ pedal edema.   Gastrointestinal system: Abdomen is gravid, appears to have ascites soft and nontender. Normal bowel sounds heard. No organomegaly or masses appreciated.  Central nervous system: Alert and oriented. No focal neurological deficits.  Extremities: Symmetric 5 x 5 power. Peripheral pulses symmetrically felt. 1+ dependent edema bilateral LEs.  Skin: No rashes or acute findings.  Musculoskeletal system: Negative exam.  Psychiatry: Pleasant and cooperative.  Labs on  Admission:  Basic Metabolic Panel: Recent Labs  Lab 10/08/18 0910  NA 133*  K 5.2*  CL 98  CO2 24  GLUCOSE 148*  BUN 62*  CREATININE 4.87*  CALCIUM 8.1*  MG 3.7*   Liver Function Tests: Recent Labs  Lab 10/08/18 0910  AST 27  ALT 18  ALKPHOS 82  BILITOT 0.6  PROT 5.8*  ALBUMIN 3.1*   No results for input(s): LIPASE, AMYLASE in the last 168 hours. No results for input(s): AMMONIA in the last 168 hours. CBC: Recent Labs  Lab 10/08/18 0910  WBC 7.2  NEUTROABS 5.6  HGB 8.1*  HCT 27.3*  MCV 103.4*  PLT 173   Cardiac Enzymes: No results for input(s): CKTOTAL, CKMB, CKMBINDEX, TROPONINI in the last 168 hours.  BNP (last 3 results) No results for input(s): PROBNP in the last 8760 hours. CBG: No results for input(s): GLUCAP in the last 168 hours.  Radiological Exams on Admission: No results found.  EKG: Independently reviewed. PENDING  Assessment/Plan Active Problems:   Type 2 diabetes mellitus with complication, with long term current use of insulin pump (HCC)   Hypertension   Severe obesity (BMI >= 40) (HCC)   Glaucoma   Anemia due to antineoplastic chemotherapy   Peripheral neuropathy due to chemotherapy (HCC)   CKD (chronic kidney disease), stage III (HCC)   Hyperkalemia   Severe dehydration   Hypermagnesemia   Hyperglycemia   Insulin pump in place   Acute renal failure superimposed on stage 3 chronic kidney disease ([place])   1. Acute renal failure on stage III CKD-likely exacerbated by diuretics, dehydration and possibly her antineoplastic agent rucaparib which has been discontinued by her oncologist.  Her creatinine is up to 4 from a baseline of 1.3.  We are starting gentle hydration with IV fluids.  I am requesting a nephrology consult.  Avoid nephrotoxic agents.  Discontinue metformin.  Discontinue benazepril.  Check a urinalysis.  2. Hyperkalemia - Lokelma 10 gm oral dose x 1. Follow renal function panel.  3. Hypermagnesmia - Pt tells me that she  is taking oral magnesium in large doses. Hold supplemental magnesium.  Follow levels.  4. 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  She has been on Lasix to maintain fluid balance however Lasix is currently being held due to acute kidney injury.  The patient's chemotherapy agent has been held by her oncologist.  Follow-up with oncology after discharge. 5. Type 2 diabetes mellitus with neurological complications-patient is maintained well on an insulin pump.  She is alert oriented and feels comfortable managing her insulin pump in the hospital.  I have ordered the insulin pump order set we will continue to monitor closely 5 times per day.  Patient was advised that if she is not feeling well to manage her pump then she should let us know immediately and we will discontinue the pump and treated with basal and prandial cutaneous insulin.  Patient verbalized understanding.  She feels that she can manage her pump fairly well in the hospital. 6. Cancer related pain-patient has been prescribed oxycodone IR 5 mg every 4 hours as needed.  She normally takes 5 hydrocodone 10 tablets/day according to records. 7. Peripheral neuropathy- likely secondary to diabetes and chemotherapy treatments-she has taken gabapentin in the past but is not currently taking it any longer.  Would not be able to use it in the setting of acute renal failure.  DVT Prophylaxis: lovenox Code Status: Full   Family Communication: husband   Disposition Plan: Home when medically stabilized   Time spent: 74 minutes   Irwin Brakeman, MD [facility] Pager [phone]  If 7PM-7AM, please contact night-coverage [email or web address] Password [email or web address] 10/08/2018, 3:15 PM

## 2018-10-08 NOTE — Progress Notes (Signed)
Patient stated that she was having trouble urinating. Bladder scan showed 645 ml's. Pt was able to urinate 250 ml's of cloudy, yellow urine after scan. Mid Level notified. Patient refusing in and out cath at this time. Patient states she no longer feels like she needs to urinate at this time. Will continue to monitor.

## 2018-10-08 NOTE — Progress Notes (Signed)
Patient came in today for an office visit. Labs were done and showed acute kidney failure. Will give IV fluids per MD. Dr. Delton Coombes will be admitting patient and will wait on bed assignment.  Report given to Lowry Ram RN. Will transport patient to room 309 via wheelchair with her husband at her side.Vitals stable and discharged from the clinic via wheelchair. Follow up as scheduled.

## 2018-10-08 NOTE — Patient Instructions (Signed)
[facility] at [facility] Discharge Instructions     Thank you for choosing [facility] at [facility] to provide your oncology and hematology care.  To afford each patient quality time with our provider, please arrive at least 15 minutes before your scheduled appointment time.   If you have a lab appointment with the Cancer Center please come in thru the  Main Entrance and check in at the main information desk  You need to re-schedule your appointment should you arrive 10 or more minutes late.  We strive to give you quality time with our providers, and arriving late affects you and other patients whose appointments are after yours.  Also, if you no show three or more times for appointments you may be dismissed from the clinic at the providers discretion.     Again, thank you for choosing [facility].  Our hope is that these requests will decrease the amount of time that you wait before being seen by our physicians.       _____________________________________________________________  Should you have questions after your visit to [facility], please contact our office at [phone] between the hours of 8:00 a.m. and 4:30 p.m.  Voicemails left after 4:00 p.m. will not be returned until the following business day.  For prescription refill requests, have your pharmacy contact our office and allow 72 hours.    Cancer Center Support Programs:   > Cancer Support Group  2nd Tuesday of the month 1pm-2pm, Journey Room    

## 2018-10-08 NOTE — Assessment & Plan Note (Signed)
1.  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 -Underwent 7 cycles of chemotherapy with carboplatin and paclitaxel from 06/01/2016 through 09/18/2016. -Germline mutation testing on 06/25/2016 was negative. - Underwent robotic assisted tumor debulking surgery on 10/14/2016 by Dr. Denman George. -3 cycles of chemotherapy with carboplatin and paclitaxel from 11/06/2016 through 12/25/2016. - Second line therapy with Doxil and bevacizumab from 05/25/2017 through 07/07/2017. - Third line therapy with gemcitabine from 07/30/2017 through December 2018, gemcitabine and cisplatin day 1 and day 8 q. 21 days from 11/02/2017, 7 cycles completed on 06/14/2018, with CT scan on 07/01/2018 showing progression. -She underwent paracentesis, with 4 L removed on 07/08/2018.  There is a subcutaneous area of skin thickening at the paracentesis site, questionable for seeding. - 3 cycles of docetaxel 75 mg/m from 07/16/2018 through 08/26/2018. - Ca1 25 levels on 07/01/2018 was 2005, 2367 on day of cycle 2, 2333 on day of cycle 3. -We discussed the results of the CT scan CAP which showed progression of her disease.  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  FDA approved therapeutic option rucaparib was suggested. - Rucaparib 600 mg twice daily started on 09/25/2018. -She had paracentesis on 09/21/2018, 7 L fluid removed. - After she started rucaparib, 7 days later she started feeling very weak.  She was also nauseous.  She was taking Compazine every 6 hours.  She denied any vomiting.  She did have some diarrhea. -When she called [place] with severe weakness, we have told her to hold rucaparib on 10/05/2018. -Today she is still feeling very weak.  I reviewed blood work.  She developed acute renal insufficiency with creatinine of 4.87, BUN of  62.  Potassium was elevated at 5.2. - Rucaparib by itself can cause increased creatinine.  However she is also on Lasix and losartan which can worsen creatinine.  I will hold them temporarily. -I have suggested her to be admitted to the hospital for IV hydration.  Nephrology consultation will also be requested. -I have talked to Dr. Wynetta Emery who kindly agreed to admit this patient to the floor.  2.  Malignant ascites: -Paracentesis on 07/08/2018, 4 L removed. -Paracentesis on 09/21/2018, 7 L removed. -We will hold her Lasix until creatinine improves.  3.  CKD: -She has mild CKD with a creatinine of 1.3 baseline.  However she developed acute renal insufficiency.  She is putting out enough urine. -We will hydrate her and admit her to the hospital.  4.  Diabetes type 2: -She is on insulin pump and her sugars are fairly well controlled.  5.  Cancer related pain: -She has abdominal and low back pain.  She is taking hydrocodone 10 mg 4 to 5 tablets/day.  6.  Peripheral neuropathy: -She has on and off numbness in the toes.  No numbness in the hands reported.  She was prescribed gabapentin few months ago but she is not taking it.

## 2018-10-09 ENCOUNTER — Inpatient Hospital Stay (HOSPITAL_COMMUNITY): Payer: Medicare Other

## 2018-10-09 DIAGNOSIS — E118 Type 2 diabetes mellitus with unspecified complications: Secondary | ICD-10-CM

## 2018-10-09 LAB — CBC
HCT: 25.7 % — ABNORMAL LOW (ref 36.0–46.0)
Hemoglobin: 7.6 g/dL — ABNORMAL LOW (ref 12.0–15.0)
MCH: 30.8 pg (ref 26.0–34.0)
MCHC: 29.6 g/dL — ABNORMAL LOW (ref 30.0–36.0)
MCV: 104 fL — ABNORMAL HIGH (ref 80.0–100.0)
Platelets: 161 10*3/uL (ref 150–400)
RBC: 2.47 MIL/uL — ABNORMAL LOW (ref 3.87–5.11)
RDW: 16.8 % — ABNORMAL HIGH (ref 11.5–15.5)
WBC: 6.2 10*3/uL (ref 4.0–10.5)
nRBC: 0 % (ref 0.0–0.2)

## 2018-10-09 LAB — RENAL FUNCTION PANEL
ALBUMIN: 3 g/dL — AB (ref 3.5–5.0)
Anion gap: 11 (ref 5–15)
BUN: 64 mg/dL — ABNORMAL HIGH (ref 8–23)
CO2: 22 mmol/L (ref 22–32)
Calcium: 7.9 mg/dL — ABNORMAL LOW (ref 8.9–10.3)
Chloride: 101 mmol/L (ref 98–111)
Creatinine, Ser: 5.02 mg/dL — ABNORMAL HIGH (ref 0.44–1.00)
GFR, EST AFRICAN AMERICAN: 10 mL/min — AB (ref >60)
GFR, EST NON AFRICAN AMERICAN: 8 mL/min — AB (ref >60)
Glucose, Bld: 108 mg/dL — ABNORMAL HIGH (ref 70–99)
Phosphorus: 5.5 mg/dL — ABNORMAL HIGH (ref 2.5–4.6)
Potassium: 5.1 mmol/L (ref 3.5–5.1)
Sodium: 134 mmol/L — ABNORMAL LOW (ref 135–145)

## 2018-10-09 LAB — GLUCOSE, CAPILLARY
Glucose-Capillary: 106 mg/dL — ABNORMAL HIGH (ref 70–99)
Glucose-Capillary: 100 mg/dL — ABNORMAL HIGH (ref 70–99)
Glucose-Capillary: 119 mg/dL — ABNORMAL HIGH (ref 70–99)
Glucose-Capillary: 86 mg/dL (ref 70–99)
Glucose-Capillary: 67 mg/dL — ABNORMAL LOW (ref 70–99)
Glucose-Capillary: 87 mg/dL (ref 70–99)
Glucose-Capillary: 72 mg/dL (ref 70–99)

## 2018-10-09 LAB — CREATININE, URINE, RANDOM: Creatinine, Urine: 115.33 mg/dL

## 2018-10-09 LAB — HIV ANTIBODY (ROUTINE TESTING W REFLEX): HIV Screen 4th Generation wRfx: NONREACTIVE

## 2018-10-09 LAB — CA 125: Cancer Antigen (CA) 125: 1898 U/mL — ABNORMAL HIGH (ref 0.0–38.1)

## 2018-10-09 LAB — MAGNESIUM: Magnesium: 3.5 mg/dL — ABNORMAL HIGH (ref 1.7–2.4)

## 2018-10-09 LAB — SODIUM, URINE, RANDOM: Sodium, Ur: 31 mmol/L

## 2018-10-09 MED ORDER — ALBUMIN HUMAN 25 % IV SOLN
INTRAVENOUS | Status: AC
  Administered 2018-10-09: 100 g via INTRAVENOUS
  Filled 2018-10-09: qty 200

## 2018-10-09 MED ORDER — HYDROCODONE-ACETAMINOPHEN 10-325 MG PO TABS
1.0000 | ORAL_TABLET | ORAL | Status: DC | PRN
  Administered 2018-10-09 – 2018-10-11 (×14): 1 via ORAL
  Filled 2018-10-09 (×14): qty 1

## 2018-10-09 MED ORDER — ALBUMIN HUMAN 25 % IV SOLN
100.0000 g | Freq: Every day | INTRAVENOUS | Status: AC
  Administered 2018-10-09 – 2018-10-10 (×2): 100 g via INTRAVENOUS
  Administered 2018-10-11: 50 g via INTRAVENOUS
  Filled 2018-10-09: qty 400
  Filled 2018-10-09: qty 200
  Filled 2018-10-09: qty 400
  Filled 2018-10-09: qty 200
  Filled 2018-10-09: qty 400

## 2018-10-09 MED ORDER — GLUCOSE 40 % PO GEL
ORAL | Status: AC
  Filled 2018-10-09: qty 1

## 2018-10-09 NOTE — Progress Notes (Signed)
PROGRESS NOTE    Ellen Hunt  MGQ:[id number] DOB: 1950-12-05 DOA: 10/08/2018 PCP: Eustaquio Maize, MD    Brief Narrative:  67 year old female with a history of primary peritoneal carcinoma, was admitted to the hospital directly from the cancer center for abnormal labs.  Patient had not been feeling well.  She was being treated at the cancer center for high-grade serous carcinoma of the peritoneum.  She has been on chemotherapy and has felt increasingly weak and nauseous.  She is found to have an elevated creatinine of 4.87 with a baseline creatinine of 1.3.  Potassium was mildly elevated at 5.2.  She was started on IV fluids and referred for admission.  Since admission, her renal function has not had any significant improvement.  Nephrology has been consulted.   Assessment & Plan:   Active Problems:   Type 2 diabetes mellitus with complication, with long term current use of insulin pump (HCC)   Hypertension   Severe obesity (BMI >= 40) (HCC)   Glaucoma   Anemia due to antineoplastic chemotherapy   Peripheral neuropathy due to chemotherapy (HCC)   CKD (chronic kidney disease), stage III (HCC)   Hyperkalemia   Severe dehydration   Hypermagnesemia   Hyperglycemia   Insulin pump in place   Acute renal failure superimposed on stage 3 chronic kidney disease ([place])   1. Acute renal failure on stage III CKD-likely exacerbated by diuretics, dehydration and possibly her antineoplastic agent rucaparib which has been discontinued by her oncologist.  Her creatinine is up to 5 from a baseline of 1.3.  ACE inhibitor has been discontinued.  Nephrology consulted.  Started on albumin infusion.  CT abdomen ordered to rule out any obstructive process in renal system.  Continue to follow urine output. 2. Hyperkalemia -treated with lokelma.  Follow-up potassiums are better.  3. Hypermagnesmia - Pt tells me that she is taking oral magnesium in large doses. Hold supplemental magnesium.  Follow levels.   4. 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  She has been on Lasix to maintain fluid balance however Lasix is currently being held due to acute kidney injury.  The patient's chemotherapy agent has been held by her oncologist.  Follow-up with oncology after discharge. Will need to re evaluate on Monday regarding need for repeat paracentesis. 5. Type 2 diabetes mellitus with neurological complications-patient is maintained well on an insulin pump.  She is alert oriented and feels comfortable managing her insulin pump in the hospital. Patient was advised that if she is not feeling well to manage her pump then she should let us know immediately and we will discontinue the pump and treated with basal and prandial cutaneous insulin.  Patient verbalized understanding.  She feels that she can manage her pump fairly well in the hospital. 6. Cancer related pain-patient has been prescribed oxycodone IR 5 mg every 4 hours as needed.  She normally takes 5 hydrocodone 10 tablets/day according to records. 7. Peripheral neuropathy- likely secondary to diabetes and chemotherapy treatments-she has taken gabapentin in the past but is not currently taking it any longer.  Would not be able to use it in the setting of acute renal failure. 8. Possible UTI.  Urinalysis indicates possible UTI.  Currently on ceftriaxone.  Follow-up urine culture. 9. Anemia.  No evidence of bleeding.  Appears to be a chronic issue with baseline hemoglobin between 8-9.  Currently hemoglobin  is 7.6.  Possibly related to chemotherapy.  Anemia panel has been requested.   DVT prophylaxis: Lovenox Code Status: Full code Family Communication: Discussed with husband at the bedside Disposition Plan: Discharge home once improved   Consultants:   Nephrology  Procedures:     Antimicrobials:   Ceftriaxone 12/7  >   Subjective: Had a loose stool earlier today.  Still feels weak and tired.  Denies any shortness of breath.  Objective: Vitals:   10/08/18 1658 10/08/18 2137 10/09/18 0524 10/09/18 1511  BP: (!) 159/49 (!) 134/48 (!) 126/39 (!) 127/44  Pulse: 91 82 82 79  Resp:  20 18   Temp:  98.1 F (36.7 C) 98.6 F (37 C) (!) 97.4 F (36.3 C)  TempSrc:  Oral Oral Oral  SpO2:  97% 95% 96%  Weight:      Height:        Intake/Output Summary (Last 24 hours) at 10/09/2018 1931 Last data filed at 10/09/2018 1900 Gross per 24 hour  Intake 1750.41 ml  Output 600 ml  Net 1150.41 ml   Filed Weights   10/08/18 1600  Weight: 102.5 kg    Examination:  General exam: Appears calm and comfortable  Respiratory system: Clear to auscultation. Respiratory effort normal. Cardiovascular system: S1 & S2 heard, RRR. No JVD, murmurs, rubs, gallops or clicks.  1-2+ pitting edema bilaterally Gastrointestinal system: Abdomen is distended, soft and nontender. No organomegaly or masses felt. Normal bowel sounds heard. Central nervous system: Alert and oriented. No focal neurological deficits. Extremities: Symmetric 5 x 5 power. Skin: No rashes, lesions or ulcers Psychiatry: Judgement and insight appear normal. Mood & affect appropriate.     Data Reviewed: I have personally reviewed following labs and imaging studies  CBC: Recent Labs  Lab 10/08/18 0910 10/09/18 0623  WBC 7.2 6.2  NEUTROABS 5.6  --   HGB 8.1* 7.6*  HCT 27.3* 25.7*  MCV 103.4* 104.0*  PLT 173 710   Basic Metabolic Panel: Recent Labs  Lab 10/08/18 0910 10/09/18 0623  NA 133* 134*  K 5.2* 5.1  CL 98 101  CO2 24 22  GLUCOSE 148* 108*  BUN 62* 64*  CREATININE 4.87* 5.02*  CALCIUM 8.1* 7.9*  MG 3.7* 3.5*  PHOS  --  5.5*   GFR: Estimated Creatinine Clearance: 12.8 mL/min (A) (by C-G formula based on SCr of 5.02 mg/dL (H)). Liver Function Tests: Recent Labs  Lab 10/08/18 0910 10/09/18 0623  AST 27  --   ALT 18  --    ALKPHOS 82  --   BILITOT 0.6  --   PROT 5.8*  --   ALBUMIN 3.1* 3.0*   No results for input(s): LIPASE, AMYLASE in the last 168 hours. No results for input(s): AMMONIA in the last 168 hours. Coagulation Profile: No results for input(s): INR, PROTIME in the last 168 hours. Cardiac Enzymes: No results for input(s): CKTOTAL, CKMB, CKMBINDEX, TROPONINI in the last 168 hours. BNP (last 3 results) No results for input(s): PROBNP in the last 8760 hours. HbA1C: No results for input(s): HGBA1C in the last 72 hours. CBG: Recent Labs  Lab 10/08/18 2354 10/09/18 0143 10/09/18 0721 10/09/18 1118 10/09/18 1630  GLUCAP 91 106* 100* 119* 86   Lipid Profile: No results for input(s): CHOL, HDL, LDLCALC, TRIG, CHOLHDL, LDLDIRECT in the last 72 hours. Thyroid Function Tests: No results for input(s): TSH, T4TOTAL, FREET4, T3FREE, THYROIDAB in the last 72 hours. Anemia Panel: No results for input(s): VITAMINB12, FOLATE, FERRITIN, TIBC, IRON,  RETICCTPCT in the last 72 hours. Sepsis Labs: No results for input(s): PROCALCITON, LATICACIDVEN in the last 168 hours.  No results found for this or any previous visit (from the past 240 hour(s)).       Radiology Studies: Ct Renal Stone Study  Result Date: 10/09/2018 CLINICAL DATA:  High-grade peritoneal carcinomatosis. Abdominal pain. Possible obstruction. EXAM: CT ABDOMEN AND PELVIS WITHOUT CONTRAST TECHNIQUE: Multidetector CT imaging of the abdomen and pelvis was performed following the standard protocol without IV contrast. COMPARISON:  Multiple prior CT scans from 2019. The most recent is 09/13/2018 FINDINGS: Lower chest: No worrisome pulmonary nodules or pleural effusion. The heart is normal in size. No pericardial effusion. Hepatobiliary: No focal hepatic lesions or intrahepatic biliary dilatation. Gallbladder is distended but no obvious findings for acute cholecystitis. No common bile duct dilatation. Pancreas: No mass, inflammation or ductal  dilatation. Spleen: Numerous small calcified granulomas but no focal lesions. Adrenals/Urinary Tract: Stable left adrenal gland nodule. The right adrenal gland is normal. No significant renal abnormalities other than a lower pole left renal calculus. No hydronephrosis. The bladder is grossly normal. Stomach/Bowel: The stomach, duodenum, small bowel and colon are grossly normal. No acute inflammatory changes or obstructive findings. There is a stable 4.2 x 3.2 cm mass associated with the transverse colon which is likely a peritoneal implant. Vascular/Lymphatic: Stable vascular calcifications.  No aneurysm. No mesenteric or retroperitoneal adenopathy. Stable appearing omental caking and peritoneal tumor. Nodular enhancing tissue in the gastrohepatic ligament is stable measuring a maximum of 26 x 22 mm on image number 28. Reproductive: Surgically absent. Other: Persistent large volume abdominal/pelvic ascites. Musculoskeletal: No significant bony findings. Interbody fusion changes at L3-4 with advanced degenerative changes at L2-3 and L4-5. IMPRESSION: 1. Overall stable CT appearance of the abdomen/pelvis since prior study 3 weeks ago. Stable omental and peritoneal surface disease and large volume abdominal/pelvic ascites. 2. Stable large peritoneal implant involving the transverse colon. 3. No findings for bowel obstruction. 4. Moderate gallbladder distension with possible sludge but no definite findings for acute cholecystitis. 5. Lower pole left renal calculus. Electronically Signed   By: Marijo Sanes M.D.   On: 10/09/2018 18:02        Scheduled Meds: . enoxaparin (LOVENOX) injection  30 mg Subcutaneous Q24H  . insulin pump   Subcutaneous TID AC, HS, 0200  . nystatin   Topical TID   Continuous Infusions: . albumin human 100 g (10/09/18 1628)  . cefTRIAXone (ROCEPHIN)  IV 1 g (10/09/18 0119)     LOS: 1 day    Time spent: 56mins    Kathie Dike, MD [facility] Pager  [phone]  If 7PM-7AM, please contact night-coverage [email or web address] Password [facility] 10/09/2018, 7:31 PM

## 2018-10-09 NOTE — Consult Note (Signed)
Reason for Consult: Acute kidney injury on chronic kidney disease stage III Referring Physician: Kathie Dike MD [facility])  HPI:  [name] past medical history significant for hypertension, insulin-dependent diabetes mellitus, gastroesophageal reflux disease, dyslipidemia and ongoing treatment for high-grade serous carcinoma of the peritoneum for which she was recently started on Rupacarib.  Her past week has been demarcated by increasing pain in the lower extremities with nausea/weakness and poor oral intake that seemingly is from the newly started chemotherapy medication.  This was consequently stopped on 10/05/2018 and the patient directly admitted to the hospital when labs yesterday showed development of acute kidney injury with creatinine up to 4.9 from a baseline of 1.3 with concomitant hyperkalemia of 5.2.  Overnight urine output has been charted as 500 cc and she had problems with voiding urine earlier necessitating in and out catheterization.  She was taking furosemide, benazepril and metformin prior to admission.  She was also taking Advil 1 to 2 tablets daily for the past 2 or so weeks prior to admission for leg pain.  Also of note, she had 7 L paracentesis on 09/21/2018.  She denies any prior history of acute kidney injury, denies history of recurrent nephrolithiasis or history of recurrent UTI.  She reports generalized fatigue, dysgeusia and nausea limiting her oral intake-reports that fluid intake was optimal until 4 days ago.   07/01/2018  07/16/2018  08/05/2018  09/16/2018  10/08/2018  10/09/2018   BUN 26 (H) 33 (H) 32 (H) 30 (H) 62 (H) 64 (H)  Creatinine 1.29 (H) 1.28 (H) 1.31 (H) 1.34 (H) 4.87 (H) 5.02 (H)    Past Medical History:  Diagnosis Date  . Diabetes mellitus without complication (HCC)    on insulin pump  . Dysrhythmia   . 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[place]) 05/09/2016  . Family history of breast cancer   . GERD (gastroesophageal reflux disease)    . History of blood transfusion   . History of bronchitis   . History of chemotherapy   . History of urinary tract infection   . Hyperlipidemia   . Hypertension   . Low serum vitamin D   . Ovarian cancer ([place]) 05/09/2016  . Shingles     Past Surgical History:  Procedure Laterality Date  . ABDOMINAL HYSTERECTOMY  10/14/2016  . CESAREAN SECTION    . DEBULKING N/A 10/14/2016   Procedure: DEBULKING;  Surgeon: Everitt Amber, MD;  Location: [facility];  Service: Gynecology;  Laterality: N/A;  . IR PARACENTESIS  06/01/2017  . LAPAROTOMY WITH STAGING N/A 10/14/2016   Procedure: LAPAROTOMY WITH OMENTECTOMY AND TUMOR DEBULGING;  Surgeon: Everitt Amber, MD;  Location: [facility];  Service: Gynecology;  Laterality: N/A;  . LUMBAR FUSION  08/21/15   L3-L4 Dr. Timmothy Euler  . OMENTECTOMY N/A 10/14/2016   Procedure: OMENTECTOMY;  Surgeon: Everitt Amber, MD;  Location: [facility];  Service: Gynecology;  Laterality: N/A;  . ROBOTIC ASSISTED TOTAL HYSTERECTOMY WITH BILATERAL SALPINGO OOPHERECTOMY Bilateral 10/14/2016   Procedure: XI ROBOTIC ASSISTED TOTAL LAPARSCOPIC  HYSTERECTOMY WITH BILATERAL SALPINGO OOPHORECTOMY;  Surgeon: Everitt Amber, MD;  Location: [facility];  Service: Gynecology;  Laterality: Bilateral;    Family History  Problem Relation Age of Onset  . Lung cancer Mother        smoker; dx in her 33s  . Leukemia Father   . Diabetes Paternal Grandmother   . Heart attack Paternal Grandmother   . Diabetes Paternal Grandfather   . Breast cancer Paternal Aunt  dx in her 20s-30s  . Leukemia Paternal Uncle   . Heart attack Maternal Grandfather   . Breast cancer Cousin        maternal first cousin    Social History:  reports that she has never smoked. She has never used smokeless tobacco. She reports that she does not drink alcohol or use drugs.  Allergies:  Allergies  Allergen Reactions  . Avandia [Rosiglitazone] Other (See Comments)    Legs swelled  . Micronase [Glyburide] Swelling  . Actos  [Pioglitazone] Other (See Comments)    Edema / leg swelling    Medications:  Scheduled: . enoxaparin (LOVENOX) injection  30 mg Subcutaneous Q24H  . insulin pump   Subcutaneous TID AC, HS, 0200  . nystatin   Topical TID    BMP Latest Ref Rng & Units 10/09/2018 10/08/2018 09/16/2018  Glucose 70 - 99 mg/dL 108(H) 148(H) 100(H)  BUN 8 - 23 mg/dL 64(H) 62(H) 30(H)  Creatinine 0.44 - 1.00 mg/dL 5.02(H) 4.87(H) 1.34(H)  BUN/Creat Ratio 12 - 28 - - -  Sodium 135 - 145 mmol/L 134(L) 133(L) 138  Potassium 3.5 - 5.1 mmol/L 5.1 5.2(H) 4.1  Chloride 98 - 111 mmol/L 101 98 100  CO2 22 - 32 mmol/L 22 24 25   Calcium 8.9 - 10.3 mg/dL 7.9(L) 8.1(L) 9.3   CBC Latest Ref Rng & Units 10/09/2018 10/08/2018 09/16/2018  WBC 4.0 - 10.5 K/uL 6.2 7.2 15.6(H)  Hemoglobin 12.0 - 15.0 g/dL 7.6(L) 8.1(L) 8.4(L)  Hematocrit 36.0 - 46.0 % 25.7(L) 27.3(L) 28.2(L)  Platelets 150 - 400 K/uL 161 173 222     No results found.  Review of Systems  Constitutional: Positive for malaise/fatigue. Negative for chills and fever.  HENT: Negative.   Eyes: Negative.   Respiratory: Negative.   Cardiovascular: Positive for leg swelling. Negative for chest pain, palpitations and orthopnea.  Gastrointestinal: Positive for nausea and vomiting. Negative for blood in stool and diarrhea.       Severe abdominal distention with discomfort  Genitourinary: Negative.   Musculoskeletal: Negative.   Skin: Negative.    Blood pressure (!) 126/39, pulse 82, temperature 98.6 F (37 C), temperature source Oral, resp. rate 18, height 5\' 3"  (1.6 m), weight 102.5 kg, SpO2 95 %. Physical Exam  Nursing note and vitals reviewed. Constitutional: She appears well-developed and well-nourished.  Sitting uncomfortably on recliner  HENT:  Head: Normocephalic.  Nose: Nose normal.  Alopecic from chemotherapy, dry oral mucosa/mucosal membranes  Eyes: Pupils are equal, round, and reactive to light. EOM are normal.  Pale conjunctiva  Neck: Normal  range of motion. Neck supple. No JVD present. No thyromegaly present.  Cardiovascular: Normal rate, regular rhythm and normal heart sounds.  No murmur heard. Respiratory: Breath sounds normal. She has no wheezes. She has no rales.  Poor inspiratory effort with decreased breath sounds over bases, no distinct rales or rhonchi  GI: Soft. Bowel sounds are normal. She exhibits distension. There is no tenderness.  Moderate to severe global distention without focal tenderness  Musculoskeletal: She exhibits edema.  2+ lower extremity pitting edema  Neurological: She is alert.  Skin: Skin is warm and dry. Rash noted.  Psychiatric: Thought content normal.    Assessment/Plan: 1.  Acute kidney injury on chronic kidney disease stage III: Based on the history, timeline of events and available database, this appears to be hemodynamically mediated acute kidney injury in the setting of what appears to be volume depletion from diminished oral intake with ongoing ACE inhibitor/diuretic and  NSAID use.  Unfortunately, she has significant third spacing and potentially decreased effective arterial blood volume that might be compounding renal injury.  Based on the exam of her abdomen, another possible differential include intra-abdominal hypertension with renal vein congestion.  Rupacarib is also been reported to cause elevated creatinine based on product monograph.  CT scan of the abdomen and pelvis will be obtained today in order to evaluate and rule out obstruction.  I will switch her intravenous fluids to albumin at around 1 g/kilogram to try and intravascularly expand her and improve renal perfusion.  Continue to hold diuretics/ACE inhibitor at this time and support blood pressure/limit hypotensive episodes.  We discussed future avoidance of nonsteroidal anti-inflammatory drugs and avoidance of iodinated intravenous contrast unless it was for lifesaving maneuvers. 2.  Serous peritoneal carcinoma-high-grade:  Unfortunately with extensive involvement of peritoneal cavity and associated ascites.  Currently appears to have been intolerant of her fifth line chemotherapeutic agent. 3.  Anemia: Secondary to chronic disease/malignancy-continue to monitor and will check iron studies with tomorrow morning's labs.  May benefit from ESA. 4.  Hyperkalemia: Mild and corrected overnight with lokelma 5.  Diabetes mellitus: Glycemic management per primary service   Ketih Goodie K. 10/09/2018, 2:08 PM

## 2018-10-09 NOTE — Progress Notes (Signed)
Patient CBG obtained.  Patient states at home she usually removes the pump before bed and drinks a small glass of orange juice in the middle of the night to keep CBG within normal limits. No insulin received tonight. Pump has been removed by patient. Will continue to monitor.

## 2018-10-09 NOTE — Progress Notes (Addendum)
Hypoglycemic Event  CBG: 67  Treatment: 1/2 cup Soda  Symptoms: None  Follow-up CBG: Time: 2220 CBG Result: 87  Possible Reasons for Event: Poor PO intake  Comments/MD notified: [place], Mid Level    Ellen Hunt

## 2018-10-10 LAB — GLUCOSE, CAPILLARY
Glucose-Capillary: 112 mg/dL — ABNORMAL HIGH (ref 70–99)
Glucose-Capillary: 115 mg/dL — ABNORMAL HIGH (ref 70–99)
Glucose-Capillary: 147 mg/dL — ABNORMAL HIGH (ref 70–99)
Glucose-Capillary: 153 mg/dL — ABNORMAL HIGH (ref 70–99)
Glucose-Capillary: 157 mg/dL — ABNORMAL HIGH (ref 70–99)
Glucose-Capillary: 87 mg/dL (ref 70–99)
Glucose-Capillary: 97 mg/dL (ref 70–99)

## 2018-10-10 LAB — COMPREHENSIVE METABOLIC PANEL
ALT: 19 U/L (ref 0–44)
ANION GAP: 9 (ref 5–15)
AST: 27 U/L (ref 15–41)
Albumin: 3.5 g/dL (ref 3.5–5.0)
Alkaline Phosphatase: 70 U/L (ref 38–126)
BUN: 51 mg/dL — ABNORMAL HIGH (ref 8–23)
CO2: 22 mmol/L (ref 22–32)
Calcium: 8.4 mg/dL — ABNORMAL LOW (ref 8.9–10.3)
Chloride: 103 mmol/L (ref 98–111)
Creatinine, Ser: 3.92 mg/dL — ABNORMAL HIGH (ref 0.44–1.00)
GFR calc non Af Amer: 11 mL/min — ABNORMAL LOW (ref >60)
GFR, EST AFRICAN AMERICAN: 13 mL/min — AB (ref >60)
Glucose, Bld: 109 mg/dL — ABNORMAL HIGH (ref 70–99)
Potassium: 4.7 mmol/L (ref 3.5–5.1)
Sodium: 134 mmol/L — ABNORMAL LOW (ref 135–145)
Total Bilirubin: 0.6 mg/dL (ref 0.3–1.2)
Total Protein: 5.9 g/dL — ABNORMAL LOW (ref 6.5–8.1)

## 2018-10-10 LAB — CBC
HCT: 24.1 % — ABNORMAL LOW (ref 36.0–46.0)
HEMOGLOBIN: 7.3 g/dL — AB (ref 12.0–15.0)
MCH: 30.9 pg (ref 26.0–34.0)
MCHC: 30.3 g/dL (ref 30.0–36.0)
MCV: 102.1 fL — ABNORMAL HIGH (ref 80.0–100.0)
Platelets: 147 10*3/uL — ABNORMAL LOW (ref 150–400)
RBC: 2.36 MIL/uL — ABNORMAL LOW (ref 3.87–5.11)
RDW: 16.7 % — ABNORMAL HIGH (ref 11.5–15.5)
WBC: 5.7 10*3/uL (ref 4.0–10.5)
nRBC: 0 % (ref 0.0–0.2)

## 2018-10-10 LAB — IRON AND TIBC
Iron: 60 ug/dL (ref 28–170)
Saturation Ratios: 32 % — ABNORMAL HIGH (ref 10.4–31.8)
TIBC: 187 ug/dL — ABNORMAL LOW (ref 250–450)
UIBC: 127 ug/dL

## 2018-10-10 LAB — RENAL FUNCTION PANEL
Albumin: 3.4 g/dL — ABNORMAL LOW (ref 3.5–5.0)
Anion gap: 9 (ref 5–15)
BUN: 50 mg/dL — ABNORMAL HIGH (ref 8–23)
CHLORIDE: 102 mmol/L (ref 98–111)
CO2: 22 mmol/L (ref 22–32)
CREATININE: 3.88 mg/dL — AB (ref 0.44–1.00)
Calcium: 8.3 mg/dL — ABNORMAL LOW (ref 8.9–10.3)
GFR calc Af Amer: 13 mL/min — ABNORMAL LOW (ref >60)
GFR calc non Af Amer: 11 mL/min — ABNORMAL LOW (ref >60)
Glucose, Bld: 111 mg/dL — ABNORMAL HIGH (ref 70–99)
Phosphorus: 4.8 mg/dL — ABNORMAL HIGH (ref 2.5–4.6)
Potassium: 4.7 mmol/L (ref 3.5–5.1)
Sodium: 133 mmol/L — ABNORMAL LOW (ref 135–145)

## 2018-10-10 LAB — MAGNESIUM: Magnesium: 3.2 mg/dL — ABNORMAL HIGH (ref 1.7–2.4)

## 2018-10-10 LAB — FERRITIN: FERRITIN: 704 ng/mL — AB (ref 11–307)

## 2018-10-10 NOTE — Progress Notes (Signed)
PROGRESS NOTE    Ellen Hunt  GHW:[id number] DOB: 15-Oct-1951 DOA: 10/08/2018 PCP: Eustaquio Maize, MD    Brief Narrative:  67 year old female with a history of primary peritoneal carcinoma, was admitted to the hospital directly from the cancer center for abnormal labs.  Patient had not been feeling well.  She was being treated at the cancer center for high-grade serous carcinoma of the peritoneum.  She has been on chemotherapy and has felt increasingly weak and nauseous.  She is found to have an elevated creatinine of 4.87 with a baseline creatinine of 1.3.  Potassium was mildly elevated at 5.2.  She was started on IV fluids and referred for admission.  Since admission, her renal function has not had any significant improvement.  Nephrology has been consulted.   Assessment & Plan:   Active Problems:   Type 2 diabetes mellitus with complication, with long term current use of insulin pump (HCC)   Hypertension   Severe obesity (BMI >= 40) (HCC)   Glaucoma   Anemia due to antineoplastic chemotherapy   Peripheral neuropathy due to chemotherapy (HCC)   CKD (chronic kidney disease), stage III (HCC)   Hyperkalemia   Severe dehydration   Hypermagnesemia   Hyperglycemia   Insulin pump in place   Acute renal failure superimposed on stage 3 chronic kidney disease ([place])   1. Acute renal failure on stage III CKD-likely exacerbated by diuretics, dehydration and possibly her antineoplastic agent rucaparib which has been discontinued by her oncologist.  Her creatinine is up to 5 from a baseline of 1.3.  ACE inhibitor has been discontinued.  Nephrology consulted.  Started on albumin infusion.  CT abdomen does not show any obstructive process in the renal system.  Continue to follow urine output.  Overall renal function is better today. 2. Hyperkalemia -treated with lokelma.  Follow-up potassiums are better.  3. Hypermagnesmia - Pt tells me that she is taking oral magnesium in large doses. Hold  supplemental magnesium.  Follow levels.  4. 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  She has been on Lasix to maintain fluid balance however Lasix is currently being held due to acute kidney injury.  The patient's chemotherapy agent has been held by her oncologist.  Follow-up with oncology after discharge.  Will request paracentesis in a.m. 5. Type 2 diabetes mellitus with neurological complications-patient is maintained well on an insulin pump.  She is alert oriented and feels comfortable managing her insulin pump in the hospital. Patient was advised that if she is not feeling well to manage her pump then she should let us know immediately and we will discontinue the pump and treated with basal and prandial cutaneous insulin.  Patient verbalized understanding.  She feels that she can manage her pump fairly well in the hospital. 6. Cancer related pain-patient has been prescribed oxycodone IR 5 mg every 4 hours as needed.  She normally takes 5 hydrocodone 10 tablets/day according to records. 7. Peripheral neuropathy- likely secondary to diabetes and chemotherapy treatments-she has taken gabapentin in the past but is not currently taking it any longer.  Would not be able to use it in the setting of acute renal failure. 8. Possible UTI.  Urine culture positive for E. coli.  Continue on ceftriaxone.. 9. Anemia.  No evidence of bleeding.  Appears to be a chronic issue with baseline hemoglobin between 8-9.  Currently hemoglobin is  7.3.  Possibly related to chemotherapy.  Iron studies showed normal iron level and elevated ferritin.  Since MCV is elevated, will check B12 and folate.  Patient wishes to hold off on PRBC transfusion today and recheck CBC in a.m.   DVT prophylaxis: Lovenox Code Status: Full code Family Communication: Discussed with husband at the  bedside Disposition Plan: Discharge home once improved   Consultants:   Nephrology  Procedures:     Antimicrobials:   Ceftriaxone 12/7 >   Subjective: She reports having some shortness of breath on exertion.  She does feel tired.  No dizziness or lightheadedness.  No diarrhea today.  Objective: Vitals:   10/09/18 1511 10/09/18 2120 10/10/18 0620 10/10/18 1523  BP: (!) 127/44 (!) 141/49 (!) 141/45 (!) 106/40  Pulse: 79 82 80 71  Resp:   16 16  Temp: (!) 97.4 F (36.3 C) 98.9 F (37.2 C) 98.3 F (36.8 C) 98.2 F (36.8 C)  TempSrc: Oral Oral Oral Oral  SpO2: 96% 98% 97% 93%  Weight:      Height:        Intake/Output Summary (Last 24 hours) at 10/10/2018 1741 Last data filed at 10/10/2018 1236 Gross per 24 hour  Intake 1110 ml  Output 100 ml  Net 1010 ml   Filed Weights   10/08/18 1600  Weight: 102.5 kg    Examination:  General exam: Alert, awake, oriented x 3 Respiratory system: Clear to auscultation. Respiratory effort normal. Cardiovascular system:RRR. No murmurs, rubs, gallops. Gastrointestinal system: Abdomen is distended, soft and nontender. No organomegaly or masses felt. Normal bowel sounds heard. Central nervous system: Alert and oriented. No focal neurological deficits. Extremities: 1+ edema bilaterally Skin: No rashes, lesions or ulcers Psychiatry: Judgement and insight appear normal. Mood & affect appropriate.   Data Reviewed: I have personally reviewed following labs and imaging studies  CBC: Recent Labs  Lab 10/08/18 0910 10/09/18 0623 10/10/18 0631  WBC 7.2 6.2 5.7  NEUTROABS 5.6  --   --   HGB 8.1* 7.6* 7.3*  HCT 27.3* 25.7* 24.1*  MCV 103.4* 104.0* 102.1*  PLT 173 161 710*   Basic Metabolic Panel: Recent Labs  Lab 10/08/18 0910 10/09/18 0623 10/10/18 0631  NA 133* 134* 134*  133*  K 5.2* 5.1 4.7  4.7  CL 98 101 103  102  CO2 24 22 22  22   GLUCOSE 148* 108* 109*  111*  BUN 62* 64* 51*  50*  CREATININE 4.87* 5.02*  3.92*  3.88*  CALCIUM 8.1* 7.9* 8.4*  8.3*  MG 3.7* 3.5* 3.2*  PHOS  --  5.5* 4.8*   GFR: Estimated Creatinine Clearance: 16.5 mL/min (A) (by C-G formula based on SCr of 3.88 mg/dL (H)). Liver Function Tests: Recent Labs  Lab 10/08/18 0910 10/09/18 0623 10/10/18 0631  AST 27  --  27  ALT 18  --  19  ALKPHOS 82  --  70  BILITOT 0.6  --  0.6  PROT 5.8*  --  5.9*  ALBUMIN 3.1* 3.0* 3.5  3.4*   No results for input(s): LIPASE, AMYLASE in the last 168 hours. No results for input(s): AMMONIA in the last 168 hours. Coagulation Profile: No results for input(s): INR, PROTIME in the last 168 hours. Cardiac Enzymes: No results for input(s): CKTOTAL, CKMB, CKMBINDEX, TROPONINI in the last 168 hours. BNP (last 3 results) No results for input(s): PROBNP in the last 8760 hours. HbA1C: No results for input(s): HGBA1C in the last 72 hours. CBG: Recent  Labs  Lab 10/10/18 0125 10/10/18 0334 10/10/18 0731 10/10/18 1137 10/10/18 1614  GLUCAP 87 115* 112* 153* 157*   Lipid Profile: No results for input(s): CHOL, HDL, LDLCALC, TRIG, CHOLHDL, LDLDIRECT in the last 72 hours. Thyroid Function Tests: No results for input(s): TSH, T4TOTAL, FREET4, T3FREE, THYROIDAB in the last 72 hours. Anemia Panel: Recent Labs    10/10/18 0631  FERRITIN 704*  TIBC 187*  IRON 60   Sepsis Labs: No results for input(s): PROCALCITON, LATICACIDVEN in the last 168 hours.  Recent Results (from the past 240 hour(s))  Culture, Urine     Status: Abnormal (Preliminary result)   Collection Time: 10/08/18 10:50 PM  Result Value Ref Range Status   Specimen Description   Final    URINE, CLEAN CATCH Performed at [facility], [address] [place], [place] [address]    Special Requests   Final    NONE Performed at [facility], [address], [place], [place] [address]    Culture >=100,000 COLONIES/mL ESCHERICHIA COLI (A)  Final   Report Status PENDING  Incomplete         Radiology Studies: Ct  Renal Stone Study  Result Date: 10/09/2018 CLINICAL DATA:  High-grade peritoneal carcinomatosis. Abdominal pain. Possible obstruction. EXAM: CT ABDOMEN AND PELVIS WITHOUT CONTRAST TECHNIQUE: Multidetector CT imaging of the abdomen and pelvis was performed following the standard protocol without IV contrast. COMPARISON:  Multiple prior CT scans from 2019. The most recent is 09/13/2018 FINDINGS: Lower chest: No worrisome pulmonary nodules or pleural effusion. The heart is normal in size. No pericardial effusion. Hepatobiliary: No focal hepatic lesions or intrahepatic biliary dilatation. Gallbladder is distended but no obvious findings for acute cholecystitis. No common bile duct dilatation. Pancreas: No mass, inflammation or ductal dilatation. Spleen: Numerous small calcified granulomas but no focal lesions. Adrenals/Urinary Tract: Stable left adrenal gland nodule. The right adrenal gland is normal. No significant renal abnormalities other than a lower pole left renal calculus. No hydronephrosis. The bladder is grossly normal. Stomach/Bowel: The stomach, duodenum, small bowel and colon are grossly normal. No acute inflammatory changes or obstructive findings. There is a stable 4.2 x 3.2 cm mass associated with the transverse colon which is likely a peritoneal implant. Vascular/Lymphatic: Stable vascular calcifications.  No aneurysm. No mesenteric or retroperitoneal adenopathy. Stable appearing omental caking and peritoneal tumor. Nodular enhancing tissue in the gastrohepatic ligament is stable measuring a maximum of 26 x 22 mm on image number 28. Reproductive: Surgically absent. Other: Persistent large volume abdominal/pelvic ascites. Musculoskeletal: No significant bony findings. Interbody fusion changes at L3-4 with advanced degenerative changes at L2-3 and L4-5. IMPRESSION: 1. Overall stable CT appearance of the abdomen/pelvis since prior study 3 weeks ago. Stable omental and peritoneal surface disease and large  volume abdominal/pelvic ascites. 2. Stable large peritoneal implant involving the transverse colon. 3. No findings for bowel obstruction. 4. Moderate gallbladder distension with possible sludge but no definite findings for acute cholecystitis. 5. Lower pole left renal calculus. Electronically Signed   By: Marijo Sanes M.D.   On: 10/09/2018 18:02        Scheduled Meds: . enoxaparin (LOVENOX) injection  30 mg Subcutaneous Q24H  . insulin pump   Subcutaneous TID AC, HS, 0200  . nystatin   Topical TID   Continuous Infusions: . albumin human 100 g (10/10/18 0940)  . cefTRIAXone (ROCEPHIN)  IV 1 g (10/09/18 2303)     LOS: 2 days    Time spent: 66mins    [employer],  MD [facility] Pager [phone]  If 7PM-7AM, please contact night-coverage [email or web address] Password [facility] 10/10/2018, 5:41 PM

## 2018-10-11 ENCOUNTER — Inpatient Hospital Stay (HOSPITAL_COMMUNITY): Payer: Medicare Other

## 2018-10-11 ENCOUNTER — Encounter (HOSPITAL_COMMUNITY): Payer: Self-pay

## 2018-10-11 DIAGNOSIS — R188 Other ascites: Secondary | ICD-10-CM

## 2018-10-11 LAB — CBC
HCT: 23.5 % — ABNORMAL LOW (ref 36.0–46.0)
Hemoglobin: 6.9 g/dL — CL (ref 12.0–15.0)
MCH: 30.4 pg (ref 26.0–34.0)
MCHC: 29.4 g/dL — ABNORMAL LOW (ref 30.0–36.0)
MCV: 103.5 fL — ABNORMAL HIGH (ref 80.0–100.0)
NRBC: 0 % (ref 0.0–0.2)
Platelets: 125 10*3/uL — ABNORMAL LOW (ref 150–400)
RBC: 2.27 MIL/uL — ABNORMAL LOW (ref 3.87–5.11)
RDW: 16.7 % — ABNORMAL HIGH (ref 11.5–15.5)
WBC: 4.7 10*3/uL (ref 4.0–10.5)

## 2018-10-11 LAB — COMPREHENSIVE METABOLIC PANEL
ALT: 15 U/L (ref 0–44)
AST: 20 U/L (ref 15–41)
Albumin: 3.7 g/dL (ref 3.5–5.0)
Alkaline Phosphatase: 67 U/L (ref 38–126)
Anion gap: 8 (ref 5–15)
BUN: 35 mg/dL — ABNORMAL HIGH (ref 8–23)
CO2: 24 mmol/L (ref 22–32)
Calcium: 8.6 mg/dL — ABNORMAL LOW (ref 8.9–10.3)
Chloride: 105 mmol/L (ref 98–111)
Creatinine, Ser: 2.63 mg/dL — ABNORMAL HIGH (ref 0.44–1.00)
GFR calc Af Amer: 21 mL/min — ABNORMAL LOW (ref >60)
GFR calc non Af Amer: 18 mL/min — ABNORMAL LOW (ref >60)
GLUCOSE: 140 mg/dL — AB (ref 70–99)
Potassium: 4.6 mmol/L (ref 3.5–5.1)
Sodium: 137 mmol/L (ref 135–145)
Total Bilirubin: 0.6 mg/dL (ref 0.3–1.2)
Total Protein: 6.1 g/dL — ABNORMAL LOW (ref 6.5–8.1)

## 2018-10-11 LAB — URINE CULTURE: Culture: >=100000 — AB

## 2018-10-11 LAB — GLUCOSE, CAPILLARY
GLUCOSE-CAPILLARY: 117 mg/dL — AB (ref 70–99)
GLUCOSE-CAPILLARY: 84 mg/dL (ref 70–99)
Glucose-Capillary: 123 mg/dL — ABNORMAL HIGH (ref 70–99)
Glucose-Capillary: 126 mg/dL — ABNORMAL HIGH (ref 70–99)
Glucose-Capillary: 152 mg/dL — ABNORMAL HIGH (ref 70–99)
Glucose-Capillary: 167 mg/dL — ABNORMAL HIGH (ref 70–99)

## 2018-10-11 LAB — VITAMIN B12: Vitamin B-12: 850 pg/mL (ref 180–914)

## 2018-10-11 LAB — RENAL FUNCTION PANEL
Albumin: 3.8 g/dL (ref 3.5–5.0)
Anion gap: 9 (ref 5–15)
BUN: 36 mg/dL — ABNORMAL HIGH (ref 8–23)
CO2: 23 mmol/L (ref 22–32)
Calcium: 8.5 mg/dL — ABNORMAL LOW (ref 8.9–10.3)
Chloride: 104 mmol/L (ref 98–111)
Creatinine, Ser: 2.62 mg/dL — ABNORMAL HIGH (ref 0.44–1.00)
GFR calc Af Amer: 21 mL/min — ABNORMAL LOW (ref >60)
GFR calc non Af Amer: 18 mL/min — ABNORMAL LOW (ref >60)
Glucose, Bld: 140 mg/dL — ABNORMAL HIGH (ref 70–99)
Phosphorus: 4.1 mg/dL (ref 2.5–4.6)
Potassium: 4.5 mmol/L (ref 3.5–5.1)
Sodium: 136 mmol/L (ref 135–145)

## 2018-10-11 LAB — PREPARE RBC (CROSSMATCH)

## 2018-10-11 LAB — UREA NITROGEN, URINE: Urea Nitrogen, Ur: 723 mg/dL

## 2018-10-11 LAB — MAGNESIUM: MAGNESIUM: 2.8 mg/dL — AB (ref 1.7–2.4)

## 2018-10-11 MED ORDER — SODIUM CHLORIDE 0.9% IV SOLUTION
Freq: Once | INTRAVENOUS | Status: AC
  Administered 2018-10-11: 16:00:00 via INTRAVENOUS

## 2018-10-11 MED ORDER — ALBUMIN HUMAN 25 % IV SOLN: 12.5000 g | Freq: Once | INTRAVENOUS | Status: DC

## 2018-10-11 MED ORDER — FUROSEMIDE 40 MG PO TABS
40.0000 mg | ORAL_TABLET | Freq: Every day | ORAL | Status: DC
  Administered 2018-10-11: 40 mg via ORAL
  Filled 2018-10-11: qty 1

## 2018-10-11 MED ORDER — ALBUMIN HUMAN 25 % IV SOLN: 50.0000 g | Freq: Once | INTRAVENOUS | Status: AC

## 2018-10-11 MED ORDER — DIPHENHYDRAMINE HCL 50 MG/ML IJ SOLN
25.0000 mg | Freq: Four times a day (QID) | INTRAMUSCULAR | Status: DC | PRN
  Administered 2018-10-11: 25 mg via INTRAVENOUS
  Filled 2018-10-11: qty 1

## 2018-10-11 NOTE — Progress Notes (Signed)
Paracentesis complete no signs of distress.  

## 2018-10-11 NOTE — Progress Notes (Signed)
Subjective:  No UOP recorded but fortunately renal function has improved over the last 48 hours- pt says has been making more urine Objective Vital signs in last 24 hours: Vitals:   10/10/18 1523 10/10/18 2132 10/11/18 0601 10/11/18 0653  BP: (!) 106/40 (!) 128/52 (!) 136/51 (!) 130/52  Pulse: 71 76 78 77  Resp: 16 16 16 20   Temp: 98.2 F (36.8 C) 98.4 F (36.9 C) 98.2 F (36.8 C) 98 F (36.7 C)  TempSrc: Oral Oral Oral Oral  SpO2: 93% 98% 97% 96%  Weight:      Height:       Weight change:   Intake/Output Summary (Last 24 hours) at 10/11/2018 6063 Last data filed at 10/10/2018 1700 Gross per 24 hour  Intake 720 ml  Output -  Net 720 ml    Assessment/ Plan: Pt is a 67 y.o. yo female who was admitted on 10/08/2018 with AKI in the setting of volume depletion/ACE and NSAIDS  Assessment/Plan: 1.  Acute kidney injury on chronic kidney disease stage III: Based on the history, this appeared to be hemodynamically mediated acute kidney injury in the setting of  volume depletion from diminished oral intake with ongoing ACE inhibitor/diuretic and NSAID use.    Rupacarib is also been reported to cause elevated creatinine based on product monograph.  CT scan of the abdomen and pelvis did not show hydro.  albumin at around 1 g/kilogram to try and intravascularly expand her and improve renal perfusion as well as holding diuretics/ACE inhibitor has led to improvement. Due for one more albumin IV dosing today then stop.  Would NOT restart ACE as OP for now, also told to stop NSAIDS 2.  Serous peritoneal carcinoma-high-grade: Unfortunately with extensive involvement of peritoneal cavity and associated ascites.  per onc 3.  Anemia: Secondary to chronic disease/malignancy- iron studies seem OK.  May benefit from ESA- will defer to onc. 4.  Hyperkalemia: resolved  5.  Diabetes mellitus: Glycemic management per primary service 6. Volume- overloaded- per pt to get paracentesis today ?  I also will resume  lasix at 40 daily- could be inc to BID upon d/c  7. Dispo- asking about going home- I have no objections as long as kidney function monitored as OP which I am sure it would be- if renal function returns to normal or near do not feel needs OP renal follow up as CKD is the least of her issues     Louis Meckel    Labs: Basic Metabolic Panel: Recent Labs  Lab 10/09/18 0623 10/10/18 0631 10/11/18 0556  NA 134* 134*  133* 137  136  K 5.1 4.7  4.7 4.6  4.5  CL 101 103  102 105  104  CO2 22 22  22 24  23   GLUCOSE 108* 109*  111* 140*  140*  BUN 64* 51*  50* 35*  36*  CREATININE 5.02* 3.92*  3.88* 2.63*  2.62*  CALCIUM 7.9* 8.4*  8.3* 8.6*  8.5*  PHOS 5.5* 4.8* 4.1   Liver Function Tests: Recent Labs  Lab 10/08/18 0910 10/09/18 0623 10/10/18 0631 10/11/18 0556  AST 27  --  27 20  ALT 18  --  19 15  ALKPHOS 82  --  70 67  BILITOT 0.6  --  0.6 0.6  PROT 5.8*  --  5.9* 6.1*  ALBUMIN 3.1* 3.0* 3.5  3.4* 3.7  3.8   No results for input(s): LIPASE, AMYLASE in the last 168 hours. No  results for input(s): AMMONIA in the last 168 hours. CBC: Recent Labs  Lab 10/08/18 0910 10/09/18 0623 10/10/18 0631 10/11/18 0556  WBC 7.2 6.2 5.7 4.7  NEUTROABS 5.6  --   --   --   HGB 8.1* 7.6* 7.3* 6.9*  HCT 27.3* 25.7* 24.1* 23.5*  MCV 103.4* 104.0* 102.1* 103.5*  PLT 173 161 147* 125*   Cardiac Enzymes: No results for input(s): CKTOTAL, CKMB, CKMBINDEX, TROPONINI in the last 168 hours. CBG: Recent Labs  Lab 10/10/18 1614 10/10/18 2136 10/11/18 0213 10/11/18 0409 10/11/18 0738  GLUCAP 157* 147* 123* 117* 126*    Iron Studies:  Recent Labs    10/10/18 0631  IRON 60  TIBC 187*  FERRITIN 704*   Studies/Results: Ct Renal Stone Study  Result Date: 10/09/2018 CLINICAL DATA:  High-grade peritoneal carcinomatosis. Abdominal pain. Possible obstruction. EXAM: CT ABDOMEN AND PELVIS WITHOUT CONTRAST TECHNIQUE: Multidetector CT imaging of the abdomen and  pelvis was performed following the standard protocol without IV contrast. COMPARISON:  Multiple prior CT scans from 2019. The most recent is 09/13/2018 FINDINGS: Lower chest: No worrisome pulmonary nodules or pleural effusion. The heart is normal in size. No pericardial effusion. Hepatobiliary: No focal hepatic lesions or intrahepatic biliary dilatation. Gallbladder is distended but no obvious findings for acute cholecystitis. No common bile duct dilatation. Pancreas: No mass, inflammation or ductal dilatation. Spleen: Numerous small calcified granulomas but no focal lesions. Adrenals/Urinary Tract: Stable left adrenal gland nodule. The right adrenal gland is normal. No significant renal abnormalities other than a lower pole left renal calculus. No hydronephrosis. The bladder is grossly normal. Stomach/Bowel: The stomach, duodenum, small bowel and colon are grossly normal. No acute inflammatory changes or obstructive findings. There is a stable 4.2 x 3.2 cm mass associated with the transverse colon which is likely a peritoneal implant. Vascular/Lymphatic: Stable vascular calcifications.  No aneurysm. No mesenteric or retroperitoneal adenopathy. Stable appearing omental caking and peritoneal tumor. Nodular enhancing tissue in the gastrohepatic ligament is stable measuring a maximum of 26 x 22 mm on image number 28. Reproductive: Surgically absent. Other: Persistent large volume abdominal/pelvic ascites. Musculoskeletal: No significant bony findings. Interbody fusion changes at L3-4 with advanced degenerative changes at L2-3 and L4-5. IMPRESSION: 1. Overall stable CT appearance of the abdomen/pelvis since prior study 3 weeks ago. Stable omental and peritoneal surface disease and large volume abdominal/pelvic ascites. 2. Stable large peritoneal implant involving the transverse colon. 3. No findings for bowel obstruction. 4. Moderate gallbladder distension with possible sludge but no definite findings for acute  cholecystitis. 5. Lower pole left renal calculus. Electronically Signed   By: Marijo Sanes M.D.   On: 10/09/2018 18:02   Medications: Infusions: . albumin human 100 g (10/10/18 0940)    Scheduled Medications: . enoxaparin (LOVENOX) injection  30 mg Subcutaneous Q24H  . insulin pump   Subcutaneous TID AC, HS, 0200  . nystatin   Topical TID    have reviewed scheduled and prn medications.  Physical Exam: General: alert, NAD Heart: RRR Lungs: dec BS at bases Abdomen: distended, non tender Extremities: pitting edema    10/11/2018,8:33 AM  LOS: 3 days

## 2018-10-11 NOTE — Procedures (Signed)
PreOperative Dx: Cirrhosis, ascites °Postoperative Dx: Cirrhosis, ascites °Procedure:   US guided paracentesis °Radiologist:  Masin Shatto °Anesthesia:  10 ml of1% lidocaine °Specimen:  4 L of yellow ascitic fluid °EBL:   < 1 ml °Complications: None   °

## 2018-10-11 NOTE — Discharge Summary (Signed)
Physician Discharge Summary  Ellen Hunt WTU:[id number] DOB: 1951/10/06 DOA: 10/08/2018  PCP: Eustaquio Maize, MD  Admit date: 10/08/2018 Discharge date: 10/11/2018  Admitted From: Home Disposition: Home  Recommendations for Outpatient Follow-up:  1. Follow up with PCP in 1-2 weeks 2. Please obtain BMP/CBC in one week 3. Follow-up with oncology on Friday  Discharge Condition: Stable CODE STATUS: Full code Diet recommendation: Heart healthy, carb modified  Brief/Interim Summary: 67 year old female with primary peritoneal carcinoma, was directly admitted to the hospital from the cancer center for abnormal labs.  Patient had been feeling unwell.  She is being treated at the cancer center for high-grade serous carcinoma of the peritoneum.  She had been on chemotherapy and had felt increasingly weak and nauseous.  On admission, she was found to have an elevated creatinine of 4.87.  Her baseline creatinine is 1.3.  Potassium was also mildly elevated at 5.2.  Patient was started on IV fluids and referred for admission.  Discharge Diagnoses:  Active Problems:   Type 2 diabetes mellitus with complication, with long term current use of insulin pump (HCC)   Hypertension   Severe obesity (BMI >= 40) (HCC)   Glaucoma   Anemia due to antineoplastic chemotherapy   Peripheral neuropathy due to chemotherapy (HCC)   CKD (chronic kidney disease), stage III (HCC)   Hyperkalemia   Severe dehydration   Hypermagnesemia   Hyperglycemia   Insulin pump in place   Acute renal failure superimposed on stage 3 chronic kidney disease ([place])  1. Acute renal failure on chronic kidney disease stage III.  Likely exacerbated by diuretics and ACE inhibitor in the setting of dehydration.  Her antineoplastic agent could also have been playing a role.  Her creatinine trended up to 5 from a baseline of 1.3.  ACE inhibitor was held.  She was started on IV fluids.  Nephrology was consulted.  She had a CT of the abdomen  that did not show any obstructive process in her renal system.  She also received intravenous albumin infusions.  Overall her urine output has improved.  We will continue to hold her ACE inhibitor for now.  We will also continue to hold her chemotherapeutic agent.  Per nephrology, she will be restarted on Lasix since she does have some pitting edema.  She will have a repeat basic metabolic panel drawn on Friday when she follows up with her oncologist. 2. Hyperkalemia.  Treated with Lokelma. 3. Hypermagnesemia.  Patient was taking magnesium supplements as an outpatient.  This was held on admission.  Magnesium level, although improved is still elevated.  This can be rechecked on Friday with her oncologist.  If level is trending down, can restart supplementation at a lower dose. 4. 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  Patient had paracentesis during her hospital stay with removal of 4 L of fluid.  She is continued on Lasix on discharge.  Overall ascites appears to be better. 5. Diabetes.  She is managed by an insulin pump.  She will continue this on discharge. 6. Chronic pain related to malignancy.  She is continued on oxycodone. 7. Urinary tract infection.  Urine culture positive for E. coli.  She was treated with 3 days of ceftriaxone. 8. Anemia.  She did not have any evidence of bleeding.  Suspect is related to her underlying malignancy.  Hemoglobin did trend down to 6.9.  She was transfused 1 unit of PRBC.  She will follow-up for repeat CBC when she sees her oncologist on  Friday.  Discharge Instructions  Discharge Instructions    Diet - low sodium heart healthy   Complete by:  As directed    Increase activity slowly   Complete by:  As directed      Allergies as of 10/11/2018      Reactions   Avandia [rosiglitazone] Other (See Comments)   Legs swelled   Micronase [glyburide] Swelling   Actos [pioglitazone] Other (See Comments)   Edema / leg swelling       Medication List    STOP taking these medications   benazepril 10 MG tablet Commonly known as:  LOTENSIN   magnesium oxide 400 (241.3 Mg) MG tablet Commonly known as:  MAG-OX   metFORMIN 1000 MG tablet Commonly known as:  GLUCOPHAGE   rucaparib camsylate 300 MG tablet Commonly known as:  RUBRACA     TAKE these medications   atorvastatin 40 MG tablet Commonly known as:  LIPITOR Take 1 tablet (40 mg total) by mouth daily.   Biotin 1000 MCG tablet Take 1,000 mcg by mouth 3 (three) times daily.   furosemide 40 MG tablet Commonly known as:  LASIX Take 1 tablet (40 mg total) by mouth 2 (two) times daily.   HYDROcodone-acetaminophen 10-325 MG tablet Commonly known as:  NORCO Take 1 tablet by mouth every 4 (four) hours as needed.   insulin lispro 100 UNIT/ML injection Commonly known as:  HUMALOG USE IN INSULIN PUMP AS DIRECTED. MAX DAILY DOSE OF 110 UNITS PER DAY. What changed:    how much to take  how to take this  when to take this   lidocaine-prilocaine cream Commonly known as:  EMLA Apply a quarter size amount to port site 1 hour prior to chemo. Do not rub in. Cover with plastic wrap.   multivitamin with minerals Tabs tablet Take 1 tablet by mouth daily.   omeprazole 40 MG capsule Commonly known as:  PRILOSEC Take 1 capsule (40 mg total) by mouth daily.   polyethylene glycol packet Commonly known as:  MIRALAX / GLYCOLAX Take 17 g by mouth daily as needed for mild constipation.   prochlorperazine 10 MG tablet Commonly known as:  COMPAZINE Take 1 tablet (10 mg total) by mouth every 6 (six) hours as needed for nausea or vomiting.   vitamin B-12 1000 MCG tablet Commonly known as:  CYANOCOBALAMIN Take 1,000 mcg by mouth daily.   vitamin E 400 UNIT capsule Generic drug:  vitamin E Take 400 Units by mouth daily.       Allergies  Allergen Reactions  . Avandia [Rosiglitazone] Other (See Comments)    Legs swelled  . Micronase [Glyburide] Swelling  .  Actos [Pioglitazone] Other (See Comments)    Edema / leg swelling    Consultations:  Nephrology   Procedures/Studies: Ct Chest W Contrast  Result Date: 09/13/2018 CLINICAL DATA:  Restaging ovarian cancer. EXAM: CT CHEST, ABDOMEN, AND PELVIS WITH CONTRAST TECHNIQUE: Multidetector CT imaging of the chest, abdomen and pelvis was performed following the standard protocol during bolus administration of intravenous contrast. CONTRAST:  18mL ISOVUE-300 IOPAMIDOL (ISOVUE-300) INJECTION 61% COMPARISON:  Numerous prior recent studies. The most recent is 06/30/2018 FINDINGS: CT CHEST FINDINGS Cardiovascular: The heart is normal in size. No pericardial effusion. The aorta is normal in caliber. No dissection. No significant atherosclerotic calcifications. The branch vessels are patent. Calcifications noted at the ostia. Scattered coronary artery calcifications are stable. Right-sided Port-A-Cath in good position without complicating features. The pulmonary arteries appear normal. Mediastinum/Nodes: No mediastinal or hilar  mass or lymphadenopathy. Small scattered lymph nodes are noted along with small scattered calcified lymph nodes. The esophagus is grossly normal. Lungs/Pleura: No acute pulmonary findings. No worrisome pulmonary nodules. No pleural effusion. Musculoskeletal: No significant bony findings. CT ABDOMEN PELVIS FINDINGS Hepatobiliary: No focal hepatic lesions or intrahepatic biliary dilatation. The gallbladder is grossly normal. No common bile duct dilatation. Pancreas: No mass, inflammation or ductal dilatation. Spleen: Normal size.  No focal lesions. Adrenals/Urinary Tract: The adrenal glands and kidneys are stable. Stable left adrenal gland nodule, likely benign adenoma. Stomach/Bowel: The stomach, duodenum, small bowel and colon are grossly normal. No acute inflammatory changes or obstructive findings. There is an enlarging soft tissue mass associated with transverse colon. This measures 4.5 x 3.3  cm. It measured 3.4 x 2.3 on the prior CT scan. Although it could represent a colon cancer I think it is most likely a peritoneal metastasis. Vascular/Lymphatic: A fairly extensive nodular enhancing soft tissue in the omentectomy bed. This appears progressive when compared to the prior examinations. Enhancing lesion in the lesser sac measures 25 x 20 mm and previously measured 20 x 14 mm. Rounded nodular lesion anteriorly on image number 66 of series 11 measures 17.5 mm and measured less than 10 mm on the prior study. The ill-defined elongated area of enhancing soft tissue in the omentectomy bed on image number 66 measures approximately 6.7 x 2.7 cm and previously measured 3.7 x 2.0 cm. Advanced atherosclerotic calcifications involving the aorta and iliac arteries. The branch vessels are patent. The major venous structures are patent. Reproductive: Surgically absent. Other: Large volume abdominal/pelvic ascites likely malignant. Musculoskeletal: No significant bony findings. IMPRESSION: 1. Progressive ill-defined enhancing soft tissue nodules in the omentectomy bed consistent with progressive metastatic ovarian cancer. 2. Large ill-defined lesion involving the transverse colon has increased in size since the prior CT scan. It could be colon cancer but is more likely a peritoneal metastasis. 3. Large volume abdominal/pelvic ascites, progressive since prior study. Electronically Signed   By: Marijo Sanes M.D.   On: 09/13/2018 15:24   Ct Abdomen Pelvis W Contrast  Result Date: 09/13/2018 CLINICAL DATA:  Restaging ovarian cancer. EXAM: CT CHEST, ABDOMEN, AND PELVIS WITH CONTRAST TECHNIQUE: Multidetector CT imaging of the chest, abdomen and pelvis was performed following the standard protocol during bolus administration of intravenous contrast. CONTRAST:  164mL ISOVUE-300 IOPAMIDOL (ISOVUE-300) INJECTION 61% COMPARISON:  Numerous prior recent studies. The most recent is 06/30/2018 FINDINGS: CT CHEST FINDINGS  Cardiovascular: The heart is normal in size. No pericardial effusion. The aorta is normal in caliber. No dissection. No significant atherosclerotic calcifications. The branch vessels are patent. Calcifications noted at the ostia. Scattered coronary artery calcifications are stable. Right-sided Port-A-Cath in good position without complicating features. The pulmonary arteries appear normal. Mediastinum/Nodes: No mediastinal or hilar mass or lymphadenopathy. Small scattered lymph nodes are noted along with small scattered calcified lymph nodes. The esophagus is grossly normal. Lungs/Pleura: No acute pulmonary findings. No worrisome pulmonary nodules. No pleural effusion. Musculoskeletal: No significant bony findings. CT ABDOMEN PELVIS FINDINGS Hepatobiliary: No focal hepatic lesions or intrahepatic biliary dilatation. The gallbladder is grossly normal. No common bile duct dilatation. Pancreas: No mass, inflammation or ductal dilatation. Spleen: Normal size.  No focal lesions. Adrenals/Urinary Tract: The adrenal glands and kidneys are stable. Stable left adrenal gland nodule, likely benign adenoma. Stomach/Bowel: The stomach, duodenum, small bowel and colon are grossly normal. No acute inflammatory changes or obstructive findings. There is an enlarging soft tissue mass associated with transverse colon. This  measures 4.5 x 3.3 cm. It measured 3.4 x 2.3 on the prior CT scan. Although it could represent a colon cancer I think it is most likely a peritoneal metastasis. Vascular/Lymphatic: A fairly extensive nodular enhancing soft tissue in the omentectomy bed. This appears progressive when compared to the prior examinations. Enhancing lesion in the lesser sac measures 25 x 20 mm and previously measured 20 x 14 mm. Rounded nodular lesion anteriorly on image number 66 of series 11 measures 17.5 mm and measured less than 10 mm on the prior study. The ill-defined elongated area of enhancing soft tissue in the omentectomy bed  on image number 66 measures approximately 6.7 x 2.7 cm and previously measured 3.7 x 2.0 cm. Advanced atherosclerotic calcifications involving the aorta and iliac arteries. The branch vessels are patent. The major venous structures are patent. Reproductive: Surgically absent. Other: Large volume abdominal/pelvic ascites likely malignant. Musculoskeletal: No significant bony findings. IMPRESSION: 1. Progressive ill-defined enhancing soft tissue nodules in the omentectomy bed consistent with progressive metastatic ovarian cancer. 2. Large ill-defined lesion involving the transverse colon has increased in size since the prior CT scan. It could be colon cancer but is more likely a peritoneal metastasis. 3. Large volume abdominal/pelvic ascites, progressive since prior study. Electronically Signed   By: Marijo Sanes M.D.   On: 09/13/2018 15:24   US Paracentesis  Result Date: 10/11/2018 INDICATION: Ovarian cancer, malignant ascites EXAM: ULTRASOUND GUIDED THERAPEUTIC PARACENTESIS MEDICATIONS: None. COMPLICATIONS: None immediate. PROCEDURE: Procedure, benefits, and risks of procedure were discussed with patient. Written informed consent for procedure was obtained. Time out protocol followed. Adequate collection of ascites localized by ultrasound in RIGHT lower quadrant. Skin prepped and draped in usual sterile fashion. Skin and soft tissues anesthetized with 10 mL of 1% lidocaine. 5 [place] Yueh catheter placed into peritoneal cavity. 4.0 L of yellow ascitic fluid aspirated by vacuum bottle suction. Procedure tolerated well by patient without immediate complication. FINDINGS: As above IMPRESSION: Successful ultrasound-guided paracentesis yielding 4 liters of peritoneal fluid. Electronically Signed   By: Lavonia Dana M.D.   On: 10/11/2018 11:47   US Paracentesis  Result Date: 09/21/2018 INDICATION: Peritoneal carcinomatosis, ovarian cancer EXAM: ULTRASOUND GUIDED THERAPEUTIC PARACENTESIS MEDICATIONS: None.  COMPLICATIONS: None immediate. PROCEDURE: Procedure, benefits, and risks of procedure were discussed with patient. Written informed consent for procedure was obtained. Time out protocol followed. Adequate collection of ascites localized by ultrasound in RIGHT lower quadrant. Skin prepped and draped in usual sterile fashion. Skin and soft tissues anesthetized with 10 mL of 1% lidocaine. 5 [place] Yueh catheter placed into peritoneal cavity. 7 L of yellow ascitic fluid aspirated by vacuum bottle suction. Procedure tolerated well by patient without immediate complication. FINDINGS: As above IMPRESSION: Successful ultrasound-guided paracentesis yielding 7 liters of peritoneal fluid. Electronically Signed   By: Lavonia Dana M.D.   On: 09/21/2018 11:40   Ct Renal Stone Study  Result Date: 10/09/2018 CLINICAL DATA:  High-grade peritoneal carcinomatosis. Abdominal pain. Possible obstruction. EXAM: CT ABDOMEN AND PELVIS WITHOUT CONTRAST TECHNIQUE: Multidetector CT imaging of the abdomen and pelvis was performed following the standard protocol without IV contrast. COMPARISON:  Multiple prior CT scans from 2019. The most recent is 09/13/2018 FINDINGS: Lower chest: No worrisome pulmonary nodules or pleural effusion. The heart is normal in size. No pericardial effusion. Hepatobiliary: No focal hepatic lesions or intrahepatic biliary dilatation. Gallbladder is distended but no obvious findings for acute cholecystitis. No common bile duct dilatation. Pancreas: No mass, inflammation or ductal dilatation. Spleen: Numerous small calcified  granulomas but no focal lesions. Adrenals/Urinary Tract: Stable left adrenal gland nodule. The right adrenal gland is normal. No significant renal abnormalities other than a lower pole left renal calculus. No hydronephrosis. The bladder is grossly normal. Stomach/Bowel: The stomach, duodenum, small bowel and colon are grossly normal. No acute inflammatory changes or obstructive findings. There is  a stable 4.2 x 3.2 cm mass associated with the transverse colon which is likely a peritoneal implant. Vascular/Lymphatic: Stable vascular calcifications.  No aneurysm. No mesenteric or retroperitoneal adenopathy. Stable appearing omental caking and peritoneal tumor. Nodular enhancing tissue in the gastrohepatic ligament is stable measuring a maximum of 26 x 22 mm on image number 28. Reproductive: Surgically absent. Other: Persistent large volume abdominal/pelvic ascites. Musculoskeletal: No significant bony findings. Interbody fusion changes at L3-4 with advanced degenerative changes at L2-3 and L4-5. IMPRESSION: 1. Overall stable CT appearance of the abdomen/pelvis since prior study 3 weeks ago. Stable omental and peritoneal surface disease and large volume abdominal/pelvic ascites. 2. Stable large peritoneal implant involving the transverse colon. 3. No findings for bowel obstruction. 4. Moderate gallbladder distension with possible sludge but no definite findings for acute cholecystitis. 5. Lower pole left renal calculus. Electronically Signed   By: Marijo Sanes M.D.   On: 10/09/2018 18:02      Subjective: Feeling better today. No shortness of breath, making more urine  Discharge Exam: Vitals:   10/11/18 0942 10/11/18 1006 10/11/18 1345 10/11/18 1613  BP: (!) 136/56 (!) 132/48 (!) 129/48 (!) 123/49  Pulse: 78 78 75 74  Resp: 18 18 18    Temp:   98.7 F (37.1 C)   TempSrc:      SpO2: 94% 96% 97% 98%  Weight:      Height:        General: Pt is alert, awake, not in acute distress Cardiovascular: RRR, S1/S2 +, no rubs, no gallops Respiratory: CTA bilaterally, no wheezing, no rhonchi Abdominal: Soft, NT, ND, bowel sounds + Extremities: no edema, no cyanosis    The results of significant diagnostics from this hospitalization (including imaging, microbiology, ancillary and laboratory) are listed below for reference.     Microbiology: Recent Results (from the past 240 hour(s))  Culture,  Urine     Status: Abnormal   Collection Time: 10/08/18 10:50 PM  Result Value Ref Range Status   Specimen Description   Final    URINE, CLEAN CATCH Performed at [facility], [address] [place], [place] [address]    Special Requests   Final    NONE Performed at [facility], [address], [place], [place] [address]    Culture >=100,000 COLONIES/mL ESCHERICHIA COLI (A)  Final   Report Status 10/11/2018 FINAL  Final   Organism ID, Bacteria ESCHERICHIA COLI (A)  Final      Susceptibility   Escherichia coli - MIC*    AMPICILLIN >=32 RESISTANT Resistant     CEFAZOLIN 16 SENSITIVE Sensitive     CEFTRIAXONE <=1 SENSITIVE Sensitive     CIPROFLOXACIN >=4 RESISTANT Resistant     GENTAMICIN <=1 SENSITIVE Sensitive     IMIPENEM <=0.25 SENSITIVE Sensitive     NITROFURANTOIN <=16 SENSITIVE Sensitive     TRIMETH/SULFA <=20 SENSITIVE Sensitive     AMPICILLIN/SULBACTAM >=32 RESISTANT Resistant     PIP/TAZO <=4 SENSITIVE Sensitive     Extended ESBL NEGATIVE Sensitive     * >=100,000 COLONIES/mL ESCHERICHIA COLI     Labs: BNP (last 3 results) No results for input(s): BNP in the last  8760 hours. Basic Metabolic Panel: Recent Labs  Lab 10/08/18 0910 10/09/18 0623 10/10/18 0631 10/11/18 0556  NA 133* 134* 134*  133* 137  136  K 5.2* 5.1 4.7  4.7 4.6  4.5  CL 98 101 103  102 105  104  CO2 24 22 22  22 24  23   GLUCOSE 148* 108* 109*  111* 140*  140*  BUN 62* 64* 51*  50* 35*  36*  CREATININE 4.87* 5.02* 3.92*  3.88* 2.63*  2.62*  CALCIUM 8.1* 7.9* 8.4*  8.3* 8.6*  8.5*  MG 3.7* 3.5* 3.2* 2.8*  PHOS  --  5.5* 4.8* 4.1   Liver Function Tests: Recent Labs  Lab 10/08/18 0910 10/09/18 0623 10/10/18 0631 10/11/18 0556  AST 27  --  27 20  ALT 18  --  19 15  ALKPHOS 82  --  70 67  BILITOT 0.6  --  0.6 0.6  PROT 5.8*  --  5.9* 6.1*  ALBUMIN 3.1* 3.0* 3.5  3.4* 3.7  3.8   No results for input(s): LIPASE, AMYLASE in the last 168 hours. No results for input(s):  AMMONIA in the last 168 hours. CBC: Recent Labs  Lab 10/08/18 0910 10/09/18 0623 10/10/18 0631 10/11/18 0556  WBC 7.2 6.2 5.7 4.7  NEUTROABS 5.6  --   --   --   HGB 8.1* 7.6* 7.3* 6.9*  HCT 27.3* 25.7* 24.1* 23.5*  MCV 103.4* 104.0* 102.1* 103.5*  PLT 173 161 147* 125*   Cardiac Enzymes: No results for input(s): CKTOTAL, CKMB, CKMBINDEX, TROPONINI in the last 168 hours. BNP: Invalid input(s): POCBNP CBG: Recent Labs  Lab 10/11/18 0213 10/11/18 0409 10/11/18 0738 10/11/18 1117 10/11/18 1618  GLUCAP 123* 117* 126* 152* 167*   D-Dimer No results for input(s): DDIMER in the last 72 hours. Hgb A1c No results for input(s): HGBA1C in the last 72 hours. Lipid Profile No results for input(s): CHOL, HDL, LDLCALC, TRIG, CHOLHDL, LDLDIRECT in the last 72 hours. Thyroid function studies No results for input(s): TSH, T4TOTAL, T3FREE, THYROIDAB in the last 72 hours.  Invalid input(s): FREET3 Anemia work up Recent Labs    10/10/18 0631 10/11/18 0556  VITAMINB12  --  850  FERRITIN 704*  --   TIBC 187*  --   IRON 60  --    Urinalysis    Component Value Date/Time   COLORURINE YELLOW 10/08/2018 2250   APPEARANCEUR CLOUDY (A) 10/08/2018 2250   LABSPEC 1.016 10/08/2018 2250   PHURINE 5.0 10/08/2018 2250   GLUCOSEU NEGATIVE 10/08/2018 2250   HGBUR NEGATIVE 10/08/2018 2250   BILIRUBINUR NEGATIVE 10/08/2018 2250   KETONESUR NEGATIVE 10/08/2018 2250   PROTEINUR NEGATIVE 10/08/2018 2250   NITRITE NEGATIVE 10/08/2018 2250   LEUKOCYTESUR MODERATE (A) 10/08/2018 2250   Sepsis Labs Invalid input(s): PROCALCITONIN,  WBC,  LACTICIDVEN Microbiology Recent Results (from the past 240 hour(s))  Culture, Urine     Status: Abnormal   Collection Time: 10/08/18 10:50 PM  Result Value Ref Range Status   Specimen Description   Final    URINE, CLEAN CATCH Performed at [facility], [address] [place], [place] [address]    Special Requests   Final    NONE Performed at [facility], [address] [place], [place] [address]    Culture >=100,000 COLONIES/mL ESCHERICHIA COLI (A)  Final   Report Status 10/11/2018 FINAL  Final   Organism ID, Bacteria ESCHERICHIA COLI (A)  Final      Susceptibility  Escherichia coli - MIC*    AMPICILLIN >=32 RESISTANT Resistant     CEFAZOLIN 16 SENSITIVE Sensitive     CEFTRIAXONE <=1 SENSITIVE Sensitive     CIPROFLOXACIN >=4 RESISTANT Resistant     GENTAMICIN <=1 SENSITIVE Sensitive     IMIPENEM <=0.25 SENSITIVE Sensitive     NITROFURANTOIN <=16 SENSITIVE Sensitive     TRIMETH/SULFA <=20 SENSITIVE Sensitive     AMPICILLIN/SULBACTAM >=32 RESISTANT Resistant     PIP/TAZO <=4 SENSITIVE Sensitive     Extended ESBL NEGATIVE Sensitive     * >=100,000 COLONIES/mL ESCHERICHIA COLI     Time coordinating discharge: 1mins  SIGNED:   Kathie Dike, MD  [facility] 10/11/2018, 8:11 PM Pager   If 7PM-7AM, please contact night-coverage [email or web address] Password [email or web address]

## 2018-10-11 NOTE — Care Management Important Message (Signed)
Important Message  Patient Details  Name: Ellen Hunt MRN: [id number] Date of Birth: January 18, 1951   Medicare Important Message Given:  Yes    Shelda Altes 10/11/2018, 11:34 AM

## 2018-10-11 NOTE — Progress Notes (Signed)
Unit of blood completed without any difficulty. Patient port a cath flushed and deaccessed. Discharge instructions given to patient and verbalized understanding. Patient escorted to main entrance of hospital via [facility] released to care of husband.

## 2018-10-11 NOTE — Progress Notes (Signed)
Pt's hands noted to be shaking after ambulating to bathroom and she states she felt as if her BS was low. Checked BS with reading of 117. Pt requested orange juice to slowly sip on as she was afraid it would drop.

## 2018-10-12 LAB — BPAM RBC
Blood Product Expiration Date: 201912312359
Blood Product Expiration Date: 202001022359
ISSUE DATE / TIME: 201912092031
ISSUE DATE / TIME: 201912100322
Unit Type and Rh: 5100
Unit Type and Rh: 5100

## 2018-10-12 LAB — TYPE AND SCREEN
ABO/RH(D): O POS
Antibody Screen: NEGATIVE
Unit division: 0
Unit division: 0

## 2018-10-12 LAB — FOLATE RBC
Folate, Hemolysate: 591.6 ng/mL
Folate, RBC: 2817 ng/mL (ref >498)
Hematocrit: 21 % — ABNORMAL LOW (ref 34.0–46.6)

## 2018-10-14 ENCOUNTER — Other Ambulatory Visit (HOSPITAL_COMMUNITY): Payer: Self-pay | Admitting: *Deleted

## 2018-10-14 DIAGNOSIS — C569 Malignant neoplasm of unspecified ovary: Secondary | ICD-10-CM

## 2018-10-15 ENCOUNTER — Encounter (HOSPITAL_COMMUNITY): Payer: Self-pay | Admitting: Hematology

## 2018-10-15 ENCOUNTER — Other Ambulatory Visit: Payer: Self-pay

## 2018-10-15 ENCOUNTER — Inpatient Hospital Stay (HOSPITAL_COMMUNITY): Payer: Medicare Other | Admitting: Hematology

## 2018-10-15 ENCOUNTER — Inpatient Hospital Stay (HOSPITAL_COMMUNITY): Payer: Medicare Other

## 2018-10-15 VITALS — BP 125/52 | HR 95 | Temp 98.3°F | Resp 18 | Wt 210.4 lb

## 2018-10-15 DIAGNOSIS — G8929 Other chronic pain: Secondary | ICD-10-CM

## 2018-10-15 DIAGNOSIS — E1122 Type 2 diabetes mellitus with diabetic chronic kidney disease: Secondary | ICD-10-CM | POA: Diagnosis not present

## 2018-10-15 DIAGNOSIS — Z90722 Acquired absence of ovaries, bilateral: Secondary | ICD-10-CM

## 2018-10-15 DIAGNOSIS — Z90710 Acquired absence of both cervix and uterus: Secondary | ICD-10-CM

## 2018-10-15 DIAGNOSIS — C569 Malignant neoplasm of unspecified ovary: Secondary | ICD-10-CM

## 2018-10-15 DIAGNOSIS — G893 Neoplasm related pain (acute) (chronic): Secondary | ICD-10-CM

## 2018-10-15 DIAGNOSIS — C481 Malignant neoplasm of specified parts of peritoneum: Secondary | ICD-10-CM

## 2018-10-15 DIAGNOSIS — R234 Changes in skin texture: Secondary | ICD-10-CM | POA: Diagnosis not present

## 2018-10-15 DIAGNOSIS — Z79899 Other long term (current) drug therapy: Secondary | ICD-10-CM | POA: Diagnosis not present

## 2018-10-15 DIAGNOSIS — N189 Chronic kidney disease, unspecified: Secondary | ICD-10-CM | POA: Diagnosis not present

## 2018-10-15 DIAGNOSIS — Z9221 Personal history of antineoplastic chemotherapy: Secondary | ICD-10-CM

## 2018-10-15 DIAGNOSIS — M545 Low back pain: Secondary | ICD-10-CM | POA: Diagnosis not present

## 2018-10-15 DIAGNOSIS — T451X5A Adverse effect of antineoplastic and immunosuppressive drugs, initial encounter: Secondary | ICD-10-CM

## 2018-10-15 DIAGNOSIS — G629 Polyneuropathy, unspecified: Secondary | ICD-10-CM

## 2018-10-15 DIAGNOSIS — Z794 Long term (current) use of insulin: Secondary | ICD-10-CM | POA: Diagnosis not present

## 2018-10-15 DIAGNOSIS — C55 Malignant neoplasm of uterus, part unspecified: Secondary | ICD-10-CM

## 2018-10-15 DIAGNOSIS — G620 Drug-induced polyneuropathy: Secondary | ICD-10-CM

## 2018-10-15 DIAGNOSIS — C786 Secondary malignant neoplasm of retroperitoneum and peritoneum: Secondary | ICD-10-CM

## 2018-10-15 DIAGNOSIS — M25551 Pain in right hip: Secondary | ICD-10-CM

## 2018-10-15 DIAGNOSIS — R180 Malignant ascites: Secondary | ICD-10-CM | POA: Diagnosis not present

## 2018-10-15 DIAGNOSIS — E875 Hyperkalemia: Secondary | ICD-10-CM | POA: Diagnosis not present

## 2018-10-15 LAB — COMPREHENSIVE METABOLIC PANEL
ALT: 31 U/L (ref 0–44)
AST: 27 U/L (ref 15–41)
Albumin: 4.1 g/dL (ref 3.5–5.0)
Alkaline Phosphatase: 87 U/L (ref 38–126)
Anion gap: 8 (ref 5–15)
BUN: 21 mg/dL (ref 8–23)
CO2: 25 mmol/L (ref 22–32)
Calcium: 9.3 mg/dL (ref 8.9–10.3)
Chloride: 104 mmol/L (ref 98–111)
Creatinine, Ser: 1.24 mg/dL — ABNORMAL HIGH (ref 0.44–1.00)
GFR calc Af Amer: 53 mL/min — ABNORMAL LOW (ref >60)
GFR calc non Af Amer: 46 mL/min — ABNORMAL LOW (ref >60)
GLUCOSE: 87 mg/dL (ref 70–99)
Potassium: 3.9 mmol/L (ref 3.5–5.1)
Sodium: 137 mmol/L (ref 135–145)
Total Bilirubin: 0.5 mg/dL (ref 0.3–1.2)
Total Protein: 6.9 g/dL (ref 6.5–8.1)

## 2018-10-15 LAB — CBC WITH DIFFERENTIAL/PLATELET
Abs Immature Granulocytes: 0.02 10*3/uL (ref 0.00–0.07)
Basophils Absolute: 0 10*3/uL (ref 0.0–0.1)
Basophils Relative: 1 %
Eosinophils Absolute: 0.2 10*3/uL (ref 0.0–0.5)
Eosinophils Relative: 5 %
HEMATOCRIT: 32.1 % — AB (ref 36.0–46.0)
Hemoglobin: 9.7 g/dL — ABNORMAL LOW (ref 12.0–15.0)
Immature Granulocytes: 0 %
Lymphocytes Relative: 9 %
Lymphs Abs: 0.4 10*3/uL — ABNORMAL LOW (ref 0.7–4.0)
MCH: 29.8 pg (ref 26.0–34.0)
MCHC: 30.2 g/dL (ref 30.0–36.0)
MCV: 98.8 fL (ref 80.0–100.0)
MONOS PCT: 8 %
Monocytes Absolute: 0.4 10*3/uL (ref 0.1–1.0)
Neutro Abs: 3.8 10*3/uL (ref 1.7–7.7)
Neutrophils Relative %: 77 %
Platelets: 104 10*3/uL — ABNORMAL LOW (ref 150–400)
RBC: 3.25 MIL/uL — ABNORMAL LOW (ref 3.87–5.11)
RDW: 17.7 % — AB (ref 11.5–15.5)
WBC: 5 10*3/uL (ref 4.0–10.5)
nRBC: 0 % (ref 0.0–0.2)

## 2018-10-15 LAB — MAGNESIUM: Magnesium: 1.5 mg/dL — ABNORMAL LOW (ref 1.7–2.4)

## 2018-10-15 LAB — LACTATE DEHYDROGENASE: LDH: 144 U/L (ref 98–192)

## 2018-10-15 NOTE — Addendum Note (Signed)
Addended by: Glennie Isle on: 10/15/2018 04:18 PM   Modules accepted: Orders

## 2018-10-15 NOTE — Patient Instructions (Signed)
[place] at [facility] Discharge Instructions  Follow up with [place] in a week with labs    Thank you for choosing [place] at [facility] to provide your oncology and hematology care.  To afford each patient quality time with our provider, please arrive at least 15 minutes before your scheduled appointment time.   If you have a lab appointment with the [place] please come in thru the  Main Entrance and check in at the main information desk  You need to re-schedule your appointment should you arrive 10 or more minutes late.  We strive to give you quality time with our providers, and arriving late affects you and other patients whose appointments are after yours.  Also, if you no show three or more times for appointments you may be dismissed from the clinic at the providers discretion.     Again, thank you for choosing [facility].  Our hope is that these requests will decrease the amount of time that you wait before being seen by our physicians.       _____________________________________________________________  Should you have questions after your visit to [facility], please contact our office at [phone] between the hours of 8:00 a.m. and 4:30 p.m.  Voicemails left after 4:00 p.m. will not be returned until the following business day.  For prescription refill requests, have your pharmacy contact our office and allow 72 hours.    Cancer Center Support Programs:   > Cancer Support Group  2nd Tuesday of the month 1pm-2pm, Journey Room

## 2018-10-15 NOTE — Assessment & Plan Note (Signed)
1.  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 -Underwent 7 cycles of chemotherapy with carboplatin and paclitaxel from 06/01/2016 through 09/18/2016. -Germline mutation testing on 06/25/2016 was negative. - Underwent robotic assisted tumor debulking surgery on 10/14/2016 by Dr. Denman George. -3 cycles of chemotherapy with carboplatin and paclitaxel from 11/06/2016 through 12/25/2016. - Second line therapy with Doxil and bevacizumab from 05/25/2017 through 07/07/2017. - Third line therapy with gemcitabine from 07/30/2017 through December 2018, gemcitabine and cisplatin day 1 and day 8 q. 21 days from 11/02/2017, 7 cycles completed on 06/14/2018, with CT scan on 07/01/2018 showing progression. -She underwent paracentesis, with 4 L removed on 07/08/2018.  There is a subcutaneous area of skin thickening at the paracentesis site, questionable for seeding. - 3 cycles of docetaxel 75 mg/m from 07/16/2018 through 08/26/2018. - Ca1 25 levels on 07/01/2018 was 2005, 2367 on day of cycle 2, 2333 on day of cycle 3. -We discussed the results of the CT scan CAP which showed progression of her disease.  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  FDA approved therapeutic option rucaparib was suggested. - Rucaparib 600 mg twice daily started on 09/25/2018. -She had paracentesis on 09/21/2018, 7 L fluid removed. - 7 days after starting rucaparib, she started feeling very weak and nauseous.  Rucaparib was held on 10/05/2018 due to severe weakness.  -She was admitted to the hospital on 10/08/2018 through 10/11/2018 with weakness, and renal failure. -She received 1 unit of PRBC.  CT abdomen done on 10/09/2018 showed stable omental and peritoneal disease.  She underwent paracentesis on 10/11/2018 with 4 L of fluid removed. - Her  medications including benazepril and metformin were held due to elevated creatinine.  She received hydration and her creatinine improved to her baseline.  She was started back on Lasix as needed. - I have recommended her to start back on rucaparib 300 mg twice daily.  We will monitor her very closely.  I will see her back in 1 week after the start to repeat blood counts.  2.  Malignant ascites: -Paracentesis on 07/08/2018, 4 L removed. -Paracentesis on 09/21/2018, 7 L removed. -Paracentesis on 10/11/2018 with 4 L removed. -She was started back on Lasix as needed..  3.  CKD: -Her creatinine improved to 1.24 today.  Benazepril and metformin were discontinued.  4.  Diabetes type 2: -She is on insulin pump and her sugars are fairly well controlled.  5.  Cancer related pain: -She has abdominal and low back pain.  She is taking hydrocodone 10 mg 4 to 5 tablets/day.  6.  Peripheral neuropathy: -She has on and off numbness in the toes.  No numbness in the hands reported.  She was prescribed gabapentin few months ago but she is not taking it.

## 2018-10-15 NOTE — Progress Notes (Signed)
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 This may potentially be ovarian or a GI in etiology.    04/30/2016 Tumor Marker    CA 125- 7149.0 (H)    05/01/2016 Procedure    US paracentesis- Successful ultrasound-guided paracentesis yielding 1.8 liters of peritoneal fluid.    05/01/2016 Imaging    US pelvis- Both transabdominal and transvaginal sonography are significantly limited by large patient habitus and ascites. Neither uterus or ovaries were visualized on this exam.    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    05/08/2016 Imaging    CT chest- No evidence of metastatic disease in the chest. Peritoneal/omental disease with abdominal ascites in the upper abdomen, incompletely visualized.     05/13/2016 Procedure    Placement of single lumen port a cath via right internal jugular vein. The catheter tip lies at the cavoatrial junction. A power injectable port a cath was placed and is ready for immediate use.    05/15/2016 Procedure    US Paracentesis- 3400 ml yellow colored ascites removed    05/15/2016 - 09/18/2016 Chemotherapy    Carboplatin/Paclitaxel every 21 days x 7 cycles    07/01/2016 Miscellaneous   Genetic Counseling by [name]-  Genetic testing was normal, and did not reveal a deleterious mutation in these genes.     07/08/2016 Imaging    CT CAP- 1. Small volume ascites, significantly decreased. 2. Stable diffuse omental soft tissue caking and diffuse peritoneal thickening along the bilateral paracolic gutters and bilateral pelvic peritoneal reflections, consistent with peritoneal carcinomatosis. 3. Stable asymmetrically enlarged right ovary, which may represent the primary site of ovarian malignancy. 4. No evidence of metastatic disease in the chest. No new sites of metastatic disease in the abdomen or pelvis.    07/09/2016 Miscellaneous    Gyn Onc re-evaluation- modest response to therapy, 3 more cycles of chemotherapy recommended.      09/11/2016 Imaging    CT C/A/P No significant change omental soft tissue caking, consistent with metastatic disease. Mild ascites is decreased since previous study.  Increased calcification along peritoneal surface in pelvic cul-de-sac, consistent with treated peritoneal metastatic disease.  Stable 4.5cm homogeneous right pelvic mass, which favors a uterine fibroid although right ovarian neoplasm cannot definitely be excluded.  No new or progressive metastatic disease identified. No evidence of metastatic disease within the thorax.     10/14/2016 Procedure    Robotic-assisted laparoscopic total hysterectomy with bilateral salpingoophorectomy, ex lap omentectomy, radical tumor debulking by Dr. Denman George    10/17/2016 Pathology Results    Diagnosis 1. 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 - CERVIX AND ENDOMETRIUM FREE OF TUMOR. - SEE ONCOLOGY TABLE AND COMMENT. 2. Soft  tissue, biopsy, umbilical nodule - HIGH GRADE SEROUS CARCINOMA. 3. Omentum, resection for tumor - HIGH GRADE SEROUS CARCINOMA, 33 CM.    11/06/2016 - 12/25/2016  Chemotherapy    Carboplatin/Paclitaxel x 3 cycles     01/12/2017 Imaging    CT CAP- 1. Interval hysterectomy, bilateral salpingo-oophorectomy and omentectomy without evidence of tumor recurrence. 2. No evidence of metastatic disease. 3. 5 mm nonobstructing lower pole left renal calculus.    01/13/2017 Remission    No evidence of residual disease on CT imaging.    05/04/2017 Imaging    CT CAP- New small amount of ascites within the abdomen and pelvis since 01/12/2017 which could indicate disease progression but no new identifiable tumor and no significant change in omental and mild peritoneal thickening.  No evidence of metastatic disease within the chest.  Coronary artery disease.  Aortic Atherosclerosis (ICD10-I70.0).    05/04/2017 Tumor Marker    Patient's tumor was tested for the following markers: CA 125 Results of the tumor marker test revealed 7654    05/18/2017 Imaging    ECHO; EF 60% -  65    05/25/2017 - 07/07/2017 Chemotherapy    She received Doxil and Avastin. Treatment is stopped due to disease progression    06/01/2017 Procedure    Successful ultrasound-guided therapeutic paracentesis yielding 2.8 liters of peritoneal fluid    06/22/2017 Tumor Marker    Patient's tumor was tested for the following markers: CA 125 Results of the tumor marker test revealed 13440    07/20/2017 Imaging    Mild increase in peritoneal carcinoma within abdomen pelvis since previous study. No significant change and minimal ascites.  No evidence of metastatic disease within the thorax. New mild airspace opacity in left lower lobe, consistent with inflammatory or infectious etiology.    07/30/2017 Tumor Marker    Patient's tumor was tested for the following markers: CA 125 Results of the tumor marker test revealed 12099    07/30/2017 - 06/14/2018 Chemotherapy    The patient had gemzar. Cisplatin is added on 11/02/18    08/03/2017 - 08/05/2017 Hospital Admission    She was admitted to the  hospital for management of UTI and neutropenic fever    08/13/2017 Adverse Reaction    Dose of chemotherapy is reduced due to neutropenic sepsis    08/24/2017 Tumor Marker    Patient's tumor was tested for the following markers: CA 125 Results of the tumor marker test revealed 7431    09/21/2017 Tumor Marker    Patient's tumor was tested for the following markers: CA 125 Results of the tumor marker test revealed 4176    10/09/2017 Imaging    No significant change in peritoneal carcinomatosis since previous study.  No new or progressive metastatic disease within the abdomen or pelvis.  Stable tiny nonobstructive left renal calculus. No evidence of ureteral calculi or hydronephrosis.    11/10/2017 Tumor Marker    Patient's tumor was tested for the following markers: CA 125 Results of the tumor marker test revealed 4257    11/24/2017 Tumor Marker    Patient's tumor was tested for the following markers: CA 125 Results of the tumor marker test revealed 3377    01/12/2018 Tumor Marker    Patient's tumor was tested for the following markers: CA 125 Results of the tumor marker test revealed 1595    01/21/2018 Imaging    1. Stable to slightly improved omental, gastrohepatic ligament and pelvic disease as detailed above. No new/progressive findings. 2. No acute  abdominal or pelvic findings. 3. Stable lower pole left renal calculus.    02/08/2018 Tumor Marker    Patient's tumor was tested for the following markers: CA 125 Results of the tumor marker test revealed 1058    03/08/2018 Tumor Marker    Patient's tumor was tested for the following markers: CA-125 Results of the tumor marker test revealed 737.1    04/02/2018 Tumor Marker    Patient's tumor was tested for the following markers: CA-125 Results of the tumor marker test revealed 542.8    06/14/2018 Tumor Marker    CA 125- 1010    06/30/2018 Imaging    1. Mixed appearance but overall progression, with the left upper quadrant omental  tumor slightly improved but with some potential new deposition of tumor along the hepatic flexure margin. There is also new moderate ascites which must be considered suspicious for malignant ascites/peritoneal spread. 2. Other imaging findings of potential clinical significance: Nonobstructive left nephrolithiasis. Aortic Atherosclerosis (ICD10-I70.0). Mild subcutaneous edema along the sixth anterior abdominal wall potentially from mild panniculitis.    07/08/2018 Procedure    Successful ultrasound-guided paracentesis yielding 4 liters of peritoneal fluid.    07/15/2018 -  Chemotherapy    The patient had pegfilgrastim (NEULASTA ONPRO KIT) injection with Taxotere    07/16/2018 Tumor Marker    CA 125- 1172      CANCER STAGING: Cancer Staging 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HCC) Staging form: Ovary, AJCC 7th Edition - Pathologic stage from 10/17/2016: FIGO Stage IIIC, calculated as Stage III (T3, N0, cM0) - Signed by Kefalas, Thomas S, PA-C on 10/30/2016    INTERVAL HISTORY:  Ellen Hunt 67 y.o. female returns for routine follow-up for high-grade serous carcinoma of the peritoneum. She is here today with her husband and she is feeling a lot better since we saw her last time. She has gotten her strengh back a little and she is eating and drinking better. She does report nausea and diarrhea occasionally. She denies any vomiting. Denies any headaches. Denies any fevers or recent infections. Denies any new pains. Denies any new cough or hemoptysis. She reports her appetite at 75% and her energy level is 50%. She is in need of a walker for home and public use. She is unable and too weak to walk without one.     REVIEW OF SYSTEMS:  Review of Systems  Gastrointestinal: Positive for constipation, diarrhea and nausea.  All other systems reviewed and are negative.    PAST MEDICAL/SURGICAL HISTORY:  Past Medical History:  Diagnosis Date  . Diabetes mellitus without complication (HCC)     on insulin pump  . Dysrhythmia   . 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HCC) 05/09/2016  . Family history of breast cancer   . GERD (gastroesophageal reflux disease)   . History of blood transfusion   . History of bronchitis   . History of chemotherapy   . History of urinary tract infection   . Hyperlipidemia   . Hypertension   . Low serum vitamin D   . Ovarian cancer (HCC) 05/09/2016  . Shingles    Past Surgical History:  Procedure Laterality Date  . ABDOMINAL HYSTERECTOMY  10/14/2016  . CESAREAN SECTION    . DEBULKING N/A 10/14/2016   Procedure: DEBULKING;  Surgeon: Emma Rossi, MD;  Location: [facility];  Service: Gynecology;  Laterality: N/A;  . IR PARACENTESIS  06/01/2017  . LAPAROTOMY WITH STAGING N/A 10/14/2016   Procedure: LAPAROTOMY WITH OMENTECTOMY AND TUMOR DEBULGING;  Surgeon: Emma Rossi, MD;    Location: [facility];  Service: Gynecology;  Laterality: N/A;  . LUMBAR FUSION  08/21/15   L3-L4 Dr. Roy Morehead  . OMENTECTOMY N/A 10/14/2016   Procedure: OMENTECTOMY;  Surgeon: Emma Rossi, MD;  Location: [facility];  Service: Gynecology;  Laterality: N/A;  . ROBOTIC ASSISTED TOTAL HYSTERECTOMY WITH BILATERAL SALPINGO OOPHERECTOMY Bilateral 10/14/2016   Procedure: XI ROBOTIC ASSISTED TOTAL LAPARSCOPIC  HYSTERECTOMY WITH BILATERAL SALPINGO OOPHORECTOMY;  Surgeon: Emma Rossi, MD;  Location: [facility];  Service: Gynecology;  Laterality: Bilateral;     SOCIAL HISTORY:  Social History   Socioeconomic History  . Marital status: Married    Spouse name: [name]  . Number of children: 2  . Years of education: Not on file  . Highest education level: Not on file  Occupational History  . Occupation: retired  Social Needs  . Financial resource strain: Not on file  . Food insecurity:    Worry: Not on file    Inability: Not on file  . Transportation needs:    Medical: Not on file    Non-medical: Not on file  Tobacco Use  . Smoking status: Never Smoker  . Smokeless tobacco: Never Used    Substance and Sexual Activity  . Alcohol use: No  . Drug use: No  . Sexual activity: Not on file    Comment: married  Lifestyle  . Physical activity:    Days per week: Not on file    Minutes per session: Not on file  . Stress: Not on file  Relationships  . Social connections:    Talks on phone: Not on file    Gets together: Not on file    Attends religious service: Not on file    Active member of club or organization: Not on file    Attends meetings of clubs or organizations: Not on file    Relationship status: Not on file  . Intimate partner violence:    Fear of current or ex partner: Not on file    Emotionally abused: Not on file    Physically abused: Not on file    Forced sexual activity: Not on file  Other Topics Concern  . Not on file  Social History Narrative  . Not on file    FAMILY HISTORY:  Family History  Problem Relation Age of Onset  . Lung cancer Mother        smoker; dx in her 70s  . Leukemia Father   . Diabetes Paternal Grandmother   . Heart attack Paternal Grandmother   . Diabetes Paternal Grandfather   . Breast cancer Paternal Aunt        dx in her 20s-30s  . Leukemia Paternal Uncle   . Heart attack Maternal Grandfather   . Breast cancer Cousin        maternal first cousin    CURRENT MEDICATIONS:  Outpatient Encounter Medications as of 10/15/2018  Medication Sig Note  . atorvastatin (LIPITOR) 40 MG tablet Take 1 tablet (40 mg total) by mouth daily.   . Biotin 1000 MCG tablet Take 1,000 mcg by mouth 3 (three) times daily.   . furosemide (LASIX) 40 MG tablet Take 1 tablet (40 mg total) by mouth 2 (two) times daily.   . HYDROcodone-acetaminophen (NORCO) 10-325 MG tablet Take 1 tablet by mouth every 4 (four) hours as needed.   . insulin lispro (HUMALOG) 100 UNIT/ML injection USE IN INSULIN PUMP AS DIRECTED. MAX DAILY DOSE OF 110 UNITS PER DAY. (Patient taking differently: Inject 110 Units   into the skin See admin instructions. USE IN INSULIN PUMP AS  DIRECTED. MAX DAILY DOSE OF 110 UNITS PER DAY.)   . lidocaine-prilocaine (EMLA) cream Apply a quarter size amount to port site 1 hour prior to chemo. Do not rub in. Cover with plastic wrap.   . Multiple Vitamin (MULTIVITAMIN WITH MINERALS) TABS Take 1 tablet by mouth daily.   Marland Kitchen omeprazole (PRILOSEC) 40 MG capsule Take 1 capsule (40 mg total) by mouth daily.   . polyethylene glycol (MIRALAX / GLYCOLAX) packet Take 17 g by mouth daily as needed for mild constipation.    . prochlorperazine (COMPAZINE) 10 MG tablet Take 1 tablet (10 mg total) by mouth every 6 (six) hours as needed for nausea or vomiting.   . vitamin B-12 (CYANOCOBALAMIN) 1000 MCG tablet Take 1,000 mcg by mouth daily.   . vitamin E (VITAMIN E) 400 UNIT capsule Take 400 Units by mouth daily.   . [DISCONTINUED] benazepril (LOTENSIN) 10 MG tablet Take 0.5 tablets (5 mg total) by mouth daily.   . [DISCONTINUED] glucose blood (ONE TOUCH ULTRA TEST) test strip USE TO CHECK BLOOD SUGAR UP TO 5 TIMES A DAY (Patient not taking: Reported on 10/08/2018)   . [DISCONTINUED] loperamide (IMODIUM A-D) 2 MG tablet Take 1 tablet (2 mg total) by mouth 4 (four) times daily as needed for diarrhea or loose stools. (Patient not taking: Reported on 10/08/2018) Jul 26, 202019: As needed  . [DISCONTINUED] loratadine (CLARITIN) 10 MG tablet Take 10 mg by mouth daily.   . [DISCONTINUED] magnesium oxide (MAG-OX) 400 (241.3 Mg) MG tablet Take 1 tablet (400 mg total) by mouth 3 (three) times daily.   . [DISCONTINUED] metFORMIN (GLUCOPHAGE) 1000 MG tablet TAKE 1 TABLET BY MOUTH TWICE DAILY WITH A MEAL (Patient taking differently: Take 1,000 mg by mouth 2 (two) times daily with a meal. TAKE 1 TABLET BY MOUTH TWICE DAILY WITH A MEAL)   . [DISCONTINUED] multivitamin-lutein (OCUVITE-LUTEIN) CAPS capsule Take 1 capsule by mouth daily.   . [DISCONTINUED] ondansetron (ZOFRAN) 8 MG tablet Take 1 tablet (8 mg total) by mouth every 8 (eight) hours as needed for nausea or vomiting.   .  [DISCONTINUED] rucaparib camsylate (RUBRACA) 300 MG tablet Take 2 tablets (600 mg total) by mouth 2 (two) times daily.   . [DISCONTINUED] senna (SENOKOT) 8.6 MG TABS tablet Take 1 tablet (8.6 mg total) by mouth at bedtime as needed for moderate constipation. (Patient not taking: Reported on 10/08/2018)   . [DISCONTINUED] 0.9 %  sodium chloride infusion    . [DISCONTINUED] sodium chloride flush (NS) 0.9 % injection 10 mL     No facility-administered encounter medications on file as of 10/15/2018.     ALLERGIES:  Allergies  Allergen Reactions  . Avandia [Rosiglitazone] Other (See Comments)    Legs swelled  . Micronase [Glyburide] Swelling  . Actos [Pioglitazone] Other (See Comments)    Edema / leg swelling     PHYSICAL EXAM:  ECOG Performance status: 1  Vitals:   10/15/18 1040  BP: (!) 125/52  Pulse: 95  Resp: 18  Temp: 98.3 F (36.8 C)  SpO2: 98%   Filed Weights   10/15/18 1040  Weight: 210 lb 6.4 oz (95.4 kg)    Physical Exam Constitutional:      Appearance: Normal appearance. She is normal weight.  Skin:    General: Skin is warm and dry.  Neurological:     Mental Status: She is alert and oriented to person, place, and time. Mental status is  at baseline.  Psychiatric:        Mood and Affect: Mood normal.        Behavior: Behavior normal.        Thought Content: Thought content normal.        Judgment: Judgment normal.      LABORATORY DATA:  I have reviewed the labs as listed.  CBC    Component Value Date/Time   WBC 5.0 10/15/2018 0940   RBC 3.25 (L) 10/15/2018 0940   HGB 9.7 (L) 10/15/2018 0940   HGB 9.6 (L) 11/02/2017 0756   HCT 32.1 (L) 10/15/2018 0940   HCT 21.0 (L) 10/11/2018 0556   HCT 30.6 (L) 11/02/2017 0756   PLT 104 (L) 10/15/2018 0940   PLT 150 11/02/2017 0756   PLT 40 (LL) 11/18/2016 1642   MCV 98.8 10/15/2018 0940   MCV 108.5 (H) 11/02/2017 0756   MCH 29.8 10/15/2018 0940   MCHC 30.2 10/15/2018 0940   RDW 17.7 (H) 10/15/2018 0940    RDW 17.8 (H) 11/02/2017 0756   LYMPHSABS 0.4 (L) 10/15/2018 0940   LYMPHSABS 0.4 (L) 11/02/2017 0756   MONOABS 0.4 10/15/2018 0940   MONOABS 0.5 11/02/2017 0756   EOSABS 0.2 10/15/2018 0940   EOSABS 0.7 (H) 11/02/2017 0756   EOSABS 0.1 11/18/2016 1642   BASOSABS 0.0 10/15/2018 0940   BASOSABS 0.0 11/02/2017 0756   CMP Latest Ref Rng & Units 10/15/2018 10/11/2018 10/11/2018  Glucose 70 - 99 mg/dL 87 140(H) 140(H)  BUN 8 - 23 mg/dL 21 35(H) 36(H)  Creatinine 0.44 - 1.00 mg/dL 1.24(H) 2.63(H) 2.62(H)  Sodium 135 - 145 mmol/L 137 137 136  Potassium 3.5 - 5.1 mmol/L 3.9 4.6 4.5  Chloride 98 - 111 mmol/L 104 105 104  CO2 22 - 32 mmol/L 25 24 23  Calcium 8.9 - 10.3 mg/dL 9.3 8.6(L) 8.5(L)  Total Protein 6.5 - 8.1 g/dL 6.9 6.1(L) -  Total Bilirubin 0.3 - 1.2 mg/dL 0.5 0.6 -  Alkaline Phos 38 - 126 U/L 87 67 -  AST 15 - 41 U/L 27 20 -  ALT 0 - 44 U/L 31 15 -       DIAGNOSTIC IMAGING:  I have independently reviewed the scans and discussed with the patient.   I have reviewed  , NP's note and agree with the documentation.  I personally performed a face-to-face visit, made revisions and my assessment and plan is as follows.    ASSESSMENT & PLAN:   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HCC) 1.  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 -Underwent 7 cycles of chemotherapy with carboplatin and paclitaxel from 06/01/2016 through 09/18/2016. -Germline mutation testing on 06/25/2016 was negative. - Underwent robotic assisted tumor debulking surgery on 10/14/2016 by Dr. Rossi. -3 cycles of chemotherapy with carboplatin and paclitaxel from 11/06/2016 through 12/25/2016. - Second line therapy with Doxil and bevacizumab from 05/25/2017 through 07/07/2017. - Third line therapy with gemcitabine from 07/30/2017 through December 2018, gemcitabine and cisplatin day 1 and day 8 q. 21 days  from 11/02/2017, 7 cycles completed on 06/14/2018, with CT scan on 07/01/2018 showing progression. -She underwent paracentesis, with 4 L removed on 07/08/2018.  There is a subcutaneous area of skin thickening at the paracentesis site, questionable for seeding. - 3 cycles of docetaxel 75 mg/m from 07/16/2018 through 08/26/2018. - Ca1 25 levels on 07/01/2018 was 2005, 2367 on day of cycle 2, 2333 on day of   cycle 3. -We discussed the results of the CT scan CAP which showed progression of her disease.  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  FDA approved therapeutic option rucaparib was suggested. - Rucaparib 600 mg twice daily started on 09/25/2018. -She had paracentesis on 09/21/2018, 7 L fluid removed. - 7 days after starting rucaparib, she started feeling very weak and nauseous.  Rucaparib was held on 10/05/2018 due to severe weakness.  -She was admitted to the hospital on 10/08/2018 through 10/11/2018 with weakness, and renal failure. -She received 1 unit of PRBC.  CT abdomen done on 10/09/2018 showed stable omental and peritoneal disease.  She underwent paracentesis on 10/11/2018 with 4 L of fluid removed. - Her medications including benazepril and metformin were held due to elevated creatinine.  She received hydration and her creatinine improved to her baseline.  She was started back on Lasix as needed. - I have recommended her to start back on rucaparib 300 mg twice daily.  We will monitor her very closely.  I will see her back in 1 week after the start to repeat blood counts.  2.  Malignant ascites: -Paracentesis on 07/08/2018, 4 L removed. -Paracentesis on 09/21/2018, 7 L removed. -Paracentesis on 10/11/2018 with 4 L removed. -She was started back on Lasix as needed..  3.  CKD: -Her creatinine improved to 1.24 today.  Benazepril and metformin were discontinued.  4.  Diabetes type 2: -She  is on insulin pump and her sugars are fairly well controlled.  5.  Cancer related pain: -She has abdominal and low back pain.  She is taking hydrocodone 10 mg 4 to 5 tablets/day.  6.  Peripheral neuropathy: -She has on and off numbness in the toes.  No numbness in the hands reported.  She was prescribed gabapentin few months ago but she is not taking it.      Orders placed this encounter:  Orders Placed This Encounter  Procedures  . Lactate dehydrogenase  . CBC with Differential/Platelet  . Comprehensive metabolic panel      Sreedhar Katragadda, MD [facility] [phone]  

## 2018-10-18 ENCOUNTER — Other Ambulatory Visit (HOSPITAL_COMMUNITY): Payer: Self-pay | Admitting: Hematology

## 2018-10-18 DIAGNOSIS — C569 Malignant neoplasm of unspecified ovary: Secondary | ICD-10-CM

## 2018-10-19 ENCOUNTER — Encounter (HOSPITAL_COMMUNITY): Payer: Self-pay

## 2018-10-19 NOTE — Telephone Encounter (Signed)
Randi can you fix this?

## 2018-10-20 ENCOUNTER — Other Ambulatory Visit (HOSPITAL_COMMUNITY): Payer: Self-pay | Admitting: *Deleted

## 2018-10-20 ENCOUNTER — Telehealth (HOSPITAL_COMMUNITY): Payer: Self-pay | Admitting: *Deleted

## 2018-10-20 DIAGNOSIS — R531 Weakness: Secondary | ICD-10-CM

## 2018-10-20 DIAGNOSIS — G8929 Other chronic pain: Secondary | ICD-10-CM

## 2018-10-20 DIAGNOSIS — R5383 Other fatigue: Secondary | ICD-10-CM

## 2018-10-20 DIAGNOSIS — C569 Malignant neoplasm of unspecified ovary: Secondary | ICD-10-CM

## 2018-10-20 DIAGNOSIS — M25551 Pain in right hip: Principal | ICD-10-CM

## 2018-10-20 NOTE — Telephone Encounter (Signed)
Rolling walker with seat ordered for patient and sent to [employer]. Fax # [phone]. I called to confirm that they did receive the fax and they did. They are calling patient to let her know that they received it as well as me.

## 2018-10-22 ENCOUNTER — Other Ambulatory Visit (HOSPITAL_COMMUNITY): Payer: Self-pay | Admitting: *Deleted

## 2018-10-22 ENCOUNTER — Inpatient Hospital Stay (HOSPITAL_COMMUNITY): Payer: Medicare Other | Admitting: Hematology

## 2018-10-22 ENCOUNTER — Encounter (HOSPITAL_COMMUNITY): Payer: Self-pay | Admitting: Hematology

## 2018-10-22 ENCOUNTER — Other Ambulatory Visit (HOSPITAL_COMMUNITY): Payer: Self-pay | Admitting: Nurse Practitioner

## 2018-10-22 ENCOUNTER — Inpatient Hospital Stay (HOSPITAL_COMMUNITY): Payer: Medicare Other

## 2018-10-22 DIAGNOSIS — R180 Malignant ascites: Secondary | ICD-10-CM | POA: Diagnosis not present

## 2018-10-22 DIAGNOSIS — C481 Malignant neoplasm of specified parts of peritoneum: Secondary | ICD-10-CM

## 2018-10-22 DIAGNOSIS — C786 Secondary malignant neoplasm of retroperitoneum and peritoneum: Secondary | ICD-10-CM

## 2018-10-22 DIAGNOSIS — N189 Chronic kidney disease, unspecified: Secondary | ICD-10-CM | POA: Diagnosis not present

## 2018-10-22 DIAGNOSIS — Z90722 Acquired absence of ovaries, bilateral: Secondary | ICD-10-CM

## 2018-10-22 DIAGNOSIS — Z79899 Other long term (current) drug therapy: Secondary | ICD-10-CM

## 2018-10-22 DIAGNOSIS — Z90710 Acquired absence of both cervix and uterus: Secondary | ICD-10-CM

## 2018-10-22 DIAGNOSIS — C569 Malignant neoplasm of unspecified ovary: Secondary | ICD-10-CM

## 2018-10-22 DIAGNOSIS — T451X5A Adverse effect of antineoplastic and immunosuppressive drugs, initial encounter: Principal | ICD-10-CM

## 2018-10-22 DIAGNOSIS — M545 Low back pain: Secondary | ICD-10-CM

## 2018-10-22 DIAGNOSIS — D6481 Anemia due to antineoplastic chemotherapy: Secondary | ICD-10-CM

## 2018-10-22 DIAGNOSIS — E1122 Type 2 diabetes mellitus with diabetic chronic kidney disease: Secondary | ICD-10-CM

## 2018-10-22 DIAGNOSIS — Z794 Long term (current) use of insulin: Secondary | ICD-10-CM

## 2018-10-22 DIAGNOSIS — Z9221 Personal history of antineoplastic chemotherapy: Secondary | ICD-10-CM

## 2018-10-22 DIAGNOSIS — G629 Polyneuropathy, unspecified: Secondary | ICD-10-CM

## 2018-10-22 DIAGNOSIS — C55 Malignant neoplasm of uterus, part unspecified: Secondary | ICD-10-CM

## 2018-10-22 LAB — COMPREHENSIVE METABOLIC PANEL
ALT: 19 U/L (ref 0–44)
AST: 22 U/L (ref 15–41)
Albumin: 4.1 g/dL (ref 3.5–5.0)
Alkaline Phosphatase: 80 U/L (ref 38–126)
Anion gap: 12 (ref 5–15)
BUN: 17 mg/dL (ref 8–23)
CHLORIDE: 100 mmol/L (ref 98–111)
CO2: 25 mmol/L (ref 22–32)
Calcium: 9.2 mg/dL (ref 8.9–10.3)
Creatinine, Ser: 1.19 mg/dL — ABNORMAL HIGH (ref 0.44–1.00)
GFR calc Af Amer: 55 mL/min — ABNORMAL LOW (ref >60)
GFR, EST NON AFRICAN AMERICAN: 48 mL/min — AB (ref >60)
Glucose, Bld: 95 mg/dL (ref 70–99)
Potassium: 3.2 mmol/L — ABNORMAL LOW (ref 3.5–5.1)
Sodium: 137 mmol/L (ref 135–145)
Total Bilirubin: 0.6 mg/dL (ref 0.3–1.2)
Total Protein: 6.9 g/dL (ref 6.5–8.1)

## 2018-10-22 LAB — CBC WITH DIFFERENTIAL/PLATELET
Abs Immature Granulocytes: 0.01 10*3/uL (ref 0.00–0.07)
BASOS PCT: 1 %
Basophils Absolute: 0 10*3/uL (ref 0.0–0.1)
Eosinophils Absolute: 0.2 10*3/uL (ref 0.0–0.5)
Eosinophils Relative: 4 %
HCT: 31.5 % — ABNORMAL LOW (ref 36.0–46.0)
Hemoglobin: 9.8 g/dL — ABNORMAL LOW (ref 12.0–15.0)
Immature Granulocytes: 0 %
Lymphocytes Relative: 7 %
Lymphs Abs: 0.3 10*3/uL — ABNORMAL LOW (ref 0.7–4.0)
MCH: 30.4 pg (ref 26.0–34.0)
MCHC: 31.1 g/dL (ref 30.0–36.0)
MCV: 97.8 fL (ref 80.0–100.0)
MONOS PCT: 14 %
Monocytes Absolute: 0.6 10*3/uL (ref 0.1–1.0)
NEUTROS PCT: 74 %
Neutro Abs: 3.4 10*3/uL (ref 1.7–7.7)
Platelets: 164 10*3/uL (ref 150–400)
RBC: 3.22 MIL/uL — ABNORMAL LOW (ref 3.87–5.11)
RDW: 17.6 % — ABNORMAL HIGH (ref 11.5–15.5)
WBC: 4.5 10*3/uL (ref 4.0–10.5)
nRBC: 0 % (ref 0.0–0.2)

## 2018-10-22 LAB — SAMPLE TO BLOOD BANK

## 2018-10-22 LAB — LACTATE DEHYDROGENASE: LDH: 151 U/L (ref 98–192)

## 2018-10-22 MED ORDER — RUCAPARIB CAMSYLATE 300 MG PO TABS: 300.0000 mg | ORAL_TABLET | Freq: Two times a day (BID) | ORAL | 3 refills | Status: DC

## 2018-10-22 MED ORDER — FUROSEMIDE 20 MG PO TABS: 20.0000 mg | ORAL_TABLET | Freq: Two times a day (BID) | ORAL | 1 refills | Status: DC

## 2018-10-22 MED ORDER — RUCAPARIB CAMSYLATE 300 MG PO TABS: 600.0000 mg | ORAL_TABLET | Freq: Two times a day (BID) | ORAL | 2 refills | Status: DC

## 2018-10-22 MED ORDER — HYDROCODONE-ACETAMINOPHEN 10-325 MG PO TABS: 1.0000 | ORAL_TABLET | ORAL | 0 refills | Status: DC | PRN

## 2018-10-22 MED ORDER — RUCAPARIB CAMSYLATE 300 MG PO TABS: 300.0000 mg | ORAL_TABLET | Freq: Two times a day (BID) | ORAL | 0 refills | Status: DC

## 2018-10-22 NOTE — Progress Notes (Signed)
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 This may potentially be ovarian or a GI in etiology.    04/30/2016 Tumor Marker    CA 125- 7149.0 (H)    05/01/2016 Procedure    US paracentesis- Successful ultrasound-guided paracentesis yielding 1.8 liters of peritoneal fluid.    05/01/2016 Imaging    US pelvis- Both transabdominal and transvaginal sonography are significantly limited by large patient habitus and ascites. Neither uterus or ovaries were visualized on this exam.    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    05/08/2016 Imaging    CT chest- No evidence of metastatic disease in the chest. Peritoneal/omental disease with abdominal ascites in the upper abdomen, incompletely visualized.     05/13/2016 Procedure    Placement of single lumen Ellen a cath via right internal jugular vein. The catheter tip lies at the cavoatrial junction. A power injectable Ellen a cath was placed and is ready for immediate use.    05/15/2016 Procedure    US Paracentesis- 3400 ml yellow colored ascites removed    05/15/2016 - 09/18/2016 Chemotherapy    Carboplatin/Paclitaxel every 21 days x 7 cycles    07/01/2016 Miscellaneous    Genetic Counseling by [name]-  Genetic testing was normal, and did not reveal a deleterious mutation in these genes.     07/08/2016 Imaging    CT CAP- 1. Small volume ascites, significantly decreased. 2. Stable diffuse omental soft tissue caking and diffuse peritoneal thickening along the bilateral paracolic gutters and bilateral pelvic peritoneal reflections, consistent with peritoneal carcinomatosis. 3. Stable asymmetrically enlarged right ovary, which may represent the primary site of ovarian malignancy. 4. No evidence of metastatic disease in the chest. No new sites of metastatic disease in the abdomen or pelvis.    07/09/2016 Miscellaneous    Gyn Onc re-evaluation- modest response to therapy, 3 more cycles of chemotherapy recommended.      09/11/2016 Imaging    CT C/A/P No significant change omental soft tissue caking, consistent with metastatic disease. Mild ascites is decreased since previous study.  Increased calcification along peritoneal surface in pelvic cul-de-sac, consistent with treated peritoneal metastatic disease.  Stable 4.5cm homogeneous right pelvic mass, which favors a uterine fibroid although right ovarian neoplasm cannot definitely be excluded.  No new or progressive metastatic disease identified. No evidence of metastatic disease within the thorax.     10/14/2016 Procedure    Robotic-assisted laparoscopic total hysterectomy with bilateral salpingoophorectomy, ex lap omentectomy, radical tumor debulking by Dr. Denman George    10/17/2016 Pathology Results    Diagnosis 1. 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 - CERVIX AND ENDOMETRIUM FREE OF TUMOR. - SEE ONCOLOGY TABLE AND COMMENT. 2.  Soft tissue, biopsy, umbilical nodule - HIGH GRADE SEROUS CARCINOMA. 3. Omentum, resection for tumor - HIGH GRADE SEROUS CARCINOMA, 33 CM.    11/06/2016 - 12/25/2016  Chemotherapy    Carboplatin/Paclitaxel x 3 cycles     01/12/2017 Imaging    CT CAP- 1. Interval hysterectomy, bilateral salpingo-oophorectomy and omentectomy without evidence of tumor recurrence. 2. No evidence of metastatic disease. 3. 5 mm nonobstructing lower pole left renal calculus.    01/13/2017 Remission    No evidence of residual disease on CT imaging.    05/04/2017 Imaging    CT CAP- New small amount of ascites within the abdomen and pelvis since 01/12/2017 which could indicate disease progression but no new identifiable tumor and no significant change in omental and mild peritoneal thickening.  No evidence of metastatic disease within the chest.  Coronary artery disease.  Aortic Atherosclerosis (ICD10-I70.0).    05/04/2017 Tumor Marker    Patient's tumor was tested for the following markers: CA 125 Results of the tumor marker test revealed 3086    05/18/2017 Imaging    ECHO; EF 60% -  65    05/25/2017 - 07/07/2017 Chemotherapy    She received Doxil and Avastin. Treatment is stopped due to disease progression    06/01/2017 Procedure    Successful ultrasound-guided therapeutic paracentesis yielding 2.8 liters of peritoneal fluid    06/22/2017 Tumor Marker    Patient's tumor was tested for the following markers: CA 125 Results of the tumor marker test revealed 13440    07/20/2017 Imaging    Mild increase in peritoneal carcinoma within abdomen pelvis since previous study. No significant change and minimal ascites.  No evidence of metastatic disease within the thorax. New mild airspace opacity in left lower lobe, consistent with inflammatory or infectious etiology.    07/30/2017 Tumor Marker    Patient's tumor was tested for the following markers: CA 125 Results of the tumor marker test revealed 12099    07/30/2017 - 06/14/2018 Chemotherapy    The patient had gemzar. Cisplatin is added on 11/02/18    08/03/2017 - 08/05/2017 Hospital Admission    She was admitted to the  hospital for management of UTI and neutropenic fever    08/13/2017 Adverse Reaction    Dose of chemotherapy is reduced due to neutropenic sepsis    08/24/2017 Tumor Marker    Patient's tumor was tested for the following markers: CA 125 Results of the tumor marker test revealed 7431    09/21/2017 Tumor Marker    Patient's tumor was tested for the following markers: CA 125 Results of the tumor marker test revealed 4176    10/09/2017 Imaging    No significant change in peritoneal carcinomatosis since previous study.  No new or progressive metastatic disease within the abdomen or pelvis.  Stable tiny nonobstructive left renal calculus. No evidence of ureteral calculi or hydronephrosis.    11/10/2017 Tumor Marker    Patient's tumor was tested for the following markers: CA 125 Results of the tumor marker test revealed 4257    11/24/2017 Tumor Marker    Patient's tumor was tested for the following markers: CA 125 Results of the tumor marker test revealed 3377    01/12/2018 Tumor Marker    Patient's tumor was tested for the following markers: CA 125 Results of the tumor marker test revealed 1595    01/21/2018 Imaging    1. Stable to slightly improved omental, gastrohepatic ligament and pelvic disease as detailed above. No new/progressive findings. 2. No  acute abdominal or pelvic findings. 3. Stable lower pole left renal calculus.    02/08/2018 Tumor Marker    Patient's tumor was tested for the following markers: CA 125 Results of the tumor marker test revealed 1058    03/08/2018 Tumor Marker    Patient's tumor was tested for the following markers: CA-125 Results of the tumor marker test revealed 737.1    04/02/2018 Tumor Marker    Patient's tumor was tested for the following markers: CA-125 Results of the tumor marker test revealed 542.8    06/14/2018 Tumor Marker    CA 125- 1010    06/30/2018 Imaging    1. Mixed appearance but overall progression, with the left upper quadrant omental  tumor slightly improved but with some potential new deposition of tumor along the hepatic flexure margin. There is also new moderate ascites which must be considered suspicious for malignant ascites/peritoneal spread. 2. Other imaging findings of potential clinical significance: Nonobstructive left nephrolithiasis. Aortic Atherosclerosis (ICD10-I70.0). Mild subcutaneous edema along the sixth anterior abdominal wall potentially from mild panniculitis.    07/08/2018 Procedure    Successful ultrasound-guided paracentesis yielding 4 liters of peritoneal fluid.    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 female returns for routine follow-up for high-grade serous carcinoma of the peritoneum. She is having pain and takes her pain medication around the clock every 4 hours. Her pain is in her back and legs. She is still fatigued is unable to do any activities. Denies any nausea, vomiting, or diarrhea. Denies any new pains. Had not noticed any recent bleeding such as epistaxis, hematuria or hematochezia. Denies recent chest pain on exertion, shortness of breath on minimal exertion, pre-syncopal episodes, or palpitations. Denies any numbness or tingling in hands or feet. Denies any recent fevers, infections, or recent hospitalizations. She reports her appetite at 75% and her energy level at 25%. She is maintaining her weight well at this time.    REVIEW OF SYSTEMS:  Review of Systems  Constitutional: Positive for fatigue.  Musculoskeletal: Positive for back pain.  All other systems reviewed and are  negative.    PAST MEDICAL/SURGICAL HISTORY:  Past Medical History:  Diagnosis Date  . Diabetes mellitus without complication (HCC)    on insulin pump  . Dysrhythmia   . 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 Family history of breast cancer   . GERD (gastroesophageal reflux disease)   . History of blood transfusion   . History of bronchitis   . History of chemotherapy   . History of urinary tract infection   . Hyperlipidemia   . Hypertension   . Low serum vitamin D   . Ovarian cancer ([place]) 05/09/2016  . Shingles    Past Surgical History:  Procedure Laterality Date  . ABDOMINAL HYSTERECTOMY  10/14/2016  . CESAREAN SECTION    . DEBULKING N/A 10/14/2016   Procedure: DEBULKING;  Surgeon: Everitt Amber, MD;  Location: [facility];  Service: Gynecology;  Laterality: N/A;  . IR PARACENTESIS  06/01/2017  . LAPAROTOMY WITH STAGING N/A 10/14/2016  Procedure: LAPAROTOMY WITH OMENTECTOMY AND TUMOR DEBULGING;  Surgeon: Everitt Amber, MD;  Location: [facility];  Service: Gynecology;  Laterality: N/A;  . LUMBAR FUSION  08/21/15   L3-L4 Dr. Timmothy Euler  . OMENTECTOMY N/A 10/14/2016   Procedure: OMENTECTOMY;  Surgeon: Everitt Amber, MD;  Location: [facility];  Service: Gynecology;  Laterality: N/A;  . ROBOTIC ASSISTED TOTAL HYSTERECTOMY WITH BILATERAL SALPINGO OOPHERECTOMY Bilateral 10/14/2016   Procedure: XI ROBOTIC ASSISTED TOTAL LAPARSCOPIC  HYSTERECTOMY WITH BILATERAL SALPINGO OOPHORECTOMY;  Surgeon: Everitt Amber, MD;  Location: [facility];  Service: Gynecology;  Laterality: Bilateral;     SOCIAL HISTORY:  Social History   Socioeconomic History  . Marital status: Married    Spouse name: Gershon Mussel  . Number of children: 2  . Years of education: Not on file  . Highest education level: Not on file  Occupational History  . Occupation: retired  Scientific laboratory technician  . Financial resource strain: Not on file  . Food insecurity:    Worry: Not on file    Inability: Not on file  . Transportation needs:     Medical: Not on file    Non-medical: Not on file  Tobacco Use  . Smoking status: Never Smoker  . Smokeless tobacco: Never Used  Substance and Sexual Activity  . Alcohol use: No  . Drug use: No  . Sexual activity: Not on file    Comment: married  Lifestyle  . Physical activity:    Days per week: Not on file    Minutes per session: Not on file  . Stress: Not on file  Relationships  . Social connections:    Talks on phone: Not on file    Gets together: Not on file    Attends religious service: Not on file    Active member of club or organization: Not on file    Attends meetings of clubs or organizations: Not on file    Relationship status: Not on file  . Intimate partner violence:    Fear of current or ex partner: Not on file    Emotionally abused: Not on file    Physically abused: Not on file    Forced sexual activity: Not on file  Other Topics Concern  . Not on file  Social History Narrative  . Not on file    FAMILY HISTORY:  Family History  Problem Relation Age of Onset  . Lung cancer Mother        smoker; dx in her 39s  . Leukemia Father   . Diabetes Paternal Grandmother   . Heart attack Paternal Grandmother   . Diabetes Paternal Grandfather   . Breast cancer Paternal Aunt        dx in her 11s-30s  . Leukemia Paternal Uncle   . Heart attack Maternal Grandfather   . Breast cancer Cousin        maternal first cousin    CURRENT MEDICATIONS:  Outpatient Encounter Medications as of 10/22/2018  Medication Sig  . atorvastatin (LIPITOR) 40 MG tablet Take 1 tablet (40 mg total) by mouth daily.  . Biotin 1000 MCG tablet Take 1,000 mcg by mouth 3 (three) times daily.  . furosemide (LASIX) 40 MG tablet Take 1 tablet (40 mg total) by mouth 2 (two) times daily. (Patient taking differently: Take 40 mg by mouth daily. )  . HYDROcodone-acetaminophen (NORCO) 10-325 MG tablet Take 1 tablet by mouth every 4 (four) hours as needed.  . insulin lispro (HUMALOG) 100 UNIT/ML  injection USE IN  INSULIN PUMP AS DIRECTED. MAX DAILY DOSE OF 110 UNITS PER DAY. (Patient taking differently: Inject 110 Units into the skin See admin instructions. USE IN INSULIN PUMP AS DIRECTED. MAX DAILY DOSE OF 110 UNITS PER DAY.)  . lidocaine-prilocaine (EMLA) cream Apply a quarter size amount to Ellen site 1 hour prior to chemo. Do not rub in. Cover with plastic wrap.  . Multiple Vitamin (MULTIVITAMIN WITH MINERALS) TABS Take 1 tablet by mouth daily.  Marland Kitchen omeprazole (PRILOSEC) 40 MG capsule Take 1 capsule (40 mg total) by mouth daily.  . polyethylene glycol (MIRALAX / GLYCOLAX) packet Take 17 g by mouth daily as needed for mild constipation.   . prochlorperazine (COMPAZINE) 10 MG tablet Take 1 tablet (10 mg total) by mouth every 6 (six) hours as needed for nausea or vomiting.  . vitamin B-12 (CYANOCOBALAMIN) 1000 MCG tablet Take 1,000 mcg by mouth daily.  . vitamin E (VITAMIN E) 400 UNIT capsule Take 400 Units by mouth daily.  . [DISCONTINUED] HYDROcodone-acetaminophen (NORCO) 10-325 MG tablet Take 1 tablet by mouth every 4 (four) hours as needed.  . [DISCONTINUED] rucaparib camsylate (RUBRACA) 300 MG tablet Take 2 tablets (600 mg total) by mouth 2 (two) times daily. (Patient taking differently: Take 300 mg by mouth 2 (two) times daily. )  . [DISCONTINUED] rucaparib camsylate (RUBRACA) 300 MG tablet Take 1 tablet (300 mg total) by mouth 2 (two) times daily.  . furosemide (LASIX) 20 MG tablet Take 1 tablet (20 mg total) by mouth 2 (two) times daily.   No facility-administered encounter medications on file as of 10/22/2018.     ALLERGIES:  Allergies  Allergen Reactions  . Avandia [Rosiglitazone] Other (See Comments)    Legs swelled  . Micronase [Glyburide] Swelling  . Actos [Pioglitazone] Other (See Comments)    Edema / leg swelling     PHYSICAL EXAM:  ECOG Performance status: 1  Vitals:   10/22/18 1143  BP: (!) 159/69  Pulse: (!) 102  Resp: 18  Temp: 98.9 F (37.2 C)  SpO2: 98%    Filed Weights   10/22/18 1143  Weight: 207 lb (93.9 kg)    Physical Exam Constitutional:      Appearance: Normal appearance. She is normal weight.  Musculoskeletal: Normal range of motion.  Skin:    General: Skin is warm and dry.  Neurological:     Mental Status: She is alert and oriented to person, place, and time. Mental status is at baseline.  Psychiatric:        Mood and Affect: Mood normal.        Behavior: Behavior normal.        Thought Content: Thought content normal.        Judgment: Judgment normal.      LABORATORY DATA:  I have reviewed the labs as listed.  CBC    Component Value Date/Time   WBC 4.5 10/22/2018 1052   RBC 3.22 (L) 10/22/2018 1052   HGB 9.8 (L) 10/22/2018 1052   HGB 9.6 (L) 11/02/2017 0756   HCT 31.5 (L) 10/22/2018 1052   HCT 21.0 (L) 10/11/2018 0556   HCT 30.6 (L) 11/02/2017 0756   PLT 164 10/22/2018 1052   PLT 150 11/02/2017 0756   PLT 40 (LL) 11/18/2016 1642   MCV 97.8 10/22/2018 1052   MCV 108.5 (H) 11/02/2017 0756   MCH 30.4 10/22/2018 1052   MCHC 31.1 10/22/2018 1052   RDW 17.6 (H) 10/22/2018 1052   RDW 17.8 (H) 11/02/2017 6333  LYMPHSABS 0.3 (L) 10/22/2018 1052   LYMPHSABS 0.4 (L) 11/02/2017 0756   MONOABS 0.6 10/22/2018 1052   MONOABS 0.5 11/02/2017 0756   EOSABS 0.2 10/22/2018 1052   EOSABS 0.7 (H) 11/02/2017 0756   EOSABS 0.1 11/18/2016 1642   BASOSABS 0.0 10/22/2018 1052   BASOSABS 0.0 11/02/2017 0756   CMP Latest Ref Rng & Units 10/22/2018 10/15/2018 10/11/2018  Glucose 70 - 99 mg/dL 95 87 140(H)  BUN 8 - 23 mg/dL 17 21 35(H)  Creatinine 0.44 - 1.00 mg/dL 1.19(H) 1.24(H) 2.63(H)  Sodium 135 - 145 mmol/L 137 137 137  Potassium 3.5 - 5.1 mmol/L 3.2(L) 3.9 4.6  Chloride 98 - 111 mmol/L 100 104 105  CO2 22 - 32 mmol/L '25 25 24  ' Calcium 8.9 - 10.3 mg/dL 9.2 9.3 8.6(L)  Total Protein 6.5 - 8.1 g/dL 6.9 6.9 6.1(L)  Total Bilirubin 0.3 - 1.2 mg/dL 0.6 0.5 0.6  Alkaline Phos 38 - 126 U/L 80 87 67  AST 15 - 41 U/L '22 27  20  ' ALT 0 - 44 U/L '19 31 15       ' DIAGNOSTIC IMAGING:  I have independently reviewed the scans and discussed with the patient.   I have reviewed Ellen Finders, NP's note and agree with the documentation.  I personally performed a face-to-face visit, made revisions and my assessment and plan is as follows.    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 -Underwent 7 cycles of chemotherapy with carboplatin and paclitaxel from 06/01/2016 through 09/18/2016. -Germline mutation testing on 06/25/2016 was negative. - Underwent robotic assisted tumor debulking surgery on 10/14/2016 by Dr. Denman George. -3 cycles of chemotherapy with carboplatin and paclitaxel from 11/06/2016 through 12/25/2016. - Second line therapy with Doxil and bevacizumab from 05/25/2017 through 07/07/2017. - Third line therapy with gemcitabine from 07/30/2017 through December 2018, gemcitabine and cisplatin day 1 and day 8 q. 21 days from 11/02/2017, 7 cycles completed on 06/14/2018, with CT scan on 07/01/2018 showing progression. -She underwent paracentesis, with 4 L removed on 07/08/2018.  There is a subcutaneous area of skin thickening at the paracentesis site, questionable for seeding. - 3 cycles of docetaxel 75 mg/m from 07/16/2018 through 08/26/2018. - Ca1 25 levels on 07/01/2018 was 2005, 2367 on day of cycle 2, 2333 on day of cycle 3. -We discussed the results of the CT scan CAP which showed progression of her disease.  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  FDA approved therapeutic option rucaparib was suggested. - Rucaparib 600 mg twice daily started on 09/25/2018. -She had paracentesis on 09/21/2018, 7 L fluid removed. - 7 days after starting  rucaparib, she started feeling very weak and nauseous.  Rucaparib was held on 10/05/2018 due to severe weakness.  -She was admitted to the hospital on 10/08/2018 through 10/11/2018 with weakness, and renal failure. -She received 1 unit of PRBC.  CT abdomen done on 10/09/2018 showed stable omental and peritoneal disease.  She underwent paracentesis on 10/11/2018 with 4 L of fluid removed. - I have decreased rucaparib to 300 mg twice a day on 10/15/2018. -She is tolerating it so far very well.  She did have some shakes in her hands since yesterday.  Denies any major weakness or nausea or vomiting. - We have  reviewed her blood work today.  They are either improved or stable.  I will see her back in 10 days with repeat blood work.  2.  Malignant ascites: -Paracentesis on 07/08/2018, 4 L removed. -Paracentesis on 09/21/2018, 7 L removed. -Paracentesis on 10/11/2018 with 4 L removed. -She was started back on Lasix as needed..  3.  CKD: -Creatinine has improved to 1.14.  Benazepril and metformin have been discontinued.  4.  Diabetes type 2: -She is on insulin pump and her sugars are fairly well controlled.  5.  Right posterior thigh pain: -She has right lower back pain and posterior thigh pain since October 2016 from her back problems. -She is taking hydrocodone every 4 hours since the recent hospitalization.  Previously she used to take every 6 hours.  We will send a refill for it.  6.  Peripheral neuropathy: -She has on and off numbness in the toes.  No numbness in the hands reported.  She was prescribed gabapentin few months ago but she is not taking it.      Orders placed this encounter:  Orders Placed This Encounter  Procedures  . Lactate dehydrogenase  . Magnesium  . CBC with Differential/Platelet  . Comprehensive metabolic panel      Derek Jack, MD [place] [phone]

## 2018-10-22 NOTE — Assessment & Plan Note (Signed)
1.  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 -Underwent 7 cycles of chemotherapy with carboplatin and paclitaxel from 06/01/2016 through 09/18/2016. -Germline mutation testing on 06/25/2016 was negative. - Underwent robotic assisted tumor debulking surgery on 10/14/2016 by Dr. Denman George. -3 cycles of chemotherapy with carboplatin and paclitaxel from 11/06/2016 through 12/25/2016. - Second line therapy with Doxil and bevacizumab from 05/25/2017 through 07/07/2017. - Third line therapy with gemcitabine from 07/30/2017 through December 2018, gemcitabine and cisplatin day 1 and day 8 q. 21 days from 11/02/2017, 7 cycles completed on 06/14/2018, with CT scan on 07/01/2018 showing progression. -She underwent paracentesis, with 4 L removed on 07/08/2018.  There is a subcutaneous area of skin thickening at the paracentesis site, questionable for seeding. - 3 cycles of docetaxel 75 mg/m from 07/16/2018 through 08/26/2018. - Ca1 25 levels on 07/01/2018 was 2005, 2367 on day of cycle 2, 2333 on day of cycle 3. -We discussed the results of the CT scan CAP which showed progression of her disease.  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  FDA approved therapeutic option rucaparib was suggested. - Rucaparib 600 mg twice daily started on 09/25/2018. -She had paracentesis on 09/21/2018, 7 L fluid removed. - 7 days after starting rucaparib, she started feeling very weak and nauseous.  Rucaparib was held on 10/05/2018 due to severe weakness.  -She was admitted to the hospital on 10/08/2018 through 10/11/2018 with weakness, and renal failure. -She received 1 unit of PRBC.  CT abdomen done on 10/09/2018 showed stable omental and peritoneal disease.  She underwent paracentesis on 10/11/2018 with 4 L of fluid removed. - I have  decreased rucaparib to 300 mg twice a day on 10/15/2018. -She is tolerating it so far very well.  She did have some shakes in her hands since yesterday.  Denies any major weakness or nausea or vomiting. - We have reviewed her blood work today.  They are either improved or stable.  I will see her back in 10 days with repeat blood work.  2.  Malignant ascites: -Paracentesis on 07/08/2018, 4 L removed. -Paracentesis on 09/21/2018, 7 L removed. -Paracentesis on 10/11/2018 with 4 L removed. -She was started back on Lasix as needed..  3.  CKD: -Creatinine has improved to 1.14.  Benazepril and metformin have been discontinued.  4.  Diabetes type 2: -She is on insulin pump and her sugars are fairly well controlled.  5.  Right posterior thigh pain: -She has right lower back pain and posterior thigh pain since October 2016 from her back problems. -She is taking hydrocodone every 4 hours since the recent hospitalization.  Previously she used to take every 6 hours.  We will send a refill for it.  6.  Peripheral neuropathy: -She has on and off numbness in the toes.  No numbness in the hands reported.  She was prescribed gabapentin few months ago but she is not taking it.

## 2018-10-22 NOTE — Patient Instructions (Signed)
[facility] at [facility] Discharge Instructions     Thank you for choosing [facility] at [facility] to provide your oncology and hematology care.  To afford each patient quality time with our provider, please arrive at least 15 minutes before your scheduled appointment time.   If you have a lab appointment with the Cancer Center please come in thru the  Main Entrance and check in at the main information desk  You need to re-schedule your appointment should you arrive 10 or more minutes late.  We strive to give you quality time with our providers, and arriving late affects you and other patients whose appointments are after yours.  Also, if you no show three or more times for appointments you may be dismissed from the clinic at the providers discretion.     Again, thank you for choosing [facility].  Our hope is that these requests will decrease the amount of time that you wait before being seen by our physicians.       _____________________________________________________________  Should you have questions after your visit to [facility], please contact our office at [phone] between the hours of 8:00 a.m. and 4:30 p.m.  Voicemails left after 4:00 p.m. will not be returned until the following business day.  For prescription refill requests, have your pharmacy contact our office and allow 72 hours.    Cancer Center Support Programs:   > Cancer Support Group  2nd Tuesday of the month 1pm-2pm, Journey Room    

## 2018-10-31 ENCOUNTER — Encounter: Payer: Self-pay | Admitting: Internal Medicine

## 2018-11-01 ENCOUNTER — Inpatient Hospital Stay (HOSPITAL_COMMUNITY): Payer: Medicare Other | Admitting: Hematology

## 2018-11-01 ENCOUNTER — Other Ambulatory Visit: Payer: Self-pay

## 2018-11-01 ENCOUNTER — Encounter (HOSPITAL_COMMUNITY): Payer: Self-pay | Admitting: Hematology

## 2018-11-01 ENCOUNTER — Inpatient Hospital Stay (HOSPITAL_COMMUNITY): Payer: Medicare Other

## 2018-11-01 VITALS — BP 146/62 | HR 102 | Temp 97.7°F | Resp 18 | Wt 207.2 lb

## 2018-11-01 DIAGNOSIS — N189 Chronic kidney disease, unspecified: Secondary | ICD-10-CM | POA: Diagnosis not present

## 2018-11-01 DIAGNOSIS — Z79899 Other long term (current) drug therapy: Secondary | ICD-10-CM

## 2018-11-01 DIAGNOSIS — Z9221 Personal history of antineoplastic chemotherapy: Secondary | ICD-10-CM

## 2018-11-01 DIAGNOSIS — C55 Malignant neoplasm of uterus, part unspecified: Secondary | ICD-10-CM

## 2018-11-01 DIAGNOSIS — Z794 Long term (current) use of insulin: Secondary | ICD-10-CM

## 2018-11-01 DIAGNOSIS — C786 Secondary malignant neoplasm of retroperitoneum and peritoneum: Secondary | ICD-10-CM

## 2018-11-01 DIAGNOSIS — G629 Polyneuropathy, unspecified: Secondary | ICD-10-CM

## 2018-11-01 DIAGNOSIS — Z90710 Acquired absence of both cervix and uterus: Secondary | ICD-10-CM

## 2018-11-01 DIAGNOSIS — D6481 Anemia due to antineoplastic chemotherapy: Secondary | ICD-10-CM

## 2018-11-01 DIAGNOSIS — E1122 Type 2 diabetes mellitus with diabetic chronic kidney disease: Secondary | ICD-10-CM

## 2018-11-01 DIAGNOSIS — C481 Malignant neoplasm of specified parts of peritoneum: Secondary | ICD-10-CM

## 2018-11-01 DIAGNOSIS — T451X5A Adverse effect of antineoplastic and immunosuppressive drugs, initial encounter: Principal | ICD-10-CM

## 2018-11-01 DIAGNOSIS — C569 Malignant neoplasm of unspecified ovary: Secondary | ICD-10-CM

## 2018-11-01 DIAGNOSIS — M545 Low back pain: Secondary | ICD-10-CM

## 2018-11-01 DIAGNOSIS — R180 Malignant ascites: Secondary | ICD-10-CM

## 2018-11-01 DIAGNOSIS — Z90722 Acquired absence of ovaries, bilateral: Secondary | ICD-10-CM

## 2018-11-01 LAB — CBC WITH DIFFERENTIAL/PLATELET
ABS IMMATURE GRANULOCYTES: 0.02 10*3/uL (ref 0.00–0.07)
Basophils Absolute: 0 10*3/uL (ref 0.0–0.1)
Basophils Relative: 1 %
Eosinophils Absolute: 0.1 10*3/uL (ref 0.0–0.5)
Eosinophils Relative: 1 %
HCT: 34.4 % — ABNORMAL LOW (ref 36.0–46.0)
Hemoglobin: 10.6 g/dL — ABNORMAL LOW (ref 12.0–15.0)
Immature Granulocytes: 0 %
Lymphocytes Relative: 6 %
Lymphs Abs: 0.3 10*3/uL — ABNORMAL LOW (ref 0.7–4.0)
MCH: 30.2 pg (ref 26.0–34.0)
MCHC: 30.8 g/dL (ref 30.0–36.0)
MCV: 98 fL (ref 80.0–100.0)
Monocytes Absolute: 0.7 10*3/uL (ref 0.1–1.0)
Monocytes Relative: 12 %
Neutro Abs: 4.6 10*3/uL (ref 1.7–7.7)
Neutrophils Relative %: 80 %
Platelets: 206 10*3/uL (ref 150–400)
RBC: 3.51 MIL/uL — AB (ref 3.87–5.11)
RDW: 17.8 % — ABNORMAL HIGH (ref 11.5–15.5)
WBC: 5.7 10*3/uL (ref 4.0–10.5)
nRBC: 0 % (ref 0.0–0.2)

## 2018-11-01 LAB — COMPREHENSIVE METABOLIC PANEL
ALT: 13 U/L (ref 0–44)
AST: 22 U/L (ref 15–41)
Albumin: 3.6 g/dL (ref 3.5–5.0)
Alkaline Phosphatase: 70 U/L (ref 38–126)
Anion gap: 11 (ref 5–15)
BUN: 25 mg/dL — ABNORMAL HIGH (ref 8–23)
CO2: 26 mmol/L (ref 22–32)
Calcium: 8.8 mg/dL — ABNORMAL LOW (ref 8.9–10.3)
Chloride: 101 mmol/L (ref 98–111)
Creatinine, Ser: 1.43 mg/dL — ABNORMAL HIGH (ref 0.44–1.00)
GFR calc Af Amer: 44 mL/min — ABNORMAL LOW (ref >60)
GFR calc non Af Amer: 38 mL/min — ABNORMAL LOW (ref >60)
Glucose, Bld: 85 mg/dL (ref 70–99)
Potassium: 2.9 mmol/L — ABNORMAL LOW (ref 3.5–5.1)
SODIUM: 138 mmol/L (ref 135–145)
Total Bilirubin: 0.7 mg/dL (ref 0.3–1.2)
Total Protein: 6.3 g/dL — ABNORMAL LOW (ref 6.5–8.1)

## 2018-11-01 LAB — LACTATE DEHYDROGENASE: LDH: 164 U/L (ref 98–192)

## 2018-11-01 LAB — SAMPLE TO BLOOD BANK

## 2018-11-01 LAB — MAGNESIUM: Magnesium: 1.3 mg/dL — ABNORMAL LOW (ref 1.7–2.4)

## 2018-11-01 MED ORDER — POTASSIUM CHLORIDE CRYS ER 20 MEQ PO TBCR: 20.0000 meq | EXTENDED_RELEASE_TABLET | Freq: Every day | ORAL | 3 refills | Status: DC

## 2018-11-01 MED ORDER — GLUCOSE BLOOD VI STRP: ORAL_STRIP | 2 refills | Status: AC

## 2018-11-01 NOTE — Patient Instructions (Signed)
[facility] at [facility] Discharge Instructions     Thank you for choosing [facility] at [facility] to provide your oncology and hematology care.  To afford each patient quality time with our provider, please arrive at least 15 minutes before your scheduled appointment time.   If you have a lab appointment with the Cancer Center please come in thru the  Main Entrance and check in at the main information desk  You need to re-schedule your appointment should you arrive 10 or more minutes late.  We strive to give you quality time with our providers, and arriving late affects you and other patients whose appointments are after yours.  Also, if you no show three or more times for appointments you may be dismissed from the clinic at the providers discretion.     Again, thank you for choosing [facility].  Our hope is that these requests will decrease the amount of time that you wait before being seen by our physicians.       _____________________________________________________________  Should you have questions after your visit to [facility], please contact our office at [phone] between the hours of 8:00 a.m. and 4:30 p.m.  Voicemails left after 4:00 p.m. will not be returned until the following business day.  For prescription refill requests, have your pharmacy contact our office and allow 72 hours.    Cancer Center Support Programs:   > Cancer Support Group  2nd Tuesday of the month 1pm-2pm, Journey Room    

## 2018-11-01 NOTE — Assessment & Plan Note (Signed)
1.  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 -Underwent 7 cycles of chemotherapy with carboplatin and paclitaxel from 06/01/2016 through 09/18/2016. -Germline mutation testing on 06/25/2016 was negative. - Underwent robotic assisted tumor debulking surgery on 10/14/2016 by Dr. Denman George. -3 cycles of chemotherapy with carboplatin and paclitaxel from 11/06/2016 through 12/25/2016. - Second line therapy with Doxil and bevacizumab from 05/25/2017 through 07/07/2017. - Third line therapy with gemcitabine from 07/30/2017 through December 2018, gemcitabine and cisplatin day 1 and day 8 q. 21 days from 11/02/2017, 7 cycles completed on 06/14/2018, with CT scan on 07/01/2018 showing progression. -She underwent paracentesis, with 4 L removed on 07/08/2018.  There is a subcutaneous area of skin thickening at the paracentesis site, questionable for seeding. - 3 cycles of docetaxel 75 mg/m from 07/16/2018 through 08/26/2018. - Ca1 25 levels on 07/01/2018 was 2005, 2367 on day of cycle 2, 2333 on day of cycle 3. -We discussed the results of the CT scan CAP which showed progression of her disease.  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  FDA approved therapeutic option rucaparib was suggested. - Rucaparib 600 mg twice daily started on 09/25/2018. -She had paracentesis on 09/21/2018, 7 L fluid removed. - 7 days after starting rucaparib, she started feeling very weak and nauseous.  Rucaparib was held on 10/05/2018 due to severe weakness.  -She was admitted to the hospital on 10/08/2018 through 10/11/2018 with weakness, and renal failure. -She received 1 unit of PRBC.  CT abdomen done on 10/09/2018 showed stable omental and peritoneal disease.  She underwent paracentesis on 10/11/2018 with 4 L of fluid removed. - I have  decreased rucaparib to 300 mg twice a day on 10/15/2018. -She is continuing to tolerate rucaparib very well.  Denies any diarrhea or vomiting.  Occasional nausea present. - We reviewed her blood work.  Hemoglobin has improved. - I have told her to restart taking magnesium 400 mg twice daily as her magnesium level was low today at 1.3. - She was also told to restart back on potassium 20 mEq daily.  Her potassium today is 2.9. -We will see her back in 1 week to repeat blood work.  We will closely monitor her creatinine as it has slightly worsened.  2.  Malignant ascites: -Paracentesis on 07/08/2018, 4 L removed. -Paracentesis on 09/21/2018, 7 L removed. -Paracentesis on 10/11/2018 with 4 L removed. -She was started back on Lasix as needed..  3.  CKD: -Creatinine has increased to 1.4.  We will keep a close eye on it.  Benzapril and metformin are on hold.  She takes Lasix 20 mg twice daily.  4.  Diabetes type 2: -She is on insulin pump and her sugars are fairly well controlled.  5.  Right posterior thigh pain: -She has right lower back pain and posterior thigh pain since October 2016 from her back problems. -She is taking hydrocodone every 4 hours since the recent hospitalization.  Previously she used to take every 6 hours.  We will send a refill for it.  6.  Peripheral neuropathy: -She has on and off numbness in the toes.  No numbness in the hands reported.  She was prescribed gabapentin few months ago but she is not taking it.

## 2018-11-01 NOTE — Progress Notes (Signed)
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 This may potentially be ovarian or a GI in etiology.    04/30/2016 Tumor Marker    CA 125- 7149.0 (H)    05/01/2016 Procedure    US paracentesis- Successful ultrasound-guided paracentesis yielding 1.8 liters of peritoneal fluid.    05/01/2016 Imaging    US pelvis- Both transabdominal and transvaginal sonography are significantly limited by large patient habitus and ascites. Neither uterus or ovaries were visualized on this exam.    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    05/08/2016 Imaging    CT chest- No evidence of metastatic disease in the chest. Peritoneal/omental disease with abdominal ascites in the upper abdomen, incompletely visualized.     05/13/2016 Procedure    Placement of single lumen port a cath via right internal jugular vein. The catheter tip lies at the cavoatrial junction. A power injectable port a cath was placed and is ready for immediate use.    05/15/2016 Procedure    US Paracentesis- 3400 ml yellow colored ascites removed    05/15/2016 - 09/18/2016 Chemotherapy    Carboplatin/Paclitaxel every 21 days x 7 cycles    07/01/2016 Miscellaneous    Genetic Counseling by [name]-  Genetic testing was normal, and did not reveal a deleterious mutation in these genes.     07/08/2016 Imaging    CT CAP- 1. Small volume ascites, significantly decreased. 2. Stable diffuse omental soft tissue caking and diffuse peritoneal thickening along the bilateral paracolic gutters and bilateral pelvic peritoneal reflections, consistent with peritoneal carcinomatosis. 3. Stable asymmetrically enlarged right ovary, which may represent the primary site of ovarian malignancy. 4. No evidence of metastatic disease in the chest. No new sites of metastatic disease in the abdomen or pelvis.    07/09/2016 Miscellaneous    Gyn Onc re-evaluation- modest response to therapy, 3 more cycles of chemotherapy recommended.      09/11/2016 Imaging    CT C/A/P No significant change omental soft tissue caking, consistent with metastatic disease. Mild ascites is decreased since previous study.  Increased calcification along peritoneal surface in pelvic cul-de-sac, consistent with treated peritoneal metastatic disease.  Stable 4.5cm homogeneous right pelvic mass, which favors a uterine fibroid although right ovarian neoplasm cannot definitely be excluded.  No new or progressive metastatic disease identified. No evidence of metastatic disease within the thorax.     10/14/2016 Procedure    Robotic-assisted laparoscopic total hysterectomy with bilateral salpingoophorectomy, ex lap omentectomy, radical tumor debulking by Dr. Denman George    10/17/2016 Pathology Results    Diagnosis 1. 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 - CERVIX AND ENDOMETRIUM FREE OF TUMOR. - SEE ONCOLOGY TABLE AND COMMENT. 2.  Soft tissue, biopsy, umbilical nodule - HIGH GRADE SEROUS CARCINOMA. 3. Omentum, resection for tumor - HIGH GRADE SEROUS CARCINOMA, 33 CM.    11/06/2016 - 12/25/2016  Chemotherapy    Carboplatin/Paclitaxel x 3 cycles     01/12/2017 Imaging    CT CAP- 1. Interval hysterectomy, bilateral salpingo-oophorectomy and omentectomy without evidence of tumor recurrence. 2. No evidence of metastatic disease. 3. 5 mm nonobstructing lower pole left renal calculus.    01/13/2017 Remission    No evidence of residual disease on CT imaging.    05/04/2017 Imaging    CT CAP- New small amount of ascites within the abdomen and pelvis since 01/12/2017 which could indicate disease progression but no new identifiable tumor and no significant change in omental and mild peritoneal thickening.  No evidence of metastatic disease within the chest.  Coronary artery disease.  Aortic Atherosclerosis (ICD10-I70.0).    05/04/2017 Tumor Marker    Patient's tumor was tested for the following markers: CA 125 Results of the tumor marker test revealed 7939    05/18/2017 Imaging    ECHO; EF 60% -  65    05/25/2017 - 07/07/2017 Chemotherapy    She received Doxil and Avastin. Treatment is stopped due to disease progression    06/01/2017 Procedure    Successful ultrasound-guided therapeutic paracentesis yielding 2.8 liters of peritoneal fluid    06/22/2017 Tumor Marker    Patient's tumor was tested for the following markers: CA 125 Results of the tumor marker test revealed 13440    07/20/2017 Imaging    Mild increase in peritoneal carcinoma within abdomen pelvis since previous study. No significant change and minimal ascites.  No evidence of metastatic disease within the thorax. New mild airspace opacity in left lower lobe, consistent with inflammatory or infectious etiology.    07/30/2017 Tumor Marker    Patient's tumor was tested for the following markers: CA 125 Results of the tumor marker test revealed 12099    07/30/2017 - 06/14/2018 Chemotherapy    The patient had gemzar. Cisplatin is added on 11/02/18    08/03/2017 - 08/05/2017 Hospital Admission    She was admitted to the  hospital for management of UTI and neutropenic fever    08/13/2017 Adverse Reaction    Dose of chemotherapy is reduced due to neutropenic sepsis    08/24/2017 Tumor Marker    Patient's tumor was tested for the following markers: CA 125 Results of the tumor marker test revealed 7431    09/21/2017 Tumor Marker    Patient's tumor was tested for the following markers: CA 125 Results of the tumor marker test revealed 4176    10/09/2017 Imaging    No significant change in peritoneal carcinomatosis since previous study.  No new or progressive metastatic disease within the abdomen or pelvis.  Stable tiny nonobstructive left renal calculus. No evidence of ureteral calculi or hydronephrosis.    11/10/2017 Tumor Marker    Patient's tumor was tested for the following markers: CA 125 Results of the tumor marker test revealed 4257    11/24/2017 Tumor Marker    Patient's tumor was tested for the following markers: CA 125 Results of the tumor marker test revealed 3377    01/12/2018 Tumor Marker    Patient's tumor was tested for the following markers: CA 125 Results of the tumor marker test revealed 1595    01/21/2018 Imaging    1. Stable to slightly improved omental, gastrohepatic ligament and pelvic disease as detailed above. No new/progressive findings. 2. No  acute abdominal or pelvic findings. 3. Stable lower pole left renal calculus.    02/08/2018 Tumor Marker    Patient's tumor was tested for the following markers: CA 125 Results of the tumor marker test revealed 1058    03/08/2018 Tumor Marker    Patient's tumor was tested for the following markers: CA-125 Results of the tumor marker test revealed 737.1    04/02/2018 Tumor Marker    Patient's tumor was tested for the following markers: CA-125 Results of the tumor marker test revealed 542.8    06/14/2018 Tumor Marker    CA 125- 1010    06/30/2018 Imaging    1. Mixed appearance but overall progression, with the left upper quadrant omental  tumor slightly improved but with some potential new deposition of tumor along the hepatic flexure margin. There is also new moderate ascites which must be considered suspicious for malignant ascites/peritoneal spread. 2. Other imaging findings of potential clinical significance: Nonobstructive left nephrolithiasis. Aortic Atherosclerosis (ICD10-I70.0). Mild subcutaneous edema along the sixth anterior abdominal wall potentially from mild panniculitis.    07/08/2018 Procedure    Successful ultrasound-guided paracentesis yielding 4 liters of peritoneal fluid.    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 Lantz 67 y.o. female returns for routine follow-up for High-grade serous carcinoma of the peritoneum. She is here today with her husband and she is tolerating the medication well. She is fatigued throughout the day and has constant hip pain. Denies any nausea, vomiting, or diarrhea. Denies any new pains. Had not noticed any recent bleeding such as epistaxis, hematuria or hematochezia. Denies recent chest pain on exertion, pre-syncopal episodes, or palpitations. Denies any numbness or tingling in hands or feet. Denies any recent fevers, infections, or recent hospitalizations. She reports her appetite and energy level at 25%.     REVIEW OF SYSTEMS:  Review of Systems  Constitutional: Positive for fatigue.  Psychiatric/Behavioral: Positive for sleep disturbance.     PAST MEDICAL/SURGICAL HISTORY:  Past Medical History:  Diagnosis Date  . Diabetes mellitus without complication (HCC)    on insulin pump    . Dysrhythmia   . 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 Family history of breast cancer   . GERD (gastroesophageal reflux disease)   . History of blood transfusion   . History of bronchitis   . History of chemotherapy   . History of urinary tract infection   . Hyperlipidemia   . Hypertension   . Low serum vitamin D   . Ovarian cancer ([place]) 05/09/2016  . Shingles    Past Surgical History:  Procedure Laterality Date  . ABDOMINAL HYSTERECTOMY  10/14/2016  . CESAREAN SECTION    . DEBULKING N/A 10/14/2016   Procedure: DEBULKING;  Surgeon: Everitt Amber, MD;  Location: [facility];  Service: Gynecology;  Laterality: N/A;  . IR PARACENTESIS  06/01/2017  . LAPAROTOMY WITH STAGING N/A 10/14/2016   Procedure: LAPAROTOMY WITH OMENTECTOMY AND TUMOR DEBULGING;  Surgeon: Everitt Amber, MD;  Location: [facility];  Service: Gynecology;  Laterality: N/A;  . LUMBAR FUSION  08/21/15  L3-L4 Dr. Timmothy Euler  . OMENTECTOMY N/A 10/14/2016   Procedure: OMENTECTOMY;  Surgeon: Everitt Amber, MD;  Location: [facility];  Service: Gynecology;  Laterality: N/A;  . ROBOTIC ASSISTED TOTAL HYSTERECTOMY WITH BILATERAL SALPINGO OOPHERECTOMY Bilateral 10/14/2016   Procedure: XI ROBOTIC ASSISTED TOTAL LAPARSCOPIC  HYSTERECTOMY WITH BILATERAL SALPINGO OOPHORECTOMY;  Surgeon: Everitt Amber, MD;  Location: [facility];  Service: Gynecology;  Laterality: Bilateral;     SOCIAL HISTORY:  Social History   Socioeconomic History  . Marital status: Married    Spouse name: Gershon Mussel  . Number of children: 2  . Years of education: Not on file  . Highest education level: Not on file  Occupational History  . Occupation: retired  Scientific laboratory technician  . Financial resource strain: Not on file  . Food insecurity:    Worry: Not on file    Inability: Not on file  . Transportation needs:    Medical: Not on file    Non-medical: Not on file  Tobacco Use  . Smoking status: Never Smoker  . Smokeless tobacco: Never Used  Substance and Sexual  Activity  . Alcohol use: No  . Drug use: No  . Sexual activity: Not on file    Comment: married  Lifestyle  . Physical activity:    Days per week: Not on file    Minutes per session: Not on file  . Stress: Not on file  Relationships  . Social connections:    Talks on phone: Not on file    Gets together: Not on file    Attends religious service: Not on file    Active member of club or organization: Not on file    Attends meetings of clubs or organizations: Not on file    Relationship status: Not on file  . Intimate partner violence:    Fear of current or ex partner: Not on file    Emotionally abused: Not on file    Physically abused: Not on file    Forced sexual activity: Not on file  Other Topics Concern  . Not on file  Social History Narrative  . Not on file    FAMILY HISTORY:  Family History  Problem Relation Age of Onset  . Lung cancer Mother        smoker; dx in her 29s  . Leukemia Father   . Diabetes Paternal Grandmother   . Heart attack Paternal Grandmother   . Diabetes Paternal Grandfather   . Breast cancer Paternal Aunt        dx in her 69s-30s  . Leukemia Paternal Uncle   . Heart attack Maternal Grandfather   . Breast cancer Cousin        maternal first cousin    CURRENT MEDICATIONS:  Outpatient Encounter Medications as of 11/01/2018  Medication Sig  . atorvastatin (LIPITOR) 40 MG tablet Take 1 tablet (40 mg total) by mouth daily.  . Biotin 1000 MCG tablet Take 1,000 mcg by mouth 3 (three) times daily.  . furosemide (LASIX) 20 MG tablet Take 1 tablet (20 mg total) by mouth 2 (two) times daily.  . furosemide (LASIX) 40 MG tablet Take 1 tablet (40 mg total) by mouth 2 (two) times daily. (Patient taking differently: Take 40 mg by mouth daily. )  . HYDROcodone-acetaminophen (NORCO) 10-325 MG tablet Take 1 tablet by mouth every 4 (four) hours as needed.  . insulin lispro (HUMALOG) 100 UNIT/ML injection USE IN INSULIN PUMP AS DIRECTED. MAX DAILY DOSE OF 110  UNITS PER  DAY. (Patient taking differently: Inject 110 Units into the skin See admin instructions. USE IN INSULIN PUMP AS DIRECTED. MAX DAILY DOSE OF 110 UNITS PER DAY.)  . lidocaine-prilocaine (EMLA) cream Apply a quarter size amount to port site 1 hour prior to chemo. Do not rub in. Cover with plastic wrap.  . Multiple Vitamin (MULTIVITAMIN WITH MINERALS) TABS Take 1 tablet by mouth daily.  Marland Kitchen omeprazole (PRILOSEC) 40 MG capsule Take 1 capsule (40 mg total) by mouth daily.  . polyethylene glycol (MIRALAX / GLYCOLAX) packet Take 17 g by mouth daily as needed for mild constipation.   . prochlorperazine (COMPAZINE) 10 MG tablet Take 1 tablet (10 mg total) by mouth every 6 (six) hours as needed for nausea or vomiting.  . rucaparib camsylate (RUBRACA) 300 MG tablet Take 1 tablet (300 mg total) by mouth 2 (two) times daily.  . vitamin B-12 (CYANOCOBALAMIN) 1000 MCG tablet Take 1,000 mcg by mouth daily.  . vitamin E (VITAMIN E) 400 UNIT capsule Take 400 Units by mouth daily.  . potassium chloride SA (K-DUR,KLOR-CON) 20 MEQ tablet Take 1 tablet (20 mEq total) by mouth daily.   No facility-administered encounter medications on file as of 11/01/2018.     ALLERGIES:  Allergies  Allergen Reactions  . Avandia [Rosiglitazone] Other (See Comments)    Legs swelled  . Micronase [Glyburide] Swelling  . Actos [Pioglitazone] Other (See Comments)    Edema / leg swelling     PHYSICAL EXAM:  ECOG Performance status: 1  Vitals:   11/01/18 1348  BP: (!) 146/62  Pulse: (!) 102  Resp: 18  Temp: 97.7 F (36.5 C)  SpO2: 96%   Filed Weights   11/01/18 1348  Weight: 207 lb 3.2 oz (94 kg)    Physical Exam Constitutional:      Appearance: Normal appearance. She is normal weight.  Cardiovascular:     Rate and Rhythm: Normal rate and regular rhythm.     Heart sounds: Normal heart sounds.  Pulmonary:     Effort: Pulmonary effort is normal.     Breath sounds: Normal breath sounds.  Abdominal:      General: There is distension.  Musculoskeletal:     Comments: Uses a wheelchair   Skin:    General: Skin is warm.  Neurological:     Mental Status: She is alert and oriented to person, place, and time. Mental status is at baseline.  Psychiatric:        Mood and Affect: Mood normal.        Behavior: Behavior normal.        Thought Content: Thought content normal.        Judgment: Judgment normal.      LABORATORY DATA:  I have reviewed the labs as listed.  CBC    Component Value Date/Time   WBC 5.7 11/01/2018 1234   RBC 3.51 (L) 11/01/2018 1234   HGB 10.6 (L) 11/01/2018 1234   HGB 9.6 (L) 11/02/2017 0756   HCT 34.4 (L) 11/01/2018 1234   HCT 21.0 (L) 10/11/2018 0556   HCT 30.6 (L) 11/02/2017 0756   PLT 206 11/01/2018 1234   PLT 150 11/02/2017 0756   PLT 40 (LL) 11/18/2016 1642   MCV 98.0 11/01/2018 1234   MCV 108.5 (H) 11/02/2017 0756   MCH 30.2 11/01/2018 1234   MCHC 30.8 11/01/2018 1234   RDW 17.8 (H) 11/01/2018 1234   RDW 17.8 (H) 11/02/2017 0756   LYMPHSABS 0.3 (L) 11/01/2018 1234   LYMPHSABS  0.4 (L) 11/02/2017 0756   MONOABS 0.7 11/01/2018 1234   MONOABS 0.5 11/02/2017 0756   EOSABS 0.1 11/01/2018 1234   EOSABS 0.7 (H) 11/02/2017 0756   EOSABS 0.1 11/18/2016 1642   BASOSABS 0.0 11/01/2018 1234   BASOSABS 0.0 11/02/2017 0756   CMP Latest Ref Rng & Units 11/01/2018 10/22/2018 10/15/2018  Glucose 70 - 99 mg/dL 85 95 87  BUN 8 - 23 mg/dL 25(H) 17 21  Creatinine 0.44 - 1.00 mg/dL 1.43(H) 1.19(H) 1.24(H)  Sodium 135 - 145 mmol/L 138 137 137  Potassium 3.5 - 5.1 mmol/L 2.9(L) 3.2(L) 3.9  Chloride 98 - 111 mmol/L 101 100 104  CO2 22 - 32 mmol/L '26 25 25  ' Calcium 8.9 - 10.3 mg/dL 8.8(L) 9.2 9.3  Total Protein 6.5 - 8.1 g/dL 6.3(L) 6.9 6.9  Total Bilirubin 0.3 - 1.2 mg/dL 0.7 0.6 0.5  Alkaline Phos 38 - 126 U/L 70 80 87  AST 15 - 41 U/L '22 22 27  ' ALT 0 - 44 U/L '13 19 31       ' DIAGNOSTIC IMAGING:  I have independently reviewed the scans and discussed with the  patient.   I have reviewed Francene Finders, NP's note and agree with the documentation.  I personally performed a face-to-face visit, made revisions and my assessment and plan is as follows.    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 -Underwent 7 cycles of chemotherapy with carboplatin and paclitaxel from 06/01/2016 through 09/18/2016. -Germline mutation testing on 06/25/2016 was negative. - Underwent robotic assisted tumor debulking surgery on 10/14/2016 by Dr. Denman George. -3 cycles of chemotherapy with carboplatin and paclitaxel from 11/06/2016 through 12/25/2016. - Second line therapy with Doxil and bevacizumab from 05/25/2017 through 07/07/2017. - Third line therapy with gemcitabine from 07/30/2017 through December 2018, gemcitabine and cisplatin day 1 and day 8 q. 21 days from 11/02/2017, 7 cycles completed on 06/14/2018, with CT scan on 07/01/2018 showing progression. -She underwent paracentesis, with 4 L removed on 07/08/2018.  There is a subcutaneous area of skin thickening at the paracentesis site, questionable for seeding. - 3 cycles of docetaxel 75 mg/m from 07/16/2018 through 08/26/2018. - Ca1 25 levels on 07/01/2018 was 2005, 2367 on day of cycle 2, 2333 on day of cycle 3. -We discussed the results of the CT scan CAP which showed progression of her disease.  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  FDA approved therapeutic option rucaparib was suggested. - Rucaparib 600 mg twice daily started on 09/25/2018. -She had paracentesis on 09/21/2018, 7 L fluid removed. - 7 days after starting rucaparib, she started feeling very weak and nauseous.  Rucaparib was held on 10/05/2018 due to severe weakness.  -She was  admitted to the hospital on 10/08/2018 through 10/11/2018 with weakness, and renal failure. -She received 1 unit of PRBC.  CT abdomen done on 10/09/2018 showed stable omental and peritoneal disease.  She underwent paracentesis on 10/11/2018 with 4 L of fluid removed. - I have decreased rucaparib to 300 mg twice a day on 10/15/2018. -She is continuing to tolerate rucaparib very well.  Denies any diarrhea or vomiting.  Occasional nausea present. - We reviewed her blood work.  Hemoglobin has improved. - I have told her to restart taking magnesium 400 mg  twice daily as her magnesium level was low today at 1.3. - She was also told to restart back on potassium 20 mEq daily.  Her potassium today is 2.9. -We will see her back in 1 week to repeat blood work.  We will closely monitor her creatinine as it has slightly worsened.  2.  Malignant ascites: -Paracentesis on 07/08/2018, 4 L removed. -Paracentesis on 09/21/2018, 7 L removed. -Paracentesis on 10/11/2018 with 4 L removed. -She was started back on Lasix as needed..  3.  CKD: -Creatinine has increased to 1.4.  We will keep a close eye on it.  Benzapril and metformin are on hold.  She takes Lasix 20 mg twice daily.  4.  Diabetes type 2: -She is on insulin pump and her sugars are fairly well controlled.  5.  Right posterior thigh pain: -She has right lower back pain and posterior thigh pain since October 2016 from her back problems. -She is taking hydrocodone every 4 hours since the recent hospitalization.  Previously she used to take every 6 hours.  We will send a refill for it.  6.  Peripheral neuropathy: -She has on and off numbness in the toes.  No numbness in the hands reported.  She was prescribed gabapentin few months ago but she is not taking it.      Orders placed this encounter:  Orders Placed This Encounter  Procedures  . Lactate dehydrogenase  . Magnesium  . CBC with Differential/Platelet  . Comprehensive metabolic panel  . CA V[place], MD Olga [phone]

## 2018-11-08 ENCOUNTER — Other Ambulatory Visit: Payer: Self-pay

## 2018-11-08 ENCOUNTER — Inpatient Hospital Stay (HOSPITAL_COMMUNITY): Payer: Medicare Other | Attending: Hematology | Admitting: Hematology

## 2018-11-08 ENCOUNTER — Encounter (HOSPITAL_COMMUNITY): Payer: Self-pay | Admitting: Hematology

## 2018-11-08 ENCOUNTER — Inpatient Hospital Stay (HOSPITAL_COMMUNITY): Payer: Medicare Other

## 2018-11-08 VITALS — BP 137/56 | HR 99 | Temp 98.1°F | Resp 18 | Wt 210.0 lb

## 2018-11-08 DIAGNOSIS — Z9221 Personal history of antineoplastic chemotherapy: Secondary | ICD-10-CM | POA: Diagnosis not present

## 2018-11-08 DIAGNOSIS — C482 Malignant neoplasm of peritoneum, unspecified: Secondary | ICD-10-CM | POA: Diagnosis not present

## 2018-11-08 DIAGNOSIS — R180 Malignant ascites: Secondary | ICD-10-CM | POA: Diagnosis not present

## 2018-11-08 DIAGNOSIS — C569 Malignant neoplasm of unspecified ovary: Secondary | ICD-10-CM

## 2018-11-08 DIAGNOSIS — Z90710 Acquired absence of both cervix and uterus: Secondary | ICD-10-CM | POA: Diagnosis not present

## 2018-11-08 DIAGNOSIS — Z9641 Presence of insulin pump (external) (internal): Secondary | ICD-10-CM | POA: Diagnosis not present

## 2018-11-08 DIAGNOSIS — Z806 Family history of leukemia: Secondary | ICD-10-CM | POA: Diagnosis not present

## 2018-11-08 DIAGNOSIS — N189 Chronic kidney disease, unspecified: Secondary | ICD-10-CM | POA: Diagnosis not present

## 2018-11-08 DIAGNOSIS — R5383 Other fatigue: Secondary | ICD-10-CM | POA: Insufficient documentation

## 2018-11-08 DIAGNOSIS — R600 Localized edema: Secondary | ICD-10-CM

## 2018-11-08 DIAGNOSIS — Z801 Family history of malignant neoplasm of trachea, bronchus and lung: Secondary | ICD-10-CM

## 2018-11-08 DIAGNOSIS — M545 Low back pain: Secondary | ICD-10-CM

## 2018-11-08 DIAGNOSIS — R531 Weakness: Secondary | ICD-10-CM | POA: Diagnosis not present

## 2018-11-08 DIAGNOSIS — I129 Hypertensive chronic kidney disease with stage 1 through stage 4 chronic kidney disease, or unspecified chronic kidney disease: Secondary | ICD-10-CM | POA: Diagnosis not present

## 2018-11-08 DIAGNOSIS — Z90722 Acquired absence of ovaries, bilateral: Secondary | ICD-10-CM

## 2018-11-08 DIAGNOSIS — I700 Atherosclerosis of aorta: Secondary | ICD-10-CM | POA: Diagnosis not present

## 2018-11-08 DIAGNOSIS — R197 Diarrhea, unspecified: Secondary | ICD-10-CM | POA: Diagnosis not present

## 2018-11-08 DIAGNOSIS — C481 Malignant neoplasm of specified parts of peritoneum: Secondary | ICD-10-CM

## 2018-11-08 DIAGNOSIS — R0609 Other forms of dyspnea: Secondary | ICD-10-CM | POA: Diagnosis not present

## 2018-11-08 DIAGNOSIS — Z803 Family history of malignant neoplasm of breast: Secondary | ICD-10-CM

## 2018-11-08 DIAGNOSIS — G629 Polyneuropathy, unspecified: Secondary | ICD-10-CM

## 2018-11-08 DIAGNOSIS — R21 Rash and other nonspecific skin eruption: Secondary | ICD-10-CM | POA: Diagnosis not present

## 2018-11-08 DIAGNOSIS — Z79899 Other long term (current) drug therapy: Secondary | ICD-10-CM | POA: Diagnosis not present

## 2018-11-08 DIAGNOSIS — E1122 Type 2 diabetes mellitus with diabetic chronic kidney disease: Secondary | ICD-10-CM | POA: Diagnosis not present

## 2018-11-08 DIAGNOSIS — K219 Gastro-esophageal reflux disease without esophagitis: Secondary | ICD-10-CM | POA: Diagnosis not present

## 2018-11-08 LAB — COMPREHENSIVE METABOLIC PANEL
ALT: 17 U/L (ref 0–44)
AST: 31 U/L (ref 15–41)
Albumin: 3.1 g/dL — ABNORMAL LOW (ref 3.5–5.0)
Alkaline Phosphatase: 69 U/L (ref 38–126)
Anion gap: 9 (ref 5–15)
BUN: 38 mg/dL — ABNORMAL HIGH (ref 8–23)
CHLORIDE: 101 mmol/L (ref 98–111)
CO2: 26 mmol/L (ref 22–32)
Calcium: 9.1 mg/dL (ref 8.9–10.3)
Creatinine, Ser: 2.02 mg/dL — ABNORMAL HIGH (ref 0.44–1.00)
GFR calc Af Amer: 29 mL/min — ABNORMAL LOW (ref >60)
GFR calc non Af Amer: 25 mL/min — ABNORMAL LOW (ref >60)
Glucose, Bld: 125 mg/dL — ABNORMAL HIGH (ref 70–99)
Potassium: 4.6 mmol/L (ref 3.5–5.1)
Sodium: 136 mmol/L (ref 135–145)
Total Bilirubin: 0.6 mg/dL (ref 0.3–1.2)
Total Protein: 6.1 g/dL — ABNORMAL LOW (ref 6.5–8.1)

## 2018-11-08 LAB — MAGNESIUM: Magnesium: 2.2 mg/dL (ref 1.7–2.4)

## 2018-11-08 LAB — CBC WITH DIFFERENTIAL/PLATELET
Abs Immature Granulocytes: 0.03 10*3/uL (ref 0.00–0.07)
Basophils Absolute: 0 10*3/uL (ref 0.0–0.1)
Basophils Relative: 0 %
Eosinophils Absolute: 0.1 10*3/uL (ref 0.0–0.5)
Eosinophils Relative: 2 %
HCT: 33.2 % — ABNORMAL LOW (ref 36.0–46.0)
Hemoglobin: 10.2 g/dL — ABNORMAL LOW (ref 12.0–15.0)
IMMATURE GRANULOCYTES: 0 %
Lymphocytes Relative: 5 %
Lymphs Abs: 0.4 10*3/uL — ABNORMAL LOW (ref 0.7–4.0)
MCH: 31 pg (ref 26.0–34.0)
MCHC: 30.7 g/dL (ref 30.0–36.0)
MCV: 100.9 fL — ABNORMAL HIGH (ref 80.0–100.0)
Monocytes Absolute: 0.6 10*3/uL (ref 0.1–1.0)
Monocytes Relative: 7 %
NEUTROS PCT: 86 %
Neutro Abs: 6.9 10*3/uL (ref 1.7–7.7)
Platelets: 201 10*3/uL (ref 150–400)
RBC: 3.29 MIL/uL — ABNORMAL LOW (ref 3.87–5.11)
RDW: 17.7 % — ABNORMAL HIGH (ref 11.5–15.5)
WBC: 8.1 10*3/uL (ref 4.0–10.5)
nRBC: 0 % (ref 0.0–0.2)

## 2018-11-08 LAB — LACTATE DEHYDROGENASE: LDH: 179 U/L (ref 98–192)

## 2018-11-08 NOTE — Assessment & Plan Note (Signed)
1.  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 -Underwent 7 cycles of chemotherapy with carboplatin and paclitaxel from 06/01/2016 through 09/18/2016. -Germline mutation testing on 06/25/2016 was negative. - Underwent robotic assisted tumor debulking surgery on 10/14/2016 by Dr. Denman George. -3 cycles of chemotherapy with carboplatin and paclitaxel from 11/06/2016 through 12/25/2016. - Second line therapy with Doxil and bevacizumab from 05/25/2017 through 07/07/2017. - Third line therapy with gemcitabine from 07/30/2017 through December 2018, gemcitabine and cisplatin day 1 and day 8 q. 21 days from 11/02/2017, 7 cycles completed on 06/14/2018, with CT scan on 07/01/2018 showing progression. -She underwent paracentesis, with 4 L removed on 07/08/2018.  There is a subcutaneous area of skin thickening at the paracentesis site, questionable for seeding. - 3 cycles of docetaxel 75 mg/m from 07/16/2018 through 08/26/2018. - Ca1 25 levels on 07/01/2018 was 2005, 2367 on day of cycle 2, 2333 on day of cycle 3. -We discussed the results of the CT scan CAP which showed progression of her disease.  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  FDA approved therapeutic option rucaparib was suggested. - Rucaparib 600 mg twice daily started on 09/25/2018. -She had paracentesis on 09/21/2018, 7 L fluid removed. - 7 days after starting rucaparib, she started feeling very weak and nauseous.  Rucaparib was held on 10/05/2018 due to severe weakness.  -She was admitted to the hospital on 10/08/2018 through 10/11/2018 with weakness, and renal failure. -CT abdomen on 10/09/2018 showed stable omental and peritoneal disease. - I have decreased rucaparib to 300 mg twice a day on 10/15/2018. -She is continuing to tolerate rucaparib 300 mg  twice daily very well. - Her magnesium is 2.2 today.  Hence I would cut back on magnesium to 400 mg once a day. -Her potassium is also towards the upper limit of normal.  I have told her to cut back on potassium to half tablet daily. -Her creatinine went up to 2.0.  We will cut back on rucaparib to 300 mg once a day until next week.  We will plan to repeat blood work next week.  2.  Malignant ascites: -Paracentesis on 07/08/2018, 4 L removed. -Paracentesis on 09/21/2018, 7 L removed. -Paracentesis on 10/11/2018 with 4 L removed. -She will continue Lasix.  Will arrange her for another paracentesis as her abdomen is swollen.  3.  CKD: - Creatinine has increased to 2.04.  Benazepril and metformin are on hold.  She is taking Lasix 20 twice daily.  I have told her to cut back on Lasix to 20 mg once a day.  I have also decreased rucaparib to 300 mg once a day.  We will reevaluate her in 1 week.  4.  Diabetes type 2: -She is on insulin pump and her sugars are fairly well controlled.  5.  Right posterior thigh pain: -She has right lower back pain and posterior thigh pain since October 2016 from her back problems. -She is taking hydrocodone every 4 hours since the recent hospitalization.  Previously she used to take every 6 hours.  We will send a refill for it.  6.  Peripheral neuropathy: -She has on and off numbness in the toes.  No numbness in the hands reported.  She was prescribed gabapentin few months ago but she is not taking it.

## 2018-11-08 NOTE — Patient Instructions (Addendum)
[employer] at [facility] Discharge Instructions   Take you Rubraca once a day in the morning. Take magnesium once a day Take lasix 20mg  once in the morning and only AS NEEDED in the evenings.  Take potassium 1/2 tab once a daily.   Thank you for choosing [place] at [facility] to provide your oncology and hematology care.  To afford each patient quality time with our provider, please arrive at least 15 minutes before your scheduled appointment time.   If you have a lab appointment with the [facility] please come in thru the  Main Entrance and check in at the main information desk  You need to re-schedule your appointment should you arrive 10 or more minutes late.  We strive to give you quality time with our providers, and arriving late affects you and other patients whose appointments are after yours.  Also, if you no show three or more times for appointments you may be dismissed from the clinic at the providers discretion.     Again, thank you for choosing [facility].  Our hope is that these requests will decrease the amount of time that you wait before being seen by our physicians.       _____________________________________________________________  Should you have questions after your visit to [facility], please contact our office at [phone] between the hours of 8:00 a.m. and 4:30 p.m.  Voicemails left after 4:00 p.m. will not be returned until the following business day.  For prescription refill requests, have your pharmacy contact our office and allow 72 hours.    Cancer Center Support Programs:   > Cancer Support Group  2nd Tuesday of the month 1pm-2pm, Journey Room

## 2018-11-08 NOTE — Progress Notes (Signed)
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 This may potentially be ovarian or a GI in etiology.    04/30/2016 Tumor Marker    CA 125- 7149.0 (H)    05/01/2016 Procedure    US paracentesis- Successful ultrasound-guided paracentesis yielding 1.8 liters of peritoneal fluid.    05/01/2016 Imaging    US pelvis- Both transabdominal and transvaginal sonography are significantly limited by large patient habitus and ascites. Neither uterus or ovaries were visualized on this exam.    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    05/08/2016 Imaging    CT chest- No evidence of metastatic disease in the chest. Peritoneal/omental disease with abdominal ascites in the upper abdomen, incompletely visualized.     05/13/2016 Procedure    Placement of single lumen port a cath via right internal jugular vein. The catheter tip lies at the cavoatrial junction. A power injectable port a cath was placed and is ready for immediate use.    05/15/2016 Procedure    US Paracentesis- 3400 ml yellow colored ascites removed    05/15/2016 - 09/18/2016 Chemotherapy    Carboplatin/Paclitaxel every 21 days x 7 cycles    07/01/2016 Miscellaneous    Genetic Counseling by [name]-  Genetic testing was normal, and did not reveal a deleterious mutation in these genes.     07/08/2016 Imaging    CT CAP- 1. Small volume ascites, significantly decreased. 2. Stable diffuse omental soft tissue caking and diffuse peritoneal thickening along the bilateral paracolic gutters and bilateral pelvic peritoneal reflections, consistent with peritoneal carcinomatosis. 3. Stable asymmetrically enlarged right ovary, which may represent the primary site of ovarian malignancy. 4. No evidence of metastatic disease in the chest. No new sites of metastatic disease in the abdomen or pelvis.    07/09/2016 Miscellaneous    Gyn Onc re-evaluation- modest response to therapy, 3 more cycles of chemotherapy recommended.      09/11/2016 Imaging    CT C/A/P No significant change omental soft tissue caking, consistent with metastatic disease. Mild ascites is decreased since previous study.  Increased calcification along peritoneal surface in pelvic cul-de-sac, consistent with treated peritoneal metastatic disease.  Stable 4.5cm homogeneous right pelvic mass, which favors a uterine fibroid although right ovarian neoplasm cannot definitely be excluded.  No new or progressive metastatic disease identified. No evidence of metastatic disease within the thorax.     10/14/2016 Procedure    Robotic-assisted laparoscopic total hysterectomy with bilateral salpingoophorectomy, ex lap omentectomy, radical tumor debulking by Dr. Denman George    10/17/2016 Pathology Results    Diagnosis 1. 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 - CERVIX AND ENDOMETRIUM FREE OF TUMOR. - SEE ONCOLOGY TABLE AND COMMENT. 2. Soft  tissue, biopsy, umbilical nodule - HIGH GRADE SEROUS CARCINOMA. 3. Omentum, resection for tumor - HIGH GRADE SEROUS CARCINOMA, 33 CM.    11/06/2016 - 12/25/2016  Chemotherapy    Carboplatin/Paclitaxel x 3 cycles     01/12/2017 Imaging    CT CAP- 1. Interval hysterectomy, bilateral salpingo-oophorectomy and omentectomy without evidence of tumor recurrence. 2. No evidence of metastatic disease. 3. 5 mm nonobstructing lower pole left renal calculus.    01/13/2017 Remission    No evidence of residual disease on CT imaging.    05/04/2017 Imaging    CT CAP- New small amount of ascites within the abdomen and pelvis since 01/12/2017 which could indicate disease progression but no new identifiable tumor and no significant change in omental and mild peritoneal thickening.  No evidence of metastatic disease within the chest.  Coronary artery disease.  Aortic Atherosclerosis (ICD10-I70.0).    05/04/2017 Tumor Marker    Patient's tumor was tested for the following markers: CA 125 Results of the tumor marker test revealed 8144    05/18/2017 Imaging    ECHO; EF 60% -  65    05/25/2017 - 07/07/2017 Chemotherapy    She received Doxil and Avastin. Treatment is stopped due to disease progression    06/01/2017 Procedure    Successful ultrasound-guided therapeutic paracentesis yielding 2.8 liters of peritoneal fluid    06/22/2017 Tumor Marker    Patient's tumor was tested for the following markers: CA 125 Results of the tumor marker test revealed 13440    07/20/2017 Imaging    Mild increase in peritoneal carcinoma within abdomen pelvis since previous study. No significant change and minimal ascites.  No evidence of metastatic disease within the thorax. New mild airspace opacity in left lower lobe, consistent with inflammatory or infectious etiology.    07/30/2017 Tumor Marker    Patient's tumor was tested for the following markers: CA 125 Results of the tumor marker test revealed 12099    07/30/2017 - 06/14/2018 Chemotherapy    The patient had gemzar. Cisplatin is added on 11/02/18    08/03/2017 - 08/05/2017 Hospital Admission    She was admitted to the  hospital for management of UTI and neutropenic fever    08/13/2017 Adverse Reaction    Dose of chemotherapy is reduced due to neutropenic sepsis    08/24/2017 Tumor Marker    Patient's tumor was tested for the following markers: CA 125 Results of the tumor marker test revealed 7431    09/21/2017 Tumor Marker    Patient's tumor was tested for the following markers: CA 125 Results of the tumor marker test revealed 4176    10/09/2017 Imaging    No significant change in peritoneal carcinomatosis since previous study.  No new or progressive metastatic disease within the abdomen or pelvis.  Stable tiny nonobstructive left renal calculus. No evidence of ureteral calculi or hydronephrosis.    11/10/2017 Tumor Marker    Patient's tumor was tested for the following markers: CA 125 Results of the tumor marker test revealed 4257    11/24/2017 Tumor Marker    Patient's tumor was tested for the following markers: CA 125 Results of the tumor marker test revealed 3377    01/12/2018 Tumor Marker    Patient's tumor was tested for the following markers: CA 125 Results of the tumor marker test revealed 1595    01/21/2018 Imaging    1. Stable to slightly improved omental, gastrohepatic ligament and pelvic disease as detailed above. No new/progressive findings. 2. No acute  abdominal or pelvic findings. 3. Stable lower pole left renal calculus.    02/08/2018 Tumor Marker    Patient's tumor was tested for the following markers: CA 125 Results of the tumor marker test revealed 1058    03/08/2018 Tumor Marker    Patient's tumor was tested for the following markers: CA-125 Results of the tumor marker test revealed 737.1    04/02/2018 Tumor Marker    Patient's tumor was tested for the following markers: CA-125 Results of the tumor marker test revealed 542.8    06/14/2018 Tumor Marker    CA 125- 1010    06/30/2018 Imaging    1. Mixed appearance but overall progression, with the left upper quadrant omental  tumor slightly improved but with some potential new deposition of tumor along the hepatic flexure margin. There is also new moderate ascites which must be considered suspicious for malignant ascites/peritoneal spread. 2. Other imaging findings of potential clinical significance: Nonobstructive left nephrolithiasis. Aortic Atherosclerosis (ICD10-I70.0). Mild subcutaneous edema along the sixth anterior abdominal wall potentially from mild panniculitis.    07/08/2018 Procedure    Successful ultrasound-guided paracentesis yielding 4 liters of peritoneal fluid.    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 Reh 68 y.o. female returns for routine follow-up for high-grade serous carcinoma of the peritoneum. She is still have some lower leg edema. She feels she needs another paracentesis she is starting to feel full and her appetite is decreasing again. Denies any nausea, vomiting, or diarrhea. Denies any new pains. Had not noticed any recent bleeding such as epistaxis, hematuria or hematochezia. Denies recent chest pain on exertion, shortness of breath on minimal exertion, pre-syncopal episodes, or palpitations. Denies any numbness or tingling in hands or feet. Denies any recent fevers, infections, or recent hospitalizations. She reports her appetite at 75% and energy level at 50%.     REVIEW OF SYSTEMS:  Review of Systems  Skin: Positive for rash.  All other systems reviewed and are negative.    PAST MEDICAL/SURGICAL HISTORY:  Past Medical History:  Diagnosis Date  . Diabetes mellitus without  complication (HCC)    on insulin pump  . Dysrhythmia   . 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 Family history of breast cancer   . GERD (gastroesophageal reflux disease)   . History of blood transfusion   . History of bronchitis   . History of chemotherapy   . History of urinary tract infection   . Hyperlipidemia   . Hypertension   . Low serum vitamin D   . Ovarian cancer ([place]) 05/09/2016  . Shingles    Past Surgical History:  Procedure Laterality Date  . ABDOMINAL HYSTERECTOMY  10/14/2016  . CESAREAN SECTION    . DEBULKING N/A 10/14/2016   Procedure: DEBULKING;  Surgeon: Everitt Amber, MD;  Location: [facility];  Service: Gynecology;  Laterality: N/A;  . IR PARACENTESIS  06/01/2017  . LAPAROTOMY WITH STAGING N/A 10/14/2016   Procedure: LAPAROTOMY WITH OMENTECTOMY AND TUMOR DEBULGING;  Surgeon: Everitt Amber, MD;  Location: [facility];  Service: Gynecology;  Laterality: N/A;  .  LUMBAR FUSION  08/21/15   L3-L4 Dr. Timmothy Euler  . OMENTECTOMY N/A 10/14/2016   Procedure: OMENTECTOMY;  Surgeon: Everitt Amber, MD;  Location: [facility];  Service: Gynecology;  Laterality: N/A;  . ROBOTIC ASSISTED TOTAL HYSTERECTOMY WITH BILATERAL SALPINGO OOPHERECTOMY Bilateral 10/14/2016   Procedure: XI ROBOTIC ASSISTED TOTAL LAPARSCOPIC  HYSTERECTOMY WITH BILATERAL SALPINGO OOPHORECTOMY;  Surgeon: Everitt Amber, MD;  Location: [facility];  Service: Gynecology;  Laterality: Bilateral;     SOCIAL HISTORY:  Social History   Socioeconomic History  . Marital status: Married    Spouse name: Gershon Mussel  . Number of children: 2  . Years of education: Not on file  . Highest education level: Not on file  Occupational History  . Occupation: retired  Scientific laboratory technician  . Financial resource strain: Not on file  . Food insecurity:    Worry: Not on file    Inability: Not on file  . Transportation needs:    Medical: Not on file    Non-medical: Not on file  Tobacco Use  . Smoking status: Never Smoker  . Smokeless  tobacco: Never Used  Substance and Sexual Activity  . Alcohol use: No  . Drug use: No  . Sexual activity: Not on file    Comment: married  Lifestyle  . Physical activity:    Days per week: Not on file    Minutes per session: Not on file  . Stress: Not on file  Relationships  . Social connections:    Talks on phone: Not on file    Gets together: Not on file    Attends religious service: Not on file    Active member of club or organization: Not on file    Attends meetings of clubs or organizations: Not on file    Relationship status: Not on file  . Intimate partner violence:    Fear of current or ex partner: Not on file    Emotionally abused: Not on file    Physically abused: Not on file    Forced sexual activity: Not on file  Other Topics Concern  . Not on file  Social History Narrative  . Not on file    FAMILY HISTORY:  Family History  Problem Relation Age of Onset  . Lung cancer Mother        smoker; dx in her 87s  . Leukemia Father   . Diabetes Paternal Grandmother   . Heart attack Paternal Grandmother   . Diabetes Paternal Grandfather   . Breast cancer Paternal Aunt        dx in her 2s-30s  . Leukemia Paternal Uncle   . Heart attack Maternal Grandfather   . Breast cancer Cousin        maternal first cousin    CURRENT MEDICATIONS:  Outpatient Encounter Medications as of 11/08/2018  Medication Sig  . atorvastatin (LIPITOR) 40 MG tablet Take 1 tablet (40 mg total) by mouth daily.  . Biotin 1000 MCG tablet Take 1,000 mcg by mouth 3 (three) times daily.  . furosemide (LASIX) 20 MG tablet Take 1 tablet (20 mg total) by mouth 2 (two) times daily.  Marland Kitchen glucose blood (ONE TOUCH ULTRA TEST) test strip Use to test blood sugar 6-8 times daily  . HYDROcodone-acetaminophen (NORCO) 10-325 MG tablet Take 1 tablet by mouth every 4 (four) hours as needed.  . insulin lispro (HUMALOG) 100 UNIT/ML injection USE IN INSULIN PUMP AS DIRECTED. MAX DAILY DOSE OF 110 UNITS PER DAY.  (Patient taking differently: Inject  110 Units into the skin See admin instructions. USE IN INSULIN PUMP AS DIRECTED. MAX DAILY DOSE OF 110 UNITS PER DAY.)  . lidocaine-prilocaine (EMLA) cream Apply a quarter size amount to port site 1 hour prior to chemo. Do not rub in. Cover with plastic wrap.  . Multiple Vitamin (MULTIVITAMIN WITH MINERALS) TABS Take 1 tablet by mouth daily.  Marland Kitchen omeprazole (PRILOSEC) 40 MG capsule Take 1 capsule (40 mg total) by mouth daily.  . polyethylene glycol (MIRALAX / GLYCOLAX) packet Take 17 g by mouth daily as needed for mild constipation.   . potassium chloride SA (K-DUR,KLOR-CON) 20 MEQ tablet Take 1 tablet (20 mEq total) by mouth daily.  . prochlorperazine (COMPAZINE) 10 MG tablet Take 1 tablet (10 mg total) by mouth every 6 (six) hours as needed for nausea or vomiting.  . rucaparib camsylate (RUBRACA) 300 MG tablet Take 1 tablet (300 mg total) by mouth 2 (two) times daily.  . vitamin B-12 (CYANOCOBALAMIN) 1000 MCG tablet Take 1,000 mcg by mouth daily.  . vitamin E (VITAMIN E) 400 UNIT capsule Take 400 Units by mouth daily.  . [DISCONTINUED] furosemide (LASIX) 40 MG tablet Take 1 tablet (40 mg total) by mouth 2 (two) times daily. (Patient taking differently: Take 40 mg by mouth daily. )   No facility-administered encounter medications on file as of 11/08/2018.     ALLERGIES:  Allergies  Allergen Reactions  . Avandia [Rosiglitazone] Other (See Comments)    Legs swelled  . Micronase [Glyburide] Swelling  . Actos [Pioglitazone] Other (See Comments)    Edema / leg swelling     PHYSICAL EXAM:  ECOG Performance status: 1  Vitals:   11/08/18 1000  BP: (!) 137/56  Pulse: 99  Resp: 18  Temp: 98.1 F (36.7 C)  SpO2: 96%   Filed Weights   11/08/18 1000  Weight: 210 lb (95.3 kg)    Physical Exam Constitutional:      Appearance: Normal appearance. She is normal weight.  Musculoskeletal:     Left lower leg: Edema present.  Skin:    General: Skin is warm  and dry.  Neurological:     Mental Status: She is alert and oriented to person, place, and time. Mental status is at baseline.  Psychiatric:        Mood and Affect: Mood normal.        Behavior: Behavior normal.        Thought Content: Thought content normal.        Judgment: Judgment normal.      LABORATORY DATA:  I have reviewed the labs as listed.  CBC    Component Value Date/Time   WBC 8.1 11/08/2018 0947   RBC 3.29 (L) 11/08/2018 0947   HGB 10.2 (L) 11/08/2018 0947   HGB 9.6 (L) 11/02/2017 0756   HCT 33.2 (L) 11/08/2018 0947   HCT 21.0 (L) 10/11/2018 0556   HCT 30.6 (L) 11/02/2017 0756   PLT 201 11/08/2018 0947   PLT 150 11/02/2017 0756   PLT 40 (LL) 11/18/2016 1642   MCV 100.9 (H) 11/08/2018 0947   MCV 108.5 (H) 11/02/2017 0756   MCH 31.0 11/08/2018 0947   MCHC 30.7 11/08/2018 0947   RDW 17.7 (H) 11/08/2018 0947   RDW 17.8 (H) 11/02/2017 0756   LYMPHSABS 0.4 (L) 11/08/2018 0947   LYMPHSABS 0.4 (L) 11/02/2017 0756   MONOABS 0.6 11/08/2018 0947   MONOABS 0.5 11/02/2017 0756   EOSABS 0.1 11/08/2018 0947   EOSABS 0.7 (H) 11/02/2017  0756   EOSABS 0.1 11/18/2016 1642   BASOSABS 0.0 11/08/2018 0947   BASOSABS 0.0 11/02/2017 0756   CMP Latest Ref Rng & Units 11/08/2018 11/01/2018 10/22/2018  Glucose 70 - 99 mg/dL 125(H) 85 95  BUN 8 - 23 mg/dL 38(H) 25(H) 17  Creatinine 0.44 - 1.00 mg/dL 2.02(H) 1.43(H) 1.19(H)  Sodium 135 - 145 mmol/L 136 138 137  Potassium 3.5 - 5.1 mmol/L 4.6 2.9(L) 3.2(L)  Chloride 98 - 111 mmol/L 101 101 100  CO2 22 - 32 mmol/L _0 Calcium 8.9 - 10.3 mg/dL 9.1 8.8(L) 9.2  Total Protein 6.5 - 8.1 g/dL 6.1(L) 6.3(L) 6.9  Total Bilirubin 0.3 - 1.2 mg/dL 0.6 0.7 0.6  Alkaline Phos 38 - 126 U/L 69 70 80  AST 15 - 41 U/L _1 ALT 0 - 44 U/L _2 DIAGNOSTIC IMAGING:  I have independently reviewed the scans and discussed with the patient.   I have reviewed Francene Finders, NP's note and agree with the documentation.  I  personally performed a face-to-face visit, made revisions and my assessment and plan is as follows.    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 -Underwent 7 cycles of chemotherapy with carboplatin and paclitaxel from 06/01/2016 through 09/18/2016. -Germline mutation testing on 06/25/2016 was negative. - Underwent robotic assisted tumor debulking surgery on 10/14/2016 by Dr. Denman George. -3 cycles of chemotherapy with carboplatin and paclitaxel from 11/06/2016 through 12/25/2016. - Second line therapy with Doxil and bevacizumab from 05/25/2017 through 07/07/2017. - Third line therapy with gemcitabine from 07/30/2017 through December 2018, gemcitabine and cisplatin day 1 and day 8 q. 21 days from 11/02/2017, 7 cycles completed on 06/14/2018, with CT scan on 07/01/2018 showing progression. -She underwent paracentesis, with 4 L removed on 07/08/2018.  There is a subcutaneous area of skin thickening at the paracentesis site, questionable for seeding. - 3 cycles of docetaxel 75 mg/m from 07/16/2018 through 08/26/2018. - Ca1 25 levels on 07/01/2018 was 2005, 2367 on day of cycle 2, 2333 on day of cycle 3. -We discussed the results of the CT scan CAP which showed progression of her disease.  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  FDA approved therapeutic option rucaparib was suggested. - Rucaparib 600 mg twice daily started on 09/25/2018. -She had paracentesis on 09/21/2018, 7 L fluid removed. - 7 days after starting rucaparib, she started feeling very weak and nauseous.  Rucaparib was held on 10/05/2018 due to severe weakness.  -She was admitted to the hospital on 10/08/2018 through 10/11/2018 with weakness, and renal failure. -CT  abdomen on 10/09/2018 showed stable omental and peritoneal disease. - I have decreased rucaparib to 300 mg twice a day on 10/15/2018. -She is continuing to tolerate rucaparib 300 mg twice daily very well. - Her magnesium is 2.2 today.  Hence I would cut back on magnesium to 400 mg once a day. -Her potassium is also towards the upper limit of normal.  I have told her to cut back on potassium to half tablet daily. -Her creatinine went up to 2.0.  We will cut back on rucaparib to 300 mg once a day until next week.  We will plan to repeat blood work next  week.  2.  Malignant ascites: -Paracentesis on 07/08/2018, 4 L removed. -Paracentesis on 09/21/2018, 7 L removed. -Paracentesis on 10/11/2018 with 4 L removed. -She will continue Lasix.  Will arrange her for another paracentesis as her abdomen is swollen.  3.  CKD: - Creatinine has increased to 2.04.  Benazepril and metformin are on hold.  She is taking Lasix 20 twice daily.  I have told her to cut back on Lasix to 20 mg once a day.  I have also decreased rucaparib to 300 mg once a day.  We will reevaluate her in 1 week.  4.  Diabetes type 2: -She is on insulin pump and her sugars are fairly well controlled.  5.  Right posterior thigh pain: -She has right lower back pain and posterior thigh pain since October 2016 from her back problems. -She is taking hydrocodone every 4 hours since the recent hospitalization.  Previously she used to take every 6 hours.  We will send a refill for it.  6.  Peripheral neuropathy: -She has on and off numbness in the toes.  No numbness in the hands reported.  She was prescribed gabapentin few months ago but she is not taking it.      Orders placed this encounter:  Orders Placed This Encounter  Procedures  . US Paracentesis  . CA 125  . Lactate dehydrogenase  . Magnesium  . CBC with Differential/Platelet  . Comprehensive metabolic panel      Derek Jack, MD [place] [phone]

## 2018-11-09 LAB — CA 125: Cancer Antigen (CA) 125: 1966 U/mL — ABNORMAL HIGH (ref 0.0–38.1)

## 2018-11-11 ENCOUNTER — Ambulatory Visit (HOSPITAL_COMMUNITY)
Admission: RE | Admit: 2018-11-11 | Discharge: 2018-11-11 | Disposition: A | Discharge status: Home / Self Care | Payer: Medicare Other | Source: Ambulatory Visit | Attending: Nurse Practitioner | Admitting: Nurse Practitioner

## 2018-11-11 ENCOUNTER — Encounter (HOSPITAL_COMMUNITY): Payer: Self-pay

## 2018-11-11 DIAGNOSIS — C569 Malignant neoplasm of unspecified ovary: Secondary | ICD-10-CM | POA: Insufficient documentation

## 2018-11-11 NOTE — Procedures (Signed)
   US guided RLQ paracentesis  5 L yellow fluid (limit per pt)  Tolerated well

## 2018-11-11 NOTE — Progress Notes (Signed)
Paracentesis complete no signs of distress.  

## 2018-11-15 ENCOUNTER — Other Ambulatory Visit (HOSPITAL_COMMUNITY): Payer: Self-pay | Admitting: Nurse Practitioner

## 2018-11-15 DIAGNOSIS — C569 Malignant neoplasm of unspecified ovary: Secondary | ICD-10-CM

## 2018-11-16 ENCOUNTER — Encounter (HOSPITAL_COMMUNITY): Payer: Self-pay | Admitting: Hematology

## 2018-11-16 ENCOUNTER — Inpatient Hospital Stay (HOSPITAL_COMMUNITY): Payer: Medicare Other

## 2018-11-16 ENCOUNTER — Other Ambulatory Visit: Payer: Self-pay

## 2018-11-16 ENCOUNTER — Inpatient Hospital Stay (HOSPITAL_COMMUNITY): Payer: Medicare Other | Admitting: Hematology

## 2018-11-16 DIAGNOSIS — Z801 Family history of malignant neoplasm of trachea, bronchus and lung: Secondary | ICD-10-CM

## 2018-11-16 DIAGNOSIS — Z79899 Other long term (current) drug therapy: Secondary | ICD-10-CM

## 2018-11-16 DIAGNOSIS — R180 Malignant ascites: Secondary | ICD-10-CM | POA: Diagnosis not present

## 2018-11-16 DIAGNOSIS — C481 Malignant neoplasm of specified parts of peritoneum: Secondary | ICD-10-CM

## 2018-11-16 DIAGNOSIS — C569 Malignant neoplasm of unspecified ovary: Secondary | ICD-10-CM

## 2018-11-16 DIAGNOSIS — E1122 Type 2 diabetes mellitus with diabetic chronic kidney disease: Secondary | ICD-10-CM

## 2018-11-16 DIAGNOSIS — Z90710 Acquired absence of both cervix and uterus: Secondary | ICD-10-CM

## 2018-11-16 DIAGNOSIS — I700 Atherosclerosis of aorta: Secondary | ICD-10-CM

## 2018-11-16 DIAGNOSIS — Z9221 Personal history of antineoplastic chemotherapy: Secondary | ICD-10-CM

## 2018-11-16 DIAGNOSIS — Z9641 Presence of insulin pump (external) (internal): Secondary | ICD-10-CM

## 2018-11-16 DIAGNOSIS — R600 Localized edema: Secondary | ICD-10-CM

## 2018-11-16 DIAGNOSIS — N189 Chronic kidney disease, unspecified: Secondary | ICD-10-CM

## 2018-11-16 DIAGNOSIS — C482 Malignant neoplasm of peritoneum, unspecified: Secondary | ICD-10-CM | POA: Diagnosis not present

## 2018-11-16 DIAGNOSIS — R5383 Other fatigue: Secondary | ICD-10-CM

## 2018-11-16 DIAGNOSIS — Z830 Family history of human immunodeficiency virus [HIV] disease: Secondary | ICD-10-CM

## 2018-11-16 DIAGNOSIS — R0609 Other forms of dyspnea: Secondary | ICD-10-CM

## 2018-11-16 DIAGNOSIS — K219 Gastro-esophageal reflux disease without esophagitis: Secondary | ICD-10-CM

## 2018-11-16 DIAGNOSIS — Z806 Family history of leukemia: Secondary | ICD-10-CM

## 2018-11-16 DIAGNOSIS — Z90722 Acquired absence of ovaries, bilateral: Secondary | ICD-10-CM

## 2018-11-16 DIAGNOSIS — G629 Polyneuropathy, unspecified: Secondary | ICD-10-CM

## 2018-11-16 DIAGNOSIS — I129 Hypertensive chronic kidney disease with stage 1 through stage 4 chronic kidney disease, or unspecified chronic kidney disease: Secondary | ICD-10-CM

## 2018-11-16 LAB — CBC WITH DIFFERENTIAL/PLATELET
Abs Immature Granulocytes: 0.02 10*3/uL (ref 0.00–0.07)
Basophils Absolute: 0 10*3/uL (ref 0.0–0.1)
Basophils Relative: 1 %
Eosinophils Absolute: 0.1 10*3/uL (ref 0.0–0.5)
Eosinophils Relative: 3 %
HCT: 31.8 % — ABNORMAL LOW (ref 36.0–46.0)
Hemoglobin: 9.9 g/dL — ABNORMAL LOW (ref 12.0–15.0)
Immature Granulocytes: 0 %
Lymphocytes Relative: 6 %
Lymphs Abs: 0.3 10*3/uL — ABNORMAL LOW (ref 0.7–4.0)
MCH: 30.9 pg (ref 26.0–34.0)
MCHC: 31.1 g/dL (ref 30.0–36.0)
MCV: 99.4 fL (ref 80.0–100.0)
MONO ABS: 0.4 10*3/uL (ref 0.1–1.0)
Monocytes Relative: 8 %
Neutro Abs: 4.4 10*3/uL (ref 1.7–7.7)
Neutrophils Relative %: 82 %
Platelets: 150 10*3/uL (ref 150–400)
RBC: 3.2 MIL/uL — ABNORMAL LOW (ref 3.87–5.11)
RDW: 17.1 % — ABNORMAL HIGH (ref 11.5–15.5)
WBC: 5.4 10*3/uL (ref 4.0–10.5)
nRBC: 0 % (ref 0.0–0.2)

## 2018-11-16 LAB — COMPREHENSIVE METABOLIC PANEL
ALK PHOS: 60 U/L (ref 38–126)
ALT: 16 U/L (ref 0–44)
AST: 22 U/L (ref 15–41)
Albumin: 3 g/dL — ABNORMAL LOW (ref 3.5–5.0)
Anion gap: 9 (ref 5–15)
BUN: 17 mg/dL (ref 8–23)
CO2: 24 mmol/L (ref 22–32)
Calcium: 8.7 mg/dL — ABNORMAL LOW (ref 8.9–10.3)
Chloride: 102 mmol/L (ref 98–111)
Creatinine, Ser: 1.19 mg/dL — ABNORMAL HIGH (ref 0.44–1.00)
GFR calc Af Amer: 55 mL/min — ABNORMAL LOW (ref >60)
GFR calc non Af Amer: 48 mL/min — ABNORMAL LOW (ref >60)
Glucose, Bld: 122 mg/dL — ABNORMAL HIGH (ref 70–99)
Potassium: 3.9 mmol/L (ref 3.5–5.1)
Sodium: 135 mmol/L (ref 135–145)
Total Bilirubin: 0.6 mg/dL (ref 0.3–1.2)
Total Protein: 5.8 g/dL — ABNORMAL LOW (ref 6.5–8.1)

## 2018-11-16 LAB — LACTATE DEHYDROGENASE: LDH: 153 U/L (ref 98–192)

## 2018-11-16 LAB — MAGNESIUM: MAGNESIUM: 1.5 mg/dL — AB (ref 1.7–2.4)

## 2018-11-16 MED ORDER — HYDROCODONE-ACETAMINOPHEN 10-325 MG PO TABS: 1.0000 | ORAL_TABLET | ORAL | 0 refills | Status: DC | PRN

## 2018-11-16 NOTE — Progress Notes (Signed)
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 This may potentially be ovarian or a GI in etiology.    04/30/2016 Tumor Marker    CA 125- 7149.0 (H)    05/01/2016 Procedure    US paracentesis- Successful ultrasound-guided paracentesis yielding 1.8 liters of peritoneal fluid.    05/01/2016 Imaging    US pelvis- Both transabdominal and transvaginal sonography are significantly limited by large patient habitus and ascites. Neither uterus or ovaries were visualized on this exam.    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    05/08/2016 Imaging    CT chest- No evidence of metastatic disease in the chest. Peritoneal/omental disease with abdominal ascites in the upper abdomen, incompletely visualized.     05/13/2016 Procedure    Placement of single lumen port a cath via right internal jugular vein. The catheter tip lies at the cavoatrial junction. A power injectable port a cath was placed and is ready for immediate use.    05/15/2016 Procedure    US Paracentesis- 3400 ml yellow colored ascites removed    05/15/2016 - 09/18/2016 Chemotherapy    Carboplatin/Paclitaxel every 21 days x 7 cycles    07/01/2016 Miscellaneous    Genetic Counseling by [name]-  Genetic testing was normal, and did not reveal a deleterious mutation in these genes.     07/08/2016 Imaging    CT CAP- 1. Small volume ascites, significantly decreased. 2. Stable diffuse omental soft tissue caking and diffuse peritoneal thickening along the bilateral paracolic gutters and bilateral pelvic peritoneal reflections, consistent with peritoneal carcinomatosis. 3. Stable asymmetrically enlarged right ovary, which may represent the primary site of ovarian malignancy. 4. No evidence of metastatic disease in the chest. No new sites of metastatic disease in the abdomen or pelvis.    07/09/2016 Miscellaneous    Gyn Onc re-evaluation- modest response to therapy, 3 more cycles of chemotherapy recommended.      09/11/2016 Imaging    CT C/A/P No significant change omental soft tissue caking, consistent with metastatic disease. Mild ascites is decreased since previous study.  Increased calcification along peritoneal surface in pelvic cul-de-sac, consistent with treated peritoneal metastatic disease.  Stable 4.5cm homogeneous right pelvic mass, which favors a uterine fibroid although right ovarian neoplasm cannot definitely be excluded.  No new or progressive metastatic disease identified. No evidence of metastatic disease within the thorax.     10/14/2016 Procedure    Robotic-assisted laparoscopic total hysterectomy with bilateral salpingoophorectomy, ex lap omentectomy, radical tumor debulking by Dr. Denman George    10/17/2016 Pathology Results    Diagnosis 1. 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 - CERVIX AND ENDOMETRIUM FREE OF TUMOR. - SEE ONCOLOGY TABLE AND COMMENT. 2. Soft  tissue, biopsy, umbilical nodule - HIGH GRADE SEROUS CARCINOMA. 3. Omentum, resection for tumor - HIGH GRADE SEROUS CARCINOMA, 33 CM.    11/06/2016 - 12/25/2016  Chemotherapy    Carboplatin/Paclitaxel x 3 cycles     01/12/2017 Imaging    CT CAP- 1. Interval hysterectomy, bilateral salpingo-oophorectomy and omentectomy without evidence of tumor recurrence. 2. No evidence of metastatic disease. 3. 5 mm nonobstructing lower pole left renal calculus.    01/13/2017 Remission    No evidence of residual disease on CT imaging.    05/04/2017 Imaging    CT CAP- New small amount of ascites within the abdomen and pelvis since 01/12/2017 which could indicate disease progression but no new identifiable tumor and no significant change in omental and mild peritoneal thickening.  No evidence of metastatic disease within the chest.  Coronary artery disease.  Aortic Atherosclerosis (ICD10-I70.0).    05/04/2017 Tumor Marker    Patient's tumor was tested for the following markers: CA 125 Results of the tumor marker test revealed 7654    05/18/2017 Imaging    ECHO; EF 60% -  65    05/25/2017 - 07/07/2017 Chemotherapy    She received Doxil and Avastin. Treatment is stopped due to disease progression    06/01/2017 Procedure    Successful ultrasound-guided therapeutic paracentesis yielding 2.8 liters of peritoneal fluid    06/22/2017 Tumor Marker    Patient's tumor was tested for the following markers: CA 125 Results of the tumor marker test revealed 13440    07/20/2017 Imaging    Mild increase in peritoneal carcinoma within abdomen pelvis since previous study. No significant change and minimal ascites.  No evidence of metastatic disease within the thorax. New mild airspace opacity in left lower lobe, consistent with inflammatory or infectious etiology.    07/30/2017 Tumor Marker    Patient's tumor was tested for the following markers: CA 125 Results of the tumor marker test revealed 12099    07/30/2017 - 06/14/2018 Chemotherapy    The patient had gemzar. Cisplatin is added on 11/02/18    08/03/2017 - 08/05/2017 Hospital Admission    She was admitted to the  hospital for management of UTI and neutropenic fever    08/13/2017 Adverse Reaction    Dose of chemotherapy is reduced due to neutropenic sepsis    08/24/2017 Tumor Marker    Patient's tumor was tested for the following markers: CA 125 Results of the tumor marker test revealed 7431    09/21/2017 Tumor Marker    Patient's tumor was tested for the following markers: CA 125 Results of the tumor marker test revealed 4176    10/09/2017 Imaging    No significant change in peritoneal carcinomatosis since previous study.  No new or progressive metastatic disease within the abdomen or pelvis.  Stable tiny nonobstructive left renal calculus. No evidence of ureteral calculi or hydronephrosis.    11/10/2017 Tumor Marker    Patient's tumor was tested for the following markers: CA 125 Results of the tumor marker test revealed 4257    11/24/2017 Tumor Marker    Patient's tumor was tested for the following markers: CA 125 Results of the tumor marker test revealed 3377    01/12/2018 Tumor Marker    Patient's tumor was tested for the following markers: CA 125 Results of the tumor marker test revealed 1595    01/21/2018 Imaging    1. Stable to slightly improved omental, gastrohepatic ligament and pelvic disease as detailed above. No new/progressive findings. 2. No acute  abdominal or pelvic findings. 3. Stable lower pole left renal calculus.    02/08/2018 Tumor Marker    Patient's tumor was tested for the following markers: CA 125 Results of the tumor marker test revealed 1058    03/08/2018 Tumor Marker    Patient's tumor was tested for the following markers: CA-125 Results of the tumor marker test revealed 737.1    04/02/2018 Tumor Marker    Patient's tumor was tested for the following markers: CA-125 Results of the tumor marker test revealed 542.8    06/14/2018 Tumor Marker    CA 125- 1010    06/30/2018 Imaging    1. Mixed appearance but overall progression, with the left upper quadrant omental  tumor slightly improved but with some potential new deposition of tumor along the hepatic flexure margin. There is also new moderate ascites which must be considered suspicious for malignant ascites/peritoneal spread. 2. Other imaging findings of potential clinical significance: Nonobstructive left nephrolithiasis. Aortic Atherosclerosis (ICD10-I70.0). Mild subcutaneous edema along the sixth anterior abdominal wall potentially from mild panniculitis.    07/08/2018 Procedure    Successful ultrasound-guided paracentesis yielding 4 liters of peritoneal fluid.    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 female returns for routine follow-up for high-grade serous carcinoma of the peritoneum. She is here today with her husband. She had a paracentesis last week and they removed 5 Liters. She ia fatigued and can not walk long distances. She is SOB with exertion. She reports she still has numbness and tingling to her feet but it is stable at this time. Denies any nausea, vomiting, or diarrhea. Denies any new pains. Had not noticed any recent bleeding such as epistaxis, hematuria or hematochezia. Denies recent chest pain on exertion, pre-syncopal episodes, or palpitations. Denies any numbness or tingling in hands or feet. Denies any recent fevers, infections, or recent hospitalizations. She reports she appetite is 100% and her energy level is 50%.    REVIEW OF SYSTEMS:  Review of Systems  Constitutional: Positive for fatigue.  Neurological: Positive for numbness.  All other systems reviewed and are  negative.    PAST MEDICAL/SURGICAL HISTORY:  Past Medical History:  Diagnosis Date  . Diabetes mellitus without complication (HCC)    on insulin pump  . Dysrhythmia   . 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 Family history of breast cancer   . GERD (gastroesophageal reflux disease)   . History of blood transfusion   . History of bronchitis   . History of chemotherapy   . History of urinary tract infection   . Hyperlipidemia   . Hypertension   . Low serum vitamin D   . Ovarian cancer ([facility] [place]) 05/09/2016  . Shingles    Past Surgical History:  Procedure Laterality Date  . ABDOMINAL HYSTERECTOMY  10/14/2016  . CESAREAN SECTION    . DEBULKING N/A 10/14/2016   Procedure: DEBULKING;  Surgeon: Everitt Amber, MD;  Location: [facility];  Service: Gynecology;  Laterality: N/A;  . IR PARACENTESIS  06/01/2017  . LAPAROTOMY WITH STAGING N/A 10/14/2016  Procedure: LAPAROTOMY WITH OMENTECTOMY AND TUMOR DEBULGING;  Surgeon: Everitt Amber, MD;  Location: [facility];  Service: Gynecology;  Laterality: N/A;  . LUMBAR FUSION  08/21/15   L3-L4 Dr. Timmothy Euler  . OMENTECTOMY N/A 10/14/2016   Procedure: OMENTECTOMY;  Surgeon: Everitt Amber, MD;  Location: [facility];  Service: Gynecology;  Laterality: N/A;  . ROBOTIC ASSISTED TOTAL HYSTERECTOMY WITH BILATERAL SALPINGO OOPHERECTOMY Bilateral 10/14/2016   Procedure: XI ROBOTIC ASSISTED TOTAL LAPARSCOPIC  HYSTERECTOMY WITH BILATERAL SALPINGO OOPHORECTOMY;  Surgeon: Everitt Amber, MD;  Location: [facility];  Service: Gynecology;  Laterality: Bilateral;     SOCIAL HISTORY:  Social History   Socioeconomic History  . Marital status: Married    Spouse name: Gershon Mussel  . Number of children: 2  . Years of education: Not on file  . Highest education level: Not on file  Occupational History  . Occupation: retired  Scientific laboratory technician  . Financial resource strain: Not on file  . Food insecurity:    Worry: Not on file    Inability: Not on file  . Transportation needs:     Medical: Not on file    Non-medical: Not on file  Tobacco Use  . Smoking status: Never Smoker  . Smokeless tobacco: Never Used  Substance and Sexual Activity  . Alcohol use: No  . Drug use: No  . Sexual activity: Not on file    Comment: married  Lifestyle  . Physical activity:    Days per week: Not on file    Minutes per session: Not on file  . Stress: Not on file  Relationships  . Social connections:    Talks on phone: Not on file    Gets together: Not on file    Attends religious service: Not on file    Active member of club or organization: Not on file    Attends meetings of clubs or organizations: Not on file    Relationship status: Not on file  . Intimate partner violence:    Fear of current or ex partner: Not on file    Emotionally abused: Not on file    Physically abused: Not on file    Forced sexual activity: Not on file  Other Topics Concern  . Not on file  Social History Narrative  . Not on file    FAMILY HISTORY:  Family History  Problem Relation Age of Onset  . Lung cancer Mother        smoker; dx in her 61s  . Leukemia Father   . Diabetes Paternal Grandmother   . Heart attack Paternal Grandmother   . Diabetes Paternal Grandfather   . Breast cancer Paternal Aunt        dx in her 39s-30s  . Leukemia Paternal Uncle   . Heart attack Maternal Grandfather   . Breast cancer Cousin        maternal first cousin    CURRENT MEDICATIONS:  Outpatient Encounter Medications as of 11/16/2018  Medication Sig  . atorvastatin (LIPITOR) 40 MG tablet Take 1 tablet (40 mg total) by mouth daily.  . Biotin 1000 MCG tablet Take 1,000 mcg by mouth daily.   . furosemide (LASIX) 20 MG tablet Take 1 tablet (20 mg total) by mouth 2 (two) times daily.  Marland Kitchen glucose blood (ONE TOUCH ULTRA TEST) test strip Use to test blood sugar 6-8 times daily  . HYDROcodone-acetaminophen (NORCO) 10-325 MG tablet Take 1 tablet by mouth every 4 (four) hours as needed.  . insulin lispro (HUMALOG)  100 UNIT/ML injection USE IN INSULIN PUMP AS DIRECTED. MAX DAILY DOSE OF 110 UNITS PER DAY. (Patient taking differently: Inject 110 Units into the skin See admin instructions. USE IN INSULIN PUMP AS DIRECTED. MAX DAILY DOSE OF 110 UNITS PER DAY.)  . lidocaine-prilocaine (EMLA) cream Apply a quarter size amount to port site 1 hour prior to chemo. Do not rub in. Cover with plastic wrap.  . magnesium oxide (MAG-OX) 400 MG tablet Take 400 mg by mouth 2 (two) times daily.  . Multiple Vitamin (MULTIVITAMIN WITH MINERALS) TABS Take 1 tablet by mouth daily.  Marland Kitchen omeprazole (PRILOSEC) 40 MG capsule Take 1 capsule (40 mg total) by mouth daily.  . polyethylene glycol (MIRALAX / GLYCOLAX) packet Take 17 g by mouth daily as needed for mild constipation.   . potassium chloride SA (K-DUR,KLOR-CON) 20 MEQ tablet Take 1 tablet (20 mEq total) by mouth daily.  . prochlorperazine (COMPAZINE) 10 MG tablet Take 1 tablet (10 mg total) by mouth every 6 (six) hours as needed for nausea or vomiting.  . RUBRACA 300 MG tablet TAKE 1 TABLET (300 MG TOTAL) BY MOUTH 2 (TWO) TIMES DAILY. (Patient taking differently: 300 mg daily. )  . vitamin B-12 (CYANOCOBALAMIN) 1000 MCG tablet Take 1,000 mcg by mouth daily.  . vitamin E (VITAMIN E) 400 UNIT capsule Take 400 Units by mouth daily.  . [DISCONTINUED] HYDROcodone-acetaminophen (NORCO) 10-325 MG tablet Take 1 tablet by mouth every 4 (four) hours as needed.   No facility-administered encounter medications on file as of 11/16/2018.     ALLERGIES:  Allergies  Allergen Reactions  . Avandia [Rosiglitazone] Other (See Comments)    Legs swelled  . Micronase [Glyburide] Swelling  . Actos [Pioglitazone] Other (See Comments)    Edema / leg swelling     PHYSICAL EXAM:  ECOG Performance status: 1  Vitals:   11/16/18 1000  BP: (!) 127/50  Pulse: 95  Resp: 16  Temp: 97.9 F (36.6 C)  SpO2: 99%   Filed Weights   11/16/18 1000  Weight: 209 lb 5 oz (94.9 kg)    Physical  Exam Constitutional:      Appearance: Normal appearance. She is normal weight.  Cardiovascular:     Rate and Rhythm: Normal rate and regular rhythm.     Heart sounds: Normal heart sounds.  Pulmonary:     Effort: Pulmonary effort is normal.     Breath sounds: Normal breath sounds.  Abdominal:     General: There is distension.  Musculoskeletal:     Comments: wheelchair  Skin:    General: Skin is warm and dry.  Neurological:     Mental Status: She is alert and oriented to person, place, and time. Mental status is at baseline.  Psychiatric:        Mood and Affect: Mood normal.        Behavior: Behavior normal.        Thought Content: Thought content normal.        Judgment: Judgment normal.      LABORATORY DATA:  I have reviewed the labs as listed.  CBC    Component Value Date/Time   WBC 5.4 11/16/2018 0946   RBC 3.20 (L) 11/16/2018 0946   HGB 9.9 (L) 11/16/2018 0946   HGB 9.6 (L) 11/02/2017 0756   HCT 31.8 (L) 11/16/2018 0946   HCT 21.0 (L) 10/11/2018 0556   HCT 30.6 (L) 11/02/2017 0756   PLT 150 11/16/2018 0946   PLT 150 11/02/2017 0756  PLT 40 (LL) 11/18/2016 1642   MCV 99.4 11/16/2018 0946   MCV 108.5 (H) 11/02/2017 0756   MCH 30.9 11/16/2018 0946   MCHC 31.1 11/16/2018 0946   RDW 17.1 (H) 11/16/2018 0946   RDW 17.8 (H) 11/02/2017 0756   LYMPHSABS 0.3 (L) 11/16/2018 0946   LYMPHSABS 0.4 (L) 11/02/2017 0756   MONOABS 0.4 11/16/2018 0946   MONOABS 0.5 11/02/2017 0756   EOSABS 0.1 11/16/2018 0946   EOSABS 0.7 (H) 11/02/2017 0756   EOSABS 0.1 11/18/2016 1642   BASOSABS 0.0 11/16/2018 0946   BASOSABS 0.0 11/02/2017 0756   CMP Latest Ref Rng & Units 11/16/2018 11/08/2018 11/01/2018  Glucose 70 - 99 mg/dL 122(H) 125(H) 85  BUN 8 - 23 mg/dL 17 38(H) 25(H)  Creatinine 0.44 - 1.00 mg/dL 1.19(H) 2.02(H) 1.43(H)  Sodium 135 - 145 mmol/L 135 136 138  Potassium 3.5 - 5.1 mmol/L 3.9 4.6 2.9(L)  Chloride 98 - 111 mmol/L 102 101 101  CO2 22 - 32 mmol/L '24 26 26  '$ Calcium  8.9 - 10.3 mg/dL 8.7(L) 9.1 8.8(L)  Total Protein 6.5 - 8.1 g/dL 5.8(L) 6.1(L) 6.3(L)  Total Bilirubin 0.3 - 1.2 mg/dL 0.6 0.6 0.7  Alkaline Phos 38 - 126 U/L 60 69 70  AST 15 - 41 U/L '22 31 22  '$ ALT 0 - 44 U/L '16 17 13       '$ DIAGNOSTIC IMAGING:  I have independently reviewed the scans and discussed with the patient.   I have reviewed Francene Finders, NP's note and agree with the documentation.  I personally performed a face-to-face visit, made revisions and my assessment and plan is as follows.    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 -Underwent 7 cycles of chemotherapy with carboplatin and paclitaxel from 06/01/2016 through 09/18/2016. -Germline mutation testing on 06/25/2016 was negative. - Underwent robotic assisted tumor debulking surgery on 10/14/2016 by Dr. Denman George. -3 cycles of chemotherapy with carboplatin and paclitaxel from 11/06/2016 through 12/25/2016. - Second line therapy with Doxil and bevacizumab from 05/25/2017 through 07/07/2017. - Third line therapy with gemcitabine from 07/30/2017 through December 2018, gemcitabine and cisplatin day 1 and day 8 q. 21 days from 11/02/2017, 7 cycles completed on 06/14/2018, with CT scan on 07/01/2018 showing progression. -She underwent paracentesis, with 4 L removed on 07/08/2018.  There is a subcutaneous area of skin thickening at the paracentesis site, questionable for seeding. - 3 cycles of docetaxel 75 mg/m from 07/16/2018 through 08/26/2018. - Ca1 25 levels on 07/01/2018 was 2005, 2367 on day of cycle 2, 2333 on day of cycle 3. -We discussed the results of the CT scan CAP which showed progression of her disease.  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  FDA  approved therapeutic option rucaparib was suggested. - Rucaparib 600 mg twice daily started on 09/25/2018. -She had paracentesis on 09/21/2018, 7 L fluid removed. - 7 days after starting rucaparib, she started feeling very weak and nauseous.  Rucaparib was held on 10/05/2018 due to severe weakness.  -She was admitted to the hospital on 10/08/2018 through 10/11/2018 with weakness, and renal failure. -CT abdomen on 10/09/2018 showed stable omental and peritoneal disease. - I have decreased rucaparib to 300 mg twice a day on 10/15/2018. -Rucaparib was further dose reduced  to 300 mg once daily on 11/08/2018 secondary to elevated creatinine of 2.0. -We reviewed her blood work today.  Creatinine improved to 1.1. - Her eating and energy levels improved.  She will continue rucaparib 300 mg once daily.  She will be reevaluated in 10 days.  2.  Malignant ascites: -Paracentesis on 07/08/2018, 4 L removed. -Paracentesis on 09/21/2018, 7 L removed. -Paracentesis on 10/11/2018 with 4 L removed. -Paracentesis on 11/11/2018 with 5 L of yellow fluid removed. -She was told to increase Lasix to 20 mg twice daily.  3.  CKD: - Creatinine improved to 1.17.  Benazepril and metformin are on hold. -She was told to increase Lasix to 20 mg twice daily as she noticed fluid is building up. -She will continue potassium half tablet daily.  She will continue magnesium 1 tablet twice daily.  4.  Diabetes type 2: -She is on insulin pump and her sugars are fairly well controlled.  5.  Right posterior thigh pain: -She has right lower back pain and posterior thigh pain since October 2016 from her back problems. -She takes hydrocodone 10 mg every 4 hours.  We have renewed it.    6.  Peripheral neuropathy: -She has on and off numbness in the toes.  No numbness in the hands reported.  She was prescribed gabapentin few months ago but she is not taking it.      Orders placed this encounter:  Orders Placed This Encounter    Procedures  . CA 125  . Magnesium  . CBC with Differential/Platelet  . Comprehensive metabolic panel      Derek Jack, MD [place] [phone]

## 2018-11-16 NOTE — Assessment & Plan Note (Addendum)
1.  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 -Underwent 7 cycles of chemotherapy with carboplatin and paclitaxel from 06/01/2016 through 09/18/2016. -Germline mutation testing on 06/25/2016 was negative. - Underwent robotic assisted tumor debulking surgery on 10/14/2016 by Dr. Denman George. -3 cycles of chemotherapy with carboplatin and paclitaxel from 11/06/2016 through 12/25/2016. - Second line therapy with Doxil and bevacizumab from 05/25/2017 through 07/07/2017. - Third line therapy with gemcitabine from 07/30/2017 through December 2018, gemcitabine and cisplatin day 1 and day 8 q. 21 days from 11/02/2017, 7 cycles completed on 06/14/2018, with CT scan on 07/01/2018 showing progression. -She underwent paracentesis, with 4 L removed on 07/08/2018.  There is a subcutaneous area of skin thickening at the paracentesis site, questionable for seeding. - 3 cycles of docetaxel 75 mg/m from 07/16/2018 through 08/26/2018. - Ca1 25 levels on 07/01/2018 was 2005, 2367 on day of cycle 2, 2333 on day of cycle 3. -We discussed the results of the CT scan CAP which showed progression of her disease.  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  FDA approved therapeutic option rucaparib was suggested. - Rucaparib 600 mg twice daily started on 09/25/2018. -She had paracentesis on 09/21/2018, 7 L fluid removed. - 7 days after starting rucaparib, she started feeling very weak and nauseous.  Rucaparib was held on 10/05/2018 due to severe weakness.  -She was admitted to the hospital on 10/08/2018 through 10/11/2018 with weakness, and renal failure. -CT abdomen on 10/09/2018 showed stable omental and peritoneal disease. - I have decreased rucaparib to 300 mg twice a day on 10/15/2018. -Rucaparib was further dose reduced to 300 mg  once daily on 11/08/2018 secondary to elevated creatinine of 2.0. -We reviewed her blood work today.  Creatinine improved to 1.1. - Her eating and energy levels improved.  She will continue rucaparib 300 mg once daily.  She will be reevaluated in 10 days.  2.  Malignant ascites: -Paracentesis on 07/08/2018, 4 L removed. -Paracentesis on 09/21/2018, 7 L removed. -Paracentesis on 10/11/2018 with 4 L removed. -Paracentesis on 11/11/2018 with 5 L of yellow fluid removed. -She was told to increase Lasix to 20 mg twice daily.  3.  CKD: - Creatinine improved to 1.17.  Benazepril and metformin are on hold. -She was told to increase Lasix to 20 mg twice daily as she noticed fluid is building up. -She will continue potassium half tablet daily.  She will continue magnesium 1 tablet twice daily.  4.  Diabetes type 2: -She is on insulin pump and her sugars are fairly well controlled.  5.  Right posterior thigh pain: -She has right lower back pain and posterior thigh pain since October 2016 from her back problems. -She takes hydrocodone 10 mg every 4 hours.  We have renewed it.    6.  Peripheral neuropathy: -She has on and off numbness in the toes.  No numbness in the hands reported.  She was prescribed gabapentin few months ago but she is not taking it.

## 2018-11-16 NOTE — Patient Instructions (Signed)
[facility] at [facility] Discharge Instructions     Thank you for choosing [facility] at [facility] to provide your oncology and hematology care.  To afford each patient quality time with our provider, please arrive at least 15 minutes before your scheduled appointment time.   If you have a lab appointment with the Cancer Center please come in thru the  Main Entrance and check in at the main information desk  You need to re-schedule your appointment should you arrive 10 or more minutes late.  We strive to give you quality time with our providers, and arriving late affects you and other patients whose appointments are after yours.  Also, if you no show three or more times for appointments you may be dismissed from the clinic at the providers discretion.     Again, thank you for choosing [facility].  Our hope is that these requests will decrease the amount of time that you wait before being seen by our physicians.       _____________________________________________________________  Should you have questions after your visit to [facility], please contact our office at [phone] between the hours of 8:00 a.m. and 4:30 p.m.  Voicemails left after 4:00 p.m. will not be returned until the following business day.  For prescription refill requests, have your pharmacy contact our office and allow 72 hours.    Cancer Center Support Programs:   > Cancer Support Group  2nd Tuesday of the month 1pm-2pm, Journey Room    

## 2018-11-17 LAB — CA 125: CANCER ANTIGEN (CA) 125: 2205 U/mL — AB (ref 0.0–38.1)

## 2018-11-24 ENCOUNTER — Other Ambulatory Visit (HOSPITAL_COMMUNITY): Payer: Self-pay | Admitting: Nurse Practitioner

## 2018-11-24 DIAGNOSIS — C569 Malignant neoplasm of unspecified ovary: Secondary | ICD-10-CM

## 2018-11-25 ENCOUNTER — Other Ambulatory Visit (HOSPITAL_COMMUNITY): Payer: Self-pay | Admitting: Pharmacist

## 2018-11-25 ENCOUNTER — Inpatient Hospital Stay (HOSPITAL_COMMUNITY): Payer: Medicare Other

## 2018-11-25 ENCOUNTER — Other Ambulatory Visit (HOSPITAL_COMMUNITY): Payer: Self-pay | Admitting: Nurse Practitioner

## 2018-11-25 ENCOUNTER — Telehealth (HOSPITAL_COMMUNITY): Payer: Self-pay | Admitting: *Deleted

## 2018-11-25 ENCOUNTER — Encounter (HOSPITAL_COMMUNITY): Payer: Self-pay | Admitting: Hematology

## 2018-11-25 ENCOUNTER — Encounter (HOSPITAL_COMMUNITY): Payer: Self-pay

## 2018-11-25 ENCOUNTER — Other Ambulatory Visit: Payer: Self-pay

## 2018-11-25 ENCOUNTER — Inpatient Hospital Stay (HOSPITAL_COMMUNITY): Payer: Medicare Other | Admitting: Hematology

## 2018-11-25 VITALS — BP 135/57 | HR 105 | Temp 98.2°F | Resp 18 | Wt 215.0 lb

## 2018-11-25 DIAGNOSIS — Z79899 Other long term (current) drug therapy: Secondary | ICD-10-CM

## 2018-11-25 DIAGNOSIS — I700 Atherosclerosis of aorta: Secondary | ICD-10-CM

## 2018-11-25 DIAGNOSIS — N189 Chronic kidney disease, unspecified: Secondary | ICD-10-CM

## 2018-11-25 DIAGNOSIS — R180 Malignant ascites: Secondary | ICD-10-CM

## 2018-11-25 DIAGNOSIS — R600 Localized edema: Secondary | ICD-10-CM

## 2018-11-25 DIAGNOSIS — Z90710 Acquired absence of both cervix and uterus: Secondary | ICD-10-CM | POA: Diagnosis not present

## 2018-11-25 DIAGNOSIS — Z9221 Personal history of antineoplastic chemotherapy: Secondary | ICD-10-CM

## 2018-11-25 DIAGNOSIS — E1122 Type 2 diabetes mellitus with diabetic chronic kidney disease: Secondary | ICD-10-CM

## 2018-11-25 DIAGNOSIS — Z9641 Presence of insulin pump (external) (internal): Secondary | ICD-10-CM

## 2018-11-25 DIAGNOSIS — R5383 Other fatigue: Secondary | ICD-10-CM

## 2018-11-25 DIAGNOSIS — C482 Malignant neoplasm of peritoneum, unspecified: Secondary | ICD-10-CM | POA: Diagnosis not present

## 2018-11-25 DIAGNOSIS — R531 Weakness: Secondary | ICD-10-CM

## 2018-11-25 DIAGNOSIS — C569 Malignant neoplasm of unspecified ovary: Secondary | ICD-10-CM

## 2018-11-25 DIAGNOSIS — I129 Hypertensive chronic kidney disease with stage 1 through stage 4 chronic kidney disease, or unspecified chronic kidney disease: Secondary | ICD-10-CM

## 2018-11-25 DIAGNOSIS — Z806 Family history of leukemia: Secondary | ICD-10-CM

## 2018-11-25 DIAGNOSIS — G629 Polyneuropathy, unspecified: Secondary | ICD-10-CM

## 2018-11-25 DIAGNOSIS — R197 Diarrhea, unspecified: Secondary | ICD-10-CM

## 2018-11-25 DIAGNOSIS — C481 Malignant neoplasm of specified parts of peritoneum: Secondary | ICD-10-CM

## 2018-11-25 DIAGNOSIS — Z801 Family history of malignant neoplasm of trachea, bronchus and lung: Secondary | ICD-10-CM

## 2018-11-25 DIAGNOSIS — Z90722 Acquired absence of ovaries, bilateral: Secondary | ICD-10-CM

## 2018-11-25 DIAGNOSIS — Z803 Family history of malignant neoplasm of breast: Secondary | ICD-10-CM

## 2018-11-25 DIAGNOSIS — K219 Gastro-esophageal reflux disease without esophagitis: Secondary | ICD-10-CM

## 2018-11-25 DIAGNOSIS — R0609 Other forms of dyspnea: Secondary | ICD-10-CM

## 2018-11-25 LAB — CBC WITH DIFFERENTIAL/PLATELET
Abs Immature Granulocytes: 0.02 10*3/uL (ref 0.00–0.07)
BASOS ABS: 0 10*3/uL (ref 0.0–0.1)
Basophils Relative: 0 %
Eosinophils Absolute: 0.2 10*3/uL (ref 0.0–0.5)
Eosinophils Relative: 2 %
HCT: 35.1 % — ABNORMAL LOW (ref 36.0–46.0)
Hemoglobin: 10.5 g/dL — ABNORMAL LOW (ref 12.0–15.0)
Immature Granulocytes: 0 %
Lymphocytes Relative: 6 %
Lymphs Abs: 0.4 10*3/uL — ABNORMAL LOW (ref 0.7–4.0)
MCH: 30.4 pg (ref 26.0–34.0)
MCHC: 29.9 g/dL — ABNORMAL LOW (ref 30.0–36.0)
MCV: 101.7 fL — ABNORMAL HIGH (ref 80.0–100.0)
Monocytes Absolute: 0.6 10*3/uL (ref 0.1–1.0)
Monocytes Relative: 9 %
Neutro Abs: 5.5 10*3/uL (ref 1.7–7.7)
Neutrophils Relative %: 83 %
Platelets: 193 10*3/uL (ref 150–400)
RBC: 3.45 MIL/uL — ABNORMAL LOW (ref 3.87–5.11)
RDW: 17.9 % — AB (ref 11.5–15.5)
WBC: 6.6 10*3/uL (ref 4.0–10.5)
nRBC: 0 % (ref 0.0–0.2)

## 2018-11-25 LAB — COMPREHENSIVE METABOLIC PANEL
ALT: 12 U/L (ref 0–44)
AST: 24 U/L (ref 15–41)
Albumin: 2.9 g/dL — ABNORMAL LOW (ref 3.5–5.0)
Alkaline Phosphatase: 64 U/L (ref 38–126)
Anion gap: 11 (ref 5–15)
BUN: 31 mg/dL — ABNORMAL HIGH (ref 8–23)
CO2: 25 mmol/L (ref 22–32)
Calcium: 8.7 mg/dL — ABNORMAL LOW (ref 8.9–10.3)
Chloride: 100 mmol/L (ref 98–111)
Creatinine, Ser: 1.93 mg/dL — ABNORMAL HIGH (ref 0.44–1.00)
GFR calc Af Amer: 31 mL/min — ABNORMAL LOW (ref >60)
GFR calc non Af Amer: 27 mL/min — ABNORMAL LOW (ref >60)
Glucose, Bld: 165 mg/dL — ABNORMAL HIGH (ref 70–99)
POTASSIUM: 4.7 mmol/L (ref 3.5–5.1)
Sodium: 136 mmol/L (ref 135–145)
Total Bilirubin: 1.1 mg/dL (ref 0.3–1.2)
Total Protein: 5.6 g/dL — ABNORMAL LOW (ref 6.5–8.1)

## 2018-11-25 LAB — MAGNESIUM: Magnesium: 1.9 mg/dL (ref 1.7–2.4)

## 2018-11-25 MED ORDER — ALBUMIN HUMAN 25 % IV SOLN: 50.0000 g | Freq: Once | INTRAVENOUS | Status: DC

## 2018-11-25 NOTE — Progress Notes (Signed)
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 This may potentially be ovarian or a GI in etiology.    04/30/2016 Tumor Marker    CA 125- 7149.0 (H)    05/01/2016 Procedure    US paracentesis- Successful ultrasound-guided paracentesis yielding 1.8 liters of peritoneal fluid.    05/01/2016 Imaging    US pelvis- Both transabdominal and transvaginal sonography are significantly limited by large patient habitus and ascites. Neither uterus or ovaries were visualized on this exam.    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    05/08/2016 Imaging    CT chest- No evidence of metastatic disease in the chest. Peritoneal/omental disease with abdominal ascites in the upper abdomen, incompletely visualized.     05/13/2016 Procedure    Placement of single lumen port a cath via right internal jugular vein. The catheter tip lies at the cavoatrial junction. A power injectable port a cath was placed and is ready for immediate use.    05/15/2016 Procedure    US Paracentesis- 3400 ml yellow colored ascites removed    05/15/2016 - 09/18/2016 Chemotherapy    Carboplatin/Paclitaxel every 21 days x 7 cycles    07/01/2016 Miscellaneous    Genetic Counseling by [name]-  Genetic testing was normal, and did not reveal a deleterious mutation in these genes.     07/08/2016 Imaging    CT CAP- 1. Small volume ascites, significantly decreased. 2. Stable diffuse omental soft tissue caking and diffuse peritoneal thickening along the bilateral paracolic gutters and bilateral pelvic peritoneal reflections, consistent with peritoneal carcinomatosis. 3. Stable asymmetrically enlarged right ovary, which may represent the primary site of ovarian malignancy. 4. No evidence of metastatic disease in the chest. No new sites of metastatic disease in the abdomen or pelvis.    07/09/2016 Miscellaneous    Gyn Onc re-evaluation- modest response to therapy, 3 more cycles of chemotherapy recommended.      09/11/2016 Imaging    CT C/A/P No significant change omental soft tissue caking, consistent with metastatic disease. Mild ascites is decreased since previous study.  Increased calcification along peritoneal surface in pelvic cul-de-sac, consistent with treated peritoneal metastatic disease.  Stable 4.5cm homogeneous right pelvic mass, which favors a uterine fibroid although right ovarian neoplasm cannot definitely be excluded.  No new or progressive metastatic disease identified. No evidence of metastatic disease within the thorax.     10/14/2016 Procedure    Robotic-assisted laparoscopic total hysterectomy with bilateral salpingoophorectomy, ex lap omentectomy, radical tumor debulking by Dr. Denman George    10/17/2016 Pathology Results    Diagnosis 1. 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 - CERVIX AND ENDOMETRIUM FREE OF TUMOR. - SEE ONCOLOGY TABLE AND COMMENT. 2. Soft  tissue, biopsy, umbilical nodule - HIGH GRADE SEROUS CARCINOMA. 3. Omentum, resection for tumor - HIGH GRADE SEROUS CARCINOMA, 33 CM.    11/06/2016 - 12/25/2016  Chemotherapy    Carboplatin/Paclitaxel x 3 cycles     01/12/2017 Imaging    CT CAP- 1. Interval hysterectomy, bilateral salpingo-oophorectomy and omentectomy without evidence of tumor recurrence. 2. No evidence of metastatic disease. 3. 5 mm nonobstructing lower pole left renal calculus.    01/13/2017 Remission    No evidence of residual disease on CT imaging.    05/04/2017 Imaging    CT CAP- New small amount of ascites within the abdomen and pelvis since 01/12/2017 which could indicate disease progression but no new identifiable tumor and no significant change in omental and mild peritoneal thickening.  No evidence of metastatic disease within the chest.  Coronary artery disease.  Aortic Atherosclerosis (ICD10-I70.0).    05/04/2017 Tumor Marker    Patient's tumor was tested for the following markers: CA 125 Results of the tumor marker test revealed 7654    05/18/2017 Imaging    ECHO; EF 60% -  65    05/25/2017 - 07/07/2017 Chemotherapy    She received Doxil and Avastin. Treatment is stopped due to disease progression    06/01/2017 Procedure    Successful ultrasound-guided therapeutic paracentesis yielding 2.8 liters of peritoneal fluid    06/22/2017 Tumor Marker    Patient's tumor was tested for the following markers: CA 125 Results of the tumor marker test revealed 13440    07/20/2017 Imaging    Mild increase in peritoneal carcinoma within abdomen pelvis since previous study. No significant change and minimal ascites.  No evidence of metastatic disease within the thorax. New mild airspace opacity in left lower lobe, consistent with inflammatory or infectious etiology.    07/30/2017 Tumor Marker    Patient's tumor was tested for the following markers: CA 125 Results of the tumor marker test revealed 12099    07/30/2017 - 06/14/2018 Chemotherapy    The patient had gemzar. Cisplatin is added on 11/02/18    08/03/2017 - 08/05/2017 Hospital Admission    She was admitted to the  hospital for management of UTI and neutropenic fever    08/13/2017 Adverse Reaction    Dose of chemotherapy is reduced due to neutropenic sepsis    08/24/2017 Tumor Marker    Patient's tumor was tested for the following markers: CA 125 Results of the tumor marker test revealed 7431    09/21/2017 Tumor Marker    Patient's tumor was tested for the following markers: CA 125 Results of the tumor marker test revealed 4176    10/09/2017 Imaging    No significant change in peritoneal carcinomatosis since previous study.  No new or progressive metastatic disease within the abdomen or pelvis.  Stable tiny nonobstructive left renal calculus. No evidence of ureteral calculi or hydronephrosis.    11/10/2017 Tumor Marker    Patient's tumor was tested for the following markers: CA 125 Results of the tumor marker test revealed 4257    11/24/2017 Tumor Marker    Patient's tumor was tested for the following markers: CA 125 Results of the tumor marker test revealed 3377    01/12/2018 Tumor Marker    Patient's tumor was tested for the following markers: CA 125 Results of the tumor marker test revealed 1595    01/21/2018 Imaging    1. Stable to slightly improved omental, gastrohepatic ligament and pelvic disease as detailed above. No new/progressive findings. 2. No acute  abdominal or pelvic findings. 3. Stable lower pole left renal calculus.    02/08/2018 Tumor Marker    Patient's tumor was tested for the following markers: CA 125 Results of the tumor marker test revealed 1058    03/08/2018 Tumor Marker    Patient's tumor was tested for the following markers: CA-125 Results of the tumor marker test revealed 737.1    04/02/2018 Tumor Marker    Patient's tumor was tested for the following markers: CA-125 Results of the tumor marker test revealed 542.8    06/14/2018 Tumor Marker    CA 125- 1010    06/30/2018 Imaging    1. Mixed appearance but overall progression, with the left upper quadrant omental  tumor slightly improved but with some potential new deposition of tumor along the hepatic flexure margin. There is also new moderate ascites which must be considered suspicious for malignant ascites/peritoneal spread. 2. Other imaging findings of potential clinical significance: Nonobstructive left nephrolithiasis. Aortic Atherosclerosis (ICD10-I70.0). Mild subcutaneous edema along the sixth anterior abdominal wall potentially from mild panniculitis.    07/08/2018 Procedure    Successful ultrasound-guided paracentesis yielding 4 liters of peritoneal fluid.    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 Hazell 68 y.o. female returns for routine follow-up for high-grade serous carcinoma of the peritoneum. She is here today with her husband. She is still in a wheelchair and unable to move around very much. She recently had a visit to the urgent care due to a yeast infection under her breast. She reports it has cleared up now. She is still fatigued and weak. Her last paracentesis was on 11/11/2018 5 liters was removed. She is set up for another one on Wednesday 12/01/2018. Denies any nausea, vomiting, or diarrhea. Denies any new pains. Had not noticed any recent bleeding such as epistaxis, hematuria or hematochezia. Denies recent chest pain on exertion, shortness of breath on minimal exertion, pre-syncopal episodes, or palpitations. Denies any numbness or tingling in hands or feet. Denies any recent fevers, infections, or recent hospitalizations. Patient reports appetite at 100% and energy level at 50%.   REVIEW  OF SYSTEMS:  Review of Systems  Cardiovascular: Positive for leg swelling.  Gastrointestinal: Positive for diarrhea.  All other systems reviewed and are negative.    PAST MEDICAL/SURGICAL HISTORY:  Past Medical History:  Diagnosis Date  . Diabetes mellitus without complication (HCC)    on insulin pump  . Dysrhythmia   . 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 Family history of breast cancer   . GERD (gastroesophageal reflux disease)   . History of blood transfusion   . History of bronchitis   . History of chemotherapy   . History of urinary tract infection   . Hyperlipidemia   . Hypertension   . Low serum vitamin D   . Ovarian cancer ([facility]) 05/09/2016  . Shingles    Past Surgical History:  Procedure Laterality Date  . ABDOMINAL HYSTERECTOMY  10/14/2016  . CESAREAN SECTION    . DEBULKING N/A 10/14/2016   Procedure: DEBULKING;  Surgeon: Everitt Amber, MD;  Location: [facility];  Service: Gynecology;  Laterality: N/A;  . IR PARACENTESIS  06/01/2017  . LAPAROTOMY WITH STAGING N/A 10/14/2016   Procedure: LAPAROTOMY WITH OMENTECTOMY AND TUMOR DEBULGING;  Surgeon: Everitt Amber, MD;  Location: [facility];  Service: Gynecology;  Laterality: N/A;  . LUMBAR FUSION  08/21/15   L3-L4 Dr. Timmothy Euler  . OMENTECTOMY N/A 10/14/2016   Procedure: OMENTECTOMY;  Surgeon: Everitt Amber, MD;  Location: [facility];  Service: Gynecology;  Laterality: N/A;  . ROBOTIC ASSISTED TOTAL HYSTERECTOMY WITH BILATERAL SALPINGO OOPHERECTOMY Bilateral 10/14/2016   Procedure: XI ROBOTIC ASSISTED TOTAL LAPARSCOPIC  HYSTERECTOMY WITH BILATERAL SALPINGO OOPHORECTOMY;  Surgeon: Everitt Amber, MD;  Location: [facility];  Service: Gynecology;  Laterality: Bilateral;     SOCIAL HISTORY:  Social History   Socioeconomic History  . Marital status: Married    Spouse name: Gershon Mussel  . Number of children: 2  . Years of education: Not on file  . Highest education level: Not on file  Occupational History  . Occupation: retired    Scientific laboratory technician  . Financial resource strain: Not on file  . Food insecurity:    Worry: Not on file    Inability: Not on file  . Transportation needs:    Medical: Not on file    Non-medical: Not on file  Tobacco Use  . Smoking status: Never Smoker  . Smokeless tobacco: Never Used  Substance and Sexual Activity  . Alcohol use: No  . Drug use: No  . Sexual activity: Not on file    Comment: married  Lifestyle  . Physical activity:    Days per week: Not on file    Minutes per session: Not on file  . Stress: Not on file  Relationships  . Social connections:    Talks on phone: Not on file    Gets together: Not on file    Attends religious service: Not on file    Active member of club or organization: Not on file    Attends meetings of clubs or organizations: Not on file    Relationship status: Not on file  . Intimate partner violence:    Fear of current or ex partner: Not on file    Emotionally abused: Not on file    Physically abused: Not on file    Forced sexual activity: Not on file  Other Topics Concern  . Not on file  Social History Narrative  . Not on file    FAMILY HISTORY:  Family History  Problem Relation Age of Onset  . Lung cancer Mother        smoker; dx in her 18s  . Leukemia Father   . Diabetes Paternal Grandmother   . Heart attack Paternal Grandmother   . Diabetes Paternal Grandfather   . Breast cancer Paternal Aunt        dx in her 41s-30s  . Leukemia Paternal Uncle   . Heart attack Maternal Grandfather   . Breast cancer Cousin        maternal first cousin    CURRENT MEDICATIONS:  Outpatient Encounter Medications as of 11/25/2018  Medication Sig  . atorvastatin (LIPITOR) 40 MG tablet Take 1 tablet (40 mg total) by mouth daily.  . Biotin 1000 MCG tablet Take 1,000 mcg by mouth daily.   . furosemide (LASIX) 20 MG tablet Take 1 tablet (20 mg total) by mouth 2 (two) times daily.  Marland Kitchen glucose blood (ONE TOUCH ULTRA TEST) test strip Use to test blood  sugar  6-8 times daily  . HYDROcodone-acetaminophen (NORCO) 10-325 MG tablet Take 1 tablet by mouth every 4 (four) hours as needed.  . insulin lispro (HUMALOG) 100 UNIT/ML injection USE IN INSULIN PUMP AS DIRECTED. MAX DAILY DOSE OF 110 UNITS PER DAY. (Patient taking differently: Inject 110 Units into the skin See admin instructions. USE IN INSULIN PUMP AS DIRECTED. MAX DAILY DOSE OF 110 UNITS PER DAY.)  . lidocaine-prilocaine (EMLA) cream Apply a quarter size amount to port site 1 hour prior to chemo. Do not rub in. Cover with plastic wrap.  . magnesium oxide (MAG-OX) 400 MG tablet Take 400 mg by mouth 2 (two) times daily.  . Multiple Vitamin (MULTIVITAMIN WITH MINERALS) TABS Take 1 tablet by mouth daily.  Marland Kitchen omeprazole (PRILOSEC) 40 MG capsule Take 1 capsule (40 mg total) by mouth daily.  . potassium chloride SA (K-DUR,KLOR-CON) 20 MEQ tablet Take 1 tablet (20 mEq total) by mouth daily.  . RUBRACA 300 MG tablet TAKE 1 TABLET (300 MG TOTAL) BY MOUTH 2 (TWO) TIMES DAILY. (Patient taking differently: Take 300 mg by mouth daily. )  . vitamin B-12 (CYANOCOBALAMIN) 1000 MCG tablet Take 1,000 mcg by mouth daily.  . vitamin E (VITAMIN E) 400 UNIT capsule Take 400 Units by mouth daily.  . polyethylene glycol (MIRALAX / GLYCOLAX) packet Take 17 g by mouth daily as needed for mild constipation.   . prochlorperazine (COMPAZINE) 10 MG tablet Take 1 tablet (10 mg total) by mouth every 6 (six) hours as needed for nausea or vomiting. (Patient not taking: Reported on 11/25/2018)   No facility-administered encounter medications on file as of 11/25/2018.     ALLERGIES:  Allergies  Allergen Reactions  . Avandia [Rosiglitazone] Other (See Comments)    Legs swelled  . Micronase [Glyburide] Swelling  . Actos [Pioglitazone] Other (See Comments)    Edema / leg swelling     PHYSICAL EXAM:  ECOG Performance status: 1  Vitals:   11/25/18 0800  BP: (!) 135/57  Pulse: (!) 105  Resp: 18  Temp: 98.2 F (36.8 C)    SpO2: 97%   Filed Weights   11/25/18 0800  Weight: 215 lb (97.5 kg)    Physical Exam Constitutional:      Appearance: Normal appearance. She is normal weight.  Cardiovascular:     Rate and Rhythm: Normal rate and regular rhythm.     Heart sounds: Normal heart sounds.  Pulmonary:     Effort: Pulmonary effort is normal.     Breath sounds: Normal breath sounds.  Musculoskeletal: Normal range of motion.  Skin:    General: Skin is warm and dry.  Neurological:     Mental Status: She is alert and oriented to person, place, and time. Mental status is at baseline.  Psychiatric:        Mood and Affect: Mood normal.        Behavior: Behavior normal.        Thought Content: Thought content normal.        Judgment: Judgment normal.   Trace edema bilaterally.  Ascites present.   LABORATORY DATA:  I have reviewed the labs as listed.  CBC    Component Value Date/Time   WBC 6.6 11/25/2018 0813   RBC 3.45 (L) 11/25/2018 0813   HGB 10.5 (L) 11/25/2018 0813   HGB 9.6 (L) 11/02/2017 0756   HCT 35.1 (L) 11/25/2018 0813   HCT 21.0 (L) 10/11/2018 0556   HCT 30.6 (L) 11/02/2017 0756   PLT 193  11/25/2018 0813   PLT 150 11/02/2017 0756   PLT 40 (LL) 11/18/2016 1642   MCV 101.7 (H) 11/25/2018 0813   MCV 108.5 (H) 11/02/2017 0756   MCH 30.4 11/25/2018 0813   MCHC 29.9 (L) 11/25/2018 0813   RDW 17.9 (H) 11/25/2018 0813   RDW 17.8 (H) 11/02/2017 0756   LYMPHSABS 0.4 (L) 11/25/2018 0813   LYMPHSABS 0.4 (L) 11/02/2017 0756   MONOABS 0.6 11/25/2018 0813   MONOABS 0.5 11/02/2017 0756   EOSABS 0.2 11/25/2018 0813   EOSABS 0.7 (H) 11/02/2017 0756   EOSABS 0.1 11/18/2016 1642   BASOSABS 0.0 11/25/2018 0813   BASOSABS 0.0 11/02/2017 0756   CMP Latest Ref Rng & Units 11/25/2018 11/16/2018 11/08/2018  Glucose 70 - 99 mg/dL 165(H) 122(H) 125(H)  BUN 8 - 23 mg/dL 31(H) 17 38(H)  Creatinine 0.44 - 1.00 mg/dL 1.93(H) 1.19(H) 2.02(H)  Sodium 135 - 145 mmol/L 136 135 136  Potassium 3.5 - 5.1 mmol/L  4.7 3.9 4.6  Chloride 98 - 111 mmol/L 100 102 101  CO2 22 - 32 mmol/L _0 Calcium 8.9 - 10.3 mg/dL 8.7(L) 8.7(L) 9.1  Total Protein 6.5 - 8.1 g/dL 5.6(L) 5.8(L) 6.1(L)  Total Bilirubin 0.3 - 1.2 mg/dL 1.1 0.6 0.6  Alkaline Phos 38 - 126 U/L 64 60 69  AST 15 - 41 U/L _1 ALT 0 - 44 U/L _2 DIAGNOSTIC IMAGING:  I have independently reviewed the scans and discussed with the patient.   I have reviewed Francene Finders, NP's note and agree with the documentation.  I personally performed a face-to-face visit, made revisions and my assessment and plan is as follows.    ASSESSMENT & PLAN:   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[place]) 1.  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 -Underwent 7 cycles of chemotherapy with carboplatin and paclitaxel from 06/01/2016 through 09/18/2016. -Germline mutation testing on 06/25/2016 was negative. - Underwent robotic assisted tumor debulking surgery on 10/14/2016 by Dr. Denman George. -3 cycles of chemotherapy with carboplatin and paclitaxel from 11/06/2016 through 12/25/2016. - Second line therapy with Doxil and bevacizumab from 05/25/2017 through 07/07/2017. - Third line therapy with gemcitabine from 07/30/2017 through December 2018, gemcitabine and cisplatin day 1 and day 8 q. 21 days from 11/02/2017, 7 cycles completed on 06/14/2018, with CT scan on 07/01/2018 showing progression. - 3 cycles of docetaxel 75 mg/m from 07/16/2018 through 08/26/2018. -CT CAP on 09/13/2018 showed progression of disease.  Malignant ascites present. 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 - Rucaparib 600 mg twice daily started on 09/25/2018. - 7 days after starting rucaparib, she started feeling very weak and nauseous.  Rucaparib was held on 10/05/2018 due to severe weakness.  -She was admitted to the hospital on 10/08/2018  through 10/11/2018 with weakness, and renal failure. -CT abdomen on 10/09/2018 showed stable omental and peritoneal disease. - I have decreased rucaparib to 300 mg twice a day on 10/15/2018. -Rucaparib was further dose reduced to 300 mg once daily on 11/08/2018 secondary to elevated creatinine of 2.0. -She is tolerating the current dose of 300 mg of rucaparib very well.  Her appetite has been good. - Her only lab abnormality was elevated creatinine. -Ca1 25 on 09/06/2018 was 2798.  This has come down to as low as 1898 on 10/08/2018 and went up to 2205 on 11/16/2018. - I  have recommended CT of the abdomen and pelvis with contrast.  Her current creatinine levels may preclude her from getting contrast.  We will decrease her Lasix to 20 mg once a day.  We will check her creatinine next Wednesday when she comes for paracentesis. -I will see her back in 2 weeks for follow-up with repeat blood counts and CT scan.  She was encouraged to eat protein rich foods.  2.  Malignant ascites: -Paracentesis on 07/08/2018, 4 L removed. -Paracentesis on 09/21/2018, 7 L removed. -Paracentesis on 10/11/2018 with 4 L removed. -Paracentesis on 11/11/2018 with 5 L of yellow fluid removed. -She has her next paracentesis set up on 12/01/2018. -She is taking Lasix 20 twice daily.  I have told her to cut back to 20 once a day.  3.  CKD: - Creatinine today increased to 1.9.  Benzapril and metformin are on hold for few weeks.   -She is currently taking Lasix 20 mg twice daily.  I told her to cut it down to once a day. -She will continue potassium half tablet daily and magnesium 1 tablet twice daily.  4.  Diabetes type 2: -She is on insulin pump and her sugars are fairly well controlled.  5.  Right posterior thigh pain: -She has right lower back pain and posterior thigh pain since October 2016 from her back problems. -She takes hydrocodone 10 mg every 4-5 hours.  6.  Peripheral neuropathy: -She has on and off numbness in the  toes.  No numbness in the hands reported.  She was prescribed gabapentin few months ago but she is not taking it.      Orders placed this encounter:  Orders Placed This Encounter  Procedures  . CT Abdomen Pelvis W Contrast  . Basic metabolic panel      Derek Jack, MD [place] [phone]

## 2018-11-25 NOTE — Assessment & Plan Note (Signed)
1.  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 -Underwent 7 cycles of chemotherapy with carboplatin and paclitaxel from 06/01/2016 through 09/18/2016. -Germline mutation testing on 06/25/2016 was negative. - Underwent robotic assisted tumor debulking surgery on 10/14/2016 by Dr. Denman George. -3 cycles of chemotherapy with carboplatin and paclitaxel from 11/06/2016 through 12/25/2016. - Second line therapy with Doxil and bevacizumab from 05/25/2017 through 07/07/2017. - Third line therapy with gemcitabine from 07/30/2017 through December 2018, gemcitabine and cisplatin day 1 and day 8 q. 21 days from 11/02/2017, 7 cycles completed on 06/14/2018, with CT scan on 07/01/2018 showing progression. - 3 cycles of docetaxel 75 mg/m from 07/16/2018 through 08/26/2018. -CT CAP on 09/13/2018 showed progression of disease.  Malignant ascites present. 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 - Rucaparib 600 mg twice daily started on 09/25/2018. - 7 days after starting rucaparib, she started feeling very weak and nauseous.  Rucaparib was held on 10/05/2018 due to severe weakness.  -She was admitted to the hospital on 10/08/2018 through 10/11/2018 with weakness, and renal failure. -CT abdomen on 10/09/2018 showed stable omental and peritoneal disease. - I have decreased rucaparib to 300 mg twice a day on 10/15/2018. -Rucaparib was further dose reduced to 300 mg once daily on 11/08/2018 secondary to elevated creatinine of 2.0. -She is tolerating the current dose of 300 mg of rucaparib very well.  Her appetite has been good. - Her only lab abnormality was elevated creatinine. -Ca1 25 on 09/06/2018 was 2798.  This has come down to as low as 1898 on 10/08/2018 and went up to 2205 on 11/16/2018. - I have recommended CT of the abdomen and pelvis with contrast.  Her current creatinine levels may preclude  her from getting contrast.  We will decrease her Lasix to 20 mg once a day.  We will check her creatinine next Wednesday when she comes for paracentesis. -I will see her back in 2 weeks for follow-up with repeat blood counts and CT scan.  She was encouraged to eat protein rich foods.  2.  Malignant ascites: -Paracentesis on 07/08/2018, 4 L removed. -Paracentesis on 09/21/2018, 7 L removed. -Paracentesis on 10/11/2018 with 4 L removed. -Paracentesis on 11/11/2018 with 5 L of yellow fluid removed. -She has her next paracentesis set up on 12/01/2018. -She is taking Lasix 20 twice daily.  I have told her to cut back to 20 once a day.  3.  CKD: - Creatinine today increased to 1.9.  Benzapril and metformin are on hold for few weeks.   -She is currently taking Lasix 20 mg twice daily.  I told her to cut it down to once a day. -She will continue potassium half tablet daily and magnesium 1 tablet twice daily.  4.  Diabetes type 2: -She is on insulin pump and her sugars are fairly well controlled.  5.  Right posterior thigh pain: -She has right lower back pain and posterior thigh pain since October 2016 from her back problems. -She takes hydrocodone 10 mg every 4-5 hours.  6.  Peripheral neuropathy: -She has on and off numbness in the toes.  No numbness in the hands reported.  She was prescribed gabapentin few months ago but she is not taking it.

## 2018-11-25 NOTE — Patient Instructions (Addendum)
[place] at [facility] Discharge Instructions  Your paracentesis is 12/01/2018 at 9am you will have labs drawn this day as well. You will have CT scans set up for 2 weeks   Thank you for choosing [place] at [facility] to provide your oncology and hematology care.  To afford each patient quality time with our provider, please arrive at least 15 minutes before your scheduled appointment time.   If you have a lab appointment with the [place] please come in thru the  Main Entrance and check in at the main information desk  You need to re-schedule your appointment should you arrive 10 or more minutes late.  We strive to give you quality time with our providers, and arriving late affects you and other patients whose appointments are after yours.  Also, if you no show three or more times for appointments you may be dismissed from the clinic at the providers discretion.     Again, thank you for choosing [facility].  Our hope is that these requests will decrease the amount of time that you wait before being seen by our physicians.       _____________________________________________________________  Should you have questions after your visit to [facility], please contact our office at [phone] between the hours of 8:00 a.m. and 4:30 p.m.  Voicemails left after 4:00 p.m. will not be returned until the following business day.  For prescription refill requests, have your pharmacy contact our office and allow 72 hours.    Cancer Center Support Programs:   > Cancer Support Group  2nd Tuesday of the month 1pm-2pm, Journey Room

## 2018-11-26 ENCOUNTER — Ambulatory Visit (HOSPITAL_COMMUNITY): Payer: Medicare Other | Admitting: Hematology

## 2018-11-26 ENCOUNTER — Other Ambulatory Visit (HOSPITAL_COMMUNITY): Payer: Medicare Other

## 2018-11-26 LAB — CA 125: Cancer Antigen (CA) 125: 1571 U/mL — ABNORMAL HIGH (ref 0.0–38.1)

## 2018-11-28 ENCOUNTER — Observation Stay (HOSPITAL_COMMUNITY)
Admission: EM | Admit: 2018-11-28 | Discharge: 2018-11-29 | Disposition: A | Discharge status: Home / Self Care | Payer: Medicare Other | Attending: Internal Medicine | Admitting: Internal Medicine

## 2018-11-28 ENCOUNTER — Emergency Department (HOSPITAL_COMMUNITY): Payer: Medicare Other

## 2018-11-28 ENCOUNTER — Other Ambulatory Visit: Payer: Self-pay

## 2018-11-28 ENCOUNTER — Encounter (HOSPITAL_COMMUNITY): Payer: Self-pay | Admitting: Emergency Medicine

## 2018-11-28 DIAGNOSIS — E1140 Type 2 diabetes mellitus with diabetic neuropathy, unspecified: Secondary | ICD-10-CM | POA: Insufficient documentation

## 2018-11-28 DIAGNOSIS — E875 Hyperkalemia: Secondary | ICD-10-CM | POA: Diagnosis not present

## 2018-11-28 DIAGNOSIS — R180 Malignant ascites: Secondary | ICD-10-CM | POA: Diagnosis present

## 2018-11-28 DIAGNOSIS — R339 Retention of urine, unspecified: Secondary | ICD-10-CM | POA: Diagnosis present

## 2018-11-28 DIAGNOSIS — E785 Hyperlipidemia, unspecified: Secondary | ICD-10-CM | POA: Insufficient documentation

## 2018-11-28 DIAGNOSIS — R601 Generalized edema: Secondary | ICD-10-CM

## 2018-11-28 DIAGNOSIS — N183 Chronic kidney disease, stage 3 (moderate): Secondary | ICD-10-CM | POA: Diagnosis not present

## 2018-11-28 DIAGNOSIS — Z794 Long term (current) use of insulin: Secondary | ICD-10-CM

## 2018-11-28 DIAGNOSIS — I129 Hypertensive chronic kidney disease with stage 1 through stage 4 chronic kidney disease, or unspecified chronic kidney disease: Secondary | ICD-10-CM | POA: Insufficient documentation

## 2018-11-28 DIAGNOSIS — M79651 Pain in right thigh: Secondary | ICD-10-CM | POA: Diagnosis not present

## 2018-11-28 DIAGNOSIS — E0821 Diabetes mellitus due to underlying condition with diabetic nephropathy: Secondary | ICD-10-CM

## 2018-11-28 DIAGNOSIS — C569 Malignant neoplasm of unspecified ovary: Secondary | ICD-10-CM | POA: Diagnosis not present

## 2018-11-28 DIAGNOSIS — Z79899 Other long term (current) drug therapy: Secondary | ICD-10-CM | POA: Diagnosis not present

## 2018-11-28 DIAGNOSIS — G8929 Other chronic pain: Secondary | ICD-10-CM | POA: Diagnosis not present

## 2018-11-28 LAB — BASIC METABOLIC PANEL
Anion gap: 11 (ref 5–15)
BUN: 40 mg/dL — ABNORMAL HIGH (ref 8–23)
CO2: 25 mmol/L (ref 22–32)
Calcium: 8.7 mg/dL — ABNORMAL LOW (ref 8.9–10.3)
Chloride: 99 mmol/L (ref 98–111)
Creatinine, Ser: 2.16 mg/dL — ABNORMAL HIGH (ref 0.44–1.00)
GFR calc Af Amer: 27 mL/min — ABNORMAL LOW (ref >60)
GFR calc non Af Amer: 23 mL/min — ABNORMAL LOW (ref >60)
Glucose, Bld: 134 mg/dL — ABNORMAL HIGH (ref 70–99)
Potassium: 5.6 mmol/L — ABNORMAL HIGH (ref 3.5–5.1)
Sodium: 135 mmol/L (ref 135–145)

## 2018-11-28 LAB — URINALYSIS, ROUTINE W REFLEX MICROSCOPIC
BILIRUBIN URINE: NEGATIVE
Glucose, UA: NEGATIVE mg/dL
Hgb urine dipstick: NEGATIVE
Ketones, ur: NEGATIVE mg/dL
Leukocytes, UA: NEGATIVE
Nitrite: NEGATIVE
Protein, ur: NEGATIVE mg/dL
Specific Gravity, Urine: 1.02 (ref 1.005–1.030)
pH: 5 (ref 5.0–8.0)

## 2018-11-28 LAB — CBC
HCT: 35.1 % — ABNORMAL LOW (ref 36.0–46.0)
Hemoglobin: 10.7 g/dL — ABNORMAL LOW (ref 12.0–15.0)
MCH: 31.4 pg (ref 26.0–34.0)
MCHC: 30.5 g/dL (ref 30.0–36.0)
MCV: 102.9 fL — AB (ref 80.0–100.0)
Platelets: 220 10*3/uL (ref 150–400)
RBC: 3.41 MIL/uL — ABNORMAL LOW (ref 3.87–5.11)
RDW: 17.8 % — ABNORMAL HIGH (ref 11.5–15.5)
WBC: 7.9 10*3/uL (ref 4.0–10.5)
nRBC: 0 % (ref 0.0–0.2)

## 2018-11-28 LAB — GLUCOSE, CAPILLARY: Glucose-Capillary: 123 mg/dL — ABNORMAL HIGH (ref 70–99)

## 2018-11-28 LAB — CBG MONITORING, ED: Glucose-Capillary: 105 mg/dL — ABNORMAL HIGH (ref 70–99)

## 2018-11-28 MED ORDER — HYDROCODONE-ACETAMINOPHEN 5-325 MG PO TABS
2.0000 | ORAL_TABLET | Freq: Once | ORAL | Status: DC
  Filled 2018-11-28: qty 2

## 2018-11-28 MED ORDER — ATORVASTATIN CALCIUM 40 MG PO TABS
40.0000 mg | ORAL_TABLET | Freq: Every day | ORAL | Status: DC
  Filled 2018-11-28: qty 1

## 2018-11-28 MED ORDER — ONDANSETRON HCL 4 MG/2ML IJ SOLN: 4.0000 mg | Freq: Four times a day (QID) | INTRAMUSCULAR | Status: DC | PRN

## 2018-11-28 MED ORDER — POLYETHYLENE GLYCOL 3350 17 G PO PACK: 17.0000 g | PACK | Freq: Every day | ORAL | Status: DC | PRN

## 2018-11-28 MED ORDER — HYDROCODONE-ACETAMINOPHEN 10-325 MG PO TABS
1.0000 | ORAL_TABLET | ORAL | Status: DC | PRN
  Administered 2018-11-28 – 2018-11-29 (×5): 1 via ORAL
  Filled 2018-11-28 (×5): qty 1

## 2018-11-28 MED ORDER — HYDROCODONE-ACETAMINOPHEN 5-325 MG PO TABS
2.0000 | ORAL_TABLET | Freq: Once | ORAL | Status: AC
  Administered 2018-11-28: 2 via ORAL
  Filled 2018-11-28: qty 2

## 2018-11-28 MED ORDER — INSULIN LISPRO 100 UNIT/ML ~~LOC~~ SOLN: 110.0000 [IU] | SUBCUTANEOUS | Status: DC

## 2018-11-28 MED ORDER — ONDANSETRON HCL 4 MG PO TABS: 4.0000 mg | ORAL_TABLET | Freq: Four times a day (QID) | ORAL | Status: DC | PRN

## 2018-11-28 NOTE — ED Notes (Signed)
Pt and husband frustrated with wait time and wanting to speak to EDP. EDp notified

## 2018-11-28 NOTE — ED Provider Notes (Signed)
[place] Provider Note   CSN: [id number] Arrival date & time: 11/28/18  1107     History   Chief Complaint Chief Complaint  Patient presents with  . Urinary Retention    HPI Ellen Hunt is a 68 y.o. female.  Patient complains of severe swelling to her abdomen.  She states she has to have this drained every once in a while.  She also states that her urine output has decreased.  She saw her doctor Friday who made some changes to help with her creatinine but her urinalysis but has gotten worse  The history is provided by the patient. No language interpreter was used.  Weakness  Severity:  Severe Onset quality:  Gradual Timing:  Constant Progression:  Worsening Chronicity:  Recurrent Context: not alcohol use   Relieved by:  Nothing Worsened by:  Nothing Ineffective treatments:  Lying down Associated symptoms: abdominal pain   Associated symptoms: no chest pain, no cough, no diarrhea, no frequency, no headaches and no seizures     Past Medical History:  Diagnosis Date  . Diabetes mellitus without complication (HCC)    on insulin pump  . Dysrhythmia   . 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 Family history of breast cancer   . GERD (gastroesophageal reflux disease)   . History of blood transfusion   . History of bronchitis   . History of chemotherapy   . History of urinary tract infection   . Hyperlipidemia   . Hypertension   . Low serum vitamin D   . Ovarian cancer ([place]) 05/09/2016  . Shingles     Patient Active Problem List   Diagnosis Date Noted  . Hyperkalemia 10/08/2018  . Severe dehydration 10/08/2018  . Hypermagnesemia 10/08/2018  . Hyperglycemia 10/08/2018  . Insulin pump in place 10/08/2018  . Acute renal failure superimposed on stage 3 chronic kidney disease ([place]) 10/08/2018  . Malignant ascites 07/02/2018  . Drug-induced hypotension 06/11/2018  . CKD (chronic kidney disease), stage III ([place]) 03/08/2018    . Hypomagnesemia 02/08/2018  . Peripheral neuropathy due to chemotherapy ([place]) 11/10/2017  . Elevated liver enzymes 08/25/2017  . Pancytopenia, acquired ([place]) 08/14/2017  . E-coli UTI 08/05/2017  . Thrombocytopenia ([place]) 08/04/2017  . Sepsis secondary to UTI ([place]) 08/04/2017  . AKI (acute kidney injury) ([place]) 08/04/2017  . Goals of care, counseling/discussion 05/14/2017  . Edema leg 12/18/2016  . Influenza with pneumonia 11/20/2016  . Sepsis ([place]) 11/20/2016  . Antineoplastic chemotherapy induced pancytopenia ([place]) 11/20/2016  . HCAP (healthcare-associated pneumonia) 11/19/2016  . Infection of urinary tract 10/14/2016  . B12 deficiency 08/31/2016  . Anemia due to antineoplastic chemotherapy 08/09/2016  . Chemotherapy-induced neuropathy ([place]) 08/07/2016  . Glaucoma 08/07/2016  . Genetic testing 06/27/2016  . Family history of breast cancer   . Shingles 05/09/2016  . 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 Nausea with vomiting 04/30/2016  . Sciatica neuralgia 02/12/2015  . Severe obesity (BMI >= 40) ([place]) 10/12/2014  . Hypertension 09/04/2014  . Obesity 09/04/2014  . Type 2 diabetes mellitus with complication, with long term current use of insulin pump ([place]) 01/25/2014  . Vitamin D insufficiency 01/25/2014  . Hyperlipidemia 01/25/2014  . Osteopenia 01/25/2014    Past Surgical History:  Procedure Laterality Date  . ABDOMINAL HYSTERECTOMY  10/14/2016  . CESAREAN SECTION    . DEBULKING N/A 10/14/2016   Procedure: DEBULKING;  Surgeon: Everitt Amber, MD;  Location: [facility];  Service: Gynecology;  Laterality: N/A;  .  IR PARACENTESIS  06/01/2017  . LAPAROTOMY WITH STAGING N/A 10/14/2016   Procedure: LAPAROTOMY WITH OMENTECTOMY AND TUMOR DEBULGING;  Surgeon: Everitt Amber, MD;  Location: [facility];  Service: Gynecology;  Laterality: N/A;  . LUMBAR FUSION  08/21/15   L3-L4 Dr. Timmothy Euler  . OMENTECTOMY N/A 10/14/2016   Procedure: OMENTECTOMY;  Surgeon: Everitt Amber,  MD;  Location: [facility];  Service: Gynecology;  Laterality: N/A;  . ROBOTIC ASSISTED TOTAL HYSTERECTOMY WITH BILATERAL SALPINGO OOPHERECTOMY Bilateral 10/14/2016   Procedure: XI ROBOTIC ASSISTED TOTAL LAPARSCOPIC  HYSTERECTOMY WITH BILATERAL SALPINGO OOPHORECTOMY;  Surgeon: Everitt Amber, MD;  Location: [facility];  Service: Gynecology;  Laterality: Bilateral;     OB History   No obstetric history on file.      Home Medications    Prior to Admission medications   Medication Sig Start Date End Date Taking? Authorizing Provider  atorvastatin (LIPITOR) 40 MG tablet Take 1 tablet (40 mg total) by mouth daily. 10/07/18  Yes Eustaquio Maize, MD  Biotin 1000 MCG tablet Take 1,000 mcg by mouth daily.    Yes [provider]  furosemide (LASIX) 20 MG tablet Take 1 tablet (20 mg total) by mouth 2 (two) times daily. Patient taking differently: Take 20 mg by mouth daily.  10/22/18  Yes Lockamy, Randi L, NP-C  HYDROcodone-acetaminophen (NORCO) 10-325 MG tablet Take 1 tablet by mouth every 4 (four) hours as needed. 11/16/18  Yes Lockamy, Randi L, NP-C  insulin lispro (HUMALOG) 100 UNIT/ML injection USE IN INSULIN PUMP AS DIRECTED. MAX DAILY DOSE OF 110 UNITS PER DAY. Patient taking differently: Inject 110 Units into the skin See admin instructions. USE IN INSULIN PUMP AS DIRECTED. MAX DAILY DOSE OF 110 UNITS PER DAY. 09/27/18  Yes Philemon Kingdom, MD  lidocaine-prilocaine (EMLA) cream Apply a quarter size amount to port site 1 hour prior to chemo. Do not rub in. Cover with plastic wrap. 12/09/17  Yes Gorsuch, Ni, MD  magnesium oxide (MAG-OX) 400 MG tablet Take 400 mg by mouth 2 (two) times daily.   Yes [provider]  Multiple Vitamin (MULTIVITAMIN WITH MINERALS) TABS Take 1 tablet by mouth daily.   Yes [provider]  omeprazole (PRILOSEC) 40 MG capsule Take 1 capsule (40 mg total) by mouth daily. 09/15/18  Yes Derek Jack, MD  polyethylene glycol (MIRALAX / GLYCOLAX) packet  Take 17 g by mouth daily as needed for mild constipation.    Yes [provider]  potassium chloride SA (K-DUR,KLOR-CON) 20 MEQ tablet Take 1 tablet (20 mEq total) by mouth daily. Patient taking differently: Take 20 mEq by mouth daily. 1/2 tablet once daily 11/01/18  Yes Lockamy, Randi L, NP-C  prochlorperazine (COMPAZINE) 10 MG tablet Take 1 tablet (10 mg total) by mouth every 6 (six) hours as needed for nausea or vomiting. 07/16/18  Yes Gorsuch, Ni, MD  RUBRACA 300 MG tablet TAKE 1 TABLET (300 MG TOTAL) BY MOUTH 2 (TWO) TIMES DAILY. Patient taking differently: Take 300 mg by mouth daily.  11/15/18  Yes Lockamy, Randi L, NP-C  vitamin B-12 (CYANOCOBALAMIN) 1000 MCG tablet Take 1,000 mcg by mouth daily.   Yes [provider]  vitamin E (VITAMIN E) 400 UNIT capsule Take 400 Units by mouth daily.   Yes [provider]  glucose blood (ONE TOUCH ULTRA TEST) test strip Use to test blood sugar 6-8 times daily 11/01/18   Philemon Kingdom, MD    Family History Family History  Problem Relation Age of Onset  .  Lung cancer Mother        smoker; dx in her 77s  . Leukemia Father   . Diabetes Paternal Grandmother   . Heart attack Paternal Grandmother   . Diabetes Paternal Grandfather   . Breast cancer Paternal Aunt        dx in her 54s-30s  . Leukemia Paternal Uncle   . Heart attack Maternal Grandfather   . Breast cancer Cousin        maternal first cousin    Social History Social History   Tobacco Use  . Smoking status: Never Smoker  . Smokeless tobacco: Never Used  Substance Use Topics  . Alcohol use: No  . Drug use: No     Allergies   Avandia [rosiglitazone]; Micronase [glyburide]; and Actos [pioglitazone]   Review of Systems Review of Systems  Constitutional: Negative for appetite change and fatigue.  HENT: Negative for congestion, ear discharge and sinus pressure.   Eyes: Negative for discharge.  Respiratory: Negative for cough.   Cardiovascular:  Negative for chest pain.  Gastrointestinal: Positive for abdominal pain. Negative for diarrhea.  Genitourinary: Negative for frequency and hematuria.  Musculoskeletal: Negative for back pain.  Skin: Negative for rash.  Neurological: Positive for weakness. Negative for seizures and headaches.  Psychiatric/Behavioral: Negative for hallucinations.     Physical Exam Updated Vital Signs BP (!) 128/59   Pulse 98   Temp 98.2 F (36.8 C) (Oral)   Resp 15   Ht 5\' 2"  (1.575 m)   Wt 98.9 kg   SpO2 95%   BMI 39.87 kg/m   Physical Exam Vitals signs and nursing note reviewed.  Constitutional:      Appearance: She is well-developed.  HENT:     Head: Normocephalic.     Nose: Nose normal.  Eyes:     General: No scleral icterus.    Conjunctiva/sclera: Conjunctivae normal.  Neck:     Musculoskeletal: Neck supple.     Thyroid: No thyromegaly.  Cardiovascular:     Rate and Rhythm: Normal rate and regular rhythm.     Heart sounds: No murmur. No friction rub. No gallop.   Pulmonary:     Breath sounds: No stridor. No wheezing or rales.  Chest:     Chest wall: No tenderness.  Abdominal:     General: There is no distension.     Tenderness: There is abdominal tenderness. There is no rebound.     Comments: Patient has severe edema all throughout her abdomen.  Musculoskeletal: Normal range of motion.  Lymphadenopathy:     Cervical: No cervical adenopathy.  Skin:    Findings: No erythema or rash.  Neurological:     Mental Status: She is oriented to person, place, and time.     Motor: No abnormal muscle tone.     Coordination: Coordination normal.  Psychiatric:        Behavior: Behavior normal.      ED Treatments / Results  Labs (all labs ordered are listed, but only abnormal results are displayed) Labs Reviewed  URINALYSIS, ROUTINE W REFLEX MICROSCOPIC - Abnormal; Notable for the following components:      Result Value   APPearance HAZY (*)    All other components within normal  limits  BASIC METABOLIC PANEL - Abnormal; Notable for the following components:   Potassium 5.6 (*)    Glucose, Bld 134 (*)    BUN 40 (*)    Creatinine, Ser 2.16 (*)    Calcium 8.7 (*)  GFR calc non Af Amer 23 (*)    GFR calc Af Amer 27 (*)    All other components within normal limits  CBC - Abnormal; Notable for the following components:   RBC 3.41 (*)    Hemoglobin 10.7 (*)    HCT 35.1 (*)    MCV 102.9 (*)    RDW 17.8 (*)    All other components within normal limits    EKG EKG Interpretation  Date/Time:  Sunday November 28 2018 14:01:14 EST Ventricular Rate:  99 PR Interval:    QRS Duration: 116 QT Interval:  324 QTC Calculation: 416 R Axis:   76 Text Interpretation:  Sinus rhythm Incomplete right bundle branch block Low voltage, precordial leads Confirmed by Milton Ferguson [phone]) on 11/28/2018 5:37:58 PM   Radiology Dg Chest 2 View  Result Date: 11/28/2018 CLINICAL DATA:  Urinary retention.  Shortness of breath. EXAM: CHEST - 2 VIEW COMPARISON:  Body CT 09/13/2018 FINDINGS: Injectable port terminates at the cavoatrial junction. Cardiomediastinal silhouette is normal. Mediastinal contours appear intact. There is no evidence of focal airspace consolidation, pleural effusion or pneumothorax. Osseous structures are without acute abnormality. Soft tissues are grossly normal. IMPRESSION: No active cardiopulmonary disease. Electronically Signed   By: Fidela Salisbury M.D.   On: 11/28/2018 17:38    Procedures Procedures (including critical care time)  Medications Ordered in ED Medications  HYDROcodone-acetaminophen (NORCO/VICODIN) 5-325 MG per tablet 2 tablet (has no administration in time range)     Initial Impression / Assessment and Plan / ED Course  I have reviewed the triage vital signs and the nursing notes.  Pertinent labs & imaging results that were available during my care of the patient were reviewed by me and considered in my medical decision making (see  chart for details).     Patient will be admitted for anasarca and possible paracentesis tomorrow  Final Clinical Impressions(s) / ED Diagnoses   Final diagnoses:  [place]    ED Discharge Orders    None       Milton Ferguson, MD 11/28/18 1840

## 2018-11-28 NOTE — ED Notes (Signed)
Pt reporting pain in back and that she takes vicodin 10/325mg  every 4 as needed. Order approved by Dr Dewayne Hatch

## 2018-11-28 NOTE — ED Notes (Signed)
EDP in to speak w pt

## 2018-11-28 NOTE — H&P (Signed)
History and Physical    Ellen Hunt EVO:[id number] DOB: November 26, 1950 DOA: 11/28/2018  PCP: Eustaquio Maize, MD   Patient coming from: Home  I have personally briefly reviewed patient's old medical records in [place]  Chief Complaint: Abdominal swelling and pain, reduced urinary output  HPI: Ellen Hunt is a 68 y.o. female with medical history significant for High-grade carcinoma of the peritoneum, with malignant ascites, DM2, CKD3, HTN, who presented to the ED with complaints of poor urine output over the past 4 days, and increased abdominal pain and swelling, weight gain.  Patient has been getting intermittent paracentesis since September 2019, for malignant ascites.  Her last paracentesis was 11/11/2018, 5L fluid was removed.  Patient reports weight after was 204, currently today is 216.  She denies difficulty breathing.  No vomiting, no diarrhea.  Appetite has been poor but this is chronic.  She reports she was told to stop her Lasix.  ED Course: Stable vitals.  Creatinine elevated 2.1, from 1.1 12 days ago. Urethral Cath drained 241mls of urine.  Two-view chest x-ray negative for acute abnormality.  EKG sinus rhythm with old incomplete right frontal branch block.  With persistent Abdominal pain and discomfort, hospitalist was called to admit for urgent paracentesis.  Review of Systems: As per HPI all other systems reviewed are negative.  Past Medical History:  Diagnosis Date  . Diabetes mellitus without complication (HCC)    on insulin pump  . Dysrhythmia   . 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 Family history of breast cancer   . GERD (gastroesophageal reflux disease)   . History of blood transfusion   . History of bronchitis   . History of chemotherapy   . History of urinary tract infection   . Hyperlipidemia   . Hypertension   . Low serum vitamin D   . Ovarian cancer ([place]) 05/09/2016  . Shingles     Past Surgical History:  Procedure  Laterality Date  . ABDOMINAL HYSTERECTOMY  10/14/2016  . CESAREAN SECTION    . DEBULKING N/A 10/14/2016   Procedure: DEBULKING;  Surgeon: Everitt Amber, MD;  Location: [facility];  Service: Gynecology;  Laterality: N/A;  . IR PARACENTESIS  06/01/2017  . LAPAROTOMY WITH STAGING N/A 10/14/2016   Procedure: LAPAROTOMY WITH OMENTECTOMY AND TUMOR DEBULGING;  Surgeon: Everitt Amber, MD;  Location: [facility];  Service: Gynecology;  Laterality: N/A;  . LUMBAR FUSION  08/21/15   L3-L4 Dr. Timmothy Euler  . OMENTECTOMY N/A 10/14/2016   Procedure: OMENTECTOMY;  Surgeon: Everitt Amber, MD;  Location: [facility];  Service: Gynecology;  Laterality: N/A;  . ROBOTIC ASSISTED TOTAL HYSTERECTOMY WITH BILATERAL SALPINGO OOPHERECTOMY Bilateral 10/14/2016   Procedure: XI ROBOTIC ASSISTED TOTAL LAPARSCOPIC  HYSTERECTOMY WITH BILATERAL SALPINGO OOPHORECTOMY;  Surgeon: Everitt Amber, MD;  Location: [facility];  Service: Gynecology;  Laterality: Bilateral;     reports that she has never smoked. She has never used smokeless tobacco. She reports that she does not drink alcohol or use drugs.  Allergies  Allergen Reactions  . Avandia [Rosiglitazone] Other (See Comments)    Legs swelled  . Micronase [Glyburide] Swelling  . Actos [Pioglitazone] Other (See Comments)    Edema / leg swelling    Family History  Problem Relation Age of Onset  . Lung cancer Mother        smoker; dx in her 59s  . Leukemia Father   . Diabetes Paternal Grandmother   . Heart attack Paternal Grandmother   . Diabetes  Paternal Grandfather   . Breast cancer Paternal Aunt        dx in her 71s-30s  . Leukemia Paternal Uncle   . Heart attack Maternal Grandfather   . Breast cancer Cousin        maternal first cousin    Prior to Admission medications   Medication Sig Start Date End Date Taking? Authorizing Provider  atorvastatin (LIPITOR) 40 MG tablet Take 1 tablet (40 mg total) by mouth daily. 10/07/18  Yes Eustaquio Maize, MD  Biotin 1000 MCG tablet Take 1,000 mcg  by mouth daily.    Yes [provider]  furosemide (LASIX) 20 MG tablet Take 1 tablet (20 mg total) by mouth 2 (two) times daily. Patient taking differently: Take 20 mg by mouth daily.  10/22/18  Yes Lockamy, Randi L, NP-C  HYDROcodone-acetaminophen (NORCO) 10-325 MG tablet Take 1 tablet by mouth every 4 (four) hours as needed. 11/16/18  Yes Lockamy, Randi L, NP-C  insulin lispro (HUMALOG) 100 UNIT/ML injection USE IN INSULIN PUMP AS DIRECTED. MAX DAILY DOSE OF 110 UNITS PER DAY. Patient taking differently: Inject 110 Units into the skin See admin instructions. USE IN INSULIN PUMP AS DIRECTED. MAX DAILY DOSE OF 110 UNITS PER DAY. 09/27/18  Yes Philemon Kingdom, MD  lidocaine-prilocaine (EMLA) cream Apply a quarter size amount to port site 1 hour prior to chemo. Do not rub in. Cover with plastic wrap. 12/09/17  Yes Gorsuch, Ni, MD  magnesium oxide (MAG-OX) 400 MG tablet Take 400 mg by mouth 2 (two) times daily.   Yes [provider]  Multiple Vitamin (MULTIVITAMIN WITH MINERALS) TABS Take 1 tablet by mouth daily.   Yes [provider]  omeprazole (PRILOSEC) 40 MG capsule Take 1 capsule (40 mg total) by mouth daily. 09/15/18  Yes Derek Jack, MD  polyethylene glycol (MIRALAX / GLYCOLAX) packet Take 17 g by mouth daily as needed for mild constipation.    Yes [provider]  potassium chloride SA (K-DUR,KLOR-CON) 20 MEQ tablet Take 1 tablet (20 mEq total) by mouth daily. Patient taking differently: Take 20 mEq by mouth daily. 1/2 tablet once daily 11/01/18  Yes Lockamy, Randi L, NP-C  prochlorperazine (COMPAZINE) 10 MG tablet Take 1 tablet (10 mg total) by mouth every 6 (six) hours as needed for nausea or vomiting. 07/16/18  Yes Gorsuch, Ni, MD  RUBRACA 300 MG tablet TAKE 1 TABLET (300 MG TOTAL) BY MOUTH 2 (TWO) TIMES DAILY. Patient taking differently: Take 300 mg by mouth daily.  11/15/18  Yes Lockamy, Randi L, NP-C  vitamin B-12 (CYANOCOBALAMIN) 1000 MCG tablet  Take 1,000 mcg by mouth daily.   Yes [provider]  vitamin E (VITAMIN E) 400 UNIT capsule Take 400 Units by mouth daily.   Yes [provider]  glucose blood (ONE TOUCH ULTRA TEST) test strip Use to test blood sugar 6-8 times daily 11/01/18   Philemon Kingdom, MD    Physical Exam: Vitals:   11/28/18 1700 11/28/18 1730 11/28/18 1739 11/28/18 1852  BP: 126/77 (!) 128/59 (!) 128/59 140/71  Pulse: 98 95 98 98  Resp:   15 15  Temp:      TempSrc:      SpO2: 97% 97% 95% 97%  Weight:      Height:        Constitutional: NAD, calm, comfortable Vitals:   11/28/18 1700 11/28/18 1730 11/28/18 1739 11/28/18 1852  BP: 126/77 (!) 128/59 (!) 128/59 140/71  Pulse: 98 95 98 98  Resp:   15 15  Temp:      TempSrc:      SpO2: 97% 97% 95% 97%  Weight:      Height:       Eyes: PERRL, lids and conjunctivae normal ENMT: Mucous membranes are moist. Posterior pharynx clear of any exudate or lesions. Neck: normal, supple, no masses, no thyromegaly Respiratory: clear to auscultation bilaterally, no wheezing, no crackles. Normal respiratory effort. No accessory muscle use.  Cardiovascular: Regular rate and rhythm, no murmurs / rubs / gallops. 1 + pitting pedal edema bilat. 2+ pedal pulses.  Abdomen: Markedly distended and firm, no significant tenderness on palpation no masses palpated. No hepatosplenomegaly. Bowel sounds positive.  Musculoskeletal: no clubbing / cyanosis. No joint deformity upper and lower extremities. Good ROM, no contractures. Normal muscle tone.  Skin: Chronic stasis changes bilateral legs, no ulcers Neurologic: CN 2-12 grossly intact.Strength 5/5 in all 4.  Psychiatric: Normal judgment and insight. Alert and oriented x 3. Normal mood.   Labs on Admission: I have personally reviewed following labs and imaging studies  CBC: Recent Labs  Lab 11/25/18 0813 11/28/18 1223  WBC 6.6 7.9  NEUTROABS 5.5  --   HGB 10.5* 10.7*  HCT 35.1* 35.1*  MCV 101.7* 102.9*    PLT 193 387   Basic Metabolic Panel: Recent Labs  Lab 11/25/18 0813 11/28/18 1223  NA 136 135  K 4.7 5.6*  CL 100 99  CO2 25 25  GLUCOSE 165* 134*  BUN 31* 40*  CREATININE 1.93* 2.16*  CALCIUM 8.7* 8.7*  MG 1.9  --    Liver Function Tests: Recent Labs  Lab 11/25/18 0813  AST 24  ALT 12  ALKPHOS 64  BILITOT 1.1  PROT 5.6*  ALBUMIN 2.9*   CBG: Recent Labs  Lab 11/28/18 1844  GLUCAP 105*   Urine analysis:    Component Value Date/Time   COLORURINE YELLOW 11/28/2018 1430   APPEARANCEUR HAZY (A) 11/28/2018 1430   LABSPEC 1.020 11/28/2018 1430   PHURINE 5.0 11/28/2018 1430   GLUCOSEU NEGATIVE 11/28/2018 1430   HGBUR NEGATIVE 11/28/2018 1430   BILIRUBINUR NEGATIVE 11/28/2018 1430   KETONESUR NEGATIVE 11/28/2018 1430   PROTEINUR NEGATIVE 11/28/2018 1430   NITRITE NEGATIVE 11/28/2018 1430   LEUKOCYTESUR NEGATIVE 11/28/2018 1430    Radiological Exams on Admission: Dg Chest 2 View  Result Date: 11/28/2018 CLINICAL DATA:  Urinary retention.  Shortness of breath. EXAM: CHEST - 2 VIEW COMPARISON:  Body CT 09/13/2018 FINDINGS: Injectable port terminates at the cavoatrial junction. Cardiomediastinal silhouette is normal. Mediastinal contours appear intact. There is no evidence of focal airspace consolidation, pleural effusion or pneumothorax. Osseous structures are without acute abnormality. Soft tissues are grossly normal. IMPRESSION: No active cardiopulmonary disease. Electronically Signed   By: Fidela Salisbury M.D.   On: 11/28/2018 17:38    EKG: Independently reviewed.  Sinus rhythm.  Old incomplete right bundle branch block.  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  Follows with Dr. Delton Coombes.  Currently on chemo.  Has required intermittent paracentesis.  Last paracentesis 11/11/18, has paracentesis planned for 12/01/17, but patient says she cannot with till then  and wants relief of her abdominal discomfort.  -Ultrasound-guided paracentesis in a.m -Patient may bring home chemo medication - Rubraca, to take while admitted  Acute kidney on CKD - Cr 2.16, increased from 1.1, 12 days ago after her last paracentesis.  AKI likely secondary to a  marked ascites, compromising renal perfusion and function.  Suspect improvement after paracentesis. - Paracentesis - Will hold off on lasix.  DM 2-patient on home insulin pump. Ladst Hgba1c from 03/2018- 5.7 - Cont home insulin pump - CBGs AC, HS.  Chronic posterior thigh pain and peripheral neuropathy -Patient specifically requesting that we continue her home pain medications Norco 10/325 Q4 hours, ordered as needed.   DVT prophylaxis: Scds Code Status: Full Family Communication: Spouse at bedside Disposition Plan: 1 day Consults called: None Admission status: Obs, med-surg   Bethena Roys MD [facility]  11/28/2018, 8:32 PM

## 2018-11-28 NOTE — ED Notes (Signed)
Patient has not taken any more of her own meds. scheduled vicodin given.

## 2018-11-28 NOTE — ED Notes (Signed)
Updated family on bed status.

## 2018-11-28 NOTE — ED Notes (Signed)
Initial bladder scan showed 999 ml of urine. Pt with large hard abdomen due to ascites. Only received 250 ml once cath inserted. Post insertion bladder scan shows 221 ml EDPnotifed

## 2018-11-28 NOTE — ED Notes (Signed)
In patients room multiple times to assist with repositioning

## 2018-11-28 NOTE — ED Triage Notes (Signed)
Patient c/o urinary rentention x4 days. Patient admitted in December due to kidney failure. Patient seen by oncologist and told to drink lots water Patient states "I have drank lots of water but none is coming out and now I'm gaining lots of weight." Patient report weight again or 4-5lbs in past 4 days-+1 pitting edema in legs bilaterally. Patient also states some shortness of breath with excretion. Patient to have paracentesisWednesday. Patient being treated for ovarian cancer.

## 2018-11-28 NOTE — ED Notes (Signed)
Pt checked her own blood sugar and states it is 88. Pt reports removing her pump and stated she needs OJ. 120 ml OJ provided

## 2018-11-28 NOTE — ED Notes (Signed)
Family member is upset patient is not in a room upstairs. Threatening to take patient home.

## 2018-11-28 NOTE — ED Notes (Signed)
Pt transported to xray 

## 2018-11-29 ENCOUNTER — Other Ambulatory Visit (HOSPITAL_COMMUNITY): Payer: Self-pay | Admitting: *Deleted

## 2018-11-29 ENCOUNTER — Observation Stay (HOSPITAL_COMMUNITY): Payer: Medicare Other

## 2018-11-29 ENCOUNTER — Other Ambulatory Visit (HOSPITAL_COMMUNITY): Payer: Self-pay | Admitting: Nurse Practitioner

## 2018-11-29 DIAGNOSIS — C569 Malignant neoplasm of unspecified ovary: Secondary | ICD-10-CM

## 2018-11-29 DIAGNOSIS — N183 Chronic kidney disease, stage 3 (moderate): Secondary | ICD-10-CM

## 2018-11-29 DIAGNOSIS — N179 Acute kidney failure, unspecified: Secondary | ICD-10-CM

## 2018-11-29 DIAGNOSIS — Z794 Long term (current) use of insulin: Secondary | ICD-10-CM

## 2018-11-29 DIAGNOSIS — E0821 Diabetes mellitus due to underlying condition with diabetic nephropathy: Secondary | ICD-10-CM

## 2018-11-29 DIAGNOSIS — R180 Malignant ascites: Secondary | ICD-10-CM

## 2018-11-29 LAB — BASIC METABOLIC PANEL
Anion gap: 10 (ref 5–15)
BUN: 42 mg/dL — AB (ref 8–23)
CO2: 24 mmol/L (ref 22–32)
Calcium: 8.5 mg/dL — ABNORMAL LOW (ref 8.9–10.3)
Chloride: 97 mmol/L — ABNORMAL LOW (ref 98–111)
Creatinine, Ser: 2.13 mg/dL — ABNORMAL HIGH (ref 0.44–1.00)
GFR calc Af Amer: 27 mL/min — ABNORMAL LOW (ref >60)
GFR calc non Af Amer: 24 mL/min — ABNORMAL LOW (ref >60)
Glucose, Bld: 171 mg/dL — ABNORMAL HIGH (ref 70–99)
Potassium: 5.2 mmol/L — ABNORMAL HIGH (ref 3.5–5.1)
Sodium: 131 mmol/L — ABNORMAL LOW (ref 135–145)

## 2018-11-29 LAB — GLUCOSE, CAPILLARY
Glucose-Capillary: 122 mg/dL — ABNORMAL HIGH (ref 70–99)
Glucose-Capillary: 145 mg/dL — ABNORMAL HIGH (ref 70–99)
Glucose-Capillary: 172 mg/dL — ABNORMAL HIGH (ref 70–99)
Glucose-Capillary: 103 mg/dL — ABNORMAL HIGH (ref 70–99)

## 2018-11-29 MED ORDER — ALBUMIN HUMAN 25 % IV SOLN
50.0000 g | Freq: Once | INTRAVENOUS | Status: AC
  Administered 2018-11-29: 50 g via INTRAVENOUS

## 2018-11-29 MED ORDER — ALBUMIN HUMAN 25 % IV SOLN
INTRAVENOUS | Status: AC
  Administered 2018-11-29: 50 g via INTRAVENOUS
  Filled 2018-11-29: qty 200

## 2018-11-29 MED ORDER — INSULIN PUMP
Freq: Three times a day (TID) | SUBCUTANEOUS | Status: DC
  Filled 2018-11-29: qty 1

## 2018-11-29 MED ORDER — HEPARIN SOD (PORK) LOCK FLUSH 100 UNIT/ML IV SOLN
500.0000 [IU] | INTRAVENOUS | Status: AC | PRN
  Administered 2018-11-29: 500 [IU]
  Filled 2018-11-29: qty 5

## 2018-11-29 MED ORDER — RUBRACA 300 MG PO TABS: 300.0000 mg | ORAL_TABLET | Freq: Every day | ORAL | 0 refills | Status: DC

## 2018-11-29 MED ORDER — FUROSEMIDE 20 MG PO TABS: 20.0000 mg | ORAL_TABLET | Freq: Every day | ORAL | Status: DC

## 2018-11-29 NOTE — Procedures (Addendum)
PreOperative Dx: Malignant ascites, high grade serous carcinoma of the peritoneum Postoperative Dx: Malignant ascites, high grade serous carcinoma of the peritoneum Procedure:   US guided paracentesis Radiologist:  Thornton Papas Anesthesia:  10 ml of1% lidocaine Specimen:  9 L of yellow ascitic fluid EBL:   < 1 ml Complications: None

## 2018-11-29 NOTE — Discharge Summary (Signed)
Physician Discharge Summary  Ellen Hunt ZOX:[id number] DOB: 01/10/1951 DOA: 11/28/2018  PCP: Eustaquio Maize, MD  Admit date: 11/28/2018 Discharge date: 11/29/2018  Time spent: 30 minutes  Recommendations for Outpatient Follow-up:  1. Repeat basic metabolic panel to follow electrolytes and renal function 2. Repeat CBC to follow hemoglobin trend 3. Reassess volume status and adjust diuretic regimen as needed.   Discharge Diagnoses:  Active Problems:   Ascites, malignant   Malignant neoplasm of ovary (HCC)   Diabetes mellitus due to underlying condition with diabetic nephropathy, with long-term current use of insulin (HCC) Hyperkalemia Acute kidney injury on chronic kidney disease history 3   Discharge Condition: Stable and improved.  Patient discharged home with instructions to follow-up with PCP in 10 days.  Diet recommendation: Low-sodium diet and modify carbohydrates.  Filed Weights   11/28/18 1149 11/28/18 2207 11/29/18 0520  Weight: 98.9 kg 100.1 kg 99.8 kg    History of present illness:  As per H&P written by Dr. Denton Brick on 11/28/2018 68 y.o. female with medical history significant for High-grade carcinoma of the peritoneum, with malignant ascites, DM2, CKD3, HTN, who presented to the ED with complaints of poor urine output over the past 4 days, and increased abdominal pain and swelling, weight gain.  Patient has been getting intermittent paracentesis since September 2019, for malignant ascites.  Her last paracentesis was 11/11/2018, 5L fluid was removed.  Patient reports weight after was 204, currently today is 216.  She denies difficulty breathing.  No vomiting, no diarrhea.  Appetite has been poor but this is chronic.  She reports she was told to stop her Lasix.  ED Course: Stable vitals.  Creatinine elevated 2.1, from 1.1 12 days ago. Urethral Cath drained 214mls of urine.  Two-view chest x-ray negative for acute abnormality.  EKG sinus rhythm with old incomplete right  frontal branch block.  With persistent Abdominal pain and discomfort, hospitalist was called to admit for urgent paracentesis.  Hospital Course:  1-Malignant ascites -Secondary to high-grade serous carcinoma of the peritoneum in the setting of ovarian cancer. -Continue outpatient follow-up with Dr. Delton Coombes -Continue current chemotherapy -Status post paracentesis removing 9 L of ascitic fluid. -Continue pain medication -advise to follow low sodium diet -continue lasix -albumin given during paracentesis   2-acute kidney injury on chronic kidney disease -Stage III at baseline -Acute kidney injury component most likely associated with marked ascites, compromising renal perfusion and overall function. -Creatinine has remained now at 2.1 -Lasix dose has been adjusted -9 L of ascitic fluid removed with paracentesis -Albumin given. -Patient advised to maintain adequate hydration.  3-type 2 diabetes: on insulin pump -continue insulin and outpatient follow up with endocrinologist  -CBG's fairly well controlled.   4-chronic posterior thigh pain and peripheral neuropathy -Continue home analgesic regimen.  5-HLD -Continue statins  6-mild hyperkalemia -Potassium 5.2 at discharge -Potassium supplementation has been discontinued at this moment -Repeat basic metabolic panel follow-up visit.  Procedures:  Paracentesis: 9 L ascitic fluid removed  Consultations:  None  Discharge Exam: Vitals:   11/29/18 1100 11/29/18 1228  BP: 131/66 (!) 119/50  Pulse: 100 100  Resp: 18 18  Temp:    SpO2: 96% 97%    General: Afebrile, no chest pain, no shortness of breath, no nausea, no vomiting.  Feeling significantly improved after having paracentesis with 9 L of fluid removed. Cardiovascular: S1 and S2, no rubs, no gallops; unable to assess for JVD with body habitus. Respiratory: No crackles, no wheezing, normal respiratory effort. Abdomen: Softer,  no guarding, distention is still  appreciated; positive bowel sounds. Extremities: No cyanosis or clubbing; 1-2+ edema bilaterally.   Discharge Instructions   Discharge Instructions    Diet - low sodium heart healthy   Complete by:  As directed    Discharge instructions   Complete by:  As directed    Follow low-sodium diet (2000-2500 mg in 24 hours) Maintain adequate hydration Take medications as prescribed Arrange follow-up with PCP in 10 days Follow-up with Dr. Delton Coombes as previously scheduled. Check weight on daily basis. Arrange recurrent paracentesis visits as instructed by radiology department.     Allergies as of 11/29/2018      Reactions   Avandia [rosiglitazone] Other (See Comments)   Legs swelled   Micronase [glyburide] Swelling   Actos [pioglitazone] Other (See Comments)   Edema / leg swelling      Medication List    STOP taking these medications   potassium chloride SA 20 MEQ tablet Commonly known as:  K-DUR,KLOR-CON     TAKE these medications   atorvastatin 40 MG tablet Commonly known as:  LIPITOR Take 1 tablet (40 mg total) by mouth daily.   Biotin 1000 MCG tablet Take 1,000 mcg by mouth daily.   furosemide 20 MG tablet Commonly known as:  LASIX Take 1 tablet (20 mg total) by mouth daily.   glucose blood test strip Commonly known as:  ONE TOUCH ULTRA TEST Use to test blood sugar 6-8 times daily   HYDROcodone-acetaminophen 10-325 MG tablet Commonly known as:  NORCO Take 1 tablet by mouth every 4 (four) hours as needed.   insulin lispro 100 UNIT/ML injection Commonly known as:  HUMALOG USE IN INSULIN PUMP AS DIRECTED. MAX DAILY DOSE OF 110 UNITS PER DAY. What changed:    how much to take  how to take this  when to take this   lidocaine-prilocaine cream Commonly known as:  EMLA Apply a quarter size amount to port site 1 hour prior to chemo. Do not rub in. Cover with plastic wrap.   magnesium oxide 400 MG tablet Commonly known as:  MAG-OX Take 400 mg by mouth 2  (two) times daily.   multivitamin with minerals Tabs tablet Take 1 tablet by mouth daily.   omeprazole 40 MG capsule Commonly known as:  PRILOSEC Take 1 capsule (40 mg total) by mouth daily.   polyethylene glycol packet Commonly known as:  MIRALAX / GLYCOLAX Take 17 g by mouth daily as needed for mild constipation.   prochlorperazine 10 MG tablet Commonly known as:  COMPAZINE Take 1 tablet (10 mg total) by mouth every 6 (six) hours as needed for nausea or vomiting.   RUBRACA 300 MG tablet Generic drug:  rucaparib camsylate Take 1 tablet (300 mg total) by mouth daily.   vitamin B-12 1000 MCG tablet Commonly known as:  CYANOCOBALAMIN Take 1,000 mcg by mouth daily.   vitamin E 400 UNIT capsule Generic drug:  vitamin E Take 400 Units by mouth daily.      Allergies  Allergen Reactions  . Avandia [Rosiglitazone] Other (See Comments)    Legs swelled  . Micronase [Glyburide] Swelling  . Actos [Pioglitazone] Other (See Comments)    Edema / leg swelling   Follow-up Information    Eustaquio Maize, MD. Schedule an appointment as soon as possible for a visit in 10 day(s).   Specialty:  Pediatrics Contact information: [address] [place] [address] [phone]  The results of significant diagnostics from this hospitalization (including imaging, microbiology, ancillary and laboratory) are listed below for reference.    Significant Diagnostic Studies: Dg Chest 2 View  Result Date: 11/28/2018 CLINICAL DATA:  Urinary retention.  Shortness of breath. EXAM: CHEST - 2 VIEW COMPARISON:  Body CT 09/13/2018 FINDINGS: Injectable port terminates at the cavoatrial junction. Cardiomediastinal silhouette is normal. Mediastinal contours appear intact. There is no evidence of focal airspace consolidation, pleural effusion or pneumothorax. Osseous structures are without acute abnormality. Soft tissues are grossly normal. IMPRESSION: No active cardiopulmonary disease.  Electronically Signed   By: Fidela Salisbury M.D.   On: 11/28/2018 17:38   US Paracentesis  Addendum Date: 11/29/2018   ADDENDUM REPORT: 11/29/2018 14:19 ADDENDUM: Incorrect history. Please correct history to state: High-grade serous carcinoma of the peritoneum, ascites Remainder of report as previously dictated. Electronically Signed   By: Lavonia Dana M.D.   On: 11/29/2018 14:19   Result Date: 11/29/2018 INDICATION: Ovarian cancer, malignant ascites EXAM: ULTRASOUND GUIDED THERAPEUTIC PARACENTESIS MEDICATIONS: None. COMPLICATIONS: None immediate. PROCEDURE: Procedure, benefits, and risks of procedure were discussed with patient. Written informed consent for procedure was obtained. Time out protocol followed. Adequate collection of ascites localized by ultrasound in RIGHT lower quadrant. Skin prepped and draped in usual sterile fashion. Skin and soft tissues anesthetized with 10 mL of 1% lidocaine. 5 [place] Yueh catheter placed into peritoneal cavity. 9 L of yellow ascitic fluid aspirated by vacuum bottle suction. Procedure tolerated well by patient without immediate complication. FINDINGS: As above IMPRESSION: Successful ultrasound-guided paracentesis yielding 9 liters of peritoneal fluid. Electronically Signed: By: Lavonia Dana M.D. On: 11/29/2018 13:50   US Paracentesis  Result Date: 11/11/2018 INDICATION: Recurrent ascites EXAM: ULTRASOUND-GUIDED PARACENTESIS COMPARISON:  Previous paracentesis. MEDICATIONS: 10 cc 1% lidocaine COMPLICATIONS: None immediate. TECHNIQUE: Informed written consent was obtained from the patient after a discussion of the risks, benefits and alternatives to treatment. A timeout was performed prior to the initiation of the procedure. Initial ultrasound scanning demonstrates a large amount of ascites within the right lower abdominal quadrant. The right lower abdomen was prepped and draped in the usual sterile fashion. 1% lidocaine with epinephrine was used for local anesthesia.  Under direct ultrasound guidance, a 19 gauge, 7-cm, Yueh catheter was introduced. An ultrasound image was saved for documentation purposed. The paracentesis was performed. The catheter was removed and a dressing was applied. The patient tolerated the procedure well without immediate post procedural complication. FINDINGS: A total of approximately 5 liters of yellow fluid was removed. Limit per pt-- she says her MD asked to stop at 5 Liters. IMPRESSION: Successful ultrasound-guided paracentesis yielding 5 liters of peritoneal fluid. Read by Lavonia Drafts [facility] Electronically Signed   By: Lavonia Dana M.D.   On: 11/11/2018 10:01    Microbiology: No results found for this or any previous visit (from the past 240 hour(s)).   Labs: Basic Metabolic Panel: Recent Labs  Lab 11/25/18 0813 11/28/18 1223 11/29/18 1349  NA 136 135 131*  K 4.7 5.6* 5.2*  CL 100 99 97*  CO2 25 25 24   GLUCOSE 165* 134* 171*  BUN 31* 40* 42*  CREATININE 1.93* 2.16* 2.13*  CALCIUM 8.7* 8.7* 8.5*  MG 1.9  --   --    Liver Function Tests: Recent Labs  Lab 11/25/18 0813  AST 24  ALT 12  ALKPHOS 64  BILITOT 1.1  PROT 5.6*  ALBUMIN 2.9*   CBC: Recent Labs  Lab 11/25/18 0813 11/28/18 1223  WBC 6.6 7.9  NEUTROABS 5.5  --   HGB 10.5* 10.7*  HCT 35.1* 35.1*  MCV 101.7* 102.9*  PLT 193 220   CBG: Recent Labs  Lab 11/28/18 2246 11/29/18 0254 11/29/18 0735 11/29/18 1310 11/29/18 1616  GLUCAP 123* 122* 145* 172* 103*     Signed:  Barton Dubois MD.  [facility] 11/29/2018, 5:08 PM

## 2018-11-29 NOTE — Progress Notes (Signed)
Inpatient Diabetes Program Recommendations  AACE/ADA: New Consensus Statement on Inpatient Glycemic Control (2015)  Target Ranges:  Prepandial:   less than 140 mg/dL      Peak postprandial:   less than 180 mg/dL (1-2 hours)      Critically ill patients:  140 - 180 mg/dL   Lab Results  Component Value Date   GLUCAP 172 (H) 11/29/2018   HGBA1C 5.1 08/03/2018    Review of Glycemic Control  Diabetes history: DM2 Outpatient Diabetes medications: insulin pump Current orders for Inpatient glycemic control: insulin pump Pump settings: - basal rates: 12 am: 2.35 8 am: 2.20 >> 2.35 5 pm: 2.20 >> 2.35 - ICR: 1:4 >> 1:3.5 - target:              12 am: 115-115              5 am: 100-100             8 pm: 115-115 >> 100-100 - insulin sensitivity factor: 13  Endo - Dr. Cruzita Lederer   Inpatient Diabetes Program Recommendations:     Will follow closely.    NURSING:  The insulin pump contract should be signed by the patient and then placed in the chart (if not already done). The patient insulin pump flow sheet will be completed by the patient at the bedside and the RN caring for the patient will use the patient's flow sheet to document in the [facility]. RN will need to complete the Nursing Insulin Pump Flowsheet at least once a shift. Patient will need to keep extra insulin pump supplies at the bedside at all times. Will continue checking glucose ACHS & 2 am as ordered with the hospital glucometer.  Will call and speak with RN regarding insulin pump.  Pt in Radiology at present.  Thank you. Lorenda Peck, RD, LDN, CDE Inpatient Diabetes Coordinator [phone]

## 2018-11-29 NOTE — Progress Notes (Signed)
Port de-accessed, flushed with 500 units of Heparin prior to D/C. Ellen Hunt catheter removed. D/C instructions given to pt. Verbalized understanding. Pt spouse at bedside to transport home.

## 2018-11-29 NOTE — Care Management Obs Status (Signed)
[place] NOTIFICATION   Patient Details  Name: Ellen Hunt MRN: [id number] Date of Birth: 09/23/1951   Medicare Observation Status Notification Given:  Yes    Shelda Altes 11/29/2018, 9:59 AM

## 2018-11-29 NOTE — Progress Notes (Signed)
Paracentesis complete no signs of distress.  

## 2018-11-29 NOTE — Telephone Encounter (Signed)
Patient is taking Rubraca 300 mg once daily per last office note, new script with correct dosing sent to pharmacy per pharmacy request.

## 2018-11-30 ENCOUNTER — Other Ambulatory Visit (HOSPITAL_COMMUNITY): Payer: Self-pay | Admitting: Emergency Medicine

## 2018-11-30 DIAGNOSIS — C569 Malignant neoplasm of unspecified ovary: Secondary | ICD-10-CM

## 2018-12-01 ENCOUNTER — Ambulatory Visit (HOSPITAL_COMMUNITY): Payer: Medicare Other

## 2018-12-01 ENCOUNTER — Telehealth (HOSPITAL_COMMUNITY): Payer: Self-pay | Admitting: Emergency Medicine

## 2018-12-01 ENCOUNTER — Inpatient Hospital Stay (HOSPITAL_COMMUNITY): Payer: Medicare Other

## 2018-12-01 DIAGNOSIS — D6481 Anemia due to antineoplastic chemotherapy: Secondary | ICD-10-CM

## 2018-12-01 DIAGNOSIS — C569 Malignant neoplasm of unspecified ovary: Secondary | ICD-10-CM

## 2018-12-01 DIAGNOSIS — T451X5A Adverse effect of antineoplastic and immunosuppressive drugs, initial encounter: Principal | ICD-10-CM

## 2018-12-01 DIAGNOSIS — C482 Malignant neoplasm of peritoneum, unspecified: Secondary | ICD-10-CM | POA: Diagnosis not present

## 2018-12-01 DIAGNOSIS — C481 Malignant neoplasm of specified parts of peritoneum: Secondary | ICD-10-CM

## 2018-12-01 LAB — BASIC METABOLIC PANEL
Anion gap: 10 (ref 5–15)
BUN: 32 mg/dL — ABNORMAL HIGH (ref 8–23)
CO2: 26 mmol/L (ref 22–32)
Calcium: 8.5 mg/dL — ABNORMAL LOW (ref 8.9–10.3)
Chloride: 100 mmol/L (ref 98–111)
Creatinine, Ser: 1.57 mg/dL — ABNORMAL HIGH (ref 0.44–1.00)
GFR calc Af Amer: 40 mL/min — ABNORMAL LOW (ref >60)
GFR calc non Af Amer: 34 mL/min — ABNORMAL LOW (ref >60)
GLUCOSE: 121 mg/dL — AB (ref 70–99)
Potassium: 4.3 mmol/L (ref 3.5–5.1)
Sodium: 136 mmol/L (ref 135–145)

## 2018-12-01 LAB — SAMPLE TO BLOOD BANK

## 2018-12-01 LAB — CBC WITH DIFFERENTIAL/PLATELET
Abs Immature Granulocytes: 0.02 10*3/uL (ref 0.00–0.07)
Basophils Absolute: 0 10*3/uL (ref 0.0–0.1)
Basophils Relative: 1 %
Eosinophils Absolute: 0.1 10*3/uL (ref 0.0–0.5)
Eosinophils Relative: 3 %
HEMATOCRIT: 29.4 % — AB (ref 36.0–46.0)
Hemoglobin: 9 g/dL — ABNORMAL LOW (ref 12.0–15.0)
Immature Granulocytes: 0 %
Lymphocytes Relative: 5 %
Lymphs Abs: 0.2 10*3/uL — ABNORMAL LOW (ref 0.7–4.0)
MCH: 31.3 pg (ref 26.0–34.0)
MCHC: 30.6 g/dL (ref 30.0–36.0)
MCV: 102.1 fL — AB (ref 80.0–100.0)
MONO ABS: 0.4 10*3/uL (ref 0.1–1.0)
MONOS PCT: 10 %
Neutro Abs: 3.8 10*3/uL (ref 1.7–7.7)
Neutrophils Relative %: 81 %
Platelets: 157 10*3/uL (ref 150–400)
RBC: 2.88 MIL/uL — ABNORMAL LOW (ref 3.87–5.11)
RDW: 17.2 % — ABNORMAL HIGH (ref 11.5–15.5)
WBC: 4.6 10*3/uL (ref 4.0–10.5)
nRBC: 0 % (ref 0.0–0.2)

## 2018-12-01 NOTE — Telephone Encounter (Signed)
Called pt to let her know that she did not need to have a blood transfusion, hemoglobin is 9.  Verified with Dr Raliegh Ip.  She can remove the blood bracelet.

## 2018-12-06 MED FILL — RUBRACA 300 MG TAB: 300 | 30 days supply | Qty: 30 | Fill #0

## 2018-12-07 ENCOUNTER — Ambulatory Visit: Payer: Medicare Other | Admitting: Internal Medicine

## 2018-12-07 ENCOUNTER — Encounter: Payer: Self-pay | Admitting: Internal Medicine

## 2018-12-07 ENCOUNTER — Encounter (HOSPITAL_COMMUNITY): Payer: Self-pay

## 2018-12-07 VITALS — BP 118/70 | HR 88 | Wt 207.0 lb

## 2018-12-07 DIAGNOSIS — Z9641 Presence of insulin pump (external) (internal): Secondary | ICD-10-CM

## 2018-12-07 DIAGNOSIS — E785 Hyperlipidemia, unspecified: Secondary | ICD-10-CM

## 2018-12-07 DIAGNOSIS — E118 Type 2 diabetes mellitus with unspecified complications: Secondary | ICD-10-CM | POA: Diagnosis not present

## 2018-12-07 LAB — POCT GLYCOSYLATED HEMOGLOBIN (HGB A1C): Hemoglobin A1C: 5.5 % (ref 4.0–5.6)

## 2018-12-07 NOTE — Patient Instructions (Addendum)
Please continue: - Metformin 1000 mg 2x a day  Please use the following pump settings: - basal rates: 12 am: 1.5 >> 1.2 - ICR: 1:3.5, except 1:4 with dinner - target:   12 am: 115-115   5 am: 100-100 >> 115-115  8 pm: 100-100 >> 115-115 - insulin sensitivity factor: 13 >> 20 - Insulin on Board: 4 h  Please return in 3-4 months.

## 2018-12-07 NOTE — Addendum Note (Signed)
Addended by: Cardell Peach I on: 12/07/2018 09:58 AM   Modules accepted: Orders

## 2018-12-07 NOTE — Progress Notes (Signed)
ctosamine   Patient ID: Ellen Hunt, female   DOB: 1951-02-08, 68 y.o.   MRN: [id number]   HPI: Ellen Hunt is a 68 y.o.-year-old female, initially referred by her PCP, Eustaquio Maize, MD, now returning for follow-up for DM2, dx 2002, insulin-dependent, uncontrolled, with complications (peripheral neuropathy, DR, CKD).  Last visit 4 months ago.  She is here with her husband who offers part of the history especially regarding her medical history and insulin doses.  She has a history of ovarian cancer, with extraovarian peritoneal carcinomatosis.  She has malignant ascites and is on chemotherapy.  She had dexamethasone for 4 days with every treatment.  Sugars were usually higher during these treatments. Now on a new ChTx tx (Rubaca) w/o Dexamethasone - for 2 mo.  Since she stopped dexamethasone, sugars improved significantly and she was able to decrease her basal rate.   She was recently admitted in anasarca.  She stopped metformin in 10/2018 because of AKI post chemotherapy with a new agent.  Of note, she has low HbA1c levels, not correlating with her blood sugars at home.  She has had RBC transfusions in the past.  Latest hemoglobin was only 9.0: Lab Results  Component Value Date   HGB 9.0 (L) 12/01/2018   HCT 29.4 (L) 12/01/2018   Last hemoglobin A1c was: Lab Results  Component Value Date   HGBA1C 5.1 08/03/2018   HGBA1C 5.7 (A) 04/02/2018   HGBA1C 5.5 09/29/2017  12/30/2017: HbA1c calculated from fructosamine is 5.9% (does not appear to be accurate as does not correlate with CBGs at home)  She is on insulin pump: -Previously Medtronic 722 - since 2012 -Now on Medtronic 670 G pump without CGM: Since 10/2017.  She has Humalog in the pump  Pump settings: - basal rates: 12 am: 2.35 >> 1.5 - ICR: 1:3.5, except 1:4 with dinner - target:   12 am: 115-115   5 am: 100-100  8 pm: 100-100 - insulin sensitivity factor: 13 - Insulin on Board: 4 h  On dexamethasone: - temporary  basal  12 am: 2.35 >> 2.45 If sugars still high on the temporary basal rate, then decrease the insulin sensitivity factor to 10.  TDD from basal insulin: 51% (54 units) >> 56% >> 69% TDD from bolus insulin:  49% (52 units) >> 44% >> 31% TDD up to 70 units - changes infusion site: Every 3-4 days - Meter: One Touch Ultra 2  Pt checks her sugars 1.4x a day:- ave 206 +/- 70 >> 194 +/- 72 >> 133 +/-43.7 >> 165 +/-17: - am: 129-184, 300 >> 89-153, 173 >> 107-193 - 2h after b'fast: n/c >> 77- 116 >> 183 - before lunch: 135-217 >> 58, 81- 140 >> 68-166, 174 - 2h after lunch: n/c >> 93-129, 160 >> 92-177 - before dinner:93- 206, 312, 340 >> 86-131 >> 67-161 - 2h after dinner:157-274 >> 83-144, 172 >> 102-169 - bedtime:  64, 65, 84- 119, 255, 286 >> 76-165 - nighttime:168, 227, 396 >> 114-159 >> 106-154, 202 Lowest sugar was 93 >> 58 >> 67; she has hypoglycemia awareness in the 70s.  No previous hypoglycemia admission.  She does have a glucagon kit at home.   Higher sugars: 396 >> 315 >> 202  Pt's meals are: - Breakfast: egg + raisin bread + sometimes bacon - Lunch: varies, may eat out; tomato + cottage cheese - Dinner: meat + veggies + starch - Snacks: bananas, oranges, PB, icecream Caffeine-free diet coke, Sprite 0  -+ CKD,  last BUN/creatinine:  Lab Results  Component Value Date   BUN 32 (H) 12/01/2018   BUN 42 (H) 11/29/2018   CREATININE 1.57 (H) 12/01/2018   CREATININE 2.13 (H) 11/29/2018  On benazepril. -+ HL; last set of lipids: Lab Results  Component Value Date   CHOL 128 04/07/2018   HDL 37 (L) 04/07/2018   LDLCALC 53 04/07/2018   LDLDIRECT 57 03/29/2015   TRIG 191 (H) 04/07/2018   CHOLHDL 3.5 04/07/2018  On Lipitor. -+ Numbness and tingling in her feet.  She developed swelling on Neurontin so she is now off. - Last eye exam: Summer 2018: Reportedly + DR, + cataract  ROS: Constitutional: no weight gain/no weight loss, no fatigue, no subjective hyperthermia, no  subjective hypothermia Eyes: no blurry vision, no xerophthalmia ENT: no sore throat, no nodules palpated in neck, no dysphagia, no odynophagia, no hoarseness Cardiovascular: no CP/no SOB/no palpitations/no leg swelling Respiratory: no cough/no SOB/no wheezing Gastrointestinal: no N/no V/no D/no C/no acid reflux Musculoskeletal: no muscle aches/no joint aches Skin: no rashes, no hair loss Neurological: no tremors/+ numbness/+ tingling/no dizziness  I reviewed pt's medications, allergies, PMH, social hx, family hx, and changes were documented in the history of present illness. Otherwise, unchanged from my initial visit note.  Past Medical History:  Diagnosis Date  . Diabetes mellitus without complication (HCC)    on insulin pump  . Dysrhythmia   . 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 Family history of breast cancer   . GERD (gastroesophageal reflux disease)   . History of blood transfusion   . History of bronchitis   . History of chemotherapy   . History of urinary tract infection   . Hyperlipidemia   . Hypertension   . Low serum vitamin D   . Ovarian cancer ([place]) 05/09/2016  . Shingles    Past Surgical History:  Procedure Laterality Date  . ABDOMINAL HYSTERECTOMY  10/14/2016  . CESAREAN SECTION    . DEBULKING N/A 10/14/2016   Procedure: DEBULKING;  Surgeon: Everitt Amber, MD;  Location: [facility];  Service: Gynecology;  Laterality: N/A;  . IR PARACENTESIS  06/01/2017  . LAPAROTOMY WITH STAGING N/A 10/14/2016   Procedure: LAPAROTOMY WITH OMENTECTOMY AND TUMOR DEBULGING;  Surgeon: Everitt Amber, MD;  Location: [facility];  Service: Gynecology;  Laterality: N/A;  . LUMBAR FUSION  08/21/15   L3-L4 Dr. Timmothy Euler  . OMENTECTOMY N/A 10/14/2016   Procedure: OMENTECTOMY;  Surgeon: Everitt Amber, MD;  Location: [facility];  Service: Gynecology;  Laterality: N/A;  . ROBOTIC ASSISTED TOTAL HYSTERECTOMY WITH BILATERAL SALPINGO OOPHERECTOMY Bilateral 10/14/2016   Procedure: XI ROBOTIC  ASSISTED TOTAL LAPARSCOPIC  HYSTERECTOMY WITH BILATERAL SALPINGO OOPHORECTOMY;  Surgeon: Everitt Amber, MD;  Location: [facility];  Service: Gynecology;  Laterality: Bilateral;   Social History   Socioeconomic History  . Marital status: Married    Spouse name: Gershon Mussel  . Number of children: 2  Social Needs  Occupational History  . Occupation: Retired; [employer]  Tobacco Use  . Smoking status: Never Smoker  . Smokeless tobacco: Never Used  Substance and Sexual Activity  . Alcohol use: No  . Drug use: No  . Sexual activity: Not on file    Comment: married   Current Outpatient Medications on File Prior to Visit  Medication Sig Dispense Refill  . atorvastatin (LIPITOR) 40 MG tablet Take 1 tablet (40 mg total) by mouth daily. 90 tablet 3  . Biotin 1000 MCG tablet Take 1,000 mcg by mouth  daily.     . furosemide (LASIX) 20 MG tablet Take 1 tablet (20 mg total) by mouth daily.    Marland Kitchen glucose blood (ONE TOUCH ULTRA TEST) test strip Use to test blood sugar 6-8 times daily 500 each 2  . HYDROcodone-acetaminophen (NORCO) 10-325 MG tablet Take 1 tablet by mouth every 4 (four) hours as needed. 120 tablet 0  . insulin lispro (HUMALOG) 100 UNIT/ML injection USE IN INSULIN PUMP AS DIRECTED. MAX DAILY DOSE OF 110 UNITS PER DAY. (Patient taking differently: Inject 110 Units into the skin See admin instructions. USE IN INSULIN PUMP AS DIRECTED. MAX DAILY DOSE OF 110 UNITS PER DAY.) 110 mL 0  . lidocaine-prilocaine (EMLA) cream Apply a quarter size amount to port site 1 hour prior to chemo. Do not rub in. Cover with plastic wrap. 30 g 3  . magnesium oxide (MAG-OX) 400 MG tablet Take 400 mg by mouth 2 (two) times daily.    . Multiple Vitamin (MULTIVITAMIN WITH MINERALS) TABS Take 1 tablet by mouth daily.    Marland Kitchen omeprazole (PRILOSEC) 40 MG capsule Take 1 capsule (40 mg total) by mouth daily. 90 capsule 3  . polyethylene glycol (MIRALAX / GLYCOLAX) packet Take 17 g by mouth daily as needed for mild constipation.      . prochlorperazine (COMPAZINE) 10 MG tablet Take 1 tablet (10 mg total) by mouth every 6 (six) hours as needed for nausea or vomiting. 60 tablet 9  . RUBRACA 300 MG tablet Take 1 tablet (300 mg total) by mouth daily. 30 tablet 0  . vitamin B-12 (CYANOCOBALAMIN) 1000 MCG tablet Take 1,000 mcg by mouth daily.    . vitamin E (VITAMIN E) 400 UNIT capsule Take 400 Units by mouth daily.     No current facility-administered medications on file prior to visit.    Allergies  Allergen Reactions  . Avandia [Rosiglitazone] Other (See Comments)    Legs swelled  . Micronase [Glyburide] Swelling  . Actos [Pioglitazone] Other (See Comments)    Edema / leg swelling   Family History  Problem Relation Age of Onset  . Lung cancer Mother        smoker; dx in her 15s  . Leukemia Father   . Diabetes Paternal Grandmother   . Heart attack Paternal Grandmother   . Diabetes Paternal Grandfather   . Breast cancer Paternal Aunt        dx in her 27s-30s  . Leukemia Paternal Uncle   . Heart attack Maternal Grandfather   . Breast cancer Cousin        maternal first cousin    PE: BP 118/70   Pulse 88   Wt 207 lb (93.9 kg)   SpO2 98%   BMI 37.86 kg/m  Wt Readings from Last 3 Encounters:  12/07/18 207 lb (93.9 kg)  11/29/18 220 lb 0.3 oz (99.8 kg)  11/25/18 215 lb (97.5 kg)   Constitutional: overweight, in NAD Eyes: PERRLA, EOMI, no exophthalmos ENT: moist mucous membranes, no thyromegaly, no cervical lymphadenopathy Cardiovascular: RRR, No MRG, + bilateral LE swelling Respiratory: CTA B Gastrointestinal: abdomen soft, NT, ND, BS+ Musculoskeletal: no deformities, strength intact in all 4 Skin: moist, warm, no rashes Neurological: no tremor with outstretched hands, DTR normal in all 4  ASSESSMENT: 1. DM1, uncontrolled, with complications - Peripheral neuropathy - CKD - DR  2. HL  PLAN:  1. Patient with longstanding, fairly well-controlled type 2 diabetes, on insulin pump therapy.  Her  sugars are higher during chemotherapy +  dexamethasone.  At last visit, after chemotherapy, sugars improved and they were mostly at goal with only occasional hyperglycemic spikes but several low blood sugars after dinner.  We increased her insulin to carb ratio with dinner at that time.  We continued metformin.  However, this was stopped due to AKI during chemotherapy since then. - Since she stopped dexamethasone, her sugars decrease significantly.  She had to decrease her basal rates as she was dropping her sugars in the hypoglycemic range.  However, despite this, she still has to stop her palm during the night as she continues to drop her sugars overnight.  As a consequence, her sugars are in the 150s to 160s in the morning. -At this visit, we discussed about reducing the basal rates further so she can keep her pump on overnight.  This will lead to better a.m. sugars and subsequently better sugars later in the day. -Reviewing the pump downloads, however, she is not bolusing sufficiently with meals.  He is only bolusing approximately once a day and introduces a low amount of carbs.  Therefore, she is mostly using the insulin pump for the basal rates.  We discussed about the potential of coming off the insulin pump but she would not want to do injections.  She tells me that if she has to switch to injections, her husband has to give them to her.  We decided to keep the pump on for now. -To make it safer for her to use the pump and bolus with every meal I increased her insulin to carb ratio with dinner, increased her ISF, and also increased her CBG target.  Advised her to use the bolus feature for every meal. -I had to supervise her enter the changes in her pump says she was not sure how to do this. -We will keep her off the metformin for now although her kidney function improved. - I suggested:  Patient Instructions  Please continue: - Metformin 1000 mg 2x a day  Please use the following pump settings: -  basal rates: 12 am: 1.5 >> 1.2 - ICR: 1:3.5, except 1:4 with dinner - target:   12 am: 115-115   5 am: 100-100 >> 115-115  8 pm: 100-100 >> 115-115 - insulin sensitivity factor: 13 >> 20 - Insulin on Board: 4 h  Please return in 3-4 months.   - today, HbA1c is 5.5% (higher) - continue checking sugars at different times of the day - check 4x a day, rotating checks - advised for yearly eye exams >> she is UTD - Return to clinic in 3-4 mo with sugar log    2. HL - Reviewed latest lipid panel from 04/2018: LDL at goal, triglycerides high, HDL low Lab Results  Component Value Date   CHOL 128 04/07/2018   HDL 37 (L) 04/07/2018   LDLCALC 53 04/07/2018   LDLDIRECT 57 03/29/2015   TRIG 191 (H) 04/07/2018   CHOLHDL 3.5 04/07/2018  - Continues Lipitor without side effects.  - time spent with the patient: 40 min, of which >50% was spent in reviewing her pump and meter downloads, discussing her hypo- and hyper-glycemic episodes, reviewing previous labs and pump settings and developing a plan to avoid hypo- and hyper-glycemia.   Philemon Kingdom, MD PhD [facility] Endocrinology

## 2018-12-08 ENCOUNTER — Other Ambulatory Visit (HOSPITAL_COMMUNITY): Payer: Self-pay | Admitting: Nurse Practitioner

## 2018-12-08 ENCOUNTER — Encounter (HOSPITAL_COMMUNITY): Payer: Self-pay

## 2018-12-08 ENCOUNTER — Ambulatory Visit (HOSPITAL_COMMUNITY)
Admission: RE | Admit: 2018-12-08 | Discharge: 2018-12-08 | Disposition: A | Discharge status: Home / Self Care | Payer: Medicare Other | Source: Ambulatory Visit | Attending: Nurse Practitioner | Admitting: Nurse Practitioner

## 2018-12-08 DIAGNOSIS — C569 Malignant neoplasm of unspecified ovary: Secondary | ICD-10-CM | POA: Insufficient documentation

## 2018-12-08 MED ORDER — HYDROCODONE-ACETAMINOPHEN 10-325 MG PO TABS: 1.0000 | ORAL_TABLET | ORAL | 0 refills | Status: DC | PRN

## 2018-12-08 NOTE — Progress Notes (Signed)
Paracentesis complete no signs of distress.  

## 2018-12-08 NOTE — Procedures (Signed)
   US guided RLQ paracentesis  5 L yellow fluid obtained Stopped at 5L per pt request. She has been "feeling weak" since this morning.  BP stable Pt in NAD A/O

## 2018-12-09 ENCOUNTER — Ambulatory Visit (HOSPITAL_COMMUNITY): Admission: RE | Admit: 2018-12-09 | Payer: Medicare Other | Source: Ambulatory Visit

## 2018-12-09 ENCOUNTER — Encounter: Payer: Self-pay | Admitting: Internal Medicine

## 2018-12-10 ENCOUNTER — Ambulatory Visit (HOSPITAL_COMMUNITY)
Admission: RE | Admit: 2018-12-10 | Discharge: 2018-12-10 | Disposition: A | Discharge status: Home / Self Care | Payer: Medicare Other | Source: Ambulatory Visit | Attending: Nurse Practitioner | Admitting: Nurse Practitioner

## 2018-12-10 ENCOUNTER — Other Ambulatory Visit (HOSPITAL_COMMUNITY): Payer: Self-pay | Admitting: Nurse Practitioner

## 2018-12-10 DIAGNOSIS — C569 Malignant neoplasm of unspecified ovary: Secondary | ICD-10-CM | POA: Diagnosis present

## 2018-12-10 DIAGNOSIS — C481 Malignant neoplasm of specified parts of peritoneum: Secondary | ICD-10-CM

## 2018-12-14 ENCOUNTER — Other Ambulatory Visit: Payer: Self-pay

## 2018-12-14 ENCOUNTER — Inpatient Hospital Stay (HOSPITAL_COMMUNITY): Payer: Medicare Other | Attending: Hematology | Admitting: Hematology

## 2018-12-14 ENCOUNTER — Encounter (HOSPITAL_COMMUNITY): Payer: Self-pay | Admitting: Hematology

## 2018-12-14 VITALS — BP 101/58 | HR 115 | Temp 98.2°F | Resp 18 | Wt 202.5 lb

## 2018-12-14 DIAGNOSIS — Z806 Family history of leukemia: Secondary | ICD-10-CM | POA: Diagnosis not present

## 2018-12-14 DIAGNOSIS — M79651 Pain in right thigh: Secondary | ICD-10-CM | POA: Insufficient documentation

## 2018-12-14 DIAGNOSIS — R180 Malignant ascites: Secondary | ICD-10-CM | POA: Diagnosis not present

## 2018-12-14 DIAGNOSIS — N189 Chronic kidney disease, unspecified: Secondary | ICD-10-CM | POA: Diagnosis not present

## 2018-12-14 DIAGNOSIS — Z9641 Presence of insulin pump (external) (internal): Secondary | ICD-10-CM | POA: Insufficient documentation

## 2018-12-14 DIAGNOSIS — Z803 Family history of malignant neoplasm of breast: Secondary | ICD-10-CM

## 2018-12-14 DIAGNOSIS — C481 Malignant neoplasm of specified parts of peritoneum: Secondary | ICD-10-CM

## 2018-12-14 DIAGNOSIS — C482 Malignant neoplasm of peritoneum, unspecified: Secondary | ICD-10-CM | POA: Insufficient documentation

## 2018-12-14 DIAGNOSIS — E1122 Type 2 diabetes mellitus with diabetic chronic kidney disease: Secondary | ICD-10-CM | POA: Diagnosis not present

## 2018-12-14 DIAGNOSIS — G629 Polyneuropathy, unspecified: Secondary | ICD-10-CM | POA: Diagnosis not present

## 2018-12-14 DIAGNOSIS — Z801 Family history of malignant neoplasm of trachea, bronchus and lung: Secondary | ICD-10-CM | POA: Insufficient documentation

## 2018-12-14 DIAGNOSIS — Z9221 Personal history of antineoplastic chemotherapy: Secondary | ICD-10-CM | POA: Diagnosis not present

## 2018-12-14 DIAGNOSIS — Z90710 Acquired absence of both cervix and uterus: Secondary | ICD-10-CM

## 2018-12-14 DIAGNOSIS — M545 Low back pain: Secondary | ICD-10-CM | POA: Insufficient documentation

## 2018-12-14 DIAGNOSIS — Z79899 Other long term (current) drug therapy: Secondary | ICD-10-CM | POA: Insufficient documentation

## 2018-12-14 DIAGNOSIS — R5383 Other fatigue: Secondary | ICD-10-CM | POA: Diagnosis not present

## 2018-12-14 DIAGNOSIS — Z90722 Acquired absence of ovaries, bilateral: Secondary | ICD-10-CM | POA: Diagnosis not present

## 2018-12-14 DIAGNOSIS — I129 Hypertensive chronic kidney disease with stage 1 through stage 4 chronic kidney disease, or unspecified chronic kidney disease: Secondary | ICD-10-CM | POA: Insufficient documentation

## 2018-12-14 NOTE — Assessment & Plan Note (Signed)
1.  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 -Underwent 7 cycles of chemotherapy with carboplatin and paclitaxel from 06/01/2016 through 09/18/2016. -Germline mutation testing on 06/25/2016 was negative. - Underwent robotic assisted tumor debulking surgery on 10/14/2016 by Dr. Denman George. -3 cycles of chemotherapy with carboplatin and paclitaxel from 11/06/2016 through 12/25/2016. - Second line therapy with Doxil and bevacizumab from 05/25/2017 through 07/07/2017. - Third line therapy with gemcitabine from 07/30/2017 through December 2018, gemcitabine and cisplatin day 1 and day 8 q. 21 days from 11/02/2017, 7 cycles completed on 06/14/2018, with CT scan on 07/01/2018 showing progression. - 3 cycles of docetaxel 75 mg/m from 07/16/2018 through 08/26/2018. -CT CAP on 09/13/2018 showed progression of disease.  Malignant ascites present. 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 - Rucaparib 600 mg twice daily started on 09/25/2018. - 7 days after starting rucaparib, she started feeling very weak and nauseous.  Rucaparib was held on 10/05/2018 due to severe weakness.  -She was admitted to the hospital on 10/08/2018 through 10/11/2018 with weakness, and renal failure. -CT abdomen on 10/09/2018 showed stable omental and peritoneal disease. - I have decreased rucaparib to 300 mg twice a day on 10/15/2018. -Rucaparib was further dose reduced to 300 mg once daily on 11/08/2018 secondary to elevated creatinine of 2.0. -She is tolerating current dose of rucaparib 300 mg daily very well. -Ca1 25 on 09/06/2018 was 2798.  This is down to 1571 on 11/25/2018. -She was hospitalized on 11/28/2018 through 11/29/2018 with some shortness of breath and had paracentesis done. - We reviewed the results of the CT abdomen and pelvis without contrast dated 12/10/2018.  Extensive caking of the tumor along the  omentum.  Peritoneal implant along the transverse colon measures 5.3 x 3.6 cm, formerly 4.2 x 3.2 cm.  Caking of the tumor along the gastrohepatic ligament and below the stomach is mildly more prominent, with a band of tumor below the stomach measuring 2 cm in thickness, formerly 1.7 cm.  No new areas were seen. -We had a prolonged discussion with the patient about the findings on the CT scan.  There is mild progression on the scans.  However her tumor marker has come down. -We have talked about alternatives which include chemotherapy with Topotecan or pemetrexed.  Based on her functional status, we have decided to continue rucaparib and rescan her in 2 months.  Even though her CT scan showed mild progression, her Ca1 25 level has come down. -We will see her back in 2 weeks for follow-up with labs. 2.  Malignant ascites: -Paracentesis on 07/08/2018, 4 L removed. -Paracentesis on 09/21/2018, 7 L removed. -Paracentesis on 10/11/2018 with 4 L removed. -Paracentesis on 11/11/2018 with 5 L of yellow fluid removed. -Paracentesis on 11/29/2018, with 9 L fluid removed. -Paracentesis on 12/08/2018 with 5 L removed. -She will continue Lasix 20 mg daily.   3.  CKD: - Last creatinine was 1.5.  Benzapril and metformin on hold. -She will continue Lasix 20 mg daily.  Potassium was held during hospitalization.  She will continue magnesium 1 tablet twice daily.  4.  Diabetes type 2: -She is on insulin pump and her sugars are fairly well controlled.  5.  Right posterior thigh pain: -She has right lower back pain and posterior thigh pain since October 2016 from her back problems. -She takes hydrocodone 10 mg every 4-5 hours.  6.  Peripheral neuropathy: -She has on and  off numbness in the toes.  No numbness in the hands reported.  She was prescribed gabapentin few months ago but she is not taking it.

## 2018-12-14 NOTE — Progress Notes (Signed)
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 This may potentially be ovarian or a GI in etiology.    04/30/2016 Tumor Marker    CA 125- 7149.0 (H)    05/01/2016 Procedure    US paracentesis- Successful ultrasound-guided paracentesis yielding 1.8 liters of peritoneal fluid.    05/01/2016 Imaging    US pelvis- Both transabdominal and transvaginal sonography are significantly limited by large patient habitus and ascites. Neither uterus or ovaries were visualized on this exam.    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    05/08/2016 Imaging    CT chest- No evidence of metastatic disease in the chest. Peritoneal/omental disease with abdominal ascites in the upper abdomen, incompletely visualized.     05/13/2016 Procedure    Placement of single lumen port a cath via right internal jugular vein. The catheter tip lies at the cavoatrial junction. A power injectable port a cath was placed and is ready for immediate use.    05/15/2016 Procedure    US Paracentesis- 3400 ml yellow colored ascites removed    05/15/2016 - 09/18/2016 Chemotherapy    Carboplatin/Paclitaxel every 21 days x 7 cycles    07/01/2016 Miscellaneous    Genetic Counseling by [name]-  Genetic testing was normal, and did not reveal a deleterious mutation in these genes.     07/08/2016 Imaging    CT CAP- 1. Small volume ascites, significantly decreased. 2. Stable diffuse omental soft tissue caking and diffuse peritoneal thickening along the bilateral paracolic gutters and bilateral pelvic peritoneal reflections, consistent with peritoneal carcinomatosis. 3. Stable asymmetrically enlarged right ovary, which may represent the primary site of ovarian malignancy. 4. No evidence of metastatic disease in the chest. No new sites of metastatic disease in the abdomen or pelvis.    07/09/2016 Miscellaneous    Gyn Onc re-evaluation- modest response to therapy, 3 more cycles of chemotherapy recommended.      09/11/2016 Imaging    CT C/A/P No significant change omental soft tissue caking, consistent with metastatic disease. Mild ascites is decreased since previous study.  Increased calcification along peritoneal surface in pelvic cul-de-sac, consistent with treated peritoneal metastatic disease.  Stable 4.5cm homogeneous right pelvic mass, which favors a uterine fibroid although right ovarian neoplasm cannot definitely be excluded.  No new or progressive metastatic disease identified. No evidence of metastatic disease within the thorax.     10/14/2016 Procedure    Robotic-assisted laparoscopic total hysterectomy with bilateral salpingoophorectomy, ex lap omentectomy, radical tumor debulking by Dr. Denman George    10/17/2016 Pathology Results    Diagnosis 1. 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 - CERVIX AND ENDOMETRIUM FREE OF TUMOR. - SEE ONCOLOGY TABLE AND COMMENT. 2. Soft  tissue, biopsy, umbilical nodule - HIGH GRADE SEROUS CARCINOMA. 3. Omentum, resection for tumor - HIGH GRADE SEROUS CARCINOMA, 33 CM.    11/06/2016 - 12/25/2016  Chemotherapy    Carboplatin/Paclitaxel x 3 cycles     01/12/2017 Imaging    CT CAP- 1. Interval hysterectomy, bilateral salpingo-oophorectomy and omentectomy without evidence of tumor recurrence. 2. No evidence of metastatic disease. 3. 5 mm nonobstructing lower pole left renal calculus.    01/13/2017 Remission    No evidence of residual disease on CT imaging.    05/04/2017 Imaging    CT CAP- New small amount of ascites within the abdomen and pelvis since 01/12/2017 which could indicate disease progression but no new identifiable tumor and no significant change in omental and mild peritoneal thickening.  No evidence of metastatic disease within the chest.  Coronary artery disease.  Aortic Atherosclerosis (ICD10-I70.0).    05/04/2017 Tumor Marker    Patient's tumor was tested for the following markers: CA 125 Results of the tumor marker test revealed 7654    05/18/2017 Imaging    ECHO; EF 60% -  65    05/25/2017 - 07/07/2017 Chemotherapy    She received Doxil and Avastin. Treatment is stopped due to disease progression    06/01/2017 Procedure    Successful ultrasound-guided therapeutic paracentesis yielding 2.8 liters of peritoneal fluid    06/22/2017 Tumor Marker    Patient's tumor was tested for the following markers: CA 125 Results of the tumor marker test revealed 13440    07/20/2017 Imaging    Mild increase in peritoneal carcinoma within abdomen pelvis since previous study. No significant change and minimal ascites.  No evidence of metastatic disease within the thorax. New mild airspace opacity in left lower lobe, consistent with inflammatory or infectious etiology.    07/30/2017 Tumor Marker    Patient's tumor was tested for the following markers: CA 125 Results of the tumor marker test revealed 12099    07/30/2017 - 06/14/2018 Chemotherapy    The patient had gemzar. Cisplatin is added on 11/02/18    08/03/2017 - 08/05/2017 Hospital Admission    She was admitted to the  hospital for management of UTI and neutropenic fever    08/13/2017 Adverse Reaction    Dose of chemotherapy is reduced due to neutropenic sepsis    08/24/2017 Tumor Marker    Patient's tumor was tested for the following markers: CA 125 Results of the tumor marker test revealed 7431    09/21/2017 Tumor Marker    Patient's tumor was tested for the following markers: CA 125 Results of the tumor marker test revealed 4176    10/09/2017 Imaging    No significant change in peritoneal carcinomatosis since previous study.  No new or progressive metastatic disease within the abdomen or pelvis.  Stable tiny nonobstructive left renal calculus. No evidence of ureteral calculi or hydronephrosis.    11/10/2017 Tumor Marker    Patient's tumor was tested for the following markers: CA 125 Results of the tumor marker test revealed 4257    11/24/2017 Tumor Marker    Patient's tumor was tested for the following markers: CA 125 Results of the tumor marker test revealed 3377    01/12/2018 Tumor Marker    Patient's tumor was tested for the following markers: CA 125 Results of the tumor marker test revealed 1595    01/21/2018 Imaging    1. Stable to slightly improved omental, gastrohepatic ligament and pelvic disease as detailed above. No new/progressive findings. 2. No acute  abdominal or pelvic findings. 3. Stable lower pole left renal calculus.    02/08/2018 Tumor Marker    Patient's tumor was tested for the following markers: CA 125 Results of the tumor marker test revealed 1058    03/08/2018 Tumor Marker    Patient's tumor was tested for the following markers: CA-125 Results of the tumor marker test revealed 737.1    04/02/2018 Tumor Marker    Patient's tumor was tested for the following markers: CA-125 Results of the tumor marker test revealed 542.8    06/14/2018 Tumor Marker    CA 125- 1010    06/30/2018 Imaging    1. Mixed appearance but overall progression, with the left upper quadrant omental  tumor slightly improved but with some potential new deposition of tumor along the hepatic flexure margin. There is also new moderate ascites which must be considered suspicious for malignant ascites/peritoneal spread. 2. Other imaging findings of potential clinical significance: Nonobstructive left nephrolithiasis. Aortic Atherosclerosis (ICD10-I70.0). Mild subcutaneous edema along the sixth anterior abdominal wall potentially from mild panniculitis.    07/08/2018 Procedure    Successful ultrasound-guided paracentesis yielding 4 liters of peritoneal fluid.    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 Chojnowski 68 y.o. female returns for routine follow-up High-grade serous carcinoma of the peritoneum. She is here today with her husband. She has recently been in the hospital. She stopped urinating and had increased abdominal pressure. She had a paracentesis and she was discharged the next day. Denies any nausea, vomiting, or diarrhea. Denies any new pains. Had not noticed any recent bleeding such as epistaxis, hematuria or hematochezia. Denies recent chest pain on exertion, shortness of breath on minimal exertion, pre-syncopal episodes, or palpitations. Denies any numbness or tingling in hands or feet. Denies any recent fevers, infections, or recent hospitalizations. Patient reports appetite at 100% and energy level at 50%.   REVIEW OF SYSTEMS:  Review of Systems  Constitutional: Positive for fatigue.  Gastrointestinal: Positive for abdominal distention.  All other systems reviewed and are  negative.    PAST MEDICAL/SURGICAL HISTORY:  Past Medical History:  Diagnosis Date  . Diabetes mellitus without complication (HCC)    on insulin pump  . Dysrhythmia   . 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 Family history of breast cancer   . GERD (gastroesophageal reflux disease)   . History of blood transfusion   . History of bronchitis   . History of chemotherapy   . History of urinary tract infection   . Hyperlipidemia   . Hypertension   . Low serum vitamin D   . Ovarian cancer ([place]) 05/09/2016  . Shingles    Past Surgical History:  Procedure Laterality Date  . ABDOMINAL HYSTERECTOMY  10/14/2016  . CESAREAN SECTION    . DEBULKING N/A 10/14/2016   Procedure: DEBULKING;  Surgeon: Everitt Amber, MD;  Location: [facility];  Service: Gynecology;  Laterality: N/A;  . IR PARACENTESIS  06/01/2017  . LAPAROTOMY WITH STAGING N/A 10/14/2016   Procedure: LAPAROTOMY WITH OMENTECTOMY AND TUMOR DEBULGING;  Surgeon: Everitt Amber, MD;  Location: [facility];  Service: Gynecology;  Laterality: N/A;  . LUMBAR FUSION  08/21/15   L3-L4 Dr. Timmothy Euler  . OMENTECTOMY N/A 10/14/2016   Procedure: OMENTECTOMY;  Surgeon: Everitt Amber, MD;  Location: [facility];  Service: Gynecology;  Laterality: N/A;  . ROBOTIC ASSISTED TOTAL HYSTERECTOMY WITH BILATERAL SALPINGO OOPHERECTOMY Bilateral 10/14/2016   Procedure: XI ROBOTIC ASSISTED TOTAL LAPARSCOPIC  HYSTERECTOMY WITH BILATERAL SALPINGO OOPHORECTOMY;  Surgeon: Everitt Amber, MD;  Location: [facility];  Service: Gynecology;  Laterality: Bilateral;     SOCIAL HISTORY:  Social History   Socioeconomic History  . Marital status: Married    Spouse name: Gershon Mussel  . Number of children: 2  . Years of education: Not on file  . Highest education level: Not on file  Occupational History  . Occupation: retired  Scientific laboratory technician  . Financial resource strain: Not on file  . Food insecurity:    Worry: Not on file    Inability: Not on file  . Transportation needs:     Medical: Not on file    Non-medical: Not on file  Tobacco Use  . Smoking status: Never Smoker  . Smokeless tobacco: Never Used  Substance and Sexual Activity  . Alcohol use: No  . Drug use: No  . Sexual activity: Not on file    Comment: married  Lifestyle  . Physical activity:    Days per week: Not on file    Minutes per session: Not on file  . Stress: Not on file  Relationships  . Social connections:    Talks on phone: Not on file    Gets together: Not on file    Attends religious service: Not on file    Active member of club or organization: Not on file    Attends meetings of clubs or organizations: Not on file    Relationship status: Not on file  . Intimate partner violence:    Fear of current or ex partner: Not on file    Emotionally abused: Not on file    Physically abused: Not on file    Forced sexual activity: Not on file  Other Topics Concern  . Not on file  Social History Narrative  . Not on file    FAMILY HISTORY:  Family History  Problem Relation Age of Onset  . Lung cancer Mother        smoker; dx in her 51s  . Leukemia Father   . Diabetes Paternal Grandmother   . Heart attack Paternal Grandmother   . Diabetes Paternal Grandfather   . Breast cancer Paternal Aunt        dx in her 3s-30s  . Leukemia Paternal Uncle   . Heart attack Maternal Grandfather   . Breast cancer Cousin        maternal first cousin    CURRENT MEDICATIONS:  Outpatient Encounter Medications as of 12/14/2018  Medication Sig  . atorvastatin (LIPITOR) 40 MG tablet Take 1 tablet (40 mg total) by mouth daily.  . Biotin 1000 MCG tablet Take 1,000 mcg by mouth daily.   . furosemide (LASIX) 20 MG tablet Take 1 tablet (20 mg total) by mouth daily.  Marland Kitchen glucose blood (ONE TOUCH ULTRA TEST) test strip Use to test blood sugar 6-8 times daily  . HYDROcodone-acetaminophen (NORCO) 10-325 MG tablet Take 1 tablet by mouth every 4 (four) hours as needed.  . insulin lispro (HUMALOG) 100 UNIT/ML  injection USE IN INSULIN PUMP AS DIRECTED. MAX DAILY DOSE OF 110 UNITS  PER DAY. (Patient taking differently: Inject 110 Units into the skin See admin instructions. USE IN INSULIN PUMP AS DIRECTED. MAX DAILY DOSE OF 110 UNITS PER DAY.)  . lidocaine-prilocaine (EMLA) cream Apply a quarter size amount to port site 1 hour prior to chemo. Do not rub in. Cover with plastic wrap.  . magnesium oxide (MAG-OX) 400 MG tablet Take 400 mg by mouth 2 (two) times daily.  . Multiple Vitamin (MULTIVITAMIN WITH MINERALS) TABS Take 1 tablet by mouth daily.  Marland Kitchen omeprazole (PRILOSEC) 40 MG capsule Take 1 capsule (40 mg total) by mouth daily.  . polyethylene glycol (MIRALAX / GLYCOLAX) packet Take 17 g by mouth daily as needed for mild constipation.   . prochlorperazine (COMPAZINE) 10 MG tablet Take 1 tablet (10 mg total) by mouth every 6 (six) hours as needed for nausea or vomiting.  . RUBRACA 300 MG tablet Take 1 tablet (300 mg total) by mouth daily.  . vitamin B-12 (CYANOCOBALAMIN) 1000 MCG tablet Take 1,000 mcg by mouth daily.  . vitamin E (VITAMIN E) 400 UNIT capsule Take 400 Units by mouth daily.   No facility-administered encounter medications on file as of 12/14/2018.     ALLERGIES:  Allergies  Allergen Reactions  . Avandia [Rosiglitazone] Other (See Comments)    Legs swelled  . Micronase [Glyburide] Swelling  . Actos [Pioglitazone] Other (See Comments)    Edema / leg swelling     PHYSICAL EXAM:  ECOG Performance status: 1  Vitals:   12/14/18 0800  BP: (!) 101/58  Pulse: (!) 115  Resp: 18  Temp: 98.2 F (36.8 C)  SpO2: 98%   Filed Weights   12/14/18 0800  Weight: 202 lb 8 oz (91.9 kg)    Physical Exam Constitutional:      Appearance: Normal appearance. She is normal weight.  Abdominal:     General: There is distension.  Musculoskeletal: Normal range of motion.  Skin:    General: Skin is warm and dry.  Neurological:     Mental Status: She is alert and oriented to person, place, and  time. Mental status is at baseline.  Psychiatric:        Mood and Affect: Mood normal.        Behavior: Behavior normal.        Thought Content: Thought content normal.        Judgment: Judgment normal.   Chest: Bilateral clear to auscultation CVS: S1-S2 regular rate and rhythm   LABORATORY DATA:  I have reviewed the labs as listed.  CBC    Component Value Date/Time   WBC 4.6 12/01/2018 0816   RBC 2.88 (L) 12/01/2018 0816   HGB 9.0 (L) 12/01/2018 0816   HGB 9.6 (L) 11/02/2017 0756   HCT 29.4 (L) 12/01/2018 0816   HCT 21.0 (L) 10/11/2018 0556   HCT 30.6 (L) 11/02/2017 0756   PLT 157 12/01/2018 0816   PLT 150 11/02/2017 0756   PLT 40 (LL) 11/18/2016 1642   MCV 102.1 (H) 12/01/2018 0816   MCV 108.5 (H) 11/02/2017 0756   MCH 31.3 12/01/2018 0816   MCHC 30.6 12/01/2018 0816   RDW 17.2 (H) 12/01/2018 0816   RDW 17.8 (H) 11/02/2017 0756   LYMPHSABS 0.2 (L) 12/01/2018 0816   LYMPHSABS 0.4 (L) 11/02/2017 0756   MONOABS 0.4 12/01/2018 0816   MONOABS 0.5 11/02/2017 0756   EOSABS 0.1 12/01/2018 0816   EOSABS 0.7 (H) 11/02/2017 0756   EOSABS 0.1 11/18/2016 1642   BASOSABS 0.0 12/01/2018  0816   BASOSABS 0.0 11/02/2017 0756   CMP Latest Ref Rng & Units 12/01/2018 11/29/2018 11/28/2018  Glucose 70 - 99 mg/dL 121(H) 171(H) 134(H)  BUN 8 - 23 mg/dL 32(H) 42(H) 40(H)  Creatinine 0.44 - 1.00 mg/dL 1.57(H) 2.13(H) 2.16(H)  Sodium 135 - 145 mmol/L 136 131(L) 135  Potassium 3.5 - 5.1 mmol/L 4.3 5.2(H) 5.6(H)  Chloride 98 - 111 mmol/L 100 97(L) 99  CO2 22 - 32 mmol/L _0 Calcium 8.9 - 10.3 mg/dL 8.5(L) 8.5(L) 8.7(L)  Total Protein 6.5 - 8.1 g/dL - - -  Total Bilirubin 0.3 - 1.2 mg/dL - - -  Alkaline Phos 38 - 126 U/L - - -  AST 15 - 41 U/L - - -  ALT 0 - 44 U/L - - -       DIAGNOSTIC IMAGING:  I have independently reviewed the scans and discussed with the patient.   I have reviewed Francene Finders, NP's note and agree with the documentation.  I personally performed a  face-to-face visit, made revisions and my assessment and plan is as follows.    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 -Underwent 7 cycles of chemotherapy with carboplatin and paclitaxel from 06/01/2016 through 09/18/2016. -Germline mutation testing on 06/25/2016 was negative. - Underwent robotic assisted tumor debulking surgery on 10/14/2016 by Dr. Denman George. -3 cycles of chemotherapy with carboplatin and paclitaxel from 11/06/2016 through 12/25/2016. - Second line therapy with Doxil and bevacizumab from 05/25/2017 through 07/07/2017. - Third line therapy with gemcitabine from 07/30/2017 through December 2018, gemcitabine and cisplatin day 1 and day 8 q. 21 days from 11/02/2017, 7 cycles completed on 06/14/2018, with CT scan on 07/01/2018 showing progression. - 3 cycles of docetaxel 75 mg/m from 07/16/2018 through 08/26/2018. -CT CAP on 09/13/2018 showed progression of disease.  Malignant ascites present. 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 - Rucaparib 600 mg twice daily started on 09/25/2018. - 7 days after starting rucaparib, she started feeling very weak and nauseous.  Rucaparib was held on 10/05/2018 due to severe weakness.  -She was admitted to the hospital on 10/08/2018 through 10/11/2018 with weakness, and renal failure. -CT abdomen on 10/09/2018 showed stable omental and peritoneal disease. - I have decreased rucaparib to 300 mg twice a day on 10/15/2018. -Rucaparib was further dose reduced to 300 mg once daily on 11/08/2018 secondary to elevated creatinine of 2.0. -She is tolerating current dose of rucaparib 300 mg daily very well. -Ca1 25 on 09/06/2018 was 2798.  This is down to 1571 on 11/25/2018. -She was hospitalized on 11/28/2018 through 11/29/2018 with some shortness of breath and  had paracentesis done. - We reviewed the results of the CT abdomen and pelvis without contrast dated 12/10/2018.  Extensive caking of the tumor along the omentum.  Peritoneal implant along the transverse colon measures 5.3 x 3.6 cm, formerly 4.2 x 3.2 cm.  Caking of the tumor along the gastrohepatic ligament and below the stomach is mildly more prominent, with a band of tumor below the stomach measuring 2 cm in thickness, formerly 1.7 cm.  No new areas were seen. -We had a prolonged discussion with the patient about the findings on the CT scan.  There is mild progression on the scans.  However her tumor marker has come down. -We have talked about alternatives which include  chemotherapy with Topotecan or pemetrexed.  Based on her functional status, we have decided to continue rucaparib and rescan her in 2 months.  Even though her CT scan showed mild progression, her Ca1 25 level has come down. -We will see her back in 2 weeks for follow-up with labs. 2.  Malignant ascites: -Paracentesis on 07/08/2018, 4 L removed. -Paracentesis on 09/21/2018, 7 L removed. -Paracentesis on 10/11/2018 with 4 L removed. -Paracentesis on 11/11/2018 with 5 L of yellow fluid removed. -Paracentesis on 11/29/2018, with 9 L fluid removed. -Paracentesis on 12/08/2018 with 5 L removed. -She will continue Lasix 20 mg daily.   3.  CKD: - Last creatinine was 1.5.  Benzapril and metformin on hold. -She will continue Lasix 20 mg daily.  Potassium was held during hospitalization.  She will continue magnesium 1 tablet twice daily.  4.  Diabetes type 2: -She is on insulin pump and her sugars are fairly well controlled.  5.  Right posterior thigh pain: -She has right lower back pain and posterior thigh pain since October 2016 from her back problems. -She takes hydrocodone 10 mg every 4-5 hours.  6.  Peripheral neuropathy: -She has on and off numbness in the toes.  No numbness in the hands reported.  She was prescribed gabapentin few  months ago but she is not taking it.      Orders placed this encounter:  Orders Placed This Encounter  Procedures  . CA 125  . Magnesium  . CBC with Differential/Platelet  . Comprehensive metabolic panel  . Lactate dehydrogenase      Derek Jack, MD [place] [phone]

## 2018-12-14 NOTE — Patient Instructions (Signed)
[facility] at [facility] Discharge Instructions     Thank you for choosing [facility] at [facility] to provide your oncology and hematology care.  To afford each patient quality time with our provider, please arrive at least 15 minutes before your scheduled appointment time.   If you have a lab appointment with the Cancer Center please come in thru the  Main Entrance and check in at the main information desk  You need to re-schedule your appointment should you arrive 10 or more minutes late.  We strive to give you quality time with our providers, and arriving late affects you and other patients whose appointments are after yours.  Also, if you no show three or more times for appointments you may be dismissed from the clinic at the providers discretion.     Again, thank you for choosing [facility].  Our hope is that these requests will decrease the amount of time that you wait before being seen by our physicians.       _____________________________________________________________  Should you have questions after your visit to [facility], please contact our office at [phone] between the hours of 8:00 a.m. and 4:30 p.m.  Voicemails left after 4:00 p.m. will not be returned until the following business day.  For prescription refill requests, have your pharmacy contact our office and allow 72 hours.    Cancer Center Support Programs:   > Cancer Support Group  2nd Tuesday of the month 1pm-2pm, Journey Room    

## 2018-12-17 ENCOUNTER — Encounter (HOSPITAL_COMMUNITY): Payer: Self-pay

## 2018-12-17 ENCOUNTER — Ambulatory Visit (HOSPITAL_COMMUNITY)
Admission: RE | Admit: 2018-12-17 | Discharge: 2018-12-17 | Disposition: A | Discharge status: Home / Self Care | Payer: Medicare Other | Source: Ambulatory Visit | Attending: Nurse Practitioner | Admitting: Nurse Practitioner

## 2018-12-17 DIAGNOSIS — C569 Malignant neoplasm of unspecified ovary: Secondary | ICD-10-CM | POA: Insufficient documentation

## 2018-12-17 MED ORDER — LIDOCAINE HCL (PF) 2 % IJ SOLN
INTRAMUSCULAR | Status: AC
  Filled 2018-12-17: qty 10

## 2018-12-17 NOTE — Procedures (Signed)
PreOperative Dx: Malignant ascites Postoperative Dx: Malignant ascites Procedure:   US guided paracentesis Radiologist:  Thornton Papas Anesthesia:  13 ml of1% lidocaine Specimen:  6 L of yellow ascitic fluid EBL:   < 1 ml Complications: None

## 2018-12-17 NOTE — Progress Notes (Signed)
Paracentesis complete no signs of distress.  

## 2018-12-18 ENCOUNTER — Other Ambulatory Visit (HOSPITAL_COMMUNITY): Payer: Self-pay | Admitting: Nurse Practitioner

## 2018-12-27 ENCOUNTER — Ambulatory Visit (HOSPITAL_COMMUNITY)
Admission: RE | Admit: 2018-12-27 | Discharge: 2018-12-27 | Disposition: A | Discharge status: Home / Self Care | Payer: Medicare Other | Source: Ambulatory Visit | Attending: Nurse Practitioner | Admitting: Nurse Practitioner

## 2018-12-27 ENCOUNTER — Encounter (HOSPITAL_COMMUNITY): Payer: Self-pay

## 2018-12-27 DIAGNOSIS — C569 Malignant neoplasm of unspecified ovary: Secondary | ICD-10-CM | POA: Insufficient documentation

## 2018-12-27 NOTE — Progress Notes (Signed)
Paracentesis complete no signs of distress.  

## 2018-12-30 ENCOUNTER — Other Ambulatory Visit (HOSPITAL_COMMUNITY): Payer: Self-pay | Admitting: Hematology

## 2018-12-30 DIAGNOSIS — C569 Malignant neoplasm of unspecified ovary: Secondary | ICD-10-CM

## 2018-12-31 ENCOUNTER — Ambulatory Visit (HOSPITAL_COMMUNITY)
Admission: RE | Admit: 2018-12-31 | Discharge: 2018-12-31 | Disposition: A | Discharge status: Home / Self Care | Payer: Medicare Other | Source: Ambulatory Visit | Attending: Nurse Practitioner | Admitting: Nurse Practitioner

## 2018-12-31 ENCOUNTER — Other Ambulatory Visit (HOSPITAL_COMMUNITY): Payer: Medicare Other

## 2018-12-31 DIAGNOSIS — C569 Malignant neoplasm of unspecified ovary: Secondary | ICD-10-CM | POA: Insufficient documentation

## 2018-12-31 DIAGNOSIS — C481 Malignant neoplasm of specified parts of peritoneum: Secondary | ICD-10-CM | POA: Insufficient documentation

## 2018-12-31 LAB — CBC WITH DIFFERENTIAL/PLATELET
Abs Immature Granulocytes: 0.04 10*3/uL (ref 0.00–0.07)
BASOS ABS: 0 10*3/uL (ref 0.0–0.1)
Basophils Relative: 0 %
Eosinophils Absolute: 0.1 10*3/uL (ref 0.0–0.5)
Eosinophils Relative: 1 %
HEMATOCRIT: 31.7 % — AB (ref 36.0–46.0)
Hemoglobin: 9.7 g/dL — ABNORMAL LOW (ref 12.0–15.0)
Immature Granulocytes: 1 %
LYMPHS ABS: 0.3 10*3/uL — AB (ref 0.7–4.0)
Lymphocytes Relative: 4 %
MCH: 32 pg (ref 26.0–34.0)
MCHC: 30.6 g/dL (ref 30.0–36.0)
MCV: 104.6 fL — ABNORMAL HIGH (ref 80.0–100.0)
Monocytes Absolute: 0.6 10*3/uL (ref 0.1–1.0)
Monocytes Relative: 8 %
Neutro Abs: 6.5 10*3/uL (ref 1.7–7.7)
Neutrophils Relative %: 86 %
Platelets: 95 10*3/uL — ABNORMAL LOW (ref 150–400)
RBC: 3.03 MIL/uL — ABNORMAL LOW (ref 3.87–5.11)
RDW: 17.5 % — ABNORMAL HIGH (ref 11.5–15.5)
WBC: 7.5 10*3/uL (ref 4.0–10.5)
nRBC: 0 % (ref 0.0–0.2)

## 2018-12-31 LAB — COMPREHENSIVE METABOLIC PANEL
ALT: 12 U/L (ref 0–44)
AST: 29 U/L (ref 15–41)
Albumin: 2.2 g/dL — ABNORMAL LOW (ref 3.5–5.0)
Alkaline Phosphatase: 75 U/L (ref 38–126)
Anion gap: 15 (ref 5–15)
BUN: 51 mg/dL — ABNORMAL HIGH (ref 8–23)
CO2: 20 mmol/L — ABNORMAL LOW (ref 22–32)
Calcium: 8.2 mg/dL — ABNORMAL LOW (ref 8.9–10.3)
Chloride: 94 mmol/L — ABNORMAL LOW (ref 98–111)
Creatinine, Ser: 2.26 mg/dL — ABNORMAL HIGH (ref 0.44–1.00)
GFR calc Af Amer: 26 mL/min — ABNORMAL LOW (ref >60)
GFR, EST NON AFRICAN AMERICAN: 22 mL/min — AB (ref >60)
Glucose, Bld: 159 mg/dL — ABNORMAL HIGH (ref 70–99)
Potassium: 5.9 mmol/L — ABNORMAL HIGH (ref 3.5–5.1)
Sodium: 129 mmol/L — ABNORMAL LOW (ref 135–145)
Total Bilirubin: 0.8 mg/dL (ref 0.3–1.2)
Total Protein: 5.4 g/dL — ABNORMAL LOW (ref 6.5–8.1)

## 2018-12-31 LAB — MAGNESIUM: Magnesium: 2.5 mg/dL — ABNORMAL HIGH (ref 1.7–2.4)

## 2018-12-31 LAB — LACTATE DEHYDROGENASE: LDH: 236 U/L — ABNORMAL HIGH (ref 98–192)

## 2018-12-31 MED ORDER — ALBUMIN HUMAN 25 % IV SOLN
INTRAVENOUS | Status: AC
  Administered 2018-12-31: 50 g via INTRAVENOUS
  Filled 2018-12-31: qty 300

## 2018-12-31 MED ORDER — ALBUMIN HUMAN 25 % IV SOLN
50.0000 g | Freq: Once | INTRAVENOUS | Status: AC
  Administered 2018-12-31: 50 g via INTRAVENOUS

## 2018-12-31 NOTE — Progress Notes (Signed)
Paracentesis Procedure Note Indications: malignant ascites  Procedure Details  Consent: Informed consent was obtained. Risks of the procedure were discussed including: infection, bleeding, pain, bowel perforation.  Maximum sterile technique was used including antiseptics, gloves, hand hygiene and sheet. Skin prep: Chlorhexidine; local anesthetic administered. The abdominal wall was punctured in the right lower quadrant . Fluid was obtained without any difficulties and minimal blood loss.  A dressing was applied to the wound and wound care instructions were provided.   Findings  8265ml of dark amber ascitic fluid was obtained. A sample was sent to Pathology for cytology and cell counts, as well as for infection analysis.  Complications:  None; patient tolerated the procedure well.        Condition: stable

## 2019-01-01 LAB — CA 125: Cancer Antigen (CA) 125: 1613 U/mL — ABNORMAL HIGH (ref 0.0–38.1)

## 2019-01-03 ENCOUNTER — Other Ambulatory Visit (HOSPITAL_COMMUNITY): Payer: Self-pay | Admitting: Nurse Practitioner

## 2019-01-04 ENCOUNTER — Inpatient Hospital Stay (HOSPITAL_COMMUNITY): Payer: Medicare Other

## 2019-01-04 ENCOUNTER — Encounter (HOSPITAL_COMMUNITY): Payer: Self-pay | Admitting: Hematology

## 2019-01-04 ENCOUNTER — Inpatient Hospital Stay (HOSPITAL_COMMUNITY): Payer: Medicare Other | Attending: Hematology | Admitting: Hematology

## 2019-01-04 ENCOUNTER — Encounter (HOSPITAL_COMMUNITY): Payer: Self-pay

## 2019-01-04 VITALS — BP 82/54 | HR 126 | Resp 20 | Wt 179.4 lb

## 2019-01-04 DIAGNOSIS — E875 Hyperkalemia: Secondary | ICD-10-CM | POA: Insufficient documentation

## 2019-01-04 DIAGNOSIS — R600 Localized edema: Secondary | ICD-10-CM | POA: Diagnosis not present

## 2019-01-04 DIAGNOSIS — K219 Gastro-esophageal reflux disease without esophagitis: Secondary | ICD-10-CM | POA: Diagnosis not present

## 2019-01-04 DIAGNOSIS — Z90722 Acquired absence of ovaries, bilateral: Secondary | ICD-10-CM | POA: Diagnosis not present

## 2019-01-04 DIAGNOSIS — Z806 Family history of leukemia: Secondary | ICD-10-CM | POA: Diagnosis not present

## 2019-01-04 DIAGNOSIS — R0602 Shortness of breath: Secondary | ICD-10-CM | POA: Diagnosis not present

## 2019-01-04 DIAGNOSIS — E871 Hypo-osmolality and hyponatremia: Secondary | ICD-10-CM | POA: Insufficient documentation

## 2019-01-04 DIAGNOSIS — C569 Malignant neoplasm of unspecified ovary: Secondary | ICD-10-CM

## 2019-01-04 DIAGNOSIS — Z803 Family history of malignant neoplasm of breast: Secondary | ICD-10-CM | POA: Diagnosis not present

## 2019-01-04 DIAGNOSIS — Z79899 Other long term (current) drug therapy: Secondary | ICD-10-CM | POA: Insufficient documentation

## 2019-01-04 DIAGNOSIS — C481 Malignant neoplasm of specified parts of peritoneum: Secondary | ICD-10-CM

## 2019-01-04 DIAGNOSIS — Z90710 Acquired absence of both cervix and uterus: Secondary | ICD-10-CM | POA: Insufficient documentation

## 2019-01-04 DIAGNOSIS — G629 Polyneuropathy, unspecified: Secondary | ICD-10-CM | POA: Diagnosis not present

## 2019-01-04 DIAGNOSIS — E1122 Type 2 diabetes mellitus with diabetic chronic kidney disease: Secondary | ICD-10-CM | POA: Insufficient documentation

## 2019-01-04 DIAGNOSIS — Z9221 Personal history of antineoplastic chemotherapy: Secondary | ICD-10-CM | POA: Diagnosis not present

## 2019-01-04 DIAGNOSIS — I2510 Atherosclerotic heart disease of native coronary artery without angina pectoris: Secondary | ICD-10-CM | POA: Diagnosis not present

## 2019-01-04 DIAGNOSIS — I129 Hypertensive chronic kidney disease with stage 1 through stage 4 chronic kidney disease, or unspecified chronic kidney disease: Secondary | ICD-10-CM | POA: Diagnosis not present

## 2019-01-04 DIAGNOSIS — R531 Weakness: Secondary | ICD-10-CM | POA: Insufficient documentation

## 2019-01-04 DIAGNOSIS — R5383 Other fatigue: Secondary | ICD-10-CM | POA: Insufficient documentation

## 2019-01-04 DIAGNOSIS — N189 Chronic kidney disease, unspecified: Secondary | ICD-10-CM | POA: Insufficient documentation

## 2019-01-04 DIAGNOSIS — C482 Malignant neoplasm of peritoneum, unspecified: Secondary | ICD-10-CM | POA: Diagnosis not present

## 2019-01-04 DIAGNOSIS — R180 Malignant ascites: Secondary | ICD-10-CM | POA: Diagnosis not present

## 2019-01-04 DIAGNOSIS — Z9641 Presence of insulin pump (external) (internal): Secondary | ICD-10-CM | POA: Diagnosis not present

## 2019-01-04 LAB — COMPREHENSIVE METABOLIC PANEL
ALT: 15 U/L (ref 0–44)
AST: 27 U/L (ref 15–41)
Albumin: 2.3 g/dL — ABNORMAL LOW (ref 3.5–5.0)
Alkaline Phosphatase: 75 U/L (ref 38–126)
Anion gap: 13 (ref 5–15)
BUN: 45 mg/dL — AB (ref 8–23)
CO2: 21 mmol/L — ABNORMAL LOW (ref 22–32)
Calcium: 8.2 mg/dL — ABNORMAL LOW (ref 8.9–10.3)
Chloride: 99 mmol/L (ref 98–111)
Creatinine, Ser: 2 mg/dL — ABNORMAL HIGH (ref 0.44–1.00)
GFR calc Af Amer: 30 mL/min — ABNORMAL LOW (ref >60)
GFR calc non Af Amer: 26 mL/min — ABNORMAL LOW (ref >60)
GLUCOSE: 167 mg/dL — AB (ref 70–99)
Potassium: 5 mmol/L (ref 3.5–5.1)
Sodium: 133 mmol/L — ABNORMAL LOW (ref 135–145)
Total Bilirubin: 0.8 mg/dL (ref 0.3–1.2)
Total Protein: 5.2 g/dL — ABNORMAL LOW (ref 6.5–8.1)

## 2019-01-04 LAB — CBC WITH DIFFERENTIAL/PLATELET
Abs Immature Granulocytes: 0.03 10*3/uL (ref 0.00–0.07)
Basophils Absolute: 0 10*3/uL (ref 0.0–0.1)
Basophils Relative: 0 %
Eosinophils Absolute: 0.1 10*3/uL (ref 0.0–0.5)
Eosinophils Relative: 1 %
HEMATOCRIT: 31.7 % — AB (ref 36.0–46.0)
Hemoglobin: 9.6 g/dL — ABNORMAL LOW (ref 12.0–15.0)
Immature Granulocytes: 0 %
Lymphocytes Relative: 4 %
Lymphs Abs: 0.3 10*3/uL — ABNORMAL LOW (ref 0.7–4.0)
MCH: 31.4 pg (ref 26.0–34.0)
MCHC: 30.3 g/dL (ref 30.0–36.0)
MCV: 103.6 fL — ABNORMAL HIGH (ref 80.0–100.0)
Monocytes Absolute: 0.6 10*3/uL (ref 0.1–1.0)
Monocytes Relative: 8 %
Neutro Abs: 6.5 10*3/uL (ref 1.7–7.7)
Neutrophils Relative %: 87 %
Platelets: 81 10*3/uL — ABNORMAL LOW (ref 150–400)
RBC: 3.06 MIL/uL — ABNORMAL LOW (ref 3.87–5.11)
RDW: 17.9 % — ABNORMAL HIGH (ref 11.5–15.5)
WBC: 7.5 10*3/uL (ref 4.0–10.5)
nRBC: 0 % (ref 0.0–0.2)

## 2019-01-04 LAB — MAGNESIUM: Magnesium: 2.4 mg/dL (ref 1.7–2.4)

## 2019-01-04 MED ORDER — HEPARIN SOD (PORK) LOCK FLUSH 100 UNIT/ML IV SOLN
500.0000 [IU] | Freq: Once | INTRAVENOUS | Status: AC
  Administered 2019-01-04: 500 [IU] via INTRAVENOUS

## 2019-01-04 MED ORDER — SODIUM CHLORIDE 0.9% FLUSH
10.0000 mL | INTRAVENOUS | Status: DC | PRN
  Administered 2019-01-04: 10 mL via INTRAVENOUS
  Filled 2019-01-04: qty 10

## 2019-01-04 NOTE — Assessment & Plan Note (Signed)
1.  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 -Underwent 7 cycles of chemotherapy with carboplatin and paclitaxel from 06/01/2016 through 09/18/2016. -Germline mutation testing on 06/25/2016 was negative. - Underwent robotic assisted tumor debulking surgery on 10/14/2016 by Dr. Denman George. -3 cycles of chemotherapy with carboplatin and paclitaxel from 11/06/2016 through 12/25/2016. - Second line therapy with Doxil and bevacizumab from 05/25/2017 through 07/07/2017. - Third line therapy with gemcitabine from 07/30/2017 through December 2018, gemcitabine and cisplatin day 1 and day 8 q. 21 days from 11/02/2017, 7 cycles completed on 06/14/2018, with CT scan on 07/01/2018 showing progression. - 3 cycles of docetaxel 75 mg/m from 07/16/2018 through 08/26/2018. -CT CAP on 09/13/2018 showed progression of disease.  Malignant ascites present. 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 - Rucaparib 600 mg twice daily started on 09/25/2018. - 7 days after starting rucaparib, she started feeling very weak and nauseous.  Rucaparib was held on 10/05/2018 due to severe weakness.  -She was admitted to the hospital on 10/08/2018 through 10/11/2018 with weakness, and renal failure. -CT abdomen on 10/09/2018 showed stable omental and peritoneal disease. - I have decreased rucaparib to 300 mg twice a day on 10/15/2018. -Rucaparib was further dose reduced to 300 mg once daily on 11/08/2018 secondary to elevated creatinine of 2.0. -She is tolerating current dose of rucaparib 300 mg daily very well. -Ca1 25 on 09/06/2018 was 2798.  This is down to 1571 on 11/25/2018. -She was hospitalized on 11/28/2018 through 11/29/2018 with some shortness of breath and had paracentesis done. - CT abdomen and pelvis on 12/10/2018 shows extensive caking of the tumor along the omentum.  Peritoneal implant along the transverse  colon measures 5.3 x 3.6 cm, formerly 4.2 x 3.2 cm.  Caking of the tumor along the gastrohepatic ligament and below the stomach is mildly more prominent, with with a band of tumor below the stomach measuring 2 cm in thickness, formerly 1.7 cm.  No new areas were seen. -Because of her poor performance status, we have decided to continue with the same treatment for couple of more months and repeating scans.  We also discussed alternative chemo regimens. - She is continuing to feel weak.  She had couple of paracentesis done in the interim. -Her last Ca1 25 was 1613 on 12/31/2018. - We have repeated her blood work.  Potassium is 5.  Creatinine is elevated at 2.0.  We will repeat her labs in 2 weeks. - I have told her to cut back on Lasix to 20 mg every other day.  2.  Malignant ascites: -Paracentesis on 07/08/2018, 4 L removed. -Paracentesis on 09/21/2018, 7 L removed. -Paracentesis on 10/11/2018 with 4 L removed. -Paracentesis on 11/11/2018 with 5 L of yellow fluid removed. -Paracentesis on 11/29/2018, with 9 L fluid removed. -Paracentesis on 12/08/2018 with 5 L removed. -Paracentesis on 12/17/2018 with 6 L fluid removed. -Paracentesis on 12/31/2018 with 8.3 L removed. -She will continue Lasix 20 mg daily.   3.  CKD: - Creatinine has increased to 2.0.  Benzapril and metformin on hold. - I have told her to cut back on Lasix to 20 mg every other day. -We will cut back magnesium to once daily.  4.  Diabetes type 2: -She is on insulin pump and her sugars are fairly well controlled.  5.  Right posterior thigh pain: -She has right lower back pain and posterior thigh pain since October 2016 from  her back problems. -She takes hydrocodone 10 mg every 4-5 hours.  6.  Peripheral neuropathy: -She has on and off numbness in the toes.  No numbness in the hands reported.  She was prescribed gabapentin few months ago but she is not taking it.

## 2019-01-04 NOTE — Progress Notes (Signed)
Labs reviewed by MD. May discharge home at this time per MD.    Vitals stable and discharged home from clinic via wheelchair. Follow up as scheduled.

## 2019-01-04 NOTE — Progress Notes (Signed)
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 This may potentially be ovarian or a GI in etiology.    04/30/2016 Tumor Marker    CA 125- 7149.0 (H)    05/01/2016 Procedure    US paracentesis- Successful ultrasound-guided paracentesis yielding 1.8 liters of peritoneal fluid.    05/01/2016 Imaging    US pelvis- Both transabdominal and transvaginal sonography are significantly limited by large patient habitus and ascites. Neither uterus or ovaries were visualized on this exam.    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    05/08/2016 Imaging    CT chest- No evidence of metastatic disease in the chest. Peritoneal/omental disease with abdominal ascites in the upper abdomen, incompletely visualized.     05/13/2016 Procedure    Placement of single lumen port a cath via right internal jugular vein. The catheter tip lies at the cavoatrial junction. A power injectable port a cath was placed and is ready for immediate use.    05/15/2016 Procedure    US Paracentesis- 3400 ml yellow colored ascites removed    05/15/2016 - 09/18/2016 Chemotherapy    Carboplatin/Paclitaxel every 21 days x 7 cycles    07/01/2016 Miscellaneous    Genetic Counseling by [name]-  Genetic testing was normal, and did not reveal a deleterious mutation in these genes.     07/08/2016 Imaging    CT CAP- 1. Small volume ascites, significantly decreased. 2. Stable diffuse omental soft tissue caking and diffuse peritoneal thickening along the bilateral paracolic gutters and bilateral pelvic peritoneal reflections, consistent with peritoneal carcinomatosis. 3. Stable asymmetrically enlarged right ovary, which may represent the primary site of ovarian malignancy. 4. No evidence of metastatic disease in the chest. No new sites of metastatic disease in the abdomen or pelvis.    07/09/2016 Miscellaneous    Gyn Onc re-evaluation- modest response to therapy, 3 more cycles of chemotherapy recommended.      09/11/2016 Imaging    CT C/A/P No significant change omental soft tissue caking, consistent with metastatic disease. Mild ascites is decreased since previous study.  Increased calcification along peritoneal surface in pelvic cul-de-sac, consistent with treated peritoneal metastatic disease.  Stable 4.5cm homogeneous right pelvic mass, which favors a uterine fibroid although right ovarian neoplasm cannot definitely be excluded.  No new or progressive metastatic disease identified. No evidence of metastatic disease within the thorax.     10/14/2016 Procedure    Robotic-assisted laparoscopic total hysterectomy with bilateral salpingoophorectomy, ex lap omentectomy, radical tumor debulking by Dr. Denman George    10/17/2016 Pathology Results    Diagnosis 1. 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 - CERVIX AND ENDOMETRIUM FREE OF TUMOR. - SEE ONCOLOGY TABLE AND COMMENT. 2. Soft  tissue, biopsy, umbilical nodule - HIGH GRADE SEROUS CARCINOMA. 3. Omentum, resection for tumor - HIGH GRADE SEROUS CARCINOMA, 33 CM.    11/06/2016 - 12/25/2016  Chemotherapy    Carboplatin/Paclitaxel x 3 cycles     01/12/2017 Imaging    CT CAP- 1. Interval hysterectomy, bilateral salpingo-oophorectomy and omentectomy without evidence of tumor recurrence. 2. No evidence of metastatic disease. 3. 5 mm nonobstructing lower pole left renal calculus.    01/13/2017 Remission    No evidence of residual disease on CT imaging.    05/04/2017 Imaging    CT CAP- New small amount of ascites within the abdomen and pelvis since 01/12/2017 which could indicate disease progression but no new identifiable tumor and no significant change in omental and mild peritoneal thickening.  No evidence of metastatic disease within the chest.  Coronary artery disease.  Aortic Atherosclerosis (ICD10-I70.0).    05/04/2017 Tumor Marker    Patient's tumor was tested for the following markers: CA 125 Results of the tumor marker test revealed 7654    05/18/2017 Imaging    ECHO; EF 60% -  65    05/25/2017 - 07/07/2017 Chemotherapy    She received Doxil and Avastin. Treatment is stopped due to disease progression    06/01/2017 Procedure    Successful ultrasound-guided therapeutic paracentesis yielding 2.8 liters of peritoneal fluid    06/22/2017 Tumor Marker    Patient's tumor was tested for the following markers: CA 125 Results of the tumor marker test revealed 13440    07/20/2017 Imaging    Mild increase in peritoneal carcinoma within abdomen pelvis since previous study. No significant change and minimal ascites.  No evidence of metastatic disease within the thorax. New mild airspace opacity in left lower lobe, consistent with inflammatory or infectious etiology.    07/30/2017 Tumor Marker    Patient's tumor was tested for the following markers: CA 125 Results of the tumor marker test revealed 12099    07/30/2017 - 06/14/2018 Chemotherapy    The patient had gemzar. Cisplatin is added on 11/02/18    08/03/2017 - 08/05/2017 Hospital Admission    She was admitted to the  hospital for management of UTI and neutropenic fever    08/13/2017 Adverse Reaction    Dose of chemotherapy is reduced due to neutropenic sepsis    08/24/2017 Tumor Marker    Patient's tumor was tested for the following markers: CA 125 Results of the tumor marker test revealed 7431    09/21/2017 Tumor Marker    Patient's tumor was tested for the following markers: CA 125 Results of the tumor marker test revealed 4176    10/09/2017 Imaging    No significant change in peritoneal carcinomatosis since previous study.  No new or progressive metastatic disease within the abdomen or pelvis.  Stable tiny nonobstructive left renal calculus. No evidence of ureteral calculi or hydronephrosis.    11/10/2017 Tumor Marker    Patient's tumor was tested for the following markers: CA 125 Results of the tumor marker test revealed 4257    11/24/2017 Tumor Marker    Patient's tumor was tested for the following markers: CA 125 Results of the tumor marker test revealed 3377    01/12/2018 Tumor Marker    Patient's tumor was tested for the following markers: CA 125 Results of the tumor marker test revealed 1595    01/21/2018 Imaging    1. Stable to slightly improved omental, gastrohepatic ligament and pelvic disease as detailed above. No new/progressive findings. 2. No acute  abdominal or pelvic findings. 3. Stable lower pole left renal calculus.    02/08/2018 Tumor Marker    Patient's tumor was tested for the following markers: CA 125 Results of the tumor marker test revealed 1058    03/08/2018 Tumor Marker    Patient's tumor was tested for the following markers: CA-125 Results of the tumor marker test revealed 737.1    04/02/2018 Tumor Marker    Patient's tumor was tested for the following markers: CA-125 Results of the tumor marker test revealed 542.8    06/14/2018 Tumor Marker    CA 125- 1010    06/30/2018 Imaging    1. Mixed appearance but overall progression, with the left upper quadrant omental  tumor slightly improved but with some potential new deposition of tumor along the hepatic flexure margin. There is also new moderate ascites which must be considered suspicious for malignant ascites/peritoneal spread. 2. Other imaging findings of potential clinical significance: Nonobstructive left nephrolithiasis. Aortic Atherosclerosis (ICD10-I70.0). Mild subcutaneous edema along the sixth anterior abdominal wall potentially from mild panniculitis.    07/08/2018 Procedure    Successful ultrasound-guided paracentesis yielding 4 liters of peritoneal fluid.    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 female returns for routine follow-up High-grade serous carcinoma of the peritoneum. She is here today with her husband. She has recently been in the hospital. She stopped urinating and had increased abdominal pressure. She had a paracentesis and she was discharged the next day.  Denies any nausea, vomiting or diarrhea.  She reports shortness of breath on exertion.  Denies any numbness or tingling in hands or feet. Denies any recent fevers, infections, or recent hospitalizations.  Energy and appetite are at 25%.  REVIEW OF SYSTEMS:  Review of Systems  Constitutional: Positive for fatigue.  Respiratory: Positive for shortness of breath.   Gastrointestinal: Negative for abdominal distention.  All other systems reviewed and are negative.    PAST MEDICAL/SURGICAL HISTORY:  Past Medical History:  Diagnosis Date  . Diabetes mellitus without complication (HCC)    on insulin pump  .  Dysrhythmia   . 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 Family history of breast cancer   . GERD (gastroesophageal reflux disease)   . History of blood transfusion   . History of bronchitis   . History of chemotherapy   . History of urinary tract infection   . Hyperlipidemia   . Hypertension   . Low serum vitamin D   . Ovarian cancer ([facility]) 05/09/2016  . Shingles    Past Surgical History:  Procedure Laterality Date  . ABDOMINAL HYSTERECTOMY  10/14/2016  . CESAREAN SECTION    . DEBULKING N/A 10/14/2016   Procedure: DEBULKING;  Surgeon: Everitt Amber, MD;  Location: [facility];  Service: Gynecology;  Laterality: N/A;  . IR PARACENTESIS  06/01/2017  . LAPAROTOMY WITH STAGING N/A 10/14/2016   Procedure: LAPAROTOMY WITH OMENTECTOMY AND TUMOR DEBULGING;  Surgeon: Everitt Amber, MD;  Location: [facility];  Service: Gynecology;  Laterality: N/A;  . LUMBAR FUSION  08/21/15   L3-L4  Dr. Timmothy Euler  . OMENTECTOMY N/A 10/14/2016   Procedure: OMENTECTOMY;  Surgeon: Everitt Amber, MD;  Location: [facility];  Service: Gynecology;  Laterality: N/A;  . ROBOTIC ASSISTED TOTAL HYSTERECTOMY WITH BILATERAL SALPINGO OOPHERECTOMY Bilateral 10/14/2016   Procedure: XI ROBOTIC ASSISTED TOTAL LAPARSCOPIC  HYSTERECTOMY WITH BILATERAL SALPINGO OOPHORECTOMY;  Surgeon: Everitt Amber, MD;  Location: [facility];  Service: Gynecology;  Laterality: Bilateral;     SOCIAL HISTORY:  Social History   Socioeconomic History  . Marital status: Married    Spouse name: Gershon Mussel  . Number of children: 2  . Years of education: Not on file  . Highest education level: Not on file  Occupational History  . Occupation: retired  Scientific laboratory technician  . Financial resource strain: Not on file  . Food insecurity:    Worry: Not on file    Inability: Not on file  . Transportation needs:    Medical: Not on file    Non-medical: Not on file  Tobacco Use  . Smoking status: Never Smoker  . Smokeless tobacco: Never Used  Substance and Sexual  Activity  . Alcohol use: No  . Drug use: No  . Sexual activity: Not on file    Comment: married  Lifestyle  . Physical activity:    Days per week: Not on file    Minutes per session: Not on file  . Stress: Not on file  Relationships  . Social connections:    Talks on phone: Not on file    Gets together: Not on file    Attends religious service: Not on file    Active member of club or organization: Not on file    Attends meetings of clubs or organizations: Not on file    Relationship status: Not on file  . Intimate partner violence:    Fear of current or ex partner: Not on file    Emotionally abused: Not on file    Physically abused: Not on file    Forced sexual activity: Not on file  Other Topics Concern  . Not on file  Social History Narrative  . Not on file    FAMILY HISTORY:  Family History  Problem Relation Age of Onset  . Lung cancer Mother        smoker; dx in her 80s  . Leukemia Father   . Diabetes Paternal Grandmother   . Heart attack Paternal Grandmother   . Diabetes Paternal Grandfather   . Breast cancer Paternal Aunt        dx in her 61s-30s  . Leukemia Paternal Uncle   . Heart attack Maternal Grandfather   . Breast cancer Cousin        maternal first cousin    CURRENT MEDICATIONS:  Outpatient Encounter Medications as of 01/04/2019  Medication Sig  . atorvastatin (LIPITOR) 40 MG tablet Take 1 tablet (40 mg total) by mouth daily.  . Biotin 1000 MCG tablet Take 1,000 mcg by mouth daily.   . furosemide (LASIX) 20 MG tablet TAKE 1 TABLET(20 MG) BY MOUTH TWICE DAILY  . glucose blood (ONE TOUCH ULTRA TEST) test strip Use to test blood sugar 6-8 times daily  . HYDROcodone-acetaminophen (NORCO) 10-325 MG tablet Take 1 tablet by mouth every 4 (four) hours as needed.  . insulin lispro (HUMALOG) 100 UNIT/ML injection USE IN INSULIN PUMP AS DIRECTED. MAX DAILY DOSE OF 110 UNITS PER DAY. (Patient taking differently: Inject 110 Units into the skin See admin  instructions. USE IN INSULIN PUMP AS  DIRECTED. MAX DAILY DOSE OF 110 UNITS PER DAY.)  . lidocaine-prilocaine (EMLA) cream Apply a quarter size amount to port site 1 hour prior to chemo. Do not rub in. Cover with plastic wrap.  . magnesium oxide (MAG-OX) 400 MG tablet Take 400 mg by mouth 2 (two) times daily.  . Multiple Vitamin (MULTIVITAMIN WITH MINERALS) TABS Take 1 tablet by mouth daily.  Marland Kitchen omeprazole (PRILOSEC) 40 MG capsule Take 1 capsule (40 mg total) by mouth daily.  . polyethylene glycol (MIRALAX / GLYCOLAX) packet Take 17 g by mouth daily as needed for mild constipation.   . prochlorperazine (COMPAZINE) 10 MG tablet Take 1 tablet (10 mg total) by mouth every 6 (six) hours as needed for nausea or vomiting.  . RUBRACA 300 MG tablet Take 1 tablet (300 mg total) by mouth daily.  . vitamin B-12 (CYANOCOBALAMIN) 1000 MCG tablet Take 1,000 mcg by mouth daily.  . vitamin E (VITAMIN E) 400 UNIT capsule Take 400 Units by mouth daily.   No facility-administered encounter medications on file as of 01/04/2019.     ALLERGIES:  Allergies  Allergen Reactions  . Avandia [Rosiglitazone] Other (See Comments)    Legs swelled  . Micronase [Glyburide] Swelling  . Actos [Pioglitazone] Other (See Comments)    Edema / leg swelling     PHYSICAL EXAM:  ECOG Performance status: 1  Vitals:   01/04/19 0844  BP: (!) 82/54  Pulse: (!) 126  Resp: 20  SpO2: 98%   Filed Weights   01/04/19 0844  Weight: 179 lb 6.4 oz (81.4 kg)    Physical Exam Constitutional:      Appearance: Normal appearance. She is normal weight.  Abdominal:     General: There is distension.  Musculoskeletal: Normal range of motion.  Skin:    General: Skin is warm and dry.  Neurological:     Mental Status: She is alert and oriented to person, place, and time. Mental status is at baseline.  Psychiatric:        Mood and Affect: Mood normal.        Behavior: Behavior normal.        Thought Content: Thought content normal.          Judgment: Judgment normal.   Chest: Bilateral clear to auscultation CVS: S1-S2 regular rate and rhythm Trace edema bilaterally.  LABORATORY DATA:  I have reviewed the labs as listed.  CBC    Component Value Date/Time   WBC 7.5 01/04/2019 0924   RBC 3.06 (L) 01/04/2019 0924   HGB 9.6 (L) 01/04/2019 0924   HGB 9.6 (L) 11/02/2017 0756   HCT 31.7 (L) 01/04/2019 0924   HCT 21.0 (L) 10/11/2018 0556   HCT 30.6 (L) 11/02/2017 0756   PLT 81 (L) 01/04/2019 0924   PLT 150 11/02/2017 0756   PLT 40 (LL) 11/18/2016 1642   MCV 103.6 (H) 01/04/2019 0924   MCV 108.5 (H) 11/02/2017 0756   MCH 31.4 01/04/2019 0924   MCHC 30.3 01/04/2019 0924   RDW 17.9 (H) 01/04/2019 0924   RDW 17.8 (H) 11/02/2017 0756   LYMPHSABS 0.3 (L) 01/04/2019 0924   LYMPHSABS 0.4 (L) 11/02/2017 0756   MONOABS 0.6 01/04/2019 0924   MONOABS 0.5 11/02/2017 0756   EOSABS 0.1 01/04/2019 0924   EOSABS 0.7 (H) 11/02/2017 0756   EOSABS 0.1 11/18/2016 1642   BASOSABS 0.0 01/04/2019 0924   BASOSABS 0.0 11/02/2017 0756   CMP Latest Ref Rng & Units 01/04/2019 12/31/2018 12/01/2018  Glucose 70 -  99 mg/dL 167(H) 159(H) 121(H)  BUN 8 - 23 mg/dL 45(H) 51(H) 32(H)  Creatinine 0.44 - 1.00 mg/dL 2.00(H) 2.26(H) 1.57(H)  Sodium 135 - 145 mmol/L 133(L) 129(L) 136  Potassium 3.5 - 5.1 mmol/L 5.0 5.9(H) 4.3  Chloride 98 - 111 mmol/L 99 94(L) 100  CO2 22 - 32 mmol/L 21(L) 20(L) 26  Calcium 8.9 - 10.3 mg/dL 8.2(L) 8.2(L) 8.5(L)  Total Protein 6.5 - 8.1 g/dL 5.2(L) 5.4(L) -  Total Bilirubin 0.3 - 1.2 mg/dL 0.8 0.8 -  Alkaline Phos 38 - 126 U/L 75 75 -  AST 15 - 41 U/L 27 29 -  ALT 0 - 44 U/L 15 12 -       DIAGNOSTIC IMAGING:  I have independently reviewed the scans and discussed with the patient.      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  High-grade serous carcinoma of the peritoneum: - Diagnosed in June 2017, presented with abdominal distention and ascites, needed paracentesis  x2. -Underwent 7 cycles of chemotherapy with carboplatin and paclitaxel from 06/01/2016 through 09/18/2016. -Germline mutation testing on 06/25/2016 was negative. - Underwent robotic assisted tumor debulking surgery on 10/14/2016 by Dr. Denman George. -3 cycles of chemotherapy with carboplatin and paclitaxel from 11/06/2016 through 12/25/2016. - Second line therapy with Doxil and bevacizumab from 05/25/2017 through 07/07/2017. - Third line therapy with gemcitabine from 07/30/2017 through December 2018, gemcitabine and cisplatin day 1 and day 8 q. 21 days from 11/02/2017, 7 cycles completed on 06/14/2018, with CT scan on 07/01/2018 showing progression. - 3 cycles of docetaxel 75 mg/m from 07/16/2018 through 08/26/2018. -CT CAP on 09/13/2018 showed progression of disease.  Malignant ascites present. 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 - Rucaparib 600 mg twice daily started on 09/25/2018. - 7 days after starting rucaparib, she started feeling very weak and nauseous.  Rucaparib was held on 10/05/2018 due to severe weakness.  -She was admitted to the hospital on 10/08/2018 through 10/11/2018 with weakness, and renal failure. -CT abdomen on 10/09/2018 showed stable omental and peritoneal disease. - I have decreased rucaparib to 300 mg twice a day on 10/15/2018. -Rucaparib was further dose reduced to 300 mg once daily on 11/08/2018 secondary to elevated creatinine of 2.0. -She is tolerating current dose of rucaparib 300 mg daily very well. -Ca1 25 on 09/06/2018 was 2798.  This is down to 1571 on 11/25/2018. -She was hospitalized on 11/28/2018 through 11/29/2018 with some shortness of breath and had paracentesis done. - CT abdomen and pelvis on 12/10/2018 shows extensive caking of the tumor along the omentum.  Peritoneal implant along the transverse colon measures 5.3 x 3.6 cm, formerly 4.2 x 3.2 cm.  Caking of the tumor along the gastrohepatic ligament and below the stomach is mildly more  prominent, with with a band of tumor below the stomach measuring 2 cm in thickness, formerly 1.7 cm.  No new areas were seen. -Because of her poor performance status, we have decided to continue with the same treatment for couple of more months and repeating scans.  We also discussed alternative chemo regimens. - She is continuing to feel weak.  She had couple of paracentesis done in the interim. -Her last Ca1 25 was 1613 on 12/31/2018. - We have repeated her blood work.  Potassium is 5.  Creatinine is elevated at 2.0.  We will repeat her labs in 2 weeks. - I have told her to cut back on Lasix to 20 mg every other  day.  2.  Malignant ascites: -Paracentesis on 07/08/2018, 4 L removed. -Paracentesis on 09/21/2018, 7 L removed. -Paracentesis on 10/11/2018 with 4 L removed. -Paracentesis on 11/11/2018 with 5 L of yellow fluid removed. -Paracentesis on 11/29/2018, with 9 L fluid removed. -Paracentesis on 12/08/2018 with 5 L removed. -Paracentesis on 12/17/2018 with 6 L fluid removed. -Paracentesis on 12/31/2018 with 8.3 L removed. -She will continue Lasix 20 mg daily.   3.  CKD: - Creatinine has increased to 2.0.  Benzapril and metformin on hold. - I have told her to cut back on Lasix to 20 mg every other day. -We will cut back magnesium to once daily.  4.  Diabetes type 2: -She is on insulin pump and her sugars are fairly well controlled.  5.  Right posterior thigh pain: -She has right lower back pain and posterior thigh pain since October 2016 from her back problems. -She takes hydrocodone 10 mg every 4-5 hours.  6.  Peripheral neuropathy: -She has on and off numbness in the toes.  No numbness in the hands reported.  She was prescribed gabapentin few months ago but she is not taking it.   Total time spent is 25 minutes with more than 50% of the time spent face-to-face discussing lab results, options and coordination of care.  Orders placed this encounter:  Orders Placed This Encounter    Procedures  . Magnesium  . CBC with Differential/Platelet  . Comprehensive metabolic panel  . Comprehensive metabolic panel  . CBC with Differential  . Magnesium      Derek Jack, MD [place] [phone]

## 2019-01-05 ENCOUNTER — Other Ambulatory Visit (HOSPITAL_COMMUNITY): Payer: Self-pay | Admitting: *Deleted

## 2019-01-05 ENCOUNTER — Other Ambulatory Visit (HOSPITAL_COMMUNITY): Payer: Self-pay | Admitting: Nurse Practitioner

## 2019-01-05 DIAGNOSIS — C569 Malignant neoplasm of unspecified ovary: Secondary | ICD-10-CM

## 2019-01-05 MED ORDER — RUBRACA 300 MG PO TABS: 300.0000 mg | ORAL_TABLET | Freq: Every day | ORAL | 0 refills | Status: AC

## 2019-01-05 MED ORDER — HYDROCODONE-ACETAMINOPHEN 10-325 MG PO TABS: 1.0000 | ORAL_TABLET | ORAL | 0 refills | Status: AC | PRN

## 2019-01-05 MED FILL — RUBRACA 300 MG TAB: 300 | 30 days supply | Qty: 30 | Fill #0

## 2019-01-05 NOTE — Telephone Encounter (Signed)
Chart reviewed, rubraca refilled.

## 2019-01-10 ENCOUNTER — Other Ambulatory Visit (HOSPITAL_COMMUNITY): Payer: Self-pay | Admitting: Nurse Practitioner

## 2019-01-10 ENCOUNTER — Other Ambulatory Visit: Payer: Self-pay | Admitting: Nurse Practitioner

## 2019-01-10 DIAGNOSIS — C569 Malignant neoplasm of unspecified ovary: Secondary | ICD-10-CM

## 2019-01-13 ENCOUNTER — Encounter (HOSPITAL_COMMUNITY): Payer: Self-pay

## 2019-01-13 ENCOUNTER — Other Ambulatory Visit: Payer: Self-pay

## 2019-01-13 ENCOUNTER — Ambulatory Visit (HOSPITAL_COMMUNITY)
Admission: RE | Admit: 2019-01-13 | Discharge: 2019-01-13 | Disposition: A | Discharge status: Home / Self Care | Payer: Medicare Other | Source: Ambulatory Visit | Attending: Nurse Practitioner | Admitting: Nurse Practitioner

## 2019-01-13 DIAGNOSIS — C569 Malignant neoplasm of unspecified ovary: Secondary | ICD-10-CM | POA: Diagnosis not present

## 2019-01-13 NOTE — Progress Notes (Signed)
Paracentesis complete no signs of distress.  

## 2019-01-19 ENCOUNTER — Other Ambulatory Visit: Payer: Self-pay

## 2019-01-20 ENCOUNTER — Inpatient Hospital Stay (HOSPITAL_COMMUNITY): Payer: Medicare Other

## 2019-01-20 ENCOUNTER — Emergency Department (HOSPITAL_COMMUNITY): Payer: Medicare Other

## 2019-01-20 ENCOUNTER — Encounter (HOSPITAL_COMMUNITY): Payer: Self-pay

## 2019-01-20 ENCOUNTER — Other Ambulatory Visit: Payer: Self-pay

## 2019-01-20 ENCOUNTER — Encounter (HOSPITAL_COMMUNITY): Payer: Self-pay | Admitting: Hematology

## 2019-01-20 ENCOUNTER — Inpatient Hospital Stay (HOSPITAL_COMMUNITY): Payer: Medicare Other | Admitting: Hematology

## 2019-01-20 ENCOUNTER — Inpatient Hospital Stay (HOSPITAL_COMMUNITY)
Admission: EM | Admit: 2019-01-20 | Discharge: 2019-01-26 | DRG: 641 | Disposition: A | Discharge status: Hospice | Payer: Medicare Other | Source: Ambulatory Visit | Attending: Internal Medicine | Admitting: Internal Medicine

## 2019-01-20 DIAGNOSIS — I2510 Atherosclerotic heart disease of native coronary artery without angina pectoris: Secondary | ICD-10-CM | POA: Diagnosis not present

## 2019-01-20 DIAGNOSIS — Z8249 Family history of ischemic heart disease and other diseases of the circulatory system: Secondary | ICD-10-CM

## 2019-01-20 DIAGNOSIS — Z79899 Other long term (current) drug therapy: Secondary | ICD-10-CM

## 2019-01-20 DIAGNOSIS — Z888 Allergy status to other drugs, medicaments and biological substances status: Secondary | ICD-10-CM

## 2019-01-20 DIAGNOSIS — R197 Diarrhea, unspecified: Secondary | ICD-10-CM | POA: Diagnosis not present

## 2019-01-20 DIAGNOSIS — E875 Hyperkalemia: Secondary | ICD-10-CM | POA: Diagnosis not present

## 2019-01-20 DIAGNOSIS — Z806 Family history of leukemia: Secondary | ICD-10-CM

## 2019-01-20 DIAGNOSIS — Z515 Encounter for palliative care: Secondary | ICD-10-CM

## 2019-01-20 DIAGNOSIS — R600 Localized edema: Secondary | ICD-10-CM

## 2019-01-20 DIAGNOSIS — E871 Hypo-osmolality and hyponatremia: Secondary | ICD-10-CM

## 2019-01-20 DIAGNOSIS — R531 Weakness: Secondary | ICD-10-CM | POA: Diagnosis not present

## 2019-01-20 DIAGNOSIS — G629 Polyneuropathy, unspecified: Secondary | ICD-10-CM

## 2019-01-20 DIAGNOSIS — R0602 Shortness of breath: Secondary | ICD-10-CM | POA: Diagnosis not present

## 2019-01-20 DIAGNOSIS — N183 Chronic kidney disease, stage 3 (moderate): Secondary | ICD-10-CM | POA: Diagnosis present

## 2019-01-20 DIAGNOSIS — C482 Malignant neoplasm of peritoneum, unspecified: Secondary | ICD-10-CM | POA: Diagnosis not present

## 2019-01-20 DIAGNOSIS — K219 Gastro-esophageal reflux disease without esophagitis: Secondary | ICD-10-CM | POA: Diagnosis present

## 2019-01-20 DIAGNOSIS — R5383 Other fatigue: Secondary | ICD-10-CM

## 2019-01-20 DIAGNOSIS — R0600 Dyspnea, unspecified: Secondary | ICD-10-CM

## 2019-01-20 DIAGNOSIS — Z803 Family history of malignant neoplasm of breast: Secondary | ICD-10-CM

## 2019-01-20 DIAGNOSIS — I129 Hypertensive chronic kidney disease with stage 1 through stage 4 chronic kidney disease, or unspecified chronic kidney disease: Secondary | ICD-10-CM | POA: Diagnosis present

## 2019-01-20 DIAGNOSIS — Z9221 Personal history of antineoplastic chemotherapy: Secondary | ICD-10-CM

## 2019-01-20 DIAGNOSIS — I959 Hypotension, unspecified: Secondary | ICD-10-CM | POA: Diagnosis present

## 2019-01-20 DIAGNOSIS — E785 Hyperlipidemia, unspecified: Secondary | ICD-10-CM | POA: Diagnosis present

## 2019-01-20 DIAGNOSIS — Z7401 Bed confinement status: Secondary | ICD-10-CM

## 2019-01-20 DIAGNOSIS — E1122 Type 2 diabetes mellitus with diabetic chronic kidney disease: Secondary | ICD-10-CM

## 2019-01-20 DIAGNOSIS — N179 Acute kidney failure, unspecified: Secondary | ICD-10-CM | POA: Diagnosis present

## 2019-01-20 DIAGNOSIS — Z794 Long term (current) use of insulin: Secondary | ICD-10-CM

## 2019-01-20 DIAGNOSIS — N189 Chronic kidney disease, unspecified: Secondary | ICD-10-CM

## 2019-01-20 DIAGNOSIS — Z833 Family history of diabetes mellitus: Secondary | ICD-10-CM

## 2019-01-20 DIAGNOSIS — Z90710 Acquired absence of both cervix and uterus: Secondary | ICD-10-CM

## 2019-01-20 DIAGNOSIS — F411 Generalized anxiety disorder: Secondary | ICD-10-CM

## 2019-01-20 DIAGNOSIS — G620 Drug-induced polyneuropathy: Secondary | ICD-10-CM | POA: Diagnosis present

## 2019-01-20 DIAGNOSIS — D631 Anemia in chronic kidney disease: Secondary | ICD-10-CM | POA: Diagnosis present

## 2019-01-20 DIAGNOSIS — Z801 Family history of malignant neoplasm of trachea, bronchus and lung: Secondary | ICD-10-CM

## 2019-01-20 DIAGNOSIS — C569 Malignant neoplasm of unspecified ovary: Secondary | ICD-10-CM

## 2019-01-20 DIAGNOSIS — D6481 Anemia due to antineoplastic chemotherapy: Secondary | ICD-10-CM | POA: Diagnosis present

## 2019-01-20 DIAGNOSIS — Z90722 Acquired absence of ovaries, bilateral: Secondary | ICD-10-CM | POA: Diagnosis not present

## 2019-01-20 DIAGNOSIS — Z66 Do not resuscitate: Secondary | ICD-10-CM | POA: Diagnosis not present

## 2019-01-20 DIAGNOSIS — E538 Deficiency of other specified B group vitamins: Secondary | ICD-10-CM | POA: Diagnosis present

## 2019-01-20 DIAGNOSIS — R180 Malignant ascites: Secondary | ICD-10-CM

## 2019-01-20 DIAGNOSIS — M8580 Other specified disorders of bone density and structure, unspecified site: Secondary | ICD-10-CM | POA: Diagnosis present

## 2019-01-20 DIAGNOSIS — R6889 Other general symptoms and signs: Secondary | ICD-10-CM

## 2019-01-20 DIAGNOSIS — D6959 Other secondary thrombocytopenia: Secondary | ICD-10-CM | POA: Diagnosis present

## 2019-01-20 DIAGNOSIS — C481 Malignant neoplasm of specified parts of peritoneum: Secondary | ICD-10-CM

## 2019-01-20 DIAGNOSIS — I452 Bifascicular block: Secondary | ICD-10-CM | POA: Diagnosis present

## 2019-01-20 DIAGNOSIS — Z9641 Presence of insulin pump (external) (internal): Secondary | ICD-10-CM

## 2019-01-20 DIAGNOSIS — T451X5A Adverse effect of antineoplastic and immunosuppressive drugs, initial encounter: Secondary | ICD-10-CM | POA: Diagnosis present

## 2019-01-20 DIAGNOSIS — E860 Dehydration: Secondary | ICD-10-CM | POA: Diagnosis present

## 2019-01-20 DIAGNOSIS — H409 Unspecified glaucoma: Secondary | ICD-10-CM | POA: Diagnosis present

## 2019-01-20 DIAGNOSIS — C799 Secondary malignant neoplasm of unspecified site: Secondary | ICD-10-CM | POA: Diagnosis present

## 2019-01-20 DIAGNOSIS — R188 Other ascites: Secondary | ICD-10-CM

## 2019-01-20 LAB — CBC WITH DIFFERENTIAL/PLATELET
Abs Immature Granulocytes: 0.1 10*3/uL — ABNORMAL HIGH (ref 0.00–0.07)
Abs Immature Granulocytes: 0.1 10*3/uL — ABNORMAL HIGH (ref 0.00–0.07)
BASOS ABS: 0 10*3/uL (ref 0.0–0.1)
Basophils Absolute: 0 10*3/uL (ref 0.0–0.1)
Basophils Relative: 0 %
Basophils Relative: 0 %
Eosinophils Absolute: 0.1 10*3/uL (ref 0.0–0.5)
Eosinophils Absolute: 0 10*3/uL (ref 0.0–0.5)
Eosinophils Relative: 1 %
Eosinophils Relative: 0 %
HCT: 30.6 % — ABNORMAL LOW (ref 36.0–46.0)
HEMATOCRIT: 31.8 % — AB (ref 36.0–46.0)
Hemoglobin: 9.9 g/dL — ABNORMAL LOW (ref 12.0–15.0)
Hemoglobin: 9.6 g/dL — ABNORMAL LOW (ref 12.0–15.0)
Immature Granulocytes: 1 %
Immature Granulocytes: 1 %
LYMPHS ABS: 0.4 10*3/uL — AB (ref 0.7–4.0)
LYMPHS PCT: 3 %
Lymphocytes Relative: 3 %
Lymphs Abs: 0.3 10*3/uL — ABNORMAL LOW (ref 0.7–4.0)
MCH: 32.8 pg (ref 26.0–34.0)
MCH: 32.2 pg (ref 26.0–34.0)
MCHC: 31.1 g/dL (ref 30.0–36.0)
MCHC: 31.4 g/dL (ref 30.0–36.0)
MCV: 105.3 fL — ABNORMAL HIGH (ref 80.0–100.0)
MCV: 102.7 fL — ABNORMAL HIGH (ref 80.0–100.0)
Monocytes Absolute: 0.9 10*3/uL (ref 0.1–1.0)
Monocytes Absolute: 0.8 10*3/uL (ref 0.1–1.0)
Monocytes Relative: 8 %
Monocytes Relative: 7 %
NEUTROS ABS: 10.2 10*3/uL — AB (ref 1.7–7.7)
Neutro Abs: 9.9 10*3/uL — ABNORMAL HIGH (ref 1.7–7.7)
Neutrophils Relative %: 87 %
Neutrophils Relative %: 89 %
Platelets: 33 10*3/uL — ABNORMAL LOW (ref 150–400)
Platelets: 34 10*3/uL — ABNORMAL LOW (ref 150–400)
RBC: 3.02 MIL/uL — ABNORMAL LOW (ref 3.87–5.11)
RBC: 2.98 MIL/uL — ABNORMAL LOW (ref 3.87–5.11)
RDW: 18.6 % — ABNORMAL HIGH (ref 11.5–15.5)
RDW: 18.7 % — ABNORMAL HIGH (ref 11.5–15.5)
WBC: 11.4 10*3/uL — ABNORMAL HIGH (ref 4.0–10.5)
WBC: 11.5 10*3/uL — ABNORMAL HIGH (ref 4.0–10.5)
nRBC: 0 % (ref 0.0–0.2)
nRBC: 0 % (ref 0.0–0.2)

## 2019-01-20 LAB — PROTIME-INR
INR: 1.2 (ref 0.8–1.2)
Prothrombin Time: 14.8 seconds (ref 11.4–15.2)

## 2019-01-20 LAB — LACTIC ACID, PLASMA
Lactic Acid, Venous: 1.6 mmol/L (ref 0.5–1.9)
Lactic Acid, Venous: 1.9 mmol/L (ref 0.5–1.9)

## 2019-01-20 LAB — COMPREHENSIVE METABOLIC PANEL
ALT: 21 U/L (ref 0–44)
ALT: 21 U/L (ref 0–44)
AST: 34 U/L (ref 15–41)
AST: 28 U/L (ref 15–41)
Albumin: 2.2 g/dL — ABNORMAL LOW (ref 3.5–5.0)
Albumin: 2.2 g/dL — ABNORMAL LOW (ref 3.5–5.0)
Alkaline Phosphatase: 97 U/L (ref 38–126)
Alkaline Phosphatase: 94 U/L (ref 38–126)
Anion gap: 11 (ref 5–15)
Anion gap: 11 (ref 5–15)
BUN: 64 mg/dL — AB (ref 8–23)
BUN: 67 mg/dL — ABNORMAL HIGH (ref 8–23)
CO2: 19 mmol/L — AB (ref 22–32)
CO2: 19 mmol/L — AB (ref 22–32)
Calcium: 8.3 mg/dL — ABNORMAL LOW (ref 8.9–10.3)
Calcium: 8.1 mg/dL — ABNORMAL LOW (ref 8.9–10.3)
Chloride: 97 mmol/L — ABNORMAL LOW (ref 98–111)
Chloride: 96 mmol/L — ABNORMAL LOW (ref 98–111)
Creatinine, Ser: 2.17 mg/dL — ABNORMAL HIGH (ref 0.44–1.00)
Creatinine, Ser: 2.27 mg/dL — ABNORMAL HIGH (ref 0.44–1.00)
GFR calc Af Amer: 27 mL/min — ABNORMAL LOW (ref >60)
GFR calc Af Amer: 25 mL/min — ABNORMAL LOW (ref >60)
GFR calc non Af Amer: 23 mL/min — ABNORMAL LOW (ref >60)
GFR calc non Af Amer: 22 mL/min — ABNORMAL LOW (ref >60)
Glucose, Bld: 186 mg/dL — ABNORMAL HIGH (ref 70–99)
Glucose, Bld: 197 mg/dL — ABNORMAL HIGH (ref 70–99)
Potassium: 6.6 mmol/L (ref 3.5–5.1)
Potassium: 6.6 mmol/L (ref 3.5–5.1)
SODIUM: 127 mmol/L — AB (ref 135–145)
Sodium: 126 mmol/L — ABNORMAL LOW (ref 135–145)
Total Bilirubin: 0.7 mg/dL (ref 0.3–1.2)
Total Bilirubin: 0.8 mg/dL (ref 0.3–1.2)
Total Protein: 5.3 g/dL — ABNORMAL LOW (ref 6.5–8.1)
Total Protein: 5.3 g/dL — ABNORMAL LOW (ref 6.5–8.1)

## 2019-01-20 LAB — MAGNESIUM: Magnesium: 2.6 mg/dL — ABNORMAL HIGH (ref 1.7–2.4)

## 2019-01-20 LAB — GLUCOSE, CAPILLARY
Glucose-Capillary: 257 mg/dL — ABNORMAL HIGH (ref 70–99)
Glucose-Capillary: 178 mg/dL — ABNORMAL HIGH (ref 70–99)

## 2019-01-20 LAB — POTASSIUM: Potassium: 6.2 mmol/L — ABNORMAL HIGH (ref 3.5–5.1)

## 2019-01-20 MED ORDER — SODIUM CHLORIDE 0.9 % IV SOLN
INTRAVENOUS | Status: AC
  Administered 2019-01-20: 20:00:00 via INTRAVENOUS

## 2019-01-20 MED ORDER — INSULIN ASPART 100 UNIT/ML ~~LOC~~ SOLN
0.0000 [IU] | Freq: Every day | SUBCUTANEOUS | Status: DC
  Administered 2019-01-22: 2 [IU] via SUBCUTANEOUS

## 2019-01-20 MED ORDER — GLUCERNA SHAKE PO LIQD
237.0000 mL | Freq: Three times a day (TID) | ORAL | Status: DC
  Administered 2019-01-20 – 2019-01-25 (×14): 237 mL via ORAL

## 2019-01-20 MED ORDER — ACETAMINOPHEN 650 MG RE SUPP: 650.0000 mg | Freq: Four times a day (QID) | RECTAL | Status: DC | PRN

## 2019-01-20 MED ORDER — DEXTROSE 10 % IV SOLN: Freq: Once | INTRAVENOUS | Status: DC

## 2019-01-20 MED ORDER — ORAL CARE MOUTH RINSE
15.0000 mL | Freq: Two times a day (BID) | OROMUCOSAL | Status: DC
  Administered 2019-01-20 – 2019-01-25 (×8): 15 mL via OROMUCOSAL

## 2019-01-20 MED ORDER — ONDANSETRON HCL 4 MG PO TABS: 4.0000 mg | ORAL_TABLET | Freq: Four times a day (QID) | ORAL | Status: DC | PRN

## 2019-01-20 MED ORDER — SODIUM ZIRCONIUM CYCLOSILICATE 5 G PO PACK
10.0000 g | PACK | Freq: Once | ORAL | Status: AC
  Administered 2019-01-20: 10 g via ORAL
  Filled 2019-01-20: qty 2

## 2019-01-20 MED ORDER — SODIUM ZIRCONIUM CYCLOSILICATE 10 G PO PACK
10.0000 g | PACK | Freq: Once | ORAL | Status: AC
  Administered 2019-01-21: 10 g via ORAL
  Filled 2019-01-20 (×2): qty 1

## 2019-01-20 MED ORDER — DEXTROSE 50 % IV SOLN
1.0000 | Freq: Once | INTRAVENOUS | Status: AC
  Administered 2019-01-20: 50 mL via INTRAVENOUS

## 2019-01-20 MED ORDER — ONDANSETRON HCL 4 MG/2ML IJ SOLN
4.0000 mg | Freq: Four times a day (QID) | INTRAMUSCULAR | Status: DC | PRN
  Administered 2019-01-21: 4 mg via INTRAVENOUS
  Filled 2019-01-20: qty 2

## 2019-01-20 MED ORDER — ALBUTEROL SULFATE (2.5 MG/3ML) 0.083% IN NEBU
10.0000 mg | INHALATION_SOLUTION | Freq: Once | RESPIRATORY_TRACT | Status: AC
  Administered 2019-01-20: 10 mg via RESPIRATORY_TRACT
  Filled 2019-01-20: qty 12

## 2019-01-20 MED ORDER — SODIUM BICARBONATE 8.4 % IV SOLN
50.0000 meq | Freq: Once | INTRAVENOUS | Status: AC
  Administered 2019-01-20: 50 meq via INTRAVENOUS
  Filled 2019-01-20: qty 50

## 2019-01-20 MED ORDER — POLYETHYLENE GLYCOL 3350 17 G PO PACK: 17.0000 g | PACK | Freq: Every day | ORAL | Status: DC | PRN

## 2019-01-20 MED ORDER — ENSURE ENLIVE PO LIQD: 237.0000 mL | Freq: Two times a day (BID) | ORAL | Status: DC

## 2019-01-20 MED ORDER — ACETAMINOPHEN 325 MG PO TABS
650.0000 mg | ORAL_TABLET | Freq: Four times a day (QID) | ORAL | Status: DC | PRN
  Administered 2019-01-21: 650 mg via ORAL
  Filled 2019-01-20: qty 2

## 2019-01-20 MED ORDER — SODIUM CHLORIDE 0.9% FLUSH: 3.0000 mL | Freq: Once | INTRAVENOUS | Status: DC

## 2019-01-20 MED ORDER — DEXTROSE 50 % IV SOLN
INTRAVENOUS | Status: AC
  Filled 2019-01-20: qty 50

## 2019-01-20 MED ORDER — INSULIN ASPART 100 UNIT/ML IV SOLN
5.0000 [IU] | Freq: Once | INTRAVENOUS | Status: AC
  Administered 2019-01-20: 5 [IU] via INTRAVENOUS

## 2019-01-20 MED ORDER — SODIUM CHLORIDE 0.9 % IV BOLUS
250.0000 mL | Freq: Once | INTRAVENOUS | Status: AC
  Administered 2019-01-20: 250 mL via INTRAVENOUS

## 2019-01-20 MED ORDER — INSULIN ASPART 100 UNIT/ML ~~LOC~~ SOLN
0.0000 [IU] | Freq: Three times a day (TID) | SUBCUTANEOUS | Status: DC
  Administered 2019-01-21: 2 [IU] via SUBCUTANEOUS
  Administered 2019-01-21: 3 [IU] via SUBCUTANEOUS
  Administered 2019-01-22: 2 [IU] via SUBCUTANEOUS
  Administered 2019-01-22: 3 [IU] via SUBCUTANEOUS
  Administered 2019-01-23: 1 [IU] via SUBCUTANEOUS
  Administered 2019-01-23 – 2019-01-24 (×2): 2 [IU] via SUBCUTANEOUS
  Administered 2019-01-24: 5 [IU] via SUBCUTANEOUS
  Administered 2019-01-24: 3 [IU] via SUBCUTANEOUS
  Administered 2019-01-25: 2 [IU] via SUBCUTANEOUS
  Administered 2019-01-25: 5 [IU] via SUBCUTANEOUS
  Administered 2019-01-25: 1 [IU] via SUBCUTANEOUS
  Administered 2019-01-26: 2 [IU] via SUBCUTANEOUS

## 2019-01-20 MED ORDER — INSULIN ASPART 100 UNIT/ML ~~LOC~~ SOLN
0.0000 [IU] | Freq: Three times a day (TID) | SUBCUTANEOUS | Status: DC
  Administered 2019-01-20: 5 [IU] via SUBCUTANEOUS

## 2019-01-20 NOTE — ED Provider Notes (Signed)
[facility] EMERGENCY DEPARTMENT Provider Note   CSN: [id number] Arrival date & time: 01/20/19  1232    History   Chief Complaint Chief Complaint  Patient presents with  . Abnormal Labs    HPI Ellen Hunt is a 68 y.o. female.     Patient complains of weakness.  She was seen by her oncologist who checked her potassium it was elevated 6.6.  He also felt like she was dehydrated.  She was sent to the emergency department for admission and treatment of hyperkalemia and dehydration  The history is provided by the patient. No language interpreter was used.  Weakness  Severity:  Moderate Onset quality:  Gradual Timing:  Constant Progression:  Worsening Chronicity:  New Context: not alcohol use   Relieved by:  Nothing Worsened by:  Nothing Ineffective treatments:  None tried Associated symptoms: no abdominal pain, no chest pain, no cough, no diarrhea, no frequency, no headaches and no seizures   Risk factors: anemia     Past Medical History:  Diagnosis Date  . Diabetes mellitus without complication (HCC)    on insulin pump  . Dysrhythmia   . 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 Family history of breast cancer   . GERD (gastroesophageal reflux disease)   . History of blood transfusion   . History of bronchitis   . History of chemotherapy   . History of urinary tract infection   . Hyperlipidemia   . Hypertension   . Low serum vitamin D   . Ovarian cancer ([place]) 05/09/2016  . Shingles     Patient Active Problem List   Diagnosis Date Noted  . Malignant neoplasm of ovary ([place])   . Diabetes mellitus due to underlying condition with diabetic nephropathy, with long-term current use of insulin ([place])   . Ascites, malignant 11/28/2018  . Hyperkalemia 10/08/2018  . Severe dehydration 10/08/2018  . Hypermagnesemia 10/08/2018  . Hyperglycemia 10/08/2018  . Insulin pump in place 10/08/2018  . Acute renal failure superimposed on stage 3 chronic kidney  disease ([place]) 10/08/2018  . Malignant ascites 07/02/2018  . Drug-induced hypotension 06/11/2018  . CKD (chronic kidney disease), stage III ([place]) 03/08/2018  . Hypomagnesemia 02/08/2018  . 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 Elevated liver enzymes 08/25/2017  . Pancytopenia, acquired ([place]) 08/14/2017  . E-coli UTI 08/05/2017  . Thrombocytopenia ([place]) 08/04/2017  . Sepsis secondary to UTI ([place]) 08/04/2017  . AKI (acute kidney injury) ([place]) 08/04/2017  . Goals of care, counseling/discussion 05/14/2017  . Edema leg 12/18/2016  . Influenza with pneumonia 11/20/2016  . Sepsis ([place]) 11/20/2016  . Antineoplastic chemotherapy induced pancytopenia ([place]) 11/20/2016  . HCAP (healthcare-associated pneumonia) 11/19/2016  . Infection of urinary tract 10/14/2016  . B12 deficiency 08/31/2016  . Anemia due to antineoplastic chemotherapy 08/09/2016  . 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 Glaucoma 08/07/2016  . Genetic testing 06/27/2016  . Family history of breast cancer   . Shingles 05/09/2016  . 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 Nausea with vomiting 04/30/2016  . Sciatica neuralgia 02/12/2015  . Severe obesity (BMI >= 40) ([place]) 10/12/2014  . Hypertension 09/04/2014  . Obesity 09/04/2014  . Type 2 diabetes mellitus with complication, with long term current use of insulin pump ([place]) 01/25/2014  . Vitamin D insufficiency 01/25/2014  . Hyperlipidemia 01/25/2014  . Osteopenia 01/25/2014    Past Surgical History:  Procedure Laterality Date  . ABDOMINAL HYSTERECTOMY  10/14/2016  . CESAREAN SECTION    .  DEBULKING N/A 10/14/2016   Procedure: DEBULKING;  Surgeon: Everitt Amber, MD;  Location: [facility];  Service: Gynecology;  Laterality: N/A;  . IR PARACENTESIS  06/01/2017  . LAPAROTOMY WITH STAGING N/A 10/14/2016   Procedure: LAPAROTOMY WITH OMENTECTOMY AND TUMOR DEBULGING;  Surgeon: Everitt Amber, MD;  Location: [facility];  Service:  Gynecology;  Laterality: N/A;  . LUMBAR FUSION  08/21/15   L3-L4 Dr. Timmothy Euler  . OMENTECTOMY N/A 10/14/2016   Procedure: OMENTECTOMY;  Surgeon: Everitt Amber, MD;  Location: [facility];  Service: Gynecology;  Laterality: N/A;  . ROBOTIC ASSISTED TOTAL HYSTERECTOMY WITH BILATERAL SALPINGO OOPHERECTOMY Bilateral 10/14/2016   Procedure: XI ROBOTIC ASSISTED TOTAL LAPARSCOPIC  HYSTERECTOMY WITH BILATERAL SALPINGO OOPHORECTOMY;  Surgeon: Everitt Amber, MD;  Location: [facility];  Service: Gynecology;  Laterality: Bilateral;     OB History   No obstetric history on file.      Home Medications    Prior to Admission medications   Medication Sig Start Date End Date Taking? Authorizing Provider  atorvastatin (LIPITOR) 40 MG tablet Take 1 tablet (40 mg total) by mouth daily. 10/07/18   Eustaquio Maize, MD  Biotin 1000 MCG tablet Take 1,000 mcg by mouth daily.     [provider]  furosemide (LASIX) 20 MG tablet TAKE 1 TABLET(20 MG) BY MOUTH TWICE DAILY 01/03/19   Lockamy, Randi L, NP-C  glucose blood (ONE TOUCH ULTRA TEST) test strip Use to test blood sugar 6-8 times daily 11/01/18   Philemon Kingdom, MD  HYDROcodone-acetaminophen (NORCO) 10-325 MG tablet Take 1 tablet by mouth every 4 (four) hours as needed. 01/05/19   Lockamy, Randi L, NP-C  insulin lispro (HUMALOG) 100 UNIT/ML injection USE IN INSULIN PUMP AS DIRECTED. MAX DAILY DOSE OF 110 UNITS PER DAY. Patient taking differently: Inject 110 Units into the skin See admin instructions. USE IN INSULIN PUMP AS DIRECTED. MAX DAILY DOSE OF 110 UNITS PER DAY. 09/27/18   Philemon Kingdom, MD  lidocaine-prilocaine (EMLA) cream Apply a quarter size amount to port site 1 hour prior to chemo. Do not rub in. Cover with plastic wrap. 12/09/17   Heath Lark, MD  magnesium oxide (MAG-OX) 400 MG tablet Take 400 mg by mouth 2 (two) times daily. Pt taking once a day    [provider]  Multiple Vitamin (MULTIVITAMIN WITH MINERALS) TABS Take 1 tablet by mouth  daily.    [provider]  omeprazole (PRILOSEC) 40 MG capsule Take 1 capsule (40 mg total) by mouth daily. 09/15/18   Derek Jack, MD  polyethylene glycol (MIRALAX / Floria Raveling) packet Take 17 g by mouth daily as needed for mild constipation.     [provider]  prochlorperazine (COMPAZINE) 10 MG tablet Take 1 tablet (10 mg total) by mouth every 6 (six) hours as needed for nausea or vomiting. 07/16/18   Heath Lark, MD  RUBRACA 300 MG tablet Take 1 tablet (300 mg total) by mouth daily. 01/05/19   Derek Jack, MD  vitamin B-12 (CYANOCOBALAMIN) 1000 MCG tablet Take 1,000 mcg by mouth daily.    [provider]  vitamin E (VITAMIN E) 400 UNIT capsule Take 400 Units by mouth daily.    [provider]    Family History Family History  Problem Relation Age of Onset  . Lung cancer Mother        smoker; dx in her 60s  . Leukemia Father   . Diabetes Paternal Grandmother   . Heart attack Paternal Grandmother   . Diabetes  Paternal Grandfather   . Breast cancer Paternal Aunt        dx in her 64s-30s  . Leukemia Paternal Uncle   . Heart attack Maternal Grandfather   . Breast cancer Cousin        maternal first cousin    Social History Social History   Tobacco Use  . Smoking status: Never Smoker  . Smokeless tobacco: Never Used  Substance Use Topics  . Alcohol use: No  . Drug use: No     Allergies   Avandia [rosiglitazone]; Micronase [glyburide]; and Actos [pioglitazone]   Review of Systems Review of Systems  Constitutional: Negative for appetite change and fatigue.  HENT: Negative for congestion, ear discharge and sinus pressure.   Eyes: Negative for discharge.  Respiratory: Negative for cough.   Cardiovascular: Negative for chest pain.  Gastrointestinal: Negative for abdominal pain and diarrhea.  Genitourinary: Negative for frequency and hematuria.  Musculoskeletal: Negative for back pain.  Skin: Negative for rash.   Neurological: Positive for weakness. Negative for seizures and headaches.  Psychiatric/Behavioral: Negative for hallucinations.     Physical Exam Updated Vital Signs BP 116/67   Pulse (!) 106   Temp 98.1 F (36.7 C) (Oral)   Resp (!) 27   Ht 5\' 2"  (1.575 m)   Wt 81.2 kg Comment: husband states that pt. fluctuates weight a lot  SpO2 100%   BMI 32.74 kg/m   Physical Exam Vitals signs and nursing note reviewed.  Constitutional:      Appearance: She is well-developed.  HENT:     Head: Normocephalic.  Eyes:     General: No scleral icterus.    Conjunctiva/sclera: Conjunctivae normal.  Neck:     Musculoskeletal: Neck supple.     Thyroid: No thyromegaly.  Cardiovascular:     Rate and Rhythm: Normal rate and regular rhythm.     Heart sounds: No murmur. No friction rub. No gallop.   Pulmonary:     Breath sounds: No stridor. No wheezing or rales.  Chest:     Chest wall: No tenderness.  Abdominal:     General: There is no distension.     Tenderness: There is no abdominal tenderness. There is no rebound.  Musculoskeletal: Normal range of motion.  Lymphadenopathy:     Cervical: No cervical adenopathy.  Skin:    Findings: No erythema or rash.  Neurological:     Mental Status: She is alert and oriented to person, place, and time.     Motor: No abnormal muscle tone.     Coordination: Coordination normal.  Psychiatric:        Behavior: Behavior normal.      ED Treatments / Results  Labs (all labs ordered are listed, but only abnormal results are displayed) Labs Reviewed  COMPREHENSIVE METABOLIC PANEL - Abnormal; Notable for the following components:      Result Value   Sodium 126 (*)    Potassium 6.6 (*)    Chloride 96 (*)    CO2 19 (*)    Glucose, Bld 197 (*)    BUN 67 (*)    Creatinine, Ser 2.27 (*)    Calcium 8.1 (*)    Total Protein 5.3 (*)    Albumin 2.2 (*)    GFR calc non Af Amer 22 (*)    GFR calc Af Amer 25 (*)    All other components within normal  limits  CBC WITH DIFFERENTIAL/PLATELET - Abnormal; Notable for the following components:   WBC  11.5 (*)    RBC 2.98 (*)    Hemoglobin 9.6 (*)    HCT 30.6 (*)    MCV 102.7 (*)    RDW 18.7 (*)    Platelets 34 (*)    Neutro Abs 10.2 (*)    Lymphs Abs 0.3 (*)    Abs Immature Granulocytes 0.10 (*)    All other components within normal limits  CULTURE, BLOOD (ROUTINE X 2)  CULTURE, BLOOD (ROUTINE X 2)  LACTIC ACID, PLASMA  PROTIME-INR  LACTIC ACID, PLASMA  URINALYSIS, ROUTINE W REFLEX MICROSCOPIC    EKG None  Radiology Dg Chest 2 View  Result Date: 01/20/2019 CLINICAL DATA:  68 year old female with a history of shortness of breath EXAM: CHEST - 2 VIEW COMPARISON:  11/28/2018, 09/30/2017, 08/04/2017 FINDINGS: Cardiomediastinal silhouette unchanged in size and contour. No evidence of central vascular congestion. No interlobular septal thickening. No pneumothorax or pleural effusion. Right IJ port catheter, unchanged. No confluent airspace disease.  Low lung volumes. IMPRESSION: Negative for acute cardiopulmonary disease. Right IJ port catheter. Electronically Signed   By: Corrie Mckusick D.O.   On: 01/20/2019 14:04    Procedures Procedures (including critical care time)  Medications Ordered in ED Medications  sodium chloride flush (NS) 0.9 % injection 3 mL (has no administration in time range)  sodium zirconium cyclosilicate (LOKELMA) packet 10 g (has no administration in time range)  sodium chloride 0.9 % bolus 250 mL (0 mLs Intravenous Stopped 01/20/19 1520)  insulin aspart (novoLOG) injection 5 Units (5 Units Intravenous Given 01/20/19 1511)    And  dextrose 50 % solution 50 mL (50 mLs Intravenous Given 01/20/19 1508)  sodium bicarbonate injection 50 mEq (50 mEq Intravenous Given 01/20/19 1513)  albuterol (PROVENTIL) (2.5 MG/3ML) 0.083% nebulizer solution 10 mg (10 mg Nebulization Given 01/20/19 1517)     Initial Impression / Assessment and Plan / ED Course  I have reviewed the  triage vital signs and the nursing notes.  Pertinent labs & imaging results that were available during my care of the patient were reviewed by me and considered in my medical decision making (see chart for details).       CRITICAL CARE Performed by: Milton Ferguson Total critical care time:5minutes Critical care time was exclusive of separately billable procedures and treating other patients. Critical care was necessary to treat or prevent imminent or life-threatening deterioration. Critical care was time spent personally by me on the following activities: development of treatment plan with patient and/or surrogate as well as nursing, discussions with consultants, evaluation of patient's response to treatment, examination of patient, obtaining history from patient or surrogate, ordering and performing treatments and interventions, ordering and review of laboratory studies, ordering and review of radiographic studies, pulse oximetry and re-evaluation of patient's condition. Patient with hyperkalemia.  She will be admitted to medicine  Final Clinical Impressions(s) / ED Diagnoses   Final diagnoses:  Hyperkalemia    ED Discharge Orders    None       Milton Ferguson, MD 01/20/19 1527

## 2019-01-20 NOTE — Progress Notes (Signed)
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 This may potentially be ovarian or a GI in etiology.    04/30/2016 Tumor Marker    CA 125- 7149.0 (H)    05/01/2016 Procedure    US paracentesis- Successful ultrasound-guided paracentesis yielding 1.8 liters of peritoneal fluid.    05/01/2016 Imaging    US pelvis- Both transabdominal and transvaginal sonography are significantly limited by large patient habitus and ascites. Neither uterus or ovaries were visualized on this exam.    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    05/08/2016 Imaging    CT chest- No evidence of metastatic disease in the chest. Peritoneal/omental disease with abdominal ascites in the upper abdomen, incompletely visualized.     05/13/2016 Procedure    Placement of single lumen port a cath via right internal jugular vein. The catheter tip lies at the cavoatrial junction. A power injectable port a cath was placed and is ready for immediate use.    05/15/2016 Procedure    US Paracentesis- 3400 ml yellow colored ascites removed    05/15/2016 - 09/18/2016 Chemotherapy    Carboplatin/Paclitaxel every 21 days x 7 cycles    07/01/2016 Miscellaneous    Genetic Counseling by Ellen Hunt-  Genetic testing was  normal, and did not reveal a deleterious mutation in these genes.     07/08/2016 Imaging    CT CAP- 1. Small volume ascites, significantly decreased. 2. Stable diffuse omental soft tissue caking and diffuse peritoneal thickening along the bilateral paracolic gutters and bilateral pelvic peritoneal reflections, consistent with peritoneal carcinomatosis. 3. Stable asymmetrically enlarged right ovary, which may represent the primary site of ovarian malignancy. 4. No evidence of metastatic disease in the chest. No new sites of metastatic disease in the abdomen or pelvis.    07/09/2016 Miscellaneous    Gyn Onc re-evaluation- modest response to therapy, 3 more cycles of chemotherapy recommended.      09/11/2016 Imaging    CT C/A/P No significant change omental soft tissue caking, consistent with metastatic disease. Mild ascites is decreased since previous study.  Increased calcification along peritoneal surface in pelvic cul-de-sac, consistent with treated peritoneal metastatic disease.  Stable 4.5cm homogeneous right pelvic mass, which favors a uterine fibroid although right ovarian neoplasm cannot definitely be excluded.  No new or progressive metastatic disease identified. No evidence of metastatic disease within the thorax.     10/14/2016 Procedure    Robotic-assisted laparoscopic total hysterectomy with bilateral salpingoophorectomy, ex lap omentectomy, radical tumor debulking by Dr. Denman Hunt    10/17/2016 Pathology Results    Diagnosis 1. 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 - CERVIX AND ENDOMETRIUM FREE OF TUMOR. - SEE ONCOLOGY TABLE AND COMMENT. 2. Soft tissue, biopsy, umbilical nodule -  HIGH GRADE SEROUS CARCINOMA. 3. Omentum, resection for tumor - HIGH GRADE SEROUS CARCINOMA, 33 CM.    11/06/2016 - 12/25/2016 Chemotherapy    Carboplatin/Paclitaxel x 3 cycles      01/12/2017 Imaging    CT CAP- 1. Interval hysterectomy, bilateral salpingo-oophorectomy and omentectomy without evidence of tumor recurrence. 2. No evidence of metastatic disease. 3. 5 mm nonobstructing lower pole left renal calculus.    01/13/2017 Remission    No evidence of residual disease on CT imaging.    05/04/2017 Imaging    CT CAP- New small amount of ascites within the abdomen and pelvis since 01/12/2017 which could indicate disease progression but no new identifiable tumor and no significant change in omental and mild peritoneal thickening.  No evidence of metastatic disease within the chest.  Coronary artery disease.  Aortic Atherosclerosis (ICD10-I70.0).    05/04/2017 Tumor Marker    Patient's tumor was tested for the following markers: CA 125 Results of the tumor marker test revealed 8850    05/18/2017 Imaging    ECHO; EF 60% -  65    05/25/2017 - 07/07/2017 Chemotherapy    She received Doxil and Avastin. Treatment is stopped due to disease progression    06/01/2017 Procedure    Successful ultrasound-guided therapeutic paracentesis yielding 2.8 liters of peritoneal fluid    06/22/2017 Tumor Marker    Patient's tumor was tested for the following markers: CA 125 Results of the tumor marker test revealed 13440    07/20/2017 Imaging    Mild increase in peritoneal carcinoma within abdomen pelvis since previous study. No significant change and minimal ascites.  No evidence of metastatic disease within the thorax. New mild airspace opacity in left lower lobe, consistent with inflammatory or infectious etiology.    07/30/2017 Tumor Marker    Patient's tumor was tested for the following markers: CA 125 Results of the tumor marker test revealed 12099    07/30/2017 - 06/14/2018 Chemotherapy    The patient had gemzar. Cisplatin is added on 11/02/18    08/03/2017 - 08/05/2017 Hospital Admission    She was admitted to the hospital for management of UTI and neutropenic fever     08/13/2017 Adverse Reaction    Dose of chemotherapy is reduced due to neutropenic sepsis    08/24/2017 Tumor Marker    Patient's tumor was tested for the following markers: CA 125 Results of the tumor marker test revealed 7431    09/21/2017 Tumor Marker    Patient's tumor was tested for the following markers: CA 125 Results of the tumor marker test revealed 4176    10/09/2017 Imaging    No significant change in peritoneal carcinomatosis since previous study.  No new or progressive metastatic disease within the abdomen or pelvis.  Stable tiny nonobstructive left renal calculus. No evidence of ureteral calculi or hydronephrosis.    11/10/2017 Tumor Marker    Patient's tumor was tested for the following markers: CA 125 Results of the tumor marker test revealed 4257    11/24/2017 Tumor Marker    Patient's tumor was tested for the following markers: CA 125 Results of the tumor marker test revealed 3377    01/12/2018 Tumor Marker    Patient's tumor was tested for the following markers: CA 125 Results of the tumor marker test revealed 1595    01/21/2018 Imaging    1. Stable to slightly improved omental, gastrohepatic ligament and pelvic disease as detailed above. No new/progressive findings. 2. No acute abdominal or pelvic findings. 3.  Stable lower pole left renal calculus.    02/08/2018 Tumor Marker    Patient's tumor was tested for the following markers: CA 125 Results of the tumor marker test revealed 1058    03/08/2018 Tumor Marker    Patient's tumor was tested for the following markers: CA-125 Results of the tumor marker test revealed 737.1    04/02/2018 Tumor Marker    Patient's tumor was tested for the following markers: CA-125 Results of the tumor marker test revealed 542.8    06/14/2018 Tumor Marker    CA 125- 1010    06/30/2018 Imaging    1. Mixed appearance but overall progression, with the left upper quadrant omental tumor slightly improved but with some potential new  deposition of tumor along the hepatic flexure margin. There is also new moderate ascites which must be considered suspicious for malignant ascites/peritoneal spread. 2. Other imaging findings of potential clinical significance: Nonobstructive left nephrolithiasis. Aortic Atherosclerosis (ICD10-I70.0). Mild subcutaneous edema along the sixth anterior abdominal wall potentially from mild panniculitis.    07/08/2018 Procedure    Successful ultrasound-guided paracentesis yielding 4 liters of peritoneal fluid.    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 Rayle 68 y.o. female returns for routine follow-up. She is here today with her husband. She states that she is experiencing weakness and can't stand to get out of her chair. She states that she is sleeping a lot. Denies any nausea, vomiting, or diarrhea. Denies any new pains. Had not noticed any recent bleeding such as epistaxis, hematuria or hematochezia. Denies recent chest pain on exertion, pre-syncopal episodes, or palpitations. Denies any numbness or tingling in hands or feet. Denies any recent fevers, infections, or recent hospitalizations. Patient reports appetite at 0% and energy level at 0%.  REVIEW OF SYSTEMS:  Review of Systems  Constitutional: Positive for fatigue.  Neurological: Positive for extremity weakness.     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[place]) 05/09/2016   Family  history of breast cancer    GERD (gastroesophageal reflux disease)    History of blood transfusion    History of bronchitis    History of chemotherapy    History of urinary tract infection    Hyperlipidemia    Hypertension    Low serum vitamin D    Ovarian cancer ([place]) 05/09/2016   Shingles    Past Surgical History:  Procedure Laterality Date   ABDOMINAL HYSTERECTOMY  10/14/2016   CESAREAN SECTION     DEBULKING N/A 10/14/2016   Procedure: DEBULKING;  Surgeon: Everitt Amber, MD;  Location: [facility];  Service: Gynecology;  Laterality: N/A;   IR PARACENTESIS  06/01/2017   LAPAROTOMY WITH STAGING N/A 10/14/2016   Procedure: LAPAROTOMY WITH OMENTECTOMY AND TUMOR DEBULGING;  Surgeon: Everitt Amber, MD;  Location: [facility];  Service: Gynecology;  Laterality: N/A;   LUMBAR FUSION  08/21/15   L3-L4 Dr. Timmothy Euler   OMENTECTOMY N/A 10/14/2016  Procedure: OMENTECTOMY;  Surgeon: Everitt Amber, MD;  Location: [facility];  Service: Gynecology;  Laterality: N/A;   ROBOTIC ASSISTED TOTAL HYSTERECTOMY WITH BILATERAL SALPINGO OOPHERECTOMY Bilateral 10/14/2016   Procedure: XI ROBOTIC ASSISTED TOTAL LAPARSCOPIC  HYSTERECTOMY WITH BILATERAL SALPINGO OOPHORECTOMY;  Surgeon: Everitt Amber, MD;  Location: [facility];  Service: Gynecology;  Laterality: Bilateral;     SOCIAL HISTORY:  Social History   Socioeconomic History   Marital status: Married    Spouse name: [name]   Number of children: 2   Years of education: Not on file   Highest education level: Not on file  Occupational History   Occupation: retired  Scientist, product/process development strain: Not on file   Food insecurity:    Worry: Not on file    Inability: Not on Lexicographer needs:    Medical: Not on file    Non-medical: Not on file  Tobacco Use   Smoking status: Never Smoker   Smokeless tobacco: Never Used  Substance and Sexual Activity   Alcohol use: No   Drug use: No   Sexual activity: Not on file    Comment:  married  Lifestyle   Physical activity:    Days per week: Not on file    Minutes per session: Not on file   Stress: Not on file  Relationships   Social connections:    Talks on phone: Not on file    Gets together: Not on file    Attends religious service: Not on file    Active member of club or organization: Not on file    Attends meetings of clubs or organizations: Not on file    Relationship status: Not on file   Intimate partner violence:    Fear of current or ex partner: Not on file    Emotionally abused: Not on file    Physically abused: Not on file    Forced sexual activity: Not on file  Other Topics Concern   Not on file  Social History Narrative   Not on file    FAMILY HISTORY:  Family History  Problem Relation Age of Onset   Lung cancer Mother        smoker; dx in her 28s   Leukemia Father    Diabetes Paternal Grandmother    Heart attack Paternal Grandmother    Diabetes Paternal Grandfather    Breast cancer Paternal Aunt        dx in her 83s-30s   Leukemia Paternal Uncle    Heart attack Maternal Grandfather    Breast cancer Cousin        maternal first cousin    CURRENT MEDICATIONS:  No facility-administered encounter medications on file as of 01/20/2019.    Outpatient Encounter Medications as of 01/20/2019  Medication Sig   atorvastatin (LIPITOR) 40 MG tablet Take 1 tablet (40 mg total) by mouth daily.   Biotin 1000 MCG tablet Take 1,000 mcg by mouth daily.    furosemide (LASIX) 20 MG tablet TAKE 1 TABLET(20 MG) BY MOUTH TWICE DAILY   glucose blood (ONE TOUCH ULTRA TEST) test strip Use to test blood sugar 6-8 times daily   HYDROcodone-acetaminophen (NORCO) 10-325 MG tablet Take 1 tablet by mouth every 4 (four) hours as needed.   insulin lispro (HUMALOG) 100 UNIT/ML injection USE IN INSULIN PUMP AS DIRECTED. MAX DAILY DOSE OF 110 UNITS PER DAY. (Patient taking differently: Inject 110 Units into the skin See admin instructions. USE IN  INSULIN  PUMP AS DIRECTED. MAX DAILY DOSE OF 110 UNITS PER DAY.)   lidocaine-prilocaine (EMLA) cream Apply a quarter size amount to port site 1 hour prior to chemo. Do not rub in. Cover with plastic wrap.   magnesium oxide (MAG-OX) 400 MG tablet Take 400 mg by mouth 2 (two) times daily. Pt taking once a day   Multiple Vitamin (MULTIVITAMIN WITH MINERALS) TABS Take 1 tablet by mouth daily.   omeprazole (PRILOSEC) 40 MG capsule Take 1 capsule (40 mg total) by mouth daily.   polyethylene glycol (MIRALAX / GLYCOLAX) packet Take 17 g by mouth daily as needed for mild constipation.    prochlorperazine (COMPAZINE) 10 MG tablet Take 1 tablet (10 mg total) by mouth every 6 (six) hours as needed for nausea or vomiting.   RUBRACA 300 MG tablet Take 1 tablet (300 mg total) by mouth daily.   vitamin B-12 (CYANOCOBALAMIN) 1000 MCG tablet Take 1,000 mcg by mouth daily.   vitamin E (VITAMIN E) 400 UNIT capsule Take 400 Units by mouth daily.    ALLERGIES:  Allergies  Allergen Reactions   Avandia [Rosiglitazone] Other (See Comments)    Legs swelled   Micronase [Glyburide] Swelling   Actos [Pioglitazone] Other (See Comments)    Edema / leg swelling     PHYSICAL EXAM:  ECOG Performance status: 3  Vitals:   01/20/19 1123  BP: (!) 95/56  Pulse: (!) 117  Resp: (!) 28  Temp: 97.8 F (36.6 C)  SpO2: 97%   Filed Weights    Physical Exam Constitutional:      Appearance: Normal appearance.  Cardiovascular:     Rate and Rhythm: Regular rhythm. Tachycardia present.     Heart sounds: Normal heart sounds.  Pulmonary:     Effort: Respiratory distress present.  Abdominal:     Palpations: Abdomen is soft.     Comments: Distended with ascites present.  Musculoskeletal:     Left lower leg: Edema present.  Skin:    General: Skin is warm.  Neurological:     General: No focal deficit present.     Mental Status: She is alert and oriented to person, place, and time.  Psychiatric:         Mood and Affect: Mood normal.        Behavior: Behavior normal.      LABORATORY DATA:  I have reviewed the labs as listed.  CBC    Component Value Date/Time   WBC 11.5 (H) 01/20/2019 1339   RBC 2.98 (L) 01/20/2019 1339   HGB 9.6 (L) 01/20/2019 1339   HGB 9.6 (L) 11/02/2017 0756   HCT 30.6 (L) 01/20/2019 1339   HCT 21.0 (L) 10/11/2018 0556   HCT 30.6 (L) 11/02/2017 0756   PLT 34 (L) 01/20/2019 1339   PLT 150 11/02/2017 0756   PLT 40 (LL) 11/18/2016 1642   MCV 102.7 (H) 01/20/2019 1339   MCV 108.5 (H) 11/02/2017 0756   MCH 32.2 01/20/2019 1339   MCHC 31.4 01/20/2019 1339   RDW 18.7 (H) 01/20/2019 1339   RDW 17.8 (H) 11/02/2017 0756   LYMPHSABS 0.3 (L) 01/20/2019 1339   LYMPHSABS 0.4 (L) 11/02/2017 0756   MONOABS 0.8 01/20/2019 1339   MONOABS 0.5 11/02/2017 0756   EOSABS 0.0 01/20/2019 1339   EOSABS 0.7 (H) 11/02/2017 0756   EOSABS 0.1 11/18/2016 1642   BASOSABS 0.0 01/20/2019 1339   BASOSABS 0.0 11/02/2017 0756   CMP Latest Ref Rng & Units 01/20/2019 01/20/2019 01/04/2019  Glucose 70 -  99 mg/dL 197(H) 186(H) 167(H)  BUN 8 - 23 mg/dL 67(H) 64(H) 45(H)  Creatinine 0.44 - 1.00 mg/dL 2.27(H) 2.17(H) 2.00(H)  Sodium 135 - 145 mmol/L 126(L) 127(L) 133(L)  Potassium 3.5 - 5.1 mmol/L 6.6(HH) 6.6(HH) 5.0  Chloride 98 - 111 mmol/L 96(L) 97(L) 99  CO2 22 - 32 mmol/L 19(L) 19(L) 21(L)  Calcium 8.9 - 10.3 mg/dL 8.1(L) 8.3(L) 8.2(L)  Total Protein 6.5 - 8.1 g/dL 5.3(L) 5.3(L) 5.2(L)  Total Bilirubin 0.3 - 1.2 mg/dL 0.8 0.7 0.8  Alkaline Phos 38 - 126 U/L 94 97 75  AST 15 - 41 U/L 28 34 27  ALT 0 - 44 U/L '21 21 15       ' DIAGNOSTIC IMAGING:  I have independently reviewed the scans and discussed with the patient.   I have reviewed Venita Lick LPN's note and agree with the documentation.  I personally performed a face-to-face visit, made revisions and my assessment and plan is as follows.    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 -Underwent 7 cycles of chemotherapy with carboplatin and paclitaxel from 06/01/2016 through 09/18/2016. -Germline mutation testing on 06/25/2016 was negative. - Underwent robotic assisted tumor debulking surgery on 10/14/2016 by Dr. Denman Hunt. -3 cycles of chemotherapy with carboplatin and paclitaxel from 11/06/2016 through 12/25/2016. - Second line therapy with Doxil and bevacizumab from 05/25/2017 through 07/07/2017. - Third line therapy with gemcitabine from 07/30/2017 through December 2018, gemcitabine and cisplatin day 1 and day 8 q. 21 days from 11/02/2017, 7 cycles completed on 06/14/2018, with CT scan on 07/01/2018 showing progression. - 3 cycles of docetaxel 75 mg/m from 07/16/2018 through 08/26/2018. -CT CAP on 09/13/2018 showed progression of disease.  Malignant ascites present. 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 - Rucaparib 600 mg twice daily started on 09/25/2018. - 7 days after starting rucaparib, she started feeling very weak and nauseous.  Rucaparib was held on 10/05/2018 due to severe weakness.  -She was admitted to the hospital on 10/08/2018 through 10/11/2018 with weakness, and renal failure. -CT abdomen on 10/09/2018 showed stable omental and peritoneal disease. - I have decreased rucaparib to 300 mg twice a day on 10/15/2018. -Rucaparib was further dose reduced to 300 mg once daily on 11/08/2018 secondary to elevated creatinine of 2.0. -She is tolerating current dose of rucaparib 300 mg daily very well. -Ca1 25 on 09/06/2018 was 2798.  This is down to 1571 on 11/25/2018. -She was hospitalized on 11/28/2018 through 11/29/2018 with some shortness of breath and had paracentesis done. - CT abdomen and pelvis on 12/10/2018 shows extensive caking of the tumor along the omentum.  Peritoneal implant along the transverse  colon measures 5.3 x 3.6 cm, formerly 4.2 x 3.2 cm.  Caking of the tumor along the gastrohepatic ligament and below the stomach is mildly more prominent, with with a band of tumor below the stomach measuring 2 cm in thickness, formerly 1.7 cm.  No new areas were seen. -Because of her poor performance status, we have decided to continue same treatment for couple of more months to repeat scans. - Husband reported that she has become progressively weaker. -We reviewed her blood work.  She had many electrolyte abnormalities including hyponatremia, hyperkalemia and slight worsening of her baseline elevated creatinine. - She does require hospitalization as she is very weak.  I have called and  talked to Dr. Roderic Palau in the ER to facilitate admission. - I have discontinued rucaparib.  I will talk to her about discontinuing all forms of therapy and best supportive care in the form of hospice in the next few days.  2.  Malignant ascites: -Paracentesis on 07/08/2018, 4 L removed. -Paracentesis on 09/21/2018, 7 L removed. -Paracentesis on 10/11/2018 with 4 L removed. -Paracentesis on 11/11/2018 with 5 L of yellow fluid removed. -Paracentesis on 11/29/2018, with 9 L fluid removed. -Paracentesis on 12/08/2018 with 5 L removed. -Paracentesis on 12/17/2018 with 6 L fluid removed. -Paracentesis on 12/31/2018 with 8.3 L removed. -Paracentesis on 01/13/2019. -She was taking Lasix 20 mg every other day.   Total time spent is 25 minutes with more than 50% of the time spent face-to-face discussing lab results, further plan of action and coordination of care.    Orders placed this encounter:  No orders of the defined types were placed in this encounter.     Derek Jack, MD [place] [phone]

## 2019-01-20 NOTE — ED Notes (Signed)
CRITICAL VALUE ALERT  Critical Value:  K 6.6  Date & Time Notied:  01/20/2019, 1422  Provider Notified:  Dr. Roderic Palau  Orders Received/Actions taken:  See chart

## 2019-01-20 NOTE — ED Notes (Signed)
ED TO INPATIENT HANDOFF REPORT  ED Nurse Name and Phone #: [phone]  S Name/Age/Gender Ellen Hunt 68 y.o. female Room/Bed: APA19/APA19  Code Status   Code Status: Prior  Home/SNF/Other Home Patient oriented to: self Is this baseline? Yes   Triage Complete: Triage complete  Chief Complaint Sent by [place]  Triage Note Pt came down from cancer center, husband states that patient's WBC is high, platelets are low, K+ is "very high", and BP is 95/56.    Allergies Allergies  Allergen Reactions  . Avandia [Rosiglitazone] Other (See Comments)    Legs swelled  . Micronase [Glyburide] Swelling  . Actos [Pioglitazone] Other (See Comments)    Edema / leg swelling    Level of Care/Admitting Diagnosis ED Disposition    ED Disposition Condition Comment   Admit  Hospital Area: [facility] [[id number]]  Level of Care: Telemetry [5]  Diagnosis: Hyperkalemia [323557]  Admitting Physician: Bethena Roys [[id number]]  Attending Physician: Bethena Roys Nessa.Cuff  PT Class (Do Not Modify): Observation [104]  PT Acc Code (Do Not Modify): Observation [10022]       B Medical/Surgery History Past Medical History:  Diagnosis Date  . Diabetes mellitus without complication (HCC)    on insulin pump  . Dysrhythmia   . 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 Family history of breast cancer   . GERD (gastroesophageal reflux disease)   . History of blood transfusion   . History of bronchitis   . History of chemotherapy   . History of urinary tract infection   . Hyperlipidemia   . Hypertension   . Low serum vitamin D   . Ovarian cancer ([employer]) 05/09/2016  . Shingles    Past Surgical History:  Procedure Laterality Date  . ABDOMINAL HYSTERECTOMY  10/14/2016  . CESAREAN SECTION    . DEBULKING N/A 10/14/2016   Procedure: DEBULKING;  Surgeon: Everitt Amber, MD;  Location: [facility];  Service: Gynecology;  Laterality: N/A;  . IR PARACENTESIS  06/01/2017   . LAPAROTOMY WITH STAGING N/A 10/14/2016   Procedure: LAPAROTOMY WITH OMENTECTOMY AND TUMOR DEBULGING;  Surgeon: Everitt Amber, MD;  Location: [facility];  Service: Gynecology;  Laterality: N/A;  . LUMBAR FUSION  08/21/15   L3-L4 Dr. Timmothy Euler  . OMENTECTOMY N/A 10/14/2016   Procedure: OMENTECTOMY;  Surgeon: Everitt Amber, MD;  Location: [facility];  Service: Gynecology;  Laterality: N/A;  . ROBOTIC ASSISTED TOTAL HYSTERECTOMY WITH BILATERAL SALPINGO OOPHERECTOMY Bilateral 10/14/2016   Procedure: XI ROBOTIC ASSISTED TOTAL LAPARSCOPIC  HYSTERECTOMY WITH BILATERAL SALPINGO OOPHORECTOMY;  Surgeon: Everitt Amber, MD;  [facility];  Service: Gynecology;  Laterality: Bilateral;     A IV Location/Drains/Wounds Patient Lines/Drains/Airways Status   Active Line/Drains/Airways    Name:   Placement date:   Placement time:   Site:   Days:   Implanted Port 05/13/16 Right Chest   05/13/16    1012    Chest   982   Implanted Port 11/28/18 Right   11/28/18    1803    -   53   Implanted Port 01/20/19 Right Chest   01/20/19    1322    Chest   less than 1   Airway   10/14/16    1117     828   Incision (Closed) 10/14/16 Abdomen Other (Comment)   10/14/16    1057     828   Incision (Closed) 10/14/16 Perineum Other (Comment)   10/14/16  1100     828   Incision - 4 Ports Abdomen 1: Right;Lateral 2: Left;Medial 3: Left;Lateral;Lower 4: Left;Lateral;Upper   10/14/16    0811     828   Wound / Incision (Open or Dehisced) 11/28/18 Non-pressure wound Buttocks Right 2cm x 2cm   11/28/18    2245    Buttocks   53          Intake/Output Last 24 hours  Intake/Output Summary (Last 24 hours) at 01/20/2019 1652 Last data filed at 01/20/2019 1520 Gross per 24 hour  Intake 250 ml  Output -  Net 250 ml    Labs/Imaging Results for orders placed or performed during the hospital encounter of 01/20/19 (from the past 48 hour(s))  Comprehensive metabolic panel     Status: Abnormal   Collection Time: 01/20/19  1:39 PM  Result  Value Ref Range   Sodium 126 (L) 135 - 145 mmol/L   Potassium 6.6 (HH) 3.5 - 5.1 mmol/L    Comment: CRITICAL RESULT CALLED TO, READ BACK BY AND VERIFIED WITH: CARWELL,L AT 1422 ON 3.19.20 BY ISLEY,B    Chloride 96 (L) 98 - 111 mmol/L   CO2 19 (L) 22 - 32 mmol/L   Glucose, Bld 197 (H) 70 - 99 mg/dL   BUN 67 (H) 8 - 23 mg/dL   Creatinine, Ser 2.27 (H) 0.44 - 1.00 mg/dL   Calcium 8.1 (L) 8.9 - 10.3 mg/dL   Total Protein 5.3 (L) 6.5 - 8.1 g/dL   Albumin 2.2 (L) 3.5 - 5.0 g/dL   AST 28 15 - 41 U/L   ALT 21 0 - 44 U/L   Alkaline Phosphatase 94 38 - 126 U/L   Total Bilirubin 0.8 0.3 - 1.2 mg/dL   GFR calc non Af Amer 22 (L) >60 mL/min   GFR calc Af Amer 25 (L) >60 mL/min   Anion gap 11 5 - 15    Comment: Performed at [facility], [address], [place], [place] [address]  Lactic acid, plasma     Status: None   Collection Time: 01/20/19  1:39 PM  Result Value Ref Range   Lactic Acid, Venous 1.6 0.5 - 1.9 mmol/L    Comment: Performed at [facility], [address] [place], [place] [address]  CBC with Differential     Status: Abnormal   Collection Time: 01/20/19  1:39 PM  Result Value Ref Range   WBC 11.5 (H) 4.0 - 10.5 K/uL   RBC 2.98 (L) 3.87 - 5.11 MIL/uL   Hemoglobin 9.6 (L) 12.0 - 15.0 g/dL   HCT 30.6 (L) 36.0 - 46.0 %   MCV 102.7 (H) 80.0 - 100.0 fL   MCH 32.2 26.0 - 34.0 pg   MCHC 31.4 30.0 - 36.0 g/dL   RDW 18.7 (H) 11.5 - 15.5 %   Platelets 34 (L) 150 - 400 K/uL    Comment: PLATELET COUNT CONFIRMED BY SMEAR SPECIMEN CHECKED FOR CLOTS Immature Platelet Fraction may be clinically indicated, consider ordering this additional test [id number]    nRBC 0.0 0.0 - 0.2 %   Neutrophils Relative % 89 %   Neutro Abs 10.2 (H) 1.7 - 7.7 K/uL   Lymphocytes Relative 3 %   Lymphs Abs 0.3 (L) 0.7 - 4.0 K/uL   Monocytes Relative 7 %   Monocytes Absolute 0.8 0.1 - 1.0 K/uL   Eosinophils Relative 0 %   Eosinophils Absolute 0.0 0.0 - 0.5 K/uL   Basophils Relative 0 %  Basophils  Absolute 0.0 0.0 - 0.1 K/uL   Immature Granulocytes 1 %   Abs Immature Granulocytes 0.10 (H) 0.00 - 0.07 K/uL    Comment: Performed at [facility], [address], [place], [place] [address]  Protime-INR     Status: None   Collection Time: 01/20/19  1:39 PM  Result Value Ref Range   Prothrombin Time 14.8 11.4 - 15.2 seconds   INR 1.2 0.8 - 1.2    Comment: (NOTE) INR goal varies based on device and disease states. Performed at [facility], [address] [place], [place] [address]   Culture, blood (Routine x 2)     Status: None (Preliminary result)   Collection Time: 01/20/19  1:39 PM  Result Value Ref Range   Specimen Description BLOOD DRAWN BY RN SITE NOT SPECIFIED    Special Requests      BOTTLES DRAWN AEROBIC AND ANAEROBIC Blood Culture adequate volume Performed at [facility], [address], [place], [place] [address]    Culture PENDING    Report Status PENDING   Lactic acid, plasma     Status: None   Collection Time: 01/20/19  3:42 PM  Result Value Ref Range   Lactic Acid, Venous 1.9 0.5 - 1.9 mmol/L    Comment: Performed at [facility], [address], [place], [place] [address]  Culture, blood (Routine x 2)     Status: None (Preliminary result)   Collection Time: 01/20/19  3:42 PM  Result Value Ref Range   Specimen Description RIGHT ANTECUBITAL    Special Requests      BOTTLES DRAWN AEROBIC AND ANAEROBIC Blood Culture adequate volume Performed at [facility], [address] [place], [place] [address]    Culture PENDING    Report Status PENDING    Dg Chest 2 View  Result Date: 01/20/2019 CLINICAL DATA:  68 year old female with a history of shortness of breath EXAM: CHEST - 2 VIEW COMPARISON:  11/28/2018, 09/30/2017, 08/04/2017 FINDINGS: Cardiomediastinal silhouette unchanged in size and contour. No evidence of central vascular congestion. No interlobular septal thickening. No pneumothorax or pleural effusion. Right IJ port catheter, unchanged. No confluent airspace  disease.  Low lung volumes. IMPRESSION: Negative for acute cardiopulmonary disease. Right IJ port catheter. Electronically Signed   By: Corrie Mckusick D.O.   On: 01/20/2019 14:04    Pending Labs Unresulted Labs (From admission, onward)    Start     Ordered   01/20/19 1900  Potassium  Once,   R     01/20/19 1616   01/20/19 1300  Urinalysis, Routine w reflex microscopic  ONCE - STAT,   STAT     01/20/19 1300   Signed and Held  Basic metabolic panel  Tomorrow morning,   R     Signed and Held   Signed and Held  CBC  Tomorrow morning,   R     Signed and Held          Vitals/Pain Today's Vitals   01/20/19 1500 01/20/19 1517 01/20/19 1530 01/20/19 1600  BP: 116/67  103/66 100/65  Pulse: (!) 106  (!) 110 (!) 116  Resp: (!) 27  (!) 21 (!) 29  Temp:      TempSrc:      SpO2: 95% 100% 100% 100%  Weight:      Height:      PainSc:        Isolation Precautions No active isolations  Medications Medications  sodium chloride flush (NS) 0.9 %  injection 3 mL (has no administration in time range)  0.9 %  sodium chloride infusion (has no administration in time range)  sodium chloride 0.9 % bolus 250 mL (0 mLs Intravenous Stopped 01/20/19 1520)  insulin aspart (novoLOG) injection 5 Units (5 Units Intravenous Given 01/20/19 1511)    And  dextrose 50 % solution 50 mL (50 mLs Intravenous Given 01/20/19 1508)  sodium bicarbonate injection 50 mEq (50 mEq Intravenous Given 01/20/19 1513)  albuterol (PROVENTIL) (2.5 MG/3ML) 0.083% nebulizer solution 10 mg (10 mg Nebulization Given 01/20/19 1517)  sodium zirconium cyclosilicate (LOKELMA) packet 10 g (10 g Oral Given 01/20/19 1644)    Mobility walks Moderate fall risk   Focused Assessments    R Recommendations: See Admitting Provider Note  Report given to:   Additional Notes:

## 2019-01-20 NOTE — Patient Instructions (Addendum)
[place] at [facility] Discharge Instructions  You were seen today by Dr. Delton Coombes. He went over your recent lab results. We will stop the cancer pills. He feels you need to be evaluated in the ER today.    Thank you for choosing [place] at [facility] to provide your oncology and hematology care.  To afford each patient quality time with our provider, please arrive at least 15 minutes before your scheduled appointment time.   If you have a lab appointment with the [facility] please come in thru the  Main Entrance and check in at the main information desk  You need to re-schedule your appointment should you arrive 10 or more minutes late.  We strive to give you quality time with our providers, and arriving late affects you and other patients whose appointments are after yours.  Also, if you no show three or more times for appointments you may be dismissed from the clinic at the providers discretion.     Again, thank you for choosing [facility].  Our hope is that these requests will decrease the amount of time that you wait before being seen by our physicians.       _____________________________________________________________  Should you have questions after your visit to [facility], please contact our office at [phone] between the hours of 8:00 a.m. and 4:30 p.m.  Voicemails left after 4:00 p.m. will not be returned until the following business day.  For prescription refill requests, have your pharmacy contact our office and allow 72 hours.    Cancer Center Support Programs:   > Cancer Support Group  2nd Tuesday of the month 1pm-2pm, Journey Room

## 2019-01-20 NOTE — ED Triage Notes (Signed)
Pt came down from cancer center, husband states that patient's WBC is high, platelets are low, K+ is "very high", and BP is 95/56.

## 2019-01-20 NOTE — H&P (Signed)
History and Physical    Ellen Hunt BWG:[id number] DOB: December 28, 1950 DOA: 01/20/2019  PCP: Eustaquio Maize, MD (Inactive)   Patient coming from: Home  I have personally briefly reviewed patient's old medical records in [facility]  Chief Complaint: Weakness, abnormal labs  HPI: Ellen Hunt is a 68 y.o. female with medical history significant for High-grade carcinoma of the peritoneum, with malignant ascites, DM2, CKD3, HTN, presented to the ED with complaints of generalized weakness over the past month progressive, and abnormal lab from her oncologist office.  Patient went to see her oncologist Dr. Delton Coombes today, blood work showed hyperkalemia, hyponatremia.  Also reports progressive abdominal distention since last paracentesis a week ago, she is due for another paracentesis in another week.  With her abdominal distention she is having increased work of breathing with minimal exertion. She reports minimal p.o. intake. No Vomiting no loose stools.  No fevers at home.  No recent travels no sick contacts.  No chest pain, no swelling of lower extremities,no redness or pain.  Recent hospitalization 1/26- 1/27- for malignant ascites requiring paracentesis- drainage of 9 L of fluid, with acute kidney injury.  ED Course: Tachycardic to 116, blood pressure systolic 17/79, O2 sats greater than 93% on room air.  K6.6.  Sodium 127.  Cr- 2.2, new baseline appears to be ~2.  Lactic acid-  1.6.  WBC 11.5.  EKG showed sinus tachycardia-rates 141, with RBBB, LPFB.  Patient given insulin 5 D50, sodium bicarb and needs to 250 mill normal saline, hospitalist to admit for hyperkalemia, generalized weakness and dehydration.  Review of Systems: As per HPI all other systems reviewed and negative.  Past Medical History:  Diagnosis Date  . Diabetes mellitus without complication (HCC)    on insulin pump  . Dysrhythmia   . 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 Family history of  breast cancer   . GERD (gastroesophageal reflux disease)   . History of blood transfusion   . History of bronchitis   . History of chemotherapy   . History of urinary tract infection   . Hyperlipidemia   . Hypertension   . Low serum vitamin D   . Ovarian cancer ([place]) 05/09/2016  . Shingles     Past Surgical History:  Procedure Laterality Date  . ABDOMINAL HYSTERECTOMY  10/14/2016  . CESAREAN SECTION    . DEBULKING N/A 10/14/2016   Procedure: DEBULKING;  Surgeon: Everitt Amber, MD;  [facility];  Service: Gynecology;  Laterality: N/A;  . IR PARACENTESIS  06/01/2017  . LAPAROTOMY WITH STAGING N/A 10/14/2016   Procedure: LAPAROTOMY WITH OMENTECTOMY AND TUMOR DEBULGING;  Surgeon: Everitt Amber, MD;  [facility];  Service: Gynecology;  Laterality: N/A;  . LUMBAR FUSION  08/21/15   L3-L4 Dr. Timmothy Euler  . OMENTECTOMY N/A 10/14/2016   Procedure: OMENTECTOMY;  Surgeon: Everitt Amber, MD;  [facility];  Service: Gynecology;  Laterality: N/A;  . ROBOTIC ASSISTED TOTAL HYSTERECTOMY WITH BILATERAL SALPINGO OOPHERECTOMY Bilateral 10/14/2016   Procedure: XI ROBOTIC ASSISTED TOTAL LAPARSCOPIC  HYSTERECTOMY WITH BILATERAL SALPINGO OOPHORECTOMY;  Surgeon: Everitt Amber, MD;  [facility];  Service: Gynecology;  Laterality: Bilateral;     reports that she has never smoked. She has never used smokeless tobacco. She reports that she does not drink alcohol or use drugs.  Allergies  Allergen Reactions  . Avandia [Rosiglitazone] Other (See Comments)    Legs swelled  . Micronase [Glyburide] Swelling  . Actos [Pioglitazone] Other (See Comments)  Edema / leg swelling    Family History  Problem Relation Age of Onset  . Lung cancer Mother        smoker; dx in her 19s  . Leukemia Father   . Diabetes Paternal Grandmother   . Heart attack Paternal Grandmother   . Diabetes Paternal Grandfather   . Breast cancer Paternal Aunt        dx in her 46s-30s  . Leukemia Paternal Uncle   . Heart  attack Maternal Grandfather   . Breast cancer Cousin        maternal first cousin    Prior to Admission medications   Medication Sig Start Date End Date Taking? Authorizing Provider  atorvastatin (LIPITOR) 40 MG tablet Take 1 tablet (40 mg total) by mouth daily. 10/07/18   Eustaquio Maize, MD  Biotin 1000 MCG tablet Take 1,000 mcg by mouth daily.     [provider]  furosemide (LASIX) 20 MG tablet TAKE 1 TABLET(20 MG) BY MOUTH TWICE DAILY 01/03/19   Lockamy, Randi L, NP-C  glucose blood (ONE TOUCH ULTRA TEST) test strip Use to test blood sugar 6-8 times daily 11/01/18   Philemon Kingdom, MD  HYDROcodone-acetaminophen (NORCO) 10-325 MG tablet Take 1 tablet by mouth every 4 (four) hours as needed. 01/05/19   Lockamy, Randi L, NP-C  insulin lispro (HUMALOG) 100 UNIT/ML injection USE IN INSULIN PUMP AS DIRECTED. MAX DAILY DOSE OF 110 UNITS PER DAY. Patient taking differently: Inject 110 Units into the skin See admin instructions. USE IN INSULIN PUMP AS DIRECTED. MAX DAILY DOSE OF 110 UNITS PER DAY. 09/27/18   Philemon Kingdom, MD  lidocaine-prilocaine (EMLA) cream Apply a quarter size amount to port site 1 hour prior to chemo. Do not rub in. Cover with plastic wrap. 12/09/17   Heath Lark, MD  magnesium oxide (MAG-OX) 400 MG tablet Take 400 mg by mouth 2 (two) times daily. Pt taking once a day    [provider]  Multiple Vitamin (MULTIVITAMIN WITH MINERALS) TABS Take 1 tablet by mouth daily.    [provider]  omeprazole (PRILOSEC) 40 MG capsule Take 1 capsule (40 mg total) by mouth daily. 09/15/18   Derek Jack, MD  polyethylene glycol (MIRALAX / Floria Raveling) packet Take 17 g by mouth daily as needed for mild constipation.     [provider]  prochlorperazine (COMPAZINE) 10 MG tablet Take 1 tablet (10 mg total) by mouth every 6 (six) hours as needed for nausea or vomiting. 07/16/18   Heath Lark, MD  RUBRACA 300 MG tablet Take 1 tablet (300 mg total) by mouth  daily. 01/05/19   Derek Jack, MD  vitamin B-12 (CYANOCOBALAMIN) 1000 MCG tablet Take 1,000 mcg by mouth daily.    [provider]  vitamin E (VITAMIN E) 400 UNIT capsule Take 400 Units by mouth daily.    [provider]    Physical Exam: Vitals:   01/20/19 1400 01/20/19 1430 01/20/19 1500 01/20/19 1517  BP: 100/76 (!) 98/58 116/67   Pulse: 77 (!) 102 (!) 106   Resp: (!) 24 (!) 28 (!) 27   Temp:      TempSrc:      SpO2: 96% 93% 95% 100%  Weight:      Height:        Constitutional: Chronically ill-appearing, calm, comfortable Vitals:   01/20/19 1400 01/20/19 1430 01/20/19 1500 01/20/19 1517  BP: 100/76 (!) 98/58 116/67   Pulse: 77 (!) 102 (!) 106   Resp: Marland Kitchen)  24 (!) 28 (!) 27   Temp:      TempSrc:      SpO2: 96% 93% 95% 100%  Weight:      Height:       Eyes: PERRL, lids and conjunctivae normal ENMT: Mucous membranes are dry Posterior pharynx clear of any exudate or lesions.  Neck: normal, supple, no masses, no thyromegaly Respiratory: clear to auscultation bilaterally, no wheezing, no crackles. Normal respiratory effort. No accessory muscle use.  Cardiovascular: Tachycardic, but Regular rate and rhythm, no murmurs / rubs / gallops. No extremity edema. 2+ pedal pulses. No carotid bruits.  Abdomen:  distended nontender, no masses palpated. No hepatosplenomegaly. Bowel sounds positive.  Musculoskeletal: no clubbing / cyanosis. No joint deformity upper and lower extremities. Good ROM, no contractures. Normal muscle tone.  Skin: no rashes, lesions, ulcers. No induration Neurologic: CN 2-12 grossly intact.Strength 5/5 in all 4.  Psychiatric: Normal judgment and insight. Alert and oriented x 3. Normal mood.   Labs on Admission: I have personally reviewed following labs and imaging studies  CBC: Recent Labs  Lab 01/20/19 1017 01/20/19 1339  WBC 11.4* 11.5*  NEUTROABS 9.9* 10.2*  HGB 9.9* 9.6*  HCT 31.8* 30.6*  MCV 105.3* 102.7*  PLT 33* 34*    Basic Metabolic Panel: Recent Labs  Lab 01/20/19 1017 01/20/19 1339  NA 127* 126*  K 6.6* 6.6*  CL 97* 96*  CO2 19* 19*  GLUCOSE 186* 197*  BUN 64* 67*  CREATININE 2.17* 2.27*  CALCIUM 8.3* 8.1*  MG 2.6*  --    Liver Function Tests: Recent Labs  Lab 01/20/19 1017 01/20/19 1339  AST 34 28  ALT 21 21  ALKPHOS 97 94  BILITOT 0.7 0.8  PROT 5.3* 5.3*  ALBUMIN 2.2* 2.2*   Coagulation Profile: Recent Labs  Lab 01/20/19 1339  INR 1.2    Radiological Exams on Admission: Dg Chest 2 View  Result Date: 01/20/2019 CLINICAL DATA:  68 year old female with a history of shortness of breath EXAM: CHEST - 2 VIEW COMPARISON:  11/28/2018, 09/30/2017, 08/04/2017 FINDINGS: Cardiomediastinal silhouette unchanged in size and contour. No evidence of central vascular congestion. No interlobular septal thickening. No pneumothorax or pleural effusion. Right IJ port catheter, unchanged. No confluent airspace disease.  Low lung volumes. IMPRESSION: Negative for acute cardiopulmonary disease. Right IJ port catheter. Electronically Signed   By: Corrie Mckusick D.O.   On: 01/20/2019 14:04    EKG: Independently reviewed.  Sinus tachycardia rate 141.  QTc 449.  RBBB, LPFP.   Assessment/Plan Active Problems:   Hyperkalemia   Generalized weakness -poor functional status likely from malignancy and chemotherapy, with hypotension likely from poor p.o. intake. Per Dr. Tomie China notes today discontinue all forms of chemotherapy, and best supportive care in the form of hospice in the next few days. - PT evaluation,  - case manager consult patient may benefit from home health. - N/s 75cc/hr x 15hrs  Hyperkalemia- 6.6, likely from renal insufficiency, potassium supplement discontinued on recent hospitalization.  Albuterol nebs, sodium bicarbonate insulin D50 given in ED. - Give lokelma 10mg   X1, and repeat in a.m. 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  Follows with Dr. Delton Coombes. Per Notes today chemo will be discontinued.  Plans for hospice care. Has required intermittent paracentesis.  Last paracentesis 3/12//20. -Ultrasound-guided paracentesis in a.m -Case manager consult. -Will also consult palliative care  Thrombocytopenia- 33.  Gradual decline, recent baseline 81 to 95. Likely from  chemotherapy and malignancy. - CBC a.m -Avoid pharmacologic DVT prophylaxis  DM 2-patient on home insulin pump.  - Cont home insulin pump - CBGs AC, HS.   DVT prophylaxis: Scds Code Status: Full Family Communication: Spouse at bedside Disposition Plan: 1-2 days Consults called: None Admission status: Obs tele   Bethena Roys MD [facility]  01/20/2019, 4:11 PM

## 2019-01-20 NOTE — Assessment & Plan Note (Signed)
1.  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 -Underwent 7 cycles of chemotherapy with carboplatin and paclitaxel from 06/01/2016 through 09/18/2016. -Germline mutation testing on 06/25/2016 was negative. - Underwent robotic assisted tumor debulking surgery on 10/14/2016 by Dr. Denman George. -3 cycles of chemotherapy with carboplatin and paclitaxel from 11/06/2016 through 12/25/2016. - Second line therapy with Doxil and bevacizumab from 05/25/2017 through 07/07/2017. - Third line therapy with gemcitabine from 07/30/2017 through December 2018, gemcitabine and cisplatin day 1 and day 8 q. 21 days from 11/02/2017, 7 cycles completed on 06/14/2018, with CT scan on 07/01/2018 showing progression. - 3 cycles of docetaxel 75 mg/m from 07/16/2018 through 08/26/2018. -CT CAP on 09/13/2018 showed progression of disease.  Malignant ascites present. 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 - Rucaparib 600 mg twice daily started on 09/25/2018. - 7 days after starting rucaparib, she started feeling very weak and nauseous.  Rucaparib was held on 10/05/2018 due to severe weakness.  -She was admitted to the hospital on 10/08/2018 through 10/11/2018 with weakness, and renal failure. -CT abdomen on 10/09/2018 showed stable omental and peritoneal disease. - I have decreased rucaparib to 300 mg twice a day on 10/15/2018. -Rucaparib was further dose reduced to 300 mg once daily on 11/08/2018 secondary to elevated creatinine of 2.0. -She is tolerating current dose of rucaparib 300 mg daily very well. -Ca1 25 on 09/06/2018 was 2798.  This is down to 1571 on 11/25/2018. -She was hospitalized on 11/28/2018 through 11/29/2018 with some shortness of breath and had paracentesis done. - CT abdomen and pelvis on 12/10/2018 shows extensive caking of the tumor along the omentum.  Peritoneal implant along the transverse  colon measures 5.3 x 3.6 cm, formerly 4.2 x 3.2 cm.  Caking of the tumor along the gastrohepatic ligament and below the stomach is mildly more prominent, with with a band of tumor below the stomach measuring 2 cm in thickness, formerly 1.7 cm.  No new areas were seen. -Because of her poor performance status, we have decided to continue same treatment for couple of more months to repeat scans. - Husband reported that she has become progressively weaker. -We reviewed her blood work.  She had many electrolyte abnormalities including hyponatremia, hyperkalemia and slight worsening of her baseline elevated creatinine. - She does require hospitalization as she is very weak.  I have called and talked to Dr. Roderic Palau in the ER to facilitate admission. - I have discontinued rucaparib.  I will talk to her about discontinuing all forms of therapy and best supportive care in the form of hospice in the next few days.  2.  Malignant ascites: -Paracentesis on 07/08/2018, 4 L removed. -Paracentesis on 09/21/2018, 7 L removed. -Paracentesis on 10/11/2018 with 4 L removed. -Paracentesis on 11/11/2018 with 5 L of yellow fluid removed. -Paracentesis on 11/29/2018, with 9 L fluid removed. -Paracentesis on 12/08/2018 with 5 L removed. -Paracentesis on 12/17/2018 with 6 L fluid removed. -Paracentesis on 12/31/2018 with 8.3 L removed. -Paracentesis on 01/13/2019. -She was taking Lasix 20 mg every other day.

## 2019-01-21 ENCOUNTER — Encounter (HOSPITAL_COMMUNITY): Payer: Self-pay | Admitting: Radiology

## 2019-01-21 ENCOUNTER — Inpatient Hospital Stay (HOSPITAL_COMMUNITY): Payer: Medicare Other

## 2019-01-21 ENCOUNTER — Observation Stay (HOSPITAL_COMMUNITY): Payer: Medicare Other

## 2019-01-21 DIAGNOSIS — Z7189 Other specified counseling: Secondary | ICD-10-CM | POA: Diagnosis not present

## 2019-01-21 DIAGNOSIS — E785 Hyperlipidemia, unspecified: Secondary | ICD-10-CM | POA: Diagnosis present

## 2019-01-21 DIAGNOSIS — M8580 Other specified disorders of bone density and structure, unspecified site: Secondary | ICD-10-CM | POA: Diagnosis present

## 2019-01-21 DIAGNOSIS — C482 Malignant neoplasm of peritoneum, unspecified: Secondary | ICD-10-CM | POA: Diagnosis present

## 2019-01-21 DIAGNOSIS — D631 Anemia in chronic kidney disease: Secondary | ICD-10-CM | POA: Diagnosis present

## 2019-01-21 DIAGNOSIS — R180 Malignant ascites: Secondary | ICD-10-CM

## 2019-01-21 DIAGNOSIS — E860 Dehydration: Secondary | ICD-10-CM | POA: Diagnosis present

## 2019-01-21 DIAGNOSIS — I959 Hypotension, unspecified: Secondary | ICD-10-CM | POA: Diagnosis present

## 2019-01-21 DIAGNOSIS — N179 Acute kidney failure, unspecified: Secondary | ICD-10-CM | POA: Diagnosis present

## 2019-01-21 DIAGNOSIS — Z66 Do not resuscitate: Secondary | ICD-10-CM | POA: Diagnosis not present

## 2019-01-21 DIAGNOSIS — E538 Deficiency of other specified B group vitamins: Secondary | ICD-10-CM | POA: Diagnosis present

## 2019-01-21 DIAGNOSIS — D6481 Anemia due to antineoplastic chemotherapy: Secondary | ICD-10-CM | POA: Diagnosis present

## 2019-01-21 DIAGNOSIS — T451X5A Adverse effect of antineoplastic and immunosuppressive drugs, initial encounter: Secondary | ICD-10-CM | POA: Diagnosis present

## 2019-01-21 DIAGNOSIS — C569 Malignant neoplasm of unspecified ovary: Secondary | ICD-10-CM | POA: Diagnosis present

## 2019-01-21 DIAGNOSIS — E875 Hyperkalemia: Secondary | ICD-10-CM | POA: Diagnosis present

## 2019-01-21 DIAGNOSIS — E871 Hypo-osmolality and hyponatremia: Secondary | ICD-10-CM | POA: Diagnosis present

## 2019-01-21 DIAGNOSIS — N183 Chronic kidney disease, stage 3 (moderate): Secondary | ICD-10-CM

## 2019-01-21 DIAGNOSIS — I129 Hypertensive chronic kidney disease with stage 1 through stage 4 chronic kidney disease, or unspecified chronic kidney disease: Secondary | ICD-10-CM | POA: Diagnosis present

## 2019-01-21 DIAGNOSIS — D6959 Other secondary thrombocytopenia: Secondary | ICD-10-CM | POA: Diagnosis present

## 2019-01-21 DIAGNOSIS — G620 Drug-induced polyneuropathy: Secondary | ICD-10-CM | POA: Diagnosis present

## 2019-01-21 DIAGNOSIS — Z515 Encounter for palliative care: Secondary | ICD-10-CM | POA: Diagnosis not present

## 2019-01-21 DIAGNOSIS — E1122 Type 2 diabetes mellitus with diabetic chronic kidney disease: Secondary | ICD-10-CM | POA: Diagnosis present

## 2019-01-21 DIAGNOSIS — K219 Gastro-esophageal reflux disease without esophagitis: Secondary | ICD-10-CM | POA: Diagnosis present

## 2019-01-21 DIAGNOSIS — I452 Bifascicular block: Secondary | ICD-10-CM | POA: Diagnosis present

## 2019-01-21 DIAGNOSIS — C799 Secondary malignant neoplasm of unspecified site: Secondary | ICD-10-CM | POA: Diagnosis present

## 2019-01-21 LAB — BASIC METABOLIC PANEL
Anion gap: 11 (ref 5–15)
BUN: 68 mg/dL — ABNORMAL HIGH (ref 8–23)
CO2: 18 mmol/L — ABNORMAL LOW (ref 22–32)
Calcium: 7.9 mg/dL — ABNORMAL LOW (ref 8.9–10.3)
Chloride: 97 mmol/L — ABNORMAL LOW (ref 98–111)
Creatinine, Ser: 2.39 mg/dL — ABNORMAL HIGH (ref 0.44–1.00)
GFR calc Af Amer: 24 mL/min — ABNORMAL LOW (ref >60)
GFR calc non Af Amer: 21 mL/min — ABNORMAL LOW (ref >60)
Glucose, Bld: 193 mg/dL — ABNORMAL HIGH (ref 70–99)
Potassium: 6.1 mmol/L — ABNORMAL HIGH (ref 3.5–5.1)
SODIUM: 126 mmol/L — AB (ref 135–145)

## 2019-01-21 LAB — BLOOD GAS, ARTERIAL
Acid-base deficit: 2.5 mmol/L — ABNORMAL HIGH (ref 0.0–2.0)
Bicarbonate: 22.8 mmol/L (ref 20.0–28.0)
FIO2: 52
O2 Saturation: 90.1 %
Patient temperature: 37
pCO2 arterial: 28.5 mmHg — ABNORMAL LOW (ref 32.0–48.0)
pH, Arterial: 7.472 — ABNORMAL HIGH (ref 7.350–7.450)
pO2, Arterial: 63.3 mmHg — ABNORMAL LOW (ref 83.0–108.0)

## 2019-01-21 LAB — GLUCOSE, CAPILLARY
Glucose-Capillary: 127 mg/dL — ABNORMAL HIGH (ref 70–99)
Glucose-Capillary: 159 mg/dL — ABNORMAL HIGH (ref 70–99)
Glucose-Capillary: 173 mg/dL — ABNORMAL HIGH (ref 70–99)
Glucose-Capillary: 196 mg/dL — ABNORMAL HIGH (ref 70–99)
Glucose-Capillary: 217 mg/dL — ABNORMAL HIGH (ref 70–99)
Glucose-Capillary: 86 mg/dL (ref 70–99)

## 2019-01-21 LAB — CBC
HCT: 27.6 % — ABNORMAL LOW (ref 36.0–46.0)
Hemoglobin: 8.7 g/dL — ABNORMAL LOW (ref 12.0–15.0)
MCH: 32.5 pg (ref 26.0–34.0)
MCHC: 31.5 g/dL (ref 30.0–36.0)
MCV: 103 fL — ABNORMAL HIGH (ref 80.0–100.0)
Platelets: 29 10*3/uL — CL (ref 150–400)
RBC: 2.68 MIL/uL — AB (ref 3.87–5.11)
RDW: 18.6 % — ABNORMAL HIGH (ref 11.5–15.5)
WBC: 9.7 10*3/uL (ref 4.0–10.5)
nRBC: 0 % (ref 0.0–0.2)

## 2019-01-21 MED ORDER — HYDROCODONE-ACETAMINOPHEN 10-325 MG PO TABS
1.0000 | ORAL_TABLET | Freq: Once | ORAL | Status: AC
  Administered 2019-01-21: 1 via ORAL
  Filled 2019-01-21: qty 1

## 2019-01-21 MED ORDER — SODIUM ZIRCONIUM CYCLOSILICATE 5 G PO PACK
5.0000 g | PACK | Freq: Every day | ORAL | Status: DC
  Administered 2019-01-21 – 2019-01-24 (×4): 5 g via ORAL
  Filled 2019-01-21 (×4): qty 1

## 2019-01-21 NOTE — TOC Initial Note (Signed)
Transition of Care [facility]) - Initial/Assessment Note    Patient Details  Name: Ellen Hunt MRN: [id number] Date of Birth: 26-Jan-1951  Transition of Care [facility]) CM/SW Contact:    Sherald Barge, RN Phone Number: 01/21/2019, 10:41 AM  Clinical Narrative:             Expected Discharge Plan: [place]     Patient Goals and CMS Choice Patient states their goals for this hospitalization and ongoing recovery are:: get back home CMS Medicare.gov Compare Post Acute Care list provided to:: Patient Choice offered to / list presented to : Patient  Expected Discharge Plan and Services Expected Discharge Plan: [place] In-house Referral: Hospice / [place] Acute Care Choice: [place] Living arrangements for the past 2 months: Single Family Home                          Prior Living Arrangements/Services Living arrangements for the past 2 months: Single Family Home Lives with:: Spouse Patient language and need for interpreter reviewed:: Yes Do you feel safe going back to the place where you live?: Yes      Need for Family Participation in Patient Care: Yes (Comment) Care giver support system in place?: Yes (comment)   Criminal Activity/Legal Involvement Pertinent to Current Situation/Hospitalization: No - Comment as needed  Activities of Daily Living Home Assistive Devices/Equipment: Wheelchair, Shower chair with back, Bedside commode/3-in-1 ADL Screening (condition at time of admission) Patient's cognitive ability adequate to safely complete daily activities?: Yes Is the patient deaf or have difficulty hearing?: No Does the patient have difficulty seeing, even when wearing glasses/contacts?: No Does the patient have difficulty concentrating, remembering, or making decisions?: No Patient able to express need for assistance with ADLs?: Yes Does the patient have difficulty dressing or bathing?:  Yes Independently performs ADLs?: No Communication: Independent Dressing (OT): Needs assistance Is this a change from baseline?: Pre-admission baseline Grooming: Needs assistance Is this a change from baseline?: Pre-admission baseline Feeding: Independent Bathing: Needs assistance Is this a change from baseline?: Pre-admission baseline Toileting: Needs assistance Is this a change from baseline?: Pre-admission baseline In/Out Bed: Needs assistance Is this a change from baseline?: Pre-admission baseline Walks in Home: Needs assistance Is this a change from baseline?: Pre-admission baseline Does the patient have difficulty walking or climbing stairs?: Yes Weakness of Legs: Both Weakness of Arms/Hands: Both  Permission Sought/Granted Permission sought to share information with : Chartered certified accountant granted to share information with : Yes, Counsellor granted to share info w AGENCY: [place], ([place], [facility] and [facility] if turned down by [employer])        Emotional Assessment Appearance:: Appears older than stated age Attitude/Demeanor/Rapport: Guarded Affect (typically observed): Depressed, Quiet, Calm, Flat Orientation: : Oriented to Self, Oriented to Place, Oriented to  Time, Oriented to Situation Alcohol / Substance Use: Not Applicable    Admission diagnosis:  Hyperkalemia [E87.5] Peritoneal carcinoma ([place]) [C48.2] Patient Active Problem List   Diagnosis Date Noted  . Malignant neoplasm of ovary ([place])   . Diabetes mellitus due to underlying condition with diabetic nephropathy, with long-term current use of insulin ([place])   . Ascites, malignant 11/28/2018  . Hyperkalemia 10/08/2018  . Severe dehydration 10/08/2018  . Hypermagnesemia 10/08/2018  . Hyperglycemia 10/08/2018  . Insulin pump in place 10/08/2018  . Acute renal failure superimposed on stage 3  chronic kidney disease ([place])  10/08/2018  . Malignant ascites 07/02/2018  . Drug-induced hypotension 06/11/2018  . CKD (chronic kidney disease), stage III ([place]) 03/08/2018  . Hypomagnesemia 02/08/2018  . Peripheral neuropathy due to chemotherapy ([place]) 11/10/2017  . Elevated liver enzymes 08/25/2017  . Pancytopenia, acquired ([place]) 08/14/2017  . E-coli UTI 08/05/2017  . Thrombocytopenia ([place]) 08/04/2017  . Sepsis secondary to UTI ([place]) 08/04/2017  . AKI (acute kidney injury) ([place]) 08/04/2017  . Goals of care, counseling/discussion 05/14/2017  . Edema leg 12/18/2016  . Influenza with pneumonia 11/20/2016  . Sepsis ([place]) 11/20/2016  . Antineoplastic chemotherapy induced pancytopenia ([place]) 11/20/2016  . HCAP (healthcare-associated pneumonia) 11/19/2016  . Infection of urinary tract 10/14/2016  . B12 deficiency 08/31/2016  . Anemia due to antineoplastic chemotherapy 08/09/2016  . Chemotherapy-induced neuropathy ([place]) 08/07/2016  . Glaucoma 08/07/2016  . Genetic testing 06/27/2016  . Family history of breast cancer   . Shingles 05/09/2016  . 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 Nausea with vomiting 04/30/2016  . Sciatica neuralgia 02/12/2015  . Severe obesity (BMI >= 40) ([place]) 10/12/2014  . Hypertension 09/04/2014  . Obesity 09/04/2014  . Type 2 diabetes mellitus with complication, with long term current use of insulin pump ([place]) 01/25/2014  . Vitamin D insufficiency 01/25/2014  . Hyperlipidemia 01/25/2014  . Osteopenia 01/25/2014   PCP:  Eustaquio Maize, MD (Inactive) Pharmacy:   [facility], [place] - [address] AT [place] [place] [address] Phone: [phone] Fax: [phone]  [place], [place] - [address] [address] Phone: [phone] Fax: [phone]     Social Determinants of Health (SDOH) Interventions     Readmission Risk Interventions No flowsheet data found.

## 2019-01-21 NOTE — Progress Notes (Signed)
Patient O2 sats at 72% on RA heart rate @125  midlevel Dr Silas [place] paged and informed of above finding. New orders initiated

## 2019-01-21 NOTE — Consult Note (Signed)
[facility] Consultation Oncology  Name: Ellen Hunt      MRN: [id number]    Location: Q034/V425-95  Date: 01/21/2019 Time:4:16 PM   REFERRING PHYSICIAN: Dr. Dyann Kief  REASON FOR CONSULT: Ovarian cancer.   DIAGNOSIS: Severe weakness, hyperkalemia and renal failure.  HISTORY OF PRESENT ILLNESS: She is a 68 year old very pleasant white female who is seen in consultation today for ovarian cancer and its complications.  She is a known patient of mine who was diagnosed with ovarian cancer in June 2017, treated with preoperative chemotherapy followed by debulking surgery on 10/14/2016.  She had multiple recurrences after that and her latest treatment of rucaparib was started in November 2019.  She came to our office yesterday with complaints of severe weakness spanning over the last few days.  She was also found to be having hyperkalemia with elevated creatinine.  She was also very weak and could not stand up or walk.  She was evaluated in the ER and was admitted to the hospital.  After gentle hydration, her potassium improved to 6.1.  Creatinine is 2.39.  Patient reports that she feels slightly better.  PAST MEDICAL HISTORY:   Past Medical History:  Diagnosis Date  . Diabetes mellitus without complication (HCC)    on insulin pump  . Dysrhythmia   . 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 Family history of breast cancer   . GERD (gastroesophageal reflux disease)   . History of blood transfusion   . History of bronchitis   . History of chemotherapy   . History of urinary tract infection   . Hyperlipidemia   . Hypertension   . Low serum vitamin D   . Ovarian cancer ([place]) 05/09/2016  . Shingles     ALLERGIES: Allergies  Allergen Reactions  . Avandia [Rosiglitazone] Other (See Comments)    Legs swelled  . Micronase [Glyburide] Swelling  . Actos [Pioglitazone] Other (See Comments)    Edema / leg swelling      MEDICATIONS: I have reviewed the patient's current  medications.     PAST SURGICAL HISTORY Past Surgical History:  Procedure Laterality Date  . ABDOMINAL HYSTERECTOMY  10/14/2016  . CESAREAN SECTION    . DEBULKING N/A 10/14/2016   Procedure: DEBULKING;  Surgeon: Everitt Amber, MD;  Location: [facility];  Service: Gynecology;  Laterality: N/A;  . IR PARACENTESIS  06/01/2017  . LAPAROTOMY WITH STAGING N/A 10/14/2016   Procedure: LAPAROTOMY WITH OMENTECTOMY AND TUMOR DEBULGING;  Surgeon: Everitt Amber, MD;  Location: [facility];  Service: Gynecology;  Laterality: N/A;  . LUMBAR FUSION  08/21/15   L3-L4 Dr. Timmothy Euler  . OMENTECTOMY N/A 10/14/2016   Procedure: OMENTECTOMY;  Surgeon: Everitt Amber, MD;  Location: [facility];  Service: Gynecology;  Laterality: N/A;  . ROBOTIC ASSISTED TOTAL HYSTERECTOMY WITH BILATERAL SALPINGO OOPHERECTOMY Bilateral 10/14/2016   Procedure: XI ROBOTIC ASSISTED TOTAL LAPARSCOPIC  HYSTERECTOMY WITH BILATERAL SALPINGO OOPHORECTOMY;  Surgeon: Everitt Amber, MD;  Location: [facility];  Service: Gynecology;  Laterality: Bilateral;    FAMILY HISTORY: Family History  Problem Relation Age of Onset  . Lung cancer Mother        smoker; dx in her 65s  . Leukemia Father   . Diabetes Paternal Grandmother   . Heart attack Paternal Grandmother   . Diabetes Paternal Grandfather   . Breast cancer Paternal Aunt        dx in her 76s-30s  . Leukemia Paternal Uncle   . Heart attack Maternal Grandfather   .  Breast cancer Cousin        maternal first cousin    SOCIAL HISTORY:  reports that she has never smoked. She has never used smokeless tobacco. She reports that she does not drink alcohol or use drugs.  PERFORMANCE STATUS: The patient's performance status is 3 - Symptomatic, >50% confined to bed  PHYSICAL EXAM: Most Recent Vital Signs: Blood pressure (!) 109/92, pulse (!) 116, temperature 98.2 F (36.8 C), resp. rate 19, height 5\' 2"  (1.575 m), weight 186 lb 15.2 oz (84.8 kg), SpO2 96 %. BP (!) 109/92 (BP Location: Right Arm)   Pulse (!) 116    Temp 98.2 F (36.8 C)   Resp 19   Ht 5\' 2"  (1.575 m)   Wt 186 lb 15.2 oz (84.8 kg)   SpO2 96%   BMI 34.19 kg/m  General appearance: alert and cooperative Lungs: clear to auscultation bilaterally Heart: regular rate and rhythm Abdomen: Slightly tense, distended, nontender with no palpable masses. Extremities: edema 1+. Skin: Skin color, texture, turgor normal. No rashes or lesions Lymph nodes: Cervical, supraclavicular, and axillary nodes normal. Neurologic: Grossly normal  LABORATORY DATA:  Results for orders placed or performed during the hospital encounter of 01/20/19 (from the past 48 hour(s))  Comprehensive metabolic panel     Status: Abnormal   Collection Time: 01/20/19  1:39 PM  Result Value Ref Range   Sodium 126 (L) 135 - 145 mmol/L   Potassium 6.6 (HH) 3.5 - 5.1 mmol/L    Comment: CRITICAL RESULT CALLED TO, READ BACK BY AND VERIFIED WITH: CARWELL,L AT 1422 ON 3.19.20 BY ISLEY,B    Chloride 96 (L) 98 - 111 mmol/L   CO2 19 (L) 22 - 32 mmol/L   Glucose, Bld 197 (H) 70 - 99 mg/dL   BUN 67 (H) 8 - 23 mg/dL   Creatinine, Ser 2.27 (H) 0.44 - 1.00 mg/dL   Calcium 8.1 (L) 8.9 - 10.3 mg/dL   Total Protein 5.3 (L) 6.5 - 8.1 g/dL   Albumin 2.2 (L) 3.5 - 5.0 g/dL   AST 28 15 - 41 U/L   ALT 21 0 - 44 U/L   Alkaline Phosphatase 94 38 - 126 U/L   Total Bilirubin 0.8 0.3 - 1.2 mg/dL   GFR calc non Af Amer 22 (L) >60 mL/min   GFR calc Af Amer 25 (L) >60 mL/min   Anion gap 11 5 - 15    Comment: Performed at [facility], [address], [place], [place] [address]  Lactic acid, plasma     Status: None   Collection Time: 01/20/19  1:39 PM  Result Value Ref Range   Lactic Acid, Venous 1.6 0.5 - 1.9 mmol/L    Comment: Performed at [facility], [address], [place], [place] [address]  CBC with Differential     Status: Abnormal   Collection Time: 01/20/19  1:39 PM  Result Value Ref Range   WBC 11.5 (H) 4.0 - 10.5 K/uL   RBC 2.98 (L) 3.87 - 5.11 MIL/uL   Hemoglobin 9.6 (L)  12.0 - 15.0 g/dL   HCT 30.6 (L) 36.0 - 46.0 %   MCV 102.7 (H) 80.0 - 100.0 fL   MCH 32.2 26.0 - 34.0 pg   MCHC 31.4 30.0 - 36.0 g/dL   RDW 18.7 (H) 11.5 - 15.5 %   Platelets 34 (L) 150 - 400 K/uL    Comment: PLATELET COUNT CONFIRMED BY SMEAR SPECIMEN CHECKED FOR CLOTS Immature Platelet Fraction may be clinically indicated,  consider ordering this additional test [id number]    nRBC 0.0 0.0 - 0.2 %   Neutrophils Relative % 89 %   Neutro Abs 10.2 (H) 1.7 - 7.7 K/uL   Lymphocytes Relative 3 %   Lymphs Abs 0.3 (L) 0.7 - 4.0 K/uL   Monocytes Relative 7 %   Monocytes Absolute 0.8 0.1 - 1.0 K/uL   Eosinophils Relative 0 %   Eosinophils Absolute 0.0 0.0 - 0.5 K/uL   Basophils Relative 0 %   Basophils Absolute 0.0 0.0 - 0.1 K/uL   Immature Granulocytes 1 %   Abs Immature Granulocytes 0.10 (H) 0.00 - 0.07 K/uL    Comment: Performed at [facility], [address], [place], [place] [address]  Protime-INR     Status: None   Collection Time: 01/20/19  1:39 PM  Result Value Ref Range   Prothrombin Time 14.8 11.4 - 15.2 seconds   INR 1.2 0.8 - 1.2    Comment: (NOTE) INR goal varies based on device and disease states. Performed at [facility], [address] [place], [place] [address]   Culture, blood (Routine x 2)     Status: None (Preliminary result)   Collection Time: 01/20/19  1:39 PM  Result Value Ref Range   Specimen Description BLOOD DRAWN BY RN SITE NOT SPECIFIED    Special Requests      BOTTLES DRAWN AEROBIC AND ANAEROBIC Blood Culture adequate volume   Culture      NO GROWTH < 24 HOURS Performed at [facility], [address] [place], [place] [address]    Report Status PENDING   Lactic acid, plasma     Status: None   Collection Time: 01/20/19  3:42 PM  Result Value Ref Range   Lactic Acid, Venous 1.9 0.5 - 1.9 mmol/L    Comment: Performed at [facility], [address], [place], [place] [address]  Culture, blood (Routine x 2)     Status: None (Preliminary result)    Collection Time: 01/20/19  3:42 PM  Result Value Ref Range   Specimen Description RIGHT ANTECUBITAL    Special Requests      BOTTLES DRAWN AEROBIC AND ANAEROBIC Blood Culture adequate volume   Culture      NO GROWTH < 24 HOURS Performed at [facility], [address], [place], [place] [address]    Report Status PENDING   Glucose, capillary     Status: Abnormal   Collection Time: 01/20/19  6:14 PM  Result Value Ref Range   Glucose-Capillary 257 (H) 70 - 99 mg/dL  Glucose, capillary     Status: Abnormal   Collection Time: 01/20/19 10:12 PM  Result Value Ref Range   Glucose-Capillary 178 (H) 70 - 99 mg/dL   Comment 1 Notify RN    Comment 2 Document in Chart   Potassium     Status: Abnormal   Collection Time: 01/20/19 10:44 PM  Result Value Ref Range   Potassium 6.2 (H) 3.5 - 5.1 mmol/L    Comment: Performed at [facility], [address], [place],  [address]  Glucose, capillary     Status: Abnormal   Collection Time: 01/21/19 12:12 AM  Result Value Ref Range   Glucose-Capillary 173 (H) 70 - 99 mg/dL   Comment 1 Notify RN    Comment 2 Document in Chart   Glucose, capillary     Status: Abnormal   Collection Time: 01/21/19  4:02 AM  Result Value Ref Range   Glucose-Capillary 196 (H)  70 - 99 mg/dL   Comment 1 Notify RN    Comment 2 Document in Chart   Basic metabolic panel     Status: Abnormal   Collection Time: 01/21/19  5:54 AM  Result Value Ref Range   Sodium 126 (L) 135 - 145 mmol/L   Potassium 6.1 (H) 3.5 - 5.1 mmol/L   Chloride 97 (L) 98 - 111 mmol/L   CO2 18 (L) 22 - 32 mmol/L   Glucose, Bld 193 (H) 70 - 99 mg/dL   BUN 68 (H) 8 - 23 mg/dL   Creatinine, Ser 2.39 (H) 0.44 - 1.00 mg/dL   Calcium 7.9 (L) 8.9 - 10.3 mg/dL   GFR calc non Af Amer 21 (L) >60 mL/min   GFR calc Af Amer 24 (L) >60 mL/min   Anion gap 11 5 - 15    Comment: Performed at [facility], [address], [place], [place] [address]  CBC     Status: Abnormal   Collection Time: 01/21/19  5:54 AM   Result Value Ref Range   WBC 9.7 4.0 - 10.5 K/uL   RBC 2.68 (L) 3.87 - 5.11 MIL/uL   Hemoglobin 8.7 (L) 12.0 - 15.0 g/dL   HCT 27.6 (L) 36.0 - 46.0 %   MCV 103.0 (H) 80.0 - 100.0 fL   MCH 32.5 26.0 - 34.0 pg   MCHC 31.5 30.0 - 36.0 g/dL   RDW 18.6 (H) 11.5 - 15.5 %   Platelets 29 (LL) 150 - 400 K/uL    Comment: REPEATED TO VERIFY SPECIMEN CHECKED FOR CLOTS CONSISTENT WITH PREVIOUS RESULT THIS CRITICAL RESULT HAS VERIFIED AND BEEN CALLED TO RHEW J. BY LATISHA HENDERSON ON 03 20 2020 AT 0620, AND HAS BEEN READ BACK. CRITICAL RESULT VERIFIED    nRBC 0.0 0.0 - 0.2 %    Comment: Performed at [facility], [address], [place], [place] [address]  Glucose, capillary     Status: Abnormal   Collection Time: 01/21/19  7:47 AM  Result Value Ref Range   Glucose-Capillary 159 (H) 70 - 99 mg/dL  Glucose, capillary     Status: Abnormal   Collection Time: 01/21/19 12:00 PM  Result Value Ref Range   Glucose-Capillary 217 (H) 70 - 99 mg/dL      RADIOGRAPHY: Dg Chest 2 View  Result Date: 01/20/2019 CLINICAL DATA:  68 year old female with a history of shortness of breath EXAM: CHEST - 2 VIEW COMPARISON:  11/28/2018, 09/30/2017, 08/04/2017 FINDINGS: Cardiomediastinal silhouette unchanged in size and contour. No evidence of central vascular congestion. No interlobular septal thickening. No pneumothorax or pleural effusion. Right IJ port catheter, unchanged. No confluent airspace disease.  Low lung volumes. IMPRESSION: Negative for acute cardiopulmonary disease. Right IJ port catheter. Electronically Signed   By: Corrie Mckusick D.O.   On: 01/20/2019 14:04        ASSESSMENT and PLAN:  1.  Recurrent high-grade serous carcinoma of the peritoneum: - Rucaparib was stopped on 01/20/2019. - Patient is too weak to receive any form of active therapy for her recurrent cancer.  I have discussed this with the patient and her husband. - I have discussed her case with Dr. Dyann Kief.  Once her potassium normalizes  and her kidney function comes to her baseline, she could have abdominal paracentesis done prior to her discharge.  If her platelets does not improve above 50 prior to paracentesis, she can be transfused 1 unit of platelets prior to the procedure. - Please call me on my  cell phone if you have any questions over the weekend.  All questions were answered. The patient knows to call the clinic with any problems, questions or concerns. We can certainly see the patient much sooner if necessary.   Derek Jack

## 2019-01-21 NOTE — TOC Progression Note (Signed)
Transition of Care [facility]) - Progression Note    Patient Details  Name: Electra Paladino MRN: [id number] Date of Birth: 23-Nov-1950  [facility]) CM/SW Contact  Shade Flood, LCSW Phone Number: 01/21/2019, 1:39 PM  Clinical Narrative:    Followed up with pt's husband about bed offers. He requested additional referrals which were made. Bed offer accepted from [facility]. Pt's husband states that if pt improved enough to take her home, he might change directions and do that since he would not be able to visit her at the SNF. TOC team will follow.   Expected Discharge Plan: [place]    Expected Discharge Plan and Services Expected Discharge Plan: [place] In-house Referral: Hospice / [place] Choice: [place] arrangements for the past 2 months: Single Family Home                           Social Determinants of Health (SDOH) Interventions    Readmission Risk Interventions No flowsheet data found.

## 2019-01-21 NOTE — Progress Notes (Addendum)
Inpatient Diabetes Program Recommendations  AACE/ADA: New Consensus Statement on Inpatient Glycemic Control   Target Ranges:  Prepandial:   less than 140 mg/dL      Peak postprandial:   less than 180 mg/dL (1-2 hours)      Critically ill patients:  140 - 180 mg/dL  Results for KAJAH, SANTIZO (MRN [id number]) as of 01/21/2019 07:48  Ref. Range 01/20/2019 18:14 01/20/2019 22:12 01/21/2019 00:12 01/21/2019 04:02  Glucose-Capillary Latest Ref Range: 70 - 99 mg/dL 257 (H) 178 (H) 173 (H) 196 (H)   Results for NARYA, BEAVIN (MRN [id number]) as of 01/21/2019 07:48  Ref. Range 12/30/2017 10:32  Fructosamine Latest Ref Range: 190 - 270 umol/L 254 = 5.9% A1C   Results for EMON, MIGGINS (MRN [id number]) as of 01/21/2019 07:48  Ref. Range 12/07/2018 09:57  Hemoglobin A1C Latest Ref Range: 4.0 - 5.6 % 5.5   Review of Glycemic Control  Diabetes history: DM2 Outpatient Diabetes medications: Medtronic 670G insulin pump w/Humalog Current orders for Inpatient glycemic control: Novolog 0-9 units TID with meals, Novolog 0-5 units QHS  Inpatient Diabetes Program Recommendations:   Correction (SSI):  Please consider changing frequency of CBGs and SSI to Q4H.  Insulin - Basal: If glucose becomes consistently greater than 180 mg/dl with Novolog correction, please consider ordering Lantus 10 units Q24H.  A1C: A1C 5.5% on 12/07/18 indicating an average glucose of 111 mg/dl.  NOTE: Patient has DM2 and is followed by Dr. Cruzita Lederer for diabetes management and was last seen on 12/07/18. Patient uses a Medtronic 670G insulin pump with Humalog insulin as an outpatient. Spoke with patient over the phone and she states that she removed her insulin pump yesterday around noon. Patient reports that over the past several days she has been experiencing frequent hypoglycemia and she has had to suspend her insulin pump a lot.   Patient notes that she feels weak, tired, and feeling shaky. Patient reports that she does not feel she is shaky  from hypoglycemia. Patient states that no changes have been made with her insulin pump since last visit with Dr. Cruzita Lederer on 12/07/18.   Per office note on 12/07/18 by Dr. Cruzita Lederer the following should be insulin pump settings:   Basal insulin  12A 1.2 units/hour Total daily basal insulin: 28.8 units/24 hours  Carb Coverage 1:3.5 1 unit for every 3.5 grams of carbohydrates; except 1:4 grams with dinner  Insulin Sensitivity 1:20 1 unit drops blood glucose 20 mg/dl  Target Glucose Goals 12A 115-115 mg/dl 5A 115-115 mg/dl 8P 115-115 mg/dl  Active insulin time: 4 hours  Discussed current insulin orders and informed patient that low dose basal insulin would be requested and MD would order if agreeable. Patient agreeable to using SQ insulin at this time.  Patient verbalized understanding of information discussed and states that she does not have any further questions related to diabetes at this time.  Thanks, Barnie Alderman, RN, MSN, CDE Diabetes Coordinator Inpatient Diabetes Program [phone] (Team Pager from 8am to 5pm)

## 2019-01-21 NOTE — Progress Notes (Signed)
Palliative Medicine consult order noted. PMT provider is scheduled to return to [facility] on January 24, 2019. If the patient remains hospitalized, the consult will be evaluated at that time. If recommendations are needed in the interim, please call our office at [phone].   Santiago Graf G. Nonna Renninger, RN, BSN, CHPN Palliative Medicine Team 01/21/2019 9:32 AM Office [phone] 

## 2019-01-21 NOTE — Progress Notes (Signed)
Patient now on 8L higflow sats currently at 94% respirations at 28 ,HR raging bet 114-120. I will continue to monitor patient as the shfit progresses. Patient denies shortness of breath or respiratory distress. Vitals 102/70 T98.5

## 2019-01-21 NOTE — Progress Notes (Signed)
PROGRESS NOTE    Ellen Hunt  OZD:[id number] DOB: 06/17/1951 DOA: 01/20/2019 PCP: Eustaquio Maize, MD (Inactive)     Brief Narrative:   68 y.o. female with medical history significant for High-grade carcinoma of the peritoneum, withmalignant ascites, DM2, CKD3, HTN, presented to the ED with complaints of generalized weakness over the past month progressive, and abnormal lab from her oncologist office.  Patient went to see her oncologist Dr. Delton Coombes today, blood work showed hyperkalemia, hyponatremia.  Also reports progressive abdominal distention since last paracentesis.  Assessment & Plan: 1-hyperkalemia and hyponatremia -In the setting of worsening renal function and dehydration. -Continue Lokelma -Potassium 6.1 -Continue monitoring on telemetry. -Continue IV fluids.  2-metastatic ovarian cancer -Per oncology service patient is too weak and frail for any further chemotherapeutic treatment -Focus on symptomatic management and hospice referral.  3-acute on chronic renal failure -In the setting of dehydration and prerenal azotemia -Avoid nephrotoxic agents-provide fluid resuscitation -Follow renal function trend.  4-malignant ascites -Planning to perform palliative paracentesis once platelets are stable.  5-thrombocytopenia -In the setting of malignancy and chemotherapeutic agent usage. -Follow platelets trend -No transfusion currently -No signs of overt bleeding.  6-diabetes mellitus -Continue sliding scale insulin and follow CBGs -Based on fluctuation will further adjust hypoglycemic regimen and may be started on long-acting.  7-generalized weakness -In the setting of further decline from underlying conditions, including malignancy and the use of chemotherapy. -PT evaluation has been performed and recommendations made for short-term rehab skilled nursing facility. -Continue supportive care.    DVT prophylaxis: SCDs Code Status: Full code Family  Communication: Husband at bedside. Disposition Plan: Remains inpatient, continue fluid resuscitation, continue to follow electrolytes and platelets count.  Once platelets above 50,000 plan is to perform paracentesis in a palliative approach to assist controlling ascites.  Patient has been recommended to go to skilled nursing facility short-term for rehabilitation and depending further clinical response transition to hospice.  Consultants:   Oncology service  Palliative care  Procedures:   Paracentesis once platelets improved.  Antimicrobials:  Anti-infectives (From admission, onward)   None       Subjective: Afebrile, no chest pain, no nausea, no vomiting.  Reporting some shortness of breath and feeling very weak/deconditioned.  Objective: Vitals:   01/20/19 2209 01/21/19 0636 01/21/19 0650 01/21/19 1510  BP: 112/61 120/63  (!) 109/92  Pulse: (!) 109 (!) 116  (!) 116  Resp: (!) 29 18  19   Temp: 98.7 F (37.1 C) 98.8 F (37.1 C)  98.2 F (36.8 C)  TempSrc: Oral Oral    SpO2: 100% 95%  96%  Weight:   84.8 kg   Height:        Intake/Output Summary (Last 24 hours) at 01/21/2019 1835 Last data filed at 01/21/2019 0900 Gross per 24 hour  Intake 1401.42 ml  Output 0 ml  Net 1401.42 ml   Filed Weights   01/20/19 1239 01/21/19 0650  Weight: 81.2 kg 84.8 kg    Examination: General exam: Alert, awake, oriented x 3; weak, deconditioned and chronically ill in appearance.  Patient reports abdominal discomfort and mild shortness of breath. Respiratory system: No using accessory muscle.  Mild tachypnea with minimal exertion.  Expressing difficulty breathing most likely triggered by ongoing ascites. Cardiovascular system:RRR. No murmurs, rubs, gallops. Gastrointestinal system: Abdomen is distended, with positive fluid wave, no tenderness on palpation.  Positive bowel sounds.   Central nervous system: Alert and oriented. No focal neurological deficits. Extremities: No cyanosis or  clubbing.  Positive trace  edema bilaterally. Skin: No rashes, no petechiae. Psychiatry: Flat affect appreciated.  No agitation or hallucinations.   Data Reviewed: I have personally reviewed following labs and imaging studies  CBC: Recent Labs  Lab 01/20/19 1017 01/20/19 1339 01/21/19 0554  WBC 11.4* 11.5* 9.7  NEUTROABS 9.9* 10.2*  --   HGB 9.9* 9.6* 8.7*  HCT 31.8* 30.6* 27.6*  MCV 105.3* 102.7* 103.0*  PLT 33* 34* 29*   Basic Metabolic Panel: Recent Labs  Lab 01/20/19 1017 01/20/19 1339 01/20/19 2244 01/21/19 0554  NA 127* 126*  --  126*  K 6.6* 6.6* 6.2* 6.1*  CL 97* 96*  --  97*  CO2 19* 19*  --  18*  GLUCOSE 186* 197*  --  193*  BUN 64* 67*  --  68*  CREATININE 2.17* 2.27*  --  2.39*  CALCIUM 8.3* 8.1*  --  7.9*  MG 2.6*  --   --   --    GFR: Estimated Creatinine Clearance: 23.7 mL/min (A) (by C-G formula based on SCr of 2.39 mg/dL (H)).   Liver Function Tests: Recent Labs  Lab 01/20/19 1017 01/20/19 1339  AST 34 28  ALT 21 21  ALKPHOS 97 94  BILITOT 0.7 0.8  PROT 5.3* 5.3*  ALBUMIN 2.2* 2.2*   Coagulation Profile: Recent Labs  Lab 01/20/19 1339  INR 1.2   CBG: Recent Labs  Lab 01/21/19 0012 01/21/19 0402 01/21/19 0747 01/21/19 1200 01/21/19 1700  GLUCAP 173* 196* 159* 217* 86   Urine analysis:    Component Value Date/Time   COLORURINE YELLOW 11/28/2018 1430   APPEARANCEUR HAZY (A) 11/28/2018 1430   LABSPEC 1.020 11/28/2018 1430   PHURINE 5.0 11/28/2018 1430   GLUCOSEU NEGATIVE 11/28/2018 1430   HGBUR NEGATIVE 11/28/2018 1430   BILIRUBINUR NEGATIVE 11/28/2018 1430   KETONESUR NEGATIVE 11/28/2018 1430   PROTEINUR NEGATIVE 11/28/2018 1430   NITRITE NEGATIVE 11/28/2018 1430   LEUKOCYTESUR NEGATIVE 11/28/2018 1430    Recent Results (from the past 240 hour(s))  Culture, blood (Routine x 2)     Status: None (Preliminary result)   Collection Time: 01/20/19  1:39 PM  Result Value Ref Range Status   Specimen Description BLOOD DRAWN  BY RN SITE NOT SPECIFIED  Final   Special Requests   Final    BOTTLES DRAWN AEROBIC AND ANAEROBIC Blood Culture adequate volume   Culture   Final    NO GROWTH < 24 HOURS Performed at [facility], [address] [place], [place] [address]    Report Status PENDING  Incomplete  Culture, blood (Routine x 2)     Status: None (Preliminary result)   Collection Time: 01/20/19  3:42 PM  Result Value Ref Range Status   Specimen Description RIGHT ANTECUBITAL  Final   Special Requests   Final    BOTTLES DRAWN AEROBIC AND ANAEROBIC Blood Culture adequate volume   Culture   Final    NO GROWTH < 24 HOURS Performed at [facility], [address], [place], [place] [address]    Report Status PENDING  Incomplete     Radiology Studies: Dg Chest 2 View  Result Date: 01/20/2019 CLINICAL DATA:  68 year old female with a history of shortness of breath EXAM: CHEST - 2 VIEW COMPARISON:  11/28/2018, 09/30/2017, 08/04/2017 FINDINGS: Cardiomediastinal silhouette unchanged in size and contour. No evidence of central vascular congestion. No interlobular septal thickening. No pneumothorax or pleural effusion. Right IJ port catheter, unchanged. No confluent airspace disease.  Low lung volumes. IMPRESSION: Negative  for acute cardiopulmonary disease. Right IJ port catheter. Electronically Signed   By: Corrie Mckusick D.O.   On: 01/20/2019 14:04    Scheduled Meds: . feeding supplement (GLUCERNA SHAKE)  237 mL Oral TID BM  . insulin aspart  0-5 Units Subcutaneous QHS  . insulin aspart  0-9 Units Subcutaneous TID WC  . mouth rinse  15 mL Mouth Rinse BID  . sodium zirconium cyclosilicate  5 g Oral Daily   Continuous Infusions:   LOS: 0 days    Time spent: 30 minutes   Barton Dubois, MD [facility] Pager [phone]  01/21/2019, 6:35 PM

## 2019-01-21 NOTE — Progress Notes (Signed)
1900-0700 shift: Pt declined to use Insulin pump. Ordered obtained for insulin coverage. 0200: Dr. Olevia Bowens notified of K 6.2 and no urine output  since admission. Pt states she does not feel the urge to void. Bladder scan noted 200cc plus. Per Dr. Sid Falcon to monitor.Plts 29. Dr. Olevia Bowens notified per page.

## 2019-01-21 NOTE — Progress Notes (Signed)
Bladder scan completed showing greater than 200 mls; Kennyth Lose, RN notified.

## 2019-01-21 NOTE — NC FL2 (Signed)
[place] LEVEL OF CARE SCREENING TOOL     IDENTIFICATION  Patient Name: Ellen Hunt Birthdate: 03-01-1951 Sex: female Admission Date (Current Location): 01/20/2019  [facility] and [place] Number:  [employer] and Address:  [place] [address], [place]      Provider Number: [id number]  Attending Physician Name and Address:  Barton Dubois, MD  Relative Name and Phone Number:  Kimiyo Carmicheal [phone]    Current Level of Care: Hospital Recommended Level of Care: [place] Prior Approval Number:    Date Approved/Denied:   PASRR Number: [id number]  Discharge Plan: SNF    Current Diagnoses: Patient Active Problem List   Diagnosis Date Noted  . Malignant neoplasm of ovary ([place])   . Diabetes mellitus due to underlying condition with diabetic nephropathy, with long-term current use of insulin ([place])   . Ascites, malignant 11/28/2018  . Hyperkalemia 10/08/2018  . Severe dehydration 10/08/2018  . Hypermagnesemia 10/08/2018  . Hyperglycemia 10/08/2018  . Insulin pump in place 10/08/2018  . Acute renal failure superimposed on stage 3 chronic kidney disease ([place]) 10/08/2018  . Malignant ascites 07/02/2018  . Drug-induced hypotension 06/11/2018  . CKD (chronic kidney disease), stage III ([place]) 03/08/2018  . Hypomagnesemia 02/08/2018  . Peripheral neuropathy due to chemotherapy ([place]) 11/10/2017  . Elevated liver enzymes 08/25/2017  . Pancytopenia, acquired ([place]) 08/14/2017  . E-coli UTI 08/05/2017  . Thrombocytopenia ([place]) 08/04/2017  . Sepsis secondary to UTI ([place]) 08/04/2017  . AKI (acute kidney injury) ([place]) 08/04/2017  . Goals of care, counseling/discussion 05/14/2017  . Edema leg 12/18/2016  . Influenza with pneumonia 11/20/2016  . Sepsis ([place]) 11/20/2016  . Antineoplastic chemotherapy induced pancytopenia ([place]) 11/20/2016  . HCAP (healthcare-associated pneumonia) 11/19/2016  . Infection of urinary  tract 10/14/2016  . B12 deficiency 08/31/2016  . Anemia due to antineoplastic chemotherapy 08/09/2016  . Chemotherapy-induced neuropathy ([place]) 08/07/2016  . Glaucoma 08/07/2016  . Genetic testing 06/27/2016  . Family history of breast cancer   . Shingles 05/09/2016  . 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 Nausea with vomiting 04/30/2016  . Sciatica neuralgia 02/12/2015  . Severe obesity (BMI >= 40) ([place]) 10/12/2014  . Hypertension 09/04/2014  . Obesity 09/04/2014  . Type 2 diabetes mellitus with complication, with long term current use of insulin pump ([place]) 01/25/2014  . Vitamin D insufficiency 01/25/2014  . Hyperlipidemia 01/25/2014  . Osteopenia 01/25/2014    Orientation RESPIRATION BLADDER Height & Weight     Self, Time, Situation, Place  Normal Continent Weight: 84.8 kg Height:  5\' 2"  (157.5 cm)  BEHAVIORAL SYMPTOMS/MOOD NEUROLOGICAL BOWEL NUTRITION STATUS      Continent Diet(regular)  AMBULATORY STATUS COMMUNICATION OF NEEDS Skin   Extensive Assist Verbally Normal                       Personal Care Assistance Level of Assistance  Bathing, Feeding, Dressing Bathing Assistance: Limited assistance Feeding assistance: Independent Dressing Assistance: Limited assistance     Functional Limitations Info  Sight, Hearing, Speech Sight Info: Impaired Hearing Info: Adequate Speech Info: Adequate    SPECIAL CARE FACTORS FREQUENCY  PT (By licensed PT)     PT Frequency: 5 days/week              Contractures Contractures Info: Not present    Additional Factors Info  Code Status, Allergies, Insulin Sliding Scale Code Status Info: full Allergies Info: avandia, micronase, actos  Insulin Sliding Scale Info: on insulin pump at home, see DC summary       Current Medications (01/21/2019):  This is the current hospital active medication list Current Facility-Administered Medications  Medication Dose Route Frequency Provider Last Rate  Last Dose  . acetaminophen (TYLENOL) tablet 650 mg  650 mg Oral Q6H PRN Emokpae, Ejiroghene E, MD   650 mg at 01/21/19 0930   Or  . acetaminophen (TYLENOL) suppository 650 mg  650 mg Rectal Q6H PRN Emokpae, Ejiroghene E, MD      . feeding supplement (GLUCERNA SHAKE) (GLUCERNA SHAKE) liquid 237 mL  237 mL Oral TID BM Emokpae, Ejiroghene E, MD   237 mL at 01/21/19 0930  . insulin aspart (novoLOG) injection 0-5 Units  0-5 Units Subcutaneous QHS Bodenheimer, Charles A, NP      . insulin aspart (novoLOG) injection 0-9 Units  0-9 Units Subcutaneous TID WC Bodenheimer, Charles A, NP   2 Units at 01/21/19 [phone]  . MEDLINE mouth rinse  15 mL Mouth Rinse BID Emokpae, Ejiroghene E, MD   15 mL at 01/20/19 2200  . ondansetron (ZOFRAN) tablet 4 mg  4 mg Oral Q6H PRN Emokpae, Ejiroghene E, MD       Or  . ondansetron (ZOFRAN) injection 4 mg  4 mg Intravenous Q6H PRN Emokpae, Ejiroghene E, MD      . polyethylene glycol (MIRALAX / GLYCOLAX) packet 17 g  17 g Oral Daily PRN Emokpae, Ejiroghene E, MD      . sodium zirconium cyclosilicate (LOKELMA) packet 10 g  10 g Oral Once Emokpae, Ejiroghene E, MD         Discharge Medications: Please see discharge summary for a list of discharge medications.  Relevant Imaging Results:  Relevant Lab Results:   Additional Information SS# [id number], chemo treatments being disconintued  Roda Shutters, Margretta Sidle, RN

## 2019-01-21 NOTE — Evaluation (Signed)
Physical Therapy Evaluation Patient Details Name: Ellen Hunt MRN: [id number] DOB: 10/01/1951 Today's Date: 01/21/2019   History of Present Illness  Ellen Hunt is a 68 y.o. female with medical history significant for High-grade carcinoma of the peritoneum, withmalignant ascites, DM2, CKD3, HTN, presented to the ED with complaints of generalized weakness over the past month progressive, and abnormal lab from her oncologist office.  Patient went to see her oncologist Dr. Delton Coombes today, blood work showed hyperkalemia, hyponatremia.     Clinical Impression  Patient limited for functional mobility as stated below secondary to BLE weakness, fatigue and poor standing balance.  Patient able to transfer to [facility] to urinate and tolerated sitting up in chair after therapy.  Patient will benefit from continued physical therapy in hospital and recommended venue below to increase strength, balance, endurance for safe ADLs and gait.    Follow Up Recommendations SNF    Equipment Recommendations  None recommended by PT    Recommendations for Other Services       Precautions / Restrictions Precautions Precautions: Fall Restrictions Weight Bearing Restrictions: No      Mobility  Bed Mobility Overal bed mobility: Needs Assistance Bed Mobility: Supine to Sit     Supine to sit: Min assist;Mod assist     General bed mobility comments: slow labored movement  Transfers Overall transfer level: Needs assistance Equipment used: Rolling walker (2 wheeled) Transfers: Sit to/from [facility] Sit to Stand: Min assist Stand pivot transfers: Mod assist       General transfer comment: unsteady on feet  Ambulation/Gait Ambulation/Gait assistance: Mod assist Gait Distance (Feet): 4 Feet Assistive device: Rolling walker (2 wheeled) Gait Pattern/deviations: Decreased step length - right;Decreased step length - left;Decreased stride length Gait velocity: decreased   General  Gait Details: limited to 5-6 slow unsteady labored steps at bedside mostly due to c/o weakness/fatigue  Stairs            Wheelchair Mobility    Modified Rankin (Stroke Patients Only)       Balance Overall balance assessment: Needs assistance Sitting-balance support: Feet supported;Bilateral upper extremity supported Sitting balance-Leahy Scale: Fair     Standing balance support: Bilateral upper extremity supported;During functional activity Standing balance-Leahy Scale: Fair Standing balance comment: using RW                             Pertinent Vitals/Pain Pain Assessment: Faces Faces Pain Scale: Hurts little more Pain Location: chronic RLE Pain Descriptors / Indicators: Discomfort;Aching Pain Intervention(s): Limited activity within patient's tolerance;Monitored during session    Home Living Family/patient expects to be discharged to:: Private residence Living Arrangements: Spouse/significant other Available Help at Discharge: Family;Available 24 hours/day Type of Home: House Home Access: Stairs to enter Entrance Stairs-Rails: None Entrance Stairs-Number of Steps: 1 Home Layout: One level Home Equipment: Walker - 4 wheels;Cane - single point;Grab bars - tub/shower Additional Comments: using a borrowed transport chair    Prior Function Level of Independence: Needs assistance   Gait / Transfers Assistance Needed: 2 weeks ago was ambulating with Rollator, since has been mostly pushed around in transport chair by her spouse and limited to a few steps for transfers  ADL's / Homemaking Assistance Needed: assisted by spouse        Hand Dominance        Extremity/Trunk Assessment   Upper Extremity Assessment Upper Extremity Assessment: Generalized weakness    Lower Extremity Assessment Lower Extremity Assessment: Generalized  weakness    Cervical / Trunk Assessment Cervical / Trunk Assessment: Normal  Communication   Communication: No  difficulties  Cognition Arousal/Alertness: Awake/alert Behavior During Therapy: WFL for tasks assessed/performed Overall Cognitive Status: Within Functional Limits for tasks assessed                                        General Comments      Exercises     Assessment/Plan    PT Assessment Patient needs continued PT services  PT Problem List Decreased strength;Decreased activity tolerance;Decreased balance;Decreased mobility       PT Treatment Interventions Therapeutic exercise;Gait training;Stair training;Functional mobility training;Therapeutic activities;Patient/family education    PT Goals (Current goals can be found in the Care Plan section)  Acute Rehab PT Goals Patient Stated Goal: return home PT Goal Formulation: With patient/family Time For Goal Achievement: 02/04/19 Potential to Achieve Goals: Good    Frequency Min 3X/week   Barriers to discharge        Co-evaluation               AM-PAC PT "6 Clicks" Mobility  Outcome Measure Help needed turning from your back to your side while in a flat bed without using bedrails?: A Lot Help needed moving from lying on your back to sitting on the side of a flat bed without using bedrails?: A Lot Help needed moving to and from a bed to a chair (including a wheelchair)?: A Lot Help needed standing up from a chair using your arms (e.g., wheelchair or bedside chair)?: A Lot Help needed to walk in hospital room?: A Lot Help needed climbing 3-5 steps with a railing? : A Lot 6 Click Score: 12    End of Session Equipment Utilized During Treatment: Gait belt Activity Tolerance: Patient tolerated treatment well;Patient limited by fatigue Patient left: with call bell/phone within reach;in chair;with family/visitor present Nurse Communication: Mobility status PT Visit Diagnosis: Unsteadiness on feet (R26.81);Other abnormalities of gait and mobility (R26.89);Muscle weakness (generalized) (M62.81)    Time:  1324-4010 PT Time Calculation (min) (ACUTE ONLY): 27 min   Charges:   PT Evaluation $PT Eval Moderate Complexity: 1 Mod PT Treatments $Therapeutic Activity: 23-37 mins        12:09 PM, 01/21/19 Lonell Grandchild, MPT Physical Therapist with [facility] [phone] office [phone] mobile phone

## 2019-01-21 NOTE — Plan of Care (Signed)
  Problem: Acute Rehab PT Goals(only PT should resolve) Goal: Pt Will Go Supine/Side To Sit Outcome: Progressing Flowsheets (Taken 01/21/2019 1210) Pt will go Supine/Side to Sit: with minimal assist Goal: Patient Will Transfer Sit To/From Stand Outcome: Progressing Flowsheets (Taken 01/21/2019 1210) Patient will transfer sit to/from stand: with min guard assist Goal: Pt Will Transfer Bed To Chair/Chair To Bed Outcome: Progressing Flowsheets (Taken 01/21/2019 1210) Pt will Transfer Bed to Chair/Chair to Bed: min guard assist; with min assist Goal: Pt Will Ambulate Outcome: Progressing Flowsheets (Taken 01/21/2019 1210) Pt will Ambulate: with minimal assist; with rolling walker; 25 feet   12:11 PM, 01/21/19 Lonell Grandchild, MPT Physical Therapist with [facility] [phone] office [phone] mobile phone

## 2019-01-22 LAB — CBC
HCT: 25.8 % — ABNORMAL LOW (ref 36.0–46.0)
Hemoglobin: 8.1 g/dL — ABNORMAL LOW (ref 12.0–15.0)
MCH: 32.4 pg (ref 26.0–34.0)
MCHC: 31.4 g/dL (ref 30.0–36.0)
MCV: 103.2 fL — ABNORMAL HIGH (ref 80.0–100.0)
Platelets: 18 10*3/uL — CL (ref 150–400)
RBC: 2.5 MIL/uL — ABNORMAL LOW (ref 3.87–5.11)
RDW: 18.7 % — ABNORMAL HIGH (ref 11.5–15.5)
WBC: 10.5 10*3/uL (ref 4.0–10.5)
nRBC: 0 % (ref 0.0–0.2)

## 2019-01-22 LAB — GLUCOSE, CAPILLARY
Glucose-Capillary: 132 mg/dL — ABNORMAL HIGH (ref 70–99)
Glucose-Capillary: 146 mg/dL — ABNORMAL HIGH (ref 70–99)
Glucose-Capillary: 174 mg/dL — ABNORMAL HIGH (ref 70–99)
Glucose-Capillary: 215 mg/dL — ABNORMAL HIGH (ref 70–99)
Glucose-Capillary: 243 mg/dL — ABNORMAL HIGH (ref 70–99)

## 2019-01-22 LAB — BASIC METABOLIC PANEL
Anion gap: 13 (ref 5–15)
BUN: 74 mg/dL — ABNORMAL HIGH (ref 8–23)
CALCIUM: 7.8 mg/dL — AB (ref 8.9–10.3)
CO2: 18 mmol/L — ABNORMAL LOW (ref 22–32)
Chloride: 96 mmol/L — ABNORMAL LOW (ref 98–111)
Creatinine, Ser: 2.46 mg/dL — ABNORMAL HIGH (ref 0.44–1.00)
GFR calc Af Amer: 23 mL/min — ABNORMAL LOW (ref >60)
GFR calc non Af Amer: 20 mL/min — ABNORMAL LOW (ref >60)
Glucose, Bld: 154 mg/dL — ABNORMAL HIGH (ref 70–99)
Potassium: 5.9 mmol/L — ABNORMAL HIGH (ref 3.5–5.1)
Sodium: 127 mmol/L — ABNORMAL LOW (ref 135–145)

## 2019-01-22 MED ORDER — FUROSEMIDE 10 MG/ML IJ SOLN
20.0000 mg | Freq: Every day | INTRAMUSCULAR | Status: DC
  Administered 2019-01-22 – 2019-01-25 (×4): 20 mg via INTRAVENOUS
  Filled 2019-01-22 (×5): qty 2

## 2019-01-22 MED ORDER — ALBUMIN HUMAN 25 % IV SOLN
50.0000 g | Freq: Three times a day (TID) | INTRAVENOUS | Status: AC
  Administered 2019-01-22 – 2019-01-24 (×6): 50 g via INTRAVENOUS
  Filled 2019-01-22: qty 100
  Filled 2019-01-22: qty 200
  Filled 2019-01-22 (×2): qty 150
  Filled 2019-01-22 (×3): qty 50
  Filled 2019-01-22: qty 150
  Filled 2019-01-22: qty 100

## 2019-01-22 MED ORDER — SODIUM BICARBONATE 650 MG PO TABS
650.0000 mg | ORAL_TABLET | Freq: Two times a day (BID) | ORAL | Status: DC
  Administered 2019-01-22 – 2019-01-25 (×7): 650 mg via ORAL
  Filled 2019-01-22 (×8): qty 1

## 2019-01-22 NOTE — Progress Notes (Signed)
Pt OOB from chair to bed, urinated on floor. Pt unable to urinate on BSC or w/ purwick. Abd distended, no distress noted.  daughter at bedside/

## 2019-01-22 NOTE — Progress Notes (Signed)
myself and AM nurse attempted to help patient back to the bed from the  chair. During ambulation patient states she is tired and became weak. At this time we both had patient  sit on the floor to avoid a possible fall. Additional staff was called to the room to help lift patient back to the bed as she stated her legs felt weak. We were able to get patient back to standing position and place her on the bed

## 2019-01-22 NOTE — Progress Notes (Signed)
PROGRESS NOTE    Ellen Hunt  TIR:[id number] DOB: 1951-01-12 DOA: 01/20/2019 PCP: Eustaquio Maize, MD (Inactive)     Brief Narrative:   68 y.o. female with medical history significant for High-grade carcinoma of the peritoneum, withmalignant ascites, DM2, CKD3, HTN, presented to the ED with complaints of generalized weakness over the past month progressive, and abnormal lab from her oncologist office.  Patient went to see her oncologist Dr. Delton Coombes today, blood work showed hyperkalemia, hyponatremia.  Also reports progressive abdominal distention since last paracentesis.  Assessment & Plan: 1-hyperkalemia and hyponatremia -In the setting of worsening renal function and dehydration. -Continue Lokelma -Potassium 5.9 -Continue monitoring on telemetry. -Continue IV fluids and as mentioned below will start some lasix.Marland Kitchen  2-metastatic ovarian cancer -Per oncology service patient is too weak and frail for any further chemotherapeutic treatment -Focus on symptomatic management and hospice referral. -CM order in place for that.  3-acute on chronic renal failure -In the setting of dehydration and prerenal azotemia -Avoid nephrotoxic agents-provide fluid resuscitation -Follow renal function trend, especially now that lasix would be added..  4-malignant ascites -Planning to perform palliative paracentesis once platelets are stable. -Will attempt treatment with Lasix and albumin.  5-thrombocytopenia -In the setting of malignancy and chemotherapeutic agent usage. -Follow platelets trend -Platelet count down to 19,000; will anticipate platelet transfusion prior to paracentesis. -No signs of overt bleeding.  6-diabetes mellitus -Continue sliding scale insulin and follow CBGs -Based on fluctuation will further adjust hypoglycemic regimen and may be started on long-acting.  7-generalized weakness -In the setting of further decline from underlying conditions, including malignancy and  the use of chemotherapy. -PT evaluation has been performed and recommendations made for short-term rehab skilled nursing facility. -Continue supportive care.   DVT prophylaxis: SCDs Code Status: Full code Family Communication: Husband at bedside. Disposition Plan: Remains inpatient, continue fluid resuscitation, continue to follow electrolytes and platelets count.  Platelets down to 19,000, discussed with interventional radiologist and will not plan for potential paracentesis to be done on 01/23/19.  In between will provide Lasix and albumin.  Follow electrolytes and renal function.  Prior to procedure be done patient will receive platelet transfusion.  Consultants:   Oncology service  Palliative care  Procedures:   Paracentesis once platelets improved.  Antimicrobials:  Anti-infectives (From admission, onward)   None      Subjective: No chest pain, no nausea, no vomiting.  Denies shortness of breath, but is requiring oxygen supplementation to maintain O2 sat.  Overnight with episode of elevated heart rate and oxygen desaturation.  Objective: Vitals:   01/21/19 2041 01/21/19 2052 01/21/19 2128 01/22/19 0429  BP:  108/60  (!) 119/58  Pulse:  (!) 121  (!) 122  Resp:  (!) 22  20  Temp:  98.1 F (36.7 C)  98.6 F (37 C)  TempSrc:  Oral  Oral  SpO2: 96% (!) 78% 92% 96%  Weight:    85.8 kg  Height:        Intake/Output Summary (Last 24 hours) at 01/22/2019 1654 Last data filed at 01/21/2019 1700 Gross per 24 hour  Intake 360 ml  Output 100 ml  Net 260 ml   Filed Weights   01/20/19 1239 01/21/19 0650 01/22/19 0429  Weight: 81.2 kg 84.8 kg 85.8 kg    Examination: General exam: Alert, awake, oriented x 3; with increased shortness of breath and requiring oxygen supplementation.  Overnight patient had episode of elevated heart rate and oxygen desaturation.  Denies any pain. Respiratory system:  No using accessory muscles, no wheezing, no crackles. Cardiovascular system:  Sinus tachycardia, no rubs, no gallops Gastrointestinal system: Abdomen is distended, with positive fluid wave, positive bowel sounds, no tenderness on palpation.   Central nervous system: Alert and oriented. No focal neurological deficits. Extremities: No no cyanosis or clubbing; trace edema bilaterally appreciated. Skin: No rashes, no petechiae. Psychiatry: Judgement and insight appear normal.  Flat affect.    Data Reviewed: I have personally reviewed following labs and imaging studies  CBC: Recent Labs  Lab 01/20/19 1017 01/20/19 1339 01/21/19 0554 01/22/19 1020  WBC 11.4* 11.5* 9.7 10.5  NEUTROABS 9.9* 10.2*  --   --   HGB 9.9* 9.6* 8.7* 8.1*  HCT 31.8* 30.6* 27.6* 25.8*  MCV 105.3* 102.7* 103.0* 103.2*  PLT 33* 34* 29* 18*   Basic Metabolic Panel: Recent Labs  Lab 01/20/19 1017 01/20/19 1339 01/20/19 2244 01/21/19 0554 01/22/19 1020  NA 127* 126*  --  126* 127*  K 6.6* 6.6* 6.2* 6.1* 5.9*  CL 97* 96*  --  97* 96*  CO2 19* 19*  --  18* 18*  GLUCOSE 186* 197*  --  193* 154*  BUN 64* 67*  --  68* 74*  CREATININE 2.17* 2.27*  --  2.39* 2.46*  CALCIUM 8.3* 8.1*  --  7.9* 7.8*  MG 2.6*  --   --   --   --    GFR: Estimated Creatinine Clearance: 23.2 mL/min (A) (by C-G formula based on SCr of 2.46 mg/dL (H)).   Liver Function Tests: Recent Labs  Lab 01/20/19 1017 01/20/19 1339  AST 34 28  ALT 21 21  ALKPHOS 97 94  BILITOT 0.7 0.8  PROT 5.3* 5.3*  ALBUMIN 2.2* 2.2*   Coagulation Profile: Recent Labs  Lab 01/20/19 1339  INR 1.2   CBG: Recent Labs  Lab 01/21/19 1700 01/21/19 2052 01/22/19 0055 01/22/19 0745 01/22/19 1203  GLUCAP 86 127* 146* 132* 174*   Urine analysis:    Component Value Date/Time   COLORURINE YELLOW 11/28/2018 1430   APPEARANCEUR HAZY (A) 11/28/2018 1430   LABSPEC 1.020 11/28/2018 1430   PHURINE 5.0 11/28/2018 1430   GLUCOSEU NEGATIVE 11/28/2018 1430   HGBUR NEGATIVE 11/28/2018 1430   BILIRUBINUR NEGATIVE 11/28/2018 1430    KETONESUR NEGATIVE 11/28/2018 1430   PROTEINUR NEGATIVE 11/28/2018 1430   NITRITE NEGATIVE 11/28/2018 1430   LEUKOCYTESUR NEGATIVE 11/28/2018 1430    Recent Results (from the past 240 hour(s))  Culture, blood (Routine x 2)     Status: None (Preliminary result)   Collection Time: 01/20/19  1:39 PM  Result Value Ref Range Status   Specimen Description BLOOD DRAWN BY RN SITE NOT SPECIFIED  Final   Special Requests   Final    BOTTLES DRAWN AEROBIC AND ANAEROBIC Blood Culture adequate volume   Culture   Final    NO GROWTH 2 DAYS Performed at [facility], [address], [place], [place] [address]    Report Status PENDING  Incomplete  Culture, blood (Routine x 2)     Status: None (Preliminary result)   Collection Time: 01/20/19  3:42 PM  Result Value Ref Range Status   Specimen Description RIGHT ANTECUBITAL  Final   Special Requests   Final    BOTTLES DRAWN AEROBIC AND ANAEROBIC Blood Culture adequate volume   Culture   Final    NO GROWTH 2 DAYS Performed at [facility], [address], [place], [place] [address]    Report Status PENDING  Incomplete     Radiology Studies: Dg Chest Port 1 View  Result Date: 01/21/2019 CLINICAL DATA:  Increased oxygen demand EXAM: PORTABLE CHEST 1 VIEW COMPARISON:  01/20/2019, 11/28/2018 FINDINGS: Right-sided central venous port tip over the cavoatrial region. No focal airspace disease or effusion. Stable cardiomediastinal silhouette. No pneumothorax. IMPRESSION: No active disease. Electronically Signed   By: Donavan Foil M.D.   On: 01/21/2019 21:44    Scheduled Meds: . feeding supplement (GLUCERNA SHAKE)  237 mL Oral TID BM  . furosemide  20 mg Intravenous Daily  . insulin aspart  0-5 Units Subcutaneous QHS  . insulin aspart  0-9 Units Subcutaneous TID WC  . mouth rinse  15 mL Mouth Rinse BID  . sodium bicarbonate  650 mg Oral BID  . sodium zirconium cyclosilicate  5 g Oral Daily   Continuous Infusions: . albumin human       LOS: 1 day     Time spent: 30 minutes   Barton Dubois, MD [facility] Pager [phone]  01/22/2019, 4:54 PM

## 2019-01-22 NOTE — Progress Notes (Signed)
Nurse called due to pt sob and decreased spo2 on 10lpm HFNC increased up to 12.5 spo2 back up to 99% pt resting comfortable  No distress RT will continue to monitor

## 2019-01-22 NOTE — Progress Notes (Signed)
OOB to chair, pt able to urinate while standing. Resting quitely, daughter at  Bedside.

## 2019-01-23 LAB — CBC
HCT: 19.7 % — ABNORMAL LOW (ref 36.0–46.0)
Hemoglobin: 6.2 g/dL — CL (ref 12.0–15.0)
MCH: 32.6 pg (ref 26.0–34.0)
MCHC: 31.5 g/dL (ref 30.0–36.0)
MCV: 103.7 fL — ABNORMAL HIGH (ref 80.0–100.0)
Platelets: 16 10*3/uL — CL (ref 150–400)
RBC: 1.9 MIL/uL — ABNORMAL LOW (ref 3.87–5.11)
RDW: 18.9 % — ABNORMAL HIGH (ref 11.5–15.5)
WBC: 7.2 10*3/uL (ref 4.0–10.5)
nRBC: 0 % (ref 0.0–0.2)

## 2019-01-23 LAB — BASIC METABOLIC PANEL
Anion gap: 10 (ref 5–15)
BUN: 76 mg/dL — ABNORMAL HIGH (ref 8–23)
CO2: 21 mmol/L — ABNORMAL LOW (ref 22–32)
Calcium: 8.3 mg/dL — ABNORMAL LOW (ref 8.9–10.3)
Chloride: 97 mmol/L — ABNORMAL LOW (ref 98–111)
Creatinine, Ser: 2.4 mg/dL — ABNORMAL HIGH (ref 0.44–1.00)
GFR calc Af Amer: 24 mL/min — ABNORMAL LOW (ref >60)
GFR calc non Af Amer: 20 mL/min — ABNORMAL LOW (ref >60)
Glucose, Bld: 139 mg/dL — ABNORMAL HIGH (ref 70–99)
Potassium: 5 mmol/L (ref 3.5–5.1)
Sodium: 128 mmol/L — ABNORMAL LOW (ref 135–145)

## 2019-01-23 LAB — GLUCOSE, CAPILLARY
GLUCOSE-CAPILLARY: 122 mg/dL — AB (ref 70–99)
Glucose-Capillary: 183 mg/dL — ABNORMAL HIGH (ref 70–99)
Glucose-Capillary: 188 mg/dL — ABNORMAL HIGH (ref 70–99)
Glucose-Capillary: 191 mg/dL — ABNORMAL HIGH (ref 70–99)

## 2019-01-23 LAB — PREPARE RBC (CROSSMATCH)

## 2019-01-23 MED ORDER — SODIUM CHLORIDE 0.9% IV SOLUTION
Freq: Once | INTRAVENOUS | Status: AC
  Administered 2019-01-23: 11:00:00 via INTRAVENOUS

## 2019-01-23 NOTE — Progress Notes (Signed)
Contacted Dr. Dyann Kief that family wanted to change code status to DNR.  He put order in from home and ordered to DC her telemetry.   Second unit of PRBC complete with no adverse reaction. Patient has been sleeping most of day but is easily arouseable .  Husband or daughter has been at beside today.

## 2019-01-23 NOTE — Progress Notes (Signed)
Patients hemoglobin reported to be 6.2 and platlets at 16K. Finding reported to Dr Dyann Kief via Shea Evans

## 2019-01-23 NOTE — Progress Notes (Signed)
PROGRESS NOTE    Ellen Hunt  ZSW:[id number] DOB: 02-20-1951 DOA: 01/20/2019 PCP: Eustaquio Maize, MD (Inactive)     Brief Narrative:   68 y.o. female with medical history significant for High-grade carcinoma of the peritoneum, withmalignant ascites, DM2, CKD3, HTN, presented to the ED with complaints of generalized weakness over the past month progressive, and abnormal lab from her oncologist office.  Patient went to see her oncologist Dr. Delton Coombes today, blood work showed hyperkalemia, hyponatremia.  Also reports progressive abdominal distention since last paracentesis.  Assessment & Plan: 1-hyperkalemia and hyponatremia -In the setting of worsening renal function and dehydration. -Continue Lokelma -Potassium 5.0 -Continue monitoring on telemetry. -Continue IV fluids and as mentioned below will start some lasix.Marland Kitchen  2-metastatic ovarian cancer -Per oncology service patient is too weak and frail for any further chemotherapeutic treatment -Focus on symptomatic management and hospice referral. -CM order in place for that.  3-acute on chronic renal failure -In the setting of dehydration and prerenal azotemia -Avoid nephrotoxic agents-provide fluid resuscitation -Follow renal function trend, especially now that lasix would be added..  4-malignant ascites -Planning to perform palliative paracentesis once platelets are stable. -Will continue treatment with Lasix and albumin.  5-thrombocytopenia -In the setting of malignancy and chemotherapeutic agent usage. -Follow platelets trend -Platelet count down to 16,000; will anticipate platelet transfusion prior to paracentesis on 3/23. -No signs of overt bleeding.  6-diabetes mellitus -Continue sliding scale insulin and follow CBGs -Based on fluctuation will further adjust hypoglycemic regimen and may be started on long-acting.  7-generalized weakness -In the setting of further decline from underlying conditions, including  malignancy and the use of chemotherapy. -PT evaluation has been performed and recommendations made for short-term rehab skilled nursing facility. -Continue supportive care.  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 Disposition Plan: Remains inpatient, continue fluid resuscitation, continue to follow electrolytes and platelets count.  Platelets down to 19,000, discussed with interventional radiologist and will plan for palliative paracentesis to be done on 01/24/19.  In between will continue Lasix and albumin. Transfuse 2 units of PRBC's.  Consultants:   Oncology service  Palliative care  Procedures:   Paracentesis once platelets improved.  Antimicrobials:  Anti-infectives (From admission, onward)   None      Subjective: No chest pain, no nausea, no vomiting.  Patient denies shortness of breath but is requiring oxygen supplementation to maintain O2 sat. Low Hgb and lower platelets count.   Objective: Vitals:   01/23/19 1133 01/23/19 1208 01/23/19 1530 01/23/19 1612  BP: (!) 100/51 (!) 104/58 111/60 116/60  Pulse: (!) 119 (!) 111 96 (!) 105  Resp: (!) 24 (!) 24 (!) 24 (!) 24  Temp: 98.5 F (36.9 C) 98.4 F (36.9 C) 98.5 F (36.9 C) 100.1 F (37.8 C)  TempSrc: Oral Oral Oral Oral  SpO2: 96% 98% 99% 98%  Weight:      Height:        Intake/Output Summary (Last 24 hours) at 01/23/2019 1640 Last data filed at 01/23/2019 1557 Gross per 24 hour  Intake 1035 ml  Output 400 ml  Net 635 ml   Filed Weights   01/21/19 0650 01/22/19 0429 01/23/19 0500  Weight: 84.8 kg 85.8 kg 85.6 kg    Examination: General exam: Alert, awake, oriented x 3; chronically ill in appearance, denying chest pain or shortness of breath; but requiring  high flow oxygen supplementation to maintain O2 sats.  Patient is very  deconditioned and experiencing tachypnea with minimal activity. Respiratory system: No crackles, no wheezing, normal respiratory effort at rest. Cardiovascular system: Sinus tachycardia, no rubs, no gallops, no JVD. Gastrointestinal system: Abdomen is distended, with positive fluid wave, nontender, positive bowel sounds.   Central nervous system: Alert and oriented. No focal neurological deficits. Extremities: No C/C/E, +pedal pulses Skin: No rashes, lesions or ulcers Psychiatry: Judgement and insight appear normal.  Flat affect appreciated.  Data Reviewed: I have personally reviewed following labs and imaging studies  CBC: Recent Labs  Lab 01/20/19 1017 01/20/19 1339 01/21/19 0554 01/22/19 1020 01/23/19 0600  WBC 11.4* 11.5* 9.7 10.5 7.2  NEUTROABS 9.9* 10.2*  --   --   --   HGB 9.9* 9.6* 8.7* 8.1* 6.2*  HCT 31.8* 30.6* 27.6* 25.8* 19.7*  MCV 105.3* 102.7* 103.0* 103.2* 103.7*  PLT 33* 34* 29* 18* 16*   Basic Metabolic Panel: Recent Labs  Lab 01/20/19 1017 01/20/19 1339 01/20/19 2244 01/21/19 0554 01/22/19 1020 01/23/19 0600  NA 127* 126*  --  126* 127* 128*  K 6.6* 6.6* 6.2* 6.1* 5.9* 5.0  CL 97* 96*  --  97* 96* 97*  CO2 19* 19*  --  18* 18* 21*  GLUCOSE 186* 197*  --  193* 154* 139*  BUN 64* 67*  --  68* 74* 76*  CREATININE 2.17* 2.27*  --  2.39* 2.46* 2.40*  CALCIUM 8.3* 8.1*  --  7.9* 7.8* 8.3*  MG 2.6*  --   --   --   --   --    GFR: Estimated Creatinine Clearance: 23.7 mL/min (A) (by C-G formula based on SCr of 2.4 mg/dL (H)).   Liver Function Tests: Recent Labs  Lab 01/20/19 1017 01/20/19 1339  AST 34 28  ALT 21 21  ALKPHOS 97 94  BILITOT 0.7 0.8  PROT 5.3* 5.3*  ALBUMIN 2.2* 2.2*   Coagulation Profile: Recent Labs  Lab 01/20/19 1339  INR 1.2   CBG: Recent Labs  Lab 01/22/19 1203 01/22/19 1654 01/22/19 2046 01/23/19 0759 01/23/19 1143  GLUCAP 174* 215* 243* 122* 183*   Urine analysis:    Component Value Date/Time   COLORURINE  YELLOW 11/28/2018 1430   APPEARANCEUR HAZY (A) 11/28/2018 1430   LABSPEC 1.020 11/28/2018 1430   PHURINE 5.0 11/28/2018 1430   GLUCOSEU NEGATIVE 11/28/2018 1430   HGBUR NEGATIVE 11/28/2018 1430   BILIRUBINUR NEGATIVE 11/28/2018 1430   KETONESUR NEGATIVE 11/28/2018 1430   PROTEINUR NEGATIVE 11/28/2018 1430   NITRITE NEGATIVE 11/28/2018 1430   LEUKOCYTESUR NEGATIVE 11/28/2018 1430    Recent Results (from the past 240 hour(s))  Culture, blood (Routine x 2)     Status: None (Preliminary result)   Collection Time: 01/20/19  1:39 PM  Result Value Ref Range Status   Specimen Description BLOOD DRAWN BY RN SITE NOT SPECIFIED  Final   Special Requests   Final    BOTTLES DRAWN AEROBIC AND ANAEROBIC Blood Culture adequate volume   Culture   Final    NO GROWTH 3 DAYS Performed at [facility], [address], [place], [place] [address]    Report Status PENDING  Incomplete  Culture, blood (Routine x 2)     Status: None (Preliminary result)   Collection Time: 01/20/19  3:42 PM  Result Value Ref Range Status   Specimen Description RIGHT ANTECUBITAL  Final   Special Requests   Final    BOTTLES  DRAWN AEROBIC AND ANAEROBIC Blood Culture adequate volume   Culture   Final    NO GROWTH 3 DAYS Performed at [facility], [address] [place], [place] [address]    Report Status PENDING  Incomplete     Radiology Studies: Dg Chest Port 1 View  Result Date: 01/21/2019 CLINICAL DATA:  Increased oxygen demand EXAM: PORTABLE CHEST 1 VIEW COMPARISON:  01/20/2019, 11/28/2018 FINDINGS: Right-sided central venous port tip over the cavoatrial region. No focal airspace disease or effusion. Stable cardiomediastinal silhouette. No pneumothorax. IMPRESSION: No active disease. Electronically Signed   By: Donavan Foil M.D.   On: 01/21/2019 21:44    Scheduled Meds: . feeding supplement (GLUCERNA SHAKE)  237 mL Oral TID BM  . furosemide  20 mg Intravenous Daily  . insulin aspart  0-5 Units Subcutaneous QHS  .  insulin aspart  0-9 Units Subcutaneous TID WC  . mouth rinse  15 mL Mouth Rinse BID  . sodium bicarbonate  650 mg Oral BID  . sodium zirconium cyclosilicate  5 g Oral Daily   Continuous Infusions: . albumin human 50 g (01/23/19 1438)     LOS: 2 days    Time spent: 30 minutes   Barton Dubois, MD [facility] Pager [phone]  01/23/2019, 4:40 PM

## 2019-01-23 NOTE — Progress Notes (Signed)
Second unit of PRBC infusing with no adverse reaction.  Daughter at bedside.

## 2019-01-24 ENCOUNTER — Inpatient Hospital Stay (HOSPITAL_COMMUNITY): Payer: Medicare Other

## 2019-01-24 ENCOUNTER — Encounter (HOSPITAL_COMMUNITY): Payer: Self-pay

## 2019-01-24 DIAGNOSIS — R6889 Other general symptoms and signs: Secondary | ICD-10-CM

## 2019-01-24 DIAGNOSIS — R0600 Dyspnea, unspecified: Secondary | ICD-10-CM

## 2019-01-24 DIAGNOSIS — Z7189 Other specified counseling: Secondary | ICD-10-CM

## 2019-01-24 DIAGNOSIS — F411 Generalized anxiety disorder: Secondary | ICD-10-CM

## 2019-01-24 DIAGNOSIS — Z515 Encounter for palliative care: Secondary | ICD-10-CM

## 2019-01-24 DIAGNOSIS — C482 Malignant neoplasm of peritoneum, unspecified: Secondary | ICD-10-CM

## 2019-01-24 LAB — TYPE AND SCREEN
ABO/RH(D): O POS
Antibody Screen: NEGATIVE
UNIT DIVISION: 0
Unit division: 0

## 2019-01-24 LAB — CBC
HCT: 25.8 % — ABNORMAL LOW (ref 36.0–46.0)
Hemoglobin: 8.1 g/dL — ABNORMAL LOW (ref 12.0–15.0)
MCH: 29.7 pg (ref 26.0–34.0)
MCHC: 31.4 g/dL (ref 30.0–36.0)
MCV: 94.5 fL (ref 80.0–100.0)
Platelets: 18 10*3/uL — CL (ref 150–400)
RBC: 2.73 MIL/uL — ABNORMAL LOW (ref 3.87–5.11)
RDW: 25.2 % — ABNORMAL HIGH (ref 11.5–15.5)
WBC: 7.6 10*3/uL (ref 4.0–10.5)
nRBC: 0 % (ref 0.0–0.2)

## 2019-01-24 LAB — BASIC METABOLIC PANEL
Anion gap: 13 (ref 5–15)
BUN: 84 mg/dL — AB (ref 8–23)
CO2: 19 mmol/L — ABNORMAL LOW (ref 22–32)
Calcium: 9 mg/dL (ref 8.9–10.3)
Chloride: 98 mmol/L (ref 98–111)
Creatinine, Ser: 2.1 mg/dL — ABNORMAL HIGH (ref 0.44–1.00)
GFR calc Af Amer: 28 mL/min — ABNORMAL LOW (ref >60)
GFR calc non Af Amer: 24 mL/min — ABNORMAL LOW (ref >60)
GLUCOSE: 189 mg/dL — AB (ref 70–99)
Potassium: 4.2 mmol/L (ref 3.5–5.1)
Sodium: 130 mmol/L — ABNORMAL LOW (ref 135–145)

## 2019-01-24 LAB — GLUCOSE, CAPILLARY
Glucose-Capillary: 175 mg/dL — ABNORMAL HIGH (ref 70–99)
Glucose-Capillary: 262 mg/dL — ABNORMAL HIGH (ref 70–99)
Glucose-Capillary: 214 mg/dL — ABNORMAL HIGH (ref 70–99)
Glucose-Capillary: 188 mg/dL — ABNORMAL HIGH (ref 70–99)
Glucose-Capillary: 177 mg/dL — ABNORMAL HIGH (ref 70–99)

## 2019-01-24 LAB — BPAM RBC
Blood Product Expiration Date: 202004072359
Blood Product Expiration Date: 202004142359
ISSUE DATE / TIME: 202003221141
ISSUE DATE / TIME: 202003221550
Unit Type and Rh: 5100
Unit Type and Rh: 9500

## 2019-01-24 MED ORDER — PHENOL 1.4 % MT LIQD
1.0000 | OROMUCOSAL | Status: DC | PRN
  Filled 2019-01-24: qty 177

## 2019-01-24 MED ORDER — ALPRAZOLAM 0.25 MG PO TABS
0.1250 mg | ORAL_TABLET | Freq: Once | ORAL | Status: AC
  Administered 2019-01-24: 0.125 mg via ORAL
  Filled 2019-01-24: qty 1

## 2019-01-24 MED ORDER — SODIUM CHLORIDE 0.9% IV SOLUTION
Freq: Once | INTRAVENOUS | Status: AC
  Administered 2019-01-24: 09:00:00 via INTRAVENOUS

## 2019-01-24 MED ORDER — PANTOPRAZOLE SODIUM 40 MG PO TBEC
40.0000 mg | DELAYED_RELEASE_TABLET | Freq: Two times a day (BID) | ORAL | Status: DC
  Administered 2019-01-24 – 2019-01-25 (×3): 40 mg via ORAL
  Filled 2019-01-24 (×4): qty 1

## 2019-01-24 NOTE — Progress Notes (Signed)
PROGRESS NOTE    Ellen Hunt  ZYS:[id number] DOB: 08-22-1951 DOA: 01/20/2019 PCP: Eustaquio Maize, MD (Inactive)     Brief Narrative:   68 y.o. female with medical history significant for High-grade carcinoma of the peritoneum, withmalignant ascites, DM2, CKD3, HTN, presented to the ED with complaints of generalized weakness over the past month progressive, and abnormal lab from her oncologist office.  Patient went to see her oncologist Dr. Delton Coombes today, blood work showed hyperkalemia, hyponatremia.  Also reports progressive abdominal distention since last paracentesis.  Assessment & Plan: 1-hyperkalemia and hyponatremia -In the setting of worsening renal function and dehydration. -discontinue Lokelma. -Potassium 4.2 -Continue monitoring on telemetry. -Continue albumin and as mentioned below will continue use of lasix.Marland Kitchen  2-metastatic ovarian cancer -Per oncology service patient is too weak and frail for any further chemotherapeutic treatment -Focus on symptomatic management and hospice referral. -CM order in place for that.  3-acute on chronic renal failure -In the setting of dehydration and prerenal azotemia -continue avoiding nephrotoxic agents-provide fluid resuscitation -Cr stable/trending down.   4-malignant ascites -Planning to perform palliative paracentesis later today; see below for details. -Will continue treatment with Lasix and albumin.  5-thrombocytopenia -In the setting of malignancy and chemotherapeutic agent usage. -Follow platelets trend -Platelet count down to 18,000; will transfuse 2 units of platelets and pursuit  paracentesis later today 3/23. -No signs of overt bleeding.  6-diabetes mellitus -Continue sliding scale insulin and follow CBGs -Based on fluctuation will further adjust hypoglycemic regimen and may be started on long-acting.  7-generalized weakness -In the setting of further decline from underlying conditions, including malignancy  and the use of chemotherapy. -PT evaluation has been performed and recommendations made for short-term rehab skilled nursing facility. -Continue supportive care.  8-anemia: chronic disease and associated to chemotherapy -Hgb 8.1 -no overt bleeding -2 units of PRBC's transfused.    DVT prophylaxis: SCDs Code Status: DNR/DNI Family Communication: Husband at bedside. Disposition Plan: Remains inpatient, continue fluid resuscitation, continue to follow electrolytes and platelets count.  Platelets down to 18,000, discussed with interventional radiologist and will plan for palliative paracentesis to be done on 01/24/19.  Continue Lasix and albumin. Transfuse 2 units of Platelets.  Consultants:   Oncology service  Palliative care  Procedures:   Paracentesis once platelets improved.  Antimicrobials:  Anti-infectives (From admission, onward)   None      Subjective: Afebrile, no nausea, no vomiting.  Patient denies chest pain.  Still requiring high flow oxygen nasal cannula supplementation, really weak and deconditioned, having some difficulty urinating while laying on bed and require in and out cath.   Objective: Vitals:   01/24/19 1257 01/24/19 1331 01/24/19 1413 01/24/19 1443  BP: 108/66 119/66 (!) 107/54 98/62  Pulse: (!) 112 (!) 114 (!) 110 (!) 108  Resp: (!) 40 (!) 40 (!) 40 (!) 23  Temp: 98.1 F (36.7 C)   (!) 97.3 F (36.3 C)  TempSrc: Oral   Oral  SpO2: 95% 92% 97% 100%  Weight:      Height:        Intake/Output Summary (Last 24 hours) at 01/24/2019 1523 Last data filed at 01/24/2019 1440 Gross per 24 hour  Intake 1838 ml  Output 600 ml  Net 1238 ml   Filed Weights   01/22/19 0429 01/23/19 0500 01/24/19 0500  Weight: 85.8 kg 85.6 kg 87.7 kg    Examination: General exam: Alert, awake, oriented x 3; patient reports feeling increasing her abdominal distention, no nausea, no chest pain, no  acute distress.  She is afebrile. Respiratory system: Mild tachypnea and  shallow breathing; no wheezing, no frank crackles. Cardiovascular system: Mild sinus tachycardia, no rubs, no gallops, no JVD; soft systolic ejection murmur appreciated on exam. Gastrointestinal system: Abdomen is distended, not tender on palpation, positive bowel sounds, positive fluid wave and ascites appreciated.   Central nervous system: Alert and oriented. No focal neurological deficits. Extremities: No cyanosis or clubbing; trace edema appreciated bilaterally. Skin: No rashes, no open wounds.  Scattered bruises from recent blood draws appreciated. Psychiatry: Judgement and insight appear normal.  Flat affect appreciated.  Data Reviewed: I have personally reviewed following labs and imaging studies  CBC: Recent Labs  Lab 01/20/19 1017 01/20/19 1339 01/21/19 0554 01/22/19 1020 01/23/19 0600 01/24/19 0450  WBC 11.4* 11.5* 9.7 10.5 7.2 7.6  NEUTROABS 9.9* 10.2*  --   --   --   --   HGB 9.9* 9.6* 8.7* 8.1* 6.2* 8.1*  HCT 31.8* 30.6* 27.6* 25.8* 19.7* 25.8*  MCV 105.3* 102.7* 103.0* 103.2* 103.7* 94.5  PLT 33* 34* 29* 18* 16* 18*   Basic Metabolic Panel: Recent Labs  Lab 01/20/19 1017 01/20/19 1339 01/20/19 2244 01/21/19 0554 01/22/19 1020 01/23/19 0600 01/24/19 0450  NA 127* 126*  --  126* 127* 128* 130*  K 6.6* 6.6* 6.2* 6.1* 5.9* 5.0 4.2  CL 97* 96*  --  97* 96* 97* 98  CO2 19* 19*  --  18* 18* 21* 19*  GLUCOSE 186* 197*  --  193* 154* 139* 189*  BUN 64* 67*  --  68* 74* 76* 84*  CREATININE 2.17* 2.27*  --  2.39* 2.46* 2.40* 2.10*  CALCIUM 8.3* 8.1*  --  7.9* 7.8* 8.3* 9.0  MG 2.6*  --   --   --   --   --   --    GFR: Estimated Creatinine Clearance: 27.4 mL/min (A) (by C-G formula based on SCr of 2.1 mg/dL (H)).   Liver Function Tests: Recent Labs  Lab 01/20/19 1017 01/20/19 1339  AST 34 28  ALT 21 21  ALKPHOS 97 94  BILITOT 0.7 0.8  PROT 5.3* 5.3*  ALBUMIN 2.2* 2.2*   Coagulation Profile: Recent Labs  Lab 01/20/19 1339  INR 1.2   CBG: Recent  Labs  Lab 01/23/19 1143 01/23/19 1659 01/23/19 2126 01/24/19 0732 01/24/19 1117  GLUCAP 183* 188* 191* 175* 262*   Urine analysis:    Component Value Date/Time   COLORURINE YELLOW 11/28/2018 1430   APPEARANCEUR HAZY (A) 11/28/2018 1430   LABSPEC 1.020 11/28/2018 1430   PHURINE 5.0 11/28/2018 1430   GLUCOSEU NEGATIVE 11/28/2018 1430   HGBUR NEGATIVE 11/28/2018 1430   BILIRUBINUR NEGATIVE 11/28/2018 1430   KETONESUR NEGATIVE 11/28/2018 1430   PROTEINUR NEGATIVE 11/28/2018 1430   NITRITE NEGATIVE 11/28/2018 1430   LEUKOCYTESUR NEGATIVE 11/28/2018 1430    Recent Results (from the past 240 hour(s))  Culture, blood (Routine x 2)     Status: None (Preliminary result)   Collection Time: 01/20/19  1:39 PM  Result Value Ref Range Status   Specimen Description BLOOD DRAWN BY RN SITE NOT SPECIFIED  Final   Special Requests   Final    BOTTLES DRAWN AEROBIC AND ANAEROBIC Blood Culture adequate volume   Culture   Final    NO GROWTH 4 DAYS Performed at [facility], [address], [place], [place] [address]    Report Status PENDING  Incomplete  Culture, blood (Routine x 2)     Status:  None (Preliminary result)   Collection Time: 01/20/19  3:42 PM  Result Value Ref Range Status   Specimen Description RIGHT ANTECUBITAL  Final   Special Requests   Final    BOTTLES DRAWN AEROBIC AND ANAEROBIC Blood Culture adequate volume   Culture   Final    NO GROWTH 4 DAYS Performed at [facility], [address], [place], [place] [address]    Report Status PENDING  Incomplete     Radiology Studies: US Paracentesis  Result Date: 01/24/2019 INDICATION: Extra ovarian primary peritoneal carcinoma, recurrent ascites EXAM: ULTRASOUND GUIDED THERAPEUTIC PARACENTESIS MEDICATIONS: None. COMPLICATIONS: None immediate. PROCEDURE: Procedure, benefits, and risks of procedure were discussed with patient. Written informed consent for procedure was obtained. Time out protocol followed. Adequate collection of  ascites localized by ultrasound in RIGHT lower quadrant. Skin prepped and draped in usual sterile fashion. Skin and soft tissues anesthetized with 10 mL of 1% lidocaine. 5 [place] Yueh catheter placed into peritoneal cavity. 5.0 L of dark red ascitic fluid aspirated by vacuum bottle suction. Procedure tolerated well by patient without immediate complication. Patient received platelet transfusion, 1 unit before and 1 unit during paracentesis. FINDINGS: As above IMPRESSION: Successful ultrasound-guided paracentesis yielding 5.0 liters of peritoneal fluid. Electronically Signed   By: Lavonia Dana M.D.   On: 01/24/2019 15:06    Scheduled Meds: . feeding supplement (GLUCERNA SHAKE)  237 mL Oral TID BM  . furosemide  20 mg Intravenous Daily  . insulin aspart  0-5 Units Subcutaneous QHS  . insulin aspart  0-9 Units Subcutaneous TID WC  . mouth rinse  15 mL Mouth Rinse BID  . sodium bicarbonate  650 mg Oral BID  . sodium zirconium cyclosilicate  5 g Oral Daily   Continuous Infusions:    LOS: 3 days    Time spent: 30 minutes   Barton Dubois, MD [facility] Pager [phone]  01/24/2019, 3:23 PM

## 2019-01-24 NOTE — Consult Note (Signed)
Consultation Note Date: 01/24/2019   Patient Name: Ellen Hunt  DOB: [birth year]  MRN: [id number]  Age / Sex: 69 y.o., female  PCP: Ellen Maize, MD (Inactive) Referring Physician: Barton Dubois, MD  Reason for Consultation: Establishing goals of care and Terminal Care  HPI/Patient Profile: 68 y.o. female  with past medical history of metastatic ovarian cancer with malignant ascites, HTN, HLD, UTI, GERD, and DM admitted on 01/20/2019 with weakness and abnormal labs at oncology office. Labs revealed hyponatremia, hyperkalemia, and also with progressive abdominal distention since last paracentesis. Per oncology consult, patient is no longer a candidate for further chemotherapy treatment due to poor performance status. Palliative medicine consultation for goals of care/terminal care.   Clinical Assessment and Goals of Care:  I have reviewed medical records, discussed with care team, and initially met with patient and daughter at bedside. This afternoon, f/u with husband Ellen Hunt) while patient was gone for paracentesis.  I introduced Palliative Medicine as specialized medical care for people living with serious illness. It focuses on providing relief from the symptoms and stress of a serious illness.  We discussed a brief life review of the patient. 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 Married with two children (Ellen Hunt and Ellen Hunt). Diagnosed with ovarian cancer in 2017 and s/p surgery and 4 rounds of chemotherapy per family.   Husband shares that in the last 2-3 weeks she has drastically declined functionally. Appetite has been poor.   Discussed events leading up to admission and course of hospitalization including diagnoses and interventions. The natural disease trajectory and expectations at EOL were discussed. Ellen Hunt appropriately tearful throughout conversation. He shares that her oncologists have  prepared them for expectations with diagnosis and disease trajectory, although still not easy to accept.  Ellen Hunt recalls his conversations with Ellen Hunt. He understands [name] is too weak for further oncology interventions. He confirms (after further discussion with patient) decision for DNR code status, understanding this will cause more suffering at EOL. Her living will has been copied and placed into chart.   I attempted to elicit values and goals of care important to the patient and family. The difference between aggressive medical intervention and comfort care was considered. Ellen Hunt shares that physical therapy attempted to work with her this AM but she was short of breath even with minimal movement in the bed. Again explained common disease trajectory of metastatic cancer diagnosis and concern that physically she may no longer tolerate aggressive therapy. Further explained role of comfort focused care in order to allow comfort, quality, and dignity at EOL.   Ellen Hunt is overwhelmed with where to go from here. He does share that Ellen Hunt mentioned she would not return home following this hospitalization. We discussed home hospice services versus inpatient hospice services. Did explain concern with high oxygen requirement and symptom burden and that hospice facility may provide more support for her and family with 24/7 care. Explained use of morphine as needed for dyspnea/pain. Husband shares that she is not currently complaining of pain. He seems  hesitant to give her medication that may cause drowsiness/decreased mental status even though it may help with tachypnea/dyspnea. He does verbalize he does not wish to see her "suffer." Will further discuss with patient/family.   Ellen Hunt is hopeful that paracentesis will help with symptom management and respiratory status. She is currently requiring 13L HFNC. We plan to f/u tomorrow AM (around 0830) to further assess clinical condition and plan of care.  Questions and  concerns were addressed. PMT contact information given.     SUMMARY OF RECOMMENDATIONS    DNR in event of cardiac arrest. Otherwise, continue current plan of care. Platelet transfusion and paracentesis today.   Family understands she is a poor candidate for further oncology interventions. Introduced comfort focused pathway and hospice philosophy and options. PMT provider to f/u with patient/family in AM to further discuss plan of care. Will need further discussion of use of prn medications for comfort and relief of suffering. Today, husband seems hesitant to initiate medications for comfort.   Code Status/Advance Care Planning:  DNR  Symptom Management:   Xanax 0.190m PO x1 prior to paracentesis (Per daughter, patient does not tolerate high doses of anxiety/pain medications).   Palliative Prophylaxis:   Aspiration, Delirium Protocol, Frequent Pain Assessment, Oral Care and Turn Reposition  Psycho-social/Spiritual:   Desire for further Chaplaincy support:yes  Additional Recommendations: Caregiving  Support/Resources, Compassionate Wean Education and Education on Hospice  Prognosis:   Poor prognosis likely less than two weeks with metastatic ovarian cancer, recurrent malignant ascites requiring paracentesis, acute respiratory distress requiring HFNC, declining functional/nutritional status.  Discharge Planning: To Be Determined      Primary Diagnoses: Present on Admission:  Hyperkalemia   I have reviewed the medical record, interviewed the patient and family, and examined the patient. The following aspects are pertinent.  Past Medical History:  Diagnosis Date   Diabetes mellitus without complication ([facility]    on insulin pump   Dysrhythmia    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   Family history of breast cancer    GERD (gastroesophageal reflux disease)    History of blood transfusion    History of bronchitis    History of chemotherapy      History of urinary tract infection    Hyperlipidemia    Hypertension    Low serum vitamin D    Ovarian cancer ([facility] 05/[place]/2016   Shingles    Social History   Socioeconomic History   Marital status: Married    Spouse name: Ellen Hunt   Number of children: 2   Years of education: Not on file   Highest education level: Not on file  Occupational History   Occupation: retired  SScientist, product/process developmentstrain: Not on file   Food insecurity:    Worry: Not on file    Inability: Not on fLexicographerneeds:    Medical: Not on file    Non-medical: Not on file  Tobacco Use   Smoking status: Never Smoker   Smokeless tobacco: Never Used  Substance and Sexual Activity   Alcohol use: No   Drug use: No   Sexual activity: Not on file    Comment: married  Lifestyle   Physical activity:    Days per week: Not on file    Minutes per session: Not on file   Stress: Not on file  Relationships   Social connections:    Talks on phone: Not on file    Gets together: Not on file  Attends religious service: Not on file    Active member of club or organization: Not on file    Attends meetings of clubs or organizations: Not on file    Relationship status: Not on file  Other Topics Concern   Not on file  Social History Narrative   Not on file   Family History  Problem Relation Age of Onset   Lung cancer Mother        smoker; dx in her 45s   Leukemia Father    Diabetes Paternal Grandmother    Heart attack Paternal Grandmother    Diabetes Paternal Grandfather    Breast cancer Paternal Aunt        dx in her 100s-30s   Leukemia Paternal Uncle    Heart attack Maternal Grandfather    Breast cancer Cousin        maternal first cousin   Scheduled Meds:  feeding supplement (GLUCERNA SHAKE)  237 mL Oral TID BM   furosemide  20 mg Intravenous Daily   insulin aspart  0-5 Units Subcutaneous QHS   insulin aspart  0-9 Units Subcutaneous TID WC    mouth rinse  15 mL Mouth Rinse BID   pantoprazole  40 mg Oral BID   sodium bicarbonate  650 mg Oral BID   Continuous Infusions: PRN Meds:.acetaminophen **OR** acetaminophen, ondansetron **OR** ondansetron (ZOFRAN) IV, phenol, polyethylene glycol Medications Prior to Admission:  Prior to Admission medications   Medication Sig Start Date End Date Taking? Authorizing Provider  atorvastatin (LIPITOR) 40 MG tablet Take 1 tablet (40 mg total) by mouth daily. 10/07/18   Ellen Maize, MD  Biotin 1000 MCG tablet Take 1,000 mcg by mouth daily.     [provider]  furosemide (LASIX) 20 MG tablet TAKE 1 TABLET(20 MG) BY MOUTH TWICE DAILY 01/03/19   Lockamy, Randi L, NP-C  glucose blood (ONE TOUCH ULTRA TEST) test strip Use to test blood sugar 6-8 times daily 11/01/18   Philemon Kingdom, MD  HYDROcodone-acetaminophen (NORCO) 10-325 MG tablet Take 1 tablet by mouth every 4 (four) hours as needed. 01/05/19   Lockamy, Randi L, NP-C  insulin lispro (HUMALOG) 100 UNIT/ML injection USE IN INSULIN PUMP AS DIRECTED. MAX DAILY DOSE OF 110 UNITS PER DAY. Patient taking differently: Inject 110 Units into the skin See admin instructions. USE IN INSULIN PUMP AS DIRECTED. MAX DAILY DOSE OF 110 UNITS PER DAY. 09/27/18   Philemon Kingdom, MD  lidocaine-prilocaine (EMLA) cream Apply a quarter size amount to port site 1 hour prior to chemo. Do not rub in. Cover with plastic wrap. 12/09/17   Heath Lark, MD  magnesium oxide (MAG-OX) 400 MG tablet Take 400 mg by mouth 2 (two) times daily. Pt taking once a day    [provider]  Multiple Vitamin (MULTIVITAMIN WITH MINERALS) TABS Take 1 tablet by mouth daily.    [provider]  omeprazole (PRILOSEC) 40 MG capsule Take 1 capsule (40 mg total) by mouth daily. 09/15/18   Derek Jack, MD  polyethylene glycol (MIRALAX / Floria Raveling) packet Take 17 g by mouth daily as needed for mild constipation.     [provider]  prochlorperazine  (COMPAZINE) 10 MG tablet Take 1 tablet (10 mg total) by mouth every 6 (six) hours as needed for nausea or vomiting. 07/16/18   Heath Lark, MD  RUBRACA 300 MG tablet Take 1 tablet (300 mg total) by mouth daily. 01/05/19   Derek Jack, MD  vitamin B-12 (CYANOCOBALAMIN) 1000 MCG tablet  Take 1,000 mcg by mouth daily.    [provider]  vitamin E (VITAMIN E) 400 UNIT capsule Take 400 Units by mouth daily.    [provider]   Allergies  Allergen Reactions   Avandia [Rosiglitazone] Other (See Comments)    Legs swelled   Micronase [Glyburide] Swelling   Actos [Pioglitazone] Other (See Comments)    Edema / leg swelling   Review of Systems  Constitutional: Positive for activity change, appetite change and fatigue.  Respiratory: Positive for shortness of breath.   Neurological: Positive for weakness.    Physical Exam Vitals signs and nursing note reviewed.  Constitutional:      Appearance: She is ill-appearing.  HENT:     Head: Normocephalic and atraumatic.  Cardiovascular:     Rate and Rhythm: Tachycardia present.  Pulmonary:     Effort: Tachypnea present. No accessory muscle usage.     Breath sounds: Decreased breath sounds present.     Comments: Dyspnea at rest. 13L HFNC Abdominal:     General: There is distension.     Tenderness: There is no abdominal tenderness.     Comments: ascites  Skin:    General: Skin is warm and dry.     Coloration: Skin is pale.  Neurological:     Mental Status: She is alert, oriented to person, place, and time and easily aroused.  Psychiatric:        Mood and Affect: Mood is anxious.        Speech: Speech normal.        Cognition and Memory: Cognition normal.    Vital Signs: BP 98/62 (BP Location: Left Arm)    Pulse (!) 108    Temp (!) 97.3 F (36.3 C) (Oral)    Resp (!) 23    Ht 5' 2" (1.575 m)    Wt 87.7 kg    SpO2 100%    BMI 35.36 kg/m  Pain Scale: 0-10   Pain Score: 0-No pain   SpO2: SpO2: 100 % O2  Device:SpO2: 100 % O2 Flow Rate: .O2 Flow Rate (L/min): 13 L/min  IO: Intake/output summary:   Intake/Output Summary (Last 24 hours) at 01/24/2019 1609 Last data filed at 01/24/2019 1440 Gross per 24 hour  Intake 1523 ml  Output 600 ml  Net 923 ml    LBM: Last BM Date: 01/24/19 Baseline Weight: Weight: 81.2 kg(husband states that pt. fluctuates weight a lot) Most recent weight: Weight: 87.7 kg     Palliative Assessment/Data: PPS 30%   Flowsheet Rows     Most Recent Value  Intake Tab  Referral Department  Hospitalist  Unit at Time of Referral  Med/Surg Unit  Palliative Care Primary Diagnosis  Cancer  Date Notified  01/21/19  Palliative Care Type  New Palliative care  Reason for referral  Clarify Goals of Care, End of Life Care Assistance, Counsel Regarding Hospice  Date of Admission  01/20/19  Date first seen by Palliative Care  01/17/19  # of days IP prior to Palliative referral  1  Clinical Assessment  Palliative Performance Scale Score  30%  Psychosocial & Spiritual Assessment  Palliative Care Outcomes  Patient/Family meeting held?  Yes  Who was at the meeting?  patient, daughter, husband  Palliative Care Outcomes  Clarified goals of care, Provided end of life care assistance, Provided psychosocial or spiritual support, Improved non-pain symptom therapy, Counseled regarding hospice, ACP counseling assistance      Time In/Out: 1145-1230, 1415-1450 Time Total: 80  min Greater than 50%  of this time was spent counseling and coordinating care related to the above assessment and plan.  Signed by:  Ihor Dow, DNP, FNP-C Palliative Medicine Team  Phone: [phone] Fax: [phone]   Please contact Palliative Medicine Team phone at [phone] for questions and concerns.  For individual provider: See Shea Evans

## 2019-01-24 NOTE — Progress Notes (Signed)
Paracentesis complete no signs of distress. 5L ascites removed.  

## 2019-01-24 NOTE — Procedures (Signed)
PreOperative Dx: Malignant ascites Postoperative Dx: Malignant ascites Procedure:   US guided paracentesis Radiologist:  Thornton Papas Anesthesia:  10 ml of1% lidocaine Specimen:  5.0 L of dark red ascitic fluid EBL:   < 1 ml Complications: None

## 2019-01-24 NOTE — Progress Notes (Signed)
PT Cancellation Note  Patient Details Name: Ellen Hunt MRN: [id number] DOB: 05-19-1951   Cancelled Treatment:    Reason Eval/Treat Not Completed: Fatigue/lethargy limiting ability to participate.  Family request treatment held secondary to patient slightly lethargic and has difficulty breathing with any movement - RN notified, MD aware.   10:19 AM, 01/24/19 Lonell Grandchild, MPT Physical Therapist with [facility] [phone] office [phone] mobile phone

## 2019-01-25 ENCOUNTER — Ambulatory Visit (HOSPITAL_COMMUNITY): Admission: RE | Admit: 2019-01-25 | Payer: Medicare Other | Source: Ambulatory Visit

## 2019-01-25 ENCOUNTER — Inpatient Hospital Stay (HOSPITAL_COMMUNITY): Payer: Medicare Other

## 2019-01-25 LAB — CBC
HCT: 26.8 % — ABNORMAL LOW (ref 36.0–46.0)
Hemoglobin: 8.5 g/dL — ABNORMAL LOW (ref 12.0–15.0)
MCH: 29.9 pg (ref 26.0–34.0)
MCHC: 31.7 g/dL (ref 30.0–36.0)
MCV: 94.4 fL (ref 80.0–100.0)
PLATELETS: 50 10*3/uL — AB (ref 150–400)
RBC: 2.84 MIL/uL — ABNORMAL LOW (ref 3.87–5.11)
RDW: 24.7 % — ABNORMAL HIGH (ref 11.5–15.5)
WBC: 7.3 10*3/uL (ref 4.0–10.5)
nRBC: 0 % (ref 0.0–0.2)

## 2019-01-25 LAB — BPAM PLATELET PHERESIS
BLOOD PRODUCT EXPIRATION DATE: 202003252359
Blood Product Expiration Date: 202003252359
ISSUE DATE / TIME: 202003231034
ISSUE DATE / TIME: 202003231234
Unit Type and Rh: 6200
Unit Type and Rh: 6200

## 2019-01-25 LAB — CULTURE, BLOOD (ROUTINE X 2)
Culture: NO GROWTH
Culture: NO GROWTH
Special Requests: ADEQUATE
Special Requests: ADEQUATE

## 2019-01-25 LAB — BASIC METABOLIC PANEL
Anion gap: 13 (ref 5–15)
BUN: 79 mg/dL — ABNORMAL HIGH (ref 8–23)
CO2: 20 mmol/L — ABNORMAL LOW (ref 22–32)
Calcium: 9 mg/dL (ref 8.9–10.3)
Chloride: 101 mmol/L (ref 98–111)
Creatinine, Ser: 1.46 mg/dL — ABNORMAL HIGH (ref 0.44–1.00)
GFR calc Af Amer: 43 mL/min — ABNORMAL LOW (ref >60)
GFR, EST NON AFRICAN AMERICAN: 37 mL/min — AB (ref >60)
Glucose, Bld: 178 mg/dL — ABNORMAL HIGH (ref 70–99)
POTASSIUM: 3.7 mmol/L (ref 3.5–5.1)
Sodium: 134 mmol/L — ABNORMAL LOW (ref 135–145)

## 2019-01-25 LAB — GLUCOSE, CAPILLARY
Glucose-Capillary: 150 mg/dL — ABNORMAL HIGH (ref 70–99)
Glucose-Capillary: 259 mg/dL — ABNORMAL HIGH (ref 70–99)
Glucose-Capillary: 182 mg/dL — ABNORMAL HIGH (ref 70–99)
Glucose-Capillary: 156 mg/dL — ABNORMAL HIGH (ref 70–99)

## 2019-01-25 LAB — PREPARE PLATELET PHERESIS
UNIT DIVISION: 0
Unit division: 0

## 2019-01-25 MED ORDER — POLYETHYLENE GLYCOL 3350 17 G PO PACK: 17.0000 g | PACK | Freq: Every day | ORAL | Status: DC | PRN

## 2019-01-25 MED ORDER — HYDROCODONE-ACETAMINOPHEN 10-325 MG PO TABS
1.0000 | ORAL_TABLET | Freq: Four times a day (QID) | ORAL | Status: DC | PRN
  Administered 2019-01-25: 1 via ORAL
  Filled 2019-01-25: qty 1

## 2019-01-25 MED ORDER — DIPHENOXYLATE-ATROPINE 2.5-0.025 MG PO TABS
2.0000 | ORAL_TABLET | Freq: Four times a day (QID) | ORAL | Status: DC | PRN
  Administered 2019-01-25 – 2019-01-26 (×3): 2 via ORAL
  Filled 2019-01-25 (×3): qty 2

## 2019-01-25 MED ORDER — ALPRAZOLAM 0.25 MG PO TABS
0.2500 mg | ORAL_TABLET | Freq: Three times a day (TID) | ORAL | Status: DC | PRN
  Administered 2019-01-26: 0.25 mg via ORAL
  Filled 2019-01-25: qty 1

## 2019-01-25 MED ORDER — FUROSEMIDE 20 MG PO TABS: 20.0000 mg | ORAL_TABLET | Freq: Every day | ORAL | Status: DC

## 2019-01-25 NOTE — TOC Progression Note (Signed)
Transition of Care [place]) - Progression Note    Patient Details  Name: Cidney Kirkwood MRN: [id number] Date of Birth: [birth year]  [facility] CM/SW Contact  Roda Shutters Margretta Sidle, RN Phone Number: 01/25/2019, 4:55 PM  Clinical Narrative:  Pt now planning to go to [place]. Pt scheduled to have pleurex drain placed tomorrow morning.m ay transport afterwards vs following day. At this moment pt will have to be GIP but bed may open before pt ready. CM will cont to follow.      Expected Discharge Plan: [place]    Expected Discharge Plan and Services Expected Discharge Plan: [place] In-house Referral: Hospice / [place] Acute Care Choice: [place] arrangements for the past 2 months: Single Family Home                  Permission Sought/Granted Permission sought to share information with : Facility Art therapist granted to share information with : Yes, Verbal Permission Granted   Permission granted to share info w AGENCY: [facility]

## 2019-01-25 NOTE — Progress Notes (Signed)
PROGRESS NOTE    Chenell Lozon  MPN:[id number] DOB: 1951/10/20 DOA: 01/20/2019 PCP: Eustaquio Maize, MD (Inactive)     Brief Narrative:  68 y.o. female with medical history significant for High-grade carcinoma of the peritoneum, withmalignant ascites, DM2, CKD3, HTN, presented to the ED with complaints of generalized weakness over the past month progressive, and abnormal lab from her oncologist office.  Patient went to see her oncologist Dr. Delton Coombes today, blood work showed hyperkalemia, hyponatremia.  Also reports progressive abdominal distention since last paracentesis.  Assessment & Plan: 1-hyperkalemia and hyponatremia -In the setting of worsening renal function and dehydration. -Potassium 3.7 and sodium 134 -Discontinue telemetry. -Safe to continue low dose of oral Lasix  2-metastatic ovarian cancer -Per oncology service patient is too weak and frail for any further chemotherapeutic treatment -Focus on symptomatic management and hospice referral. -appreciate palliative care assistance and recommendations -Will explore eligibility for inpatient hospice.  3-acute on chronic renal failure -In the setting of dehydration and prerenal azotemia -continue avoiding nephrotoxic agents -discussed with patient and family about oral hydration. -Cr has stabilize and is better than ever; 2.1 currently -will use PO lasix now.   4-malignant ascites -Will continue treatment with Lasix orally.   -Improvement after paracentesis on 3/23; 5 L removed. -Following discussion with radiologist and palliative care service; will pursuit peritoneal Pleurx catheter placement on 3/25 if feasible.  5-thrombocytopenia -In the setting of malignancy and chemotherapeutic agent usage. -Follow platelets trend -Platelet count 50,000 after 2 units of platelets transfused on 3/23 -No signs of overt bleeding.  6-diabetes mellitus -Continue sliding scale insulin and follow CBGs -Based on fluctuation will  further adjust hypoglycemic regimen and may be start long-acting.  7-generalized weakness -In the setting of further decline from underlying conditions, including malignancy and the use of chemotherapy. -PT evaluation has been performed and recommendations made for short-term rehab at skilled nursing facility. Debating eligibility for inpatient hospice.  8-anemia: chronic disease and associated to chemotherapy -Hgb 8.5 -no overt bleeding -2 units of PRBC's transfused.   9-GERD -continue PPI  10-diarrhea -will use as needed lomotil    DVT prophylaxis: SCDs Code Status: DNR/DNI Family Communication: Husband at bedside. Disposition Plan: Remains inpatient, transition Lasix to p.o.  Initiates medications to assist with symptomatic management as per palliative care recommendations.  Hospice will evaluate patient for decision on inpatient hospice options.  Discussed with radiology and planning to place peritoneal Pleurx catheter on 01/04/2019.  Consultants:   Oncology service  Palliative care  Hospice  Interventional radiology  Procedures:   Paracentesis 01/24/19: 5L of dark ascitic fluid removed.   Antimicrobials:  Anti-infectives (From admission, onward)   None      Subjective: Afebrile, no nausea, no vomiting.  Patient denies chest pain.  Still using high flow nasal cannula supplementation.  Tolerated well paracentesis on 01/24/19.  Objective: Vitals:   01/24/19 2127 01/25/19 0506 01/25/19 0757 01/25/19 1402  BP: (!) 112/59 (!) 116/55  (!) 96/53  Pulse: (!) 101 (!) 101  99  Resp: (!) 24 (!) 34  20  Temp: 99.5 F (37.5 C) 98.1 F (36.7 C)  97.8 F (36.6 C)  TempSrc: Oral Oral  Oral  SpO2: 99% 99% 99% 100%  Weight:      Height:        Intake/Output Summary (Last 24 hours) at 01/25/2019 1508 Last data filed at 01/25/2019 1300 Gross per 24 hour  Intake 410 ml  Output 700 ml  Net -290 ml   [employer]  01/22/19 0429 01/23/19 0500 01/24/19 0500  Weight:  85.8 kg 85.6 kg 87.7 kg    Examination: General exam: Alert, awake, oriented x 3, reports breathing better and denies abd pain, nausea and vomiting. Having diarrhea as per nursing reports.Marland Kitchen  Respiratory system: No wheezing, no crackles, no using accessory muscles.  Intermittent episodes of tachypnea appreciated still requiring high flow nasal cannula supplementation. Cardiovascular system: Sinus tachycardia, no murmurs, no gallops, no rubs.   Gastrointestinal system: Abdomen is less distended, soft, nontender, positive bowel sounds.   Central nervous system: Alert and oriented. No focal neurological deficits. Extremities: trace edema, no cyanosis, no clubbing. Skin: No rashes, no open ulcers.  Positive scattered bruises appreciated. Psychiatry: Judgement and insight appear normal. Flat affect.  Data Reviewed: I have personally reviewed following labs and imaging studies  CBC: Recent Labs  Lab 01/20/19 1017 01/20/19 1339 01/21/19 0554 01/22/19 1020 01/23/19 0600 01/24/19 0450 01/25/19 0448  WBC 11.4* 11.5* 9.7 10.5 7.2 7.6 7.3  NEUTROABS 9.9* 10.2*  --   --   --   --   --   HGB 9.9* 9.6* 8.7* 8.1* 6.2* 8.1* 8.5*  HCT 31.8* 30.6* 27.6* 25.8* 19.7* 25.8* 26.8*  MCV 105.3* 102.7* 103.0* 103.2* 103.7* 94.5 94.4  PLT 33* 34* 29* 18* 16* 18* 50*   Basic Metabolic Panel: Recent Labs  Lab 01/20/19 1017  01/21/19 0554 01/22/19 1020 01/23/19 0600 01/24/19 0450 01/25/19 0448  NA 127*   < > 126* 127* 128* 130* 134*  K 6.6*   < > 6.1* 5.9* 5.0 4.2 3.7  CL 97*   < > 97* 96* 97* 98 101  CO2 19*   < > 18* 18* 21* 19* 20*  GLUCOSE 186*   < > 193* 154* 139* 189* 178*  BUN 64*   < > 68* 74* 76* 84* 79*  CREATININE 2.17*   < > 2.39* 2.46* 2.40* 2.10* 1.46*  CALCIUM 8.3*   < > 7.9* 7.8* 8.3* 9.0 9.0  MG 2.6*  --   --   --   --   --   --    < > = values in this interval not displayed.   GFR: Estimated Creatinine Clearance: 39.5 mL/min (A) (by C-G formula based on SCr of 1.46 mg/dL (H)).    Liver Function Tests: Recent Labs  Lab 01/20/19 1017 01/20/19 1339  AST 34 28  ALT 21 21  ALKPHOS 97 94  BILITOT 0.7 0.8  PROT 5.3* 5.3*  ALBUMIN 2.2* 2.2*   Coagulation Profile: Recent Labs  Lab 01/20/19 1339  INR 1.2   CBG: Recent Labs  Lab 01/24/19 1611 01/24/19 2029 01/24/19 2222 01/25/19 0729 01/25/19 1115  GLUCAP 214* 188* 177* 150* 259*   Urine analysis:    Component Value Date/Time   COLORURINE YELLOW 11/28/2018 1430   APPEARANCEUR HAZY (A) 11/28/2018 1430   LABSPEC 1.020 11/28/2018 1430   PHURINE 5.0 11/28/2018 1430   GLUCOSEU NEGATIVE 11/28/2018 1430   HGBUR NEGATIVE 11/28/2018 1430   BILIRUBINUR NEGATIVE 11/28/2018 1430   KETONESUR NEGATIVE 11/28/2018 1430   PROTEINUR NEGATIVE 11/28/2018 1430   NITRITE NEGATIVE 11/28/2018 1430   LEUKOCYTESUR NEGATIVE 11/28/2018 1430    Recent Results (from the past 240 hour(s))  Culture, blood (Routine x 2)     Status: None   Collection Time: 01/20/19  1:39 PM  Result Value Ref Range Status   Specimen Description BLOOD DRAWN BY RN SITE NOT SPECIFIED  Final   Special Requests   Final  BOTTLES DRAWN AEROBIC AND ANAEROBIC Blood Culture adequate volume   Culture   Final    NO GROWTH 5 DAYS Performed at [facility], [address], [place], [place] [address]    Report Status 01/25/2019 FINAL  Final  Culture, blood (Routine x 2)     Status: None   Collection Time: 01/20/19  3:42 PM  Result Value Ref Range Status   Specimen Description RIGHT ANTECUBITAL  Final   Special Requests   Final    BOTTLES DRAWN AEROBIC AND ANAEROBIC Blood Culture adequate volume   Culture   Final    NO GROWTH 5 DAYS Performed at [facility], [address] [place], [place] [address]    Report Status 01/25/2019 FINAL  Final     Radiology Studies: US Paracentesis  Result Date: 01/24/2019 INDICATION: Extra ovarian primary peritoneal carcinoma, recurrent ascites EXAM: ULTRASOUND GUIDED THERAPEUTIC PARACENTESIS MEDICATIONS: None.  COMPLICATIONS: None immediate. PROCEDURE: Procedure, benefits, and risks of procedure were discussed with patient. Written informed consent for procedure was obtained. Time out protocol followed. Adequate collection of ascites localized by ultrasound in RIGHT lower quadrant. Skin prepped and draped in usual sterile fashion. Skin and soft tissues anesthetized with 10 mL of 1% lidocaine. 5 [place] Yueh catheter placed into peritoneal cavity. 5.0 L of dark red ascitic fluid aspirated by vacuum bottle suction. Procedure tolerated well by patient without immediate complication. Patient received platelet transfusion, 1 unit before and 1 unit during paracentesis. FINDINGS: As above IMPRESSION: Successful ultrasound-guided paracentesis yielding 5.0 liters of peritoneal fluid. Electronically Signed   By: Lavonia Dana M.D.   On: 01/24/2019 15:06   Scheduled Meds: . feeding supplement (GLUCERNA SHAKE)  237 mL Oral TID BM  . [START ON 01/21/2019] furosemide  20 mg Oral Daily  . insulin aspart  0-5 Units Subcutaneous QHS  . insulin aspart  0-9 Units Subcutaneous TID WC  . mouth rinse  15 mL Mouth Rinse BID  . pantoprazole  40 mg Oral BID  . sodium bicarbonate  650 mg Oral BID   Continuous Infusions:    LOS: 4 days    Time spent: 30 minutes   Barton Dubois, MD [facility] Pager [phone]  01/25/2019, 3:08 PM

## 2019-01-25 NOTE — Progress Notes (Signed)
Physical Therapy Treatment Patient Details Name: Ellen Hunt MRN: [id number] DOB: 02/09/1951 Today's Date: 01/25/2019    History of Present Illness Ellen Hunt is a 68 y.o. female with medical history significant for High-grade carcinoma of the peritoneum, withmalignant ascites, DM2, CKD3, HTN, presented to the ED with complaints of generalized weakness over the past month progressive, and abnormal lab from her oncologist office.  Patient went to see her oncologist Dr. Delton Coombes today, blood work showed hyperkalemia, hyponatremia.     PT Comments    PT agreeable to therapy.   Pt able to complete 5 reps of UE and LE exercises prior to fatigue.  Agreeable to sit on the edge of the bed but did not want to attempt to stand.    Follow Up Recommendations   hospice per family    Equipment Recommendations    PT would benefit from therapy at hospice to decrease strain on caregivers.     Recommendations for Other Services   none    Precautions / Restrictions  falls    Mobility  Bed Mobility Overal bed mobility: Needs Assistance Bed Mobility: Supine to Sit     Supine to sit: Mod assist     General bed mobility comments: slow labored movement  Transfers Overall transfer level: Needs assistance               General transfer comment: refuesed ; Pt sat bedside and then assisted going to head of the bed      Balance                                            Cognition Arousal/Alertness: Awake/alert Behavior During Therapy: WFL for tasks assessed/performed Overall Cognitive Status: Within Functional Limits for tasks assessed                                        Exercises General Exercises - Upper Extremity Shoulder Flexion: Strengthening;Both;5 reps Shoulder ABduction: Strengthening;Both;5 reps Elbow Flexion: Strengthening;Both;5 reps Elbow Extension: Strengthening;Both;5 reps General Exercises - Lower Extremity Ankle  Circles/Pumps: Strengthening;Both;5 reps Quad Sets: Strengthening;Both;5 reps Gluteal Sets: Strengthening;Both;5 reps Long Arc Quad: 5 reps;Both;Seated Heel Slides: 5 reps;Supine;Both Hip ABduction/ADduction: Both;5 reps;Supine    General Comments        Pertinent Vitals/Pain Faces Pain Scale: Hurts a little bit Pain Location: abdomen Pain Descriptors / Indicators: Tender Pain Intervention(s): Limited activity within patient's tolerance    Home Living Family/patient expects to be discharged to:: Private residence Living Arrangements: Spouse/significant other Available Help at Discharge: Family;Available 24 hours/day Type of Home: House Home Access: Stairs to enter Entrance Stairs-Rails: None Home Layout: One level Home Equipment: Environmental consultant - 4 wheels;Cane - single point;Grab bars - tub/shower Additional Comments: using a borrowed transport chair    Prior Function Level of Independence: Needs assistance  Gait / Transfers Assistance Needed: 2 weeks ago was ambulating with Rollator, since has been mostly pushed around in transport chair by her spouse and limited to a few steps for transfers ADL's / Homemaking Assistance Needed: assisted by spouse     PT Goals (current goals can now be found in the care plan section) Acute Rehab PT Goals Patient Stated Goal: hospice PT Goal Formulation: With patient/family Time For Goal Achievement: 02/04/19    Frequency  PT Plan      Co-evaluation              AM-PAC PT "6 Clicks" Mobility   Outcome Measure                   End of Session               Time: 5284-1324 PT Time Calculation (min) (ACUTE ONLY): 25 min  Charges:  $Therapeutic Exercise: 8-22 mins $Therapeutic Activity: 8-22 mins                       Rayetta Humphrey, PT CLT [phone] 01/25/2019, 11:19 AM

## 2019-01-25 NOTE — Progress Notes (Addendum)
Daily Progress Note   Patient Name: Ellen Hunt       Date: 01/25/2019 DOB: 20-Apr-1951  Age: 68 y.o. MRN#: [id number] Attending Physician: Barton Dubois, MD Primary Care Physician: Eustaquio Maize, MD (Inactive) Admit Date: 01/20/2019  Reason for Consultation/Follow-up: Establishing goals of care and Terminal Care  Subjective: Patient awake, alert, oriented. Complaining of chronic, right left pain. Waiting on RN to give prn norco (home dose and per patient, typically relieves her of this pain). Breathing much improved compared to yesterday. Regular, unlabored respiratory pattern.   GOC:  Husband and daughter Gershon Mussel and Amy) at bedside. Discussed plan of care including diagnoses, interventions, and poor prognosis. Confirmed DNR code status again. Discussed continued aggressive medical pathway versus comfort focused care. Explained disease trajectory of cancer and worsening functional/nutritional status indicating disease progression. Further explained fear of decline within weeks if not days with nutritional status decline leading to recurrent electrolyte imbalances and acute kidney injury. Also ongoing thrombocytopenia secondary to progressive cancer. Patient overwhelmed and continues to state "I don't know."   Husband and daughter speak of wanting her to be "comfortable." Introduced hospice philosophy and options. After further discussion, family agreeable to pursue hospice facility placement. Emphasized role of comfort and plan for symptom management and preventing re-hospitalization. Husband hesitant to use frequent prn morphine with fear she will become "comatose." Educated on EOL symptoms and using these medications only as needed to ensure comfort and relief from symptoms. Explained that  hospice may continue paracentesis to help manage symptoms but concern that this intervention is not as effective as in the past with recurrent ascites (She has been having paracentesis for >1 year about every 2 weeks). Explained discontinuation of medications and interventions not aimed at comfort including frequent lab draws. Discussed comfort feeds.   Hard Choices copy given for review and encouraged husband/daughter to read. Emotional support provided. Answered questions and concerns. Family has PMT contact information.  Length of Stay: 4  Current Medications: Scheduled Meds:   feeding supplement (GLUCERNA SHAKE)  237 mL Oral TID BM   furosemide  20 mg Intravenous Daily   insulin aspart  0-5 Units Subcutaneous QHS   insulin aspart  0-9 Units Subcutaneous TID WC   mouth rinse  15 mL Mouth Rinse BID   pantoprazole  40 mg Oral BID  sodium bicarbonate  650 mg Oral BID    Continuous Infusions:  PRN Meds: acetaminophen **OR** acetaminophen, HYDROcodone-acetaminophen, ondansetron **OR** ondansetron (ZOFRAN) IV, phenol, polyethylene glycol  Physical Exam Vitals signs and nursing note reviewed.  Constitutional:      General: She is awake.     Appearance: She is ill-appearing.  HENT:     Head: Normocephalic and atraumatic.  Cardiovascular:     Rate and Rhythm: Normal rate.  Pulmonary:     Effort: No tachypnea, accessory muscle usage or respiratory distress.  Abdominal:     Tenderness: There is no abdominal tenderness.  Skin:    General: Skin is warm and dry.     Coloration: Skin is pale.  Neurological:     Mental Status: She is alert and oriented to person, place, and time.  Psychiatric:        Speech: Speech normal.        Behavior: Behavior normal.        Cognition and Memory: Cognition normal.     Comments: Irritable/overwhelmed            Vital Signs: BP (!) 116/55 (BP Location: Left Arm)    Pulse (!) 101    Temp 98.1 F (36.7 C) (Oral)    Resp (!) 34    Ht 5\' 2"   (1.575 m)    Wt 87.7 kg    SpO2 99%    BMI 35.36 kg/m  SpO2: SpO2: 99 % O2 Device: O2 Device: High Flow Nasal Cannula O2 Flow Rate: O2 Flow Rate (L/min): 13 L/min  Intake/output summary:   Intake/Output Summary (Last 24 hours) at 01/25/2019 0842 Last data filed at 01/25/2019 0506 Gross per 24 hour  Intake 1378 ml  Output 1300 ml  Net 78 ml   LBM: Last BM Date: 01/24/19 Baseline Weight: Weight: 81.2 kg(husband states that pt. fluctuates weight a lot) Most recent weight: Weight: 87.7 kg       Palliative Assessment/Data: PPS 30%   Flowsheet Rows     Most Recent Value  Intake Tab  Referral Department  Hospitalist  Unit at Time of Referral  Med/Surg Unit  Palliative Care Primary Diagnosis  Cancer  Date Notified  01/21/19  Palliative Care Type  New Palliative care  Reason for referral  Clarify Goals of Care, End of Life Care Assistance, Counsel Regarding Hospice  Date of Admission  01/20/19  Date first seen by Palliative Care  01/17/19  # of days IP prior to Palliative referral  1  Clinical Assessment  Palliative Performance Scale Score  30%  Psychosocial & Spiritual Assessment  Palliative Care Outcomes  Patient/Family meeting held?  Yes  Who was at the meeting?  patient, daughter, husband  Palliative Care Outcomes  Clarified goals of care, Provided end of life care assistance, Provided psychosocial or spiritual support, Improved non-pain symptom therapy, Counseled regarding hospice, ACP counseling assistance      Patient Active Problem List   Diagnosis Date Noted   Palliative care by specialist    Peritoneal carcinoma ([place])    Increased oxygen demand    Anxiety state    Dyspnea    Malignant neoplasm of ovary ([place])    Diabetes mellitus due to underlying condition with diabetic nephropathy, with long-term current use of insulin (H[facility])    Ascites, malignant 11/28/2018   Hyperkalemia 10/08/2018   Severe dehydration 10/08/2018   Hypermagnesemia 10/08/2018    Hyperglycemia 10/08/2018   Insulin pump in place 10/08/2018   Acute renal failure superimposed  on stage 3 chronic kidney disease ([place]) 10/08/2018   Malignant ascites 07/02/2018   Drug-induced hypotension 06/11/2018   CKD (chronic kidney disease), stage III ([place]) 03/08/2018   Hypomagnesemia 02/08/2018   Peripheral neuropathy due to chemotherapy ([place]) 11/10/2017   Elevated liver enzymes 08/25/2017   Pancytopenia, acquired ([place]) 08/14/2017   E-coli UTI 08/05/2017   Thrombocytopenia ([place]) 08/04/2017   Sepsis secondary to UTI ([place]) 08/04/2017   AKI (acute kidney injury) ([place]) 08/04/2017   Goals of care, counseling/discussion 05/14/2017   Edema leg 12/18/2016   Influenza with pneumonia 11/20/2016   Sepsis ([place]) 11/20/2016   Antineoplastic chemotherapy induced pancytopenia ([place]) 11/20/2016   HCAP (healthcare-associated pneumonia) 11/19/2016   Infection of urinary tract 10/14/2016   B12 deficiency 08/31/2016   Anemia due to antineoplastic chemotherapy 08/09/2016   Chemotherapy-induced neuropathy ([place]) 08/07/2016   Glaucoma 08/07/2016   Genetic testing 06/27/2016   Family history of breast cancer    Shingles 05/09/2016   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 female  with past medical history of metastatic ovarian cancer with malignant ascites, HTN, HLD, UTI, GERD, and DM admitted on 01/20/2019 with weakness and abnormal labs at oncology office. Labs revealed hyponatremia, hyperkalemia, and also with progressive  abdominal distention since last paracentesis. Per oncology consult, patient is no longer a candidate for further chemotherapy treatment due to poor performance status. Palliative medicine consultation for goals of care/terminal care.   Assessment: Metastatic ovarian cancer Malignant recurrent ascites Acute on chronic renal failure Hyperkalemia Hyponatremia Thrombocytopenia Anemia Diabetes mellitus Generalized weakness and deconditioning  Recommendations/Plan:  After further GOC discussion and explanation of hospice philosophy/options, patient/family decide on hospice facility placement understanding hospice philosophy and poor prognosis of possibly weeks with progressive ovarian cancer and declining functional/nutritional status.  Continue current plan of care per attending but transition to comfort focused care and hospice facility on discharge. SW notified.   Symptom management:   Norco 10-325mg , 1 tablet q6h prn pain  Xanax 0.25mg  PO TID prn anxiety  Lomotil 2tab PO QID prn loose stools  Husband hesitant to initiate morphine prn with fear she will become "comatose." Will need further education at hospice facility if symptoms worsen.   Paracentesis prn symptomatic ascites per hospice attending  Hard Choices booklet left for review and encouraged family to further read for more information on palliative/hospice philosophy and EOL s/s.    Code Status: DNR   Code Status Orders  (From admission, onward)         Start     Ordered   01/23/19 1853  Do not attempt resuscitation (DNR)  Continuous    Question Answer Comment  In the event of cardiac or respiratory ARREST Do not call a code blue   In the event of cardiac or respiratory ARREST Do not perform Intubation, CPR, defibrillation or ACLS   In the event of cardiac or respiratory ARREST Use medication by any route, position, wound care, and other measures to relive pain and suffering. May use oxygen, suction and manual  treatment of airway obstruction as needed for comfort.  01/23/19 1853        Code Status History    Date Active Date Inactive Code Status Order ID Comments User Context   01/20/2019 1659 01/23/2019 1853 Full Code [id number]  Bethena Roys, MD ED   11/28/2018 2206 11/29/2018 2110 Full Code [id number]  Bethena Roys, MD Inpatient   10/08/2018 1512 10/12/2018 0243 Full Code [id number]  Murlean Iba, MD Inpatient   08/03/2017 1123 08/05/2017 1721 Full Code [id number]  Samuella Cota, MD Inpatient   11/20/2016 0035 11/22/2016 1623 Full Code [id number]  Karmen Bongo, MD Inpatient   10/14/2016 1303 10/15/2016 1458 Full Code [id number]  Lahoma Crocker, MD Inpatient    Advance Directive Documentation     Most Recent Value  Type of Advance Directive  Healthcare Power of Attorney, Living will  Pre-existing out of facility DNR order (yellow form or pink MOST form)  --  "MOST" Form in Place?  --       Prognosis:  Poor prognosis possibly weeks with metastatic ovarian cancer, recurrent malignant ascites, acute respiratory distress requiring HFNC, anemia/thrombocytopenia, declining functional/nutritional status.  Discharge Planning:  Home with Hospice  Care plan was discussed with patient, husband, daughter, RN, Dr Dyann Kief  Thank you for allowing the Palliative Medicine Team to assist in the care of this patient.   Time In: 0830- 1000- Time Out: 0930 1020 Total Time 80 Prolonged Time Billed  yes      Greater than 50%  of this time was spent counseling and coordinating care related to the above assessment and plan.  Ihor Dow, DNP, FNP-C Palliative Medicine Team  Phone: [phone] Fax: [phone]  Please contact Palliative Medicine Team phone at [phone] for questions and concerns.

## 2019-01-26 ENCOUNTER — Ambulatory Visit (HOSPITAL_COMMUNITY)
Admission: RE | Admit: 2019-01-26 | Discharge: 2019-01-26 | Disposition: A | Discharge status: Home / Self Care | Payer: Medicare Other | Source: Ambulatory Visit | Attending: Internal Medicine | Admitting: Internal Medicine

## 2019-01-26 ENCOUNTER — Ambulatory Visit (HOSPITAL_COMMUNITY): Admit: 2019-01-26 | Payer: Medicare Other

## 2019-01-26 ENCOUNTER — Encounter (HOSPITAL_COMMUNITY): Payer: Self-pay | Admitting: Interventional Radiology

## 2019-01-26 ENCOUNTER — Inpatient Hospital Stay (HOSPITAL_COMMUNITY)
Admit: 2019-01-26 | Discharge: 2019-02-02 | DRG: 375 | Disposition: E | Source: Ambulatory Visit | Attending: Internal Medicine | Admitting: Internal Medicine

## 2019-01-26 DIAGNOSIS — Z9641 Presence of insulin pump (external) (internal): Secondary | ICD-10-CM | POA: Diagnosis present

## 2019-01-26 DIAGNOSIS — R509 Fever, unspecified: Secondary | ICD-10-CM | POA: Diagnosis not present

## 2019-01-26 DIAGNOSIS — D630 Anemia in neoplastic disease: Secondary | ICD-10-CM | POA: Diagnosis present

## 2019-01-26 DIAGNOSIS — E871 Hypo-osmolality and hyponatremia: Secondary | ICD-10-CM | POA: Diagnosis present

## 2019-01-26 DIAGNOSIS — Z794 Long term (current) use of insulin: Secondary | ICD-10-CM

## 2019-01-26 DIAGNOSIS — E785 Hyperlipidemia, unspecified: Secondary | ICD-10-CM | POA: Diagnosis present

## 2019-01-26 DIAGNOSIS — Z515 Encounter for palliative care: Secondary | ICD-10-CM | POA: Diagnosis present

## 2019-01-26 DIAGNOSIS — R180 Malignant ascites: Secondary | ICD-10-CM | POA: Diagnosis present

## 2019-01-26 DIAGNOSIS — D696 Thrombocytopenia, unspecified: Secondary | ICD-10-CM | POA: Diagnosis present

## 2019-01-26 DIAGNOSIS — R197 Diarrhea, unspecified: Secondary | ICD-10-CM | POA: Diagnosis present

## 2019-01-26 DIAGNOSIS — E869 Volume depletion, unspecified: Secondary | ICD-10-CM | POA: Diagnosis present

## 2019-01-26 DIAGNOSIS — E875 Hyperkalemia: Principal | ICD-10-CM

## 2019-01-26 DIAGNOSIS — Z8589 Personal history of malignant neoplasm of other organs and systems: Secondary | ICD-10-CM

## 2019-01-26 DIAGNOSIS — Z803 Family history of malignant neoplasm of breast: Secondary | ICD-10-CM | POA: Diagnosis not present

## 2019-01-26 DIAGNOSIS — Z90710 Acquired absence of both cervix and uterus: Secondary | ICD-10-CM

## 2019-01-26 DIAGNOSIS — C482 Malignant neoplasm of peritoneum, unspecified: Secondary | ICD-10-CM | POA: Insufficient documentation

## 2019-01-26 DIAGNOSIS — E0821 Diabetes mellitus due to underlying condition with diabetic nephropathy: Secondary | ICD-10-CM

## 2019-01-26 DIAGNOSIS — I129 Hypertensive chronic kidney disease with stage 1 through stage 4 chronic kidney disease, or unspecified chronic kidney disease: Secondary | ICD-10-CM | POA: Diagnosis present

## 2019-01-26 DIAGNOSIS — Z981 Arthrodesis status: Secondary | ICD-10-CM

## 2019-01-26 DIAGNOSIS — R627 Adult failure to thrive: Secondary | ICD-10-CM | POA: Diagnosis present

## 2019-01-26 DIAGNOSIS — G620 Drug-induced polyneuropathy: Secondary | ICD-10-CM | POA: Diagnosis not present

## 2019-01-26 DIAGNOSIS — C786 Secondary malignant neoplasm of retroperitoneum and peritoneum: Secondary | ICD-10-CM | POA: Diagnosis present

## 2019-01-26 DIAGNOSIS — N183 Chronic kidney disease, stage 3 unspecified: Secondary | ICD-10-CM | POA: Diagnosis present

## 2019-01-26 DIAGNOSIS — D638 Anemia in other chronic diseases classified elsewhere: Secondary | ICD-10-CM | POA: Diagnosis present

## 2019-01-26 DIAGNOSIS — Z90722 Acquired absence of ovaries, bilateral: Secondary | ICD-10-CM

## 2019-01-26 DIAGNOSIS — Z8249 Family history of ischemic heart disease and other diseases of the circulatory system: Secondary | ICD-10-CM

## 2019-01-26 DIAGNOSIS — T451X5A Adverse effect of antineoplastic and immunosuppressive drugs, initial encounter: Secondary | ICD-10-CM | POA: Diagnosis present

## 2019-01-26 DIAGNOSIS — Z79899 Other long term (current) drug therapy: Secondary | ICD-10-CM

## 2019-01-26 DIAGNOSIS — E1122 Type 2 diabetes mellitus with diabetic chronic kidney disease: Secondary | ICD-10-CM | POA: Diagnosis present

## 2019-01-26 DIAGNOSIS — D61810 Antineoplastic chemotherapy induced pancytopenia: Secondary | ICD-10-CM | POA: Diagnosis present

## 2019-01-26 DIAGNOSIS — Z923 Personal history of irradiation: Secondary | ICD-10-CM

## 2019-01-26 DIAGNOSIS — Z9079 Acquired absence of other genital organ(s): Secondary | ICD-10-CM

## 2019-01-26 DIAGNOSIS — Z8543 Personal history of malignant neoplasm of ovary: Secondary | ICD-10-CM | POA: Diagnosis not present

## 2019-01-26 DIAGNOSIS — Z833 Family history of diabetes mellitus: Secondary | ICD-10-CM

## 2019-01-26 DIAGNOSIS — N179 Acute kidney failure, unspecified: Secondary | ICD-10-CM | POA: Diagnosis present

## 2019-01-26 DIAGNOSIS — Z66 Do not resuscitate: Secondary | ICD-10-CM | POA: Diagnosis present

## 2019-01-26 DIAGNOSIS — Z888 Allergy status to other drugs, medicaments and biological substances status: Secondary | ICD-10-CM

## 2019-01-26 DIAGNOSIS — C481 Malignant neoplasm of specified parts of peritoneum: Secondary | ICD-10-CM | POA: Diagnosis not present

## 2019-01-26 HISTORY — PX: IR PERC TUN PERIT CATH WO PORT S&I /IMAG: IMG2327

## 2019-01-26 LAB — CBC
HCT: 29.3 % — ABNORMAL LOW (ref 36.0–46.0)
Hemoglobin: 9.4 g/dL — ABNORMAL LOW (ref 12.0–15.0)
MCH: 30.1 pg (ref 26.0–34.0)
MCHC: 32.1 g/dL (ref 30.0–36.0)
MCV: 93.9 fL (ref 80.0–100.0)
NRBC: 0 % (ref 0.0–0.2)
Platelets: 39 10*3/uL — ABNORMAL LOW (ref 150–400)
RBC: 3.12 MIL/uL — ABNORMAL LOW (ref 3.87–5.11)
RDW: 23.5 % — ABNORMAL HIGH (ref 11.5–15.5)
WBC: 8.6 10*3/uL (ref 4.0–10.5)

## 2019-01-26 LAB — GLUCOSE, CAPILLARY
GLUCOSE-CAPILLARY: 178 mg/dL — AB (ref 70–99)
GLUCOSE-CAPILLARY: 171 mg/dL — AB (ref 70–99)

## 2019-01-26 LAB — PROTIME-INR
INR: 1.4 — ABNORMAL HIGH (ref 0.8–1.2)
Prothrombin Time: 16.7 seconds — ABNORMAL HIGH (ref 11.4–15.2)

## 2019-01-26 MED ORDER — GLYCOPYRROLATE 1 MG PO TABS: 1.0000 mg | ORAL_TABLET | ORAL | Status: DC | PRN

## 2019-01-26 MED ORDER — CEFAZOLIN SODIUM-DEXTROSE 2-4 GM/100ML-% IV SOLN
INTRAVENOUS | Status: AC
  Administered 2019-01-26: 2000 mg
  Filled 2019-01-26: qty 100

## 2019-01-26 MED ORDER — LORAZEPAM 2 MG/ML PO CONC: 1.0000 mg | ORAL | Status: DC | PRN

## 2019-01-26 MED ORDER — GLYCOPYRROLATE 0.2 MG/ML IJ SOLN: 0.2000 mg | INTRAMUSCULAR | Status: DC | PRN

## 2019-01-26 MED ORDER — HALOPERIDOL 0.5 MG PO TABS: 0.5000 mg | ORAL_TABLET | ORAL | Status: DC | PRN

## 2019-01-26 MED ORDER — ONDANSETRON 4 MG PO TBDP: 4.0000 mg | ORAL_TABLET | Freq: Four times a day (QID) | ORAL | Status: DC | PRN

## 2019-01-26 MED ORDER — MIDAZOLAM HCL 2 MG/2ML IJ SOLN
INTRAMUSCULAR | Status: AC
  Filled 2019-01-26: qty 2

## 2019-01-26 MED ORDER — HYDROCODONE-ACETAMINOPHEN 10-325 MG PO TABS
1.0000 | ORAL_TABLET | ORAL | Status: DC | PRN
  Administered 2019-01-27: 1 via ORAL
  Filled 2019-01-26: qty 1

## 2019-01-26 MED ORDER — HALOPERIDOL LACTATE 5 MG/ML IJ SOLN: 0.5000 mg | INTRAMUSCULAR | Status: DC | PRN

## 2019-01-26 MED ORDER — POLYVINYL ALCOHOL 1.4 % OP SOLN: 1.0000 [drp] | Freq: Four times a day (QID) | OPHTHALMIC | Status: DC | PRN

## 2019-01-26 MED ORDER — FENTANYL CITRATE (PF) 100 MCG/2ML IJ SOLN
INTRAMUSCULAR | Status: AC
  Filled 2019-01-26: qty 2

## 2019-01-26 MED ORDER — ALPRAZOLAM 0.25 MG PO TABS
0.2500 mg | ORAL_TABLET | Freq: Four times a day (QID) | ORAL | Status: DC | PRN
  Administered 2019-01-26: 0.25 mg via ORAL
  Filled 2019-01-26: qty 1

## 2019-01-26 MED ORDER — MIDAZOLAM HCL 2 MG/2ML IJ SOLN
INTRAMUSCULAR | Status: AC | PRN
  Administered 2019-01-26: 0.5 mg via INTRAVENOUS

## 2019-01-26 MED ORDER — LORAZEPAM 2 MG/ML IJ SOLN: 1.0000 mg | INTRAMUSCULAR | Status: DC | PRN

## 2019-01-26 MED ORDER — ACETAMINOPHEN 325 MG PO TABS: 650.0000 mg | ORAL_TABLET | Freq: Four times a day (QID) | ORAL | Status: DC | PRN

## 2019-01-26 MED ORDER — ONDANSETRON HCL 4 MG/2ML IJ SOLN: 4.0000 mg | Freq: Four times a day (QID) | INTRAMUSCULAR | Status: DC | PRN

## 2019-01-26 MED ORDER — LORAZEPAM 1 MG PO TABS
1.0000 mg | ORAL_TABLET | ORAL | Status: DC | PRN
  Administered 2019-01-27: 1 mg via ORAL
  Filled 2019-01-26 (×2): qty 1

## 2019-01-26 MED ORDER — ACETAMINOPHEN 650 MG RE SUPP
650.0000 mg | Freq: Four times a day (QID) | RECTAL | Status: DC | PRN
  Administered 2019-01-27 – 2019-01-28 (×4): 650 mg via RECTAL
  Filled 2019-01-26 (×4): qty 1

## 2019-01-26 MED ORDER — BIOTENE DRY MOUTH MT LIQD: 15.0000 mL | OROMUCOSAL | Status: DC | PRN

## 2019-01-26 MED ORDER — DIPHENOXYLATE-ATROPINE 2.5-0.025 MG PO TABS
1.0000 | ORAL_TABLET | Freq: Four times a day (QID) | ORAL | Status: DC | PRN
  Administered 2019-01-26 – 2019-01-27 (×2): 1 via ORAL
  Filled 2019-01-26 (×2): qty 1

## 2019-01-26 MED ORDER — HALOPERIDOL LACTATE 2 MG/ML PO CONC
0.5000 mg | ORAL | Status: DC | PRN
  Filled 2019-01-26: qty 0.3

## 2019-01-26 NOTE — Sedation Documentation (Signed)
Report given to [place], RN [facility]). Also made nurse aware I will be sending a neon yellow packet of information that needs to go to case management at [facility] to fill out for patient and family. Per brittany, she will call patients husband to providee update on patient.

## 2019-01-26 NOTE — Care Management (Signed)
[facility] does not have bed available today. Pt will be made GIP this afternoon.

## 2019-01-26 NOTE — Consult Note (Signed)
Chief Complaint: Patient was seen in consultation today for peritoneal tunneled PleurX catheter placement at the request of [place]  Referring Physician(s): [place]  Supervising Physician: Sandi Mariscal  Patient Status: [facility] IP  History of Present Illness: Ellen Hunt is a 68 y.o. 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   Family history of breast cancer    GERD (gastroesophageal reflux disease)    History of blood transfusion    History of bronchitis    History of chemotherapy    History of urinary tract infection    Hyperlipidemia    Hypertension    Low serum vitamin D    Ovarian cancer ([place]) 05/09/2016   Shingles     Past Surgical History:  Procedure Laterality Date   ABDOMINAL HYSTERECTOMY  10/14/2016   CESAREAN SECTION     DEBULKING N/A 10/14/2016   Procedure: DEBULKING;  Surgeon: Everitt Amber, MD;  Location: [facility];  Service: Gynecology;  Laterality: N/A;   IR PARACENTESIS  06/01/2017   LAPAROTOMY WITH STAGING N/A 10/14/2016   Procedure: LAPAROTOMY WITH OMENTECTOMY AND TUMOR DEBULGING;  Surgeon: Everitt Amber, MD;  Location: [facility];  Service: Gynecology;  Laterality: N/A;   LUMBAR FUSION  08/21/15   L3-L4 Dr. Timmothy Euler   OMENTECTOMY N/A 10/14/2016   Procedure: OMENTECTOMY;  Surgeon: Everitt Amber, MD;  Location: [facility];  Service: Gynecology;  Laterality: N/A;   ROBOTIC ASSISTED TOTAL HYSTERECTOMY WITH BILATERAL SALPINGO  OOPHERECTOMY Bilateral 10/14/2016   Procedure: XI ROBOTIC ASSISTED TOTAL LAPARSCOPIC  HYSTERECTOMY WITH BILATERAL SALPINGO OOPHORECTOMY;  Surgeon: Everitt Amber, MD;  Location: [facility];  Service: Gynecology;  Laterality: Bilateral;    Allergies: Avandia [rosiglitazone]; Micronase [glyburide]; and Actos [pioglitazone]  Medications: Prior to Admission medications   Medication Sig Start Date End Date Taking? Authorizing Provider  atorvastatin (LIPITOR) 40 MG tablet Take 1 tablet (40 mg total) by mouth daily. 10/07/18   Eustaquio Maize, MD  Biotin 1000 MCG tablet Take 1,000 mcg by mouth daily.     [provider]  furosemide (LASIX) 20 MG tablet TAKE 1 TABLET(20 MG) BY MOUTH TWICE DAILY 01/03/19   Lockamy, Randi L, NP-C  glucose blood (ONE TOUCH ULTRA TEST) test strip Use to test blood sugar 6-8 times daily 11/01/18   Philemon Kingdom, MD  HYDROcodone-acetaminophen (NORCO) 10-325 MG tablet Take 1 tablet by mouth every 4 (four) hours as needed. 01/05/19   Lockamy, Randi L, NP-C  insulin lispro (HUMALOG) 100 UNIT/ML injection USE IN INSULIN PUMP AS DIRECTED. MAX DAILY DOSE OF 110 UNITS PER DAY. Patient taking differently: Inject 110 Units into the skin See admin instructions. USE IN INSULIN PUMP AS DIRECTED. MAX DAILY DOSE OF 110 UNITS PER DAY. 09/27/18   Philemon Kingdom, MD  lidocaine-prilocaine (EMLA) cream Apply a quarter size amount to port site 1 hour prior to chemo. Do not rub in. Cover with plastic wrap. 12/09/17   Heath Lark, MD  magnesium oxide (MAG-OX) 400 MG tablet Take 400 mg by mouth 2 (two) times daily. Pt  taking once a day    [provider]  Multiple Vitamin (MULTIVITAMIN WITH MINERALS) TABS Take 1 tablet by mouth daily.    [provider]  omeprazole (PRILOSEC) 40 MG capsule Take 1 capsule (40 mg total) by mouth daily. 09/15/18   Derek Jack, MD  polyethylene glycol (MIRALAX / Floria Raveling) packet Take 17 g by mouth daily as needed for mild constipation.      [provider]  prochlorperazine (COMPAZINE) 10 MG tablet Take 1 tablet (10 mg total) by mouth every 6 (six) hours as needed for nausea or vomiting. 07/16/18   Heath Lark, MD  RUBRACA 300 MG tablet Take 1 tablet (300 mg total) by mouth daily. 01/05/19   Derek Jack, MD  vitamin B-12 (CYANOCOBALAMIN) 1000 MCG tablet Take 1,000 mcg by mouth daily.    [provider]  vitamin E (VITAMIN E) 400 UNIT capsule Take 400 Units by mouth daily.    [provider]     Family History  Problem Relation Age of Onset   Lung cancer Mother        smoker; dx in her 20s   Leukemia Father    Diabetes Paternal Grandmother    Heart attack Paternal Grandmother    Diabetes Paternal Grandfather    Breast cancer Paternal Aunt        dx in her 17s-30s   Leukemia Paternal Uncle    Heart attack Maternal Grandfather    Breast cancer Cousin        maternal first cousin    Social History   Socioeconomic History   Marital status: Married    Spouse name: [name]   Number of children: 2   Years of education: Not on file   Highest education level: Not on file  Occupational History   Occupation: retired  Scientist, product/process development strain: Not on file   Food insecurity:    Worry: Not on file    Inability: Not on Lexicographer needs:    Medical: Not on file    Non-medical: Not on file  Tobacco Use   Smoking status: Never Smoker   Smokeless tobacco: Never Used  Substance and Sexual Activity   Alcohol use: No   Drug use: No   Sexual activity: Not on file    Comment: married  Lifestyle   Physical activity:    Days per week: Not on file    Minutes per session: Not on file   Stress: Not on file  Relationships   Social connections:    Talks on phone: Not on file    Gets together: Not on file    Attends religious service: Not on file    Active member of club or organization: Not on file    Attends meetings of clubs or organizations:  Not on file    Relationship status: Not on file  Other Topics Concern   Not on file  Social History Narrative   Not on file    Review of Systems: A 12 point ROS discussed and pertinent positives are indicated in the HPI above.  All other systems are negative.  Review of Systems  Constitutional: Positive for activity change and fatigue. Negative for fever.  Respiratory: Positive for shortness of breath.   Cardiovascular: Positive for leg swelling. Negative for chest pain.  Gastrointestinal: Positive for abdominal distention, abdominal pain and nausea.  Neurological: Positive for weakness.  Psychiatric/Behavioral: Positive for decreased concentration.    Vital Signs: There  were no vitals taken for this visit.  Physical Exam Vitals signs reviewed.  Cardiovascular:     Rate and Rhythm: Normal rate.  Pulmonary:     Effort: Pulmonary effort is normal.     Breath sounds: Wheezing present.  Abdominal:     General: There is distension.     Tenderness: There is abdominal tenderness.  Musculoskeletal: Normal range of motion.  Skin:    General: Skin is warm and dry.  Neurological:     Mental Status: She is alert and oriented to person, place, and time.  Psychiatric:     Comments: Although pt is alert and oriented So weak - she is unable to sign consent Consented husband Marcello Moores vial phone     Imaging: Dg Chest 2 View  Result Date: 01/20/2019 CLINICAL DATA:  68 year old female with a history of shortness of breath EXAM: CHEST - 2 VIEW COMPARISON:  11/28/2018, 09/30/2017, 08/04/2017 FINDINGS: Cardiomediastinal silhouette unchanged in size and contour. No evidence of central vascular congestion. No interlobular septal thickening. No pneumothorax or pleural effusion. Right IJ port catheter, unchanged. No confluent airspace disease.  Low lung volumes. IMPRESSION: Negative for acute cardiopulmonary disease. Right IJ port catheter. Electronically Signed   By: Corrie Mckusick D.O.   On:  01/20/2019 14:04   US Paracentesis  Result Date: 01/24/2019 INDICATION: Extra ovarian primary peritoneal carcinoma, recurrent ascites EXAM: ULTRASOUND GUIDED THERAPEUTIC PARACENTESIS MEDICATIONS: None. COMPLICATIONS: None immediate. PROCEDURE: Procedure, benefits, and risks of procedure were discussed with patient. Written informed consent for procedure was obtained. Time out protocol followed. Adequate collection of ascites localized by ultrasound in RIGHT lower quadrant. Skin prepped and draped in usual sterile fashion. Skin and soft tissues anesthetized with 10 mL of 1% lidocaine. 5 [place] Yueh catheter placed into peritoneal cavity. 5.0 L of dark red ascitic fluid aspirated by vacuum bottle suction. Procedure tolerated well by patient without immediate complication. Patient received platelet transfusion, 1 unit before and 1 unit during paracentesis. FINDINGS: As above IMPRESSION: Successful ultrasound-guided paracentesis yielding 5.0 liters of peritoneal fluid. Electronically Signed   By: Lavonia Dana M.D.   On: 01/24/2019 15:06   US Paracentesis  Result Date: 01/13/2019 INDICATION: Ascites. EXAM: ULTRASOUND GUIDED therapeutic PARACENTESIS MEDICATIONS: None. COMPLICATIONS: None immediate. PROCEDURE: Informed written consent was obtained from the patient after a discussion of the risks, benefits and alternatives to treatment. A timeout was performed prior to the initiation of the procedure. Initial ultrasound scanning demonstrates a large amount of ascites within the left lower abdominal quadrant. The right lower abdomen was prepped and draped in the usual sterile fashion. 1% lidocaine with epinephrine was used for local anesthesia. Following this, a paracentesis catheter was introduced. An ultrasound image was saved for documentation purposes. The paracentesis was performed. The catheter was removed and a dressing was applied. The patient tolerated the procedure well without immediate post procedural  complication. FINDINGS: A total of approximately 4.3 L of serous fluid was removed. IMPRESSION: Successful ultrasound-guided paracentesis yielding 4.3 liters of peritoneal fluid. Electronically Signed   By: Marijo Conception, M.D.   On: 01/13/2019 12:47   US Paracentesis  Result Date: 12/31/2018 INDICATION: History of ovarian cancer with abdominal distension from ascites. Patient presents for ultrasound-guided paracentesis. EXAM: ULTRASOUND GUIDED PARACENTESIS MEDICATIONS: Patient given an infusion of 1 dose of 25% human albumin after recovery of 6 L of peritoneal fluid. COMPLICATIONS: None immediate. PROCEDURE: Informed written consent was obtained from the patient after a discussion of the risks, benefits and alternatives to treatment.  A timeout was performed prior to the initiation of the procedure. Initial ultrasound scanning demonstrates a large amount of ascites within the right lower abdominal quadrant. The right lower abdomen was prepped and draped in the usual sterile fashion. 1% lidocaine with epinephrine was used for local anesthesia. Following this, a 19 gauge, 7-cm, Yueh catheter was introduced. An ultrasound image was saved for documentation purposes. The paracentesis was performed. The catheter was removed and a dressing was applied. The patient tolerated the procedure well without immediate post procedural complication. FINDINGS: A total of approximately 8.3 L of straw colored fluid fluid was removed. IMPRESSION: Successful ultrasound-guided paracentesis yielding 8.3 liters of peritoneal fluid. Electronically Signed   By: Lajean Manes M.D.   On: 12/31/2018 12:27   Dg Chest Port 1 View  Result Date: 01/21/2019 CLINICAL DATA:  Increased oxygen demand EXAM: PORTABLE CHEST 1 VIEW COMPARISON:  01/20/2019, 11/28/2018 FINDINGS: Right-sided central venous port tip over the cavoatrial region. No focal airspace disease or effusion. Stable cardiomediastinal silhouette. No pneumothorax. IMPRESSION: No  active disease. Electronically Signed   By: Donavan Foil M.D.   On: 01/21/2019 21:44   [place] Ascites (abdomen Limited)  Result Date: 01/25/2019 CLINICAL DATA:  Malignant ascites EXAM: LIMITED ABDOMEN ULTRASOUND FOR ASCITES TECHNIQUE: Limited ultrasound survey for ascites was performed in all four abdominal quadrants. COMPARISON:  01/24/2019 FINDINGS: Moderate to large volume ascites is present despite paracentesis 1 day ago. Ascites is more prominent in the RIGHT lateral abdomen. IMPRESSION: Significant ascites. Electronically Signed   By: Lavonia Dana M.D.   On: 01/25/2019 16:57    Labs:  CBC: Recent Labs    01/23/19 0600 01/24/19 0450 01/25/19 0448 01/17/2019 0446  WBC 7.2 7.6 7.3 8.6  HGB 6.2* 8.1* 8.5* 9.4*  HCT 19.7* 25.8* 26.8* 29.3*  PLT 16* 18* 50* 39*    COAGS: Recent Labs    01/20/19 1339 01/11/2019 0814  INR 1.2 1.4*    BMP: Recent Labs    01/22/19 1020 01/23/19 0600 01/24/19 0450 01/25/19 0448  NA 127* 128* 130* 134*  K 5.9* 5.0 4.2 3.7  CL 96* 97* 98 101  CO2 18* 21* 19* 20*  GLUCOSE 154* 139* 189* 178*  BUN 74* 76* 84* 79*  CALCIUM 7.8* 8.3* 9.0 9.0  CREATININE 2.46* 2.40* 2.10* 1.46*  GFRNONAA 20* 20* 24* 37*  GFRAA 23* 24* 28* 43*    LIVER FUNCTION TESTS: Recent Labs    12/31/18 1205 01/04/19 0924 01/20/19 1017 01/20/19 1339  BILITOT 0.8 0.8 0.7 0.8  AST 29 27 34 28  ALT 12 15 21 21   ALKPHOS 75 75 97 94  PROT 5.4* 5.2* 5.3* 5.3*  ALBUMIN 2.2* 2.3* 2.2* 2.2*    TUMOR MARKERS: No results for input(s): AFPTM, CEA, CA199, CHROMGRNA in the last 8760 hours.  Assessment and Plan:  Ovarian Ca Recurrent ascites Comfort focus per MD and family  Plan for peritoneal tunneled PleurX catheter placement Pts husband is aware of procedure benefits and risks including but not limited to Infection, bleeding; damage to surrounding structyres He is agreeable to proceed Consent signed via phone  Thank you for this interesting consult.  I greatly  enjoyed meeting Willadeen Colantuono and look forward to participating in their care.  A copy of this report was sent to the requesting provider on this date.  Electronically Signed: Lavonia Drafts, PA-C 01/20/2019, 12:01 PM   I spent a total of 40 Minutes    in face to face in clinical consultation, greater  than 50% of which was counseling/coordinating care for tunneled peritoneal PleurX catheter placement

## 2019-01-26 NOTE — Progress Notes (Signed)
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  The patient had routine blood work performed by her oncologist, and she was noted to have hyponatremia and hyperkalemia.  The patient also had progressive abdominal distention.  She normally requires paracentesis every 7 to 10 days.  Upon presentation, the patient was noted to have sodium 126 with potassium 6.6.  In addition, the patient had acute on chronic renal failure with a serum creatinine peaking at 2.46.  The patient was started on IV fluids and oral bicarbonate.  She was treated appropriately for hyperkalemia.  Since her hospitalization, the patient has been seen by palliative medicine as well as medical oncology.  Dr. Delton Coombes felt that the patient was too weak to receive any additional active therapy for her recurrent cancer at this time. Rucaparib was stopped on 01/20/2019.  We will continue discussions with palliative medicine, the patient and family have agreed to transition to focus on the patient's comfort.  Ultimately, they agreed upon placing a peritoneal catheter as a palliative measure for draining her peritoneal fluid.  IR was consulted, and the procedure is tentatively scheduled for 01/19/2019.  Assessment/Plan: Acute on chronic renal failure--CKD stage III -Baseline creatinine 1.6-1.9 -Serum creatinine peaked 2.46 -Improved with IV fluids  Hyponatremia -Secondary to volume depletion and poor solute intake -Patient was started on sodium bicarbonate by Dr. Dyann Kief -Na has improved  Malignant ascites -The patient has required  paracentesis every 7-10 days -Paracentesis performed 01/24/2019--5 L removed -Planning for peritoneal catheter placement 01/10/2019 as a palliative measure -Case was discussed with IR  Metastatic high-grade serous carcinoma of peritoneum -Appreciate medical oncology consultation -No longer a candidate for further chemo/immunotherapy -Initial diagnosis June 2017 -Debulking surgery 10/14/2016--Dr. Denman George -12/10/2018 CT abdomen--extensive omental caking, peritoneal implant along the transverse colon, tumor caking along the gastrohepatic ligament and below the stomach  Diabetes mellitus type 2 -At this point, given the patient's clinical situation, allow for liberal glycemic control -Discontinue NovoLog sliding scale as the patient's focus of care has been transition to comfort  Thrombocytopenia -Secondary to chemo/immunotherapy and malignancy -Monitor for signs of bleeding  Anemia of chronic disease/chemotherapy associated anemia -Baseline hemoglobin 8-9 -Transfused 2 units PRBC during this admission  Generalized weakness -Multifactorial including acute on chronic renal failure, hyponatremia, anemia, and progression of underlying malignancy -No further work-up at this time as the patient's focus of care has been transition to focus on comfort  Diarrhea -prn imodium  Hyperkalemia -treated      Disposition Plan:   Residential vs home hospice Family Communication:   Spouse updated at bedside 3/25  Consultants: med/onc, IR, palliative medicine    Code Status:  DNR  DVT Prophylaxis: comfort care   Procedures: As Listed in Progress Note Above  Antibiotics: None      Subjective: Pt feels anxious.  Denies cp, sob, n/v, abd pain.  No f,c, headache  Objective: Vitals:   01/25/19 2002 01/25/19 2123 01/17/2019 0552 01/13/2019 0821  BP:  101/60 100/66   Pulse:  (!) 101 (!) 124   Resp:  (!) 32 (!) 28   Temp:  98 F (36.7 C)  98.8 F (37.1 C)   TempSrc:  Oral Oral   SpO2:  98% 96% 93% 93%  Weight:      Height:        Intake/Output Summary (Last 24 hours) at 01/03/2019 0851 Last data filed at 01/24/2019 0837 Gross per 24 hour  Intake 480 ml  Output 1250 ml  Net -770 ml   Weight change:  Exam:   General:  Pt is alert, follows commands appropriately, not in acute distress  HEENT: No icterus, No thrush, No neck mass, [place]/AT  Cardiovascular: RRR, S1/S2, no rubs, no gallops  Respiratory: diminished breath sounds, no wheeze  Abdomen: Soft/+BS, non tender, non distended, no guarding  Extremities: trace LE edema, No lymphangitis, No petechiae, No rashes, no synovitis   Data Reviewed: I have personally reviewed following labs and imaging studies Basic Metabolic Panel: Recent Labs  Lab 01/20/19 1017  01/21/19 0554 01/22/19 1020 01/23/19 0600 01/24/19 0450 01/25/19 0448  NA 127*   < > 126* 127* 128* 130* 134*  K 6.6*   < > 6.1* 5.9* 5.0 4.2 3.7  CL 97*   < > 97* 96* 97* 98 101  CO2 19*   < > 18* 18* 21* 19* 20*  GLUCOSE 186*   < > 193* 154* 139* 189* 178*  BUN 64*   < > 68* 74* 76* 84* 79*  CREATININE 2.17*   < > 2.39* 2.46* 2.40* 2.10* 1.46*  CALCIUM 8.3*   < > 7.9* 7.8* 8.3* 9.0 9.0  MG 2.6*  --   --   --   --   --   --    < > = values in this interval not displayed.   Liver Function Tests: Recent Labs  Lab 01/20/19 1017 01/20/19 1339  AST 34 28  ALT 21 21  ALKPHOS 97 94  BILITOT 0.7 0.8  PROT 5.3* 5.3*  ALBUMIN 2.2* 2.2*   No results for input(s): LIPASE, AMYLASE in the last 168 hours. No results for input(s): AMMONIA in the last 168 hours. Coagulation Profile: Recent Labs  Lab 01/20/19 1339 01/07/2019 0814  INR 1.2 1.4*   CBC: Recent Labs  Lab 01/20/19 1017 01/20/19 1339  01/22/19 1020 01/23/19 0600 01/24/19 0450 01/25/19 0448 01/16/2019 0446  WBC 11.4* 11.5*   < > 10.5 7.2 7.6 7.3 8.6  NEUTROABS 9.9* 10.2*  --   --   --   --   --   --   HGB 9.9* 9.6*   < > 8.1* 6.2* 8.1* 8.5* 9.4*  HCT 31.8* 30.6*   < > 25.8* 19.7*  25.8* 26.8* 29.3*  MCV 105.3* 102.7*   < > 103.2* 103.7* 94.5 94.4 93.9  PLT 33* 34*   < > 18* 16* 18* 50* 39*   < > = values in this interval not displayed.   Cardiac Enzymes: No results for input(s): CKTOTAL, CKMB, CKMBINDEX, TROPONINI in the last 168 hours. BNP: Invalid input(s): POCBNP CBG: Recent Labs  Lab 01/25/19 0729 01/25/19 1115 01/25/19 1607 01/25/19 2120 01/06/2019 0728  GLUCAP 150* 259* 182* 156* 178*   HbA1C: No results for input(s): HGBA1C in the last 72 hours. Urine analysis:    Component Value Date/Time   COLORURINE YELLOW 11/28/2018 1430   APPEARANCEUR HAZY (A) 11/28/2018 1430   LABSPEC 1.020 11/28/2018 1430   PHURINE 5.0 11/28/2018 1430   GLUCOSEU NEGATIVE 11/28/2018 1430   HGBUR NEGATIVE 11/28/2018 1430   BILIRUBINUR NEGATIVE 11/28/2018 1430   [place] 11/28/2018 1430  PROTEINUR NEGATIVE 11/28/2018 1430   NITRITE NEGATIVE 11/28/2018 1430   LEUKOCYTESUR NEGATIVE 11/28/2018 1430   Sepsis Labs: @LABRCNTIP (procalcitonin:4,lacticidven:4) ) Recent Results (from the past 240 hour(s))  Culture, blood (Routine x 2)     Status: None   Collection Time: 01/20/19  1:39 PM  Result Value Ref Range Status   Specimen Description BLOOD DRAWN BY RN SITE NOT SPECIFIED  Final   Special Requests   Final    BOTTLES DRAWN AEROBIC AND ANAEROBIC Blood Culture adequate volume   Culture   Final    NO GROWTH 5 DAYS Performed at [facility], [address] [place], [place] [address]    Report Status 01/25/2019 FINAL  Final  Culture, blood (Routine x 2)     Status: None   Collection Time: 01/20/19  3:42 PM  Result Value Ref Range Status   Specimen Description RIGHT ANTECUBITAL  Final   Special Requests   Final    BOTTLES DRAWN AEROBIC AND ANAEROBIC Blood Culture adequate volume   Culture   Final    NO GROWTH 5 DAYS Performed at [facility], [address] [place], [place] [address]    Report Status 01/25/2019 FINAL  Final     Scheduled Meds:  feeding  supplement (GLUCERNA SHAKE)  237 mL Oral TID BM   furosemide  20 mg Oral Daily   insulin aspart  0-5 Units Subcutaneous QHS   insulin aspart  0-9 Units Subcutaneous TID WC   mouth rinse  15 mL Mouth Rinse BID   pantoprazole  40 mg Oral BID   sodium bicarbonate  650 mg Oral BID   Continuous Infusions:  Procedures/Studies: Dg Chest 2 View  Result Date: 01/20/2019 CLINICAL DATA:  68 year old female with a history of shortness of breath EXAM: CHEST - 2 VIEW COMPARISON:  11/28/2018, 09/30/2017, 08/04/2017 FINDINGS: Cardiomediastinal silhouette unchanged in size and contour. No evidence of central vascular congestion. No interlobular septal thickening. No pneumothorax or pleural effusion. Right IJ port catheter, unchanged. No confluent airspace disease.  Low lung volumes. IMPRESSION: Negative for acute cardiopulmonary disease. Right IJ port catheter. Electronically Signed   By: Corrie Mckusick D.O.   On: 01/20/2019 14:04   US Paracentesis  Result Date: 01/24/2019 INDICATION: Extra ovarian primary peritoneal carcinoma, recurrent ascites EXAM: ULTRASOUND GUIDED THERAPEUTIC PARACENTESIS MEDICATIONS: None. COMPLICATIONS: None immediate. PROCEDURE: Procedure, benefits, and risks of procedure were discussed with patient. Written informed consent for procedure was obtained. Time out protocol followed. Adequate collection of ascites localized by ultrasound in RIGHT lower quadrant. Skin prepped and draped in usual sterile fashion. Skin and soft tissues anesthetized with 10 mL of 1% lidocaine. 5 [place] Yueh catheter placed into peritoneal cavity. 5.0 L of dark red ascitic fluid aspirated by vacuum bottle suction. Procedure tolerated well by patient without immediate complication. Patient received platelet transfusion, 1 unit before and 1 unit during paracentesis. FINDINGS: As above IMPRESSION: Successful ultrasound-guided paracentesis yielding 5.0 liters of peritoneal fluid. Electronically Signed   By: Lavonia Dana M.D.   On: 01/24/2019 15:06   US Paracentesis  Result Date: 01/13/2019 INDICATION: Ascites. EXAM: ULTRASOUND GUIDED therapeutic PARACENTESIS MEDICATIONS: None. COMPLICATIONS: None immediate. PROCEDURE: Informed written consent was obtained from the patient after a discussion of the risks, benefits and alternatives to treatment. A timeout was performed prior to the initiation of the procedure. Initial ultrasound scanning demonstrates a large amount of ascites within the left lower abdominal quadrant. The right lower abdomen was prepped and draped in the usual sterile fashion. 1%  lidocaine with epinephrine was used for local anesthesia. Following this, a paracentesis catheter was introduced. An ultrasound image was saved for documentation purposes. The paracentesis was performed. The catheter was removed and a dressing was applied. The patient tolerated the procedure well without immediate post procedural complication. FINDINGS: A total of approximately 4.3 L of serous fluid was removed. IMPRESSION: Successful ultrasound-guided paracentesis yielding 4.3 liters of peritoneal fluid. Electronically Signed   By: Marijo Conception, M.D.   On: 01/13/2019 12:47   US Paracentesis  Result Date: 12/31/2018 INDICATION: History of ovarian cancer with abdominal distension from ascites. Patient presents for ultrasound-guided paracentesis. EXAM: ULTRASOUND GUIDED PARACENTESIS MEDICATIONS: Patient given an infusion of 1 dose of 25% human albumin after recovery of 6 L of peritoneal fluid. COMPLICATIONS: None immediate. PROCEDURE: Informed written consent was obtained from the patient after a discussion of the risks, benefits and alternatives to treatment. A timeout was performed prior to the initiation of the procedure. Initial ultrasound scanning demonstrates a large amount of ascites within the right lower abdominal quadrant. The right lower abdomen was prepped and draped in the usual sterile fashion. 1% lidocaine with  epinephrine was used for local anesthesia. Following this, a 19 gauge, 7-cm, Yueh catheter was introduced. An ultrasound image was saved for documentation purposes. The paracentesis was performed. The catheter was removed and a dressing was applied. The patient tolerated the procedure well without immediate post procedural complication. FINDINGS: A total of approximately 8.3 L of straw colored fluid fluid was removed. IMPRESSION: Successful ultrasound-guided paracentesis yielding 8.3 liters of peritoneal fluid. Electronically Signed   By: Lajean Manes M.D.   On: 12/31/2018 12:27   US Paracentesis  Result Date: 12/27/2018 INDICATION: Malignant ascites.  Ovarian cancer. EXAM: ULTRASOUND GUIDED  PARACENTESIS MEDICATIONS: None. COMPLICATIONS: None immediate. PROCEDURE: Informed written consent was obtained from the patient after a discussion of the risks, benefits and alternatives to treatment. A timeout was performed prior to the initiation of the procedure. Initial ultrasound scanning demonstrates a moderate amount of ascites within the left lower abdominal quadrant. The left lower abdomen was prepped and draped in the usual sterile fashion. 1% lidocaine with epinephrine was used for local anesthesia. Following this, a 19 gauge, 7-cm, Yueh catheter was introduced. An ultrasound image was saved for documentation purposes. The paracentesis was performed. After approximately 800 cc of serous fluid were removed, the fluid became sanguinous and the catheter clotted. Therefore, a second Yueh catheter was inserted at the same site. This catheter returned sanguinous fluid. At this point, decision was made to forego further paracentesis. FINDINGS: A total of approximately 800 cc of serous fluid was removed. IMPRESSION: Ultrasound-guided paracentesis yielding 800 cc of peritoneal fluid. Electronically Signed   By: Abigail Miyamoto M.D.   On: 12/27/2018 10:47   Dg Chest Port 1 View  Result Date: 01/21/2019 CLINICAL DATA:   Increased oxygen demand EXAM: PORTABLE CHEST 1 VIEW COMPARISON:  01/20/2019, 11/28/2018 FINDINGS: Right-sided central venous port tip over the cavoatrial region. No focal airspace disease or effusion. Stable cardiomediastinal silhouette. No pneumothorax. IMPRESSION: No active disease. Electronically Signed   By: Donavan Foil M.D.   On: 01/21/2019 21:44   [place] Ascites (abdomen Limited)  Result Date: 01/25/2019 CLINICAL DATA:  Malignant ascites EXAM: LIMITED ABDOMEN ULTRASOUND FOR ASCITES TECHNIQUE: Limited ultrasound survey for ascites was performed in all four abdominal quadrants. COMPARISON:  01/24/2019 FINDINGS: Moderate to large volume ascites is present despite paracentesis 1 day ago. Ascites is more prominent in the RIGHT lateral abdomen.  IMPRESSION: Significant ascites. Electronically Signed   By: Lavonia Dana M.D.   On: 01/25/2019 16:57    Orson Eva, DO  [facility] Pager [phone]  If 7PM-7AM, please contact night-coverage [email or web address] Password [facility] 01/12/2019, 8:51 AM   LOS: 5 days

## 2019-01-26 NOTE — Progress Notes (Signed)
2130- Reminded patient and her husband that patient would be NPO at midnight for procedure tomorrow. Patient and husband were upset and shocked because "this was the first we've heard of this." Husband relayed the MD had been in that day and "mentioned a test they could do" but he was unable to relate details and said the patient had been asleep during any explanations. Also told RN an imaging tech had been in and was also unable to answer his questions. RN also called night MD at patient's request; per night MD the ordering provider would speak with them in the morning before [facility] takes her to [facility] for the procedure unable to obtain consent from patient, night MD notified . Told patient's husband, and he was very upset because the ordering provider "told me he would be off tomorrow." Assured him one of his colleagues would be here to speak with them in the morning.  34- Husband has returned home, patient still very anxious regardless of any interventions, still having many liquid BMs.

## 2019-01-26 NOTE — Sedation Documentation (Signed)
Report given to [place], [place], Therapist, sports.

## 2019-01-26 NOTE — Progress Notes (Signed)
[facility] called for report for procedure this am. [facility] notified that consent was not obtained. IR notified. Instructed by IR staff to contact the PA at IR for more information. Will notify oncoming day shift RN.

## 2019-01-26 NOTE — Procedures (Signed)
Pre procedural Dx: Metastatic ovarian cancer Post procedural Dx: Same  Technically successful image guided placement of a tunneled peritoneal drain for palliative purposes. Large volume paracentesis performed following drain placement.   EBL: None Complications: None immediate  Ronny Bacon, MD Pager #: [phone]

## 2019-01-27 DIAGNOSIS — D696 Thrombocytopenia, unspecified: Secondary | ICD-10-CM

## 2019-01-27 DIAGNOSIS — R180 Malignant ascites: Secondary | ICD-10-CM

## 2019-01-27 DIAGNOSIS — T451X5A Adverse effect of antineoplastic and immunosuppressive drugs, initial encounter: Secondary | ICD-10-CM

## 2019-01-27 DIAGNOSIS — N183 Chronic kidney disease, stage 3 (moderate): Secondary | ICD-10-CM

## 2019-01-27 DIAGNOSIS — D61810 Antineoplastic chemotherapy induced pancytopenia: Secondary | ICD-10-CM

## 2019-01-27 DIAGNOSIS — C481 Malignant neoplasm of specified parts of peritoneum: Secondary | ICD-10-CM

## 2019-01-27 DIAGNOSIS — G620 Drug-induced polyneuropathy: Secondary | ICD-10-CM

## 2019-01-27 DIAGNOSIS — R627 Adult failure to thrive: Secondary | ICD-10-CM

## 2019-01-27 DIAGNOSIS — N179 Acute kidney failure, unspecified: Secondary | ICD-10-CM

## 2019-01-27 MED ORDER — MORPHINE SULFATE (PF) 2 MG/ML IV SOLN
1.0000 mg | INTRAVENOUS | Status: DC | PRN
  Administered 2019-01-27 – 2019-01-29 (×7): 1 mg via INTRAVENOUS
  Filled 2019-01-27 (×7): qty 1

## 2019-01-27 NOTE — Progress Notes (Addendum)
Chart reviewed. Transitioned to GIP status yesterday. [facility] following and will transition to hospice home when bed available. F/u with patient and husband for needs. She is resting comfortably with no s/s of distress. Respirations regular, non-labored. Recently given prn pain medication. Husband tearful but at peace with decision for comfort and hospice services. Discussed EOL expectations. Emotional support provided. Declined chaplain services. Comfort medications on MAR.   NO CHARGE  Ihor Dow, [place], FNP-C Palliative Medicine Team  Phone: [phone] Fax: [phone]

## 2019-01-27 NOTE — Discharge Summary (Signed)
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 The patient had routine blood work performed by her oncologist, and she was noted to have hyponatremia and hyperkalemia. The patient also had progressive abdominal distention. She normally requires paracentesis every 7 to 10 days. Upon presentation, the patient was noted to have sodium 126 with potassium 6.6. In addition, the patient had acute on chronic renal failure with a serum creatinine peaking at 2.46.The patient was started on IV fluids and oral bicarbonate. She was treated appropriately for hyperkalemia. Since her hospitalization, the patient has been seen by palliative medicine as well as medical oncology. Dr. Delton Coombes felt that the patient was too weak to receive any additional active therapy for her recurrent cancer at this time. Rucaparib was stopped on 01/20/2019.We will continue discussions with palliative medicine, the patient and family have agreed to transition to focus on the patient's comfort. Ultimately, they agreed upon placing a peritoneal catheter as a palliative measure for draining her peritoneal fluid. IR was consulted, and the procedurewas performed on 01/18/2019 with another 4.1 L drained after the procedure.  Unfortunately, there was not a bed available at residential hospice in  [facility].  As a result, the patient was admitted to general inpatient hospice until a bed was available  Discharge Diagnoses:  Acute on chronic renal failure--CKD stage III -Baseline creatinine 1.6-1.9 -Serum creatinine peaked 2.46 -Improved with IV fluids -After discussions with palliative medicine, the patient and spouse felt it was in the patient's best interest to transition her focus of care to full comfort to maintain her dignity and allow for a natural death.  Hyponatremia -Secondary to volume depletion and poor solute intake -Patient was started on sodium bicarbonateby Dr. Dyann Kief -Na has improved  Malignant ascites -The patient has required paracentesis every 7-10 days -Paracentesis performed 01/24/2019--5 L removed -peritoneal catheter placement 01/25/2019 as a palliative measure -drain as needed for accumulation of malignant ascites  Metastatic high-grade serous carcinoma of peritoneum -Appreciate medical oncology consultation -No longer a candidate for further chemo/immunotherapy -Initial diagnosis June 2017 -Debulking surgery 10/14/2016--Dr. Denman George -12/10/2018 CT abdomen--extensive omental caking, peritoneal implant along the transverse colon, tumor caking along the gastrohepatic ligament and below the stomach -After discussions with palliative medicine, the patient and spouse felt it was in the patient's best interest to transition her focus of care to full comfort to maintain her dignity and allow for a natural death.  Diabetes mellitus type 2 -At this point, given the patient's clinical situation, allow for liberal glycemic control -Discontinue NovoLog sliding scale as the patient's focus of care has been transition tocomfort  Thrombocytopenia -Secondary to chemo/immunotherapy and malignancy -Monitor for signs of bleeding  Anemia of chronic disease/chemotherapy associated anemia -Baseline hemoglobin 8-9 -Transfused 2 units PRBC during this  admission  Generalized weakness -Multifactorial including acute on chronic renal failure, hyponatremia, anemia, and progression of underlying malignancy -No further work-up at this time as the patient's focus of care has been transition to focus on comfort  Diarrhea -prnlomotil  Hyperkalemia -treated -no longer active issue  Goals of Care -full comfort care -add morphine -discussed with spouse at bedside who is not amendable to morphine for pain and dyspnea    Discharge Instructions   Allergies as of 01/05/2019      Reactions   Avandia [rosiglitazone] Other (See Comments)   Legs swelled   Micronase [glyburide] Swelling   Actos [pioglitazone] Other (See Comments)   Edema / leg swelling      Medication List    ASK your doctor about these medications   atorvastatin 40 MG tablet Commonly known as:  LIPITOR Take 1 tablet (40 mg total) by mouth daily.   Biotin 1000 MCG tablet Take 1,000 mcg by mouth daily.   furosemide 20 MG tablet Commonly known as:  LASIX TAKE 1 TABLET(20 MG) BY MOUTH TWICE DAILY   glucose blood test strip Commonly known as:  ONE TOUCH ULTRA TEST Use to test blood sugar 6-8 times daily   HYDROcodone-acetaminophen 10-325 MG tablet Commonly known as:  NORCO Take 1 tablet by mouth every 4 (four) hours as needed.   insulin lispro 100 UNIT/ML injection Commonly known as:  HumaLOG USE IN INSULIN PUMP AS DIRECTED. MAX DAILY DOSE OF 110 UNITS PER DAY.   lidocaine-prilocaine cream Commonly known as:  EMLA Apply a quarter size amount to port site 1 hour prior to chemo. Do not rub in. Cover with plastic wrap.   magnesium oxide 400 MG tablet Commonly known as:  MAG-OX Take 400 mg by mouth 2 (two) times daily. Pt taking once a day   multivitamin with minerals Tabs tablet Take 1 tablet by mouth daily.   omeprazole 40 MG capsule Commonly known as:  PRILOSEC Take 1 capsule (40 mg total) by mouth daily.   polyethylene glycol packet Commonly  known as:  MIRALAX / GLYCOLAX Take 17 g by mouth daily as needed for mild constipation.   prochlorperazine 10 MG tablet Commonly known as:  COMPAZINE Take 1 tablet (10 mg total) by mouth every 6 (six) hours as needed for nausea or vomiting.   Rubraca 300 MG tablet Generic drug:  rucaparib camsylate Take 1 tablet (300 mg total) by mouth daily.   vitamin B-12 1000 MCG tablet Commonly known as:  CYANOCOBALAMIN Take 1,000 mcg by mouth daily.   vitamin E 400 UNIT capsule Generic drug:  vitamin E Take 400 Units by mouth daily.      Contact information for after-discharge care    [facility] .   Service:  Skilled Nursing Contact information: [address] [place] [address] [phone]             Allergies  Allergen Reactions   Avandia [Rosiglitazone] Other (See Comments)    Legs swelled   Micronase [Glyburide] Swelling   Actos [Pioglitazone] Other (See Comments)    Edema / leg swelling    Consultations:  Palliative medicine   Procedures/Studies: Dg Chest 2 View  Result Date: 01/20/2019 CLINICAL DATA:  68 year old female with a history of shortness of breath EXAM: CHEST - 2 VIEW COMPARISON:  11/28/2018, 09/30/2017, 08/04/2017 FINDINGS: Cardiomediastinal silhouette unchanged in size and contour. No evidence of central vascular congestion. No interlobular septal thickening. No pneumothorax or pleural effusion. Right IJ port catheter, unchanged. No confluent airspace disease.  Low lung volumes. IMPRESSION: Negative for acute cardiopulmonary disease. Right IJ port catheter. Electronically Signed   By: Corrie Mckusick D.O.   On: 01/20/2019 14:04  US Paracentesis  Result Date: 01/24/2019 INDICATION: Extra ovarian primary peritoneal carcinoma, recurrent ascites EXAM: ULTRASOUND GUIDED THERAPEUTIC PARACENTESIS MEDICATIONS: None. COMPLICATIONS: None immediate. PROCEDURE: Procedure, benefits, and risks of procedure were  discussed with patient. Written informed consent for procedure was obtained. Time out protocol followed. Adequate collection of ascites localized by ultrasound in RIGHT lower quadrant. Skin prepped and draped in usual sterile fashion. Skin and soft tissues anesthetized with 10 mL of 1% lidocaine. 5 [place] Yueh catheter placed into peritoneal cavity. 5.0 L of dark red ascitic fluid aspirated by vacuum bottle suction. Procedure tolerated well by patient without immediate complication. Patient received platelet transfusion, 1 unit before and 1 unit during paracentesis. FINDINGS: As above IMPRESSION: Successful ultrasound-guided paracentesis yielding 5.0 liters of peritoneal fluid. Electronically Signed   By: Lavonia Dana M.D.   On: 01/24/2019 15:06   US Paracentesis  Result Date: 01/13/2019 INDICATION: Ascites. EXAM: ULTRASOUND GUIDED therapeutic PARACENTESIS MEDICATIONS: None. COMPLICATIONS: None immediate. PROCEDURE: Informed written consent was obtained from the patient after a discussion of the risks, benefits and alternatives to treatment. A timeout was performed prior to the initiation of the procedure. Initial ultrasound scanning demonstrates a large amount of ascites within the left lower abdominal quadrant. The right lower abdomen was prepped and draped in the usual sterile fashion. 1% lidocaine with epinephrine was used for local anesthesia. Following this, a paracentesis catheter was introduced. An ultrasound image was saved for documentation purposes. The paracentesis was performed. The catheter was removed and a dressing was applied. The patient tolerated the procedure well without immediate post procedural complication. FINDINGS: A total of approximately 4.3 L of serous fluid was removed. IMPRESSION: Successful ultrasound-guided paracentesis yielding 4.3 liters of peritoneal fluid. Electronically Signed   By: Marijo Conception, M.D.   On: 01/13/2019 12:47   US Paracentesis  Result Date:  12/31/2018 INDICATION: History of ovarian cancer with abdominal distension from ascites. Patient presents for ultrasound-guided paracentesis. EXAM: ULTRASOUND GUIDED PARACENTESIS MEDICATIONS: Patient given an infusion of 1 dose of 25% human albumin after recovery of 6 L of peritoneal fluid. COMPLICATIONS: None immediate. PROCEDURE: Informed written consent was obtained from the patient after a discussion of the risks, benefits and alternatives to treatment. A timeout was performed prior to the initiation of the procedure. Initial ultrasound scanning demonstrates a large amount of ascites within the right lower abdominal quadrant. The right lower abdomen was prepped and draped in the usual sterile fashion. 1% lidocaine with epinephrine was used for local anesthesia. Following this, a 19 gauge, 7-cm, Yueh catheter was introduced. An ultrasound image was saved for documentation purposes. The paracentesis was performed. The catheter was removed and a dressing was applied. The patient tolerated the procedure well without immediate post procedural complication. FINDINGS: A total of approximately 8.3 L of straw colored fluid fluid was removed. IMPRESSION: Successful ultrasound-guided paracentesis yielding 8.3 liters of peritoneal fluid. Electronically Signed   By: Lajean Manes M.D.   On: 12/31/2018 12:27   Ir Perc Athena Masse Perit Cath [facility]  Result Date: 01/05/2019 INDICATION: History of ovarian cancer with recurrent symptomatic malignant ascites. Please perform image guided peritoneal catheter placement for palliative purposes. EXAM: ULTRASOUND AND FLUOROSCOPIC GUIDED PLACEMENT OF TUNNELED PERITONEAL CATHETER COMPARISON:  Multiple previous ultrasound-guided paracenteses most recently on 01/24/2019 yielding 5 L of peritoneal fluid. CT abdomen and pelvis - 12/10/2018 MEDICATIONS: Ancef 2 gm IV; Antibiotics were administered within an appropriate time frame prior to skin puncture. CONTRAST:  None. ANESTHESIA/SEDATION:  Moderate (conscious) sedation was employed during  this procedure. A total of Versed 0.5 mg was administered intravenously. Moderate Sedation Time: 16 minutes. The patient's level of consciousness and vital signs were monitored continuously by radiology nursing throughout the procedure under my direct supervision. FLUOROSCOPY TIME:  18 seconds (3 mGy) COMPLICATIONS: None immediate. TECHNIQUE: Informed written consent was obtained from the patient after a discussion of the risks, benefits and alternatives to treatment. Questions regarding the procedure were encouraged and answered. A timeout was performed prior to the initiation of the procedure. Ultrasound scanning of the right lower abdominal quadrant demonstrates a moderate amount of complex fluid within the right lower abdominal quadrant. The right lower abdomen was prepped and draped usual sterile fashion a sterile drape was applied, covering the operative table. Maximum barrier sterile technique with sterile gowns and gloves were used for the procedure. A timeout was performed prior to the initiation of the procedure. Local anesthesia was provided with 1% lidocaine with epinephrine. Under direct ultrasound guidance, an 18 gauge trocar needle was advanced into the peritoneal space with the right abdominal quadrant. Appropriate positioning was confirmed with the efflux of ascites. An Amplatz super stiff wire was advanced across the abdomen under intermittent fluoroscopic guidance. The access site was dilated over the Amplatz wire, allowing advancement of a peel-away sheath. The peritoneal catheter was tunneled in an antegrade fashion from a site along the right mid clavicular line to the access site and inserted through the peel-away sheath with tip ultimately terminating within left mid hemiabdomen. Several postprocedural spot abdominal radiographs were obtained. The catheter was connected to suction canisters and approximately 4.1 L cc of serous ascites was  aspirated. The access site was closed with an interrupted 4-0 Vicryl suture, Dermabond and Steri-Strips. Dressings were placed. The patient tolerated procedure well without immediate postprocedural complication. FINDINGS: After successful ultrasound and fluoroscopic guided peritoneal drainage catheter placement, the right lower quadrant abdominal approach drainage catheter terminates within the left mid hemiabdomen. Following tunneled peritoneal drainage catheter placement, approximately 4.1 L of serous fluid was aspirated. IMPRESSION: 1. Successful ultrasound and fluoroscopic guided placement of a tunneled peritoneal drainage catheter. 2. Successful aspiration of 4.1 L of serous ascites following tunneled peritoneal drainage catheter placement. Electronically Signed   By: Sandi Mariscal M.D.   On: 01/22/2019 14:56   Dg Chest Port 1 View  Result Date: 01/21/2019 CLINICAL DATA:  Increased oxygen demand EXAM: PORTABLE CHEST 1 VIEW COMPARISON:  01/20/2019, 11/28/2018 FINDINGS: Right-sided central venous port tip over the cavoatrial region. No focal airspace disease or effusion. Stable cardiomediastinal silhouette. No pneumothorax. IMPRESSION: No active disease. Electronically Signed   By: Donavan Foil M.D.   On: 01/21/2019 21:44   [place] Ascites (abdomen Limited)  Result Date: 01/25/2019 CLINICAL DATA:  Malignant ascites EXAM: LIMITED ABDOMEN ULTRASOUND FOR ASCITES TECHNIQUE: Limited ultrasound survey for ascites was performed in all four abdominal quadrants. COMPARISON:  01/24/2019 FINDINGS: Moderate to large volume ascites is present despite paracentesis 1 day ago. Ascites is more prominent in the RIGHT lateral abdomen. IMPRESSION: Significant ascites. Electronically Signed   By: Lavonia Dana M.D.   On: 01/25/2019 16:57         Discharge Exam: Vitals:   01/31/2019 1423 01/09/2019 2300  BP: (!) 96/48 98/62  Pulse: (!) 103 (!) 102  Resp: 19 20  Temp: 98.5 F (36.9 C) 98.4 F (36.9 C)  SpO2: 98% 98%    Vitals:   01/11/2019 0821 01/12/2019 1034 01/27/2019 1423 01/02/2019 2300  BP:  (!) 88/54 (!) 96/48 98/62  Pulse:  99 (!) 103 (!) 102  Resp:  16 19 20   Temp:   98.5 F (36.9 C) 98.4 F (36.9 C)  TempSrc:   Oral Oral  SpO2: 93% 93% 98% 98%  Weight:      Height:        General: Pt is alert, awake, not in acute distress Cardiovascular: RRR, S1/S2 +, no rubs, no gallops Respiratory: CTA bilaterally, no wheezing, no rhonchi Abdominal: Soft, NT, ND, bowel sounds + Extremities: no edema, no cyanosis   The results of significant diagnostics from this hospitalization (including imaging, microbiology, ancillary and laboratory) are listed below for reference.    Significant Diagnostic Studies: Dg Chest 2 View  Result Date: 01/20/2019 CLINICAL DATA:  68 year old female with a history of shortness of breath EXAM: CHEST - 2 VIEW COMPARISON:  11/28/2018, 09/30/2017, 08/04/2017 FINDINGS: Cardiomediastinal silhouette unchanged in size and contour. No evidence of central vascular congestion. No interlobular septal thickening. No pneumothorax or pleural effusion. Right IJ port catheter, unchanged. No confluent airspace disease.  Low lung volumes. IMPRESSION: Negative for acute cardiopulmonary disease. Right IJ port catheter. Electronically Signed   By: Corrie Mckusick D.O.   On: 01/20/2019 14:04   US Paracentesis  Result Date: 01/24/2019 INDICATION: Extra ovarian primary peritoneal carcinoma, recurrent ascites EXAM: ULTRASOUND GUIDED THERAPEUTIC PARACENTESIS MEDICATIONS: None. COMPLICATIONS: None immediate. PROCEDURE: Procedure, benefits, and risks of procedure were discussed with patient. Written informed consent for procedure was obtained. Time out protocol followed. Adequate collection of ascites localized by ultrasound in RIGHT lower quadrant. Skin prepped and draped in usual sterile fashion. Skin and soft tissues anesthetized with 10 mL of 1% lidocaine. 5 [place] Yueh catheter placed into peritoneal cavity.  5.0 L of dark red ascitic fluid aspirated by vacuum bottle suction. Procedure tolerated well by patient without immediate complication. Patient received platelet transfusion, 1 unit before and 1 unit during paracentesis. FINDINGS: As above IMPRESSION: Successful ultrasound-guided paracentesis yielding 5.0 liters of peritoneal fluid. Electronically Signed   By: Lavonia Dana M.D.   On: 01/24/2019 15:06   US Paracentesis  Result Date: 01/13/2019 INDICATION: Ascites. EXAM: ULTRASOUND GUIDED therapeutic PARACENTESIS MEDICATIONS: None. COMPLICATIONS: None immediate. PROCEDURE: Informed written consent was obtained from the patient after a discussion of the risks, benefits and alternatives to treatment. A timeout was performed prior to the initiation of the procedure. Initial ultrasound scanning demonstrates a large amount of ascites within the left lower abdominal quadrant. The right lower abdomen was prepped and draped in the usual sterile fashion. 1% lidocaine with epinephrine was used for local anesthesia. Following this, a paracentesis catheter was introduced. An ultrasound image was saved for documentation purposes. The paracentesis was performed. The catheter was removed and a dressing was applied. The patient tolerated the procedure well without immediate post procedural complication. FINDINGS: A total of approximately 4.3 L of serous fluid was removed. IMPRESSION: Successful ultrasound-guided paracentesis yielding 4.3 liters of peritoneal fluid. Electronically Signed   By: Marijo Conception, M.D.   On: 01/13/2019 12:47   US Paracentesis  Result Date: 12/31/2018 INDICATION: History of ovarian cancer with abdominal distension from ascites. Patient presents for ultrasound-guided paracentesis. EXAM: ULTRASOUND GUIDED PARACENTESIS MEDICATIONS: Patient given an infusion of 1 dose of 25% human albumin after recovery of 6 L of peritoneal fluid. COMPLICATIONS: None immediate. PROCEDURE: Informed written consent was  obtained from the patient after a discussion of the risks, benefits and alternatives to treatment. A timeout was performed prior to the initiation of the procedure. Initial ultrasound scanning demonstrates a  large amount of ascites within the right lower abdominal quadrant. The right lower abdomen was prepped and draped in the usual sterile fashion. 1% lidocaine with epinephrine was used for local anesthesia. Following this, a 19 gauge, 7-cm, Yueh catheter was introduced. An ultrasound image was saved for documentation purposes. The paracentesis was performed. The catheter was removed and a dressing was applied. The patient tolerated the procedure well without immediate post procedural complication. FINDINGS: A total of approximately 8.3 L of straw colored fluid fluid was removed. IMPRESSION: Successful ultrasound-guided paracentesis yielding 8.3 liters of peritoneal fluid. Electronically Signed   By: Lajean Manes M.D.   On: 12/31/2018 12:27   Ir Perc Athena Masse Perit Cath [facility]  Result Date: 01/02/2019 INDICATION: History of ovarian cancer with recurrent symptomatic malignant ascites. Please perform image guided peritoneal catheter placement for palliative purposes. EXAM: ULTRASOUND AND FLUOROSCOPIC GUIDED PLACEMENT OF TUNNELED PERITONEAL CATHETER COMPARISON:  Multiple previous ultrasound-guided paracenteses most recently on 01/24/2019 yielding 5 L of peritoneal fluid. CT abdomen and pelvis - 12/10/2018 MEDICATIONS: Ancef 2 gm IV; Antibiotics were administered within an appropriate time frame prior to skin puncture. CONTRAST:  None. ANESTHESIA/SEDATION: Moderate (conscious) sedation was employed during this procedure. A total of Versed 0.5 mg was administered intravenously. Moderate Sedation Time: 16 minutes. The patient's level of consciousness and vital signs were monitored continuously by radiology nursing throughout the procedure under my direct supervision. FLUOROSCOPY TIME:  18 seconds (3 mGy) COMPLICATIONS:  None immediate. TECHNIQUE: Informed written consent was obtained from the patient after a discussion of the risks, benefits and alternatives to treatment. Questions regarding the procedure were encouraged and answered. A timeout was performed prior to the initiation of the procedure. Ultrasound scanning of the right lower abdominal quadrant demonstrates a moderate amount of complex fluid within the right lower abdominal quadrant. The right lower abdomen was prepped and draped usual sterile fashion a sterile drape was applied, covering the operative table. Maximum barrier sterile technique with sterile gowns and gloves were used for the procedure. A timeout was performed prior to the initiation of the procedure. Local anesthesia was provided with 1% lidocaine with epinephrine. Under direct ultrasound guidance, an 18 gauge trocar needle was advanced into the peritoneal space with the right abdominal quadrant. Appropriate positioning was confirmed with the efflux of ascites. An Amplatz super stiff wire was advanced across the abdomen under intermittent fluoroscopic guidance. The access site was dilated over the Amplatz wire, allowing advancement of a peel-away sheath. The peritoneal catheter was tunneled in an antegrade fashion from a site along the right mid clavicular line to the access site and inserted through the peel-away sheath with tip ultimately terminating within left mid hemiabdomen. Several postprocedural spot abdominal radiographs were obtained. The catheter was connected to suction canisters and approximately 4.1 L cc of serous ascites was aspirated. The access site was closed with an interrupted 4-0 Vicryl suture, Dermabond and Steri-Strips. Dressings were placed. The patient tolerated procedure well without immediate postprocedural complication. FINDINGS: After successful ultrasound and fluoroscopic guided peritoneal drainage catheter placement, the right lower quadrant abdominal approach drainage  catheter terminates within the left mid hemiabdomen. Following tunneled peritoneal drainage catheter placement, approximately 4.1 L of serous fluid was aspirated. IMPRESSION: 1. Successful ultrasound and fluoroscopic guided placement of a tunneled peritoneal drainage catheter. 2. Successful aspiration of 4.1 L of serous ascites following tunneled peritoneal drainage catheter placement. Electronically Signed   By: Sandi Mariscal M.D.   On: 01/10/2019 14:56   Dg Chest [facility]  Result Date: 01/21/2019 CLINICAL DATA:  Increased oxygen demand EXAM: PORTABLE CHEST 1 VIEW COMPARISON:  01/20/2019, 11/28/2018 FINDINGS: Right-sided central venous port tip over the cavoatrial region. No focal airspace disease or effusion. Stable cardiomediastinal silhouette. No pneumothorax. IMPRESSION: No active disease. Electronically Signed   By: Donavan Foil M.D.   On: 01/21/2019 21:44   [place] Ascites (abdomen Limited)  Result Date: 01/25/2019 CLINICAL DATA:  Malignant ascites EXAM: LIMITED ABDOMEN ULTRASOUND FOR ASCITES TECHNIQUE: Limited ultrasound survey for ascites was performed in all four abdominal quadrants. COMPARISON:  01/24/2019 FINDINGS: Moderate to large volume ascites is present despite paracentesis 1 day ago. Ascites is more prominent in the RIGHT lateral abdomen. IMPRESSION: Significant ascites. Electronically Signed   By: Lavonia Dana M.D.   On: 01/25/2019 16:57     Microbiology: Recent Results (from the past 240 hour(s))  Culture, blood (Routine x 2)     Status: None   Collection Time: 01/20/19  1:39 PM  Result Value Ref Range Status   Specimen Description BLOOD DRAWN BY RN SITE NOT SPECIFIED  Final   Special Requests   Final    BOTTLES DRAWN AEROBIC AND ANAEROBIC Blood Culture adequate volume   Culture   Final    NO GROWTH 5 DAYS Performed at [facility], [address] [place], [place] [address]    Report Status 01/25/2019 FINAL  Final  Culture, blood (Routine x 2)     Status: None   Collection  Time: 01/20/19  3:42 PM  Result Value Ref Range Status   Specimen Description RIGHT ANTECUBITAL  Final   Special Requests   Final    BOTTLES DRAWN AEROBIC AND ANAEROBIC Blood Culture adequate volume   Culture   Final    NO GROWTH 5 DAYS Performed at [facility], [address], [place], [place] [address]    Report Status 01/25/2019 FINAL  Final     Labs: Basic Metabolic Panel: Recent Labs  Lab 01/21/19 0554 01/22/19 1020 01/23/19 0600 01/24/19 0450 01/25/19 0448  NA 126* 127* 128* 130* 134*  K 6.1* 5.9* 5.0 4.2 3.7  CL 97* 96* 97* 98 101  CO2 18* 18* 21* 19* 20*  GLUCOSE 193* 154* 139* 189* 178*  BUN 68* 74* 76* 84* 79*  CREATININE 2.39* 2.46* 2.40* 2.10* 1.46*  CALCIUM 7.9* 7.8* 8.3* 9.0 9.0   Liver Function Tests: No results for input(s): AST, ALT, ALKPHOS, BILITOT, PROT, ALBUMIN in the last 168 hours. No results for input(s): LIPASE, AMYLASE in the last 168 hours. No results for input(s): AMMONIA in the last 168 hours. CBC: Recent Labs  Lab 01/22/19 1020 01/23/19 0600 01/24/19 0450 01/25/19 0448 01/03/2019 0446  WBC 10.5 7.2 7.6 7.3 8.6  HGB 8.1* 6.2* 8.1* 8.5* 9.4*  HCT 25.8* 19.7* 25.8* 26.8* 29.3*  MCV 103.2* 103.7* 94.5 94.4 93.9  PLT 18* 16* 18* 50* 39*   Cardiac Enzymes: No results for input(s): CKTOTAL, CKMB, CKMBINDEX, TROPONINI in the last 168 hours. BNP: Invalid input(s): POCBNP CBG: Recent Labs  Lab 01/25/19 1115 01/25/19 1607 01/25/19 2120 01/17/2019 0728 01/13/2019 1554  GLUCAP 259* 182* 156* 178* 171*    Time coordinating discharge:  36 minutes  Signed:  Orson Eva, DO [facility] Pager: [phone] 01/27/2019, 5:49 PM

## 2019-01-27 NOTE — Progress Notes (Signed)
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  The patient had routine blood work performed by her oncologist, and she was noted to have hyponatremia and hyperkalemia.  The patient also had progressive abdominal distention.  She normally requires paracentesis every 7 to 10 days.  Upon presentation, the patient was noted to have sodium 126 with potassium 6.6.  In addition, the patient had acute on chronic renal failure with a serum creatinine peaking at 2.46.  The patient was started on IV fluids and oral bicarbonate.  She was treated appropriately for hyperkalemia.  Since her hospitalization, the patient has been seen by palliative medicine as well as medical oncology.  Dr. Delton Coombes felt that the patient was too weak to receive any additional active therapy for her recurrent cancer at this time. Rucaparib was stopped on 01/20/2019.  We will continue discussions with palliative medicine, the patient and family have agreed to transition to focus on the patient's comfort.  Ultimately, they agreed upon placing a peritoneal catheter as a palliative measure for draining her peritoneal fluid.  IR was consulted, and the procedure was performed on 01/10/2019 with another 4.1 L drained after the procedure  Assessment/Plan: Acute on chronic renal failure--CKD stage III -Baseline creatinine 1.6-1.9 -Serum creatinine peaked 2.46 -Improved with IV fluids -After discussions with palliative medicine, the patient and spouse felt it was in the patient's best interest to transition her focus of care to full comfort to  maintain her dignity and allow for a natural death.  Hyponatremia -Secondary to volume depletion and poor solute intake -Patient was started on sodium bicarbonate by Dr. Dyann Kief -Na has improved  Malignant ascites -The patient has required paracentesis every 7-10 days -Paracentesis performed 01/24/2019--5 L removed -peritoneal catheter placement 01/11/2019 as a palliative measure -drain as needed for accumulation of malignant ascites  Metastatic high-grade serous carcinoma of peritoneum -Appreciate medical oncology consultation -No longer a candidate for further chemo/immunotherapy -Initial diagnosis June 2017 -Debulking surgery 10/14/2016--Dr. Denman George -12/10/2018 CT abdomen--extensive omental caking, peritoneal implant along the transverse colon, tumor caking along the gastrohepatic ligament and below the stomach -After discussions with palliative medicine, the patient and spouse felt it was in the patient's best interest to transition her focus of care to full comfort to maintain her dignity and allow for a natural death.  Diabetes mellitus type 2 -At this point, given the patient's clinical situation, allow for liberal glycemic control -Discontinue NovoLog sliding scale as the patient's focus of care has been transition to comfort  Thrombocytopenia -Secondary to chemo/immunotherapy and malignancy -Monitor for signs of bleeding  Anemia of chronic disease/chemotherapy associated anemia -Baseline hemoglobin 8-9 -Transfused 2 units PRBC during this admission  Generalized weakness -Multifactorial including acute on chronic renal failure, hyponatremia, anemia, and progression of underlying malignancy -No further work-up at this time as the patient's focus of care has been transition to focus on comfort  Diarrhea -prn lomotil  Hyperkalemia -treated -no longer active issue  Goals of Care -full comfort care -add morphine -discussed with spouse at bedside who is not  amendable to morphine for pain and dyspnea   Disposition Plan:  Residential hospice when bed available Family Communication:   Spouse updated at bedside 3/26  Consultants: med/onc, IR, palliative medicine    Code Status:  DNR  DVT Prophylaxis: comfort care   Procedures: As Listed in Progress Note Above      Subjective: Pt is comfortable.  She denies cp, sob, n/v/d, abd pain, f/c, headache.  Has occasional pain in buttock  Objective: Vitals:   01/27/19 0556  BP: 100/60  Pulse: (!) 108  Resp: 18  Temp: 98.2 F (36.8 C)  TempSrc: Oral  SpO2: 98%    Intake/Output Summary (Last 24 hours) at 01/27/2019 1637 Last data filed at 01/27/2019 0900 Gross per 24 hour  Intake 240 ml  Output 250 ml  Net -10 ml   Weight change:  Exam:   General:  Pt is alert, follows commands appropriately, not in acute distress  HEENT: No icterus, No thrush, No neck mass, [place]/AT  Cardiovascular: RRR, S1/S2, no rubs, no gallops  Respiratory: bibasilar rales.  Abdomen: Soft/+BS, non tender, non distended, no guarding  Extremities: trace LE edema, No lymphangitis, No petechiae, No rashes, no synovitis   Data Reviewed: I have personally reviewed following labs and imaging studies Basic Metabolic Panel: Recent Labs  Lab 01/21/19 0554 01/22/19 1020 01/23/19 0600 01/24/19 0450 01/25/19 0448  NA 126* 127* 128* 130* 134*  K 6.1* 5.9* 5.0 4.2 3.7  CL 97* 96* 97* 98 101  CO2 18* 18* 21* 19* 20*  GLUCOSE 193* 154* 139* 189* 178*  BUN 68* 74* 76* 84* 79*  CREATININE 2.39* 2.46* 2.40* 2.10* 1.46*  CALCIUM 7.9* 7.8* 8.3* 9.0 9.0   Liver Function Tests: No results for input(s): AST, ALT, ALKPHOS, BILITOT, PROT, ALBUMIN in the last 168 hours. No results for input(s): LIPASE, AMYLASE in the last 168 hours. No results for input(s): AMMONIA in the last 168 hours. Coagulation Profile: Recent Labs  Lab 01/06/2019 0814  INR 1.4*   CBC: Recent Labs  Lab 01/22/19 1020  01/23/19 0600 01/24/19 0450 01/25/19 0448 01/10/2019 0446  WBC 10.5 7.2 7.6 7.3 8.6  HGB 8.1* 6.2* 8.1* 8.5* 9.4*  HCT 25.8* 19.7* 25.8* 26.8* 29.3*  MCV 103.2* 103.7* 94.5 94.4 93.9  PLT 18* 16* 18* 50* 39*   Cardiac Enzymes: No results for input(s): CKTOTAL, CKMB, CKMBINDEX, TROPONINI in the last 168 hours. BNP: Invalid input(s): POCBNP CBG: Recent Labs  Lab 01/25/19 1115 01/25/19 1607 01/25/19 2120 01/18/2019 0728 01/10/2019 1554  GLUCAP 259* 182* 156* 178* 171*   HbA1C: No results for input(s): HGBA1C in the last 72 hours. Urine analysis:    Component Value Date/Time   COLORURINE YELLOW 11/28/2018 1430   APPEARANCEUR HAZY (A) 11/28/2018 1430   LABSPEC 1.020 11/28/2018 1430   PHURINE 5.0 11/28/2018 1430   GLUCOSEU NEGATIVE 11/28/2018 1430   HGBUR NEGATIVE 11/28/2018 1430   BILIRUBINUR NEGATIVE 11/28/2018 1430   KETONESUR NEGATIVE 11/28/2018 1430   PROTEINUR NEGATIVE 11/28/2018 1430   NITRITE NEGATIVE 11/28/2018 1430   LEUKOCYTESUR NEGATIVE 11/28/2018 1430   Sepsis Labs: @LABRCNTIP (procalcitonin:4,lacticidven:4) ) Recent Results (from the past 240 hour(s))  Culture, blood (Routine x 2)     Status: None   Collection Time: 01/20/19  1:39 PM  Result Value Ref Range Status   Specimen Description BLOOD DRAWN BY RN SITE NOT SPECIFIED  Final   Special Requests   Final    BOTTLES DRAWN AEROBIC AND ANAEROBIC Blood Culture adequate volume   Culture   Final    NO GROWTH 5 DAYS Performed at  [facility], [address] [place], [place] [address]    Report Status 01/25/2019 FINAL  Final  Culture, blood (Routine x 2)     Status: None   Collection Time: 01/20/19  3:42 PM  Result Value Ref Range Status   Specimen Description RIGHT ANTECUBITAL  Final   Special Requests   Final    BOTTLES DRAWN AEROBIC AND ANAEROBIC Blood Culture adequate volume   Culture   Final    NO GROWTH 5 DAYS Performed at [facility], [address] [place], [place] [address]    Report Status  01/25/2019 FINAL  Final     Scheduled Meds: Continuous Infusions:  Procedures/Studies: Dg Chest 2 View  Result Date: 01/20/2019 CLINICAL DATA:  68 year old female with a history of shortness of breath EXAM: CHEST - 2 VIEW COMPARISON:  11/28/2018, 09/30/2017, 08/04/2017 FINDINGS: Cardiomediastinal silhouette unchanged in size and contour. No evidence of central vascular congestion. No interlobular septal thickening. No pneumothorax or pleural effusion. Right IJ port catheter, unchanged. No confluent airspace disease.  Low lung volumes. IMPRESSION: Negative for acute cardiopulmonary disease. Right IJ port catheter. Electronically Signed   By: Corrie Mckusick D.O.   On: 01/20/2019 14:04   US Paracentesis  Result Date: 01/24/2019 INDICATION: Extra ovarian primary peritoneal carcinoma, recurrent ascites EXAM: ULTRASOUND GUIDED THERAPEUTIC PARACENTESIS MEDICATIONS: None. COMPLICATIONS: None immediate. PROCEDURE: Procedure, benefits, and risks of procedure were discussed with patient. Written informed consent for procedure was obtained. Time out protocol followed. Adequate collection of ascites localized by ultrasound in RIGHT lower quadrant. Skin prepped and draped in usual sterile fashion. Skin and soft tissues anesthetized with 10 mL of 1% lidocaine. 5 [place] Yueh catheter placed into peritoneal cavity. 5.0 L of dark red ascitic fluid aspirated by vacuum bottle suction. Procedure tolerated well by patient without immediate complication. Patient received platelet transfusion, 1 unit before and 1 unit during paracentesis. FINDINGS: As above IMPRESSION: Successful ultrasound-guided paracentesis yielding 5.0 liters of peritoneal fluid. Electronically Signed   By: Lavonia Dana M.D.   On: 01/24/2019 15:06   US Paracentesis  Result Date: 01/13/2019 INDICATION: Ascites. EXAM: ULTRASOUND GUIDED therapeutic PARACENTESIS MEDICATIONS: None. COMPLICATIONS: None immediate. PROCEDURE: Informed written consent was  obtained from the patient after a discussion of the risks, benefits and alternatives to treatment. A timeout was performed prior to the initiation of the procedure. Initial ultrasound scanning demonstrates a large amount of ascites within the left lower abdominal quadrant. The right lower abdomen was prepped and draped in the usual sterile fashion. 1% lidocaine with epinephrine was used for local anesthesia. Following this, a paracentesis catheter was introduced. An ultrasound image was saved for documentation purposes. The paracentesis was performed. The catheter was removed and a dressing was applied. The patient tolerated the procedure well without immediate post procedural complication. FINDINGS: A total of approximately 4.3 L of serous fluid was removed. IMPRESSION: Successful ultrasound-guided paracentesis yielding 4.3 liters of peritoneal fluid. Electronically Signed   By: Marijo Conception, M.D.   On: 01/13/2019 12:47   US Paracentesis  Result Date: 12/31/2018 INDICATION: History of ovarian cancer with abdominal distension from ascites. Patient presents for ultrasound-guided paracentesis. EXAM: ULTRASOUND GUIDED PARACENTESIS MEDICATIONS: Patient given an infusion of 1 dose of 25% human albumin after recovery of 6 L of peritoneal fluid. COMPLICATIONS: None immediate. PROCEDURE: Informed written consent was obtained from the patient after a discussion of the risks, benefits and alternatives to treatment. A timeout was performed prior to the initiation of the procedure. Initial ultrasound scanning demonstrates  a large amount of ascites within the right lower abdominal quadrant. The right lower abdomen was prepped and draped in the usual sterile fashion. 1% lidocaine with epinephrine was used for local anesthesia. Following this, a 19 gauge, 7-cm, Yueh catheter was introduced. An ultrasound image was saved for documentation purposes. The paracentesis was performed. The catheter was removed and a dressing was  applied. The patient tolerated the procedure well without immediate post procedural complication. FINDINGS: A total of approximately 8.3 L of straw colored fluid fluid was removed. IMPRESSION: Successful ultrasound-guided paracentesis yielding 8.3 liters of peritoneal fluid. Electronically Signed   By: Lajean Manes M.D.   On: 12/31/2018 12:27   Ir Perc Athena Masse Perit Cath [facility]  Result Date: 01/25/2019 INDICATION: History of ovarian cancer with recurrent symptomatic malignant ascites. Please perform image guided peritoneal catheter placement for palliative purposes. EXAM: ULTRASOUND AND FLUOROSCOPIC GUIDED PLACEMENT OF TUNNELED PERITONEAL CATHETER COMPARISON:  Multiple previous ultrasound-guided paracenteses most recently on 01/24/2019 yielding 5 L of peritoneal fluid. CT abdomen and pelvis - 12/10/2018 MEDICATIONS: Ancef 2 gm IV; Antibiotics were administered within an appropriate time frame prior to skin puncture. CONTRAST:  None. ANESTHESIA/SEDATION: Moderate (conscious) sedation was employed during this procedure. A total of Versed 0.5 mg was administered intravenously. Moderate Sedation Time: 16 minutes. The patient's level of consciousness and vital signs were monitored continuously by radiology nursing throughout the procedure under my direct supervision. FLUOROSCOPY TIME:  18 seconds (3 mGy) COMPLICATIONS: None immediate. TECHNIQUE: Informed written consent was obtained from the patient after a discussion of the risks, benefits and alternatives to treatment. Questions regarding the procedure were encouraged and answered. A timeout was performed prior to the initiation of the procedure. Ultrasound scanning of the right lower abdominal quadrant demonstrates a moderate amount of complex fluid within the right lower abdominal quadrant. The right lower abdomen was prepped and draped usual sterile fashion a sterile drape was applied, covering the operative table. Maximum barrier sterile technique with sterile  gowns and gloves were used for the procedure. A timeout was performed prior to the initiation of the procedure. Local anesthesia was provided with 1% lidocaine with epinephrine. Under direct ultrasound guidance, an 18 gauge trocar needle was advanced into the peritoneal space with the right abdominal quadrant. Appropriate positioning was confirmed with the efflux of ascites. An Amplatz super stiff wire was advanced across the abdomen under intermittent fluoroscopic guidance. The access site was dilated over the Amplatz wire, allowing advancement of a peel-away sheath. The peritoneal catheter was tunneled in an antegrade fashion from a site along the right mid clavicular line to the access site and inserted through the peel-away sheath with tip ultimately terminating within left mid hemiabdomen. Several postprocedural spot abdominal radiographs were obtained. The catheter was connected to suction canisters and approximately 4.1 L cc of serous ascites was aspirated. The access site was closed with an interrupted 4-0 Vicryl suture, Dermabond and Steri-Strips. Dressings were placed. The patient tolerated procedure well without immediate postprocedural complication. FINDINGS: After successful ultrasound and fluoroscopic guided peritoneal drainage catheter placement, the right lower quadrant abdominal approach drainage catheter terminates within the left mid hemiabdomen. Following tunneled peritoneal drainage catheter placement, approximately 4.1 L of serous fluid was aspirated. IMPRESSION: 1. Successful ultrasound and fluoroscopic guided placement of a tunneled peritoneal drainage catheter. 2. Successful aspiration of 4.1 L of serous ascites following tunneled peritoneal drainage catheter placement. Electronically Signed   By: Sandi Mariscal M.D.   On: 01/25/2019 14:56   Dg Chest Port 1  View  Result Date: 01/21/2019 CLINICAL DATA:  Increased oxygen demand EXAM: PORTABLE CHEST 1 VIEW COMPARISON:  01/20/2019, 11/28/2018  FINDINGS: Right-sided central venous port tip over the cavoatrial region. No focal airspace disease or effusion. Stable cardiomediastinal silhouette. No pneumothorax. IMPRESSION: No active disease. Electronically Signed   By: Donavan Foil M.D.   On: 01/21/2019 21:44   [place] Ascites (abdomen Limited)  Result Date: 01/25/2019 CLINICAL DATA:  Malignant ascites EXAM: LIMITED ABDOMEN ULTRASOUND FOR ASCITES TECHNIQUE: Limited ultrasound survey for ascites was performed in all four abdominal quadrants. COMPARISON:  01/24/2019 FINDINGS: Moderate to large volume ascites is present despite paracentesis 1 day ago. Ascites is more prominent in the RIGHT lateral abdomen. IMPRESSION: Significant ascites. Electronically Signed   By: Lavonia Dana M.D.   On: 01/25/2019 16:57    Orson Eva, DO  [facility] Pager [phone]  If 7PM-7AM, please contact night-coverage [email or web address] Password [facility] 01/27/2019, 4:37 PM   LOS: 1 day

## 2019-01-27 NOTE — H&P (Signed)
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 The patient had routine blood work performed by her oncologist, and she was noted to have hyponatremia and hyperkalemia. The patient also had progressive abdominal distention. She normally requires paracentesis every 7 to 10 days. Upon presentation, the patient was noted to have sodium 126 with potassium 6.6. In addition, the patient had acute on chronic renal failure with a serum creatinine peaking at 2.46.The patient was started on IV fluids and oral bicarbonate. She was treated appropriately for hyperkalemia. Since her hospitalization, the patient has been seen by palliative medicine as well as medical oncology. Dr. Delton Coombes felt that the patient was too weak to receive any additional active therapy for her recurrent cancer at this time. Rucaparib was stopped on 01/20/2019.We will continue discussions with palliative medicine, the patient and family have agreed to transition to focus on the patient's comfort. Ultimately, they agreed upon placing a peritoneal catheter as a palliative measure for draining her peritoneal fluid. IR was consulted, and the procedurewas performed on 01/17/2019 with another 4.1 L drained after the procedure.  Unfortunately, there was not a bed available at residential hospice [facility]  As a result, the patient was admitted to general inpatient hospice until a bed was available  Assessment/Plan: Acute on chronic renal failure--CKD stage III  -Baseline creatinine 1.6-1.9 -Serum creatinine peaked 2.46 -Improved with IV fluids -After discussions with palliative medicine, the patient and spouse felt it was in the patient's best interest to transition her focus of care to full comfort to maintain her dignity and allow for a natural death.  Hyponatremia -Secondary to volume depletion and poor solute intake -Patient was started on sodium bicarbonateby Dr. Dyann Kief -Na has improved  Malignant ascites -The patient has required paracentesis every 7-10 days -Paracentesis performed 01/24/2019--5 L removed -peritoneal catheter placement 01/16/2019 as a palliative measure -drain as needed for accumulation of malignant ascites  Metastatic high-grade serous carcinoma of peritoneum -Appreciate medical oncology consultation -No longer a candidate for further chemo/immunotherapy -Initial diagnosis June 2017 -Debulking surgery 10/14/2016--Dr. Denman George -12/10/2018 CT abdomen--extensive omental caking, peritoneal implant along the transverse colon, tumor caking along the gastrohepatic ligament and below the stomach -After discussions with palliative medicine, the patient and spouse felt it was in the patient's best interest to transition her focus of care to full comfort to maintain her dignity and allow for a natural death.  Diabetes mellitus type 2 -At this point, given the patient's clinical situation, allow for liberal glycemic control -Discontinue NovoLog sliding scale as the patient's focus of care has been transition tocomfort  Thrombocytopenia -Secondary to chemo/immunotherapy and malignancy -Monitor for signs of bleeding  Anemia of chronic disease/chemotherapy associated anemia -Baseline hemoglobin 8-9 -Transfused 2 units PRBC during this admission  Generalized weakness -Multifactorial including acute on chronic renal failure, hyponatremia, anemia, and progression of underlying malignancy -No further work-up at this time as the  patient's focus of care has been transition to focus on comfort  Diarrhea -prnlomotil  Hyperkalemia -treated -no longer active issue  Goals of Care -full comfort care -  add morphine -discussed with spouse at bedside who is not amendable to morphine for pain and dyspnea         Past Medical History:  Diagnosis Date  . Diabetes mellitus without complication (HCC)    on insulin pump  . Dysrhythmia   . 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 Family history of breast cancer   . GERD (gastroesophageal reflux disease)   . History of blood transfusion   . History of bronchitis   . History of chemotherapy   . History of urinary tract infection   . Hyperlipidemia   . Hypertension   . Low serum vitamin D   . Ovarian cancer ([place]) 05/09/2016  . Shingles    Past Surgical History:  Procedure Laterality Date  . ABDOMINAL HYSTERECTOMY  10/14/2016  . CESAREAN SECTION    . DEBULKING N/A 10/14/2016   Procedure: DEBULKING;  Surgeon: Everitt Amber, MD;  Location: [facility];  Service: Gynecology;  Laterality: N/A;  . IR PARACENTESIS  06/01/2017  . IR PERC TUN PERIT CATH WO PORT S&I Dartha Lodge  01/14/2019  . LAPAROTOMY WITH STAGING N/A 10/14/2016   Procedure: LAPAROTOMY WITH OMENTECTOMY AND TUMOR DEBULGING;  Surgeon: Everitt Amber, MD;  Location: [facility];  Service: Gynecology;  Laterality: N/A;  . LUMBAR FUSION  08/21/15   L3-L4 Dr. Timmothy Euler  . OMENTECTOMY N/A 10/14/2016   Procedure: OMENTECTOMY;  Surgeon: Everitt Amber, MD;  Location: [facility];  Service: Gynecology;  Laterality: N/A;  . ROBOTIC ASSISTED TOTAL HYSTERECTOMY WITH BILATERAL SALPINGO OOPHERECTOMY Bilateral 10/14/2016   Procedure: XI ROBOTIC ASSISTED TOTAL LAPARSCOPIC  HYSTERECTOMY WITH BILATERAL SALPINGO OOPHORECTOMY;  Surgeon: Everitt Amber, MD;  Location: [facility];  Service: Gynecology;  Laterality: Bilateral;   Social History:  reports that she has never smoked. She has never used smokeless tobacco. She reports that  she does not drink alcohol or use drugs.   Family History  Problem Relation Age of Onset  . Lung cancer Mother        smoker; dx in her 54s  . Leukemia Father   . Diabetes Paternal Grandmother   . Heart attack Paternal Grandmother   . Diabetes Paternal Grandfather   . Breast cancer Paternal Aunt        dx in her 67s-30s  . Leukemia Paternal Uncle   . Heart attack Maternal Grandfather   . Breast cancer Cousin        maternal first cousin     Allergies  Allergen Reactions  . Avandia [Rosiglitazone] Other (See Comments)    Legs swelled  . Micronase [Glyburide] Swelling  . Actos [Pioglitazone] Other (See Comments)    Edema / leg swelling     Prior to Admission medications   Medication Sig Start Date End Date Taking? Authorizing Provider  atorvastatin (LIPITOR) 40 MG tablet Take 1 tablet (40 mg total) by mouth daily. Patient not taking: Reported on 01/06/2019 10/07/18   Eustaquio Maize, MD  Biotin 1000 MCG tablet Take 1,000 mcg by mouth daily.     [provider]  furosemide (LASIX) 20 MG tablet TAKE 1 TABLET(20 MG) BY MOUTH TWICE DAILY Patient not taking: Reported on 01/28/2019 01/03/19   Francene Finders L, NP-C  glucose blood (ONE TOUCH ULTRA TEST) test strip Use to test blood sugar 6-8 times daily Patient not taking: Reported on 01/17/2019 11/01/18   Philemon Kingdom, MD  HYDROcodone-acetaminophen (NORCO) 10-325 MG tablet Take 1 tablet by mouth every 4 (four) hours as needed. Patient not taking:  Reported on 01/13/2019 01/05/19   Francene Finders L, NP-C  insulin lispro (HUMALOG) 100 UNIT/ML injection USE IN INSULIN PUMP AS DIRECTED. MAX DAILY DOSE OF 110 UNITS PER DAY. Patient not taking: Reported on 01/25/2019 09/27/18   Philemon Kingdom, MD  lidocaine-prilocaine (EMLA) cream Apply a quarter size amount to port site 1 hour prior to chemo. Do not rub in. Cover with plastic wrap. Patient not taking: Reported on 01/31/2019 12/09/17   Heath Lark, MD  magnesium oxide (MAG-OX) 400  MG tablet Take 400 mg by mouth 2 (two) times daily. Pt taking once a day    [provider]  Multiple Vitamin (MULTIVITAMIN WITH MINERALS) TABS Take 1 tablet by mouth daily.    [provider]  omeprazole (PRILOSEC) 40 MG capsule Take 1 capsule (40 mg total) by mouth daily. Patient not taking: Reported on 01/22/2019 09/15/18   Derek Jack, MD  polyethylene glycol [facility] / Floria Raveling) packet Take 17 g by mouth daily as needed for mild constipation.     [provider]  prochlorperazine (COMPAZINE) 10 MG tablet Take 1 tablet (10 mg total) by mouth every 6 (six) hours as needed for nausea or vomiting. Patient not taking: Reported on 01/27/2019 07/16/18   Heath Lark, MD  RUBRACA 300 MG tablet Take 1 tablet (300 mg total) by mouth daily. Patient not taking: Reported on 01/23/2019 01/05/19   Derek Jack, MD  vitamin B-12 (CYANOCOBALAMIN) 1000 MCG tablet Take 1,000 mcg by mouth daily.    [provider]  vitamin E (VITAMIN E) 400 UNIT capsule Take 400 Units by mouth daily.    [provider]    Review of Systems:  Limited due to patient's somnolence  Physical Exam: Vitals:   01/27/19 0556  BP: 100/60  Pulse: (!) 108  Resp: 18  Temp: 98.2 F (36.8 C)  TempSrc: Oral  SpO2: 98%   General:  Alert and awake NAD, nontoxic, pleasant/cooperative Head/Eye: No conjunctival hemorrhage, no icterus, [place]/AT, No nystagmus ENT:  No icterus,  No thrush, good dentition, no pharyngeal exudate Neck:  No masses, no lymphadenpathy, no bruits CV:  RRR, no rub, no gallop, no S3 Lung: diminished breath sounds. No wheezing Abdomen: soft/NT, +BS, nondistended, no peritoneal signs Ext: No cyanosis, No rashes, No petechiae, No lymphangitis, trace LE edema  Labs on Admission:  Basic Metabolic Panel: Recent Labs  Lab 01/21/19 0554 01/22/19 1020 01/23/19 0600 01/24/19 0450 01/25/19 0448  NA 126* 127* 128* 130* 134*  K 6.1* 5.9* 5.0 4.2 3.7  CL 97* 96*  97* 98 101  CO2 18* 18* 21* 19* 20*  GLUCOSE 193* 154* 139* 189* 178*  BUN 68* 74* 76* 84* 79*  CREATININE 2.39* 2.46* 2.40* 2.10* 1.46*  CALCIUM 7.9* 7.8* 8.3* 9.0 9.0   Liver Function Tests: No results for input(s): AST, ALT, ALKPHOS, BILITOT, PROT, ALBUMIN in the last 168 hours. No results for input(s): LIPASE, AMYLASE in the last 168 hours. No results for input(s): AMMONIA in the last 168 hours. CBC: Recent Labs  Lab 01/22/19 1020 01/23/19 0600 01/24/19 0450 01/25/19 0448 01/14/2019 0446  WBC 10.5 7.2 7.6 7.3 8.6  HGB 8.1* 6.2* 8.1* 8.5* 9.4*  HCT 25.8* 19.7* 25.8* 26.8* 29.3*  MCV 103.2* 103.7* 94.5 94.4 93.9  PLT 18* 16* 18* 50* 39*   Coagulation Profile: Recent Labs  Lab 01/28/2019 0814  INR 1.4*   Cardiac Enzymes: No results for input(s): CKTOTAL, CKMB, CKMBINDEX, TROPONINI in the last 168 hours. BNP: Invalid input(s): POCBNP CBG:  Recent Labs  Lab 01/25/19 1115 01/25/19 1607 01/25/19 2120 02/01/2019 0728 01/06/2019 1554  GLUCAP 259* 182* 156* 178* 171*   Urine analysis:    Component Value Date/Time   COLORURINE YELLOW 11/28/2018 1430   APPEARANCEUR HAZY (A) 11/28/2018 1430   LABSPEC 1.020 11/28/2018 1430   PHURINE 5.0 11/28/2018 1430   GLUCOSEU NEGATIVE 11/28/2018 1430   HGBUR NEGATIVE 11/28/2018 1430   BILIRUBINUR NEGATIVE 11/28/2018 1430   KETONESUR NEGATIVE 11/28/2018 1430   PROTEINUR NEGATIVE 11/28/2018 1430   NITRITE NEGATIVE 11/28/2018 1430   LEUKOCYTESUR NEGATIVE 11/28/2018 1430   Sepsis Labs: @LABRCNTIP (procalcitonin:4,lacticidven:4) ) Recent Results (from the past 240 hour(s))  Culture, blood (Routine x 2)     Status: None   Collection Time: 01/20/19  1:39 PM  Result Value Ref Range Status   Specimen Description BLOOD DRAWN BY RN SITE NOT SPECIFIED  Final   Special Requests   Final    BOTTLES DRAWN AEROBIC AND ANAEROBIC Blood Culture adequate volume   Culture   Final    NO GROWTH 5 DAYS Performed at [facility], [address],  [place], [place] [address]    Report Status 01/25/2019 FINAL  Final  Culture, blood (Routine x 2)     Status: None   Collection Time: 01/20/19  3:42 PM  Result Value Ref Range Status   Specimen Description RIGHT ANTECUBITAL  Final   Special Requests   Final    BOTTLES DRAWN AEROBIC AND ANAEROBIC Blood Culture adequate volume   Culture   Final    NO GROWTH 5 DAYS Performed at [facility], [address] [place], [place] [address]    Report Status 01/25/2019 FINAL  Final     Radiological Exams on Admission: [facility] [facility]  Result Date: 01/10/2019 INDICATION: History of ovarian cancer with recurrent symptomatic malignant ascites. Please perform image guided peritoneal catheter placement for palliative purposes. EXAM: ULTRASOUND AND FLUOROSCOPIC GUIDED PLACEMENT OF TUNNELED PERITONEAL CATHETER COMPARISON:  Multiple previous ultrasound-guided paracenteses most recently on 01/24/2019 yielding 5 L of peritoneal fluid. CT abdomen and pelvis - 12/10/2018 MEDICATIONS: Ancef 2 gm IV; Antibiotics were administered within an appropriate time frame prior to skin puncture. CONTRAST:  None. ANESTHESIA/SEDATION: Moderate (conscious) sedation was employed during this procedure. A total of Versed 0.5 mg was administered intravenously. Moderate Sedation Time: 16 minutes. The patient's level of consciousness and vital signs were monitored continuously by radiology nursing throughout the procedure under my direct supervision. FLUOROSCOPY TIME:  18 seconds (3 mGy) COMPLICATIONS: None immediate. TECHNIQUE: Informed written consent was obtained from the patient after a discussion of the risks, benefits and alternatives to treatment. Questions regarding the procedure were encouraged and answered. A timeout was performed prior to the initiation of the procedure. Ultrasound scanning of the right lower abdominal quadrant demonstrates a moderate amount of complex fluid within the right lower abdominal quadrant. The  right lower abdomen was prepped and draped usual sterile fashion a sterile drape was applied, covering the operative table. Maximum barrier sterile technique with sterile gowns and gloves were used for the procedure. A timeout was performed prior to the initiation of the procedure. Local anesthesia was provided with 1% lidocaine with epinephrine. Under direct ultrasound guidance, an 18 gauge trocar needle was advanced into the peritoneal space with the right abdominal quadrant. Appropriate positioning was confirmed with the efflux of ascites. An Amplatz super stiff wire was advanced across the abdomen under intermittent fluoroscopic guidance. The access site was dilated over the Amplatz  wire, allowing advancement of a peel-away sheath. The peritoneal catheter was tunneled in an antegrade fashion from a site along the right mid clavicular line to the access site and inserted through the peel-away sheath with tip ultimately terminating within left mid hemiabdomen. Several postprocedural spot abdominal radiographs were obtained. The catheter was connected to suction canisters and approximately 4.1 L cc of serous ascites was aspirated. The access site was closed with an interrupted 4-0 Vicryl suture, Dermabond and Steri-Strips. Dressings were placed. The patient tolerated procedure well without immediate postprocedural complication. FINDINGS: After successful ultrasound and fluoroscopic guided peritoneal drainage catheter placement, the right lower quadrant abdominal approach drainage catheter terminates within the left mid hemiabdomen. Following tunneled peritoneal drainage catheter placement, approximately 4.1 L of serous fluid was aspirated. IMPRESSION: 1. Successful ultrasound and fluoroscopic guided placement of a tunneled peritoneal drainage catheter. 2. Successful aspiration of 4.1 L of serous ascites following tunneled peritoneal drainage catheter placement. Electronically Signed   By: Sandi Mariscal M.D.   On:  01/17/2019 14:56        Time spent:40 minutes Code Status:   FULL COMFORT Family Communication:  Spouse updated at bedside Disposition Plan: residential hospice when bed available Consults called: none DVT Prophylaxis: FULL Comfort  Orson Eva, DO  [facility] Pager [phone]  If 7PM-7AM, please contact night-coverage [email or web address] Password [email or web address] 01/27/2019, 5:54 PM

## 2019-01-28 MED ORDER — ACETAMINOPHEN 650 MG RE SUPP
650.0000 mg | RECTAL | Status: DC
  Administered 2019-01-28 – 2019-01-29 (×5): 650 mg via RECTAL
  Filled 2019-01-28 (×5): qty 1

## 2019-01-28 MED ORDER — KETOROLAC TROMETHAMINE 15 MG/ML IJ SOLN: 15.0000 mg | Freq: Once | INTRAMUSCULAR | Status: DC

## 2019-01-28 NOTE — Progress Notes (Signed)
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 The patient had routine blood work performed by her oncologist, and she was noted to have hyponatremia and hyperkalemia. The patient also had progressive abdominal distention. She normally requires paracentesis every 7 to 10 days. Upon presentation, the patient was noted to have sodium 126 with potassium 6.6. In addition, the patient had acute on chronic renal failure with a serum creatinine peaking at 2.46.The patient was started on IV fluids and oral bicarbonate. She was treated appropriately for hyperkalemia. Since her hospitalization, the patient has been seen by palliative medicine as well as medical oncology. Dr. Delton Coombes felt that the patient was too weak to receive any additional active therapy for her recurrent cancer at this time. Rucaparib was stopped on 01/20/2019.We will continue discussions with palliative medicine, the patient and family have agreed to transition to focus on the patient's comfort. Ultimately, they agreed upon placing a peritoneal catheter as a palliative measure for draining her peritoneal fluid. IR was consulted, and the procedure was performed on 01/31/2019 with another 4.1 L drained after the procedure. Unfortunately, there was not a bed available at residential hospice in [facility]. As a result, the patient was admitted to general inpatient hospice until a bed was available  On 3/26 evening, the patient developed a fever of 102.8.  No further workup was undertaken as the patient's care was comfort centered.   The patient was started on around the clock acetaminophen  Assessment/Plan: Acute on chronic renal failure--CKD stage III -Baseline creatinine 1.6-1.9 -Serum creatinine peaked 2.46 -Improved with IV fluids -After discussions with palliative medicine, the patient and spouse felt it was in the patient's best interest to transition her focus of care to full comfort to maintain her dignity and allow for a natural death.  Hyponatremia -Secondary to volume depletion and poor solute intake -Patient was started on sodium bicarbonateby Dr. Dyann Kief -Na has improved  Malignant ascites -The patient has required paracentesis every 7-10 days -Paracentesis performed 01/24/2019--5 L removed -peritoneal catheter placed 01/03/2019 as a palliative measure -drain as needed for accumulation of malignant ascites  Metastatic high-grade serous carcinoma of peritoneum -Appreciate medical oncology consultation -No longer a candidate for further chemo/immunotherapy -Initial diagnosis June 2017 -Debulking surgery 10/14/2016--Dr. Denman George -12/10/2018 CT abdomen--extensive omental caking, peritoneal implant along the transverse colon, tumor caking along the gastrohepatic ligament and below the stomach -After discussions with palliative medicine, the patient and spouse felt it was in the patient's best interest to transition her focus of care to full comfort to maintain her dignity and allow for a natural death.  Diabetes mellitus type 2 -At this point, given the patient's clinical situation, allow for liberal glycemic control -Discontinue NovoLog sliding scale as the patient's focus of care has been transition tocomfort  Thrombocytopenia -Secondary to chemo/immunotherapy and malignancy -Monitor for signs of bleeding -no longer an active issue as pt's focus of care has been transitioned to focus on full comfort  Anemia of chronic disease/chemotherapy associated anemia -Baseline hemoglobin 8-9 -Transfused 2  units PRBC during this admission  Generalized weakness -Multifactorial including acute on chronic renal failure, hyponatremia, anemia,  and progression of underlying malignancy -No further work-up at this time as the patient's focus of care has been transition to focus on comfort  Diarrhea -prn lomotil  Hyperkalemia -treated -no longer active issue  Goals of Care -full comfort care -added morphine IV q 1 hours prn pain and dyspnea -discussed with spouse at bedside who is amendable to morphine for pain and dyspnea now   Disposition Plan:Residential hospice when bed available Family Communication:Spouse updatedat bedside 3/26  Consultants:med/onc, IR, palliative medicine  Code Status: DNR  DVT Prophylaxis:comfort care   Procedures: As Listed in Progress Note Above        Subjective: Pt is somnolent.  No reports of respiratory distress, vomiting, diarrhea, uncontrolled pain.  Intermittently opens eyes, but does not follow commands  Objective: Vitals:   01/27/19 2012 01/27/19 2100 01/28/19 0607 01/28/19 1155  BP:      Pulse:      Resp:      Temp:  (!) 102.1 F (38.9 C) (!) 101.6 F (38.7 C) (!) 103.2 F (39.6 C)  TempSrc:  Axillary Oral Axillary  SpO2: 97%       Intake/Output Summary (Last 24 hours) at 01/28/2019 1245 Last data filed at 01/28/2019 1100 Gross per 24 hour  Intake 0 ml  Output 250 ml  Net -250 ml   Weight change:  Exam:   General:  Pt is somnolent, intermittently opens eyes  HEENT: No icterus, No thrush,  [place]/AT  Cardiovascular: RRR, S1/S2, no rubs, no gallops  Respiratory: bibasilar rales  Abdomen: Soft/+BS,non distended, no guarding  Extremities: 1 + LE edema, No lymphangitis, No petechiae, No rashes, no synovitis   Data Reviewed: I have personally reviewed following labs and imaging studies Basic Metabolic Panel: Recent Labs  Lab 01/22/19 1020 01/23/19 0600 01/24/19 0450 01/25/19 0448  NA 127*  128* 130* 134*  K 5.9* 5.0 4.2 3.7  CL 96* 97* 98 101  CO2 18* 21* 19* 20*  GLUCOSE 154* 139* 189* 178*  BUN 74* 76* 84* 79*  CREATININE 2.46* 2.40* 2.10* 1.46*  CALCIUM 7.8* 8.3* 9.0 9.0   Liver Function Tests: No results for input(s): AST, ALT, ALKPHOS, BILITOT, PROT, ALBUMIN in the last 168 hours. No results for input(s): LIPASE, AMYLASE in the last 168 hours. No results for input(s): AMMONIA in the last 168 hours. Coagulation Profile: Recent Labs  Lab 01/04/2019 0814  INR 1.4*   CBC: Recent Labs  Lab 01/22/19 1020 01/23/19 0600 01/24/19 0450 01/25/19 0448 02/01/2019 0446  WBC 10.5 7.2 7.6 7.3 8.6  HGB 8.1* 6.2* 8.1* 8.5* 9.4*  HCT 25.8* 19.7* 25.8* 26.8* 29.3*  MCV 103.2* 103.7* 94.5 94.4 93.9  PLT 18* 16* 18* 50* 39*   Cardiac Enzymes: No results for input(s): CKTOTAL, CKMB, CKMBINDEX, TROPONINI in the last 168 hours. BNP: Invalid input(s): POCBNP CBG: Recent Labs  Lab 01/25/19 1115 01/25/19 1607 01/25/19 2120 01/20/2019 0728 01/25/2019 1554  GLUCAP 259* 182* 156* 178* 171*   HbA1C: No results for input(s): HGBA1C in the last 72 hours. Urine analysis:    Component Value Date/Time   COLORURINE YELLOW 11/28/2018 1430   APPEARANCEUR HAZY (A) 11/28/2018 1430   LABSPEC 1.020 11/28/2018 1430   PHURINE 5.0 11/28/2018 1430   GLUCOSEU NEGATIVE 11/28/2018 1430   HGBUR NEGATIVE 11/28/2018 1430   BILIRUBINUR NEGATIVE 11/28/2018 1430   KETONESUR NEGATIVE 11/28/2018 1430   PROTEINUR NEGATIVE 11/28/2018 1430   NITRITE NEGATIVE 11/28/2018 1430   LEUKOCYTESUR NEGATIVE 11/28/2018 1430   Sepsis Labs: @LABRCNTIP (procalcitonin:4,lacticidven:4) )  Recent Results (from the past 240 hour(s))  Culture, blood (Routine x 2)     Status: None   Collection Time: 01/20/19  1:39 PM  Result Value Ref Range Status   Specimen Description BLOOD DRAWN BY RN SITE NOT SPECIFIED  Final   Special Requests   Final    BOTTLES DRAWN AEROBIC AND ANAEROBIC Blood Culture adequate volume    Culture   Final    NO GROWTH 5 DAYS Performed at [facility], [address], [place], [place] [address]    Report Status 01/25/2019 FINAL  Final  Culture, blood (Routine x 2)     Status: None   Collection Time: 01/20/19  3:42 PM  Result Value Ref Range Status   Specimen Description RIGHT ANTECUBITAL  Final   Special Requests   Final    BOTTLES DRAWN AEROBIC AND ANAEROBIC Blood Culture adequate volume   Culture   Final    NO GROWTH 5 DAYS Performed at [facility], [address], [place], [place] [address]    Report Status 01/25/2019 FINAL  Final     Scheduled Meds:  acetaminophen  650 mg Rectal Q4H   ketorolac  15 mg Intravenous Once   Continuous Infusions:  Procedures/Studies: Dg Chest 2 View  Result Date: 01/20/2019 CLINICAL DATA:  68 year old female with a history of shortness of breath EXAM: CHEST - 2 VIEW COMPARISON:  11/28/2018, 09/30/2017, 08/04/2017 FINDINGS: Cardiomediastinal silhouette unchanged in size and contour. No evidence of central vascular congestion. No interlobular septal thickening. No pneumothorax or pleural effusion. Right IJ port catheter, unchanged. No confluent airspace disease.  Low lung volumes. IMPRESSION: Negative for acute cardiopulmonary disease. Right IJ port catheter. Electronically Signed   By: Corrie Mckusick D.O.   On: 01/20/2019 14:04   US Paracentesis  Result Date: 01/24/2019 INDICATION: Extra ovarian primary peritoneal carcinoma, recurrent ascites EXAM: ULTRASOUND GUIDED THERAPEUTIC PARACENTESIS MEDICATIONS: None. COMPLICATIONS: None immediate. PROCEDURE: Procedure, benefits, and risks of procedure were discussed with patient. Written informed consent for procedure was obtained. Time out protocol followed. Adequate collection of ascites localized by ultrasound in RIGHT lower quadrant. Skin prepped and draped in usual sterile fashion. Skin and soft tissues anesthetized with 10 mL of 1% lidocaine. 5 [place] Yueh catheter placed into peritoneal  cavity. 5.0 L of dark red ascitic fluid aspirated by vacuum bottle suction. Procedure tolerated well by patient without immediate complication. Patient received platelet transfusion, 1 unit before and 1 unit during paracentesis. FINDINGS: As above IMPRESSION: Successful ultrasound-guided paracentesis yielding 5.0 liters of peritoneal fluid. Electronically Signed   By: Lavonia Dana M.D.   On: 01/24/2019 15:06   US Paracentesis  Result Date: 01/13/2019 INDICATION: Ascites. EXAM: ULTRASOUND GUIDED therapeutic PARACENTESIS MEDICATIONS: None. COMPLICATIONS: None immediate. PROCEDURE: Informed written consent was obtained from the patient after a discussion of the risks, benefits and alternatives to treatment. A timeout was performed prior to the initiation of the procedure. Initial ultrasound scanning demonstrates a large amount of ascites within the left lower abdominal quadrant. The right lower abdomen was prepped and draped in the usual sterile fashion. 1% lidocaine with epinephrine was used for local anesthesia. Following this, a paracentesis catheter was introduced. An ultrasound image was saved for documentation purposes. The paracentesis was performed. The catheter was removed and a dressing was applied. The patient tolerated the procedure well without immediate post procedural complication. FINDINGS: A total of approximately 4.3 L of serous fluid was removed. IMPRESSION: Successful ultrasound-guided paracentesis yielding 4.3 liters of peritoneal fluid. Electronically Signed  By: Marijo Conception, M.D.   On: 01/13/2019 12:47   US Paracentesis  Result Date: 12/31/2018 INDICATION: History of ovarian cancer with abdominal distension from ascites. Patient presents for ultrasound-guided paracentesis. EXAM: ULTRASOUND GUIDED PARACENTESIS MEDICATIONS: Patient given an infusion of 1 dose of 25% human albumin after recovery of 6 L of peritoneal fluid. COMPLICATIONS: None immediate. PROCEDURE: Informed written  consent was obtained from the patient after a discussion of the risks, benefits and alternatives to treatment. A timeout was performed prior to the initiation of the procedure. Initial ultrasound scanning demonstrates a large amount of ascites within the right lower abdominal quadrant. The right lower abdomen was prepped and draped in the usual sterile fashion. 1% lidocaine with epinephrine was used for local anesthesia. Following this, a 19 gauge, 7-cm, Yueh catheter was introduced. An ultrasound image was saved for documentation purposes. The paracentesis was performed. The catheter was removed and a dressing was applied. The patient tolerated the procedure well without immediate post procedural complication. FINDINGS: A total of approximately 8.3 L of straw colored fluid fluid was removed. IMPRESSION: Successful ultrasound-guided paracentesis yielding 8.3 liters of peritoneal fluid. Electronically Signed   By: Lajean Manes M.D.   On: 12/31/2018 12:27   Ir Perc Athena Masse Perit Cath [facility]  Result Date: 01/21/2019 INDICATION: History of ovarian cancer with recurrent symptomatic malignant ascites. Please perform image guided peritoneal catheter placement for palliative purposes. EXAM: ULTRASOUND AND FLUOROSCOPIC GUIDED PLACEMENT OF TUNNELED PERITONEAL CATHETER COMPARISON:  Multiple previous ultrasound-guided paracenteses most recently on 01/24/2019 yielding 5 L of peritoneal fluid. CT abdomen and pelvis - 12/10/2018 MEDICATIONS: Ancef 2 gm IV; Antibiotics were administered within an appropriate time frame prior to skin puncture. CONTRAST:  None. ANESTHESIA/SEDATION: Moderate (conscious) sedation was employed during this procedure. A total of Versed 0.5 mg was administered intravenously. Moderate Sedation Time: 16 minutes. The patient's level of consciousness and vital signs were monitored continuously by radiology nursing throughout the procedure under my direct supervision. FLUOROSCOPY TIME:  18 seconds (3 mGy)  COMPLICATIONS: None immediate. TECHNIQUE: Informed written consent was obtained from the patient after a discussion of the risks, benefits and alternatives to treatment. Questions regarding the procedure were encouraged and answered. A timeout was performed prior to the initiation of the procedure. Ultrasound scanning of the right lower abdominal quadrant demonstrates a moderate amount of complex fluid within the right lower abdominal quadrant. The right lower abdomen was prepped and draped usual sterile fashion a sterile drape was applied, covering the operative table. Maximum barrier sterile technique with sterile gowns and gloves were used for the procedure. A timeout was performed prior to the initiation of the procedure. Local anesthesia was provided with 1% lidocaine with epinephrine. Under direct ultrasound guidance, an 18 gauge trocar needle was advanced into the peritoneal space with the right abdominal quadrant. Appropriate positioning was confirmed with the efflux of ascites. An Amplatz super stiff wire was advanced across the abdomen under intermittent fluoroscopic guidance. The access site was dilated over the Amplatz wire, allowing advancement of a peel-away sheath. The peritoneal catheter was tunneled in an antegrade fashion from a site along the right mid clavicular line to the access site and inserted through the peel-away sheath with tip ultimately terminating within left mid hemiabdomen. Several postprocedural spot abdominal radiographs were obtained. The catheter was connected to suction canisters and approximately 4.1 L cc of serous ascites was aspirated. The access site was closed with an interrupted 4-0 Vicryl suture, Dermabond and Steri-Strips. Dressings were placed. The  patient tolerated procedure well without immediate postprocedural complication. FINDINGS: After successful ultrasound and fluoroscopic guided peritoneal drainage catheter placement, the right lower quadrant abdominal approach  drainage catheter terminates within the left mid hemiabdomen. Following tunneled peritoneal drainage catheter placement, approximately 4.1 L of serous fluid was aspirated. IMPRESSION: 1. Successful ultrasound and fluoroscopic guided placement of a tunneled peritoneal drainage catheter. 2. Successful aspiration of 4.1 L of serous ascites following tunneled peritoneal drainage catheter placement. Electronically Signed   By: Sandi Mariscal M.D.   On: 01/18/2019 14:56   Dg Chest Port 1 View  Result Date: 01/21/2019 CLINICAL DATA:  Increased oxygen demand EXAM: PORTABLE CHEST 1 VIEW COMPARISON:  01/20/2019, 11/28/2018 FINDINGS: Right-sided central venous port tip over the cavoatrial region. No focal airspace disease or effusion. Stable cardiomediastinal silhouette. No pneumothorax. IMPRESSION: No active disease. Electronically Signed   By: Donavan Foil M.D.   On: 01/21/2019 21:44   [place] Ascites (abdomen Limited)  Result Date: 01/25/2019 CLINICAL DATA:  Malignant ascites EXAM: LIMITED ABDOMEN ULTRASOUND FOR ASCITES TECHNIQUE: Limited ultrasound survey for ascites was performed in all four abdominal quadrants. COMPARISON:  01/24/2019 FINDINGS: Moderate to large volume ascites is present despite paracentesis 1 day ago. Ascites is more prominent in the RIGHT lateral abdomen. IMPRESSION: Significant ascites. Electronically Signed   By: Lavonia Dana M.D.   On: 01/25/2019 16:57    Orson Eva, DO  [facility] Pager [phone]  If 7PM-7AM, please contact night-coverage [email or web address] Password [email or web address] 01/28/2019, 12:45 PM   LOS: 2 days

## 2019-01-29 ENCOUNTER — Encounter: Payer: Self-pay | Admitting: Hematology and Oncology

## 2019-02-02 NOTE — Death Summary Note (Signed)
DEATH SUMMARY   Patient Details  Name: Ellen Hunt MRN: [id number] DOB: 1951/07/09  Admission/Discharge Information   Admit Date:  02/08/19  Date of Death: Date of Death: 11-Feb-2019  Time of Death: Time of Death: 0835  Length of Stay: 3  Referring Physician: Eustaquio Maize, MD (Inactive)   Reason(s) for Hospitalization  Generalized weakness; failure to thrive  Diagnoses  Preliminary cause of death:  Secondary Diagnoses (including complications and co-morbidities):  Acute on chronic renal failure--CKD stage III -Baseline creatinine 1.6-1.9 -Serum creatinine peaked 2.46 -Improved with IV fluids -After discussions with palliative medicine, the patient and spouse felt it was in the patient's best interest to transition her focus of care to full comfort to maintain her dignity and allow for a natural death.  Hyponatremia -Secondary to volume depletion and poor solute intake -Patient was started on sodium bicarbonateby Dr. Dyann Kief -Na has improved  Malignant ascites -The patient has required paracentesis every 7-10 days -Paracentesis performed 01/24/2019--5 L removed -peritoneal catheter placed 02/08/19 as a palliative measure -drain as needed for accumulation of malignant ascites  Metastatic high-grade serous carcinoma of peritoneum -Appreciate medical oncology consultation -No longer a candidate for further chemo/immunotherapy -Initial diagnosis June 2017 -Debulking surgery 10/14/2016--Dr. Denman George -12/10/2018 CT abdomen--extensive omental caking, peritoneal implant along the transverse colon, tumor caking along the gastrohepatic ligament and below the stomach -After discussions with palliative medicine, the patient and spouse felt it was in the patient's best interest to transition her focus of care to full comfort to maintain her dignity and allow for a natural death.  Diabetes mellitus type 2 -At this point, given the patient's clinical situation, allow for liberal  glycemic control -Discontinue NovoLog sliding scale as the patient's focus of care has been transition tocomfort  Thrombocytopenia -Secondary to chemo/immunotherapy and malignancy -Monitor for signs of bleeding -no longer an active issue as pt's focus of care has been transitioned to focus on full comfort  Anemia of chronic disease/chemotherapy associated anemia -Baseline hemoglobin 8-9 -Transfused 2 units PRBC during this admission  Generalized weakness -Multifactorial including acute on chronic renal failure, hyponatremia, anemia, and progression of underlying malignancy -No further work-up at this time as the patient's focus of care has been transition to focus on comfort  Diarrhea -prnlomotil  Hyperkalemia -treated -no longer active issue  Goals of Care -full comfort care -added morphine IV q 1 hours prn pain and dyspnea -discussed with spouse at bedside who is amendable to morphine for pain and dyspnea now   Brief Hospital Course (including significant findings, care, treatment, and services provided and events leading to death)  [name] is a 68 year old female with a history of 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 The patient had routine blood work performed by her oncologist, and she was noted to have hyponatremia and hyperkalemia. The patient also had progressive abdominal distention. She normally requires paracentesis every 7 to 10 days. Upon presentation, the patient was noted to have sodium 126 with potassium 6.6. In addition, the patient had acute on chronic renal failure with a serum creatinine peaking at 2.46.The patient was started on IV fluids and oral bicarbonate. She was treated appropriately for  hyperkalemia. Since her hospitalization, the patient has been seen by palliative medicine as well as medical oncology. Dr. Delton Coombes felt that the patient was too  weak to receive any additional active therapy for her recurrent cancer at this time. Rucaparib was stopped on 01/20/2019.We will continue discussions with palliative medicine, the patient and family have agreed to transition to focus on the patient's comfort. Ultimately, they agreed upon placing a peritoneal catheter as a palliative measure for draining her peritoneal fluid. IR was consulted, and the procedurewas performed on 02/01/2019 with another 4.1 L drained after the procedure. Unfortunately, there was not a bed available at residential hospice in [facility]. As a result, the patient was admitted to general inpatient hospice until a bed was available  On 3/26 evening, the patient developed a fever of 102.8.  No further workup was undertaken as the patient's care was comfort centered.  The patient was started on around the clock acetaminophen.  The patient remained comfortable without distress.  She expired on 19-Feb-2019 at 0835.    Pertinent Labs and Studies  Significant Diagnostic Studies Dg Chest 2 View  Result Date: 01/20/2019 CLINICAL DATA:  68 year old female with a history of shortness of breath EXAM: CHEST - 2 VIEW COMPARISON:  11/28/2018, 09/30/2017, 08/04/2017 FINDINGS: Cardiomediastinal silhouette unchanged in size and contour. No evidence of central vascular congestion. No interlobular septal thickening. No pneumothorax or pleural effusion. Right IJ port catheter, unchanged. No confluent airspace disease.  Low lung volumes. IMPRESSION: Negative for acute cardiopulmonary disease. Right IJ port catheter. Electronically Signed   By: Corrie Mckusick D.O.   On: 01/20/2019 14:04   US Paracentesis  Result Date: 01/24/2019 INDICATION: Extra ovarian primary peritoneal carcinoma, recurrent ascites EXAM: ULTRASOUND GUIDED  THERAPEUTIC PARACENTESIS MEDICATIONS: None. COMPLICATIONS: None immediate. PROCEDURE: Procedure, benefits, and risks of procedure were discussed with patient. Written informed consent for procedure was obtained. Time out protocol followed. Adequate collection of ascites localized by ultrasound in RIGHT lower quadrant. Skin prepped and draped in usual sterile fashion. Skin and soft tissues anesthetized with 10 mL of 1% lidocaine. 5 [place] Yueh catheter placed into peritoneal cavity. 5.0 L of dark red ascitic fluid aspirated by vacuum bottle suction. Procedure tolerated well by patient without immediate complication. Patient received platelet transfusion, 1 unit before and 1 unit during paracentesis. FINDINGS: As above IMPRESSION: Successful ultrasound-guided paracentesis yielding 5.0 liters of peritoneal fluid. Electronically Signed   By: Lavonia Dana M.D.   On: 01/24/2019 15:06   US Paracentesis  Result Date: 01/13/2019 INDICATION: Ascites. EXAM: ULTRASOUND GUIDED therapeutic PARACENTESIS MEDICATIONS: None. COMPLICATIONS: None immediate. PROCEDURE: Informed written consent was obtained from the patient after a discussion of the risks, benefits and alternatives to treatment. A timeout was performed prior to the initiation of the procedure. Initial ultrasound scanning demonstrates a large amount of ascites within the left lower abdominal quadrant. The right lower abdomen was prepped and draped in the usual sterile fashion. 1% lidocaine with epinephrine was used for local anesthesia. Following this, a paracentesis catheter was introduced. An ultrasound image was saved for documentation purposes. The paracentesis was performed. The catheter was removed and a dressing was applied. The patient tolerated the procedure well without immediate post procedural complication. FINDINGS: A total of approximately 4.3 L of serous fluid was removed. IMPRESSION: Successful ultrasound-guided paracentesis yielding 4.3 liters of  peritoneal fluid. Electronically Signed   By: Marijo Conception, M.D.   On: 01/13/2019 12:47   US Paracentesis  Result Date: 12/31/2018 INDICATION: History of ovarian cancer with abdominal distension from ascites. Patient presents for ultrasound-guided paracentesis. EXAM: ULTRASOUND GUIDED PARACENTESIS MEDICATIONS: Patient given an infusion of 1 dose of 25% human  albumin after recovery of 6 L of peritoneal fluid. COMPLICATIONS: None immediate. PROCEDURE: Informed written consent was obtained from the patient after a discussion of the risks, benefits and alternatives to treatment. A timeout was performed prior to the initiation of the procedure. Initial ultrasound scanning demonstrates a large amount of ascites within the right lower abdominal quadrant. The right lower abdomen was prepped and draped in the usual sterile fashion. 1% lidocaine with epinephrine was used for local anesthesia. Following this, a 19 gauge, 7-cm, Yueh catheter was introduced. An ultrasound image was saved for documentation purposes. The paracentesis was performed. The catheter was removed and a dressing was applied. The patient tolerated the procedure well without immediate post procedural complication. FINDINGS: A total of approximately 8.3 L of straw colored fluid fluid was removed. IMPRESSION: Successful ultrasound-guided paracentesis yielding 8.3 liters of peritoneal fluid. Electronically Signed   By: Lajean Manes M.D.   On: 12/31/2018 12:27   Ir Perc Athena Masse Perit Cath [facility]  Result Date: 01/12/2019 INDICATION: History of ovarian cancer with recurrent symptomatic malignant ascites. Please perform image guided peritoneal catheter placement for palliative purposes. EXAM: ULTRASOUND AND FLUOROSCOPIC GUIDED PLACEMENT OF TUNNELED PERITONEAL CATHETER COMPARISON:  Multiple previous ultrasound-guided paracenteses most recently on 01/24/2019 yielding 5 L of peritoneal fluid. CT abdomen and pelvis - 12/10/2018 MEDICATIONS: Ancef 2 gm IV;  Antibiotics were administered within an appropriate time frame prior to skin puncture. CONTRAST:  None. ANESTHESIA/SEDATION: Moderate (conscious) sedation was employed during this procedure. A total of Versed 0.5 mg was administered intravenously. Moderate Sedation Time: 16 minutes. The patient's level of consciousness and vital signs were monitored continuously by radiology nursing throughout the procedure under my direct supervision. FLUOROSCOPY TIME:  18 seconds (3 mGy) COMPLICATIONS: None immediate. TECHNIQUE: Informed written consent was obtained from the patient after a discussion of the risks, benefits and alternatives to treatment. Questions regarding the procedure were encouraged and answered. A timeout was performed prior to the initiation of the procedure. Ultrasound scanning of the right lower abdominal quadrant demonstrates a moderate amount of complex fluid within the right lower abdominal quadrant. The right lower abdomen was prepped and draped usual sterile fashion a sterile drape was applied, covering the operative table. Maximum barrier sterile technique with sterile gowns and gloves were used for the procedure. A timeout was performed prior to the initiation of the procedure. Local anesthesia was provided with 1% lidocaine with epinephrine. Under direct ultrasound guidance, an 18 gauge trocar needle was advanced into the peritoneal space with the right abdominal quadrant. Appropriate positioning was confirmed with the efflux of ascites. An Amplatz super stiff wire was advanced across the abdomen under intermittent fluoroscopic guidance. The access site was dilated over the Amplatz wire, allowing advancement of a peel-away sheath. The peritoneal catheter was tunneled in an antegrade fashion from a site along the right mid clavicular line to the access site and inserted through the peel-away sheath with tip ultimately terminating within left mid hemiabdomen. Several postprocedural spot abdominal  radiographs were obtained. The catheter was connected to suction canisters and approximately 4.1 L cc of serous ascites was aspirated. The access site was closed with an interrupted 4-0 Vicryl suture, Dermabond and Steri-Strips. Dressings were placed. The patient tolerated procedure well without immediate postprocedural complication. FINDINGS: After successful ultrasound and fluoroscopic guided peritoneal drainage catheter placement, the right lower quadrant abdominal approach drainage catheter terminates within the left mid hemiabdomen. Following tunneled peritoneal drainage catheter placement, approximately 4.1 L of serous fluid was aspirated. IMPRESSION: 1.  Successful ultrasound and fluoroscopic guided placement of a tunneled peritoneal drainage catheter. 2. Successful aspiration of 4.1 L of serous ascites following tunneled peritoneal drainage catheter placement. Electronically Signed   By: Sandi Mariscal M.D.   On: 01/04/2019 14:56   Dg Chest Port 1 View  Result Date: 01/21/2019 CLINICAL DATA:  Increased oxygen demand EXAM: PORTABLE CHEST 1 VIEW COMPARISON:  01/20/2019, 11/28/2018 FINDINGS: Right-sided central venous port tip over the cavoatrial region. No focal airspace disease or effusion. Stable cardiomediastinal silhouette. No pneumothorax. IMPRESSION: No active disease. Electronically Signed   By: Donavan Foil M.D.   On: 01/21/2019 21:44   [place] Ascites (abdomen Limited)  Result Date: 01/25/2019 CLINICAL DATA:  Malignant ascites EXAM: LIMITED ABDOMEN ULTRASOUND FOR ASCITES TECHNIQUE: Limited ultrasound survey for ascites was performed in all four abdominal quadrants. COMPARISON:  01/24/2019 FINDINGS: Moderate to large volume ascites is present despite paracentesis 1 day ago. Ascites is more prominent in the RIGHT lateral abdomen. IMPRESSION: Significant ascites. Electronically Signed   By: Lavonia Dana M.D.   On: 01/25/2019 16:57    Microbiology Recent Results (from the past 240 hour(s))  Culture,  blood (Routine x 2)     Status: None   Collection Time: 01/20/19  1:39 PM  Result Value Ref Range Status   Specimen Description BLOOD DRAWN BY RN SITE NOT SPECIFIED  Final   Special Requests   Final    BOTTLES DRAWN AEROBIC AND ANAEROBIC Blood Culture adequate volume   Culture   Final    NO GROWTH 5 DAYS Performed at [facility], [address], [place], [place] [address]    Report Status 01/25/2019 FINAL  Final  Culture, blood (Routine x 2)     Status: None   Collection Time: 01/20/19  3:42 PM  Result Value Ref Range Status   Specimen Description RIGHT ANTECUBITAL  Final   Special Requests   Final    BOTTLES DRAWN AEROBIC AND ANAEROBIC Blood Culture adequate volume   Culture   Final    NO GROWTH 5 DAYS Performed at [facility], [address] [place], [place] [address]    Report Status 01/25/2019 FINAL  Final    Lab Basic Metabolic Panel: Recent Labs  Lab 01/22/19 1020 01/23/19 0600 01/24/19 0450 01/25/19 0448  NA 127* 128* 130* 134*  K 5.9* 5.0 4.2 3.7  CL 96* 97* 98 101  CO2 18* 21* 19* 20*  GLUCOSE 154* 139* 189* 178*  BUN 74* 76* 84* 79*  CREATININE 2.46* 2.40* 2.10* 1.46*  CALCIUM 7.8* 8.3* 9.0 9.0   Liver Function Tests: No results for input(s): AST, ALT, ALKPHOS, BILITOT, PROT, ALBUMIN in the last 168 hours. No results for input(s): LIPASE, AMYLASE in the last 168 hours. No results for input(s): AMMONIA in the last 168 hours. CBC: Recent Labs  Lab 01/22/19 1020 01/23/19 0600 01/24/19 0450 01/25/19 0448 01/02/2019 0446  WBC 10.5 7.2 7.6 7.3 8.6  HGB 8.1* 6.2* 8.1* 8.5* 9.4*  HCT 25.8* 19.7* 25.8* 26.8* 29.3*  MCV 103.2* 103.7* 94.5 94.4 93.9  PLT 18* 16* 18* 50* 39*   Cardiac Enzymes: No results for input(s): CKTOTAL, CKMB, CKMBINDEX, TROPONINI in the last 168 hours. Sepsis Labs: Recent Labs  Lab 01/23/19 0600 01/24/19 0450 01/25/19 0448 01/24/2019 0446  WBC 7.2 7.6 7.3 8.6    Procedures/Operations  none   Saxton Chain 15-Feb-2019, 9:25  AM

## 2019-02-02 NOTE — Progress Notes (Addendum)
Patient died at 9. Allowed family as much time as they desired with patient. [facility] [employer] and prepared body by removing all lines for funeral home pick up.

## 2019-02-02 DEATH — deceased

## 2019-03-14 ENCOUNTER — Ambulatory Visit: Payer: Medicare Other | Admitting: Internal Medicine

## 2019-12-24 IMAGING — DX DG CHEST 2V
2 series · 2 of 2 positions shown · non-contrast
Comparison: Body CT 09/13/2018

CLINICAL DATA: Urinary retention.  Shortness of breath.

EXAM:
CHEST - 2 VIEW

[chest lat]
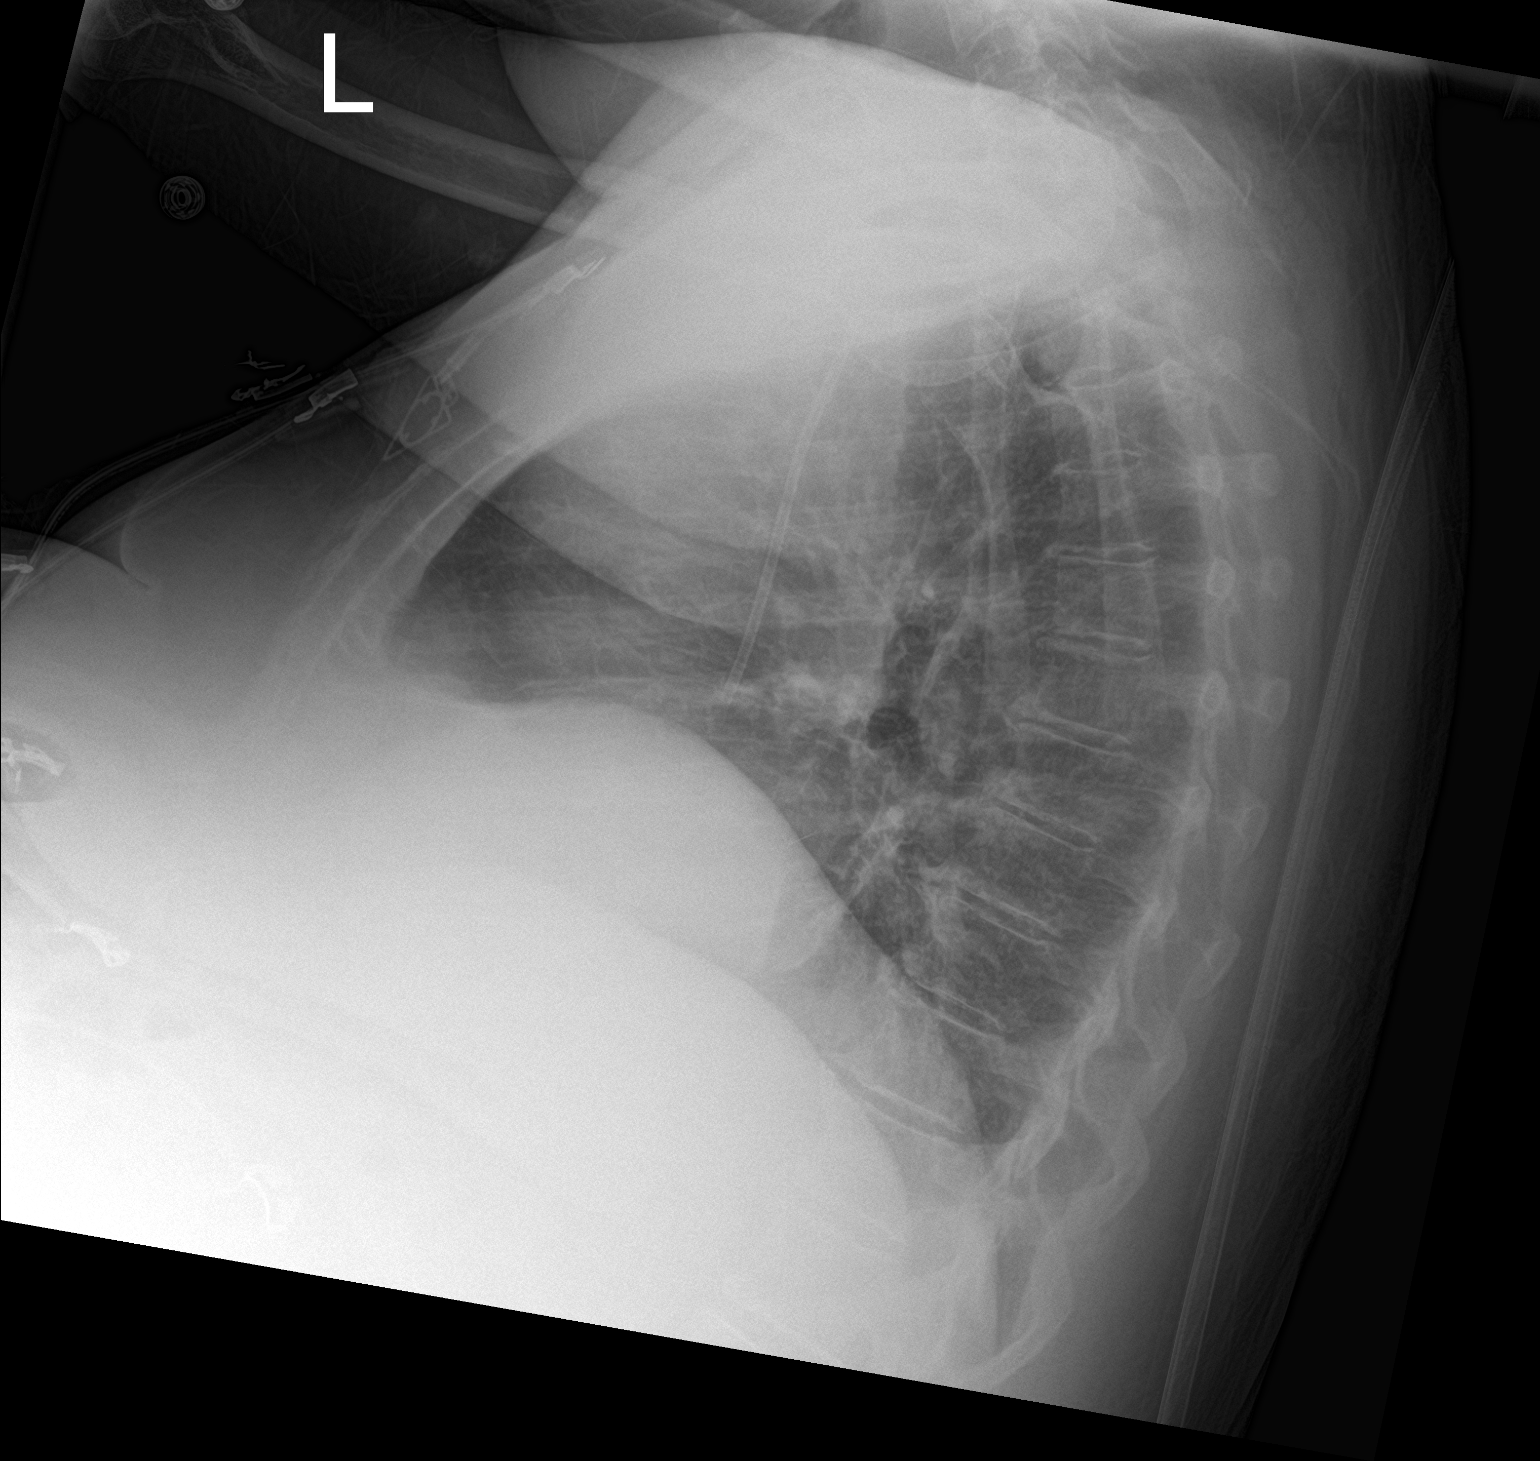

[chest ap]
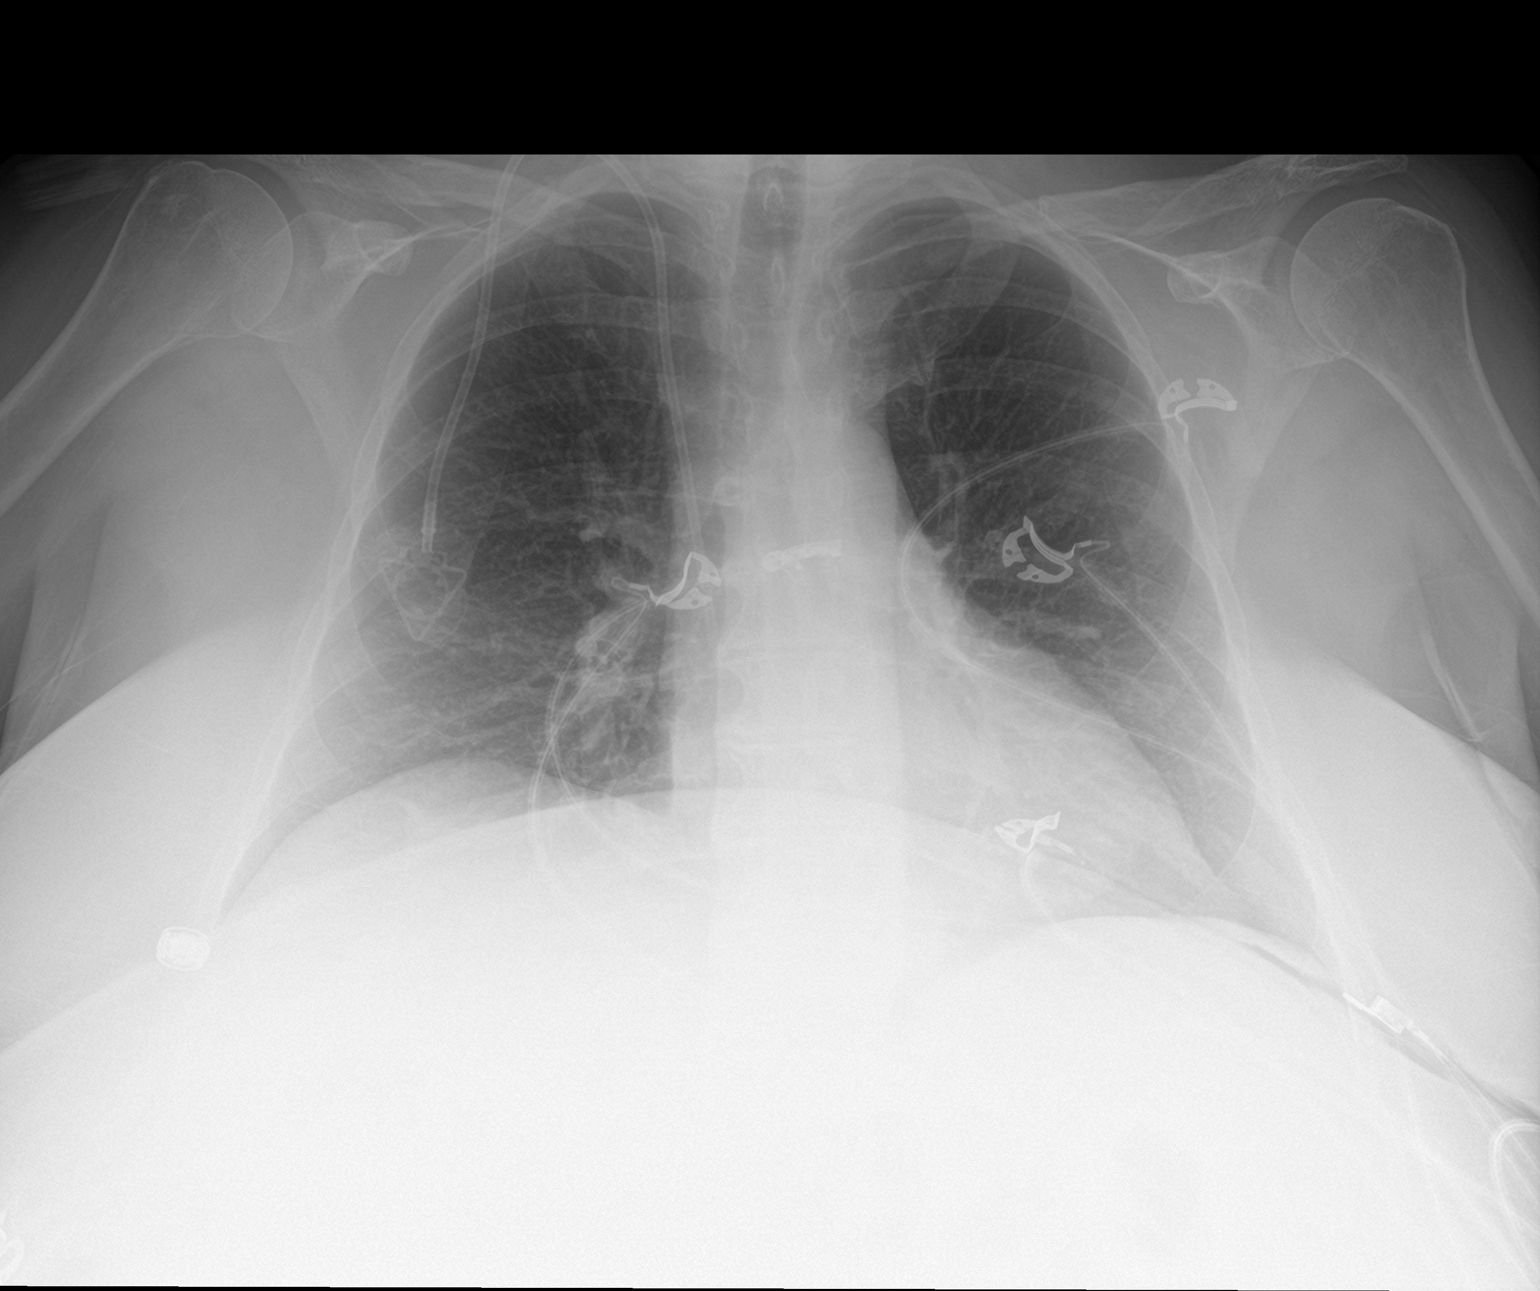

[2 of 2 positions shown; findings below may reference images not displayed]

FINDINGS: Injectable port terminates at the cavoatrial junction.

Cardiomediastinal silhouette is normal. Mediastinal contours appear
intact.

There is no evidence of focal airspace consolidation, pleural
effusion or pneumothorax.

Osseous structures are without acute abnormality. Soft tissues are
grossly normal.
IMPRESSION: No active cardiopulmonary disease.

## 2020-01-26 IMAGING — US US PARACENTESIS
1 series · 3 of 3 positions shown · non-contrast
Comparison: none

INDICATION: History of ovarian cancer with abdominal distension from ascites.
Patient presents for ultrasound-guided paracentesis.

[Series 1: us paracentesis · 3 of 3 slices shown]
[im 1/3]
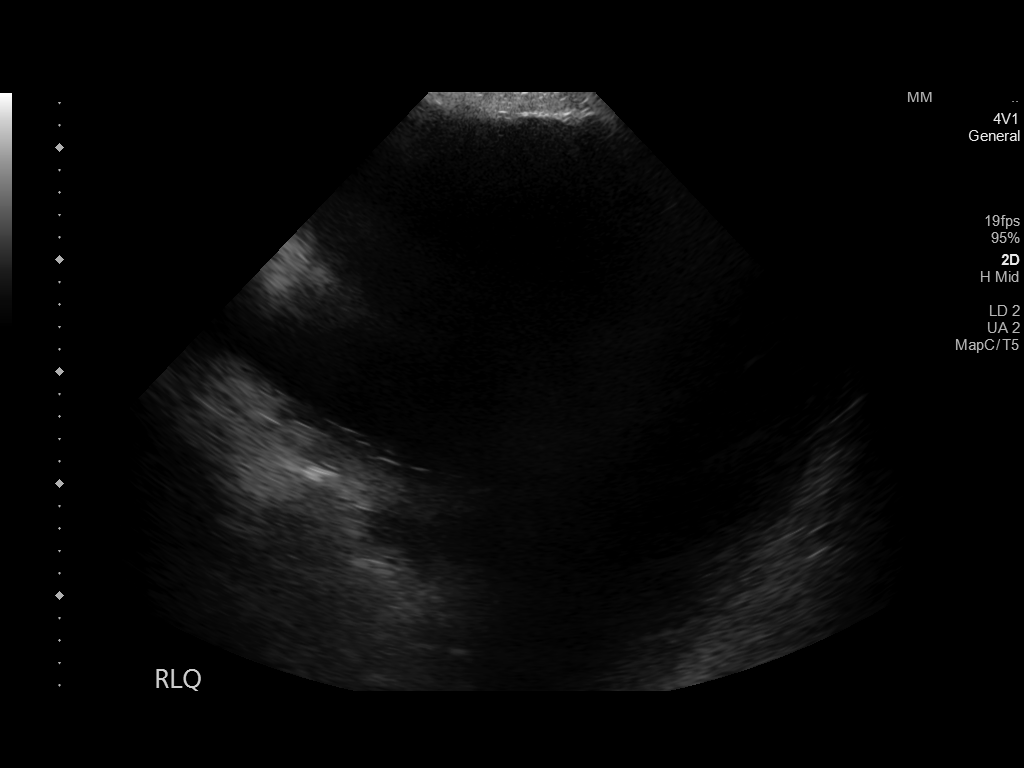
[im 2/3]
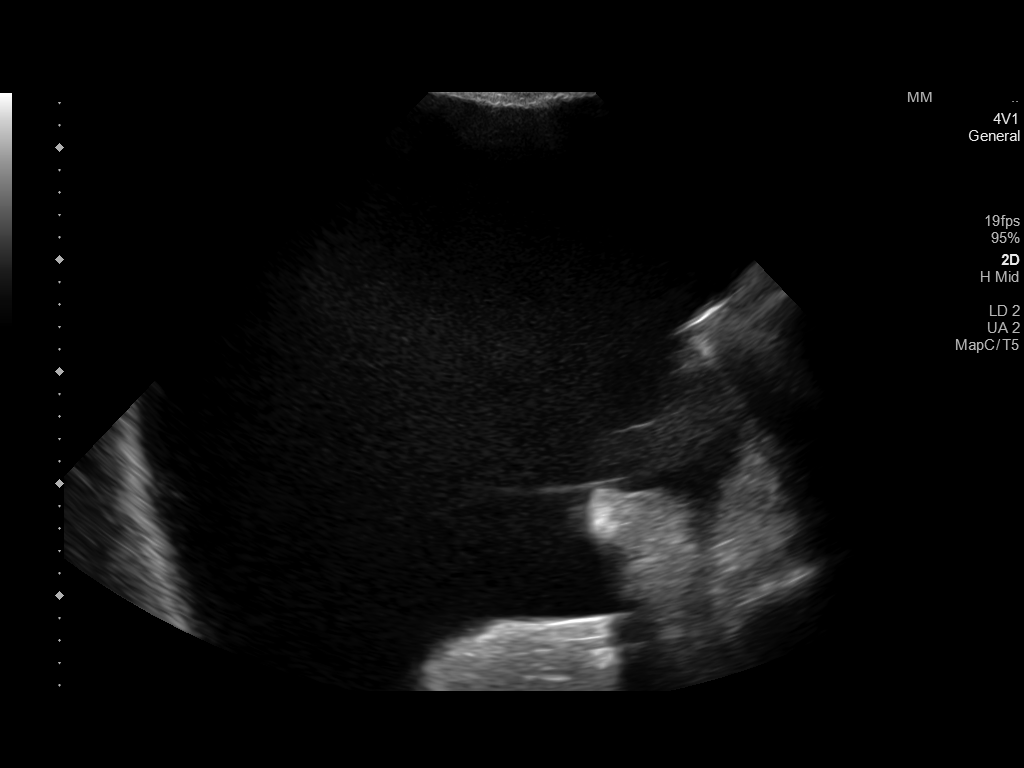
[im 3/3]
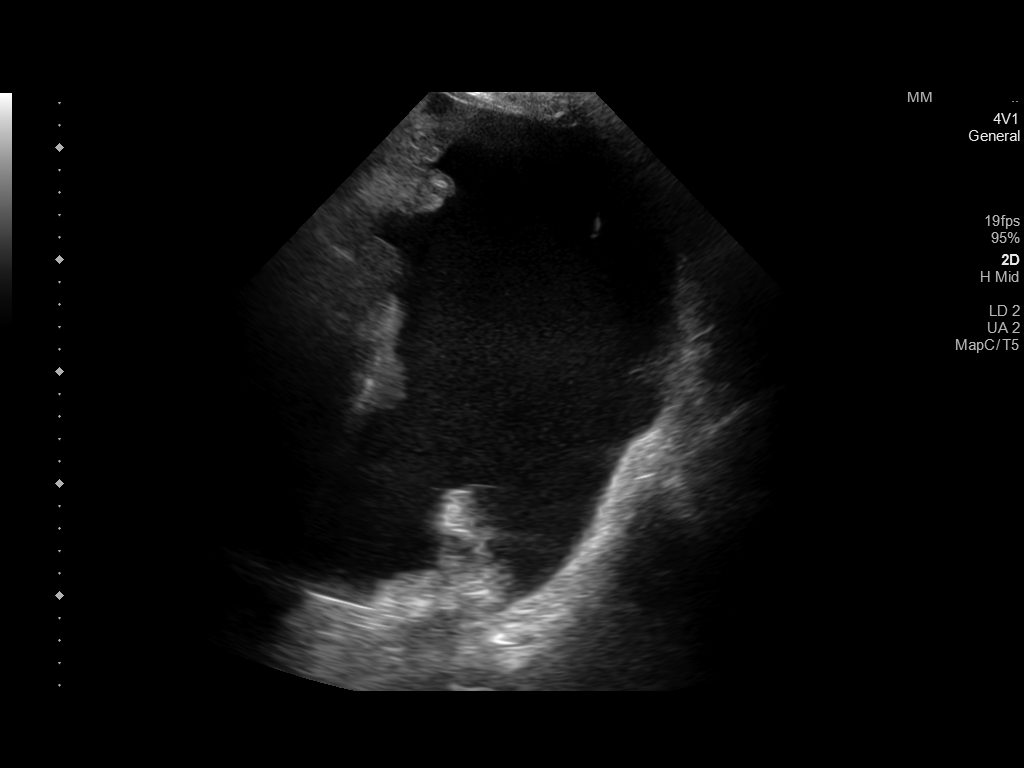

[3 of 3 positions shown; findings below may reference images not displayed]

EXAM:
ULTRASOUND GUIDED PARACENTESIS

MEDICATIONS:
Patient given an infusion of 1 dose of 25% human albumin after
recovery of 6 L of peritoneal fluid.

COMPLICATIONS:
None immediate.

PROCEDURE:
Informed written consent was obtained from the patient after a
discussion of the risks, benefits and alternatives to treatment. A
timeout was performed prior to the initiation of the procedure.

Initial ultrasound scanning demonstrates a large amount of ascites
within the right lower abdominal quadrant. The right lower abdomen
was prepped and draped in the usual sterile fashion. 1% lidocaine
with epinephrine was used for local anesthesia.

Following this, a 19 gauge, 7-cm, Yueh catheter was introduced. An
ultrasound image was saved for documentation purposes. The
paracentesis was performed. The catheter was removed and a dressing
was applied. The patient tolerated the procedure well without
immediate post procedural complication.
FINDINGS: A total of approximately 8.3 L of straw colored fluid fluid was
removed.
IMPRESSION: Successful ultrasound-guided paracentesis yielding 8.3 liters of
peritoneal fluid.

## 2020-02-19 IMAGING — US PARACENTESIS WITH ULTRASOUND GUIDANCE
1 series · 1 of 1 positions shown · non-contrast
Comparison: none

INDICATION: Extra ovarian primary peritoneal carcinoma, recurrent ascites

[Series 1: paracentesis with ultrasound guidance · 1 of 1 slices shown]
[im 1/1]
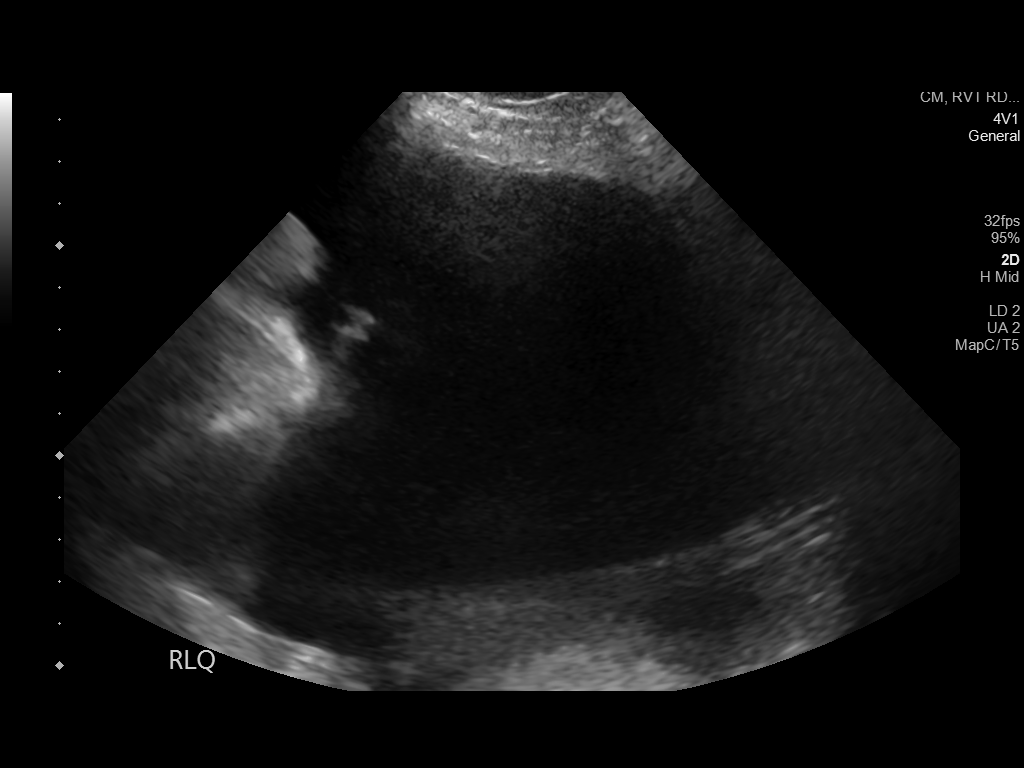

[1 of 1 positions shown; findings below may reference images not displayed]

EXAM:
ULTRASOUND GUIDED THERAPEUTIC PARACENTESIS

MEDICATIONS:
None.

COMPLICATIONS:
None immediate.

PROCEDURE:
Procedure, benefits, and risks of procedure were discussed with
patient.

Written informed consent for procedure was obtained.

Time out protocol followed.

Adequate collection of ascites localized by ultrasound in RIGHT
lower quadrant.

Skin prepped and draped in usual sterile fashion.

Skin and soft tissues anesthetized with 10 mL of 1% lidocaine.

5 [place] Yueh catheter placed into peritoneal cavity.

5.0 L of dark red ascitic fluid aspirated by vacuum bottle suction.

Procedure tolerated well by patient without immediate complication.

Patient received platelet transfusion, 1 unit before and 1 unit
during paracentesis.
FINDINGS: As above
IMPRESSION: Successful ultrasound-guided paracentesis yielding 5.0 liters of
peritoneal fluid.

## 2020-02-20 IMAGING — US ULTRASOUND ABDOMEN LIMITED
1 series · 4 of 4 positions shown · non-contrast
Comparison: 01/24/2019

CLINICAL DATA: Malignant ascites

EXAM:
LIMITED ABDOMEN ULTRASOUND FOR ASCITES
TECHNIQUE: Limited ultrasound survey for ascites was performed in all four
abdominal quadrants.

[Series 1: ultrasound abdomen limited · 4 of 4 slices shown]
[im 1/4]
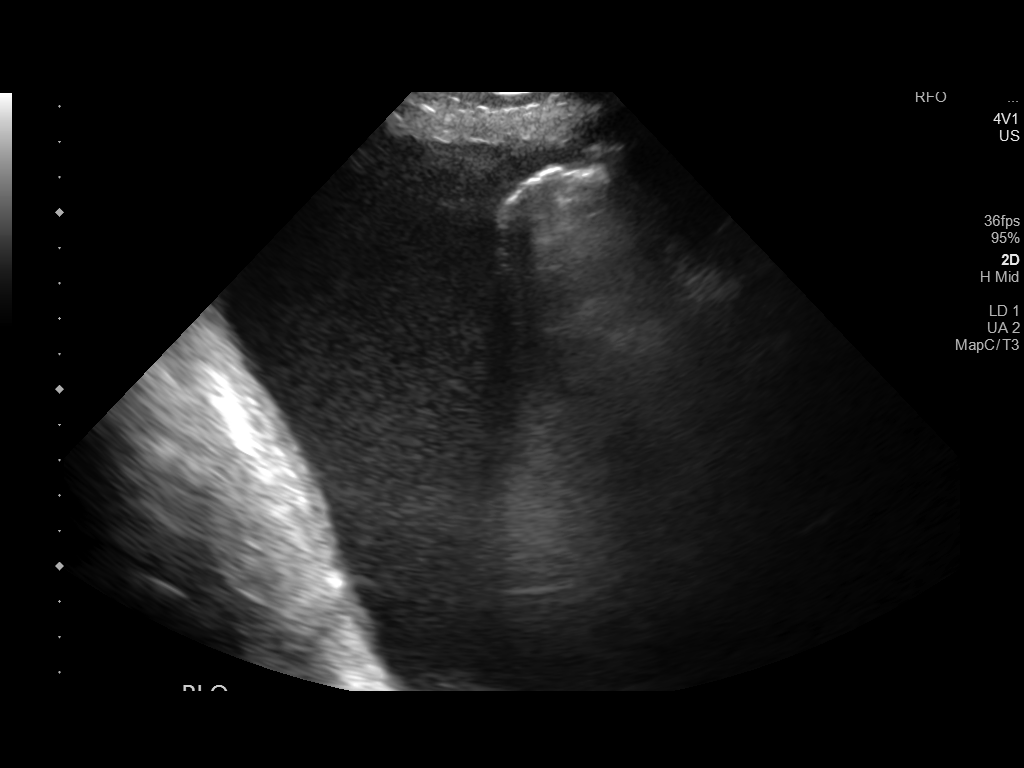
[im 2/4]
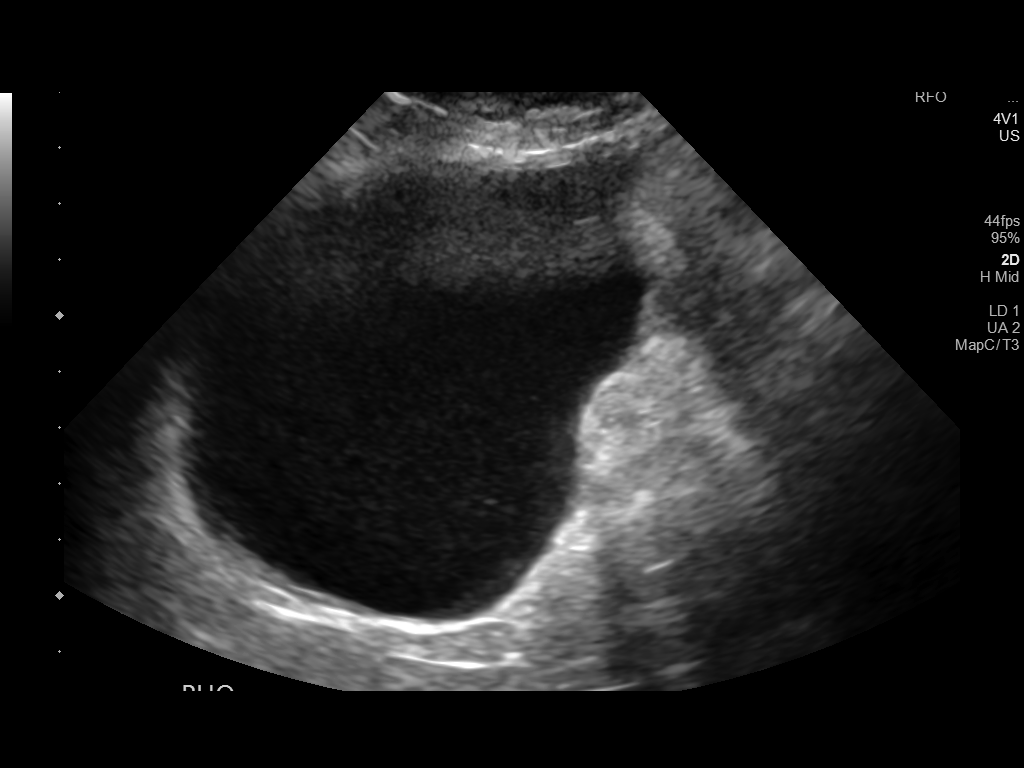
[im 3/4]
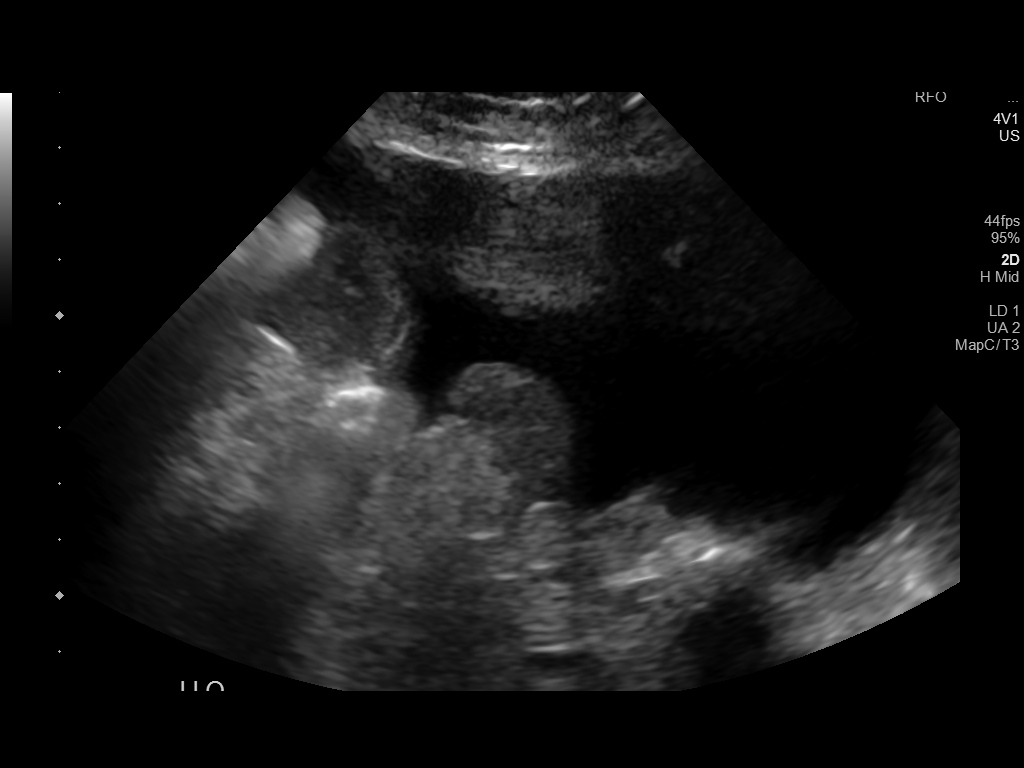
[im 4/4]
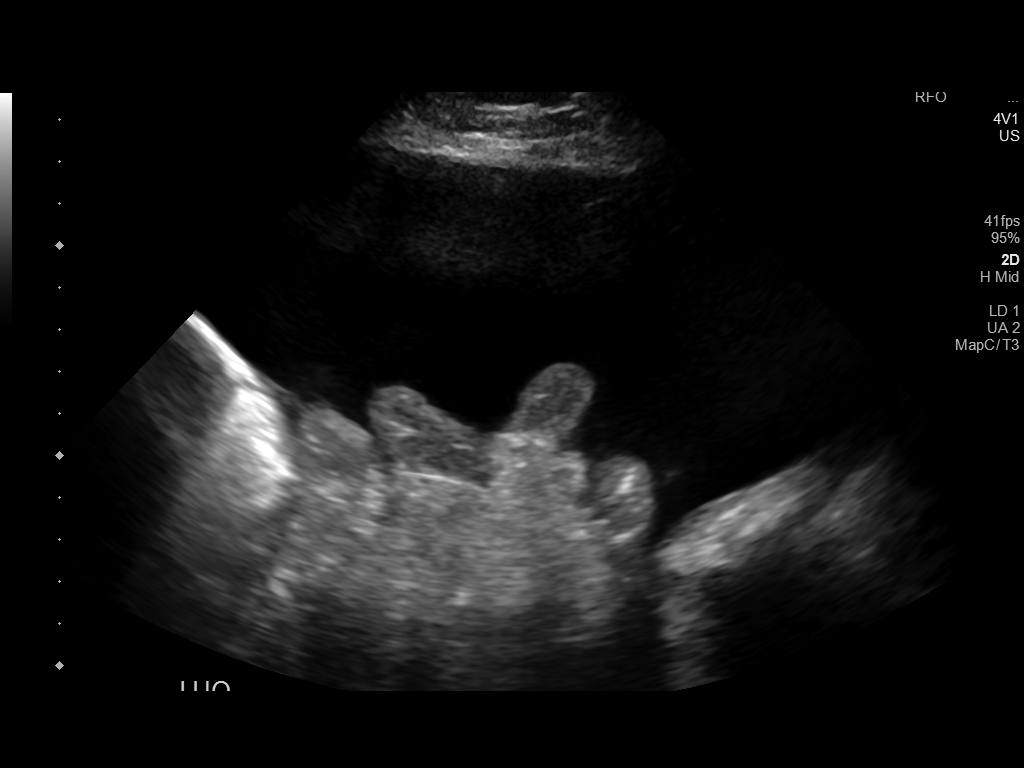

[4 of 4 positions shown; findings below may reference images not displayed]

FINDINGS: Moderate to large volume ascites is present despite paracentesis 1
day ago.

Ascites is more prominent in the RIGHT lateral abdomen.
IMPRESSION: Significant ascites.
